# Patient Record
Sex: Male | Born: 1960 | Race: Black or African American | Hispanic: No | Marital: Single | Smoking: Never smoker
Health system: Southern US, Community
[De-identification: ages and names within clinical notes are randomized; demographics above are authoritative.]

## PROBLEM LIST (undated history)

## (undated) DIAGNOSIS — I5042 Chronic combined systolic (congestive) and diastolic (congestive) heart failure: Secondary | ICD-10-CM

## (undated) DIAGNOSIS — J151 Pneumonia due to Pseudomonas: Secondary | ICD-10-CM

## (undated) DIAGNOSIS — R011 Cardiac murmur, unspecified: Secondary | ICD-10-CM

## (undated) DIAGNOSIS — I359 Nonrheumatic aortic valve disorder, unspecified: Secondary | ICD-10-CM

## (undated) DIAGNOSIS — I4892 Unspecified atrial flutter: Secondary | ICD-10-CM

## (undated) DIAGNOSIS — E119 Type 2 diabetes mellitus without complications: Secondary | ICD-10-CM

## (undated) DIAGNOSIS — Z91199 Patient's noncompliance with other medical treatment and regimen due to unspecified reason: Secondary | ICD-10-CM

## (undated) DIAGNOSIS — R570 Cardiogenic shock: Secondary | ICD-10-CM

## (undated) DIAGNOSIS — N183 Chronic kidney disease, stage 3 unspecified: Secondary | ICD-10-CM

## (undated) DIAGNOSIS — J9600 Acute respiratory failure, unspecified whether with hypoxia or hypercapnia: Secondary | ICD-10-CM

## (undated) DIAGNOSIS — M109 Gout, unspecified: Secondary | ICD-10-CM

## (undated) DIAGNOSIS — K250 Acute gastric ulcer with hemorrhage: Secondary | ICD-10-CM

## (undated) DIAGNOSIS — D509 Iron deficiency anemia, unspecified: Secondary | ICD-10-CM

## (undated) DIAGNOSIS — E669 Obesity, unspecified: Secondary | ICD-10-CM

## (undated) DIAGNOSIS — Z9119 Patient's noncompliance with other medical treatment and regimen: Secondary | ICD-10-CM

## (undated) DIAGNOSIS — U071 COVID-19: Secondary | ICD-10-CM

## (undated) DIAGNOSIS — I10 Essential (primary) hypertension: Secondary | ICD-10-CM

## (undated) DIAGNOSIS — E785 Hyperlipidemia, unspecified: Secondary | ICD-10-CM

## (undated) DIAGNOSIS — I1310 Hypertensive heart and chronic kidney disease without heart failure, with stage 1 through stage 4 chronic kidney disease, or unspecified chronic kidney disease: Secondary | ICD-10-CM

## (undated) DIAGNOSIS — I272 Pulmonary hypertension, unspecified: Secondary | ICD-10-CM

## (undated) DIAGNOSIS — G4700 Insomnia, unspecified: Secondary | ICD-10-CM

## (undated) HISTORY — DX: Hypertensive heart and chronic kidney disease without heart failure, with stage 1 through stage 4 chronic kidney disease, or unspecified chronic kidney disease: I13.10

## (undated) HISTORY — DX: Hyperlipidemia, unspecified: E78.5

## (undated) HISTORY — DX: Patient's noncompliance with other medical treatment and regimen: Z91.19

## (undated) HISTORY — DX: Pulmonary hypertension, unspecified: I27.20

## (undated) HISTORY — DX: Essential (primary) hypertension: I10

## (undated) HISTORY — DX: Nonrheumatic aortic valve disorder, unspecified: I35.9

## (undated) HISTORY — DX: Chronic combined systolic (congestive) and diastolic (congestive) heart failure: I50.42

## (undated) HISTORY — DX: Chronic kidney disease, stage 3 unspecified: N18.30

## (undated) HISTORY — DX: Insomnia, unspecified: G47.00

## (undated) HISTORY — DX: Iron deficiency anemia, unspecified: D50.9

## (undated) HISTORY — DX: Obesity, unspecified: E66.9

## (undated) HISTORY — DX: Patient's noncompliance with other medical treatment and regimen due to unspecified reason: Z91.199

## (undated) HISTORY — DX: Unspecified atrial flutter: I48.92

## (undated) HISTORY — DX: Type 2 diabetes mellitus without complications: E11.9

## (undated) HISTORY — DX: Cardiogenic shock: R57.0

## (undated) HISTORY — DX: Cardiac murmur, unspecified: R01.1

## (undated) HISTORY — DX: Gout, unspecified: M10.9

---
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
## 2004-07-24 ENCOUNTER — Emergency Department (HOSPITAL_COMMUNITY): Admission: EM | Admit: 2004-07-24 | Discharge: 2004-07-24 | Payer: Self-pay | Admitting: Family Medicine

## 2004-09-29 ENCOUNTER — Ambulatory Visit (HOSPITAL_COMMUNITY): Admission: RE | Admit: 2004-09-29 | Discharge: 2004-09-29 | Payer: Self-pay | Admitting: Gastroenterology

## 2004-11-05 ENCOUNTER — Emergency Department (HOSPITAL_COMMUNITY): Admission: EM | Admit: 2004-11-05 | Discharge: 2004-11-05 | Payer: Self-pay | Admitting: Family Medicine

## 2013-09-09 ENCOUNTER — Ambulatory Visit: Payer: Self-pay | Admitting: Emergency Medicine

## 2014-11-17 ENCOUNTER — Encounter: Payer: Self-pay | Admitting: *Deleted

## 2014-11-19 ENCOUNTER — Encounter: Payer: Self-pay | Admitting: Cardiology

## 2014-11-19 ENCOUNTER — Ambulatory Visit (INDEPENDENT_AMBULATORY_CARE_PROVIDER_SITE_OTHER): Payer: Medicaid Other | Admitting: Cardiology

## 2014-11-19 VITALS — BP 150/80 | HR 74 | Ht 71.0 in | Wt 232.0 lb

## 2014-11-19 DIAGNOSIS — N189 Chronic kidney disease, unspecified: Secondary | ICD-10-CM

## 2014-11-19 DIAGNOSIS — I350 Nonrheumatic aortic (valve) stenosis: Secondary | ICD-10-CM

## 2014-11-19 DIAGNOSIS — I5022 Chronic systolic (congestive) heart failure: Secondary | ICD-10-CM | POA: Diagnosis not present

## 2014-11-19 DIAGNOSIS — I4892 Unspecified atrial flutter: Secondary | ICD-10-CM | POA: Diagnosis not present

## 2014-11-19 DIAGNOSIS — I10 Essential (primary) hypertension: Secondary | ICD-10-CM | POA: Diagnosis not present

## 2014-11-19 DIAGNOSIS — I420 Dilated cardiomyopathy: Secondary | ICD-10-CM

## 2014-11-19 DIAGNOSIS — N182 Chronic kidney disease, stage 2 (mild): Secondary | ICD-10-CM | POA: Insufficient documentation

## 2014-11-19 DIAGNOSIS — I351 Nonrheumatic aortic (valve) insufficiency: Secondary | ICD-10-CM | POA: Insufficient documentation

## 2014-11-19 DIAGNOSIS — R0602 Shortness of breath: Secondary | ICD-10-CM

## 2014-11-19 HISTORY — DX: Essential (primary) hypertension: I10

## 2014-11-19 LAB — CBC WITH DIFFERENTIAL/PLATELET
BASOS ABS: 0.1 10*3/uL (ref 0.0–0.1)
Basophils Relative: 0.6 % (ref 0.0–3.0)
EOS ABS: 0.2 10*3/uL (ref 0.0–0.7)
Eosinophils Relative: 1.9 % (ref 0.0–5.0)
HCT: 40.7 % (ref 39.0–52.0)
HEMOGLOBIN: 13.3 g/dL (ref 13.0–17.0)
Lymphocytes Relative: 14.4 % (ref 12.0–46.0)
Lymphs Abs: 1.5 10*3/uL (ref 0.7–4.0)
MCHC: 32.7 g/dL (ref 30.0–36.0)
MCV: 82.3 fl (ref 78.0–100.0)
MONOS PCT: 6.1 % (ref 3.0–12.0)
Monocytes Absolute: 0.7 10*3/uL (ref 0.1–1.0)
NEUTROS ABS: 8.3 10*3/uL — AB (ref 1.4–7.7)
Neutrophils Relative %: 77 % (ref 43.0–77.0)
PLATELETS: 411 10*3/uL — AB (ref 150.0–400.0)
RBC: 4.95 Mil/uL (ref 4.22–5.81)
RDW: 15.1 % (ref 11.5–15.5)
WBC: 10.8 10*3/uL — ABNORMAL HIGH (ref 4.0–10.5)

## 2014-11-19 LAB — BASIC METABOLIC PANEL
BUN: 10 mg/dL (ref 6–23)
CO2: 26 mEq/L (ref 19–32)
CREATININE: 1.3 mg/dL (ref 0.4–1.5)
Calcium: 9.1 mg/dL (ref 8.4–10.5)
Chloride: 101 mEq/L (ref 96–112)
GFR: 72.13 mL/min (ref >60.00)
GLUCOSE: 99 mg/dL (ref 70–99)
POTASSIUM: 3.6 meq/L (ref 3.5–5.1)
Sodium: 137 mEq/L (ref 135–145)

## 2014-11-19 LAB — BRAIN NATRIURETIC PEPTIDE: PRO B NATRI PEPTIDE: 837 pg/mL — AB (ref 0.0–100.0)

## 2014-11-19 MED ORDER — CARVEDILOL 25 MG PO TABS
25.0000 mg | ORAL_TABLET | Freq: Two times a day (BID) | ORAL | Status: DC
Start: 2014-11-19 — End: 2015-04-23

## 2014-11-19 NOTE — Progress Notes (Signed)
[address] [place], [place]  [address] Phone: [phone] Fax:  [phone]  Date:  11/19/2014   ID:  [name], DOB 12-31-1960, MRN [id number]  PCP:  No primary care provider on file.  Cardiologist:  Jonathon Him, MD    History of Present Illness: [name] is a 53 y.o. male with a history of CHF, atrial flutter, AV disease with AS, HTN, CKD and DCM who recently was seen at urgent care for increased SOB and CHF.  This past October he was admitted to a hospital in [place] and a BNP was 2522.  Those records are unavailable at the time of this OV.  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  He has never had a heart cath.  He was in [facility] in [place] as well.  He has been in [facility] several times.  He has never had a heart catheterization done. He says that his SOB has improved.  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  He denies any chest pain or pressure.  He denies any palpitations, dizziness or syncope.  He has 3 pillow orthopnea but no PND.   Wt Readings from Last 3 Encounters:  11/19/14 232 lb (105.235 kg)     Past Medical History  Diagnosis Date  . Insomnia   . CHF (congestive heart failure)   . Congenital insufficiency of aortic valve   . Congestive cardiomyopathy   . Atrial flutter   . Benign hypertensive heart and renal disease   . Essential hypertension, malignant   . SOB (shortness of breath)   . Murmur     Current Outpatient Prescriptions  Medication Sig Dispense Refill  . furosemide (LASIX) 40 MG tablet Take 40 mg by mouth 2 (two) times daily.    [name] albuterol (PROVENTIL HFA;VENTOLIN HFA) 108 (90 BASE) MCG/ACT inhaler Inhale 2 puffs into the lungs every 4 (four) hours as needed for wheezing or shortness of breath.    [name] aspirin 81 MG tablet Take 81 mg by mouth daily.    [name] atorvastatin (LIPITOR) 40 MG tablet Take 40 mg by mouth daily.    . carvedilol (COREG)  12.5 MG tablet Take 12.5 mg by mouth 2 (two) times daily with a meal.    . clopidogrel (PLAVIX) 75 MG tablet Take 75 mg by mouth daily.    . hydrALAZINE (APRESOLINE) 25 MG tablet Take 25 mg by mouth 3 (three) times daily.    [name] lisinopril (PRINIVIL,ZESTRIL) 10 MG tablet Take 10 mg by mouth 2 (two) times daily.    . ranitidine (ZANTAC) 150 MG capsule Take 150 mg by mouth daily.    . vitamin B-12 (CYANOCOBALAMIN) 500 MCG tablet Take 500 mcg by mouth daily.     No current facility-administered medications for this visit.    Allergies:   No Known Allergies  Social History:  The patient  reports that he has never smoked. He does not have any smokeless tobacco history on file.   Family History:  The patient's family history is negative for Heart attack.   ROS:  Please see the history of present illness.      All other systems reviewed and negative.   PHYSICAL EXAM: VS:  BP 150/80 mmHg  Pulse 74  Ht 5\' 11"  (1.803 m)  Wt 232 lb (105.235 kg)  BMI 32.37 kg/m2 Well nourished, well developed, in no  acute distress HEENT: normal Neck: no JVD Cardiac:  normal S1, S2; RRR;2/6 SM at RUSB to LLSB Lungs:  clear to auscultation bilaterally, no wheezing, rhonchi or rales Abd: soft, nontender, no hepatomegaly Ext: no edema Skin: warm and dry Neuro:  CNs 2-12 intact, no focal abnormalities noted  EKG:   Atrial flutter with variable block at 75bpm with LVH and QRS widening and repolarization abnormality    ASSESSMENT AND PLAN:  1. Atrial flutter rate controlled on Coreg.  He is on plavix and not a NOAC for unclear reasons.  His CHADS2VASC score is 2 (HTN and CHF).  I think he should be on a NOAC so I will check a NOAC panel today to determine his creatinine and also get his old records to try to figure out why he was not started on NOAC to begin with.  He has no history of bleeding problems. 2. HTN - borderline control.  Increase Coreg to 25mg  BID.  Continue Lisinopril/hydralazine 3. AV disease with  AS - I do not know the degree of AS and will need to get his hospital records for [place].  He clearly has a systolic murmur of AS on exam. 4. DCM and chronic systolic CHF - again I do not have any records and will need to get these to determine the etiology of this ( HTN vs. Valvular heart disease).  He continue to have SOB although improved on diuretics.  His lungs are clear and he has minimal LE edema on exam.  Continue lasix/BB and ACE I and check a BNP. 5. CKD - again I do not have records - will check BMET today.  Followup with me in 4 weeks    Signed, Jonathon Him, MD [facility] 11/19/2014 9:52 AM

## 2014-11-19 NOTE — Patient Instructions (Addendum)
Your physician has recommended you make the following change in your medication:  1) INCREASE Coreg to 25 mg twice daily  Your physician recommends that you have lab work TODAY (BNP, CBC, BMET).  Your physician recommends that you schedule a follow-up appointment in: 4 weeks with Dr. Radford Pax.

## 2014-11-20 ENCOUNTER — Telehealth: Payer: Self-pay

## 2014-11-20 DIAGNOSIS — I5022 Chronic systolic (congestive) heart failure: Secondary | ICD-10-CM

## 2014-11-20 MED ORDER — POTASSIUM CHLORIDE CRYS ER 20 MEQ PO TBCR
20.0000 meq | EXTENDED_RELEASE_TABLET | Freq: Every day | ORAL | Status: DC
Start: 2014-11-20 — End: 2018-12-10

## 2014-11-20 NOTE — Telephone Encounter (Signed)
[place] for medical records from [place], no medical records found at this time.  Ewell Poe RN

## 2014-11-20 NOTE — Telephone Encounter (Signed)
-----   Message from Sueanne Margarita, MD sent at 11/20/2014  9:55 AM EST ----- Please let patient know that his BNP is elevated consistent with acute CHF.  Please have him increase lasix to 40mg  PO 2 tablets twice daily for 3 days and then go back to 1 tablet BID. Start Kdur 31meq PO daily.   Recheck BNP and BMET in 1 week.  Please let him know his kidney function is normal.  Please find out if we have received the medical records from [place] that we requested yesterday to make sure there are no contraindications to starting patient on a NOAC for atrial flutter

## 2014-11-20 NOTE — Telephone Encounter (Signed)
Called patient back to inform him of his new medication orders. Patient stated "I can't afford to take any more medications. Why are you calling me so late?" Informed patient that Dr. Radford Pax had given orders at 1551 and he was contacted at 1553. Message was left to call office at that time. Patient was contacted again at 1720 due to the importance of medication changes and patient's condition. Patient sounded agitated and tired. I gave patient medication instructions as Dr. Radford Pax had written them: Increase lasix to 40 mg by mouth two tablets twice daily for three days and then go back to one tablet twice daily. Start Potassium 20 meq by mouth daily.  Patient was not agreeable to changes. Informed patient that Dr. Radford Pax wanted to recheck his labs in a week, and asked patient to come in next Thursday for labs. Patient stated "How am I going to get there? I have no car?" Patient continued to raise his voice and complain about cost. Apology was given to patient for waking him up, but patient was unconsolable. Asked patient to call office with any other questions. Sent patient's pharmacy the prescription for the potassium just in case he changes his mind about taking the medication.  Ewell Poe RN

## 2014-11-23 NOTE — Telephone Encounter (Signed)
Records here.  Will place with Dr. Theodosia Blender chart

## 2014-11-23 NOTE — Telephone Encounter (Signed)
We need a copy of his records today please!

## 2014-11-24 ENCOUNTER — Telehealth: Payer: Self-pay | Admitting: Cardiology

## 2014-11-24 NOTE — Telephone Encounter (Signed)
New Message  Pt called asking about lab work. Please call back and discuss.

## 2014-11-24 NOTE — Telephone Encounter (Signed)
ROI Faxed to [employer]

## 2014-11-24 NOTE — Telephone Encounter (Signed)
Please let patient know that I reviewed the records from [facility] where he presented with acute systolic CHF, acute hypoxic respiratory failure from pulmonary edema and acute renal failure.  He has a history of type II DM and HTN. .  His echo showed severely dilated LV with severe LV dysfunction EF 20%, moderate LVH, severe LAE, mild to moderate MR, bicuspid AV with severe AS/AR.  He was also started on coumadin for atrial flutter but this was stopped due to worries about noncompliance and he was started on Plavix.  He was also noted to have an elevated troponin on admission there and cardiology was consulted but he did not have a cath.  He was seen by cardiac surgery as well who recommended AVR but patient wanted to come back to [place] for this.  I have recommended that we set patient up for a right and left heart cath to assess for CAD prior to undergoing AVR. I will repeat an echo here for cardiac surgeons to review. I will stop his Plavix in preparation for need for AVR.  He will need to be placed on anticoagulants after surgery given his high CHADS2VASC score.  Continue ASA/Coreg/statin/lasix/ACE I.  I have attempted to call patient but could not get in touch with him.  Please call him to get this set up ASAP.

## 2014-11-25 NOTE — Telephone Encounter (Signed)
Left message to call back ASAP.

## 2014-11-25 NOTE — Telephone Encounter (Signed)
Addressed in lab notes

## 2014-11-26 ENCOUNTER — Other Ambulatory Visit (INDEPENDENT_AMBULATORY_CARE_PROVIDER_SITE_OTHER): Payer: Medicaid Other | Admitting: *Deleted

## 2014-11-26 ENCOUNTER — Other Ambulatory Visit: Payer: Self-pay

## 2014-11-26 DIAGNOSIS — Z01812 Encounter for preprocedural laboratory examination: Secondary | ICD-10-CM

## 2014-11-26 DIAGNOSIS — I420 Dilated cardiomyopathy: Secondary | ICD-10-CM

## 2014-11-26 DIAGNOSIS — R0602 Shortness of breath: Secondary | ICD-10-CM

## 2014-11-26 DIAGNOSIS — I350 Nonrheumatic aortic (valve) stenosis: Secondary | ICD-10-CM

## 2014-11-26 DIAGNOSIS — I5022 Chronic systolic (congestive) heart failure: Secondary | ICD-10-CM | POA: Diagnosis not present

## 2014-11-26 DIAGNOSIS — I4892 Unspecified atrial flutter: Secondary | ICD-10-CM

## 2014-11-26 LAB — BASIC METABOLIC PANEL
BUN: 19 mg/dL (ref 6–23)
CO2: 28 mEq/L (ref 19–32)
Calcium: 9.1 mg/dL (ref 8.4–10.5)
Chloride: 100 mEq/L (ref 96–112)
Creatinine, Ser: 1.6 mg/dL — ABNORMAL HIGH (ref 0.4–1.5)
GFR: 59.56 mL/min — AB (ref >60.00)
Glucose, Bld: 115 mg/dL — ABNORMAL HIGH (ref 70–99)
POTASSIUM: 4.6 meq/L (ref 3.5–5.1)
SODIUM: 135 meq/L (ref 135–145)

## 2014-11-26 LAB — CBC WITH DIFFERENTIAL/PLATELET
BASOS PCT: 0.7 % (ref 0.0–3.0)
Basophils Absolute: 0.1 10*3/uL (ref 0.0–0.1)
Eosinophils Absolute: 0.2 10*3/uL (ref 0.0–0.7)
Eosinophils Relative: 2.4 % (ref 0.0–5.0)
HCT: 38.3 % — ABNORMAL LOW (ref 39.0–52.0)
HEMOGLOBIN: 12.5 g/dL — AB (ref 13.0–17.0)
Lymphocytes Relative: 14.5 % (ref 12.0–46.0)
Lymphs Abs: 1.2 10*3/uL (ref 0.7–4.0)
MCHC: 32.7 g/dL (ref 30.0–36.0)
MCV: 81.3 fl (ref 78.0–100.0)
MONOS PCT: 5.9 % (ref 3.0–12.0)
Monocytes Absolute: 0.5 10*3/uL (ref 0.1–1.0)
NEUTROS ABS: 6.4 10*3/uL (ref 1.4–7.7)
Neutrophils Relative %: 76.5 % (ref 43.0–77.0)
Platelets: 296 10*3/uL (ref 150.0–400.0)
RBC: 4.72 Mil/uL (ref 4.22–5.81)
RDW: 15.6 % — ABNORMAL HIGH (ref 11.5–15.5)
WBC: 8.4 10*3/uL (ref 4.0–10.5)

## 2014-11-26 LAB — APTT: aPTT: 29.9 s (ref 23.4–32.7)

## 2014-11-26 LAB — PROTIME-INR
INR: 1.4 ratio — ABNORMAL HIGH (ref 0.8–1.0)
Prothrombin Time: 15.7 s — ABNORMAL HIGH (ref 9.6–13.1)

## 2014-11-26 LAB — BRAIN NATRIURETIC PEPTIDE: PRO B NATRI PEPTIDE: 776 pg/mL — AB (ref 0.0–100.0)

## 2014-11-26 NOTE — Telephone Encounter (Signed)
Late entry from 11/26/14 at 0900. Patient walked in the office this morning for lab work.  Brought patient to the nurse room to speak to him about catheterization after reviewing results from [place]. Had Dr. Radford Pax speak to patient about risks and benefits of heart cath. Patient understands urgent need for catheterization. Pre-procedure labs ordered and added to appointment made for today. Scheduled patient for R and L heart cath with Dr. Claiborne Billings tomorrow morning at 0730. Letter of instruction given to patient to arrive at 0530.  Patient agrees with treatment plan.

## 2014-11-27 ENCOUNTER — Encounter (HOSPITAL_COMMUNITY)
Admission: RE | Disposition: A | Discharge status: Home / Self Care | Payer: Self-pay | Source: Ambulatory Visit | Attending: Cardiovascular Disease

## 2014-11-27 ENCOUNTER — Observation Stay (HOSPITAL_COMMUNITY)
Admission: RE | Admit: 2014-11-27 | Discharge: 2014-11-28 | Disposition: A | Discharge status: Home / Self Care | Payer: Medicaid Other | Source: Ambulatory Visit | Attending: Cardiovascular Disease | Admitting: Cardiovascular Disease

## 2014-11-27 ENCOUNTER — Encounter (HOSPITAL_COMMUNITY): Payer: Self-pay | Admitting: Family Medicine

## 2014-11-27 DIAGNOSIS — I509 Heart failure, unspecified: Secondary | ICD-10-CM | POA: Diagnosis not present

## 2014-11-27 DIAGNOSIS — Z7982 Long term (current) use of aspirin: Secondary | ICD-10-CM | POA: Insufficient documentation

## 2014-11-27 DIAGNOSIS — Q231 Congenital insufficiency of aortic valve: Secondary | ICD-10-CM | POA: Insufficient documentation

## 2014-11-27 DIAGNOSIS — I350 Nonrheumatic aortic (valve) stenosis: Secondary | ICD-10-CM | POA: Diagnosis not present

## 2014-11-27 DIAGNOSIS — R531 Weakness: Secondary | ICD-10-CM | POA: Diagnosis present

## 2014-11-27 DIAGNOSIS — I483 Typical atrial flutter: Secondary | ICD-10-CM

## 2014-11-27 DIAGNOSIS — I5022 Chronic systolic (congestive) heart failure: Secondary | ICD-10-CM | POA: Insufficient documentation

## 2014-11-27 DIAGNOSIS — I4892 Unspecified atrial flutter: Secondary | ICD-10-CM | POA: Diagnosis not present

## 2014-11-27 DIAGNOSIS — I130 Hypertensive heart and chronic kidney disease with heart failure and stage 1 through stage 4 chronic kidney disease, or unspecified chronic kidney disease: Secondary | ICD-10-CM | POA: Insufficient documentation

## 2014-11-27 DIAGNOSIS — I2510 Atherosclerotic heart disease of native coronary artery without angina pectoris: Secondary | ICD-10-CM | POA: Insufficient documentation

## 2014-11-27 DIAGNOSIS — I359 Nonrheumatic aortic valve disorder, unspecified: Secondary | ICD-10-CM | POA: Diagnosis not present

## 2014-11-27 DIAGNOSIS — N182 Chronic kidney disease, stage 2 (mild): Secondary | ICD-10-CM

## 2014-11-27 DIAGNOSIS — N189 Chronic kidney disease, unspecified: Secondary | ICD-10-CM | POA: Insufficient documentation

## 2014-11-27 DIAGNOSIS — I420 Dilated cardiomyopathy: Secondary | ICD-10-CM | POA: Diagnosis not present

## 2014-11-27 HISTORY — PX: LEFT AND RIGHT HEART CATHETERIZATION WITH CORONARY ANGIOGRAM: SHX5449

## 2014-11-27 LAB — BASIC METABOLIC PANEL
ANION GAP: 15 (ref 5–15)
BUN: 20 mg/dL (ref 6–23)
CHLORIDE: 99 meq/L (ref 96–112)
CO2: 25 meq/L (ref 19–32)
Calcium: 9.4 mg/dL (ref 8.4–10.5)
Creatinine, Ser: 1.34 mg/dL (ref 0.50–1.35)
GFR calc Af Amer: 68 mL/min — ABNORMAL LOW (ref >90)
GFR calc non Af Amer: 59 mL/min — ABNORMAL LOW (ref >90)
Glucose, Bld: 115 mg/dL — ABNORMAL HIGH (ref 70–99)
POTASSIUM: 4.2 meq/L (ref 3.7–5.3)
SODIUM: 139 meq/L (ref 137–147)

## 2014-11-27 LAB — POCT I-STAT 3, VENOUS BLOOD GAS (G3P V)
BICARBONATE: 25.9 meq/L — AB (ref 20.0–24.0)
O2 SAT: 65 %
PO2 VEN: 36 mmHg (ref 30.0–45.0)
TCO2: 27 mmol/L (ref 0–100)
pCO2, Ven: 46.8 mmHg (ref 45.0–50.0)
pH, Ven: 7.35 — ABNORMAL HIGH (ref 7.250–7.300)

## 2014-11-27 LAB — POCT I-STAT 3, ART BLOOD GAS (G3+)
ACID-BASE DEFICIT: 2 mmol/L (ref 0.0–2.0)
Bicarbonate: 24 mEq/L (ref 20.0–24.0)
O2 SAT: 97 %
TCO2: 25 mmol/L (ref 0–100)
pCO2 arterial: 42.9 mmHg (ref 35.0–45.0)
pH, Arterial: 7.356 (ref 7.350–7.450)
pO2, Arterial: 93 mmHg (ref 80.0–100.0)

## 2014-11-27 SURGERY — LEFT AND RIGHT HEART CATHETERIZATION WITH CORONARY ANGIOGRAM
Anesthesia: LOCAL

## 2014-11-27 MED ORDER — ASPIRIN EC 81 MG PO TBEC
81.0000 mg | DELAYED_RELEASE_TABLET | Freq: Every day | ORAL | Status: DC
  Administered 2014-11-28: 81 mg via ORAL
  Filled 2014-11-27 (×2): qty 1

## 2014-11-27 MED ORDER — HEPARIN (PORCINE) IN NACL 2-0.9 UNIT/ML-% IJ SOLN
INTRAMUSCULAR | Status: AC
  Filled 2014-11-27: qty 1500

## 2014-11-27 MED ORDER — SODIUM CHLORIDE 0.9 % IV SOLN: INTRAVENOUS | Status: DC

## 2014-11-27 MED ORDER — ASPIRIN 81 MG PO CHEW
CHEWABLE_TABLET | ORAL | Status: AC
  Administered 2014-11-27: 81 mg via ORAL
  Filled 2014-11-27: qty 1

## 2014-11-27 MED ORDER — LISINOPRIL 10 MG PO TABS
10.0000 mg | ORAL_TABLET | Freq: Two times a day (BID) | ORAL | Status: DC
  Administered 2014-11-27 – 2014-11-28 (×3): 10 mg via ORAL
  Filled 2014-11-27 (×4): qty 1

## 2014-11-27 MED ORDER — ALPRAZOLAM 0.25 MG PO TABS: 0.2500 mg | ORAL_TABLET | Freq: Two times a day (BID) | ORAL | Status: DC | PRN

## 2014-11-27 MED ORDER — ASPIRIN 81 MG PO CHEW
81.0000 mg | CHEWABLE_TABLET | ORAL | Status: AC
  Administered 2014-11-27: 81 mg via ORAL

## 2014-11-27 MED ORDER — ALBUTEROL SULFATE (2.5 MG/3ML) 0.083% IN NEBU: 3.0000 mL | INHALATION_SOLUTION | RESPIRATORY_TRACT | Status: DC | PRN

## 2014-11-27 MED ORDER — SODIUM CHLORIDE 0.9 % IJ SOLN
3.0000 mL | Freq: Two times a day (BID) | INTRAMUSCULAR | Status: DC
Start: 2014-11-27 — End: 2014-11-27

## 2014-11-27 MED ORDER — FENTANYL CITRATE 0.05 MG/ML IJ SOLN
INTRAMUSCULAR | Status: AC
  Filled 2014-11-27: qty 2

## 2014-11-27 MED ORDER — ASPIRIN 81 MG PO TABS
81.0000 mg | ORAL_TABLET | Freq: Every day | ORAL | Status: DC
Start: 2014-11-27 — End: 2014-11-27

## 2014-11-27 MED ORDER — VITAMIN B-12 500 MCG PO TABS: 500.0000 ug | ORAL_TABLET | Freq: Every day | ORAL | Status: DC

## 2014-11-27 MED ORDER — ONDANSETRON HCL 4 MG/2ML IJ SOLN: 4.0000 mg | Freq: Four times a day (QID) | INTRAMUSCULAR | Status: DC | PRN

## 2014-11-27 MED ORDER — MIDAZOLAM HCL 2 MG/2ML IJ SOLN
INTRAMUSCULAR | Status: AC
  Filled 2014-11-27: qty 2

## 2014-11-27 MED ORDER — ZOLPIDEM TARTRATE 5 MG PO TABS: 5.0000 mg | ORAL_TABLET | Freq: Every evening | ORAL | Status: DC | PRN

## 2014-11-27 MED ORDER — ATORVASTATIN CALCIUM 40 MG PO TABS
40.0000 mg | ORAL_TABLET | Freq: Every day | ORAL | Status: DC
  Administered 2014-11-27 – 2014-11-28 (×2): 40 mg via ORAL
  Filled 2014-11-27 (×2): qty 1

## 2014-11-27 MED ORDER — NITROGLYCERIN 1 MG/10 ML FOR IR/CATH LAB
INTRA_ARTERIAL | Status: AC
  Filled 2014-11-27: qty 10

## 2014-11-27 MED ORDER — ENOXAPARIN SODIUM 40 MG/0.4ML ~~LOC~~ SOLN
40.0000 mg | SUBCUTANEOUS | Status: DC
  Administered 2014-11-27: 40 mg via SUBCUTANEOUS
  Filled 2014-11-27 (×2): qty 0.4

## 2014-11-27 MED ORDER — LIDOCAINE HCL (PF) 1 % IJ SOLN
INTRAMUSCULAR | Status: AC
  Filled 2014-11-27: qty 30

## 2014-11-27 MED ORDER — CARVEDILOL 25 MG PO TABS
25.0000 mg | ORAL_TABLET | Freq: Two times a day (BID) | ORAL | Status: DC
  Administered 2014-11-27 – 2014-11-28 (×2): 25 mg via ORAL
  Filled 2014-11-27 (×4): qty 1

## 2014-11-27 MED ORDER — ACETAMINOPHEN 325 MG PO TABS: 650.0000 mg | ORAL_TABLET | ORAL | Status: DC | PRN

## 2014-11-27 MED ORDER — CYANOCOBALAMIN 500 MCG PO TABS
500.0000 ug | ORAL_TABLET | Freq: Every day | ORAL | Status: DC
  Administered 2014-11-27 – 2014-11-28 (×2): 500 ug via ORAL
  Filled 2014-11-27 (×2): qty 1

## 2014-11-27 MED ORDER — CLOPIDOGREL BISULFATE 75 MG PO TABS
75.0000 mg | ORAL_TABLET | Freq: Every day | ORAL | Status: DC
  Administered 2014-11-27 – 2014-11-28 (×2): 75 mg via ORAL
  Filled 2014-11-27 (×2): qty 1

## 2014-11-27 MED ORDER — SODIUM CHLORIDE 0.9 % IV SOLN
INTRAVENOUS | Status: DC
  Administered 2014-11-27: 1000 mL via INTRAVENOUS

## 2014-11-27 MED ORDER — HYDRALAZINE HCL 25 MG PO TABS
25.0000 mg | ORAL_TABLET | Freq: Three times a day (TID) | ORAL | Status: DC
  Administered 2014-11-27 – 2014-11-28 (×3): 25 mg via ORAL
  Filled 2014-11-27 (×5): qty 1

## 2014-11-27 MED ORDER — SODIUM CHLORIDE 0.9 % IV SOLN: 250.0000 mL | INTRAVENOUS | Status: DC | PRN

## 2014-11-27 MED ORDER — SODIUM CHLORIDE 0.9 % IJ SOLN: 3.0000 mL | INTRAMUSCULAR | Status: DC | PRN

## 2014-11-27 MED ORDER — FAMOTIDINE 20 MG PO TABS
20.0000 mg | ORAL_TABLET | Freq: Every day | ORAL | Status: DC
Start: 2014-11-27 — End: 2014-11-28
  Administered 2014-11-27 – 2014-11-28 (×2): 20 mg via ORAL
  Filled 2014-11-27 (×2): qty 1

## 2014-11-27 NOTE — Progress Notes (Signed)
Site area: rt groin arterial and venous sheaths Site Prior to Removal:  Level 0 Pressure Applied For: 30 minutes Manual:   yes Patient Status During Pull:  stable Post Pull Site:  Level 0 Post Pull Instructions Given:  yes Post Pull Pulses Present: yes Dressing Applied:  tegaderm Bedrest begins @ 0940 Comments: no complications

## 2014-11-27 NOTE — CV Procedure (Addendum)
[name] is a 53 y.o. male   [id number]  [id number] LOCATION:  FACILITY: [facility]  PHYSICIAN: [name], MD, [facility] [birth year]   DATE OF PROCEDURE:  11/27/2014      RIGHT AND LEFT HEART CARDIAC CATHETERIZATION   HISTORY:  [name] is a 53 y.o. male who is referred by Dr. Golden Hurter for right and left heart catheterization.  The patient has a history of congestive heart failure.  He is a Administrator.  He was admitted to the hospital in [place], [place] and was felt to have severe LV dysfunction as well as aortic valve disease with aortic stenosis.  He denies any chest pain.  He was recently seen by Dr. Radford Pax and was in atrial flutter with rate control on carvedilol.  He has a history of renal insufficiency.   PROCEDURE:  Right and left heart catheterization: Swan-Ganz catheterization, cardiac Determination by the thermodilution and Fick method, coronary angiography, left ventriculography.  The patient was brought to the second floor [facility] cath lab in the postabsorptive state. Versed 2 mg and fentanyl 50 mcg were administered for conscious sedation. The right groin was prepped and draped in sterile fashion and a 5 [place] arterial sheath and 7 French venous sheath were inserted without difficulty. A Swan-Ganz catheter was advanced into the venous sheath and pressures were obtained in the right atrium, right ventricle, pulmonary artery, and pulmonary capillary wedge position. Cardiac outputs were obtained by the thermodilution and assumed Fick methods. Oxygen saturation was obtained in the pulmonary artery and aorta. A pigtail catheter was inserted and simultaneous AO/PA pressures were recorded. The pigtail catheter was advanced into the left ventricle and simultaneous left ventricular and PCW pressures were recorded. Left ventriculography was performed in the RAO projection.  A left ventricle to aorta pullback was performed. The pigtail catheter was then  removed and diagnostic catheterization to delineate the coronary anatomy was performed utilizing 5 [place] JL and JR 4 right diagnostic catheters. All catheters were removed and the patient. Hemostasis was obtained by direct manual pressure. The patient tolerated the procedure well and returned to his room in satisfactory condition.   HEMODYNAMICS:   RA: a 9 v 10  Mean 7 RV: 50/8 PA: 50/15 PC: a 17 v 23 mean 16 Initial Aortic pressure: 138/58 Simultaneous LV: 172/4/36;   FA: 149/ 54  Pullback: LV 197/7/48; AO 158/60  Simultaneous AO: 164/64;  FA 168/62  Oxygen saturation in the aorta 97% and the pulmonary artery 65%   Question accuracy of Cardiac output: 6.4 l/min (Thermo); 5.4 (Fick)                                       Cardiac index: 2.9 l/m/m2                2.4  ANGIOGRAPHY:   Fluoroscopy revealed calcification of the aortic valve.  The aorta was dilated and a Judkins 6 left catheter was necessary for selective angiography into the left coronary system.  Left main: Large angiographically normal vessel which trifurcated into a large LAD, a large ramus intermediate vessel, and a large left circumflex coronary artery.  LAD: Angiographically normal large vessel which gave rise to large proximal diagonal vessel.  Several septal perforating arteries  Ramus intermediate: Large angiographically normal vessel.   Left circumflex: Large angiographically normal vessel which gace rise to 2 marginal branches.  Right coronary artery: Large angiographically normal dominant vessel  Left ventriculography revealed a markedly dilated left ventricle.  Despite using 30 cc of Omnipaque contrast this did not adequately fill the ventricle.  Estimated ejection fraction is approximately 15 to less than 20%.    IMPRESSION:  Severe nonischemic dilated cardiomyopathy with EF estimate 15%.  Normal coronary arteries.  Calcified aortic valve with reduced excursion and Aortic Valve stenosis. Question  accuracy of hemodynamic data due to LV catheter whip.  Recommend 2-d echo to re-assess AVA area and AS severity.    [name], MD, [facility] 11/27/2014 9:34 AM

## 2014-11-27 NOTE — Progress Notes (Signed)
Pt had cardiac cath today.  Post-procedure was still feeling the effects of sedation, was a little weak.  BP 135/51 mmHg  Pulse 50  Temp(Src) 98 F (36.7 C) (Oral)  Resp 23  Ht 5\' 11"  (1.803 m)  Wt 230 lb (104.327 kg)  BMI 32.09 kg/m2  SpO2 97%  With low EF, careful monitoring of his volume status overnight and d/c in am if doing well was felt to be the best option. Orders written.  Rosaria Ferries, PA-C 11/27/2014 1:13 PM Beeper [phone].

## 2014-11-27 NOTE — Interval H&P Note (Signed)
History and Physical Interval Note:  11/27/2014 7:57 AM  [name]  has presented today for surgery, with the diagnosis of cad  The various methods of treatment have been discussed with the patient and family. After consideration of risks, benefits and other options for treatment, the patient has consented to  Procedure(s): LEFT AND RIGHT HEART CATHETERIZATION WITH CORONARY ANGIOGRAM (N/A) as a surgical intervention .  The patient's history has been reviewed, patient examined, no change in status, stable for surgery.  I have reviewed the patient's chart and labs.  Questions were answered to the patient's satisfaction.     Sherrin Stahle A

## 2014-11-27 NOTE — H&P (View-Only) (Signed)
[address], [place], [place]  [address] Phone: [phone] Fax:  [phone]  Date:  11/19/2014   ID:  [name], DOB 10/31/1961, MRN [id number]  PCP:  No primary care provider on file.  Cardiologist:  [name], MD    History of Present Illness: [name] is a 53 y.o. male with a history of CHF, atrial flutter, AV disease with AS, HTN, CKD and DCM who recently was seen at urgent care for increased SOB and CHF.  This past October he was admitted to a hospital in [place] and a BNP was 2522.  Those records are unavailable at the time of this OV.  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  He has never had a heart cath.  He was in [facility] in [place] as well.  He has been in [facility] several times.  He has never had a heart catheterization done. He says that his SOB has improved.  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  He denies any chest pain or pressure.  He denies any palpitations, dizziness or syncope.  He has 3 pillow orthopnea but no PND.   Wt Readings from Last 3 Encounters:  11/19/14 232 lb (105.235 kg)     Past Medical History  Diagnosis Date  . Insomnia   . CHF (congestive heart failure)   . Congenital insufficiency of aortic valve   . Congestive cardiomyopathy   . Atrial flutter   . Benign hypertensive heart and renal disease   . Essential hypertension, malignant   . SOB (shortness of breath)   . Murmur     Current Outpatient Prescriptions  Medication Sig Dispense Refill  . furosemide (LASIX) 40 MG tablet Take 40 mg by mouth 2 (two) times daily.    [name] albuterol (PROVENTIL HFA;VENTOLIN HFA) 108 (90 BASE) MCG/ACT inhaler Inhale 2 puffs into the lungs every 4 (four) hours as needed for wheezing or shortness of breath.    [name] aspirin 81 MG tablet Take 81 mg by mouth daily.    [name] atorvastatin (LIPITOR) 40 MG tablet Take 40 mg by mouth daily.    . carvedilol (COREG)  12.5 MG tablet Take 12.5 mg by mouth 2 (two) times daily with a meal.    . clopidogrel (PLAVIX) 75 MG tablet Take 75 mg by mouth daily.    . hydrALAZINE (APRESOLINE) 25 MG tablet Take 25 mg by mouth 3 (three) times daily.    [name] lisinopril (PRINIVIL,ZESTRIL) 10 MG tablet Take 10 mg by mouth 2 (two) times daily.    . ranitidine (ZANTAC) 150 MG capsule Take 150 mg by mouth daily.    . vitamin B-12 (CYANOCOBALAMIN) 500 MCG tablet Take 500 mcg by mouth daily.     No current facility-administered medications for this visit.    Allergies:   No Known Allergies  Social History:  The patient  reports that he has never smoked. He does not have any smokeless tobacco history on file.   Family History:  The patient's family history is negative for Heart attack.   ROS:  Please see the history of present illness.      All other systems reviewed and negative.   PHYSICAL EXAM: VS:  BP 150/80 mmHg  Pulse 74  Ht 5\' 11"  (1.803 m)  Wt 232 lb (105.235 kg)  BMI 32.37 kg/m2 Well nourished, well developed, in no  acute distress HEENT: normal Neck: no JVD Cardiac:  normal S1, S2; RRR;2/6 SM at RUSB to LLSB Lungs:  clear to auscultation bilaterally, no wheezing, rhonchi or rales Abd: soft, nontender, no hepatomegaly Ext: no edema Skin: warm and dry Neuro:  CNs 2-12 intact, no focal abnormalities noted  EKG:   Atrial flutter with variable block at 75bpm with LVH and QRS widening and repolarization abnormality    ASSESSMENT AND PLAN:  1. Atrial flutter rate controlled on Coreg.  He is on plavix and not a NOAC for unclear reasons.  His CHADS2VASC score is 2 (HTN and CHF).  I think he should be on a NOAC so I will check a NOAC panel today to determine his creatinine and also get his old records to try to figure out why he was not started on NOAC to begin with.  He has no history of bleeding problems. 2. HTN - borderline control.  Increase Coreg to 25mg  BID.  Continue Lisinopril/hydralazine 3. AV disease with  AS - I do not know the degree of AS and will need to get his hospital records for [place].  He clearly has a systolic murmur of AS on exam. 4. DCM and chronic systolic CHF - again I do not have any records and will need to get these to determine the etiology of this ( HTN vs. Valvular heart disease).  He continue to have SOB although improved on diuretics.  His lungs are clear and he has minimal LE edema on exam.  Continue lasix/BB and ACE I and check a BNP. 5. CKD - again I do not have records - will check BMET today.  Followup with me in 4 weeks    Signed, [name], MD [facility] 11/19/2014 9:52 AM

## 2014-11-27 NOTE — Progress Notes (Signed)
Client transferred to 2-w-13 via bed after report called to [place]

## 2014-11-27 NOTE — Progress Notes (Signed)
  Echocardiogram 2D Echocardiogram has been performed.  Donata Clay 11/27/2014, 11:53 AM

## 2014-11-28 DIAGNOSIS — I484 Atypical atrial flutter: Secondary | ICD-10-CM

## 2014-11-28 DIAGNOSIS — I429 Cardiomyopathy, unspecified: Secondary | ICD-10-CM

## 2014-11-28 DIAGNOSIS — I420 Dilated cardiomyopathy: Secondary | ICD-10-CM | POA: Diagnosis not present

## 2014-11-28 LAB — CBC
HCT: 35.7 % — ABNORMAL LOW (ref 39.0–52.0)
Hemoglobin: 11.5 g/dL — ABNORMAL LOW (ref 13.0–17.0)
MCH: 26.1 pg (ref 26.0–34.0)
MCHC: 32.2 g/dL (ref 30.0–36.0)
MCV: 81.1 fL (ref 78.0–100.0)
PLATELETS: 275 10*3/uL (ref 150–400)
RBC: 4.4 MIL/uL (ref 4.22–5.81)
RDW: 14.9 % (ref 11.5–15.5)
WBC: 7.4 10*3/uL (ref 4.0–10.5)

## 2014-11-28 LAB — BASIC METABOLIC PANEL
ANION GAP: 13 (ref 5–15)
BUN: 17 mg/dL (ref 6–23)
CALCIUM: 9.1 mg/dL (ref 8.4–10.5)
CO2: 24 mEq/L (ref 19–32)
CREATININE: 1.4 mg/dL — AB (ref 0.50–1.35)
Chloride: 103 mEq/L (ref 96–112)
GFR calc Af Amer: 65 mL/min — ABNORMAL LOW (ref >90)
GFR, EST NON AFRICAN AMERICAN: 56 mL/min — AB (ref >90)
Glucose, Bld: 130 mg/dL — ABNORMAL HIGH (ref 70–99)
Potassium: 4.4 mEq/L (ref 3.7–5.3)
SODIUM: 140 meq/L (ref 137–147)

## 2014-11-28 NOTE — Discharge Summary (Signed)
CARDIOLOGY DISCHARGE SUMMARY   Patient ID: [name] MRN: [id number] DOB/AGE: 12/24/1960 53 y.o.  Admit date: 11/27/2014 Discharge date: 11/28/2014  PCP: No primary care provider on file. Primary Cardiologist: Dr. Radford Pax  Primary Discharge Diagnosis:  Weakness, generalized Secondary Discharge Diagnosis:  Hypertension CK D Chronic systolic CHF  Procedures: Right and left heart catheterization: Swan-Ganz catheterization, cardiac Determination by the thermodilution and Fick method, coronary angiography, left ventriculography  Hospital Course: [name] is a 53 y.o. male with no history of CAD. He has been hospitalized several times for CHF. He was evaluated by Dr. Radford Pax and right/left heart catheterization was recommended. He was scheduled for the procedure and came to the hospital on 12/11.  Procedure results are below. He has severe nonischemic cardiomyopathy with an EF of 15%. His pulmonary pressures were not significantly elevated and his cardiac output was good. He was held overnight and monitored carefully.  On 12/12, he was seen by Dr. Ron Parker and all data were reviewed. His volume status was good. His cath sites were without ecchymosis or hematoma. His blood pressure was well-controlled. He was ambulating without chest pain or shortness of breath and considered stable for discharge, to follow up as an outpatient.  Labs:   Lab Results  Component Value Date   WBC 7.4 11/28/2014   HGB 11.5* 11/28/2014   HCT 35.7* 11/28/2014   MCV 81.1 11/28/2014   PLT 275 11/28/2014     Recent Labs Lab 11/28/14 0254  NA 140  K 4.4  CL 103  CO2 24  BUN 17  CREATININE 1.40*  CALCIUM 9.1  GLUCOSE 130*   PRO B NATRIURETIC PEPTIDE (BNP)  Date/Time Value Ref Range Status  11/26/2014 09:53 AM 776.0* 0.0 - 100.0 pg/mL Final  11/19/2014 10:33 AM 837.0* 0.0 - 100.0 pg/mL Final    Recent Labs  11/26/14 0953  INR 1.4*   Cardiac Cath: 11/27/2014 HEMODYNAMICS:    RA: a 9 v 10 Mean 7 RV: 50/8 PA: 50/15 PC: a 17 v 23 mean 16 Initial Aortic pressure: 138/58 Simultaneous LV: 172/4/36; FA: 149/ 54 Pullback: LV 197/7/48; AO 158/60 Simultaneous AO: 164/64; FA 168/62 Oxygen saturation in the aorta 97% and the pulmonary artery 65%  Question accuracy of Cardiac output: 6.4 l/min (Thermo); 5.4 (Fick)   Cardiac index: 2.9 l/m/m2 2.4 ANGIOGRAPHY:  Fluoroscopy revealed calcification of the aortic valve. The aorta was dilated and a Judkins 6 left catheter was necessary for selective angiography into the left coronary system. Left main: Large angiographically normal vessel which trifurcated into a large LAD, a large ramus intermediate vessel, and a large left circumflex coronary artery. LAD: Angiographically normal large vessel which gave rise to large proximal diagonal vessel. Several septal perforating arteries Ramus intermediate: Large angiographically normal vessel.  Left circumflex: Large angiographically normal vessel which gace rise to 2 marginal branches.  Right coronary artery: Large angiographically normal dominant vessel Left ventriculography revealed a markedly dilated left ventricle. Despite using 30 cc of Omnipaque contrast this did not adequately fill the ventricle. Estimated ejection fraction is approximately 15 to less than 20%.  IMPRESSION: Severe nonischemic dilated cardiomyopathy with EF estimate 15%. Normal coronary arteries. Calcified aortic valve with reduced excursion and Aortic Valve stenosis. Question accuracy of hemodynamic data due to LV catheter whip. Recommend 2-d echo to re-assess AVA area and AS severity.  Echo: 11/27/2014 - Left ventricle: The cavity size was severely dilated. Wall thickness was increased in a pattern of mild LVH. Systolic function was  moderately reduced. The estimated ejection fraction was in the range of 35% to 40%. Diffuse hypokinesis. -  Aortic valve: There is eccentric AI. This may be from prolapsing of one of the cusps. It is probably moderate. There is very mild AS. - Left atrium: The atrium was moderately dilated. - Right ventricle: The cavity size was mildly dilated. Systolic function was mildly reduced. - Right atrium: The atrium was moderately dilated.  FOLLOW UP PLANS AND APPOINTMENTS No Known Allergies   Medication List    TAKE these medications        albuterol 108 (90 BASE) MCG/ACT inhaler  Commonly known as:  PROVENTIL HFA;VENTOLIN HFA  Inhale 2 puffs into the lungs every 4 (four) hours as needed for wheezing or shortness of breath.     aspirin 81 MG tablet  Take 81 mg by mouth daily.     atorvastatin 40 MG tablet  Commonly known as:  LIPITOR  Take 40 mg by mouth daily.     carvedilol 25 MG tablet  Commonly known as:  COREG  Take 1 tablet (25 mg total) by mouth 2 (two) times daily with a meal.     clopidogrel 75 MG tablet  Commonly known as:  PLAVIX  Take 75 mg by mouth daily.     furosemide 40 MG tablet  Commonly known as:  LASIX  Take 40 mg by mouth 2 (two) times daily.     hydrALAZINE 25 MG tablet  Commonly known as:  APRESOLINE  Take 25 mg by mouth 3 (three) times daily.     lisinopril 10 MG tablet  Commonly known as:  PRINIVIL,ZESTRIL  Take 10 mg by mouth 2 (two) times daily.     potassium chloride SA 20 MEQ tablet  Commonly known as:  KLOR-CON M20  Take 1 tablet (20 mEq total) by mouth daily.     ranitidine 150 MG capsule  Commonly known as:  ZANTAC  Take 150 mg by mouth daily.     vitamin B-12 500 MCG tablet  Commonly known as:  CYANOCOBALAMIN  Take 500 mcg by mouth daily.        Discharge Instructions    (HEART FAILURE PATIENTS) Call MD:  Anytime you have any of the following symptoms: 1) 3 pound weight gain in 24 hours or 5 pounds in 1 week 2) shortness of breath, with or without a dry hacking cough 3) swelling in the hands, feet or stomach 4) if you have to sleep  on extra pillows at night in order to breathe.    Complete by:  As directed      Diet - low sodium heart healthy    Complete by:  As directed      Increase activity slowly    Complete by:  As directed           Follow-up Information    Follow up with Sueanne Margarita, MD.   Specialty:  Cardiology   Why:  The office will call.   Contact information:   [address] [place] [address] [phone]       BRING ALL MEDICATIONS WITH YOU TO FOLLOW UP APPOINTMENTS  Time spent with patient to include physician time: 31 min Signed: Rosaria Ferries, PA-C 11/28/2014, 10:03 AM Patient seen and examined. I agree with the assessment and plan as detailed above. See also my additional thoughts below.   I made the decision for the patient to go home. I agree with the discharge note.  Dola Argyle, MD, [facility] 11/29/2014 12:04 PM

## 2014-11-28 NOTE — Progress Notes (Signed)
Patient ID: [name], male   DOB: 1961/03/19, 53 y.o.   MRN: [id number]    SUBJECTIVE:  Cardiac catheterization was done yesterday. Patient has severe nonischemic cardiomyopathy. Echo was done also. There is aortic valvular disease. Aortic stenosis is very mild. 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 It is probably moderate. There may be prolapse of one leaflet of the aortic valve.  He feels well today. He is ready to leave the hospital. We will proceed with discharge.   Filed Vitals:   11/27/14 1622 11/27/14 2004 11/28/14 0006 11/28/14 0413  BP: 149/46 116/72 132/61 158/66  Pulse: 72 55 50 51  Temp: 97.3 F (36.3 C) 98.7 F (37.1 C) 98.4 F (36.9 C) 97.6 F (36.4 C)  TempSrc: Oral Oral Oral Oral  Resp: 19 20 18 18   Height:      Weight:      SpO2: 96% 100% 97% 99%     Intake/Output Summary (Last 24 hours) at 11/28/14 0936 Last data filed at 11/28/14 0933  Gross per 24 hour  Intake    720 ml  Output    500 ml  Net    220 ml    LABS: Basic Metabolic Panel:  Recent Labs  11/27/14 0600 11/28/14 0254  NA 139 140  K 4.2 4.4  CL 99 103  CO2 25 24  GLUCOSE 115* 130*  BUN 20 17  CREATININE 1.34 1.40*  CALCIUM 9.4 9.1   Liver Function Tests: No results for input(s): AST, ALT, ALKPHOS, BILITOT, PROT, ALBUMIN in the last 72 hours. No results for input(s): LIPASE, AMYLASE in the last 72 hours. CBC:  Recent Labs  11/26/14 0953 11/28/14 0254  WBC 8.4 7.4  NEUTROABS 6.4  --   HGB 12.5* 11.5*  HCT 38.3* 35.7*  MCV 81.3 81.1  PLT 296.0 275   Cardiac Enzymes: No results for input(s): CKTOTAL, CKMB, CKMBINDEX, TROPONINI in the last 72 hours. BNP: Invalid input(s): POCBNP D-Dimer: No results for input(s): DDIMER in the last 72 hours. Hemoglobin A1C: No results for input(s): HGBA1C in the last 72 hours. Fasting Lipid Panel: No results for input(s): CHOL, HDL, LDLCALC, TRIG, CHOLHDL, LDLDIRECT in the last 72 hours. Thyroid Function Tests: No results for  input(s): TSH, T4TOTAL, T3FREE, THYROIDAB in the last 72 hours.  Invalid input(s): FREET3  RADIOLOGY: No results found.  PHYSICAL EXAM   patient is stable. He is oriented to person time and place. Cath site in the right groin is stable. Lungs are clear. Respiratory effort is not labored. Cardiac exam is S1 and S2.   TELEMETRY: I have reviewed telemetry today November 28, 2014.   ASSESSMENT AND PLAN:  Chronic atrial flutter    Up to this point the patient has not been anticoagulated. Final decisions about his medications need to be made at an outpatient visit following this hospitalization with Dr. Radford Pax.  Severe nonischemic cardiomyopathy    Catheterization reveals severe nonischemic cardiomyopathy. He will have to be assessed further over time to decide if a device is appropriate. He is on carvedilol and hydralazine and an ACE inhibitor.  Aortic stenosis.... Mild Aortic insufficiency... Eccentric... Moderate  Plan is for discharge this morning with outpatient follow-up with Dr. Radford Pax.   Dola Argyle 11/28/2014 9:36 AM

## 2014-11-28 NOTE — Discharge Instructions (Signed)
PLEASE REMEMBER TO BRING ALL OF YOUR MEDICATIONS TO EACH OF YOUR FOLLOW-UP OFFICE VISITS. ° °PLEASE ATTEND ALL SCHEDULED FOLLOW-UP APPOINTMENTS.  ° °Activity: Increase activity slowly as tolerated. You may shower, but no soaking baths (or swimming) for 1 week. No driving for 2 days. No lifting over 5 lbs for 1 week. No sexual activity for 1 week.  ° °You May Return to Work: in 1 week (if applicable) ° °Wound Care: You may wash cath site gently with soap and water. Keep cath site clean and dry. If you notice pain, swelling, bleeding or pus at your cath site, please call [phone]. ° ° ° °Cardiac Cath Site Care °Refer to this sheet in the next few weeks. These instructions provide you with information on caring for yourself after your procedure. Your caregiver may also give you more specific instructions. Your treatment has been planned according to current medical practices, but problems sometimes occur. Call your caregiver if you have any problems or questions after your procedure. °HOME CARE INSTRUCTIONS °· You may shower 24 hours after the procedure. Remove the bandage (dressing) and gently wash the site with plain soap and water. Gently pat the site dry.  °· Do not apply powder or lotion to the site.  °· Do not sit in a bathtub, swimming pool, or whirlpool for 5 to 7 days.  °· No bending, squatting, or lifting anything over 10 pounds (4.5 kg) as directed by your caregiver.  °· Inspect the site at least twice daily.  °· Do not drive home if you are discharged the same day of the procedure. Have someone else drive you.  °· You may drive 24 hours after the procedure unless otherwise instructed by your caregiver.  °What to expect: °· Any bruising will usually fade within 1 to 2 weeks.  °· Blood that collects in the tissue (hematoma) may be painful to the touch. It should usually decrease in size and tenderness within 1 to 2 weeks.  °SEEK IMMEDIATE MEDICAL CARE IF: °· You have unusual pain at the site or down the  affected limb.  °· You have redness, warmth, swelling, or pain at the site.  °· You have drainage (other than a small amount of blood on the dressing).  °· You have chills.  °· You have a fever or persistent symptoms for more than 72 hours.  °· You have a fever and your symptoms suddenly get worse.  °· Your leg becomes pale, cool, tingly, or numb.  °· You have heavy bleeding from the site. Hold pressure on the site.  °Document Released: 01/06/2011 Document Revised: 11/23/2011 Document Reviewed: 01/06/2011 °ExitCare® Patient Information ©2012 [employer]. ° °

## 2014-11-28 NOTE — Progress Notes (Signed)
UR completed 

## 2014-11-30 ENCOUNTER — Telehealth: Payer: Self-pay | Admitting: Cardiology

## 2014-11-30 DIAGNOSIS — I5022 Chronic systolic (congestive) heart failure: Secondary | ICD-10-CM

## 2014-11-30 NOTE — Telephone Encounter (Signed)
New message      I called this pt to schedule him a hosp f/u in two weeks per Suanne Marker.  Pt has an appt already scheduled for January.  He want a nurse to call him back.  Appt for 12-14-14 was not scheduled.

## 2014-11-30 NOTE — Telephone Encounter (Signed)
Left message to call back  

## 2014-12-01 ENCOUNTER — Telehealth: Payer: Self-pay | Admitting: Cardiovascular Disease

## 2014-12-01 NOTE — Telephone Encounter (Signed)
Returned call to patient no answer.LMTC. 

## 2014-12-01 NOTE — Telephone Encounter (Signed)
Instructed patient to STOP lisinopril and INCREASE hydralazine to 37.5 mg (1.5 tablets) TID.   Patient concerned because he st "his cath was clear, and we made him think he was dying." He is adamant about getting cleared for work ASAP as he lost his job, is on [employer], and is staying at friends' houses.  To Dr. Radford Pax for review and recommendations.

## 2014-12-01 NOTE — Telephone Encounter (Signed)
Pt had cath on Friday,bruised where he had procedure,and his left side.

## 2014-12-01 NOTE — Telephone Encounter (Signed)
-----   Message from Sueanne Margarita, MD sent at 11/26/2014  4:52 PM EST ----- Creatinine elevated.  Please have patient stop Lisinopril and increase Hydralazine to 37.5mg  TID.  Repeat BMET in am at time of cath and tell cath lab not to do LV gram

## 2014-12-01 NOTE — Telephone Encounter (Signed)
What kind of work does he do?

## 2014-12-02 ENCOUNTER — Telehealth: Payer: Self-pay | Admitting: *Deleted

## 2014-12-02 NOTE — Telephone Encounter (Signed)
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  He can see Dr. Maylon Peppers who is one of the DOT MDs

## 2014-12-02 NOTE — Telephone Encounter (Signed)
Left message for patient to call back  

## 2014-12-02 NOTE — Telephone Encounter (Signed)
Spoke to patient Patient was concerned he has bruising on both right and left groin. Patient states no discomfort, no drainage, no swelling, no warmth at the area.no abd pain, Patient wanted to know if this normal. RN informed patient it can happen,if he has none of the above symptoms continue to monitor.  Patient states he wants to know when he can resume exercising and return back to work. 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 RN  Informed he has an appointment with Dr Radford Pax on 12/23/13. Patient states he can not wait that long to find out  About working. RN informed will defer to Dr Radford Pax and her primary nurse

## 2014-12-03 NOTE — Telephone Encounter (Addendum)
Called patient back.He wanted to know if he could start to exercise again. 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 He also mentioned that Dr.Kelly "told me my heart was OK". Advised him to keep appointment with Dr.Turner next month. He wants to speak with her tomorrow. Advised will forward message to Dr.Turner and Lenice Llamas RN for follow up on 12/18.

## 2014-12-03 NOTE — Telephone Encounter (Signed)
New Prob    Pt is wanting to restart physical activity and requesting to speak to a nurse about this, Please call.

## 2014-12-03 NOTE — Telephone Encounter (Signed)
Spoke to patient on 12/02/14 Routed information to Dr Radford Pax

## 2014-12-07 NOTE — Telephone Encounter (Signed)
Left message to call back  

## 2014-12-08 NOTE — Telephone Encounter (Signed)
Patient had normal coronary arteries at cath.  He has a nonischemic DCM with EF 15% at cath and echo showed EF 35-40%.  Please set up cardiac MRI to assess EF since there is a discrepancy.  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

## 2014-12-09 NOTE — Telephone Encounter (Signed)
Instructed patient he will have to be cleared to work by a DOT physician.  Patient st he will not do any further testing, as Dr. Claiborne Billings told him his heart is fine. He refuses cardiac MRI. Patient st he also did not start his potassium because he does not have money to purchase it.  Pt st he is still planning to come to his Jan. 6th OV with Dr. Radford Pax.

## 2014-12-09 NOTE — Telephone Encounter (Signed)
Left message to call back  

## 2014-12-09 NOTE — Telephone Encounter (Signed)
Follow up ° ° ° ° ° °Returning Katy's call °

## 2014-12-14 NOTE — Telephone Encounter (Signed)
Please let him know that What Dr. Claiborne Billings meant by heart being fine was that there were no blockages.  He has significant LV dysfunction.  If he will not have the MRI then I need a MUGA scan to determine an accurate assessment of his EF

## 2014-12-17 NOTE — Telephone Encounter (Signed)
Patient agrees to Dr. Theodosia Blender recommendation for MUGA scan.   Ordered for scheduling.

## 2014-12-17 NOTE — Addendum Note (Signed)
Addended by: Harland German A on: 12/17/2014 01:56 PM   Modules accepted: Orders

## 2014-12-17 NOTE — Telephone Encounter (Signed)
Left message to call back  

## 2014-12-23 ENCOUNTER — Other Ambulatory Visit (HOSPITAL_COMMUNITY): Payer: Self-pay

## 2014-12-23 ENCOUNTER — Ambulatory Visit: Payer: Self-pay | Admitting: Cardiology

## 2015-01-05 ENCOUNTER — Telehealth: Payer: Self-pay

## 2015-01-05 NOTE — Telephone Encounter (Signed)
Left message to call back per [facility]. Pt was upset earlier and called to follow-up. Will try again later.

## 2015-01-07 ENCOUNTER — Telehealth: Payer: Self-pay | Admitting: Cardiology

## 2015-01-07 NOTE — Telephone Encounter (Signed)
New Message      Patient needs to know his medication list and to know what meds he should be taking.  Please give patient a call.

## 2015-01-07 NOTE — Telephone Encounter (Signed)
Reviewed medication list with patient. Patient grateful for call.

## 2015-01-11 ENCOUNTER — Telehealth: Payer: Self-pay | Admitting: Cardiology

## 2015-01-11 NOTE — Telephone Encounter (Signed)
New Message  Pt called back. Request to speak with Dr. Landis Gandy nurse

## 2015-01-11 NOTE — Telephone Encounter (Signed)
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  Patient EXTREMELY angry because he st he was never told his EF was lower than this.  Explained to patient that Dr. Radford Pax and I have both talked to him about his weak pumping function multiple times.  Informed patient that his MUGA study on Feb. 10 will show an accurate EF and whether or not it has improved.  Patient frustrated he has to wait until 2/10.  Message sent to [facility] to see if it can be arranged earlier.

## 2015-01-11 NOTE — Telephone Encounter (Signed)
New problem   Pt need to speak to nurse concerning him getting his DOT physical. He is at [place] trying to get a physical and Pt need a clearance letter fax to [phone] Attn:[facility]. Please call pt b/f letter is written.

## 2015-01-13 ENCOUNTER — Ambulatory Visit (HOSPITAL_COMMUNITY): Payer: Medicaid Other | Attending: Cardiovascular Disease | Admitting: Radiology

## 2015-01-13 DIAGNOSIS — I350 Nonrheumatic aortic (valve) stenosis: Secondary | ICD-10-CM | POA: Diagnosis not present

## 2015-01-13 DIAGNOSIS — I428 Other cardiomyopathies: Secondary | ICD-10-CM | POA: Diagnosis not present

## 2015-01-13 DIAGNOSIS — I420 Dilated cardiomyopathy: Secondary | ICD-10-CM | POA: Diagnosis not present

## 2015-01-13 DIAGNOSIS — Z0289 Encounter for other administrative examinations: Secondary | ICD-10-CM | POA: Diagnosis not present

## 2015-01-13 DIAGNOSIS — I5022 Chronic systolic (congestive) heart failure: Secondary | ICD-10-CM

## 2015-01-13 MED ORDER — TECHNETIUM TC 99M-LABELED RED BLOOD CELLS IV KIT
25.0000 | PACK | Freq: Once | INTRAVENOUS | Status: AC | PRN
  Administered 2015-01-13: 25 via INTRAVENOUS

## 2015-01-13 NOTE — Progress Notes (Signed)
Muga Study  Referring Provider:  Sueanne Margarita, MD  Date of Procedure: 01/13/2015  Indication: DOT clearance. Patient has a history of Non-ischemicCardiomyopathy, EF-15% per recent cath, mild AS. 22G IV started in R hand, site unremarkable.  Muga Information:  The patient's red blood cells were labeled using the Ultra Tag method with 25 mci of Technetium 66m Pertechnetate.  The images were reconstructed in the Anterior, Lateral and Left Anterior oblique views.  Impression: Moderately dilated LV cavity with EF calculated at 35%  Signed: [name], MD [facility] 01/13/2015

## 2015-01-14 ENCOUNTER — Telehealth: Payer: Self-pay | Admitting: Cardiology

## 2015-01-14 NOTE — Telephone Encounter (Signed)
New message    Patient calling  Checking on status of test results & note to return work.

## 2015-01-14 NOTE — Telephone Encounter (Signed)
Informed patient of MUGA scan results.  Patient extremely dissatisfied with EF, stating that Dr. Radford Pax read the test incorrectly.  Informed patient study results will be sent to [facility] per patient request.  Placed in Medical Records "to be faxed" bin.

## 2015-01-27 ENCOUNTER — Ambulatory Visit: Payer: Self-pay | Admitting: Cardiology

## 2015-01-27 ENCOUNTER — Other Ambulatory Visit (HOSPITAL_COMMUNITY): Payer: Self-pay

## 2015-02-08 ENCOUNTER — Telehealth: Payer: Self-pay | Admitting: Cardiology

## 2015-02-08 NOTE — Telephone Encounter (Signed)
New message   Pt c/o medication issue:  1. Name of Medication: hydralazine  25 mg  2. How are you currently taking this medication (dosage and times per day)? 3 time a day . Am / noon / pm   3. Are you having a reaction (difficulty breathing--STAT)? Side effect color of skin   4. What is your medication issue? Should medication be change

## 2015-02-08 NOTE — Telephone Encounter (Signed)
Patient blaming hydralazine for sluggishness, diarrhea, constipation, and skin pigment darkening. When asked how he knows these symptoms are caused by the hydralazine, he st his wife told him these are all side effects.  He requests Dr. Radford Pax to change the medication.

## 2015-02-08 NOTE — Telephone Encounter (Signed)
I do not think his symptoms are related to hydralazine - please have him see his PCP

## 2015-02-10 NOTE — Telephone Encounter (Signed)
Left message to call back  

## 2015-02-10 NOTE — Telephone Encounter (Signed)
Instructed patient to see his PCP.  Patient agrees with treatment plan.

## 2015-02-15 ENCOUNTER — Telehealth: Payer: Self-pay | Admitting: Cardiology

## 2015-02-15 NOTE — Telephone Encounter (Signed)
Patient requesting note from Dr. 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 Patient annoyed, but agrees.  Instructed patient to call if he needs anything else.

## 2015-02-15 NOTE — Telephone Encounter (Signed)
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  Please call

## 2015-03-27 ENCOUNTER — Emergency Department (HOSPITAL_COMMUNITY): Payer: Medicaid Other

## 2015-03-27 ENCOUNTER — Encounter (HOSPITAL_COMMUNITY): Payer: Self-pay | Admitting: *Deleted

## 2015-03-27 ENCOUNTER — Inpatient Hospital Stay (HOSPITAL_COMMUNITY)
Admission: EM | Admit: 2015-03-27 | Discharge: 2015-04-23 | DRG: 219 | Disposition: A | Discharge status: Skilled Nursing Facility | Payer: Medicaid Other | Attending: Cardiothoracic Surgery | Admitting: Cardiothoracic Surgery

## 2015-03-27 ENCOUNTER — Other Ambulatory Visit (HOSPITAL_COMMUNITY): Payer: Self-pay

## 2015-03-27 DIAGNOSIS — K219 Gastro-esophageal reflux disease without esophagitis: Secondary | ICD-10-CM | POA: Diagnosis present

## 2015-03-27 DIAGNOSIS — N183 Chronic kidney disease, stage 3 (moderate): Secondary | ICD-10-CM | POA: Diagnosis present

## 2015-03-27 DIAGNOSIS — I351 Nonrheumatic aortic (valve) insufficiency: Secondary | ICD-10-CM | POA: Insufficient documentation

## 2015-03-27 DIAGNOSIS — I5021 Acute systolic (congestive) heart failure: Secondary | ICD-10-CM | POA: Diagnosis present

## 2015-03-27 DIAGNOSIS — R1310 Dysphagia, unspecified: Secondary | ICD-10-CM | POA: Diagnosis present

## 2015-03-27 DIAGNOSIS — M264 Malocclusion, unspecified: Secondary | ICD-10-CM | POA: Diagnosis present

## 2015-03-27 DIAGNOSIS — I10 Essential (primary) hypertension: Secondary | ICD-10-CM | POA: Diagnosis present

## 2015-03-27 DIAGNOSIS — Z79899 Other long term (current) drug therapy: Secondary | ICD-10-CM

## 2015-03-27 DIAGNOSIS — Z7982 Long term (current) use of aspirin: Secondary | ICD-10-CM | POA: Diagnosis not present

## 2015-03-27 DIAGNOSIS — I4892 Unspecified atrial flutter: Secondary | ICD-10-CM | POA: Diagnosis present

## 2015-03-27 DIAGNOSIS — R7989 Other specified abnormal findings of blood chemistry: Secondary | ICD-10-CM

## 2015-03-27 DIAGNOSIS — I248 Other forms of acute ischemic heart disease: Secondary | ICD-10-CM | POA: Diagnosis present

## 2015-03-27 DIAGNOSIS — R32 Unspecified urinary incontinence: Secondary | ICD-10-CM | POA: Diagnosis present

## 2015-03-27 DIAGNOSIS — I350 Nonrheumatic aortic (valve) stenosis: Secondary | ICD-10-CM

## 2015-03-27 DIAGNOSIS — I4891 Unspecified atrial fibrillation: Secondary | ICD-10-CM | POA: Diagnosis present

## 2015-03-27 DIAGNOSIS — J690 Pneumonitis due to inhalation of food and vomit: Secondary | ICD-10-CM | POA: Diagnosis not present

## 2015-03-27 DIAGNOSIS — I5023 Acute on chronic systolic (congestive) heart failure: Principal | ICD-10-CM | POA: Diagnosis present

## 2015-03-27 DIAGNOSIS — E1165 Type 2 diabetes mellitus with hyperglycemia: Secondary | ICD-10-CM | POA: Diagnosis present

## 2015-03-27 DIAGNOSIS — I313 Pericardial effusion (noninflammatory): Secondary | ICD-10-CM | POA: Diagnosis not present

## 2015-03-27 DIAGNOSIS — K449 Diaphragmatic hernia without obstruction or gangrene: Secondary | ICD-10-CM | POA: Diagnosis present

## 2015-03-27 DIAGNOSIS — G4700 Insomnia, unspecified: Secondary | ICD-10-CM | POA: Diagnosis present

## 2015-03-27 DIAGNOSIS — Z01811 Encounter for preprocedural respiratory examination: Secondary | ICD-10-CM

## 2015-03-27 DIAGNOSIS — Z952 Presence of prosthetic heart valve: Secondary | ICD-10-CM

## 2015-03-27 DIAGNOSIS — I359 Nonrheumatic aortic valve disorder, unspecified: Secondary | ICD-10-CM | POA: Diagnosis not present

## 2015-03-27 DIAGNOSIS — I420 Dilated cardiomyopathy: Secondary | ICD-10-CM | POA: Diagnosis present

## 2015-03-27 DIAGNOSIS — R609 Edema, unspecified: Secondary | ICD-10-CM | POA: Diagnosis not present

## 2015-03-27 DIAGNOSIS — K0530 Chronic periodontitis, unspecified: Secondary | ICD-10-CM | POA: Diagnosis present

## 2015-03-27 DIAGNOSIS — Z9119 Patient's noncompliance with other medical treatment and regimen: Secondary | ICD-10-CM | POA: Diagnosis present

## 2015-03-27 DIAGNOSIS — I352 Nonrheumatic aortic (valve) stenosis with insufficiency: Secondary | ICD-10-CM | POA: Diagnosis present

## 2015-03-27 DIAGNOSIS — K060 Gingival recession: Secondary | ICD-10-CM | POA: Diagnosis present

## 2015-03-27 DIAGNOSIS — I130 Hypertensive heart and chronic kidney disease with heart failure and stage 1 through stage 4 chronic kidney disease, or unspecified chronic kidney disease: Secondary | ICD-10-CM | POA: Diagnosis present

## 2015-03-27 DIAGNOSIS — I361 Nonrheumatic tricuspid (valve) insufficiency: Secondary | ICD-10-CM | POA: Diagnosis not present

## 2015-03-27 DIAGNOSIS — Q231 Congenital insufficiency of aortic valve: Secondary | ICD-10-CM

## 2015-03-27 DIAGNOSIS — R0602 Shortness of breath: Secondary | ICD-10-CM | POA: Diagnosis present

## 2015-03-27 DIAGNOSIS — N182 Chronic kidney disease, stage 2 (mild): Secondary | ICD-10-CM | POA: Diagnosis not present

## 2015-03-27 DIAGNOSIS — Z7951 Long term (current) use of inhaled steroids: Secondary | ICD-10-CM

## 2015-03-27 DIAGNOSIS — Z419 Encounter for procedure for purposes other than remedying health state, unspecified: Secondary | ICD-10-CM

## 2015-03-27 DIAGNOSIS — I509 Heart failure, unspecified: Secondary | ICD-10-CM

## 2015-03-27 DIAGNOSIS — R0902 Hypoxemia: Secondary | ICD-10-CM | POA: Diagnosis present

## 2015-03-27 DIAGNOSIS — J3801 Paralysis of vocal cords and larynx, unilateral: Secondary | ICD-10-CM | POA: Diagnosis present

## 2015-03-27 DIAGNOSIS — Z6831 Body mass index (BMI) 31.0-31.9, adult: Secondary | ICD-10-CM

## 2015-03-27 DIAGNOSIS — I712 Thoracic aortic aneurysm, without rupture: Secondary | ICD-10-CM | POA: Diagnosis present

## 2015-03-27 DIAGNOSIS — R778 Other specified abnormalities of plasma proteins: Secondary | ICD-10-CM

## 2015-03-27 DIAGNOSIS — I483 Typical atrial flutter: Secondary | ICD-10-CM | POA: Diagnosis not present

## 2015-03-27 DIAGNOSIS — R509 Fever, unspecified: Secondary | ICD-10-CM

## 2015-03-27 DIAGNOSIS — I711 Thoracic aortic aneurysm, ruptured: Secondary | ICD-10-CM | POA: Diagnosis not present

## 2015-03-27 LAB — CBC WITH DIFFERENTIAL/PLATELET
BASOS PCT: 1 % (ref 0–1)
Basophils Absolute: 0 10*3/uL (ref 0.0–0.1)
EOS ABS: 0 10*3/uL (ref 0.0–0.7)
EOS PCT: 0 % (ref 0–5)
HCT: 43.8 % (ref 39.0–52.0)
Hemoglobin: 14.5 g/dL (ref 13.0–17.0)
LYMPHS ABS: 1.8 10*3/uL (ref 0.7–4.0)
LYMPHS PCT: 24 % (ref 12–46)
MCH: 27.1 pg (ref 26.0–34.0)
MCHC: 33.1 g/dL (ref 30.0–36.0)
MCV: 81.7 fL (ref 78.0–100.0)
Monocytes Absolute: 0.3 10*3/uL (ref 0.1–1.0)
Monocytes Relative: 4 % (ref 3–12)
NEUTROS ABS: 5.6 10*3/uL (ref 1.7–7.7)
Neutrophils Relative %: 71 % (ref 43–77)
Platelets: 240 10*3/uL (ref 150–400)
RBC: 5.36 MIL/uL (ref 4.22–5.81)
RDW: 15.3 % (ref 11.5–15.5)
WBC: 7.8 10*3/uL (ref 4.0–10.5)

## 2015-03-27 LAB — BASIC METABOLIC PANEL
ANION GAP: 13 (ref 5–15)
BUN: 20 mg/dL (ref 6–23)
CHLORIDE: 105 mmol/L (ref 96–112)
CO2: 19 mmol/L (ref 19–32)
Calcium: 9.2 mg/dL (ref 8.4–10.5)
Creatinine, Ser: 1.44 mg/dL — ABNORMAL HIGH (ref 0.50–1.35)
GFR calc Af Amer: 62 mL/min — ABNORMAL LOW (ref >90)
GFR, EST NON AFRICAN AMERICAN: 54 mL/min — AB (ref >90)
Glucose, Bld: 103 mg/dL — ABNORMAL HIGH (ref 70–99)
Potassium: 4.7 mmol/L (ref 3.5–5.1)
Sodium: 137 mmol/L (ref 135–145)

## 2015-03-27 LAB — TROPONIN I: TROPONIN I: 0.11 ng/mL — AB (ref <0.031)

## 2015-03-27 LAB — BRAIN NATRIURETIC PEPTIDE: B NATRIURETIC PEPTIDE 5: 2756.8 pg/mL — AB (ref 0.0–100.0)

## 2015-03-27 MED ORDER — FUROSEMIDE 10 MG/ML IJ SOLN
40.0000 mg | Freq: Once | INTRAMUSCULAR | Status: AC
  Administered 2015-03-27: 40 mg via INTRAVENOUS
  Filled 2015-03-27: qty 4

## 2015-03-27 NOTE — ED Notes (Signed)
Charge Nurse informed @2346 /appt time@0006 .

## 2015-03-27 NOTE — ED Notes (Signed)
The pt is c/o sob with sinus congestion and he reports that he has lower extremity swelling for 2-3 weeks.  He was [facility] at [facility] fior his sinus congestion

## 2015-03-27 NOTE — ED Provider Notes (Addendum)
CSN: [id number]     Arrival date & time 03/27/15  1705 History   First MD Initiated Contact with Patient 03/27/15 1851     Chief Complaint  Patient presents with  . Shortness of Breath     (Consider location/radiation/quality/duration/timing/severity/associated sxs/prior Treatment) Patient is a 54 y.o. male presenting with shortness of breath. The history is provided by the patient.  Shortness of Breath Severity:  Mild Onset quality:  Gradual Duration:  2 weeks Timing:  Constant Progression:  Worsening Chronicity:  Recurrent Context: URI   Relieved by:  Nothing Worsened by:  Nothing tried Ineffective treatments:  None tried Associated symptoms: cough   Associated symptoms: no abdominal pain, no chest pain, no fever, no headaches, no neck pain and no vomiting   Cough:    Cough characteristics:  Productive   Sputum characteristics:  Yellow and white   Severity:  Mild   Onset quality:  Gradual   Duration:  2 weeks   Timing:  Constant   Progression:  Improving   Chronicity:  New   Past Medical History  Diagnosis Date  . Insomnia   . CHF (congestive heart failure)   . Congenital insufficiency of aortic valve   . Congestive cardiomyopathy   . Atrial flutter   . Benign hypertensive heart and renal disease   . Essential hypertension, malignant   . SOB (shortness of breath)   . Murmur   . Aortic stenosis 11/19/2014  . 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 Benign essential HTN 11/19/2014  . Chronic systolic CHF (congestive heart failure) 11/19/2014  . Atrial flutter 11/19/2014   Past Surgical History  Procedure Laterality Date  . Left and right heart catheterization with coronary angiogram N/A 11/27/2014    Procedure: LEFT AND RIGHT HEART CATHETERIZATION WITH CORONARY ANGIOGRAM;  Surgeon: [name], MD;  Location: [facility] CATH LAB;  Service: Cardiovascular;  Laterality: N/A;   Family History  Problem Relation Age of Onset  . Heart attack Neg Hx    History   Substance Use Topics  . Smoking status: Never Smoker   . Smokeless tobacco: Not on file  . Alcohol Use: Not on file    Review of Systems  Constitutional: Negative for fever.  HENT: Negative for drooling and rhinorrhea.   Eyes: Negative for pain.  Respiratory: Positive for cough and shortness of breath.   Cardiovascular: Positive for leg swelling. Negative for chest pain.  Gastrointestinal: Negative for nausea, vomiting, abdominal pain and diarrhea.  Genitourinary: Negative for dysuria and hematuria.  Musculoskeletal: Negative for gait problem and neck pain.  Skin: Negative for color change.  Neurological: Negative for numbness and headaches.  Hematological: Negative for adenopathy.  Psychiatric/Behavioral: Negative for behavioral problems.  All other systems reviewed and are negative.     Allergies  Review of patient's allergies indicates no known allergies.  Home Medications   Prior to Admission medications   Medication Sig Start Date End Date Taking? Authorizing Provider  albuterol (PROVENTIL HFA;VENTOLIN HFA) 108 (90 BASE) MCG/ACT inhaler Inhale 2 puffs into the lungs every 4 (four) hours as needed for wheezing or shortness of breath.    Historical Provider, MD  aspirin 81 MG tablet Take 81 mg by mouth daily.    Historical Provider, MD  atorvastatin (LIPITOR) 40 MG tablet Take 40 mg by mouth daily.    Historical Provider, MD  carvedilol (COREG) 25 MG tablet Take 1 tablet (25 mg total) by mouth 2 (two) times daily with a meal. 11/19/14   Traci  Remonia Richter, MD  clopidogrel (PLAVIX) 75 MG tablet Take 75 mg by mouth daily.    Historical Provider, MD  furosemide (LASIX) 40 MG tablet Take 40 mg by mouth 2 (two) times daily.     Historical Provider, MD  hydrALAZINE (APRESOLINE) 25 MG tablet Take 37.5 mg by mouth 3 (three) times daily.    Historical Provider, MD  potassium chloride SA (KLOR-CON M20) 20 MEQ tablet Take 1 tablet (20 mEq total) by mouth daily. 11/20/14   Sueanne Margarita, MD   ranitidine (ZANTAC) 150 MG capsule Take 150 mg by mouth daily.    Historical Provider, MD  vitamin B-12 (CYANOCOBALAMIN) 500 MCG tablet Take 500 mcg by mouth daily.    Historical Provider, MD   BP 157/88 mmHg  Pulse 67  Temp(Src) 98.7 F (37.1 C) (Oral)  Resp 17  Wt 242 lb 1 oz (109.799 kg)  SpO2 95% Physical Exam  Constitutional: He is oriented to person, place, and time. He appears well-developed and well-nourished.  HENT:  Head: Normocephalic and atraumatic.  Right Ear: External ear normal.  Left Ear: External ear normal.  Nose: Nose normal.  Mouth/Throat: Oropharynx is clear and moist. No oropharyngeal exudate.  Eyes: Conjunctivae and EOM are normal. Pupils are equal, round, and reactive to light.  Neck: Normal range of motion. Neck supple.  Cardiovascular: Normal rate, regular rhythm, normal heart sounds and intact distal pulses.  Exam reveals no gallop and no friction rub.   No murmur heard. Pulmonary/Chest: Effort normal and breath sounds normal. No respiratory distress. He has no wheezes.  Abdominal: Soft. Bowel sounds are normal. He exhibits no distension. There is no tenderness. There is no rebound and no guarding.  Musculoskeletal: Normal range of motion. He exhibits edema (mild to moderate pitting edema in bilateral distal lower extremity.). He exhibits no tenderness.  Neurological: He is alert and oriented to person, place, and time.  Skin: Skin is warm and dry.  Psychiatric: He has a normal mood and affect. His behavior is normal.  Nursing note and vitals reviewed.   ED Course  Procedures (including critical care time) Labs Review Labs Reviewed  BASIC METABOLIC PANEL - Abnormal; Notable for the following:    Glucose, Bld 103 (*)    Creatinine, Ser 1.44 (*)    GFR calc non Af Amer 54 (*)    GFR calc Af Amer 62 (*)    All other components within normal limits  BRAIN NATRIURETIC PEPTIDE - Abnormal; Notable for the following:    B Natriuretic Peptide 2756.8 (*)     All other components within normal limits  TROPONIN I - Abnormal; Notable for the following:    Troponin I 0.11 (*)    All other components within normal limits  CBC WITH DIFFERENTIAL/PLATELET    Imaging Review Dg Chest 2 View  03/27/2015   CLINICAL DATA:  Shortness of breath and sinus congestion  EXAM: CHEST  2 VIEW  COMPARISON:  None.  FINDINGS: There is moderate to marked cardiac enlargement. No pleural effusion or edema identified. Scar noted within the left midlung. Both lungs are clear. The visualized skeletal structures are unremarkable.  IMPRESSION: 1. Cardiac enlargement.   Electronically Signed   By: Kerby Moors M.D.   On: 03/27/2015 17:58     EKG Interpretation   Date/Time:  Saturday March 27 2015 17:18:13 EDT Ventricular Rate:  74 PR Interval:    QRS Duration: 132 QT Interval:  452 QTC Calculation: 501 R Axis:   -77  Text Interpretation:  Atrial fibrillation Left axis deviation Non-specific  intra-ventricular conduction block T wave abnormality, consider lateral  ischemia Abnormal ECG Confirmed by Emylie Amster  MD, Krysti Hickling ([id number]) on  03/27/2015 7:20:33 PM     CRITICAL CARE Performed by: Pamella Pert, S Total critical care time: 30 min Critical care time was exclusive of separately billable procedures and treating other patients. Critical care was necessary to treat or prevent imminent or life-threatening deterioration. Critical care was time spent personally by me on the following activities: development of treatment plan with patient and/or surrogate as well as nursing, discussions with consultants, evaluation of patient's response to treatment, examination of patient, obtaining history from patient or surrogate, ordering and performing treatments and interventions, ordering and review of laboratory studies, ordering and review of radiographic studies, pulse oximetry and re-evaluation of patient's condition.  MDM   Final diagnoses:  CHF exacerbation  Elevated  troponin    7:44 PM 54 y.o. male w hx of chf, aortic valve insuff, atrial flutter on plavix, who presents with worsening shortness of breath over the last 2 weeks. He states his symptoms initially started with a URI which has mildly improved. He states that he did take a course of antibiotics. He denies any fevers. He notes worsening swelling in his lower extremities despite taking diuretics as prescribed. He is afebrile and vital signs are unremarkable here. We'll get screening labs and imaging. Suspect mild CHF exacerbation. He denies any cp.   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 Normal coronary arteries.  Elevated troponin, likely demand ischemia. Will admit to cards.   Pamella Pert, MD 03/28/15 [place], MD 03/28/15 1250

## 2015-03-27 NOTE — ED Notes (Addendum)
Pt ambulating in the hall, ambulated independently about 10 feet and desaturated to 78% on RA. C/o fatigue and SHOB. Escorted back to bed and placed on 2L [place].

## 2015-03-27 NOTE — ED Notes (Signed)
MD at bedside. 

## 2015-03-27 NOTE — H&P (Addendum)
[name] is an 54 y.o. male.    Chief Complaint: shortness of breath Primary Cardiologist: Dr. Radford Pax HPI: [name] is a 54 yo man with PMH of hypertension, atrial flutter, mild AS, moderate AI and nonischemic cardiomyopathy with MUGA EF ~ 35% after LHC 15-20% after being hospitalized several times for CHF having 12/11-12/12/15 admission for RHC/LHC. He presents today with 2 weeks of progressive shortness of breath. No infectious symptoms - no fever/chills/nausea/vomiting/diarrhea. He has 20 feet dyspnea on exertion. He tells me he sleeps on 8 pillows/recliner. He wakes up SOB from sleep. He received IV lasix in the ER. ECG noted to be in atrial flutter with BNP 2700 and Trop 0.1. No current chest pain.   Past Medical History  Diagnosis Date  . Insomnia   . CHF (congestive heart failure)   . Congenital insufficiency of aortic valve   . Congestive cardiomyopathy   . Atrial flutter   . Benign hypertensive heart and renal disease   . Essential hypertension, malignant   . SOB (shortness of breath)   . Murmur   . Aortic stenosis 11/19/2014  . 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 Benign essential HTN 11/19/2014  . Chronic systolic CHF (congestive heart failure) 11/19/2014  . Atrial flutter 11/19/2014    Past Surgical History  Procedure Laterality Date  . Left and right heart catheterization with coronary angiogram N/A 11/27/2014    Procedure: LEFT AND RIGHT HEART CATHETERIZATION WITH CORONARY ANGIOGRAM;  Surgeon: [name], MD;  Location: [facility] CATH LAB;  Service: Cardiovascular;  Laterality: N/A;    Family History  Problem Relation Age of Onset  . Heart attack Neg Hx    Social History:  reports that he has never smoked. He does not have any smokeless tobacco history on file. His alcohol and drug histories are not on file.  Allergies: No Known Allergies   (Not in a hospital admission)  Results for orders placed or performed during the hospital encounter  of 03/27/15 (from the past 48 hour(s))  Basic metabolic panel     Status: Abnormal   Collection Time: 03/27/15  5:20 PM  Result Value Ref Range   Sodium 137 135 - 145 mmol/L   Potassium 4.7 3.5 - 5.1 mmol/L   Chloride 105 96 - 112 mmol/L   CO2 19 19 - 32 mmol/L   Glucose, Bld 103 (H) 70 - 99 mg/dL   BUN 20 6 - 23 mg/dL   Creatinine, Ser 1.44 (H) 0.50 - 1.35 mg/dL   Calcium 9.2 8.4 - 10.5 mg/dL   GFR calc non Af Amer 54 (L) >90 mL/min   GFR calc Af Amer 62 (L) >90 mL/min    Comment: (NOTE) The eGFR has been calculated using the CKD EPI equation. This calculation has not been validated in all clinical situations. eGFR's persistently <90 mL/min signify possible Chronic Kidney Disease.    Anion gap 13 5 - 15  BNP (order ONLY if patient complains of dyspnea/SOB AND you have documented it for THIS visit)     Status: Abnormal   Collection Time: 03/27/15  5:20 PM  Result Value Ref Range   B Natriuretic Peptide 2756.8 (H) 0.0 - 100.0 pg/mL  CBC with Differential     Status: None   Collection Time: 03/27/15  5:20 PM  Result Value Ref Range   WBC 7.8 4.0 - 10.5 K/uL   RBC 5.36 4.22 - 5.81 MIL/uL   Hemoglobin 14.5 13.0 - 17.0 g/dL   HCT  43.8 39.0 - 52.0 %   MCV 81.7 78.0 - 100.0 fL   MCH 27.1 26.0 - 34.0 pg   MCHC 33.1 30.0 - 36.0 g/dL   RDW 15.3 11.5 - 15.5 %   Platelets 240 150 - 400 K/uL   Neutrophils Relative % 71 43 - 77 %   Neutro Abs 5.6 1.7 - 7.7 K/uL   Lymphocytes Relative 24 12 - 46 %   Lymphs Abs 1.8 0.7 - 4.0 K/uL   Monocytes Relative 4 3 - 12 %   Monocytes Absolute 0.3 0.1 - 1.0 K/uL   Eosinophils Relative 0 0 - 5 %   Eosinophils Absolute 0.0 0.0 - 0.7 K/uL   Basophils Relative 1 0 - 1 %   Basophils Absolute 0.0 0.0 - 0.1 K/uL  Troponin I     Status: Abnormal   Collection Time: 03/27/15  5:20 PM  Result Value Ref Range   Troponin I 0.11 (H) <0.031 ng/mL    Comment:        PERSISTENTLY INCREASED TROPONIN VALUES IN THE RANGE OF 0.04-0.49 ng/mL CAN BE SEEN IN:        -UNSTABLE ANGINA       -CONGESTIVE HEART FAILURE       -MYOCARDITIS       -CHEST TRAUMA       -ARRYHTHMIAS       -LATE PRESENTING MYOCARDIAL INFARCTION       -COPD   CLINICAL FOLLOW-UP RECOMMENDED.    Dg Chest 2 View  03/27/2015   CLINICAL DATA:  Shortness of breath and sinus congestion  EXAM: CHEST  2 VIEW  COMPARISON:  None.  FINDINGS: There is moderate to marked cardiac enlargement. No pleural effusion or edema identified. Scar noted within the left midlung. Both lungs are clear. The visualized skeletal structures are unremarkable.  IMPRESSION: 1. Cardiac enlargement.   Electronically Signed   By: Kerby Moors M.D.   On: 03/27/2015 17:58    Review of Systems  Constitutional: Positive for malaise/fatigue. Negative for fever and chills.  HENT: Negative for ear discharge.   Eyes: Negative for double vision and photophobia.  Respiratory: Positive for cough and shortness of breath.   Cardiovascular: Positive for orthopnea, leg swelling and PND.  Gastrointestinal: Positive for heartburn and nausea. Negative for abdominal pain and diarrhea.  Genitourinary: Negative for dysuria and hematuria.  Musculoskeletal: Negative for myalgias and neck pain.  Skin: Negative for rash.  Neurological: Negative for dizziness, tingling, seizures, loss of consciousness and headaches.  Endo/Heme/Allergies: Negative for polydipsia. Does not bruise/bleed easily.  Psychiatric/Behavioral: Negative for depression, suicidal ideas and hallucinations.    Blood pressure 119/44, pulse 72, temperature 98.7 F (37.1 C), temperature source Oral, resp. rate 18, weight 109.799 kg (242 lb 1 oz), SpO2 95 %. Physical Exam  Nursing note and vitals reviewed. Constitutional: He is oriented to person, place, and time. He appears well-developed and well-nourished. He appears distressed.  Overweight man, pleasant, mildly uncomfortable  HENT:  Head: Normocephalic and atraumatic.  Nose: Nose normal.  Mouth/Throat: Oropharynx  is clear and moist. No oropharyngeal exudate.  Eyes: Conjunctivae and EOM are normal. Pupils are equal, round, and reactive to light. No scleral icterus.  Neck: Normal range of motion. Neck supple. JVD present.  JVP to mandible  Cardiovascular: Intact distal pulses.  Exam reveals no gallop.   Murmur heard. Bradycardic, irregular irregular, systolic murmur at RUSB/LUSB  Respiratory: Effort normal. No respiratory distress. He has no wheezes. He has rales.  GI: Soft.  Bowel sounds are normal. He exhibits distension.  Musculoskeletal: Normal range of motion. He exhibits edema. He exhibits no tenderness.  Neurological: He is alert and oriented to person, place, and time. No cranial nerve deficit.  Skin: No rash noted. He is not diaphoretic. No erythema.  Lukewarm extremities  Psychiatric: He has a normal mood and affect. His behavior is normal. Thought content normal.   RHC/LHC 12/15: RAP 7, RV 50/8, PA 50/15, PCWP 16; CO/CI 5.4/2.4, PA saturation 65% LHC clean coronary arteries, EF 15-20% Labs reviewed; na 137, K 4.7, bun/cr 20/1.44, h/h 14.5/43.8, plt 240, wbc 7.8, BNP 2700, Trop 0.1 Chest x-ray: no overt pulmonary edema but very enlarged heart LA/LV 12/15 Echo with EF 35-40%, mild AS, moderate AI, severely dilated LV, diffuse hypokinesis, dilated LA ECG reviewed; atrial flutter, LVH, consider inferior infarct/LAD  Assessment/Plan Problem List Acute on chronic systolic heart failure Atrial flutter CKD Stage II Mild AS/Moderate AI  Hypoxia  [name] is a 54 yo man with PMH of hypertension, atrial flutter, mild AS, moderate AI and nonischemic cardiomyopathy with MUGA EF ~ 35% after LHC 15-20% after being hospitalized several times for CHF having 12/11-12/12/15 admission for RHC/LHC who presents with 2 weeks of progressive shortness of breath. He has progressive heart failure with presumed weight gain, elevated BNP, significant symptoms. Differential includes valvular disease  progression among many other possibilities. He is more hypoxic than I would presume from heart failure. Watch closely. May need to consider evaluation for PE as trigger with V/Q scan given creatinine. He will receive heparin for atrial flutter that would treat a potential PE. Received IV lasix in the ER. Will start anticoagulation with heparin here, diuresis, evaluate for triggers. He is lukewarm on exam but maintaining a blood pressure. Repeat echocardiogram in AM. CHADS2VASC 2 (hypertension/heart failure).  - daily aspirin, heparin gtt for atrial flutter - update echocardiogram in AM  - urinalysis, TSH, BNP, hba1c - V/Q scan  Mina Babula 03/27/2015, 11:02 PM

## 2015-03-28 ENCOUNTER — Inpatient Hospital Stay (HOSPITAL_COMMUNITY): Payer: Medicaid Other

## 2015-03-28 ENCOUNTER — Encounter (HOSPITAL_COMMUNITY): Payer: Self-pay | Admitting: *Deleted

## 2015-03-28 DIAGNOSIS — R7989 Other specified abnormal findings of blood chemistry: Secondary | ICD-10-CM

## 2015-03-28 DIAGNOSIS — R778 Other specified abnormalities of plasma proteins: Secondary | ICD-10-CM

## 2015-03-28 LAB — HEPARIN LEVEL (UNFRACTIONATED)
HEPARIN UNFRACTIONATED: 0.31 [IU]/mL (ref 0.30–0.70)
Heparin Unfractionated: 0.45 IU/mL (ref 0.30–0.70)

## 2015-03-28 LAB — BASIC METABOLIC PANEL WITH GFR
Anion gap: 13 (ref 5–15)
BUN: 22 mg/dL (ref 6–23)
CO2: 20 mmol/L (ref 19–32)
Calcium: 8.6 mg/dL (ref 8.4–10.5)
Chloride: 106 mmol/L (ref 96–112)
Creatinine, Ser: 1.48 mg/dL — ABNORMAL HIGH (ref 0.50–1.35)
GFR calc Af Amer: 60 mL/min — ABNORMAL LOW
GFR calc non Af Amer: 52 mL/min — ABNORMAL LOW
Glucose, Bld: 113 mg/dL — ABNORMAL HIGH (ref 70–99)
Potassium: 4 mmol/L (ref 3.5–5.1)
Sodium: 139 mmol/L (ref 135–145)

## 2015-03-28 LAB — MAGNESIUM: Magnesium: 1.9 mg/dL (ref 1.5–2.5)

## 2015-03-28 LAB — TSH: TSH: 4.729 u[IU]/mL — ABNORMAL HIGH (ref 0.350–4.500)

## 2015-03-28 LAB — TROPONIN I
TROPONIN I: 0.11 ng/mL — AB (ref <0.031)
Troponin I: 0.1 ng/mL — ABNORMAL HIGH (ref <0.031)
Troponin I: 0.11 ng/mL — ABNORMAL HIGH (ref <0.031)

## 2015-03-28 LAB — CBC
HEMATOCRIT: 41.3 % (ref 39.0–52.0)
Hemoglobin: 13.7 g/dL (ref 13.0–17.0)
MCH: 27.2 pg (ref 26.0–34.0)
MCHC: 33.2 g/dL (ref 30.0–36.0)
MCV: 82.1 fL (ref 78.0–100.0)
PLATELETS: 224 10*3/uL (ref 150–400)
RBC: 5.03 MIL/uL (ref 4.22–5.81)
RDW: 15.3 % (ref 11.5–15.5)
WBC: 7.1 10*3/uL (ref 4.0–10.5)

## 2015-03-28 LAB — BRAIN NATRIURETIC PEPTIDE: B Natriuretic Peptide: 1849.9 pg/mL — ABNORMAL HIGH (ref 0.0–100.0)

## 2015-03-28 LAB — MRSA PCR SCREENING: MRSA by PCR: NEGATIVE

## 2015-03-28 MED ORDER — HEPARIN BOLUS VIA INFUSION
4000.0000 [IU] | Freq: Once | INTRAVENOUS | Status: AC
  Administered 2015-03-28: 4000 [IU] via INTRAVENOUS
  Filled 2015-03-28: qty 4000

## 2015-03-28 MED ORDER — SODIUM CHLORIDE 0.9 % IV SOLN
250.0000 mL | INTRAVENOUS | Status: DC | PRN
  Administered 2015-03-31: 250 mL via INTRAVENOUS

## 2015-03-28 MED ORDER — FUROSEMIDE 10 MG/ML IJ SOLN
60.0000 mg | Freq: Two times a day (BID) | INTRAMUSCULAR | Status: DC
  Administered 2015-03-28 – 2015-03-30 (×7): 60 mg via INTRAVENOUS
  Filled 2015-03-28 (×8): qty 6

## 2015-03-28 MED ORDER — ALBUTEROL SULFATE (2.5 MG/3ML) 0.083% IN NEBU: 3.0000 mL | INHALATION_SOLUTION | RESPIRATORY_TRACT | Status: DC | PRN

## 2015-03-28 MED ORDER — HEPARIN (PORCINE) IN NACL 100-0.45 UNIT/ML-% IJ SOLN
1600.0000 [IU]/h | INTRAMUSCULAR | Status: DC
  Administered 2015-03-28: 1500 [IU]/h via INTRAVENOUS
  Administered 2015-03-28 – 2015-04-05 (×8): 1600 [IU]/h via INTRAVENOUS
  Filled 2015-03-28 (×12): qty 250

## 2015-03-28 MED ORDER — SODIUM CHLORIDE 0.9 % IJ SOLN
3.0000 mL | INTRAMUSCULAR | Status: DC | PRN
  Administered 2015-04-04: 3 mL via INTRAVENOUS
  Filled 2015-03-28: qty 3

## 2015-03-28 MED ORDER — TECHNETIUM TO 99M ALBUMIN AGGREGATED
6.0000 | Freq: Once | INTRAVENOUS | Status: AC | PRN
  Administered 2015-03-28: 6 via INTRAVENOUS

## 2015-03-28 MED ORDER — SODIUM CHLORIDE 0.9 % IJ SOLN
3.0000 mL | Freq: Two times a day (BID) | INTRAMUSCULAR | Status: DC
  Administered 2015-03-28 – 2015-04-03 (×3): 3 mL via INTRAVENOUS

## 2015-03-28 MED ORDER — ONDANSETRON HCL 4 MG/2ML IJ SOLN: 4.0000 mg | Freq: Four times a day (QID) | INTRAMUSCULAR | Status: DC | PRN

## 2015-03-28 MED ORDER — TECHNETIUM TC 99M DIETHYLENETRIAME-PENTAACETIC ACID: 40.0000 | Freq: Once | INTRAVENOUS | Status: AC | PRN

## 2015-03-28 MED ORDER — CLOPIDOGREL BISULFATE 75 MG PO TABS
75.0000 mg | ORAL_TABLET | Freq: Every day | ORAL | Status: DC
  Administered 2015-03-28 – 2015-03-29 (×2): 75 mg via ORAL
  Filled 2015-03-28 (×2): qty 1

## 2015-03-28 MED ORDER — ACETAMINOPHEN 325 MG PO TABS
650.0000 mg | ORAL_TABLET | ORAL | Status: DC | PRN
  Administered 2015-04-02: 650 mg via ORAL
  Filled 2015-03-28: qty 2

## 2015-03-28 MED ORDER — CHLORHEXIDINE GLUCONATE CLOTH 2 % EX PADS
6.0000 | MEDICATED_PAD | Freq: Once | CUTANEOUS | Status: AC
  Administered 2015-03-28: 6 via TOPICAL

## 2015-03-28 MED ORDER — LISINOPRIL 5 MG PO TABS
5.0000 mg | ORAL_TABLET | Freq: Every day | ORAL | Status: DC
  Administered 2015-03-28 – 2015-04-04 (×8): 5 mg via ORAL
  Filled 2015-03-28 (×8): qty 1

## 2015-03-28 MED ORDER — OFF THE BEAT BOOK
Freq: Once | Status: AC
Start: 2015-03-28 — End: 2015-03-28
  Administered 2015-03-28: 08:00:00
  Filled 2015-03-28: qty 1

## 2015-03-28 MED ORDER — ATORVASTATIN CALCIUM 40 MG PO TABS
40.0000 mg | ORAL_TABLET | Freq: Every day | ORAL | Status: DC
Start: 2015-03-28 — End: 2015-04-23
  Administered 2015-03-28 – 2015-04-23 (×23): 40 mg via ORAL
  Filled 2015-03-28 (×26): qty 1

## 2015-03-28 MED ORDER — ASPIRIN EC 81 MG PO TBEC
81.0000 mg | DELAYED_RELEASE_TABLET | Freq: Every day | ORAL | Status: DC
  Administered 2015-03-28 – 2015-03-29 (×2): 81 mg via ORAL
  Filled 2015-03-28 (×3): qty 1

## 2015-03-28 NOTE — Progress Notes (Signed)
ANTICOAGULATION CONSULT NOTE - Follow Up Consult  Pharmacy Consult for Heparin Indication: Aflutter and concern for PE  No Known Allergies  Patient Measurements: Height: 5\' 11"  (180.3 cm) Weight: 238 lb (107.956 kg) IBW/kg (Calculated) : 75.3 Heparin Dosing Weight: 98 kg  Vital Signs: Temp: 98.2 F (36.8 C) (04/10 1607) Temp Source: Oral (04/10 1607) BP: 129/50 mmHg (04/10 1613) Pulse Rate: 62 (04/10 1607)  Labs:  Recent Labs  03/27/15 1720 03/27/15 1920 03/28/15 0702 03/28/15 1306 03/28/15 1903  HGB 14.5  --  13.7  --   --   HCT 43.8  --  41.3  --   --   PLT 240  --  224  --   --   HEPARINUNFRC  --   --  0.31  --  0.45  CREATININE 1.44*  --  1.48*  --   --   TROPONINI 0.11* 0.10* 0.11* 0.11*  --     Estimated Creatinine Clearance: 71.3 mL/min (by C-G formula based on Cr of 1.48).   Medications:  Heparin @ 1600 units/hr (16 ml/hr)  Assessment: 54 YOM who continues on heparin for Aflutter. Originally also concerned for PE but VQ scan earlier today showed a low probability. Heparin level this evening remains therapeutic after a slight rate increase earlier this afternoon (HL 0.45 << 0.31, goal of 0.3-0.7). CBC stable from this AM - no overt s/sx of bleeding noted.   Goal of Therapy:  Heparin level 0.3-0.7 units/ml Monitor platelets by anticoagulation protocol: Yes   Plan:  1. Continue heparin at the current rate of 1600 units/hr (16 ml/hr) 2. Will continue to monitor for any signs/symptoms of bleeding and will follow up with heparin level in the a.m.   Alycia Rossetti, PharmD, BCPS Clinical Pharmacist Pager: [phone] 03/28/2015 7:49 PM

## 2015-03-28 NOTE — Progress Notes (Deleted)
Primary cardiologist:  Dr. [name]   Subjective:    54 y.o. male with hx of chronic AFlutter, mod AI, NICM with EF 79%, systolic HF, CKD, HTN.  Admitted for progressive SOB and a/c systolic HF.  VQ pending.  Echo pending.  Good diuresis since admit (-1954).    This AM patient feeling somewhat better with less SOB and was able to lie more flat last night.  No chest pain.   Objective:   Temp:  [97.7 F (36.5 C)-98.7 F (37.1 C)] 97.7 F (36.5 C) (04/10 0500) Pulse Rate:  [47-88] 54 (04/10 0500) Resp:  [17-24] 18 (04/10 0500) BP: (107-157)/(44-88) 128/49 mmHg (04/10 0500) SpO2:  [91 %-100 %] 98 % (04/10 0500) Weight:  [238 lb (107.956 kg)-242 lb 1 oz (109.799 kg)] 238 lb (107.956 kg) (04/10 0020) Last BM Date: 03/27/15  Filed Weights   03/27/15 1725 03/28/15 0020  Weight: 242 lb 1 oz (109.799 kg) 238 lb (107.956 kg)    Intake/Output Summary (Last 24 hours) at 03/28/15 0802 Last data filed at 03/28/15 0559  Gross per 24 hour  Intake 270.25 ml  Output   2225 ml  Net -1954.75 ml    Telemetry:  AFlutter, HR 40s at times.  Exam: General:  NAD HEENT:  + JVD Lungs:  Decreased BS, no rales Cardiac:  RRR, 2/6 diast murmur LLSB, 1+ bilat ankle edema. Abdomen:  Soft, NT MSK:  No deformity Neuro:  No deficit Psych:  Normal affect    Lab Results:  Basic Metabolic Panel:  Recent Labs Lab 03/27/15 1720 03/27/15 1920  NA 137  --   K 4.7  --   CL 105  --   CO2 19  --   GLUCOSE 103*  --   BUN 20  --   CREATININE 1.44*  --   CALCIUM 9.2  --   MG  --  1.9    Liver Function Tests: No results for input(s): AST, ALT, ALKPHOS, BILITOT, PROT, ALBUMIN in the last 168 hours.  CBC:  Recent Labs Lab 03/27/15 1720  WBC 7.8  HGB 14.5  HCT 43.8  MCV 81.7  PLT 240    Cardiac Enzymes:  Recent Labs Lab 03/27/15 1720 03/27/15 1920  TROPONINI 0.11* 0.10*    BNP:  Recent Labs  11/19/14 1033 11/26/14 0953  PROBNP 837.0* 776.0*    Coagulation: No  results for input(s): INR in the last 168 hours.   Lab Results  Component Value Date   TSH 4.729* 03/28/2015      Medications:   Scheduled Medications: . aspirin EC  81 mg Oral Daily  . atorvastatin  40 mg Oral Daily  . clopidogrel  75 mg Oral Daily  . furosemide  60 mg Intravenous BID  . off the beat book   Does not apply Once  . sodium chloride  3 mL Intravenous Q12H     Infusions: . heparin 1,500 Units/hr (03/28/15 0111)     PRN Medications:  sodium chloride, acetaminophen, albuterol, ondansetron (ZOFRAN) IV, sodium chloride   Assessment:   Principal Problem:   Acute on chronic systolic heart failure Active Problems:   Aortic insufficiency   Congestive dilated cardiomyopathy   Benign essential HTN   CKD (chronic kidney disease)   Atrial flutter   Elevated troponin     Plan/Discussion:    Patient somewhat improved since yesterday.  Good diuresis last night.  Continue current Lasix dose.  PTA medications included hydralazine, coreg 25 bid, lisinopril 10  bid.  HR somewhat slow. BP controlled right now.  Will hold off on adding beta-blocker back right now.  Resume Lisinopril at 5 mg QD for now.    BMET in AM  Elevated Tn likely related to demand ischemia.  LHC with normal cors in 11/2014.  TSH mildly elevated.  Repeat TSH with FT4, FT3.  Echo pending.  VQ pending.   Richardson Dopp, PA-C   03/28/2015 8:02 AM Pager [phone]

## 2015-03-28 NOTE — Progress Notes (Signed)
Utilization review completed.  

## 2015-03-28 NOTE — Progress Notes (Addendum)
Patient ID: [name], male   DOB: August 12, 1961, 54 y.o.   MRN: [id number]     Primary cardiologist:  Dr. [name]   Subjective:    54 y.o. male with hx of chronic AFlutter, mod AI, NICM with EF 28%, systolic HF, CKD, HTN.  Admitted for progressive SOB and a/c systolic HF.  VQ pending.  Echo pending.  Good diuresis since admit (-1954).    This AM patient feeling somewhat better with less SOB and was able to lie more flat last night.  No chest pain.   Objective:   Temp:  [97.7 F (36.5 C)-98.7 F (37.1 C)] 98.1 F (36.7 C) (04/10 0730) Pulse Rate:  [47-88] 55 (04/10 0730) Resp:  [17-24] 19 (04/10 0730) BP: (107-157)/(43-88) 123/43 mmHg (04/10 0730) SpO2:  [91 %-100 %] 97 % (04/10 0730) Weight:  [238 lb (107.956 kg)-242 lb 1 oz (109.799 kg)] 238 lb (107.956 kg) (04/10 0020) Last BM Date: 03/27/15  Filed Weights   03/27/15 1725 03/28/15 0020  Weight: 242 lb 1 oz (109.799 kg) 238 lb (107.956 kg)    Intake/Output Summary (Last 24 hours) at 03/28/15 0959 Last data filed at 03/28/15 0851  Gross per 24 hour  Intake 510.25 ml  Output   2600 ml  Net -2089.75 ml    Telemetry:  AFlutter, HR 40s at times.  Exam: General:  NAD HEENT:  + JVD Lungs:  Decreased BS, no rales Cardiac:  RRR, 2/6 diast murmur LLSB, 1+ bilat ankle edema. Abdomen:  Soft, NT MSK:  No deformity Neuro:  No deficit Psych:  Normal affect    Lab Results:  Basic Metabolic Panel:  Recent Labs Lab 03/27/15 1720 03/27/15 1920 03/28/15 0702  NA 137  --  139  K 4.7  --  4.0  CL 105  --  106  CO2 19  --  20  GLUCOSE 103*  --  113*  BUN 20  --  22  CREATININE 1.44*  --  1.48*  CALCIUM 9.2  --  8.6  MG  --  1.9  --     Liver Function Tests: No results for input(s): AST, ALT, ALKPHOS, BILITOT, PROT, ALBUMIN in the last 168 hours.  CBC:  Recent Labs Lab 03/27/15 1720 03/28/15 0702  WBC 7.8 7.1  HGB 14.5 13.7  HCT 43.8 41.3  MCV 81.7 82.1  PLT 240 224    Cardiac Enzymes:  Recent  Labs Lab 03/27/15 1720 03/27/15 1920 03/28/15 0702  TROPONINI 0.11* 0.10* 0.11*    BNP:  Recent Labs  11/19/14 1033 11/26/14 0953  PROBNP 837.0* 776.0*    Coagulation: No results for input(s): INR in the last 168 hours.   Lab Results  Component Value Date   TSH 4.729* 03/28/2015      Medications:   Scheduled Medications: . aspirin EC  81 mg Oral Daily  . atorvastatin  40 mg Oral Daily  . clopidogrel  75 mg Oral Daily  . furosemide  60 mg Intravenous BID  . lisinopril  5 mg Oral Daily  . off the beat book   Does not apply Once  . sodium chloride  3 mL Intravenous Q12H    Infusions: . heparin 1,500 Units/hr (03/28/15 0111)    PRN Medications: sodium chloride, acetaminophen, albuterol, ondansetron (ZOFRAN) IV, sodium chloride   Assessment:   Principal Problem:   Acute on chronic systolic heart failure Active Problems:   Aortic insufficiency   Congestive dilated cardiomyopathy   Benign essential HTN  CKD (chronic kidney disease)   Atrial flutter   Elevated troponin     Plan/Discussion:    Patient somewhat improved since yesterday.  Good diuresis last night.  Continue current Lasix dose.  PTA medications included hydralazine, coreg 25 bid, lisinopril 10 bid.  HR somewhat slow. BP controlled right now.  Will hold off on adding beta-blocker back right now.  Resume Lisinopril at 5 mg QD for now.    BMET in AM  Elevated Tn likely related to demand ischemia.  LHC with normal cors in 11/2014.  TSH mildly elevated.  Repeat TSH with FT4, FT3.  Echo pending.  VQ pending.   Jenkins Rouge   Patient examined with PA and chart reviewed  AR murmur  Having V/Q now.  Diuresing with laix  Rate control ok Doubt ischemia with normal cors last year cath Further recommendations based on results of echo and V/Q    [employer]

## 2015-03-28 NOTE — ED Notes (Signed)
Escorted to 3W by this RN, bedside report completed with Scientist, forensic

## 2015-03-28 NOTE — Progress Notes (Signed)
ANTICOAGULATION CONSULT NOTE - Initial Consult  Pharmacy Consult for heparin Indication: Aflutter and r/o PE  No Known Allergies  Patient Measurements: Height: 5\' 11"  (180.3 cm) Weight: 238 lb (107.956 kg) IBW/kg (Calculated) : 75.3 Heparin Dosing Weight: 100kg  Vital Signs: Temp: 98.2 F (36.8 C) (04/10 0020) Temp Source: Oral (04/10 0020) BP: 147/61 mmHg (04/10 0020) Pulse Rate: 51 (04/10 0020)  Labs:  Recent Labs  03/27/15 1720  HGB 14.5  HCT 43.8  PLT 240  CREATININE 1.44*  TROPONINI 0.11*    Estimated Creatinine Clearance: 73.3 mL/min (by C-G formula based on Cr of 1.44).   Medical History: Past Medical History  Diagnosis Date  . Insomnia   . CHF (congestive heart failure)   . Congenital insufficiency of aortic valve   . Congestive cardiomyopathy   . Atrial flutter   . Benign hypertensive heart and renal disease   . Essential hypertension, malignant   . SOB (shortness of breath)   . Murmur   . Aortic stenosis 11/19/2014  . 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 Benign essential HTN 11/19/2014  . Chronic systolic CHF (congestive heart failure) 11/19/2014  . Atrial flutter 11/19/2014    Medications:  Prescriptions prior to admission  Medication Sig Dispense Refill Last Dose  . albuterol (PROVENTIL HFA;VENTOLIN HFA) 108 (90 BASE) MCG/ACT inhaler Inhale 2 puffs into the lungs every 4 (four) hours as needed for wheezing or shortness of breath.   03/27/2015 at Unknown time  . aspirin 81 MG tablet Take 81 mg by mouth daily.   03/27/2015 at Unknown time  . atorvastatin (LIPITOR) 40 MG tablet Take 40 mg by mouth daily.   03/27/2015 at Unknown time  . carvedilol (COREG) 25 MG tablet Take 1 tablet (25 mg total) by mouth 2 (two) times daily with a meal. 60 tablet 6 03/27/2015 at 200 pm  . clopidogrel (PLAVIX) 75 MG tablet Take 75 mg by mouth daily.   03/27/2015 at Unknown time  . furosemide (LASIX) 40 MG tablet Take 40 mg by mouth 2 (two) times daily.    03/27/2015 at  Unknown time  . hydrALAZINE (APRESOLINE) 25 MG tablet Take 37.5 mg by mouth 3 (three) times daily.   03/27/2015 at Unknown time  . potassium chloride SA (KLOR-CON M20) 20 MEQ tablet Take 1 tablet (20 mEq total) by mouth daily. 30 tablet 1 03/27/2015 at Unknown time  . ranitidine (ZANTAC) 150 MG capsule Take 150 mg by mouth daily.   03/27/2015 at Unknown time  . vitamin B-12 (CYANOCOBALAMIN) 500 MCG tablet Take 500 mcg by mouth daily.   03/27/2015 at Unknown time   Scheduled:  . aspirin EC  81 mg Oral Daily  . atorvastatin  40 mg Oral Daily  . clopidogrel  75 mg Oral Daily  . furosemide  60 mg Intravenous BID  . sodium chloride  3 mL Intravenous Q12H    Assessment: 54yo male c/o progressive SOB over 2wk, admitted for acute on chronic CHF, also noted to be in Aflutter w/ concern for PE, awaiting VQ scan, to begin heparin; CHADS2 score 2.  Goal of Therapy:  Heparin level 0.3-0.7 units/ml Monitor platelets by anticoagulation protocol: Yes   Plan:  Will give heparin 4000 units IV bolus x1 followed by gtt at 1500 units/hr and monitor heparin levels and CBC and f/u anticoag plans.  Wynona Neat, PharmD, BCPS  03/28/2015,12:30 AM

## 2015-03-28 NOTE — Progress Notes (Signed)
Pt heart rate decreased to 39 nonsustained while sleeping intermittently.  Pt VSS.  Will continue to monitor.

## 2015-03-29 ENCOUNTER — Encounter (HOSPITAL_COMMUNITY): Payer: Self-pay | Admitting: Physician Assistant

## 2015-03-29 DIAGNOSIS — I359 Nonrheumatic aortic valve disorder, unspecified: Secondary | ICD-10-CM

## 2015-03-29 DIAGNOSIS — I350 Nonrheumatic aortic (valve) stenosis: Secondary | ICD-10-CM

## 2015-03-29 DIAGNOSIS — Q231 Congenital insufficiency of aortic valve: Secondary | ICD-10-CM

## 2015-03-29 LAB — BASIC METABOLIC PANEL
ANION GAP: 9 (ref 5–15)
BUN: 20 mg/dL (ref 6–23)
CALCIUM: 8.5 mg/dL (ref 8.4–10.5)
CHLORIDE: 104 mmol/L (ref 96–112)
CO2: 27 mmol/L (ref 19–32)
Creatinine, Ser: 1.52 mg/dL — ABNORMAL HIGH (ref 0.50–1.35)
GFR calc Af Amer: 58 mL/min — ABNORMAL LOW (ref >90)
GFR calc non Af Amer: 50 mL/min — ABNORMAL LOW (ref >90)
Glucose, Bld: 115 mg/dL — ABNORMAL HIGH (ref 70–99)
Potassium: 3.6 mmol/L (ref 3.5–5.1)
SODIUM: 140 mmol/L (ref 135–145)

## 2015-03-29 LAB — CBC
HCT: 40.1 % (ref 39.0–52.0)
HEMOGLOBIN: 13 g/dL (ref 13.0–17.0)
MCH: 26.8 pg (ref 26.0–34.0)
MCHC: 32.4 g/dL (ref 30.0–36.0)
MCV: 82.7 fL (ref 78.0–100.0)
Platelets: 237 10*3/uL (ref 150–400)
RBC: 4.85 MIL/uL (ref 4.22–5.81)
RDW: 15.4 % (ref 11.5–15.5)
WBC: 6.6 10*3/uL (ref 4.0–10.5)

## 2015-03-29 LAB — TSH: TSH: 3.356 u[IU]/mL (ref 0.350–4.500)

## 2015-03-29 LAB — HEPARIN LEVEL (UNFRACTIONATED): Heparin Unfractionated: 0.61 IU/mL (ref 0.30–0.70)

## 2015-03-29 LAB — T4, FREE: Free T4: 1.27 ng/dL (ref 0.80–1.80)

## 2015-03-29 NOTE — Progress Notes (Signed)
Heart Failure Navigator Consult Note  Presentation: [name] is a 54 yo man with PMH of hypertension, atrial flutter, mild AS, moderate AI and nonischemic cardiomyopathy with MUGA EF ~ 35% after LHC 15-20% after being hospitalized several times for CHF having 12/11-12/12/15 admission for RHC/LHC. He presents today with 2 weeks of progressive shortness of breath. No infectious symptoms - no fever/chills/nausea/vomiting/diarrhea. He has 20 feet dyspnea on exertion. He tells me he sleeps on 8 pillows/recliner. He wakes up SOB from sleep. He received IV lasix in the ER. ECG noted to be in atrial flutter with BNP 2700 and Trop 0.1. No current chest pain.   Past Medical History  Diagnosis Date  . Insomnia   . CHF (congestive heart failure)   . Congenital insufficiency of aortic valve   . Congestive cardiomyopathy   . Atrial flutter   . Benign hypertensive heart and renal disease   . Essential hypertension, malignant   . SOB (shortness of breath)   . Murmur   . Aortic stenosis 11/19/2014  . 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 Benign essential HTN 11/19/2014  . Chronic systolic CHF (congestive heart failure) 11/19/2014    History   Social History  . Marital Status: Single    Spouse Name: N/A  . Number of Children: N/A  . Years of Education: N/A   Social History Main Topics  . Smoking status: Never Smoker   . Smokeless tobacco: Not on file  . Alcohol Use: Not on file  . Drug Use: Not on file  . Sexual Activity: Not on file   Other Topics Concern  . None   Social History Narrative    ECHO: new pending  BNP    Component Value Date/Time   BNP 1849.9* 03/28/2015 0040    ProBNP    Component Value Date/Time   PROBNP 776.0* 11/26/2014 0953     Education Assessment and Provision:  Detailed education and instructions provided on heart failure disease management including the following:  Signs and symptoms of Heart Failure When to call the  physician Importance of daily weights Low sodium diet Fluid restriction Medication management Anticipated future follow-up appointments  Patient education given on each of the above topics.  Patient acknowledges understanding and acceptance of all instructions.  I spoke at length with [name] regarding his HF.  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  He was quite difficult to educate --as he is a bit resistant to recommendations and does seem to minimize his HF.  Nods his head and tells me that he was told all of this information "before in 1994".  He is quite confident that through working out and "getting back to the gym" --he can strengthen his heart.  He is able to teach back most topics above.  He does not currently have a scale --I will attempt to provide one for his home use.  He is adamant that he eats a mostly low sodium diet --and often feels full quite quickly.  I will plan to see him again following TEE to reinforce education.  Education Materials:  "Living Better With Heart Failure" Booklet, Daily Weight Tracker Tool  High Risk Criteria for Readmission and/or Poor Patient Outcomes:   EF <30%-from 2015 30-35%--new echo pending  2 or more admissions in 6 months- No  Difficult social situation- Yes-lives with brother --currently has no income  Demonstrates medication noncompliance- No denies   Barriers of Care:   Knowledge and  compliance.   Discharge Planning:  Plans to return home with brother.  He would benefit from continued education and compliance reinforcement with HHRN.

## 2015-03-29 NOTE — Care Management Note (Signed)
    Page 1 of 2   04/23/2015     4:37:19 PM CARE MANAGEMENT NOTE 04/23/2015  Patient:  [name], [name]   Account Number:  [id number]  Date Initiated:  03/29/2015  Documentation initiated by:  GRAVES-BIGELOW,BRENDA  Subjective/Objective Assessment:   Pt admitted for shortness of breath-CHF. IV Lasix BID. Pt has PCP. CM did discuss with pt if he had a scale at home- pt replied he will have to get one.     Action/Plan:   Pt would benefit from [facility] once medically stable for d/c. CM will monitor for disposition needs.   Anticipated DC Date:  04/23/2015   Anticipated DC Plan:  SKILLED NURSING FACILITY  In-house referral  Clinical Social Worker      DC Forensic scientist  CM consult      [facility] Choice  HOME HEALTH   Choice offered to / List presented to:             Status of service:  Completed, signed off Medicare Important Message given?  NO (If response is "NO", the following Medicare IM given date fields will be blank) Date Medicare IM given:   Medicare IM given by:   Date Additional Medicare IM given:   Additional Medicare IM given by:    Discharge Disposition:  [place]  Per UR Regulation:  Reviewed for med. necessity/level of care/duration of stay  If discussed at [place] of Stay Meetings, dates discussed:   04/01/2015  04/06/2015  04/08/2015  04/13/2015  04/15/2015    Comments:  Minette BrineTrinna Post Brother [phone]  [phone]    [place]   [phone]  04/23/15- [place] RN, BSN [phone] Pt for d/c today, has been given offers for SNF - spoke with pt at bedside- to see if decision had been made SNF vs [facility]- as [place] orders had been placed- pt called his sister Kennyth Lose- [facility] [place]- per CSW- pt's [facility]- pt to d/c there this afternoon-  04/22/15- [place] RN, BSN [phone] Spoke with pt along with [place]- explained to pt his bed offers currenly- still  waiting to hear from any offers in [place]- pt has not been accepted at [facility] or the [facility] in [place]- Asked pt if it was an option to go home with sister - pt states that he would not go to sister's house and has not other family members that he would go to- explained to pt that when MD places discharge order pt will need to decide what his discharge plan is- and that he and his sister will need to have a plan in place-  that insurance would not continue to cover his stay here in the hospital.  04/13/15- 1100- Marvetta Gibbons RN, BSN [phone] Pt re-evaled by speech and seen by ENT- plan to wean TPN - per ENT-Vocal cord paralysis, continue SSLP care, soft diet, thickened liquids-- d/d planning for SNF - CSW following  04-08-15 8:40am Luz Lex, RNBSN - [phone] Progressing slowly - sitting up in chair. Continues on amio and milrinone.  Very hoarse and SOB - difficult to talk. Lives alone, no one home to care for on discharge.  Would like rehab facility [facility],  SW consult placed.  03-31-15 1523 Jacqlyn Krauss, RN,BSN [phone]  Aortic stenosis, severe. Plan for valve replacement/MAZE Monday. CM to monitor for disposition needs.

## 2015-03-29 NOTE — Progress Notes (Addendum)
Patient: [name] / Admit Date: 03/27/2015 / Date of Encounter: 03/29/2015, 11:22 AM   Subjective: LEE improving but not totally resolved. Orthopnea improving. No chest pain. Denies alcohol or drug use. Home meds kept in [employer] bag.   Objective: Telemetry: atrial flutter (slow ventricular response at times while sleeping with transient dips in the 30s), resting HR in the 60s Physical Exam: Blood pressure 119/61, pulse 62, temperature 98.5 F (36.9 C), temperature source Oral, resp. rate 16, height 5\' 11"  (1.803 m), weight 232 lb (105.235 kg), SpO2 96 %. General: Well developed, well nourished AAM in no acute distress. Head: Normocephalic, atraumatic, sclera non-icteric, no xanthomas, nares are without discharge. Neck: JVP not elevated. Lungs: Clear bilaterally to auscultation without wheezes, rales, or rhonchi. Breathing is unlabored. Heart: Irregularly irregular, controlled rate, S1 S2, prominent systolic and diastolic murmur at RUSB. Quiet SEM at apex.   Abdomen: Soft, non-distended with normoactive bowel sounds. Mild RUQ tenderness. Extremities: No clubbing or cyanosis. Mild residual pitting pedal and pretibial edema bilaterally. Neuro: Alert and oriented X 3. Moves all extremities spontaneously. Psych:  Responds to questions appropriately with a normal affect.   Intake/Output Summary (Last 24 hours) at 03/29/15 1122 Last data filed at 03/29/15 1053  Gross per 24 hour  Intake    760 ml  Output   4500 ml  Net  -3740 ml    Inpatient Medications:  . aspirin EC  81 mg Oral Daily  . atorvastatin  40 mg Oral Daily  . clopidogrel  75 mg Oral Daily  . furosemide  60 mg Intravenous BID  . lisinopril  5 mg Oral Daily  . sodium chloride  3 mL Intravenous Q12H   Infusions:  . heparin 1,600 Units/hr (03/29/15 0648)    Labs:  Recent Labs  03/27/15 1920 03/28/15 0702 03/29/15 0519  NA  --  139 140  K  --  4.0 3.6  CL  --  106 104  CO2  --  20 27  GLUCOSE  --   113* 115*  BUN  --  22 20  CREATININE  --  1.48* 1.52*  CALCIUM  --  8.6 8.5  MG 1.9  --   --    No results for input(s): AST, ALT, ALKPHOS, BILITOT, PROT, ALBUMIN in the last 72 hours.  Recent Labs  03/27/15 1720 03/28/15 0702 03/29/15 0519  WBC 7.8 7.1 6.6  NEUTROABS 5.6  --   --   HGB 14.5 13.7 13.0  HCT 43.8 41.3 40.1  MCV 81.7 82.1 82.7  PLT 240 224 237    Recent Labs  03/27/15 1720 03/27/15 1920 03/28/15 0702 03/28/15 1306  TROPONINI 0.11* 0.10* 0.11* 0.11*   Invalid input(s): POCBNP No results for input(s): HGBA1C in the last 72 hours.   Radiology/Studies:  Dg Chest 2 View  03/27/2015   CLINICAL DATA:  Shortness of breath and sinus congestion  EXAM: CHEST  2 VIEW  COMPARISON:  None.  FINDINGS: There is moderate to marked cardiac enlargement. No pleural effusion or edema identified. Scar noted within the left midlung. Both lungs are clear. The visualized skeletal structures are unremarkable.  IMPRESSION: 1. Cardiac enlargement.   Electronically Signed   By: Kerby Moors M.D.   On: 03/27/2015 17:58   Nm Pulmonary Perf And Vent  03/28/2015   CLINICAL DATA:  Shortness of breath and fatigued  EXAM: NUCLEAR MEDICINE VENTILATION - PERFUSION LUNG SCAN  TECHNIQUE: Ventilation images were obtained in multiple projections using inhaled aerosol  technetium 32 M DTPA. Perfusion images were obtained in multiple projections after intravenous injection of Tc-33m MAA.  RADIOPHARMACEUTICALS:  40.0 mCi Tc-45m DTPA aerosol and 6.0 mCi Tc-1m MAA  COMPARISON:  Chest radiograph from 03/27/2015  FINDINGS: Ventilation: No focal ventilation defect. Large area of relative photopenia corresponds to the patient's enlarged cardiac silhouette.  Perfusion: No wedge shaped peripheral perfusion defects to suggest acute pulmonary embolism.  IMPRESSION: Very low probability for acute pulmonary embolus.   Electronically Signed   By: Kerby Moors M.D.   On: 03/28/2015 12:14     Assessment and Plan  22M  with hx of persistent atrial flutter, valvular heart disease, NICM with EF 35%, CKD, HTN admitted with SOB and a/c CHF. Review of records from [facility] 09/2014 indicates he was found to have new CHF at that time (EF 20%), severe AI/severe AS/bicuspid AV/mild-mod MR (actually seen by CT surg with recommendation for valve replacement but patient declined, wanting to proceed with workup in [place]), atrial flutter (not sent home on anticoagulation due to concern for compliance). There was concern expressed that patient did not comprehend severity of his illness. F/u echo 11/2014 here showed EF 35-40%, eccentric AI (mod), mild AS, no mention of MR. EF 15% by LHC which found no significant CAD. EF 35% by MUGA 12/2014. Patient cancelled f/u appts in January and February.   1. Acute on chronic systolic CHF - VQ negative for PE; elevated troponin felt due to demand ischemia given normal cors in 11/2014 - Repeat 2D echo pending - Continue ACEI and follow BP (running on softer side) - BB held due to slow HR this admission - Continue IV Lasix today and consider changing to oral tomorrow - Long discussion with patient - I agree with prior concern in [place] that he does not seem to understand the severity of his cardiovascular disease. He believes that if he continues to work out and eat right these issues will sort themselves out. He inquired if he could continue to do weightlifting and cardio when he feels better. He is able to participate in strenuous activity when he is not in the hospital. 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 However, he reports he plans on pursuing disability as he feels he can no longer work in his condition. Dr. Radford Pax has disagreed with this assessment per outpatient phone notes. 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 Further recs pending outcome of valve w/u. - Regarding his  cancelled appointments in January and February he states he never received a phone call from our office about these appointments, although our office documents that he cancelled due to work on one occasion and schedule conflict on another.  2. Persistent atrial flutter, intermittent slow ventricular response - CHADSVASC at least 2 if not 3 given hyperglycemia - check A1C - at time of diagnosis in 09/2014, was placed on ASA/Plavix rather than anticoagulation due to concern for noncompliance - I discussed indication for anticoagulation with patient including strict need for compliance with meds and follow-up. He verbalizes understanding and willingness but is currently undecided - we will need to elucidate plan for his valvular disease before committing to oral anticoagulation.  - Per d/w Dr. Mare Ferrari will discontinue aspirin and Plavix for now - no indication for these beyond suboptimal stroke prophylaxis, and he is presently covered with IV heparin - repeat TSH normal  3. Valvular heart disease with varying degrees of AS/AI by recent echoes in 2015 - repeat echo pending - d/w Dr. Mare Ferrari - given discrepancy in echoes from 09/2014 & 11/2014 as well as prominent murmur on exam will proceed with TEE tomorrow to further evaluate. Risks/benefits discussed with the patient who is agreeable to proceeding. - the patient does report intermittent sensation of food getting stuck - spoke with speech who recommends DG Esophagus to eval  4. CKD stage II-III, baseline Cr appears 1.4-1.6  Signed, Melina Copa PA-C  The patient was seen with Melina Copa PA-C.  The patient is very complex.  He has objective evidence of significant left ventricular systolic dysfunction.  The degree of aortic insufficiency has varied widely from institution to institution as has his left ventricular ejection fraction.  The patient also admits to having some problems with dysphagia.  Because of this, we will hold off on his TEE until  we are sure that his esophageal problem is not significant.  We will await results of transthoracic echo.  Clinically the patient has improved significantly since admission.  His chest x-ray on admission was reviewed personally.  He has significant cardiomegaly with a boot shaped left ventricle consistent with long-standing severe aortic insufficiency.  I suspect that we will need to move in the direction of aortic valve replacement.  Continue IV heparin for now while we sort out these other problems.  Ideally he should be on long-term anticoagulation for chads risk score of 2 (or 3 if considered for his elevation of blood sugar).

## 2015-03-29 NOTE — Progress Notes (Signed)
Echocardiogram 2D Echocardiogram has been performed.  Shyam Dawson 03/29/2015, 2:35 PM

## 2015-03-29 NOTE — Progress Notes (Signed)
ANTICOAGULATION CONSULT NOTE - Follow Up Consult  Pharmacy Consult for Heparin Indication: Aflutter and concern for PE  No Known Allergies  Patient Measurements: Height: 5\' 11"  (180.3 cm) Weight: 232 lb (105.235 kg) IBW/kg (Calculated) : 75.3 Heparin Dosing Weight: 98 kg  Vital Signs: Temp: 98.5 F (36.9 C) (04/11 0804) Temp Source: Oral (04/11 0804) BP: 119/61 mmHg (04/11 1046) Pulse Rate: 62 (04/11 0804)  Labs:  Recent Labs  03/27/15 1720 03/27/15 1920 03/28/15 0702 03/28/15 1306 03/28/15 1903 03/29/15 0519  HGB 14.5  --  13.7  --   --  13.0  HCT 43.8  --  41.3  --   --  40.1  PLT 240  --  224  --   --  237  HEPARINUNFRC  --   --  0.31  --  0.45 0.61  CREATININE 1.44*  --  1.48*  --   --  1.52*  TROPONINI 0.11* 0.10* 0.11* 0.11*  --   --     Estimated Creatinine Clearance: 68.6 mL/min (by C-G formula based on Cr of 1.52).   Medications:  Heparin @ 1600 units/hr (16 ml/hr)  Assessment: 73 YOM who continues on heparin for Aflutter (CHADSVASC= 2; HF and HTN). Originally also concerned for PE but VQ scan  showed a low probability. Heparin level is at goal (HL= 0.61) and CBC is stable.   Goal of Therapy:  Heparin level 0.3-0.7 units/ml Monitor platelets by anticoagulation protocol: Yes   Plan:  -Continue heparin at the current rate of 1600 units/hr (16 ml/hr) -Daily heparin level and CBC -Will follow anticoagulation plans  Hildred Laser, Pharm D 03/29/2015 11:19 AM

## 2015-03-29 NOTE — Progress Notes (Addendum)
Addendum: Unable to do barium swallow today - radiology requires patient to be NPO. D/w Dr. Mare Ferrari. Will plan NPO after midnight for esophagram tomorrow. Cancel TEE for tomorrow and anticipate doing it the following day if there is no contraindication based on esophageal study.  Nurse made aware to pass on update to patient. Dayna Dunn PA-C

## 2015-03-30 ENCOUNTER — Inpatient Hospital Stay (HOSPITAL_COMMUNITY): Payer: Medicaid Other

## 2015-03-30 ENCOUNTER — Other Ambulatory Visit: Payer: Self-pay | Admitting: *Deleted

## 2015-03-30 ENCOUNTER — Encounter (HOSPITAL_COMMUNITY): Payer: Medicaid Other

## 2015-03-30 DIAGNOSIS — I351 Nonrheumatic aortic (valve) insufficiency: Secondary | ICD-10-CM

## 2015-03-30 DIAGNOSIS — I350 Nonrheumatic aortic (valve) stenosis: Secondary | ICD-10-CM

## 2015-03-30 LAB — URINALYSIS, ROUTINE W REFLEX MICROSCOPIC
Bilirubin Urine: NEGATIVE
Glucose, UA: NEGATIVE mg/dL
Hgb urine dipstick: NEGATIVE
Ketones, ur: NEGATIVE mg/dL
Leukocytes, UA: NEGATIVE
Nitrite: NEGATIVE
Protein, ur: 30 mg/dL — AB
Specific Gravity, Urine: 1.015 (ref 1.005–1.030)
Urobilinogen, UA: 1 mg/dL (ref 0.0–1.0)
pH: 7.5 (ref 5.0–8.0)

## 2015-03-30 LAB — CBC
HCT: 40.6 % (ref 39.0–52.0)
Hemoglobin: 13.3 g/dL (ref 13.0–17.0)
MCH: 27.2 pg (ref 26.0–34.0)
MCHC: 32.8 g/dL (ref 30.0–36.0)
MCV: 83 fL (ref 78.0–100.0)
Platelets: 231 10*3/uL (ref 150–400)
RBC: 4.89 MIL/uL (ref 4.22–5.81)
RDW: 15.3 % (ref 11.5–15.5)
WBC: 6.5 10*3/uL (ref 4.0–10.5)

## 2015-03-30 LAB — URINE MICROSCOPIC-ADD ON

## 2015-03-30 LAB — BASIC METABOLIC PANEL
Anion gap: 7 (ref 5–15)
BUN: 18 mg/dL (ref 6–23)
CO2: 32 mmol/L (ref 19–32)
CREATININE: 1.49 mg/dL — AB (ref 0.50–1.35)
Calcium: 8.9 mg/dL (ref 8.4–10.5)
Chloride: 99 mmol/L (ref 96–112)
GFR calc Af Amer: 60 mL/min — ABNORMAL LOW (ref >90)
GFR, EST NON AFRICAN AMERICAN: 52 mL/min — AB (ref >90)
Glucose, Bld: 111 mg/dL — ABNORMAL HIGH (ref 70–99)
Potassium: 3.8 mmol/L (ref 3.5–5.1)
Sodium: 138 mmol/L (ref 135–145)

## 2015-03-30 LAB — HEMOGLOBIN A1C
Hgb A1c MFr Bld: 6.9 % — ABNORMAL HIGH (ref 4.8–5.6)
Mean Plasma Glucose: 151 mg/dL

## 2015-03-30 LAB — HEPARIN LEVEL (UNFRACTIONATED): HEPARIN UNFRACTIONATED: 0.44 [IU]/mL (ref 0.30–0.70)

## 2015-03-30 LAB — T3, FREE: T3 FREE: 2.2 pg/mL (ref 2.0–4.4)

## 2015-03-30 NOTE — Consult Note (Signed)
[address]       [place] [address]             [phone]        [name] Outlook Medical Record #[id number] Date of Birth: February 03, 1961  Referring: No ref. provider found Primary Care: No primary care provider on file.  Chief Complaint:    Chief Complaint  Patient presents with  . Shortness of Breath   severe aortic insufficiency Moderate aortic stenosis History of atrial flutter   Patient examined, recent 2-D echocardiograms and most recent right and left heart catheterization reviewed. Patient discussed with Dr. Mare Ferrari  History of Present Illness:     54 year old AA male nonsmoker with severe aortic insufficiency diagnosed last fall. He is a long distance truck driver and had been hospitalized in [place] in heart failure with shortness of breath and apparently an echocardiogram was performed and he was told to follow up with his cardiologist here in [place]. His echocardiogram showed moderate gas severe LV dysfunction with EF less than 20%. Follow-up right heart catheterization demonstrated moderate pulmonary hypertension with PA pressures 50/20, wedge of 24, LVEDP 35 and transaortic gradient of 30 mmHg. He had no significant coronary disease. The patient apparently chose to continue with medical therapy. His heart failure symptoms worsen he was admitted through the emergency department a few days ago. He was given IV Lasix and his symptoms have improved. Repeat echocardiogram shows severe aortic insufficiency, minimal mitral regurgitation, EF now 30-35 percent. The aortic valve appears to be functionally bicuspid.     The patient has history of atrial flutter. He apparently was not felt to be a good candidate for Coumadin and was placed on Plavix. After admission at this time his Plavix is been stopped and he is been placed on IV heparin.   The patient has a strong family history of aortic valve disease. Other siblings have had cardiac  murmur. No one in the family has had cardiac valve surgery however.  Patient has a history of hypertension. He has been a nonsmoker. He has no history of rheumatic fever as a child. He denies any bleeding complications from his Plavix therapy. No neurologic symptoms associated with his atrial flutter. There is no history of possible previous endocarditis. The patient does have some painful teeth and has been putting off seeing a dentist. A Panorex has been ordered.   Current Activity/ Functional Status: Patient is separated but has a 53-year-old son. He does not smoke or use drugs. Minimal alcohol use. He is been disabled from work because of his symptoms of heart failure since last fall.    Zubrod Score: At the time of surgery this patient's most appropriate activity status/level should be described as: []     0    Normal activity, no symptoms []     1    Restricted in physical strenuous activity but ambulatory, able to do out light work [x]     2    Ambulatory and capable of self care, unable to do work activities, up and about                 more than 50%  Of the time                            []     3    Only limited self care, in bed greater than 50% of waking hours []   4    Completely disabled, no self care, confined to bed or chair []     5    Moribund  Past Medical History  Diagnosis Date  . Insomnia   . CHF (congestive heart failure)   . Congenital insufficiency of aortic valve   . Congestive cardiomyopathy   . Atrial flutter   . Benign hypertensive heart and renal disease   . Essential hypertension, malignant   . SOB (shortness of breath)   . Murmur   . Aortic stenosis 11/19/2014  . 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 Benign essential HTN 11/19/2014  . Chronic systolic CHF (congestive heart failure) 11/19/2014    Past Surgical History  Procedure Laterality Date  . Left and right heart catheterization with coronary angiogram N/A 11/27/2014    Procedure: LEFT AND  RIGHT HEART CATHETERIZATION WITH CORONARY ANGIOGRAM;  Surgeon: [name], MD;  Location: [facility] CATH LAB;  Service: Cardiovascular;  Laterality: N/A;    History  Smoking status  . Never Smoker   Smokeless tobacco  . Not on file    History  Alcohol Use: Not on file    History   Social History  . Marital Status: Single    Spouse Name: N/A  . Number of Children: N/A  . Years of Education: N/A   Occupational History  . Not on file.   Social History Main Topics  . Smoking status: Never Smoker   . Smokeless tobacco: Not on file  . Alcohol Use: Not on file  . Drug Use: Not on file  . Sexual Activity: Not on file   Other Topics Concern  . Not on file   Social History Narrative    No Known Allergies  Current Facility-Administered Medications  Medication Dose Route Frequency Provider Last Rate Last Dose  . 0.9 %  sodium chloride infusion  250 mL Intravenous PRN Jules Husbands, MD      . acetaminophen (TYLENOL) tablet 650 mg  650 mg Oral Q4H PRN Jules Husbands, MD      . albuterol (PROVENTIL) (2.5 MG/3ML) 0.083% nebulizer solution 3 mL  3 mL Inhalation Q4H PRN Jules Husbands, MD      . atorvastatin (LIPITOR) tablet 40 mg  40 mg Oral Daily Jules Husbands, MD   40 mg at 03/30/15 1056  . furosemide (LASIX) injection 60 mg  60 mg Intravenous BID Jules Husbands, MD   60 mg at 03/30/15 1056  . heparin ADULT infusion 100 units/mL (25000 units/250 mL)  1,600 Units/hr Intravenous Continuous Kris Mouton, RPH 16 mL/hr at 03/29/15 2148 1,600 Units/hr at 03/29/15 2148  . lisinopril (PRINIVIL,ZESTRIL) tablet 5 mg  5 mg Oral Daily Liliane Shi, PA-C   5 mg at 03/30/15 1056  . ondansetron (ZOFRAN) injection 4 mg  4 mg Intravenous Q6H PRN Jules Husbands, MD      . sodium chloride 0.9 % injection 3 mL  3 mL Intravenous Q12H Jules Husbands, MD   3 mL at 03/30/15 1056  . sodium chloride 0.9 % injection 3 mL  3 mL Intravenous PRN Jules Husbands, MD        Prescriptions prior to admission  Medication Sig Dispense  Refill Last Dose  . albuterol (PROVENTIL HFA;VENTOLIN HFA) 108 (90 BASE) MCG/ACT inhaler Inhale 2 puffs into the lungs every 4 (four) hours as needed for wheezing or shortness of breath.   03/27/2015 at Unknown time  . aspirin 81 MG tablet Take 81 mg by mouth daily.   03/27/2015 at  Unknown time  . atorvastatin (LIPITOR) 40 MG tablet Take 40 mg by mouth daily.   03/27/2015 at Unknown time  . carvedilol (COREG) 25 MG tablet Take 1 tablet (25 mg total) by mouth 2 (two) times daily with a meal. 60 tablet 6 03/27/2015 at 200 pm  . clopidogrel (PLAVIX) 75 MG tablet Take 75 mg by mouth daily.   03/27/2015 at Unknown time  . furosemide (LASIX) 40 MG tablet Take 40 mg by mouth 2 (two) times daily.    03/27/2015 at Unknown time  . hydrALAZINE (APRESOLINE) 25 MG tablet Take 37.5 mg by mouth 3 (three) times daily.   03/27/2015 at Unknown time  . potassium chloride SA (KLOR-CON M20) 20 MEQ tablet Take 1 tablet (20 mEq total) by mouth daily. 30 tablet 1 03/27/2015 at Unknown time  . ranitidine (ZANTAC) 150 MG capsule Take 150 mg by mouth daily.   03/27/2015 at Unknown time  . vitamin B-12 (CYANOCOBALAMIN) 500 MCG tablet Take 500 mcg by mouth daily.   03/27/2015 at Unknown time    Family History  Problem Relation Age of Onset  . Heart attack Neg Hx      Review of Systems:  Pertinent items are noted in HPI.     Cardiac Review of Systems: Y or N  Chest Pain [   no  ]  Resting SOB [place]   ] Exertional SOB  [ yes ]  Orthopnea [ yes  ]   Pedal Edema [  no ]    Palpitations yes[  ] Syncope  [ no ]   Presyncope [  no  ]  General Review of Systems: [Y] = yes [  ]=no Constitional: recent weight change [  ]; anorexia [  ]; fatigue [  ]; nausea [  ]; night sweats [  ]; fever [  ]; or chills [  ]                                                               Dental: poor dentition[  yes ]; Last Dentist visit:  greater than 2 years   Eye : blurred vision [  ]; diplopia [   ]; vision changes [  ];  Amaurosis fugax[  ]; Resp: cough [  ];   wheezing[  ];  hemoptysis[  ]; shortness of breath[ yes  ]; paroxysmal nocturnal dyspnea[ yes  ]; dyspnea on exertion[  yes ]; or orthopnea[  yes ];  GI:  gallstones[  ], vomiting[  ];  dysphagia[  ]; melena[  ];  hematochezia [  ]; heartburn[ yes  ];   Hx of  Colonoscopy[  ]; GU: kidney stones [  ]; hematuria[  ];   dysuria [  ];  nocturia[  ];  history of     obstruction [  ]; urinary frequency [  ]             Skin: rash, swelling[  ];, hair loss[  ];  peripheral edema[  ];  or itching[  ]; Musculosketetal: myalgias[  ];  joint swelling[  ];  joint erythema[  ];  joint pain[  ];  back pain[  ];  Heme/Lymph: bruising[  ];  bleeding[  ];  anemia[  ];  Neuro: TIA[  no ];  headaches[  ];  stroke[  ];  vertigo[  ];  seizures[  ];   paresthesias[  ];  difficulty walking[  ];  Psych:depression[  ]; anxiety[  ];  Endocrine: diabetes[ no  ];  thyroid dysfunction[  ];  Immunizations: Flu [  ]; Pneumococcal[  ];  Other: the patient is right-hand dominant, he denies previous thoracic trauma rib fracture or pneumothorax   Physical Exam: BP 132/55 mmHg  Pulse 62  Temp(Src) 99.2 F (37.3 C) (Oral)  Resp 20  Ht 5\' 11"  (1.803 m)  Wt 228 lb (103.42 kg)  BMI 31.81 kg/m2  SpO2 99%   General: Middle-age  AA MALE IN NO ACUTE DISTRESS BUT ANXIOUS  HEENT: Normocephalic pupils equal , dentition adequate Neck: Supple without JVD, adenopathy, or bruit Chest: Clear to auscultation, symmetrical breath sounds, no rhonchi, no tenderness             or deformity Cardiovascular: Regular rate and rhythm, grade 3/6 systolic murmur grade 2/6 diastolic murmur left lower sternal border, peripheral pulses             palpable in all extremities Abdomen:  Soft, nontender, no palpable mass or organomegaly Extremities: Warm, well-perfused, no clubbing cyanosis edema or tenderness,              no venous stasis changes of the legs Rectal/GU: Deferred Neuro: Grossly non--focal and symmetrical throughout Skin: Clean and  dry without rash or ulceration     Diagnostic Studies & Laboratory data: cardiac catheterization and echocardiograms personally reviewed      Recent Radiology Findings:   Dg Esophagus  03/30/2015   CLINICAL DATA:  Dysphagia.  EXAM: ESOPHOGRAM/BARIUM SWALLOW  TECHNIQUE: Combined double contrast and single contrast examination performed using effervescent crystals, thick barium liquid, and thin barium liquid.  FLUOROSCOPY TIME:  Radiation Exposure Index (as provided by the fluoroscopic device): 110.55 uGy m2  If the device does not provide the exposure index:  Fluoroscopy Time:  2 min and 2 seconds.  Number of Acquired Images:  COMPARISON:  None.  FINDINGS: Initial barium swallows demonstrate normal pharyngeal motion with swallowing. No laryngeal penetration or aspiration. No upper esophageal webs, strictures or diverticuli.  Normal esophageal motility. No intrinsic or extrinsic mass lesions. No mucosal abnormalities. Small sliding-type hiatal hernia with inducible GE reflux with water swallowing. No mass or stricture.  IMPRESSION: Small sliding-type hiatal hernia and inducible GE reflux but no mass or stricture.   Electronically Signed   By: Marijo Sanes M.D.   On: 03/30/2015 10:27     I have independently reviewed the above radiologic studies.  Recent Lab Findings: Lab Results  Component Value Date   WBC 6.5 03/30/2015   HGB 13.3 03/30/2015   HCT 40.6 03/30/2015   PLT 231 03/30/2015   GLUCOSE 111* 03/30/2015   NA 138 03/30/2015   K 3.8 03/30/2015   CL 99 03/30/2015   CREATININE 1.49* 03/30/2015   BUN 18 03/30/2015   CO2 32 03/30/2015   TSH 3.356 03/29/2015   INR 1.4* 11/26/2014   HGBA1C 6.9* 03/29/2015      Assessment / Plan:      severe aortic insufficiency with recurrent heart failure, moderate LV dysfunction     history of atrial flutter        Hypertension      Renal insufficiency, mild     patient would benefit from aortic valve replacement for treatment of his severe  AI with recurrent heart failure and LV dysfunction. Combined  maze procedure for his atrial flutter would be considered. Prior to surgery patient weaned ACT of the chest without contrast to evaluate for ascending thoracic aortic dilatation. Patient will need dental x-rays and dental evaluation prior to elective valve surgery. I will call the dental office for consultation.   I strongly recommended to the patient that he stay hospitalized until the date of aortic valve replacement. I suspect the patient would be a marginal candidate for chronic Coumadin therapy as he was not placed on Coumadin but rather Plavix for his atrial flutter. Pericardial tissue valve would probably be the best option but this will be further discussed with the patient.   @ME1 @ 03/30/2015 2:58 PM

## 2015-03-30 NOTE — Progress Notes (Signed)
Inpatient Diabetes Program Recommendations  AACE/ADA: New Consensus Statement on Inpatient Glycemic Control (2013)  Target Ranges:  Prepandial:   less than 140 mg/dL      Peak postprandial:   less than 180 mg/dL (1-2 hours)      Critically ill patients:  140 - 180 mg/dL   Diabetes history: none Current orders for Inpatient glycemic control: None  Inpatient Diabetes Program Recommendations HgbA1C: Patient's A1c was 6.9%. Per ADA meets criteria for DM diagnosis. If patient is to be newley diagnosed, please place consult for DM team and Dietitian so we can start lifestyle education and management for discharge.  Thanks,  Tama Headings RN, MSN, [facility] Inpatient Diabetes Coordinator Team Pager [phone]

## 2015-03-30 NOTE — Progress Notes (Signed)
Patient Name: [name] Date of Encounter: 03/30/2015     Principal Problem:   Acute on chronic systolic heart failure Active Problems:   Aortic insufficiency   Congestive dilated cardiomyopathy   Essential hypertension   CKD (chronic kidney disease), stage II-III   Atrial flutter   Elevated troponin   Aortic stenosis   Bicuspid aortic valve    SUBJECTIVE  Denies chest pain or shortness of breath today.  Rhythm remains atrial flutter with intrinsic ventricular response. Awaiting barium swallow today to evaluate complaint of dysphagia.  CURRENT MEDS . atorvastatin  40 mg Oral Daily  . furosemide  60 mg Intravenous BID  . lisinopril  5 mg Oral Daily  . sodium chloride  3 mL Intravenous Q12H    OBJECTIVE  Filed Vitals:   03/29/15 2000 03/29/15 2357 03/30/15 0328 03/30/15 0400  BP: 122/49 117/53  120/98  Pulse: 80 70  65  Temp: 99.2 F (37.3 C) 98.9 F (37.2 C)  98.4 F (36.9 C)  TempSrc:    Oral  Resp: 18 16  20   Height:      Weight:    228 lb (103.42 kg)  SpO2: 99% 99% 98% 99%    Intake/Output Summary (Last 24 hours) at 03/30/15 0755 Last data filed at 03/30/15 0700  Gross per 24 hour  Intake    832 ml  Output   4175 ml  Net  -3343 ml   Filed Weights   03/28/15 0020 03/29/15 0400 03/30/15 0400  Weight: 238 lb (107.956 kg) 232 lb (105.235 kg) 228 lb (103.42 kg)    PHYSICAL EXAM  General: Pleasant, NAD. Neuro: Alert and oriented X 3. Moves all extremities spontaneously. Psych: Normal affect. HEENT:  Normal  Neck: Supple without bruits or JVD. Lungs:  Resp regular and unlabored, CTA. Heart: Irregular rhythm.  Grade 3/6 decrescendo murmur of aortic insufficiency.  Grade 2/6 systolic ejection murmur at base.  No S3 gallop Abdomen: Soft, non-tender, non-distended, BS + x 4.  Extremities: No clubbing, cyanosis or edema. DP/PT/Radials 2+ and equal bilaterally.  Accessory Clinical Findings  CBC  Recent Labs  03/27/15 1720  03/29/15 0519  03/30/15 0600  WBC 7.8  < > 6.6 6.5  NEUTROABS 5.6  --   --   --   HGB 14.5  < > 13.0 13.3  HCT 43.8  < > 40.1 40.6  MCV 81.7  < > 82.7 83.0  PLT 240  < > 237 231  < > = values in this interval not displayed. Basic Metabolic Panel  Recent Labs  03/27/15 1920  03/29/15 0519 03/30/15 0600  NA  --   < > 140 138  K  --   < > 3.6 3.8  CL  --   < > 104 99  CO2  --   < > 27 32  GLUCOSE  --   < > 115* 111*  BUN  --   < > 20 18  CREATININE  --   < > 1.52* 1.49*  CALCIUM  --   < > 8.5 8.9  MG 1.9  --   --   --   < > = values in this interval not displayed. Liver Function Tests No results for input(s): AST, ALT, ALKPHOS, BILITOT, PROT, ALBUMIN in the last 72 hours. No results for input(s): LIPASE, AMYLASE in the last 72 hours. Cardiac Enzymes  Recent Labs  03/27/15 1920 03/28/15 0702 03/28/15 1306  TROPONINI 0.10* 0.11* 0.11*   BNP  Invalid input(s): POCBNP D-Dimer No results for input(s): DDIMER in the last 72 hours. Hemoglobin A1C  Recent Labs  03/29/15 0519  HGBA1C 6.9*   Fasting Lipid Panel No results for input(s): CHOL, HDL, LDLCALC, TRIG, CHOLHDL, LDLDIRECT in the last 72 hours. Thyroid Function Tests  Recent Labs  03/29/15 0519  TSH 3.356  T3FREE 2.2    TELE  Atrial flutter with slow ventricular response.  He is not on any AV blocking drugs  ECG  2-D echo - Left ventricle: The cavity size was moderately dilated. There was moderate concentric hypertrophy. Systolic function was moderately to severely reduced. The estimated ejection fraction was in the range of 30% to 35%. Diffuse hypokinesis with no identifiable regional variations. The study is not technically sufficient to allow evaluation of LV diastolic function. Doppler parameters are consistent with elevated mean left atrial filling pressure. - Aortic valve: Possibly bicuspid; mildly thickened, mildly calcified leaflets. There was moderate to severe regurgitation directed  eccentrically in the LVOT and towards the mitral anterior leaflet. - Left atrium: The atrium was severely dilated. - Right atrium: The atrium was severely dilated. - Pulmonary arteries: PA peak pressure: 42 mm Hg (S). Radiology/Studies  Dg Chest 2 View  03/27/2015   CLINICAL DATA:  Shortness of breath and sinus congestion  EXAM: CHEST  2 VIEW  COMPARISON:  None.  FINDINGS: There is moderate to marked cardiac enlargement. No pleural effusion or edema identified. Scar noted within the left midlung. Both lungs are clear. The visualized skeletal structures are unremarkable.  IMPRESSION: 1. Cardiac enlargement.   Electronically Signed   By: Kerby Moors M.D.   On: 03/27/2015 17:58   Nm Pulmonary Perf And Vent  03/28/2015   CLINICAL DATA:  Shortness of breath and fatigued  EXAM: NUCLEAR MEDICINE VENTILATION - PERFUSION LUNG SCAN  TECHNIQUE: Ventilation images were obtained in multiple projections using inhaled aerosol technetium 99 M DTPA. Perfusion images were obtained in multiple projections after intravenous injection of Tc-65m MAA.  RADIOPHARMACEUTICALS:  40.0 mCi Tc-64m DTPA aerosol and 6.0 mCi Tc-87m MAA  COMPARISON:  Chest radiograph from 03/27/2015  FINDINGS: Ventilation: No focal ventilation defect. Large area of relative photopenia corresponds to the patient's enlarged cardiac silhouette.  Perfusion: No wedge shaped peripheral perfusion defects to suggest acute pulmonary embolism.  IMPRESSION: Very low probability for acute pulmonary embolus.   Electronically Signed   By: Kerby Moors M.D.   On: 03/28/2015 12:14    ASSESSMENT AND PLAN  1.  Severe aortic stenosis and insufficiency.  Possible bicuspid aortic valve 2.  Atrial flutter.  Currently on IV heparin.  Prior to admission was not on anticoagulation other than aspirin and Plavix. 3.  Left ventricular systolic dysfunction with significant cardiomegaly on x-ray 4.  Dysphagia.  Barium swallow pending  Plan: I believe that he needs to  have aortic valve replacement.  I believe that his left ventricular dysfunction is secondary to long-standing aortic valve disease.  He does not have coronary artery disease by heart catheterization in December 2015. I have recommended that he consider aortic valve replacement.  I have asked Dr. Darcey Nora to see him. Signed, Darlin Coco MD

## 2015-03-30 NOTE — Progress Notes (Signed)
ANTICOAGULATION CONSULT NOTE - Follow Up Consult  Pharmacy Consult for Heparin Indication: Aflutter and concern for PE  No Known Allergies  Patient Measurements: Height: 5\' 11"  (180.3 cm) Weight: 228 lb (103.42 kg) IBW/kg (Calculated) : 75.3 Heparin Dosing Weight: 98 kg  Vital Signs: Temp: 98.5 F (36.9 C) (04/12 0854) Temp Source: Axillary (04/12 0854) BP: 153/61 mmHg (04/12 0854) Pulse Rate: 98 (04/12 0854)  Labs:  Recent Labs  03/27/15 1920  03/28/15 0702 03/28/15 1306 03/28/15 1903 03/29/15 0519 03/30/15 0600  HGB  --   --  13.7  --   --  13.0 13.3  HCT  --   --  41.3  --   --  40.1 40.6  PLT  --   --  224  --   --  237 231  HEPARINUNFRC  --   < > 0.31  --  0.45 0.61 0.44  CREATININE  --   --  1.48*  --   --  1.52* 1.49*  TROPONINI 0.10*  --  0.11* 0.11*  --   --   --   < > = values in this interval not displayed.  Estimated Creatinine Clearance: 69.3 mL/min (by C-G formula based on Cr of 1.49).   Medications:  Heparin @ 1600 units/hr (16 ml/hr)  Assessment: 25 YOM who continues on heparin for Aflutter (CHADSVASC= 3; HF, HTN, DM). Originally also concerned for PE but VQ scan  showed a low probability. Heparin level is at goal (HL= 0.44) and CBC is stable. He is noted with severe aortic stenosis and CVTS consulted.   Goal of Therapy:  Heparin level 0.3-0.7 units/ml Monitor platelets by anticoagulation protocol: Yes   Plan:  -Continue heparin at the current rate of 1600 units/hr -Daily heparin level and CBC -Will follow anticoagulation plans  Hildred Laser, Pharm D 03/30/2015 10:17 AM

## 2015-03-31 LAB — CBC
HCT: 43.6 % (ref 39.0–52.0)
HEMOGLOBIN: 14 g/dL (ref 13.0–17.0)
MCH: 27 pg (ref 26.0–34.0)
MCHC: 32.1 g/dL (ref 30.0–36.0)
MCV: 84.2 fL (ref 78.0–100.0)
PLATELETS: 253 10*3/uL (ref 150–400)
RBC: 5.18 MIL/uL (ref 4.22–5.81)
RDW: 15.6 % — AB (ref 11.5–15.5)
WBC: 7.2 10*3/uL (ref 4.0–10.5)

## 2015-03-31 LAB — COMPREHENSIVE METABOLIC PANEL
ALT: 33 U/L (ref 0–53)
AST: 26 U/L (ref 0–37)
Albumin: 3.3 g/dL — ABNORMAL LOW (ref 3.5–5.2)
Alkaline Phosphatase: 104 U/L (ref 39–117)
Anion gap: 14 (ref 5–15)
BUN: 17 mg/dL (ref 6–23)
CO2: 26 mmol/L (ref 19–32)
Calcium: 9 mg/dL (ref 8.4–10.5)
Chloride: 98 mmol/L (ref 96–112)
Creatinine, Ser: 1.48 mg/dL — ABNORMAL HIGH (ref 0.50–1.35)
GFR calc Af Amer: 60 mL/min — ABNORMAL LOW (ref >90)
GFR calc non Af Amer: 52 mL/min — ABNORMAL LOW (ref >90)
Glucose, Bld: 108 mg/dL — ABNORMAL HIGH (ref 70–99)
Potassium: 3.9 mmol/L (ref 3.5–5.1)
Sodium: 138 mmol/L (ref 135–145)
Total Bilirubin: 1.6 mg/dL — ABNORMAL HIGH (ref 0.3–1.2)
Total Protein: 6.8 g/dL (ref 6.0–8.3)

## 2015-03-31 LAB — HEPARIN LEVEL (UNFRACTIONATED): Heparin Unfractionated: 0.43 IU/mL (ref 0.30–0.70)

## 2015-03-31 LAB — PROTIME-INR
INR: 1.23 (ref 0.00–1.49)
Prothrombin Time: 15.6 seconds — ABNORMAL HIGH (ref 11.6–15.2)

## 2015-03-31 LAB — PLATELET INHIBITION P2Y12: Platelet Function  P2Y12: 184 [PRU] — ABNORMAL LOW (ref 194–418)

## 2015-03-31 MED ORDER — LIVING WELL WITH DIABETES BOOK
Freq: Once | Status: AC
  Administered 2015-03-31: 11:00:00
  Filled 2015-03-31: qty 1

## 2015-03-31 MED ORDER — FUROSEMIDE 40 MG PO TABS
40.0000 mg | ORAL_TABLET | Freq: Two times a day (BID) | ORAL | Status: DC
  Administered 2015-03-31 – 2015-04-04 (×9): 40 mg via ORAL
  Filled 2015-03-31 (×9): qty 1

## 2015-03-31 NOTE — Progress Notes (Signed)
Pt refused ABG. Pts nurse Kathi Der notified.

## 2015-03-31 NOTE — Progress Notes (Signed)
ANTICOAGULATION CONSULT NOTE - Follow Up Consult  Pharmacy Consult for Heparin Indication: Aflutter and concern for PE  No Known Allergies  Patient Measurements: Height: 5\' 11"  (180.3 cm) Weight: 225 lb 9.6 oz (102.331 kg) IBW/kg (Calculated) : 75.3 Heparin Dosing Weight: 98 kg  Vital Signs: Temp: 97.8 F (36.6 C) (04/13 0533) Temp Source: Oral (04/13 0533) BP: 154/56 mmHg (04/13 0533) Pulse Rate: 66 (04/13 0533)  Labs:  Recent Labs  03/28/15 1306  03/29/15 0519 03/30/15 0600 03/31/15 0455 03/31/15 0500  HGB  --   < > 13.0 13.3 14.0  --   HCT  --   --  40.1 40.6 43.6  --   PLT  --   --  237 231 253  --   HEPARINUNFRC  --   < > 0.61 0.44  --  0.43  CREATININE  --   --  1.52* 1.49* 1.48*  --   TROPONINI 0.11*  --   --   --   --   --   < > = values in this interval not displayed.  Estimated Creatinine Clearance: 69.5 mL/min (by C-G formula based on Cr of 1.48).   Medications:  Heparin @ 1600 units/hr (16 ml/hr)  Assessment: 19 YOM who continues on heparin for Aflutter (CHADSVASC= 3; HF, HTN, DM). Originally also concerned for PE but VQ scan  showed a low probability. Heparin level is at goal (HL= 0.43) and CBC is stable. He is noted with severe aortic stenosis and CVTS consulted.  Goal of Therapy:  Heparin level 0.3-0.7 units/ml Monitor platelets by anticoagulation protocol: Yes   Plan:  -Continue heparin at the current rate of 1600 units/hr -Daily heparin level and CBC  Salome Arnt, PharmD, BCPS Pager # [phone] 03/31/2015 8:52 AM

## 2015-03-31 NOTE — Progress Notes (Signed)
CARDIAC REHAB PHASE I   PRE:  Rate/Rhythm: 64 aflutter    BP: sitting 132/66    SaO2: 94 RA  MODE:  Ambulation: 500 ft   POST:  Rate/Rhythm: 90 aflutter    BP: sitting 131/55     SaO2: 98 RA  Tolerated well, no c/o. Feels much better. Long discussion of OHS, sternal precautions, mobility, d/c plan, IS and walking preop. Pt asked many questions and is quite nervous about surgery. Also doesn't have consistent support at d/c. Pt sts he will work on it. Pt did make mention that he doesn't want a "cow or pig valve in him". Wanted to see pictures in OHS book. Encouraged him to watch preop video and walk at easy pace next few days.  [phone]   Josephina Shih Allouez CES, ACSM 03/31/2015 3:56 PM

## 2015-03-31 NOTE — Progress Notes (Signed)
Consult Note:   Spoke with pt about new diabetes diagnosis. Discussed A1C results (6.9% on 03/29/15) and explained what an A1C is, basic pathophysiology of DM Type 2, basic home care, importance of checking CBGs and maintaining good CBG control to prevent long-term and short-term complications. Patient responded to the DM diagnosis as "I don't claim that." Patient laid in beds with eyes shut glancing up once in awhile during our conversation. Reviewed signs and symptoms of hyperglycemia and hypoglycemia along with treatment for both. I am not sure if patient grasps the diagnosis. I spoke with patient about impact of nutrition, exercise, stress, and sickness on diabetes control. Discussed carbohydrates, carbohydrate goals per day and meal, along with portion sizes.  RNs to provide ongoing basic DM education at bedside with this patient and engage patient to actively check blood glucose. Have ordered educational booklet, RD Consult, and DM videos.  Thanks,  Tama Headings RN, MSN, [facility] Inpatient Diabetes Coordinator Team Pager [phone]

## 2015-03-31 NOTE — Progress Notes (Signed)
Subjective: No dizziness, CP, SOB, PND  Objective: Vital signs in last 24 hours: Temp:  [97.7 F (36.5 C)-99.2 F (37.3 C)] 97.8 F (36.6 C) (04/13 0533) Pulse Rate:  [54-98] 66 (04/13 0533) Resp:  [18-20] 18 (04/13 0533) BP: (113-154)/(46-82) 154/56 mmHg (04/13 0533) SpO2:  [97 %-100 %] 98 % (04/13 0533) Last BM Date: 03/31/15  Intake/Output from previous day: 04/12 0701 - 04/13 0700 In: 636 [P.O.:300; I.V.:336] Out: 3200 [Urine:3200] Intake/Output this shift: Total I/O In: -  Out: 200 [Urine:200]  Medications Current Facility-Administered Medications  Medication Dose Route Frequency Provider Last Rate Last Dose  . 0.9 %  sodium chloride infusion  250 mL Intravenous PRN Jules Husbands, MD      . acetaminophen (TYLENOL) tablet 650 mg  650 mg Oral Q4H PRN Jules Husbands, MD      . albuterol (PROVENTIL) (2.5 MG/3ML) 0.083% nebulizer solution 3 mL  3 mL Inhalation Q4H PRN Jules Husbands, MD      . atorvastatin (LIPITOR) tablet 40 mg  40 mg Oral Daily Jules Husbands, MD   40 mg at 03/30/15 1056  . furosemide (LASIX) injection 60 mg  60 mg Intravenous BID Jules Husbands, MD   60 mg at 03/30/15 1854  . heparin ADULT infusion 100 units/mL (25000 units/250 mL)  1,600 Units/hr Intravenous Continuous Kris Mouton, RPH 16 mL/hr at 03/30/15 2100 1,600 Units/hr at 03/30/15 2100  . lisinopril (PRINIVIL,ZESTRIL) tablet 5 mg  5 mg Oral Daily Liliane Shi, PA-C   5 mg at 03/30/15 1056  . ondansetron (ZOFRAN) injection 4 mg  4 mg Intravenous Q6H PRN Jules Husbands, MD      . sodium chloride 0.9 % injection 3 mL  3 mL Intravenous Q12H Jules Husbands, MD   3 mL at 03/30/15 1056  . sodium chloride 0.9 % injection 3 mL  3 mL Intravenous PRN Jules Husbands, MD        PE: General appearance: alert, cooperative, no distress and Sitting up eating breakfast Lungs: clear to auscultation bilaterally Heart: irregularly irregular rhythm and 3/6 sys/diastolic MM.  Abdomen: soft, non-tender; bowel sounds normal; no  masses,  no organomegaly Extremities: Trace LEE Pulses: 2+ and symmetric Skin: Warm and dry Neurologic: Grossly normal  Lab Results:   Recent Labs  03/29/15 0519 03/30/15 0600 03/31/15 0455  WBC 6.6 6.5 7.2  HGB 13.0 13.3 14.0  HCT 40.1 40.6 43.6  PLT 237 231 253   BMET  Recent Labs  03/29/15 0519 03/30/15 0600 03/31/15 0455  NA 140 138 138  K 3.6 3.8 3.9  CL 104 99 98  CO2 27 32 26  GLUCOSE 115* 111* 108*  BUN 20 18 17   CREATININE 1.52* 1.49* 1.48*  CALCIUM 8.5 8.9 9.0   Echo. Study Conclusions  - Left ventricle: The cavity size was moderately dilated. There was moderate concentric hypertrophy. Systolic function was moderately to severely reduced. The estimated ejection fraction was in the range of 30% to 35%. Diffuse hypokinesis with no identifiable regional variations. The study is not technically sufficient to allow evaluation of LV diastolic function. Doppler parameters are consistent with elevated mean left atrial filling pressure. - Aortic valve: Possibly bicuspid; mildly thickened, mildly calcified leaflets. There was moderate to severe regurgitation directed eccentrically in the LVOT and towards the mitral anterior leaflet. - Left atrium: The atrium was severely dilated. - Right atrium: The atrium was severely dilated. - Pulmonary arteries: PA peak pressure: 42 mm Hg (S).  Assessment/Plan  Principal  Problem:   Acute on chronic systolic heart failure Net fluids: -2.6L/-11.0L.  Lasix 60mg IV BID currently.  SCr stable.  On 5mg  of lisinopril.  He looks close to euvolemic.  Consider changing to PO today.      Aortic stenosis, severe  Plan for valve replacement/MAZE.  CT- 4.9 ascending thoracic aortic aneurysm   Aortic insufficiency, moderate to severe   Congestive dilated cardiomyopathy, EF 30-35%   Essential hypertension  Controlled for the most part.    CKD (chronic kidney disease), stage II-III   Atrial flutter On iv heparin,  TSH T3/4 WNL.  Bradycardiac into the 30's at night.  Otherwise, rate controlled.  No beta blocker.     Elevated troponin, 0.11.     Bicuspid aortic valve   DM2-new diagnosis.    A1C 6.9. On no meds.  Morning glucose ~110.  We discussed dietary changes and exercise once valve replaced.  See by DM coordinator.    Dysphagia. Barium swallow:  Small sliding-type hiatal hernia and inducible GE reflux but no mass or stricture    LOS: 4 days    Tarri Fuller PA-C 03/31/2015 7:46 AM  Appreciate Dr. Lucianne Lei Trigt's consult yesterday.  I agree with him that the patient may not be a good long-term Coumadin candidate because of compliance issues and may do better with a tissue valve. The patient is not experiencing any chest discomfort.  No dyspnea at rest.  We will convert him to oral Lasix today.  Renal function is stable.

## 2015-04-01 DIAGNOSIS — K036 Deposits [accretions] on teeth: Secondary | ICD-10-CM

## 2015-04-01 DIAGNOSIS — I509 Heart failure, unspecified: Secondary | ICD-10-CM

## 2015-04-01 DIAGNOSIS — I351 Nonrheumatic aortic (valve) insufficiency: Secondary | ICD-10-CM

## 2015-04-01 DIAGNOSIS — K0530 Chronic periodontitis, unspecified: Secondary | ICD-10-CM

## 2015-04-01 LAB — BASIC METABOLIC PANEL
Anion gap: 10 (ref 5–15)
BUN: 15 mg/dL (ref 6–23)
CHLORIDE: 103 mmol/L (ref 96–112)
CO2: 27 mmol/L (ref 19–32)
Calcium: 9 mg/dL (ref 8.4–10.5)
Creatinine, Ser: 1.26 mg/dL (ref 0.50–1.35)
GFR, EST AFRICAN AMERICAN: 73 mL/min — AB (ref >90)
GFR, EST NON AFRICAN AMERICAN: 63 mL/min — AB (ref >90)
Glucose, Bld: 106 mg/dL — ABNORMAL HIGH (ref 70–99)
Potassium: 4.2 mmol/L (ref 3.5–5.1)
SODIUM: 140 mmol/L (ref 135–145)

## 2015-04-01 LAB — CBC
HCT: 42.1 % (ref 39.0–52.0)
Hemoglobin: 13.7 g/dL (ref 13.0–17.0)
MCH: 27.1 pg (ref 26.0–34.0)
MCHC: 32.5 g/dL (ref 30.0–36.0)
MCV: 83.2 fL (ref 78.0–100.0)
PLATELETS: 255 10*3/uL (ref 150–400)
RBC: 5.06 MIL/uL (ref 4.22–5.81)
RDW: 15.3 % (ref 11.5–15.5)
WBC: 6 10*3/uL (ref 4.0–10.5)

## 2015-04-01 LAB — HEPARIN LEVEL (UNFRACTIONATED): HEPARIN UNFRACTIONATED: 0.7 [IU]/mL (ref 0.30–0.70)

## 2015-04-01 NOTE — Consult Note (Signed)
DENTAL CONSULTATION  Date of Consultation:  04/01/2015 Patient Name:   [name] Date of Birth:   [birth year] Medical Record Number: [id number]  VITALS: BP 132/56 mmHg  Pulse 64  Temp(Src) 98.5 F (36.9 C) (Oral)  Resp 18  Ht 5\' 11"  (1.803 m)  Wt 225 lb 12.8 oz (102.422 kg)  BMI 31.51 kg/m2  SpO2 97%  CHIEF COMPLAINT: The patient was referred by Dr. Tharon Aquas Trigt for dental consultation.   HPI: [name] is a 54 year old male recently diagnosed with aortic insufficiency and heart failure. Patient with anticipated aortic valve replacement with Dr. Tharon Aquas Trigt. Patient is now seen as part of a pre-heart valve surgery dental protocol examination.  The patient currently denies acute toothaches, swellings, or abscesses. Patient indicates that has been more than 2 years since he last saw a a dentist. Patient had a dental cleaning at that time.   PROBLEM LIST: Patient Active Problem List   Diagnosis Date Noted  . Aortic stenosis 03/29/2015  . Bicuspid aortic valve 03/29/2015  . Elevated troponin 03/28/2015  . Acute on chronic systolic heart failure 42/68/3419  . Weakness generalized 11/27/2014  . Aortic insufficiency 11/19/2014  . 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 Essential hypertension 11/19/2014  . 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 Chronic systolic CHF (congestive heart failure) 11/19/2014  . SOB (shortness of breath) 11/19/2014  . Atrial flutter 11/19/2014    PMH: Past Medical History  Diagnosis Date  . Insomnia   . CHF (congestive heart failure)   . Congenital insufficiency of aortic valve   . Congestive cardiomyopathy   . Atrial flutter   . Benign hypertensive heart and renal disease   . Essential hypertension, malignant   . SOB (shortness of breath)   . Murmur   . Aortic stenosis 11/19/2014  . 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 Benign essential HTN 11/19/2014  . Chronic systolic CHF (congestive  heart failure) 11/19/2014    PSH: Past Surgical History  Procedure Laterality Date  . Left and right heart catheterization with coronary angiogram N/A 11/27/2014    Procedure: LEFT AND RIGHT HEART CATHETERIZATION WITH CORONARY ANGIOGRAM;  Surgeon: [name], MD;  Location: [facility] CATH LAB;  Service: Cardiovascular;  Laterality: N/A;    ALLERGIES: No Known Allergies  MEDICATIONS: Current Facility-Administered Medications  Medication Dose Route Frequency Provider Last Rate Last Dose  . 0.9 %  sodium chloride infusion  250 mL Intravenous PRN Jules Husbands, MD 10 mL/hr at 03/31/15 1830 250 mL at 03/31/15 1830  . acetaminophen (TYLENOL) tablet 650 mg  650 mg Oral Q4H PRN Jules Husbands, MD      . albuterol (PROVENTIL) (2.5 MG/3ML) 0.083% nebulizer solution 3 mL  3 mL Inhalation Q4H PRN Jules Husbands, MD      . atorvastatin (LIPITOR) tablet 40 mg  40 mg Oral Daily Jules Husbands, MD   40 mg at 03/31/15 1005  . furosemide (LASIX) tablet 40 mg  40 mg Oral BID Darlin Coco, MD   40 mg at 03/31/15 1750  . heparin ADULT infusion 100 units/mL (25000 units/250 mL)  1,600 Units/hr Intravenous Continuous Kris Mouton, RPH 16 mL/hr at 04/01/15 0349 1,600 Units/hr at 04/01/15 0349  . lisinopril (PRINIVIL,ZESTRIL) tablet 5 mg  5 mg Oral Daily Liliane Shi, PA-C   5 mg at 03/31/15 1005  . ondansetron (ZOFRAN) injection 4 mg  4 mg Intravenous Q6H PRN Jules Husbands, MD      . sodium  chloride 0.9 % injection 3 mL  3 mL Intravenous Q12H Jules Husbands, MD   3 mL at 03/30/15 1056  . sodium chloride 0.9 % injection 3 mL  3 mL Intravenous PRN Jules Husbands, MD        LABS: Lab Results  Component Value Date   WBC 6.0 04/01/2015   HGB 13.7 04/01/2015   HCT 42.1 04/01/2015   MCV 83.2 04/01/2015   PLT 255 04/01/2015      Component Value Date/Time   NA 140 04/01/2015 0600   K 4.2 04/01/2015 0600   CL 103 04/01/2015 0600   CO2 27 04/01/2015 0600   GLUCOSE 106* 04/01/2015 0600   BUN 15 04/01/2015 0600    CREATININE 1.26 04/01/2015 0600   CALCIUM 9.0 04/01/2015 0600   GFRNONAA 63* 04/01/2015 0600   GFRAA 73* 04/01/2015 0600   Lab Results  Component Value Date   INR 1.23 03/31/2015   INR 1.4* 11/26/2014   No results found for: PTT  SOCIAL HISTORY: History   Social History  . Marital Status: Single    Spouse Name: N/A  . Number of Children: N/A  . Years of Education: N/A   Occupational History  . Not on file.   Social History Main Topics  . Smoking status: Never Smoker   . Smokeless tobacco: Not on file  . Alcohol Use: Not on file  . Drug Use: Not on file  . Sexual Activity: Not on file   Other Topics Concern  . Not on file   Social History Narrative    FAMILY HISTORY: Family History  Problem Relation Age of Onset  . Heart attack Neg Hx     REVIEW OF SYSTEMS: Reviewed from chart for this admission.  DENTAL HISTORY: CHIEF COMPLAINT: The patient was referred by Dr. Tharon Aquas Trigt for dental consultation.   HPI: [name] is a 54 year old male recently diagnosed with aortic insufficiency and heart failure. Patient with anticipated aortic valve replacement with Dr. Tharon Aquas Trigt. Patient is now seen as part of a pre-heart valve surgery dental protocol examination.  The patient currently denies acute toothaches, swellings, or abscesses. Patient indicates that has been more than 2 years since he last saw a a dentist. Patient had a dental cleaning at that time.   DENTAL EXAMINATION: GENERAL:Patient is a well-developed, well-nourished male in no acute distress.  HEAD AND NECK:There is no palpable some inhibitor lymphadenopathy. The patient denies acute TMJ symptoms.  INTRAORAL EXAM:Patient has normal saliva. I do not see any evidence of abscess formation.  DENTITION:Patient is missing tooth numbers 18 and 30.  PERIODONTAL:Patient has chronic periodontitis with plaque and calculus Relations, selective areas gingival recession and no obvious tooth mobility.   DENTAL CARIES/SUBOPTIMAL RESTORATIONS:There are no obvious dental caries noted today. Patient would need a full series of dental radiographs to rule out incipient dental caries.  ENDODONTIC:Patient denies acute pulpitis symptoms. I do not see any periapical pathology and dental radiographs.  CROWN AND BRIDGE:There are no crown restorations or bridge restorations noted.  PROSTHODONTIC: The patient denies having a partial denture.  OCCLUSION:The patient has a poor occlusal scheme but a stable occlusion  RADIOGRAPHIC INTERPRETATION: An orthopantogram was taken on 03/30/2015. Patient is missing tooth numbers 18 and 30. There is incipient to moderate bone loss. There are no obvious periapical radiolucencies. There is supra-eruption and drifting of the unopposed teeth into the edentulous areas. Multiple dental restorations are noted.   ASSESSMENTS: 1. Aortic valve disease 2. Preaortic valve  replacement dental protocol examination 3. Chronic periodontitis with bone loss 4. Accretions-minimal 5. Malocclusion  PLAN/RECOMMENDATIONS: 1. I discussed the risks, benefits, and complications of various treatment options with the patient in relationship to his medical and dental conditions. We discussed various treatment options to include no treatment, multiple extractions with alveoloplasty, pre-prosthetic surgery as indicated, periodontal therapy, dental restorations, root canal therapy, crown and bridge therapy, implant therapy, and replacement of missing teeth as indicated. The patient currently  defers any dental treatment at this time. The patient will follow-up with a dentist of his choice for exam, radiographs, and discussion of other treatment needs to include periodontal maintenance procedures at least every 6 months once he is medically stable from his anticipated aortic valve replacement. Patient will need antibiotic premedication prior to invasive dental procedures due to anticipated aortic valve  replacement. The patient is currently cleared for aortic valve replacement with Dr. Prescott Gum on Monday.   2. Discussion of findings with medical team and coordination of future medical and dental care as needed.   Lenn Cal, DDS

## 2015-04-01 NOTE — Progress Notes (Signed)
I stopped back into check on [name].  Noted that he will be having AVR with Maze on Monday.  I will come back to check on patient post surgery.  He tells me that he is working on his disability and back pay to assist with financial concerns going forward.  He seems very positive toward surgery with hopes to feel better.  He has no questions regarding HF at this time.

## 2015-04-01 NOTE — Progress Notes (Signed)
Pt refuses ABG stick at this time.

## 2015-04-01 NOTE — Progress Notes (Signed)
Subjective: No dizziness, CP, SOB, PND  Objective: Vital signs in last 24 hours: Temp:  [98 F (36.7 C)-98.5 F (36.9 C)] 98.5 F (36.9 C) (04/14 0520) Pulse Rate:  [64-90] 64 (04/14 0520) Resp:  [18] 18 (04/14 0520) BP: (127-147)/(41-107) 132/56 mmHg (04/14 0520) SpO2:  [97 %-100 %] 97 % (04/14 0520) Weight:  [225 lb 12.8 oz (102.422 kg)] 225 lb 12.8 oz (102.422 kg) (04/14 0520) Last BM Date: 03/31/15  Intake/Output from previous day: 04/13 0701 - 04/14 0700 In: 800 [P.O.:480; I.V.:320] Out: 1470 [Urine:1470] Total out this adm- 11.6L    Medications Current Facility-Administered Medications  Medication Dose Route Frequency Provider Last Rate Last Dose  . 0.9 %  sodium chloride infusion  250 mL Intravenous PRN Jules Husbands, MD 10 mL/hr at 03/31/15 1830 250 mL at 03/31/15 1830  . acetaminophen (TYLENOL) tablet 650 mg  650 mg Oral Q4H PRN Jules Husbands, MD      . albuterol (PROVENTIL) (2.5 MG/3ML) 0.083% nebulizer solution 3 mL  3 mL Inhalation Q4H PRN Jules Husbands, MD      . atorvastatin (LIPITOR) tablet 40 mg  40 mg Oral Daily Jules Husbands, MD   40 mg at 03/31/15 1005  . furosemide (LASIX) tablet 40 mg  40 mg Oral BID Darlin Coco, MD   40 mg at 03/31/15 1750  . heparin ADULT infusion 100 units/mL (25000 units/250 mL)  1,600 Units/hr Intravenous Continuous Kris Mouton, RPH 16 mL/hr at 04/01/15 0349 1,600 Units/hr at 04/01/15 0349  . lisinopril (PRINIVIL,ZESTRIL) tablet 5 mg  5 mg Oral Daily Liliane Shi, PA-C   5 mg at 03/31/15 1005  . ondansetron (ZOFRAN) injection 4 mg  4 mg Intravenous Q6H PRN Jules Husbands, MD      . sodium chloride 0.9 % injection 3 mL  3 mL Intravenous Q12H Jules Husbands, MD   3 mL at 03/30/15 1056  . sodium chloride 0.9 % injection 3 mL  3 mL Intravenous PRN Jules Husbands, MD        PE: General appearance: alert, cooperative, no distress and Sitting up eating breakfast Lungs: clear to auscultation bilaterally Heart: irregularly irregular rhythm and  3/6 sys/diastolic MM.  Abdomen: soft, non-tender; bowel sounds normal; no masses,  no organomegaly Extremities: Trace LEE Pulses: 2+ and symmetric Skin: Warm and dry Neurologic: Grossly normal  Lab Results:   Recent Labs  03/30/15 0600 03/31/15 0455 04/01/15 0600  WBC 6.5 7.2 6.0  HGB 13.3 14.0 13.7  HCT 40.6 43.6 42.1  PLT 231 253 255   BMET  Recent Labs  03/30/15 0600 03/31/15 0455 04/01/15 0600  NA 138 138 140  K 3.8 3.9 4.2  CL 99 98 103  CO2 32 26 27  GLUCOSE 111* 108* 106*  BUN 18 17 15   CREATININE 1.49* 1.48* 1.26  CALCIUM 8.9 9.0 9.0   Echo 03/29/15.   - Left ventricle: The cavity size was moderately dilated. There was moderate concentric hypertrophy. Systolic function was moderately to severely reduced. The estimated ejection fraction was in the range of 30% to 35%. Diffuse hypokinesis with no identifiable regional variations. The study is not technically sufficient to allow evaluation of LV diastolic function. Doppler parameters are consistent with elevated mean left atrial filling pressure. - Aortic valve: Possibly bicuspid; mildly thickened, mildly calcified leaflets. There was moderate to severe regurgitation directed eccentrically in the LVOT and towards the mitral anterior leaflet. - Left atrium: The atrium was severely dilated. - Right atrium: The  atrium was severely dilated. - Pulmonary arteries: PA peak pressure: 42 mm Hg (S).  Assessment/Plan    Acute on chronic systolic heart failure Lasix changed to 40 mg PO BID yesterday   Bicuspid aortic valve  AS, severe,   AI moderate to severe     Congestive dilated cardiomyopathy, EF 30-35%   Essential hypertension  Controlled for the most part.    CKD (chronic kidney disease), stage II-III   Atrial flutter On iv heparin, TSH T3/4 WNL.  Otherwise, rate controlled.  No beta blocker.     Elevated troponin, 0.11.  - normal coronaries 11/27/14     DM2-new diagnosis.    A1C 6.9.  On no meds.  Morning glucose ~110.  We discussed dietary changes and exercise once valve replaced.  Seen by DM coordinator.    Dysphagia. Barium swallow:  Small sliding-type hiatal hernia and inducible GE reflux but no mass or stricture  Plan: Plan for valve/AO root and MAZE Monday   LOS: 5 days    Dyna Figuereo K PA-C 04/01/2015 8:47 AM

## 2015-04-01 NOTE — Progress Notes (Signed)
Pre-op Cardiac Surgery  Carotid Findings:  Bilateral:  1-39% ICA stenosis.  Vertebral artery flow is antegrade.      Upper Extremity Right Left  Brachial Pressures N/A - IV and bandages 153  Radial Waveforms Tri Tri  Ulnar Waveforms Tri Tri  Palmar Arch (Allen's Test) Decreases >50% with radial compression, normal with ulnar compression  normal    Landry Mellow, RDMS, RVT 04/01/2015

## 2015-04-01 NOTE — Progress Notes (Signed)
*  PRELIMINARY RESULTS* Vascular Ultrasound Lower extremity venous duplex has been completed.  Preliminary findings: negative for DVT.  Landry Mellow, RDMS, RVT  04/01/2015, 10:28 AM

## 2015-04-01 NOTE — Plan of Care (Signed)
Problem: Food- and Nutrition-Related Knowledge Deficit (NB-1.1) Goal: Nutrition education Formal process to instruct or train a patient/client in a skill or to impart knowledge to help patients/clients voluntarily manage or modify food choices and eating behavior to maintain or improve health. Outcome: Adequate for Discharge RD was consulted to provide DM education for new onset diabetes. Dietetic Intern provided handout "Carbohydrate Counting for People With Diabetes" from the AND.  Pt just came back from testing for anticipated valve replacement surgery next week.  Pt states that he is "not claiming" diabetes and appears to be in denial. 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 Per pt, he does not drink any sweetened beverages, stays away from bread and eats pasta/rice on occasion, desserts rarely (ordered a blueberry cobbler and ice-cream for dinner, though, stating that he is treating himself today). He eats 3-4 meals per day with 3-4 snacks, and carb counting is part of his exercise/diet plan and diet recall reveals that his diet is rather low in carbohydrates. He was able to freely state carbohydrates and the serving sizes for many of them, as well as differentiating "good" carbs from "bad".  Teach back method used; pt appears to have good understanding. Expect good compliance.  Elmarie Devlin A. [facility] Dietetic Intern Pager: [phone] 04/01/2015 11:20 AM

## 2015-04-01 NOTE — Progress Notes (Signed)
CT Surgery  CT shows fusiform ascending aneurysm 5cm Plavix washout in progress- P2 Y12 was low yesterday Plan AVR with biologic Valve and replacement of ascending aorta with Maze Mon am Procedure reviewed with patient

## 2015-04-01 NOTE — Progress Notes (Signed)
See Kerin Ransom PA-C's note from earlier today.  Agree with plans to use a bioprosthetic valve in this patient. Surgery planned for Monday. Questions answered.

## 2015-04-01 NOTE — Progress Notes (Signed)
ANTICOAGULATION CONSULT NOTE - Follow Up Consult  Pharmacy Consult for Heparin Indication: Aflutter  No Known Allergies  Patient Measurements: Height: 5\' 11"  (180.3 cm) Weight: 225 lb 12.8 oz (102.422 kg) IBW/kg (Calculated) : 75.3 Heparin Dosing Weight: 98 kg  Vital Signs: Temp: 98.5 F (36.9 C) (04/14 0520) Temp Source: Oral (04/14 0520) BP: 132/56 mmHg (04/14 0520) Pulse Rate: 64 (04/14 0520)  Labs:  Recent Labs  03/30/15 0600 03/31/15 0455 03/31/15 0500 04/01/15 0600  HGB 13.3 14.0  --  13.7  HCT 40.6 43.6  --  42.1  PLT 231 253  --  255  LABPROT  --  15.6*  --   --   INR  --  1.23  --   --   HEPARINUNFRC 0.44  --  0.43 0.70  CREATININE 1.49* 1.48*  --  1.26    Estimated Creatinine Clearance: 81.6 mL/min (by C-G formula based on Cr of 1.26).   Medications:  Heparin @ 1600 units/hr (16 ml/hr)  Assessment: 25 YOM who continues on heparin for Aflutter (CHADSVASC= 3; HF, HTN, DM). Originally also concerned for PE but VQ scan  showed a low probability. Heparin level is at goal (HL= 0.7) and CBC is stable. He is noted with severe aortic stenosis noted for AVR/MAZE on 4/18.  Goal of Therapy:  Heparin level 0.3-0.7 units/ml Monitor platelets by anticoagulation protocol: Yes   Plan:  -Continue heparin at the current rate of 1600 units/hr -Daily heparin level and CBC  Hildred Laser, Pharm D 04/01/2015 10:01 AM

## 2015-04-02 ENCOUNTER — Inpatient Hospital Stay (HOSPITAL_COMMUNITY): Payer: Medicaid Other

## 2015-04-02 ENCOUNTER — Encounter (HOSPITAL_COMMUNITY): Payer: Medicaid Other

## 2015-04-02 LAB — PULMONARY FUNCTION TEST
DL/VA % pred: 94 %
DL/VA: 4.4 ml/min/mmHg/L
DLCO cor % pred: 66 %
DLCO cor: 21.35 ml/min/mmHg
DLCO unc % pred: 65 %
DLCO unc: 21.1 ml/min/mmHg
FEF 25-75 Post: 2.11 L/sec
FEF 25-75 Pre: 1.09 L/sec
FEF2575-%Change-Post: 93 %
FEF2575-%Pred-Post: 66 %
FEF2575-%Pred-Pre: 34 %
FEV1-%Change-Post: 15 %
FEV1-%Pred-Post: 63 %
FEV1-%Pred-Pre: 55 %
FEV1-Post: 2.07 L
FEV1-Pre: 1.8 L
FEV1FVC-%Change-Post: 7 %
FEV1FVC-%Pred-Pre: 87 %
FEV6-%Change-Post: 11 %
FEV6-%Pred-Post: 67 %
FEV6-%Pred-Pre: 60 %
FEV6-Post: 2.68 L
FEV6-Pre: 2.41 L
FEV6FVC-%Change-Post: 4 %
FEV6FVC-%Pred-Post: 100 %
FEV6FVC-%Pred-Pre: 96 %
FVC-%Change-Post: 7 %
FVC-%Pred-Post: 67 %
FVC-%Pred-Pre: 63 %
FVC-Post: 2.79 L
FVC-Pre: 2.6 L
Post FEV1/FVC ratio: 74 %
Post FEV6/FVC ratio: 98 %
Pre FEV1/FVC ratio: 69 %
Pre FEV6/FVC Ratio: 93 %
RV % pred: 113 %
RV: 2.4 L
TLC % pred: 75 %
TLC: 5.25 L

## 2015-04-02 LAB — CBC
HEMATOCRIT: 43.2 % (ref 39.0–52.0)
Hemoglobin: 14.2 g/dL (ref 13.0–17.0)
MCH: 27.1 pg (ref 26.0–34.0)
MCHC: 32.9 g/dL (ref 30.0–36.0)
MCV: 82.4 fL (ref 78.0–100.0)
Platelets: 273 10*3/uL (ref 150–400)
RBC: 5.24 MIL/uL (ref 4.22–5.81)
RDW: 15.3 % (ref 11.5–15.5)
WBC: 6.9 10*3/uL (ref 4.0–10.5)

## 2015-04-02 LAB — HEPARIN LEVEL (UNFRACTIONATED): Heparin Unfractionated: 0.47 IU/mL (ref 0.30–0.70)

## 2015-04-02 MED ORDER — CYCLOBENZAPRINE HCL 10 MG PO TABS
10.0000 mg | ORAL_TABLET | Freq: Once | ORAL | Status: AC
  Administered 2015-04-02: 10 mg via ORAL
  Filled 2015-04-02: qty 1

## 2015-04-02 MED ORDER — ALBUTEROL SULFATE (2.5 MG/3ML) 0.083% IN NEBU
2.5000 mg | INHALATION_SOLUTION | Freq: Once | RESPIRATORY_TRACT | Status: AC
  Administered 2015-04-02: 2.5 mg via RESPIRATORY_TRACT

## 2015-04-02 NOTE — Progress Notes (Signed)
CARDIAC REHAB PHASE I   PRE:  Rate/Rhythm: 69 a. fib  BP:  Sitting: 118/63        SaO2: 99 RA  MODE:  Ambulation: 950 ft   POST:  Rate/Rhythm: 96 a.fib  BP:  Sitting: 190/75, 176/53 recheck same arm         SaO2: 96 RA  Pt ambulated 950 ft on RA, steady gait, tolerated well, denies SOB, dizziness, chest pain.  Pt does c/o mild pain in his legs d/t "swelling." Pt encouraged to ambulate as tolerated. Pt asks a lot of detailed questions regarding surgery, seems anxious although he denies any anxiety.  Pt does say he is "under stress" concerning "back payments for disability."  Reviewed pre-op education, reinforced IS, sternal precautions and mobility. Pt verbalized understanding. Encouraged pt to refer to cardiac surgery booklet for information as well.    [id number]   Lenna Sciara, RN, BSN 04/02/2015 3:08 PM

## 2015-04-02 NOTE — Progress Notes (Signed)
Subjective: Patient feels well today.  No chest pain or dyspnea.  Rhythm is atrial flutter. Controlled VR.  Objective: Vital signs in last 24 hours: Temp:  [98.5 F (36.9 C)-99.1 F (37.3 C)] 98.6 F (37 C) (04/15 0421) Pulse Rate:  [67-71] 68 (04/15 0421) Resp:  [20] 20 (04/14 1517) BP: (138-189)/(58-85) 143/58 mmHg (04/15 0421) SpO2:  [97 %-100 %] 98 % (04/15 0421) Weight:  [227 lb 1.6 oz (103.012 kg)] 227 lb 1.6 oz (103.012 kg) (04/15 0421) Last BM Date: 03/31/15  Intake/Output from previous day: 04/14 0701 - 04/15 0700 In: -  Out: 825 [Urine:825] Intake/Output this shift:    Medications Current Facility-Administered Medications  Medication Dose Route Frequency Provider Last Rate Last Dose  . 0.9 %  sodium chloride infusion  250 mL Intravenous PRN Jules Husbands, MD 10 mL/hr at 03/31/15 1830 250 mL at 03/31/15 1830  . acetaminophen (TYLENOL) tablet 650 mg  650 mg Oral Q4H PRN Jules Husbands, MD   650 mg at 04/02/15 0433  . albuterol (PROVENTIL) (2.5 MG/3ML) 0.083% nebulizer solution 3 mL  3 mL Inhalation Q4H PRN Jules Husbands, MD      . atorvastatin (LIPITOR) tablet 40 mg  40 mg Oral Daily Jules Husbands, MD   40 mg at 04/01/15 1113  . furosemide (LASIX) tablet 40 mg  40 mg Oral BID Darlin Coco, MD   40 mg at 04/01/15 1853  . heparin ADULT infusion 100 units/mL (25000 units/250 mL)  1,600 Units/hr Intravenous Continuous Kris Mouton, RPH 16 mL/hr at 04/01/15 0349 1,600 Units/hr at 04/01/15 0349  . lisinopril (PRINIVIL,ZESTRIL) tablet 5 mg  5 mg Oral Daily Liliane Shi, PA-C   5 mg at 04/01/15 1113  . ondansetron (ZOFRAN) injection 4 mg  4 mg Intravenous Q6H PRN Jules Husbands, MD      . sodium chloride 0.9 % injection 3 mL  3 mL Intravenous Q12H Jules Husbands, MD   3 mL at 03/30/15 1056  . sodium chloride 0.9 % injection 3 mL  3 mL Intravenous PRN Jules Husbands, MD        PE: General appearance: alert, cooperative, no distress and Sitting up eating breakfast Lungs: clear to  auscultation bilaterally Heart: slightly irregular rhythm and 3/6 sys/diastolic MM.  Abdomen: soft, non-tender; bowel sounds normal; no masses, no organomegaly Extremities: Trace LEE Pulses: 2+ and symmetric Skin: Warm and dry Neurologic: Grossly normal  Lab Results:   Recent Labs  03/31/15 0455 04/01/15 0600 04/02/15 0429  WBC 7.2 6.0 6.9  HGB 14.0 13.7 14.2  HCT 43.6 42.1 43.2  PLT 253 255 273   BMET  Recent Labs  03/31/15 0455 04/01/15 0600  NA 138 140  K 3.9 4.2  CL 98 103  CO2 26 27  GLUCOSE 108* 106*  BUN 17 15  CREATININE 1.48* 1.26  CALCIUM 9.0 9.0   PT/INR  Recent Labs  03/31/15 0455  LABPROT 15.6*  INR 1.23    Assessment/Plan  Principal Problem:   Acute on chronic systolic heart failure Active Problems:   Aortic insufficiency   Congestive dilated cardiomyopathy   Essential hypertension   CKD (chronic kidney disease), stage II-III   Atrial flutter   Elevated troponin   Aortic stenosis   Bicuspid aortic valve  Acute on chronic systolic heart failure Now on oral lasix     Bicuspid aortic valve AS, severe, AI moderate to severe   Congestive dilated cardiomyopathy, EF 30-35%  Essential hypertension Controlled for the most  part.   CKD (chronic kidney disease), stage II-III  Atrial flutter On iv heparin, TSH T3/4 WNL. Otherwise, rate controlled. No beta blocker.   Elevated troponin, 0.11. - normal coronaries 11/27/14   DM2-new diagnosis.  A1C 6.9. On no meds. Morning glucose ~110. We discussed dietary changes and exercise once valve replaced. Seen by DM coordinator.    Dysphagia. Barium swallow:  Small sliding-type hiatal hernia and inducible GE reflux but no mass or stricture  Plan: Plan for valve/AO root and MAZE Monday   LOS: 6 days    HAGER, BRYAN PA-C 04/02/2015 8:38 AM Agree with above assessment. Patient looking forward to getting his valve fixed. No new problems.

## 2015-04-02 NOTE — Progress Notes (Signed)
ANTICOAGULATION CONSULT NOTE - Follow Up Consult  Pharmacy Consult for Heparin Indication: Aflutter  No Known Allergies  Patient Measurements: Height: 5\' 11"  (180.3 cm) Weight: 227 lb 1.6 oz (103.012 kg) IBW/kg (Calculated) : 75.3 Heparin Dosing Weight: 98 kg  Vital Signs: Temp: 98.6 F (37 C) (04/15 0421) Temp Source: Oral (04/15 0421) BP: 143/58 mmHg (04/15 0421) Pulse Rate: 68 (04/15 0421)  Labs:  Recent Labs  03/31/15 0455 03/31/15 0500 04/01/15 0600 04/02/15 0429  HGB 14.0  --  13.7 14.2  HCT 43.6  --  42.1 43.2  PLT 253  --  255 273  LABPROT 15.6*  --   --   --   INR 1.23  --   --   --   HEPARINUNFRC  --  0.43 0.70 0.47  CREATININE 1.48*  --  1.26  --     Estimated Creatinine Clearance: 81.9 mL/min (by C-G formula based on Cr of 1.26).   Medications:  Heparin @ 1600 units/hr (16 ml/hr)  Assessment: 3 YOM who continues on heparin for Aflutter (CHADSVASC= 3; HF, HTN, DM). Originally also concerned for PE but VQ scan  showed a low probability. Heparin level is at goal (HL= 0.47) and CBC is stable. He is noted with severe aortic stenosis noted for AVR/MAZE on 4/18.  Goal of Therapy:  Heparin level 0.3-0.7 units/ml Monitor platelets by anticoagulation protocol: Yes   Plan:  -Continue heparin at the current rate of 1600 units/hr -Daily heparin level and CBC  Hildred Laser, Pharm D 04/02/2015 12:38 PM

## 2015-04-03 DIAGNOSIS — I483 Typical atrial flutter: Secondary | ICD-10-CM

## 2015-04-03 DIAGNOSIS — I10 Essential (primary) hypertension: Secondary | ICD-10-CM

## 2015-04-03 DIAGNOSIS — I350 Nonrheumatic aortic (valve) stenosis: Secondary | ICD-10-CM

## 2015-04-03 LAB — CBC
HCT: 44.1 % (ref 39.0–52.0)
Hemoglobin: 14.6 g/dL (ref 13.0–17.0)
MCH: 27 pg (ref 26.0–34.0)
MCHC: 33.1 g/dL (ref 30.0–36.0)
MCV: 81.7 fL (ref 78.0–100.0)
Platelets: 246 10*3/uL (ref 150–400)
RBC: 5.4 MIL/uL (ref 4.22–5.81)
RDW: 15.2 % (ref 11.5–15.5)
WBC: 8 10*3/uL (ref 4.0–10.5)

## 2015-04-03 LAB — HEPARIN LEVEL (UNFRACTIONATED): Heparin Unfractionated: 0.41 IU/mL (ref 0.30–0.70)

## 2015-04-03 MED ORDER — DICLOFENAC SODIUM 1 % TD GEL
2.0000 g | Freq: Four times a day (QID) | TRANSDERMAL | Status: DC
  Administered 2015-04-03 – 2015-04-04 (×5): 2 g via TOPICAL
  Filled 2015-04-03 (×2): qty 100

## 2015-04-03 MED ORDER — TRAMADOL HCL 50 MG PO TABS
50.0000 mg | ORAL_TABLET | Freq: Four times a day (QID) | ORAL | Status: DC | PRN
  Administered 2015-04-03 – 2015-04-04 (×3): 50 mg via ORAL
  Filled 2015-04-03 (×3): qty 1

## 2015-04-03 NOTE — Progress Notes (Signed)
Subjective: Patient feels well today.  No chest pain or dyspnea.  Rhythm is atrial flutter. Controlled VR. Complains of pain and stiffness in right triceps area near elbow.   Objective: Vital signs in last 24 hours: Temp:  [99.1 F (37.3 C)-99.5 F (37.5 C)] 99.1 F (37.3 C) (04/16 7341) Pulse Rate:  [54-69] 69 (04/16 0638) Resp:  [18] 18 (04/16 9379) BP: (118-146)/(52-70) 126/52 mmHg (04/16 0638) SpO2:  [97 %-100 %] 100 % (04/16 0240) Weight:  [222 lb (100.699 kg)] 222 lb (100.699 kg) (04/16 9735) Last BM Date: 04/02/15  Intake/Output from previous day: 04/15 0701 - 04/16 0700 In: -  Out: 2520 [Urine:2520] Intake/Output this shift:    Medications Current Facility-Administered Medications  Medication Dose Route Frequency Provider Last Rate Last Dose  . 0.9 %  sodium chloride infusion  250 mL Intravenous PRN Jules Husbands, MD 10 mL/hr at 03/31/15 1830 250 mL at 03/31/15 1830  . acetaminophen (TYLENOL) tablet 650 mg  650 mg Oral Q4H PRN Jules Husbands, MD   650 mg at 04/02/15 0433  . albuterol (PROVENTIL) (2.5 MG/3ML) 0.083% nebulizer solution 3 mL  3 mL Inhalation Q4H PRN Jules Husbands, MD      . atorvastatin (LIPITOR) tablet 40 mg  40 mg Oral Daily Jules Husbands, MD   40 mg at 04/02/15 1025  . diclofenac sodium (VOLTAREN) 1 % transdermal gel 2 g  2 g Topical QID Peter M Martinique, MD      . furosemide (LASIX) tablet 40 mg  40 mg Oral BID Darlin Coco, MD   40 mg at 04/03/15 0824  . heparin ADULT infusion 100 units/mL (25000 units/250 mL)  1,600 Units/hr Intravenous Continuous Kris Mouton, RPH 16 mL/hr at 04/02/15 1131 1,600 Units/hr at 04/02/15 1131  . lisinopril (PRINIVIL,ZESTRIL) tablet 5 mg  5 mg Oral Daily Liliane Shi, PA-C   5 mg at 04/02/15 1025  . ondansetron (ZOFRAN) injection 4 mg  4 mg Intravenous Q6H PRN Jules Husbands, MD      . sodium chloride 0.9 % injection 3 mL  3 mL Intravenous Q12H Jules Husbands, MD   3 mL at 03/30/15 1056  . sodium chloride 0.9 % injection 3 mL   3 mL Intravenous PRN Jules Husbands, MD      . traMADol Veatrice Bourbon) tablet 50 mg  50 mg Oral Q6H PRN Peter M Martinique, MD        PE: General appearance: alert, cooperative, no distress and Sitting up eating breakfast Lungs: clear to auscultation bilaterally Heart: slightly irregular rhythm and 3/6 sys/diastolic MM.  Abdomen: soft, non-tender; bowel sounds normal; no masses, no organomegaly Extremities: Trace LEE, pain to palpation right triceps insertion at elbow. Pulses: 2+ and symmetric Skin: Warm and dry Neurologic: Grossly normal  Lab Results:   Recent Labs  04/01/15 0600 04/02/15 0429  WBC 6.0 6.9  HGB 13.7 14.2  HCT 42.1 43.2  PLT 255 273   BMET  Recent Labs  04/01/15 0600  NA 140  K 4.2  CL 103  CO2 27  GLUCOSE 106*  BUN 15  CREATININE 1.26  CALCIUM 9.0   PT/INR No results for input(s): LABPROT, INR in the last 72 hours.  Assessment/Plan  Principal Problem:   Acute on chronic systolic heart failure Active Problems:   Aortic insufficiency   Congestive dilated cardiomyopathy   Essential hypertension   CKD (chronic kidney disease), stage II-III   Atrial flutter   Elevated troponin   Aortic stenosis  Bicuspid aortic valve  Acute on chronic systolic heart failure Now on oral lasix- appears compensated.     Bicuspid aortic valve AS, severe, AI moderate to severe   Congestive dilated cardiomyopathy, EF 30-35%   Essential hypertension Controlled for the most part.    CKD (chronic kidney disease), stage II-III   Atrial flutter On iv heparin, TSH T3/4 WNL. Otherwise, rate controlled. No beta blocker.    Elevated troponin, 0.11. - normal coronaries 11/27/14   DM2-new diagnosis.  A1C 6.9. On no meds. Morning glucose ~110. We discussed dietary changes and exercise once valve replaced. Seen by DM coordinator.    Dysphagia. Barium swallow:  Small sliding-type hiatal hernia and inducible GE reflux but no  mass or stricture    Right triceps pain. Will give flexeril and tramadol prn. Voltaren gel to affected site.   Plan: Plan for valve/AO root and MAZE Monday   LOS: 7 days    Peter Martinique MD,FACC 04/03/2015 8:45 AM

## 2015-04-03 NOTE — Progress Notes (Signed)
ANTICOAGULATION CONSULT NOTE - Follow Up Consult  Pharmacy Consult for Heparin Indication: Aflutter  No Known Allergies  Patient Measurements: Height: 5\' 11"  (180.3 cm) Weight: 222 lb (100.699 kg) IBW/kg (Calculated) : 75.3 Heparin Dosing Weight: 98 kg  Vital Signs: Temp: 99.1 F (37.3 C) (04/16 0638) Temp Source: Oral (04/16 6761) BP: 126/52 mmHg (04/16 9509) Pulse Rate: 69 (04/16 0638)  Labs:  Recent Labs  04/01/15 0600 04/02/15 0429 04/03/15 0510 04/03/15 0840  HGB 13.7 14.2  --  14.6  HCT 42.1 43.2  --  44.1  PLT 255 273  --  246  HEPARINUNFRC 0.70 0.47 0.41  --   CREATININE 1.26  --   --   --     Estimated Creatinine Clearance: 81.1 mL/min (by C-G formula based on Cr of 1.26).   Medications:  Heparin @ 1600 units/hr (16 ml/hr)  Assessment: 47 YOM admitted on 03/27/2015 who continues on heparin for Aflutter (CHADSVASC= 3; HF, HTN, DM). Heparin level remains at goal (HL= 0.41) and CBC is stable. He is noted with severe aortic stenosis noted for AVR/MAZE on 4/18.  Goal of Therapy:  Heparin level 0.3-0.7 units/ml Monitor platelets by anticoagulation protocol: Yes   Plan:  -Continue heparin at the current rate of 1600 units/hr -Daily heparin level and CBC  Jyoti Harju K. Velva Harman, PharmD, [facility] Clinical Pharmacist - Resident Pager: [phone] Pharmacy: [phone] 04/03/2015 10:26 AM

## 2015-04-04 ENCOUNTER — Inpatient Hospital Stay (HOSPITAL_COMMUNITY): Payer: Medicaid Other

## 2015-04-04 LAB — BLOOD GAS, ARTERIAL
Acid-Base Excess: 1.8 mmol/L (ref 0.0–2.0)
BICARBONATE: 25.8 meq/L — AB (ref 20.0–24.0)
Drawn by: 12971
FIO2: 0.21 %
O2 Saturation: 92.9 %
PATIENT TEMPERATURE: 98.6
PH ART: 7.427 (ref 7.350–7.450)
PO2 ART: 70.1 mmHg — AB (ref 80.0–100.0)
TCO2: 27 mmol/L (ref 0–100)
pCO2 arterial: 39.8 mmHg (ref 35.0–45.0)

## 2015-04-04 LAB — BASIC METABOLIC PANEL
Anion gap: 12 (ref 5–15)
BUN: 19 mg/dL (ref 6–23)
CO2: 27 mmol/L (ref 19–32)
CREATININE: 1.44 mg/dL — AB (ref 0.50–1.35)
Calcium: 9.3 mg/dL (ref 8.4–10.5)
Chloride: 97 mmol/L (ref 96–112)
GFR calc Af Amer: 62 mL/min — ABNORMAL LOW (ref >90)
GFR calc non Af Amer: 54 mL/min — ABNORMAL LOW (ref >90)
GLUCOSE: 124 mg/dL — AB (ref 70–99)
Potassium: 4.2 mmol/L (ref 3.5–5.1)
Sodium: 136 mmol/L (ref 135–145)

## 2015-04-04 LAB — CBC
HCT: 42.8 % (ref 39.0–52.0)
Hemoglobin: 13.9 g/dL (ref 13.0–17.0)
MCH: 27 pg (ref 26.0–34.0)
MCHC: 32.5 g/dL (ref 30.0–36.0)
MCV: 83.3 fL (ref 78.0–100.0)
Platelets: 238 10*3/uL (ref 150–400)
RBC: 5.14 MIL/uL (ref 4.22–5.81)
RDW: 15.3 % (ref 11.5–15.5)
WBC: 7 10*3/uL (ref 4.0–10.5)

## 2015-04-04 LAB — ABO/RH: ABO/RH(D): AB POS

## 2015-04-04 LAB — HEPARIN LEVEL (UNFRACTIONATED): HEPARIN UNFRACTIONATED: 0.5 [IU]/mL (ref 0.30–0.70)

## 2015-04-04 MED ORDER — TEMAZEPAM 15 MG PO CAPS: 15.0000 mg | ORAL_CAPSULE | Freq: Once | ORAL | Status: AC | PRN

## 2015-04-04 MED ORDER — POTASSIUM CHLORIDE 2 MEQ/ML IV SOLN
80.0000 meq | INTRAVENOUS | Status: DC
  Filled 2015-04-04: qty 40

## 2015-04-04 MED ORDER — SODIUM CHLORIDE 0.9 % IV SOLN
INTRAVENOUS | Status: DC
  Filled 2015-04-04: qty 30

## 2015-04-04 MED ORDER — VANCOMYCIN HCL 10 G IV SOLR
1500.0000 mg | INTRAVENOUS | Status: AC
  Administered 2015-04-05: 1500 mg via INTRAVENOUS
  Filled 2015-04-04: qty 1500

## 2015-04-04 MED ORDER — DIAZEPAM 5 MG PO TABS
5.0000 mg | ORAL_TABLET | Freq: Once | ORAL | Status: AC
  Administered 2015-04-05: 5 mg via ORAL
  Filled 2015-04-04: qty 1

## 2015-04-04 MED ORDER — DEXMEDETOMIDINE HCL IN NACL 400 MCG/100ML IV SOLN
0.1000 ug/kg/h | INTRAVENOUS | Status: AC
  Administered 2015-04-05 (×2): via INTRAVENOUS
  Administered 2015-04-05: .4 ug/kg/h via INTRAVENOUS
  Filled 2015-04-04: qty 100

## 2015-04-04 MED ORDER — SODIUM CHLORIDE 0.9 % IV SOLN
INTRAVENOUS | Status: AC
  Administered 2015-04-05: 1 [IU]/h via INTRAVENOUS
  Filled 2015-04-04: qty 2.5

## 2015-04-04 MED ORDER — AMINOCAPROIC ACID 250 MG/ML IV SOLN
INTRAVENOUS | Status: AC
  Administered 2015-04-05: 13:00:00 via INTRAVENOUS
  Administered 2015-04-05: 69.8 mL/h via INTRAVENOUS
  Filled 2015-04-04: qty 40

## 2015-04-04 MED ORDER — CEFUROXIME SODIUM 750 MG IJ SOLR
750.0000 mg | INTRAMUSCULAR | Status: DC
Start: 2015-04-05 — End: 2015-04-05
  Filled 2015-04-04: qty 750

## 2015-04-04 MED ORDER — DOPAMINE-DEXTROSE 3.2-5 MG/ML-% IV SOLN
0.0000 ug/kg/min | INTRAVENOUS | Status: AC
  Administered 2015-04-05: 3 ug/kg/min via INTRAVENOUS
  Filled 2015-04-04: qty 250

## 2015-04-04 MED ORDER — CHLORHEXIDINE GLUCONATE CLOTH 2 % EX PADS
6.0000 | MEDICATED_PAD | Freq: Once | CUTANEOUS | Status: AC
  Administered 2015-04-04: 6 via TOPICAL

## 2015-04-04 MED ORDER — METOPROLOL TARTRATE 12.5 MG HALF TABLET
12.5000 mg | ORAL_TABLET | Freq: Once | ORAL | Status: AC
  Administered 2015-04-05: 12.5 mg via ORAL
  Filled 2015-04-04: qty 1

## 2015-04-04 MED ORDER — BISACODYL 5 MG PO TBEC
5.0000 mg | DELAYED_RELEASE_TABLET | Freq: Once | ORAL | Status: AC
  Administered 2015-04-04: 5 mg via ORAL
  Filled 2015-04-04: qty 1

## 2015-04-04 MED ORDER — EPINEPHRINE HCL 1 MG/ML IJ SOLN
0.0000 ug/min | INTRAVENOUS | Status: DC
  Filled 2015-04-04: qty 4

## 2015-04-04 MED ORDER — CHLORHEXIDINE GLUCONATE CLOTH 2 % EX PADS: 6.0000 | MEDICATED_PAD | Freq: Once | CUTANEOUS | Status: DC

## 2015-04-04 MED ORDER — PLASMA-LYTE 148 IV SOLN
INTRAVENOUS | Status: DC
  Filled 2015-04-04: qty 2.5

## 2015-04-04 MED ORDER — ALPRAZOLAM 0.25 MG PO TABS
0.2500 mg | ORAL_TABLET | ORAL | Status: DC | PRN
  Administered 2015-04-04: 0.5 mg via ORAL
  Filled 2015-04-04: qty 2

## 2015-04-04 MED ORDER — NITROGLYCERIN IN D5W 200-5 MCG/ML-% IV SOLN
2.0000 ug/min | INTRAVENOUS | Status: AC
  Administered 2015-04-05: 5 ug/min via INTRAVENOUS
  Filled 2015-04-04: qty 250

## 2015-04-04 MED ORDER — DEXTROSE 5 % IV SOLN
1.5000 g | INTRAVENOUS | Status: AC
  Administered 2015-04-05: 1.5 g via INTRAVENOUS
  Administered 2015-04-05: .75 g via INTRAVENOUS
  Filled 2015-04-04: qty 1.5

## 2015-04-04 MED ORDER — MAGNESIUM SULFATE 50 % IJ SOLN
40.0000 meq | INTRAMUSCULAR | Status: DC
  Filled 2015-04-04: qty 10

## 2015-04-04 MED ORDER — PHENYLEPHRINE HCL 10 MG/ML IJ SOLN
30.0000 ug/min | INTRAVENOUS | Status: AC
  Administered 2015-04-05: 10 ug/min via INTRAVENOUS
  Filled 2015-04-04: qty 2

## 2015-04-04 NOTE — Progress Notes (Signed)
ANTICOAGULATION CONSULT NOTE - Follow Up Consult  Pharmacy Consult for Heparin Indication: Aflutter  No Known Allergies  Patient Measurements: Height: 5\' 11"  (180.3 cm) Weight: 224 lb (101.606 kg) IBW/kg (Calculated) : 75.3 Heparin Dosing Weight: 98 kg  Vital Signs: Temp: 98.5 F (36.9 C) (04/17 0650) Temp Source: Oral (04/17 0650) BP: 123/44 mmHg (04/17 0650) Pulse Rate: 60 (04/17 0650)  Labs:  Recent Labs  04/02/15 0429 04/03/15 0510 04/03/15 0840 04/04/15 0505  HGB 14.2  --  14.6 13.9  HCT 43.2  --  44.1 42.8  PLT 273  --  246 238  HEPARINUNFRC 0.47 0.41  --  0.50    Estimated Creatinine Clearance: 81.3 mL/min (by C-G formula based on Cr of 1.26).   Medications:  Heparin @ 1600 units/hr (16 ml/hr)  Assessment: 72 YOM admitted on 03/27/2015 who continues on heparin for Aflutter (CHADSVASC= 3; HF, HTN, DM). Heparin level remains at goal (HL= 0.5) and CBC is stable. He is noted with severe aortic stenosis noted for AVR/MAZE on 4/18.  Goal of Therapy:  Heparin level 0.3-0.7 units/ml Monitor platelets by anticoagulation protocol: Yes   Plan:  -Continue heparin at the current rate of 1600 units/hr -Daily heparin level and CBC  Jorryn Hershberger K. Velva Harman, PharmD, [facility] Clinical Pharmacist - Resident Pager: [phone] Pharmacy: [phone] 04/04/2015 10:08 AM

## 2015-04-04 NOTE — Progress Notes (Signed)
Subjective: No complaints, feels good about surgery.  Would like to go outside for brief time today  Objective: Vital signs in last 24 hours: Temp:  [97.7 F (36.5 C)-98.6 F (37 C)] 98.5 F (36.9 C) (04/17 0650) Pulse Rate:  [60-104] 60 (04/17 0650) Resp:  [18-20] 20 (04/17 0650) BP: (123-133)/(40-67) 123/44 mmHg (04/17 0650) SpO2:  [92 %-100 %] 100 % (04/17 0650) Weight:  [224 lb (101.606 kg)] 224 lb (101.606 kg) (04/17 0650) Weight change: 2 lb (0.907 kg) Last BM Date: 04/03/15 Intake/Output from previous day: -1193 04/16 0701 - 04/17 0700 In: 432 [P.O.:240; I.V.:192] Out: 1625 [Urine:1625] Intake/Output this shift:    PE: General:Pleasant affect, NAD Skin:Warm and dry, brisk capillary refill HEENT:normocephalic, sclera clear, mucus membranes moist Neck:supple, no JVD, no bruits  Heart:irreg irreg with 3/6 harsh aortic murmur, no gallup, rub or click Lungs:clear without rales, rhonchi, or wheezes SLH:TDSK, non tender, + BS, do not palpate liver spleen or masses Ext:no lower ext edema, 2+ pedal pulses, 2+ radial pulses Neuro:alert and oriented, MAE, follows commands, + facial symmetry Tele:  A flutter rate control   Lab Results:  Recent Labs  04/03/15 0840 04/04/15 0505  WBC 8.0 7.0  HGB 14.6 13.9  HCT 44.1 42.8  PLT 246 238   BMET No results for input(s): NA, K, CL, CO2, GLUCOSE, BUN, CREATININE, CALCIUM in the last 72 hours.  Invalid input(s): MAGNESIUM No results for input(s): TROPONINI in the last 72 hours.  Invalid input(s): CK, MB  No results found for: CHOL, HDL, LDLCALC, LDLDIRECT, TRIG, CHOLHDL Lab Results  Component Value Date   HGBA1C 6.9* 03/29/2015     Lab Results  Component Value Date   TSH 3.356 03/29/2015      Studies/Results: No results found.  Medications: I have reviewed the patient's current medications. Scheduled Meds: . atorvastatin  40 mg Oral Daily  . diclofenac sodium  2 g Topical QID  . furosemide  40  mg Oral BID  . lisinopril  5 mg Oral Daily  . sodium chloride  3 mL Intravenous Q12H   Continuous Infusions: . heparin 1,600 Units/hr (04/03/15 1900)   PRN Meds:.sodium chloride, acetaminophen, albuterol, ALPRAZolam, ondansetron (ZOFRAN) IV, sodium chloride, traMADol  Assessment/Plan: Principal Problem:   Acute on chronic systolic heart failure  -87.681 since admit with -1193 last pm  Wt 242 lbs on admit now 224 down 18 lbs.  On lasix 40 po BID  Active Problems:   Aortic insufficiency- waiting for surgery in Am  for valve/AO root and MAZE Monday   Congestive dilated cardiomyopathy EF 30-35%   Essential hypertension stable    CKD (chronic kidney disease), stage II-III, last cr on the 14th 1.26, will check in Am   Atrial flutter rate controlled   Elevated troponin- with normal coronaries 11/2014    Aortic stenosis see above.   Bicuspid aortic valve  DM2-new diagnosis.  A1C 6.9. On no meds. Morning glucose ~110. We discussed dietary changes and exercise once valve replaced. Seen by DM coordinator.    Dysphagia. Barium swallow:  Small sliding-type hiatal hernia and inducible GE reflux but no mass or stricture   Right triceps pain. Will give flexeril and tramadol prn. Voltaren gel to affected site. It feels better today able to bend now.   Also concern post discharge placement for recovery.  Case manager consult has been sent.    LOS: 8 days   Time spent with pt. :15 minutes. LXBWIO,MBTDH  R  Nurse Practitioner Certified Pager [phone] or after 5pm and on weekends call [phone] 04/04/2015, 8:41 AM   Patient seen and examined and history reviewed. Agree with above findings and plan. Feeling very well today. Rhythm atrial flutter with controlled rate. Ready for surgery in am.  [name], [place] 04/04/2015 10:43 AM

## 2015-04-05 ENCOUNTER — Inpatient Hospital Stay (HOSPITAL_COMMUNITY): Payer: Medicaid Other

## 2015-04-05 ENCOUNTER — Inpatient Hospital Stay (HOSPITAL_COMMUNITY): Payer: Medicaid Other | Admitting: Certified Registered Nurse Anesthetist

## 2015-04-05 ENCOUNTER — Encounter (HOSPITAL_COMMUNITY)
Admission: EM | Disposition: A | Discharge status: Skilled Nursing Facility | Payer: Medicaid Other | Source: Home / Self Care | Attending: Cardiothoracic Surgery

## 2015-04-05 ENCOUNTER — Encounter (HOSPITAL_COMMUNITY): Payer: Self-pay | Admitting: Certified Registered Nurse Anesthetist

## 2015-04-05 DIAGNOSIS — Z952 Presence of prosthetic heart valve: Secondary | ICD-10-CM

## 2015-04-05 DIAGNOSIS — I361 Nonrheumatic tricuspid (valve) insufficiency: Secondary | ICD-10-CM

## 2015-04-05 DIAGNOSIS — I483 Typical atrial flutter: Secondary | ICD-10-CM

## 2015-04-05 DIAGNOSIS — I711 Thoracic aortic aneurysm, ruptured: Secondary | ICD-10-CM

## 2015-04-05 HISTORY — PX: MAZE: SHX5063

## 2015-04-05 HISTORY — PX: AORTIC VALVE REPLACEMENT: SHX41

## 2015-04-05 HISTORY — PX: TEE WITHOUT CARDIOVERSION: SHX5443

## 2015-04-05 HISTORY — PX: REPLACEMENT ASCENDING AORTA: SHX6068

## 2015-04-05 LAB — HEMOGLOBIN AND HEMATOCRIT, BLOOD
HCT: 27.8 % — ABNORMAL LOW (ref 39.0–52.0)
Hemoglobin: 9.3 g/dL — ABNORMAL LOW (ref 13.0–17.0)

## 2015-04-05 LAB — CBC
HCT: 35.1 % — ABNORMAL LOW (ref 39.0–52.0)
HEMATOCRIT: 36.8 % — AB (ref 39.0–52.0)
HEMOGLOBIN: 12.2 g/dL — AB (ref 13.0–17.0)
Hemoglobin: 11.7 g/dL — ABNORMAL LOW (ref 13.0–17.0)
MCH: 27.2 pg (ref 26.0–34.0)
MCH: 26.9 pg (ref 26.0–34.0)
MCHC: 33.2 g/dL (ref 30.0–36.0)
MCHC: 33.3 g/dL (ref 30.0–36.0)
MCV: 82.1 fL (ref 78.0–100.0)
MCV: 80.7 fL (ref 78.0–100.0)
Platelets: 179 10*3/uL (ref 150–400)
Platelets: 168 10*3/uL (ref 150–400)
RBC: 4.48 MIL/uL (ref 4.22–5.81)
RBC: 4.35 MIL/uL (ref 4.22–5.81)
RDW: 15.1 % (ref 11.5–15.5)
RDW: 14.8 % (ref 11.5–15.5)
WBC: 12.7 10*3/uL — ABNORMAL HIGH (ref 4.0–10.5)
WBC: 13.4 10*3/uL — ABNORMAL HIGH (ref 4.0–10.5)

## 2015-04-05 LAB — POCT I-STAT, CHEM 8
BUN: 13 mg/dL (ref 6–23)
BUN: 18 mg/dL (ref 6–23)
BUN: 18 mg/dL (ref 6–23)
BUN: 18 mg/dL (ref 6–23)
BUN: 18 mg/dL (ref 6–23)
BUN: 19 mg/dL (ref 6–23)
BUN: 19 mg/dL (ref 6–23)
BUN: 19 mg/dL (ref 6–23)
BUN: 20 mg/dL (ref 6–23)
CALCIUM ION: 1.03 mmol/L — AB (ref 1.12–1.23)
CALCIUM ION: 1.08 mmol/L — AB (ref 1.12–1.23)
CALCIUM ION: 1.08 mmol/L — AB (ref 1.12–1.23)
CALCIUM ION: 1.32 mmol/L — AB (ref 1.12–1.23)
CHLORIDE: 98 mmol/L (ref 96–112)
CHLORIDE: 109 mmol/L (ref 96–112)
CHLORIDE: 99 mmol/L (ref 96–112)
CREATININE: 0.8 mg/dL (ref 0.50–1.35)
CREATININE: 1.1 mg/dL (ref 0.50–1.35)
CREATININE: 1.2 mg/dL (ref 0.50–1.35)
CREATININE: 1.2 mg/dL (ref 0.50–1.35)
CREATININE: 1.2 mg/dL (ref 0.50–1.35)
CREATININE: 1.2 mg/dL (ref 0.50–1.35)
Calcium, Ion: 1.26 mmol/L — ABNORMAL HIGH (ref 1.12–1.23)
Calcium, Ion: 1.22 mmol/L (ref 1.12–1.23)
Calcium, Ion: 1.09 mmol/L — ABNORMAL LOW (ref 1.12–1.23)
Calcium, Ion: 0.87 mmol/L — ABNORMAL LOW (ref 1.12–1.23)
Calcium, Ion: 1.01 mmol/L — ABNORMAL LOW (ref 1.12–1.23)
Chloride: 101 mmol/L (ref 96–112)
Chloride: 102 mmol/L (ref 96–112)
Chloride: 97 mmol/L (ref 96–112)
Chloride: 98 mmol/L (ref 96–112)
Chloride: 99 mmol/L (ref 96–112)
Chloride: 99 mmol/L (ref 96–112)
Creatinine, Ser: 1.1 mg/dL (ref 0.50–1.35)
Creatinine, Ser: 1.1 mg/dL (ref 0.50–1.35)
Creatinine, Ser: 1.2 mg/dL (ref 0.50–1.35)
GLUCOSE: 173 mg/dL — AB (ref 70–99)
GLUCOSE: 200 mg/dL — AB (ref 70–99)
GLUCOSE: 123 mg/dL — AB (ref 70–99)
GLUCOSE: 99 mg/dL (ref 70–99)
GLUCOSE: 140 mg/dL — AB (ref 70–99)
Glucose, Bld: 106 mg/dL — ABNORMAL HIGH (ref 70–99)
Glucose, Bld: 121 mg/dL — ABNORMAL HIGH (ref 70–99)
Glucose, Bld: 161 mg/dL — ABNORMAL HIGH (ref 70–99)
Glucose, Bld: 96 mg/dL (ref 70–99)
HCT: 28 % — ABNORMAL LOW (ref 39.0–52.0)
HCT: 29 % — ABNORMAL LOW (ref 39.0–52.0)
HCT: 39 % (ref 39.0–52.0)
HCT: 43 % (ref 39.0–52.0)
HCT: 45 % (ref 39.0–52.0)
HEMATOCRIT: 36 % — AB (ref 39.0–52.0)
HEMATOCRIT: 32 % — AB (ref 39.0–52.0)
HEMATOCRIT: 23 % — AB (ref 39.0–52.0)
HEMATOCRIT: 41 % (ref 39.0–52.0)
HEMOGLOBIN: 13.3 g/dL (ref 13.0–17.0)
HEMOGLOBIN: 15.3 g/dL (ref 13.0–17.0)
HEMOGLOBIN: 7.8 g/dL — AB (ref 13.0–17.0)
HEMOGLOBIN: 9.5 g/dL — AB (ref 13.0–17.0)
HEMOGLOBIN: 9.9 g/dL — AB (ref 13.0–17.0)
Hemoglobin: 14.6 g/dL (ref 13.0–17.0)
Hemoglobin: 12.2 g/dL — ABNORMAL LOW (ref 13.0–17.0)
Hemoglobin: 10.9 g/dL — ABNORMAL LOW (ref 13.0–17.0)
Hemoglobin: 13.9 g/dL (ref 13.0–17.0)
POTASSIUM: 5.4 mmol/L — AB (ref 3.5–5.1)
POTASSIUM: 4.6 mmol/L (ref 3.5–5.1)
POTASSIUM: 3.1 mmol/L — AB (ref 3.5–5.1)
Potassium: 4.4 mmol/L (ref 3.5–5.1)
Potassium: 4.4 mmol/L (ref 3.5–5.1)
Potassium: 4.4 mmol/L (ref 3.5–5.1)
Potassium: 4.6 mmol/L (ref 3.5–5.1)
Potassium: 4.8 mmol/L (ref 3.5–5.1)
Potassium: 5.2 mmol/L — ABNORMAL HIGH (ref 3.5–5.1)
SODIUM: 136 mmol/L (ref 135–145)
SODIUM: 133 mmol/L — AB (ref 135–145)
Sodium: 130 mmol/L — ABNORMAL LOW (ref 135–145)
Sodium: 134 mmol/L — ABNORMAL LOW (ref 135–145)
Sodium: 135 mmol/L (ref 135–145)
Sodium: 136 mmol/L (ref 135–145)
Sodium: 138 mmol/L (ref 135–145)
Sodium: 138 mmol/L (ref 135–145)
Sodium: 143 mmol/L (ref 135–145)
TCO2: 16 mmol/L (ref 0–100)
TCO2: 20 mmol/L (ref 0–100)
TCO2: 21 mmol/L (ref 0–100)
TCO2: 22 mmol/L (ref 0–100)
TCO2: 22 mmol/L (ref 0–100)
TCO2: 22 mmol/L (ref 0–100)
TCO2: 23 mmol/L (ref 0–100)
TCO2: 23 mmol/L (ref 0–100)
TCO2: 26 mmol/L (ref 0–100)

## 2015-04-05 LAB — POCT I-STAT 3, ART BLOOD GAS (G3+)
ACID-BASE DEFICIT: 2 mmol/L (ref 0.0–2.0)
ACID-BASE DEFICIT: 1 mmol/L (ref 0.0–2.0)
Acid-base deficit: 8 mmol/L — ABNORMAL HIGH (ref 0.0–2.0)
Acid-base deficit: 1 mmol/L (ref 0.0–2.0)
BICARBONATE: 17.6 meq/L — AB (ref 20.0–24.0)
Bicarbonate: 24.3 mEq/L — ABNORMAL HIGH (ref 20.0–24.0)
Bicarbonate: 24.6 mEq/L — ABNORMAL HIGH (ref 20.0–24.0)
Bicarbonate: 25.2 mEq/L — ABNORMAL HIGH (ref 20.0–24.0)
O2 SAT: 100 %
O2 Saturation: 100 %
O2 Saturation: 98 %
O2 Saturation: 95 %
PH ART: 7.326 — AB (ref 7.350–7.450)
PH ART: 7.364 (ref 7.350–7.450)
PO2 ART: 80 mmHg (ref 80.0–100.0)
TCO2: 26 mmol/L (ref 0–100)
TCO2: 19 mmol/L (ref 0–100)
TCO2: 26 mmol/L (ref 0–100)
TCO2: 27 mmol/L (ref 0–100)
pCO2 arterial: 46.5 mmHg — ABNORMAL HIGH (ref 35.0–45.0)
pCO2 arterial: 34.5 mmHg — ABNORMAL LOW (ref 35.0–45.0)
pCO2 arterial: 43.1 mmHg (ref 35.0–45.0)
pCO2 arterial: 45.4 mmHg — ABNORMAL HIGH (ref 35.0–45.0)
pH, Arterial: 7.315 — ABNORMAL LOW (ref 7.350–7.450)
pH, Arterial: 7.353 (ref 7.350–7.450)
pO2, Arterial: 235 mmHg — ABNORMAL HIGH (ref 80.0–100.0)
pO2, Arterial: 194 mmHg — ABNORMAL HIGH (ref 80.0–100.0)
pO2, Arterial: 114 mmHg — ABNORMAL HIGH (ref 80.0–100.0)

## 2015-04-05 LAB — COMPREHENSIVE METABOLIC PANEL
ALK PHOS: 115 U/L (ref 39–117)
ALT: 30 U/L (ref 0–53)
ANION GAP: 11 (ref 5–15)
AST: 22 U/L (ref 0–37)
Albumin: 3.2 g/dL — ABNORMAL LOW (ref 3.5–5.2)
BUN: 17 mg/dL (ref 6–23)
CO2: 24 mmol/L (ref 19–32)
Calcium: 9 mg/dL (ref 8.4–10.5)
Chloride: 102 mmol/L (ref 96–112)
Creatinine, Ser: 1.22 mg/dL (ref 0.50–1.35)
GFR calc non Af Amer: 66 mL/min — ABNORMAL LOW (ref >90)
GFR, EST AFRICAN AMERICAN: 76 mL/min — AB (ref >90)
GLUCOSE: 112 mg/dL — AB (ref 70–99)
POTASSIUM: 4.1 mmol/L (ref 3.5–5.1)
SODIUM: 137 mmol/L (ref 135–145)
Total Bilirubin: 1.5 mg/dL — ABNORMAL HIGH (ref 0.3–1.2)
Total Protein: 6.5 g/dL (ref 6.0–8.3)

## 2015-04-05 LAB — FIBRINOGEN: FIBRINOGEN: 280 mg/dL (ref 204–475)

## 2015-04-05 LAB — SURGICAL PCR SCREEN
MRSA, PCR: NEGATIVE
Staphylococcus aureus: POSITIVE — AB

## 2015-04-05 LAB — PROTIME-INR
INR: 0.89 (ref 0.00–1.49)
Prothrombin Time: 12.1 seconds (ref 11.6–15.2)

## 2015-04-05 LAB — POCT I-STAT 4, (NA,K, GLUC, HGB,HCT)
Glucose, Bld: 144 mg/dL — ABNORMAL HIGH (ref 70–99)
HCT: 39 % (ref 39.0–52.0)
HEMOGLOBIN: 13.3 g/dL (ref 13.0–17.0)
POTASSIUM: 5.4 mmol/L — AB (ref 3.5–5.1)
SODIUM: 137 mmol/L (ref 135–145)

## 2015-04-05 LAB — PLATELET COUNT: Platelets: 141 10*3/uL — ABNORMAL LOW (ref 150–400)

## 2015-04-05 LAB — PREPARE RBC (CROSSMATCH)

## 2015-04-05 LAB — GLUCOSE, CAPILLARY: Glucose-Capillary: 95 mg/dL (ref 70–99)

## 2015-04-05 LAB — APTT: aPTT: 35 seconds (ref 24–37)

## 2015-04-05 SURGERY — REPLACEMENT, AORTIC VALVE, OPEN
Anesthesia: General | Site: Chest

## 2015-04-05 MED ORDER — ONDANSETRON HCL 4 MG/2ML IJ SOLN
4.0000 mg | Freq: Four times a day (QID) | INTRAMUSCULAR | Status: DC | PRN
  Filled 2015-04-05: qty 2

## 2015-04-05 MED ORDER — MUPIROCIN 2 % EX OINT
1.0000 "application " | TOPICAL_OINTMENT | Freq: Two times a day (BID) | CUTANEOUS | Status: AC
  Administered 2015-04-05 – 2015-04-09 (×8): 1 via NASAL
  Filled 2015-04-05: qty 22

## 2015-04-05 MED ORDER — SODIUM CHLORIDE 0.9 % IJ SOLN
3.0000 mL | INTRAMUSCULAR | Status: DC | PRN
  Administered 2015-04-09: 3 mL via INTRAVENOUS
  Filled 2015-04-05: qty 3

## 2015-04-05 MED ORDER — SODIUM CHLORIDE 0.9 % IV SOLN
20.0000 ug | Freq: Once | INTRAVENOUS | Status: AC
  Administered 2015-04-05: 20 ug via INTRAVENOUS
  Filled 2015-04-05: qty 5

## 2015-04-05 MED ORDER — ALBUMIN HUMAN 5 % IV SOLN
250.0000 mL | INTRAVENOUS | Status: AC | PRN
  Administered 2015-04-05 – 2015-04-06 (×2): 250 mL via INTRAVENOUS
  Filled 2015-04-05: qty 250

## 2015-04-05 MED ORDER — METOPROLOL TARTRATE 12.5 MG HALF TABLET
12.5000 mg | ORAL_TABLET | Freq: Two times a day (BID) | ORAL | Status: DC
  Administered 2015-04-09 – 2015-04-11 (×5): 12.5 mg via ORAL
  Filled 2015-04-05 (×13): qty 1

## 2015-04-05 MED ORDER — MIDAZOLAM HCL 5 MG/5ML IJ SOLN
INTRAMUSCULAR | Status: DC | PRN
  Administered 2015-04-05 (×2): 2 mg via INTRAVENOUS
  Administered 2015-04-05: 3 mg via INTRAVENOUS
  Administered 2015-04-05: 1 mg via INTRAVENOUS
  Administered 2015-04-05 (×2): 3 mg via INTRAVENOUS

## 2015-04-05 MED ORDER — MORPHINE SULFATE 2 MG/ML IJ SOLN: 1.0000 mg | INTRAMUSCULAR | Status: AC | PRN

## 2015-04-05 MED ORDER — HEPARIN SODIUM (PORCINE) 1000 UNIT/ML IJ SOLN
INTRAMUSCULAR | Status: AC
  Filled 2015-04-05: qty 1

## 2015-04-05 MED ORDER — FENTANYL CITRATE (PF) 250 MCG/5ML IJ SOLN
INTRAMUSCULAR | Status: AC
  Filled 2015-04-05: qty 5

## 2015-04-05 MED ORDER — PROTAMINE SULFATE 10 MG/ML IV SOLN
INTRAVENOUS | Status: AC
  Filled 2015-04-05: qty 25

## 2015-04-05 MED ORDER — MORPHINE SULFATE 2 MG/ML IJ SOLN
2.0000 mg | INTRAMUSCULAR | Status: DC | PRN
  Administered 2015-04-06 – 2015-04-07 (×7): 2 mg via INTRAVENOUS
  Administered 2015-04-07 – 2015-04-08 (×3): 4 mg via INTRAVENOUS
  Administered 2015-04-09: 2 mg via INTRAVENOUS
  Administered 2015-04-09: 4 mg via INTRAVENOUS
  Administered 2015-04-09: 2 mg via INTRAVENOUS
  Administered 2015-04-10: 4 mg via INTRAVENOUS
  Filled 2015-04-05: qty 1
  Filled 2015-04-05 (×3): qty 2
  Filled 2015-04-05 (×3): qty 1
  Filled 2015-04-05: qty 2
  Filled 2015-04-05 (×2): qty 1
  Filled 2015-04-05 (×2): qty 2
  Filled 2015-04-05 (×3): qty 1

## 2015-04-05 MED ORDER — ASPIRIN 81 MG PO CHEW
324.0000 mg | CHEWABLE_TABLET | Freq: Every day | ORAL | Status: DC
  Administered 2015-04-10 – 2015-04-15 (×5): 324 mg
  Filled 2015-04-05 (×6): qty 4

## 2015-04-05 MED ORDER — MIDAZOLAM HCL 2 MG/2ML IJ SOLN
INTRAMUSCULAR | Status: AC
  Filled 2015-04-05: qty 2

## 2015-04-05 MED ORDER — PROTAMINE SULFATE 10 MG/ML IV SOLN
INTRAVENOUS | Status: DC | PRN
  Administered 2015-04-05: 350 mg via INTRAVENOUS
  Administered 2015-04-05: 50 mg via INTRAVENOUS

## 2015-04-05 MED ORDER — LIDOCAINE HCL (CARDIAC) 20 MG/ML IV SOLN
INTRAVENOUS | Status: AC
  Filled 2015-04-05: qty 5

## 2015-04-05 MED ORDER — SODIUM CHLORIDE 0.9 % IJ SOLN
3.0000 mL | Freq: Two times a day (BID) | INTRAMUSCULAR | Status: DC
  Administered 2015-04-06: 6 mL via INTRAVENOUS
  Administered 2015-04-06 – 2015-04-22 (×12): 3 mL via INTRAVENOUS

## 2015-04-05 MED ORDER — FAMOTIDINE IN NACL 20-0.9 MG/50ML-% IV SOLN
20.0000 mg | Freq: Two times a day (BID) | INTRAVENOUS | Status: DC
  Administered 2015-04-05: 20 mg via INTRAVENOUS

## 2015-04-05 MED ORDER — PHENYLEPHRINE HCL 10 MG/ML IJ SOLN
0.0000 ug/min | INTRAVENOUS | Status: DC
  Administered 2015-04-06: 50 ug/min via INTRAVENOUS
  Administered 2015-04-06: 20 ug/min via INTRAVENOUS
  Filled 2015-04-05 (×3): qty 2

## 2015-04-05 MED ORDER — PHENYLEPHRINE HCL 10 MG/ML IJ SOLN
INTRAMUSCULAR | Status: AC
  Filled 2015-04-05: qty 2

## 2015-04-05 MED ORDER — GLYCOPYRROLATE 0.2 MG/ML IJ SOLN
INTRAMUSCULAR | Status: DC | PRN
  Administered 2015-04-05 (×2): .2 mg via INTRAVENOUS

## 2015-04-05 MED ORDER — ARTIFICIAL TEARS OP OINT
TOPICAL_OINTMENT | OPHTHALMIC | Status: AC
  Filled 2015-04-05: qty 3.5

## 2015-04-05 MED ORDER — AMIODARONE HCL IN DEXTROSE 360-4.14 MG/200ML-% IV SOLN
30.0000 mg/h | INTRAVENOUS | Status: DC
  Administered 2015-04-05 – 2015-04-09 (×7): 30 mg/h via INTRAVENOUS
  Filled 2015-04-05 (×14): qty 200

## 2015-04-05 MED ORDER — ACETAMINOPHEN 160 MG/5ML PO SOLN
1000.0000 mg | Freq: Four times a day (QID) | ORAL | Status: AC
  Administered 2015-04-05 – 2015-04-06 (×2): 1000 mg
  Filled 2015-04-05: qty 40.6

## 2015-04-05 MED ORDER — LACTATED RINGERS IV SOLN
INTRAVENOUS | Status: DC
Start: 2015-04-05 — End: 2015-04-14

## 2015-04-05 MED ORDER — VANCOMYCIN HCL IN DEXTROSE 1-5 GM/200ML-% IV SOLN
1000.0000 mg | Freq: Once | INTRAVENOUS | Status: AC
  Administered 2015-04-05: 1000 mg via INTRAVENOUS
  Filled 2015-04-05: qty 200

## 2015-04-05 MED ORDER — FAMOTIDINE IN NACL 20-0.9 MG/50ML-% IV SOLN
20.0000 mg | Freq: Two times a day (BID) | INTRAVENOUS | Status: AC
  Administered 2015-04-06: 20 mg via INTRAVENOUS
  Filled 2015-04-05: qty 50

## 2015-04-05 MED ORDER — SODIUM CHLORIDE 0.9 % IV SOLN: Freq: Once | INTRAVENOUS | Status: DC

## 2015-04-05 MED ORDER — VECURONIUM BROMIDE 10 MG IV SOLR
INTRAVENOUS | Status: AC
  Filled 2015-04-05: qty 10

## 2015-04-05 MED ORDER — MILRINONE IN DEXTROSE 20 MG/100ML IV SOLN
0.1250 ug/kg/min | INTRAVENOUS | Status: AC
  Administered 2015-04-05: .3 ug/kg/min via INTRAVENOUS
  Filled 2015-04-05: qty 100

## 2015-04-05 MED ORDER — ALBUMIN HUMAN 5 % IV SOLN
INTRAVENOUS | Status: DC | PRN
  Administered 2015-04-05: 17:00:00 via INTRAVENOUS

## 2015-04-05 MED ORDER — METOPROLOL TARTRATE 25 MG/10 ML ORAL SUSPENSION
12.5000 mg | Freq: Two times a day (BID) | ORAL | Status: DC
  Filled 2015-04-05 (×13): qty 5

## 2015-04-05 MED ORDER — ACETAMINOPHEN 160 MG/5ML PO SOLN: 650.0000 mg | Freq: Once | ORAL | Status: AC

## 2015-04-05 MED ORDER — CALCIUM CHLORIDE 10 % IV SOLN
INTRAVENOUS | Status: AC
  Filled 2015-04-05: qty 10

## 2015-04-05 MED ORDER — INSULIN REGULAR BOLUS VIA INFUSION
0.0000 [IU] | Freq: Three times a day (TID) | INTRAVENOUS | Status: DC
  Filled 2015-04-05: qty 10

## 2015-04-05 MED ORDER — ASPIRIN EC 325 MG PO TBEC
325.0000 mg | DELAYED_RELEASE_TABLET | Freq: Every day | ORAL | Status: DC
  Administered 2015-04-06 – 2015-04-23 (×11): 325 mg via ORAL
  Filled 2015-04-05 (×18): qty 1

## 2015-04-05 MED ORDER — DEXMEDETOMIDINE HCL IN NACL 200 MCG/50ML IV SOLN
0.0000 ug/kg/h | INTRAVENOUS | Status: DC
  Administered 2015-04-05 – 2015-04-06 (×4): 0.7 ug/kg/h via INTRAVENOUS
  Filled 2015-04-05 (×6): qty 50

## 2015-04-05 MED ORDER — PHENYLEPHRINE 40 MCG/ML (10ML) SYRINGE FOR IV PUSH (FOR BLOOD PRESSURE SUPPORT)
PREFILLED_SYRINGE | INTRAVENOUS | Status: AC
  Filled 2015-04-05: qty 10

## 2015-04-05 MED ORDER — CHLORHEXIDINE GLUCONATE CLOTH 2 % EX PADS: 6.0000 | MEDICATED_PAD | Freq: Every day | CUTANEOUS | Status: DC

## 2015-04-05 MED ORDER — ARTIFICIAL TEARS OP OINT
TOPICAL_OINTMENT | OPHTHALMIC | Status: DC | PRN
  Administered 2015-04-05: 1 via OPHTHALMIC

## 2015-04-05 MED ORDER — NOREPINEPHRINE BITARTRATE 1 MG/ML IV SOLN: 0.0000 ug/min | INTRAVENOUS | Status: DC

## 2015-04-05 MED ORDER — ACETAMINOPHEN 500 MG PO TABS
1000.0000 mg | ORAL_TABLET | Freq: Four times a day (QID) | ORAL | Status: AC
  Administered 2015-04-09 – 2015-04-10 (×5): 1000 mg via ORAL
  Filled 2015-04-05 (×18): qty 2

## 2015-04-05 MED ORDER — VECURONIUM BROMIDE 10 MG IV SOLR
INTRAVENOUS | Status: AC
  Filled 2015-04-05: qty 20

## 2015-04-05 MED ORDER — DEXMEDETOMIDINE HCL IN NACL 200 MCG/50ML IV SOLN
INTRAVENOUS | Status: AC
  Filled 2015-04-05: qty 50

## 2015-04-05 MED ORDER — PROTAMINE SULFATE 10 MG/ML IV SOLN
INTRAVENOUS | Status: AC
Start: 2015-04-05 — End: 2015-04-05
  Filled 2015-04-05: qty 25

## 2015-04-05 MED ORDER — METOPROLOL TARTRATE 1 MG/ML IV SOLN
2.5000 mg | INTRAVENOUS | Status: DC | PRN
  Administered 2015-04-07: 5 mg via INTRAVENOUS
  Administered 2015-04-07: 2.5 mg via INTRAVENOUS
  Administered 2015-04-07: 5 mg via INTRAVENOUS
  Administered 2015-04-08: 2.5 mg via INTRAVENOUS
  Administered 2015-04-08: 5 mg via INTRAVENOUS
  Administered 2015-04-08: 4 mg via INTRAVENOUS
  Administered 2015-04-09: 5 mg via INTRAVENOUS
  Filled 2015-04-05 (×6): qty 5

## 2015-04-05 MED ORDER — LACTATED RINGERS IV SOLN
INTRAVENOUS | Status: DC | PRN
  Administered 2015-04-05 (×6): via INTRAVENOUS

## 2015-04-05 MED ORDER — COAGULATION FACTOR VIIA RECOMB 1 MG IV SOLR
45.0000 ug/kg | Freq: Once | INTRAVENOUS | Status: AC
  Administered 2015-04-05: 5000 ug via INTRAVENOUS
  Filled 2015-04-05: qty 5

## 2015-04-05 MED ORDER — BISACODYL 10 MG RE SUPP
10.0000 mg | Freq: Every day | RECTAL | Status: DC
  Administered 2015-04-10: 10 mg via RECTAL
  Filled 2015-04-05: qty 1

## 2015-04-05 MED ORDER — CALCIUM CHLORIDE 10 % IV SOLN
INTRAVENOUS | Status: DC | PRN
  Administered 2015-04-05 (×2): 200 mg via INTRAVENOUS

## 2015-04-05 MED ORDER — INSULIN REGULAR HUMAN 100 UNIT/ML IJ SOLN
INTRAMUSCULAR | Status: DC
  Filled 2015-04-05: qty 2.5

## 2015-04-05 MED ORDER — SODIUM CHLORIDE 0.9 % IJ SOLN
INTRAMUSCULAR | Status: DC | PRN
  Administered 2015-04-05: 4 mL via TOPICAL

## 2015-04-05 MED ORDER — SODIUM CHLORIDE 0.9 % IR SOLN
Status: DC | PRN
  Administered 2015-04-05: 6000 mL

## 2015-04-05 MED ORDER — ROCURONIUM BROMIDE 100 MG/10ML IV SOLN
INTRAVENOUS | Status: DC | PRN
  Administered 2015-04-05: 50 mg via INTRAVENOUS

## 2015-04-05 MED ORDER — SODIUM CHLORIDE 0.9 % IV SOLN
0.5000 g/h | Freq: Once | INTRAVENOUS | Status: DC
  Filled 2015-04-05: qty 20

## 2015-04-05 MED ORDER — LACTATED RINGERS IV SOLN
INTRAVENOUS | Status: DC
  Administered 2015-04-06: 20 mL/h via INTRAVENOUS

## 2015-04-05 MED ORDER — DOPAMINE-DEXTROSE 3.2-5 MG/ML-% IV SOLN
0.0000 ug/kg/min | INTRAVENOUS | Status: DC
  Administered 2015-04-07: 3 ug/kg/min via INTRAVENOUS
  Filled 2015-04-05: qty 250

## 2015-04-05 MED ORDER — MIDAZOLAM HCL 2 MG/2ML IJ SOLN
2.0000 mg | INTRAMUSCULAR | Status: DC | PRN
  Administered 2015-04-06 (×2): 2 mg via INTRAVENOUS
  Filled 2015-04-05 (×2): qty 2

## 2015-04-05 MED ORDER — TRAMADOL HCL 50 MG PO TABS: 50.0000 mg | ORAL_TABLET | ORAL | Status: DC | PRN

## 2015-04-05 MED ORDER — HEMOSTATIC AGENTS (NO CHARGE) OPTIME
TOPICAL | Status: DC | PRN
  Administered 2015-04-05 (×8): 1 via TOPICAL

## 2015-04-05 MED ORDER — SODIUM BICARBONATE 8.4 % IV SOLN
INTRAVENOUS | Status: DC | PRN
  Administered 2015-04-05: 50 meq via INTRAVENOUS

## 2015-04-05 MED ORDER — HEPARIN SODIUM (PORCINE) 1000 UNIT/ML IJ SOLN
INTRAMUSCULAR | Status: DC | PRN
  Administered 2015-04-05: 30000 [IU] via INTRAVENOUS

## 2015-04-05 MED ORDER — POTASSIUM CHLORIDE 10 MEQ/50ML IV SOLN: 10.0000 meq | INTRAVENOUS | Status: AC

## 2015-04-05 MED ORDER — OXYCODONE HCL 5 MG PO TABS
5.0000 mg | ORAL_TABLET | ORAL | Status: DC | PRN
  Administered 2015-04-10 – 2015-04-13 (×13): 5 mg via ORAL
  Administered 2015-04-14 (×2): 10 mg via ORAL
  Administered 2015-04-15 – 2015-04-16 (×4): 5 mg via ORAL
  Administered 2015-04-16: 10 mg via ORAL
  Administered 2015-04-16: 5 mg via ORAL
  Administered 2015-04-17 (×2): 10 mg via ORAL
  Administered 2015-04-18: 5 mg via ORAL
  Administered 2015-04-18 – 2015-04-22 (×9): 10 mg via ORAL
  Filled 2015-04-05 (×2): qty 2
  Filled 2015-04-05 (×2): qty 1
  Filled 2015-04-05: qty 2
  Filled 2015-04-05: qty 1
  Filled 2015-04-05 (×2): qty 2
  Filled 2015-04-05: qty 1
  Filled 2015-04-05: qty 2
  Filled 2015-04-05: qty 1
  Filled 2015-04-05 (×3): qty 2
  Filled 2015-04-05 (×9): qty 1
  Filled 2015-04-05: qty 2
  Filled 2015-04-05: qty 1
  Filled 2015-04-05 (×2): qty 2
  Filled 2015-04-05: qty 1
  Filled 2015-04-05: qty 2
  Filled 2015-04-05 (×2): qty 1
  Filled 2015-04-05 (×2): qty 2
  Filled 2015-04-05: qty 1

## 2015-04-05 MED ORDER — EPHEDRINE SULFATE 50 MG/ML IJ SOLN
INTRAMUSCULAR | Status: DC | PRN
  Administered 2015-04-05: 5 mg via INTRAVENOUS

## 2015-04-05 MED ORDER — CALCIUM CHLORIDE 10 % IV SOLN
1.0000 g | Freq: Once | INTRAVENOUS | Status: AC
  Administered 2015-04-05: 1 g via INTRAVENOUS

## 2015-04-05 MED ORDER — PROPOFOL 10 MG/ML IV BOLUS
INTRAVENOUS | Status: AC
  Filled 2015-04-05: qty 20

## 2015-04-05 MED ORDER — MIDAZOLAM HCL 2 MG/2ML IJ SOLN
INTRAMUSCULAR | Status: AC
Start: 2015-04-05 — End: 2015-04-05
  Filled 2015-04-05: qty 2

## 2015-04-05 MED ORDER — ACETAMINOPHEN 10 MG/ML IV SOLN
1000.0000 mg | Freq: Once | INTRAVENOUS | Status: AC
  Administered 2015-04-05: 1000 mg via INTRAVENOUS
  Filled 2015-04-05: qty 100

## 2015-04-05 MED ORDER — MAGNESIUM SULFATE 4 GM/100ML IV SOLN
4.0000 g | Freq: Once | INTRAVENOUS | Status: AC
  Administered 2015-04-05: 4 g via INTRAVENOUS
  Filled 2015-04-05: qty 100

## 2015-04-05 MED ORDER — ROCURONIUM BROMIDE 50 MG/5ML IV SOLN
INTRAVENOUS | Status: AC
  Filled 2015-04-05: qty 1

## 2015-04-05 MED ORDER — SODIUM CHLORIDE 0.9 % IR SOLN
Status: DC | PRN
  Administered 2015-04-05: 1000 mL

## 2015-04-05 MED ORDER — BISACODYL 5 MG PO TBEC
10.0000 mg | DELAYED_RELEASE_TABLET | Freq: Every day | ORAL | Status: DC
  Administered 2015-04-09 – 2015-04-21 (×3): 10 mg via ORAL
  Filled 2015-04-05 (×4): qty 2

## 2015-04-05 MED ORDER — NOREPINEPHRINE BITARTRATE 1 MG/ML IV SOLN
0.0000 ug/min | INTRAVENOUS | Status: AC
  Administered 2015-04-05: 5 ug/min via INTRAVENOUS
  Filled 2015-04-05: qty 4

## 2015-04-05 MED ORDER — FENTANYL CITRATE (PF) 100 MCG/2ML IJ SOLN
INTRAMUSCULAR | Status: DC | PRN
  Administered 2015-04-05: 250 ug via INTRAVENOUS
  Administered 2015-04-05: 200 ug via INTRAVENOUS
  Administered 2015-04-05: 50 ug via INTRAVENOUS
  Administered 2015-04-05 (×3): 250 ug via INTRAVENOUS
  Administered 2015-04-05: 500 ug via INTRAVENOUS
  Administered 2015-04-05 (×2): 250 ug via INTRAVENOUS

## 2015-04-05 MED ORDER — MILRINONE IN DEXTROSE 20 MG/100ML IV SOLN
0.2500 ug/kg/min | INTRAVENOUS | Status: DC
  Administered 2015-04-05 – 2015-04-09 (×7): 0.3 ug/kg/min via INTRAVENOUS
  Filled 2015-04-05 (×11): qty 100

## 2015-04-05 MED ORDER — SODIUM CHLORIDE 0.9 % IV SOLN
INTRAVENOUS | Status: DC
  Administered 2015-04-12: 10 mL/h via INTRAVENOUS

## 2015-04-05 MED ORDER — MIDAZOLAM HCL 10 MG/2ML IJ SOLN
INTRAMUSCULAR | Status: AC
  Filled 2015-04-05: qty 2

## 2015-04-05 MED ORDER — VECURONIUM BROMIDE 10 MG IV SOLR
INTRAVENOUS | Status: DC | PRN
  Administered 2015-04-05: 3 mg via INTRAVENOUS
  Administered 2015-04-05 (×3): 5 mg via INTRAVENOUS
  Administered 2015-04-05: 2 mg via INTRAVENOUS
  Administered 2015-04-05 (×2): 5 mg via INTRAVENOUS

## 2015-04-05 MED ORDER — AMIODARONE HCL IN DEXTROSE 360-4.14 MG/200ML-% IV SOLN: 30.0000 mg/h | INTRAVENOUS | Status: DC

## 2015-04-05 MED ORDER — PROPOFOL 10 MG/ML IV BOLUS
INTRAVENOUS | Status: DC | PRN
  Administered 2015-04-05: 20 mg via INTRAVENOUS

## 2015-04-05 MED ORDER — DOCUSATE SODIUM 100 MG PO CAPS
200.0000 mg | ORAL_CAPSULE | Freq: Every day | ORAL | Status: DC
  Administered 2015-04-09 – 2015-04-21 (×4): 200 mg via ORAL
  Filled 2015-04-05 (×14): qty 2

## 2015-04-05 MED ORDER — SODIUM CHLORIDE 0.45 % IV SOLN
INTRAVENOUS | Status: DC | PRN
  Administered 2015-04-05: 18:00:00 via INTRAVENOUS

## 2015-04-05 MED ORDER — SODIUM CHLORIDE 0.9 % IV SOLN
Freq: Once | INTRAVENOUS | Status: AC
  Administered 2015-04-05: 14:00:00 via INTRAVENOUS

## 2015-04-05 MED ORDER — ACETAMINOPHEN 650 MG RE SUPP
650.0000 mg | Freq: Once | RECTAL | Status: AC
  Administered 2015-04-05: 650 mg via RECTAL

## 2015-04-05 MED ORDER — DEXTROSE 5 % IV SOLN
1.5000 g | Freq: Two times a day (BID) | INTRAVENOUS | Status: AC
  Administered 2015-04-05 – 2015-04-07 (×4): 1.5 g via INTRAVENOUS
  Filled 2015-04-05 (×4): qty 1.5

## 2015-04-05 MED ORDER — SODIUM CHLORIDE 0.9 % IV SOLN: 250.0000 mL | INTRAVENOUS | Status: DC

## 2015-04-05 MED ORDER — PROTAMINE SULFATE 10 MG/ML IV SOLN
INTRAVENOUS | Status: AC
  Filled 2015-04-05: qty 10

## 2015-04-05 MED ORDER — NITROGLYCERIN IN D5W 200-5 MCG/ML-% IV SOLN
0.0000 ug/min | INTRAVENOUS | Status: DC
Start: 2015-04-05 — End: 2015-04-10

## 2015-04-05 MED ORDER — PANTOPRAZOLE SODIUM 40 MG PO TBEC: 40.0000 mg | DELAYED_RELEASE_TABLET | Freq: Every day | ORAL | Status: DC

## 2015-04-05 MED ORDER — LACTATED RINGERS IV SOLN: 500.0000 mL | Freq: Once | INTRAVENOUS | Status: AC | PRN

## 2015-04-05 SURGICAL SUPPLY — 139 items
ADAPTER CARDIO PERF ANTE/RETRO (ADAPTER) ×4 IMPLANT
ADH SRG 12 PREFL SYR 3 SPRDR (MISCELLANEOUS)
ADPR PRFSN 84XANTGRD RTRGD (ADAPTER) ×2
APL SRG 7X2 LUM MLBL SLNT (VASCULAR PRODUCTS) ×6
APPLICATOR TIP COSEAL (VASCULAR PRODUCTS) ×6 IMPLANT
BAG DECANTER FOR FLEXI CONT (MISCELLANEOUS) ×4 IMPLANT
BANDAGE HEMOSTAT MRDH 4X4 STRL (MISCELLANEOUS) IMPLANT
BLADE STERNUM SYSTEM 6 (BLADE) ×4 IMPLANT
BLADE SURG 12 STRL SS (BLADE) ×4 IMPLANT
BLADE SURG 15 STRL LF DISP TIS (BLADE) ×2 IMPLANT
BLADE SURG 15 STRL SS (BLADE) ×4
BNDG HEMOSTAT MRDH 4X4 STRL (MISCELLANEOUS) ×4
CANISTER SUCTION 2500CC (MISCELLANEOUS) ×6 IMPLANT
CANN PRFSN 3/8XCNCT ST RT ANG (MISCELLANEOUS) ×2
CANN PRFSN 3/8XRT ANG TPR 14 (MISCELLANEOUS) ×2
CANNULA AORTIC ROOT 9FR (CANNULA) ×2 IMPLANT
CANNULA EZ GLIDE AORTIC 21FR (CANNULA) ×2 IMPLANT
CANNULA GRAFT 8MMX50CM (Graft) IMPLANT
CANNULA GUNDRY RCSP 15FR (MISCELLANEOUS) ×6 IMPLANT
CANNULA PRFSN 3/8XCNCT RT ANG (MISCELLANEOUS) IMPLANT
CANNULA PRFSN 3/8XRT ANG TPR14 (MISCELLANEOUS) IMPLANT
CANNULA SUMP PERICARDIAL (CANNULA) ×2 IMPLANT
CANNULA VEN MTL TIP RT (MISCELLANEOUS) ×8
CARDIOBLATE CARDIAC ABLATION (MISCELLANEOUS) ×4
CATH CPB KIT VANTRIGT (MISCELLANEOUS) ×4 IMPLANT
CATH HEART VENT LEFT (CATHETERS) ×2 IMPLANT
CATH RETROPLEGIA CORONARY 14FR (CATHETERS) IMPLANT
CATH ROBINSON RED A/P 18FR (CATHETERS) ×16 IMPLANT
CATH THORACIC 36FR RT ANG (CATHETERS) ×4 IMPLANT
CATH/SQUID NICHOLS JEHLE COR (CATHETERS) ×2 IMPLANT
CAUTERY SURG HI TEMP FINE TIP (MISCELLANEOUS) ×2 IMPLANT
CLIP FOGARTY SPRING 6M (CLIP) IMPLANT
CONN 1/2X1/2X1/2  BEN (MISCELLANEOUS) ×2
CONN 1/2X1/2X1/2 BEN (MISCELLANEOUS) IMPLANT
CONT SPECI 4OZ STER CLIK (MISCELLANEOUS) ×4 IMPLANT
COUNTER NEEDLE 20 DBL MAG RED (NEEDLE) ×2 IMPLANT
COVER SURGICAL LIGHT HANDLE (MISCELLANEOUS) ×8 IMPLANT
CRADLE DONUT ADULT HEAD (MISCELLANEOUS) ×4 IMPLANT
DEVICE CARDIOBLATE CARDIAC ABL (MISCELLANEOUS) IMPLANT
DRAIN CHANNEL 32F RND 10.7 FF (WOUND CARE) ×4 IMPLANT
DRAIN JACKSON PRATT 10MM FLAT (MISCELLANEOUS) ×2 IMPLANT
DRAPE CARDIOVASCULAR INCISE (DRAPES) ×4
DRAPE SLUSH/WARMER DISC (DRAPES) ×4 IMPLANT
DRAPE SRG 135X102X78XABS (DRAPES) ×2 IMPLANT
DRSG AQUACEL AG ADV 3.5X14 (GAUZE/BANDAGES/DRESSINGS) ×4 IMPLANT
ELECT BLADE 4.0 EZ CLEAN MEGAD (MISCELLANEOUS) ×4
ELECT BLADE 6.5 EXT (BLADE) ×4 IMPLANT
ELECT CAUTERY BLADE 6.4 (BLADE) ×4 IMPLANT
ELECT REM PT RETURN 9FT ADLT (ELECTROSURGICAL) ×8
ELECTRODE BLDE 4.0 EZ CLN MEGD (MISCELLANEOUS) ×2 IMPLANT
ELECTRODE REM PT RTRN 9FT ADLT (ELECTROSURGICAL) ×4 IMPLANT
EVACUATOR SILICONE 100CC (DRAIN) ×2 IMPLANT
GAUZE SPONGE 4X4 12PLY STRL (GAUZE/BANDAGES/DRESSINGS) ×8 IMPLANT
GLOVE BIO SURGEON STRL SZ 6 (GLOVE) ×8 IMPLANT
GLOVE BIO SURGEON STRL SZ 6.5 (GLOVE) ×4 IMPLANT
GLOVE BIO SURGEON STRL SZ7 (GLOVE) ×8 IMPLANT
GLOVE BIO SURGEON STRL SZ7.5 (GLOVE) ×16 IMPLANT
GLOVE BIO SURGEONS STRL SZ 6.5 (GLOVE) ×4
GLOVE BIOGEL PI IND STRL 6 (GLOVE) IMPLANT
GLOVE BIOGEL PI IND STRL 6.5 (GLOVE) IMPLANT
GLOVE BIOGEL PI IND STRL 7.0 (GLOVE) IMPLANT
GLOVE BIOGEL PI INDICATOR 6 (GLOVE) ×4
GLOVE BIOGEL PI INDICATOR 6.5 (GLOVE) ×8
GLOVE BIOGEL PI INDICATOR 7.0 (GLOVE) ×4
GOWN STRL REUS W/ TWL LRG LVL3 (GOWN DISPOSABLE) ×8 IMPLANT
GOWN STRL REUS W/TWL LRG LVL3 (GOWN DISPOSABLE) ×40
GRAFT WOVEN D/V 30DX30L (Vascular Products) ×2 IMPLANT
HEMOSTAT POWDER SURGIFOAM 1G (HEMOSTASIS) ×12 IMPLANT
HEMOSTAT SURGICEL 2X14 (HEMOSTASIS) ×4 IMPLANT
INSERT FOGARTY XLG (MISCELLANEOUS) ×2 IMPLANT
KIT BASIN OR (CUSTOM PROCEDURE TRAY) ×4 IMPLANT
KIT REMOVER STAPLE SKIN (MISCELLANEOUS) ×2 IMPLANT
KIT ROOM TURNOVER OR (KITS) ×4 IMPLANT
KIT SUCTION CATH 14FR (SUCTIONS) ×4 IMPLANT
LEAD PACING MYOCARDI (MISCELLANEOUS) ×4 IMPLANT
LINE VENT (MISCELLANEOUS) ×2 IMPLANT
LOOP VESSEL SUPERMAXI WHITE (MISCELLANEOUS) ×2 IMPLANT
NDL AORTIC AIR ASPIRATING (NEEDLE) IMPLANT
NEEDLE AORTIC AIR ASPIRATING (NEEDLE) ×4 IMPLANT
NS IRRIG 1000ML POUR BTL (IV SOLUTION) ×26 IMPLANT
PACK OPEN HEART (CUSTOM PROCEDURE TRAY) ×4 IMPLANT
PAD ARMBOARD 7.5X6 YLW CONV (MISCELLANEOUS) ×8 IMPLANT
PROBE CRYO2-ABLATION MALLABLE (MISCELLANEOUS) IMPLANT
SEALANT SURG COSEAL 8ML (VASCULAR PRODUCTS) ×6 IMPLANT
SET CARDIOPLEGIA MPS 5001102 (MISCELLANEOUS) ×2 IMPLANT
SPONGE GAUZE 4X4 12PLY STER LF (GAUZE/BANDAGES/DRESSINGS) ×4 IMPLANT
SPONGE INTESTINAL PEANUT (DISPOSABLE) ×2 IMPLANT
SPONGE LAP 18X18 X RAY DECT (DISPOSABLE) ×2 IMPLANT
SPONGE LAP 4X18 X RAY DECT (DISPOSABLE) ×4 IMPLANT
STAPLER VISISTAT 35W (STAPLE) ×2 IMPLANT
STOPCOCK 4 WAY LG BORE MALE ST (IV SETS) ×2 IMPLANT
SURGIFLO W/THROMBIN 8M KIT (HEMOSTASIS) ×10 IMPLANT
SUT BONE WAX W31G (SUTURE) ×4 IMPLANT
SUT ETHIBON 2 0 V 52N 30 (SUTURE) ×8 IMPLANT
SUT ETHIBOND 2 0 SH (SUTURE) ×12
SUT ETHIBOND 2 0 SH 36X2 (SUTURE) ×2 IMPLANT
SUT PROLENE 3 0 RB 1 (SUTURE) ×4 IMPLANT
SUT PROLENE 3 0 SH 1 (SUTURE) IMPLANT
SUT PROLENE 3 0 SH DA (SUTURE) IMPLANT
SUT PROLENE 3 0 SH1 36 (SUTURE) ×8 IMPLANT
SUT PROLENE 4 0 RB 1 (SUTURE) ×164
SUT PROLENE 4 0 SH DA (SUTURE) ×10 IMPLANT
SUT PROLENE 4-0 RB1 .5 CRCL 36 (SUTURE) ×6 IMPLANT
SUT PROLENE 5 0 C 1 36 (SUTURE) ×8 IMPLANT
SUT PROLENE 6 0 C 1 30 (SUTURE) ×18 IMPLANT
SUT SILK 0 TIES 10X30 (SUTURE) ×2 IMPLANT
SUT SILK 1 TIES 10X30 (SUTURE) ×2 IMPLANT
SUT SILK 2 0 SH CR/8 (SUTURE) ×2 IMPLANT
SUT SILK 3 0 SH CR/8 (SUTURE) ×2 IMPLANT
SUT SILK 3 0 TIES 10X30 (SUTURE) ×2 IMPLANT
SUT STEEL 6MS V (SUTURE) ×8 IMPLANT
SUT STEEL STERNAL CCS#1 18IN (SUTURE) ×2 IMPLANT
SUT STEEL SZ 6 DBL 3X14 BALL (SUTURE) ×4 IMPLANT
SUT VIC AB 1 CTX 18 (SUTURE) ×2 IMPLANT
SUT VIC AB 1 CTX 36 (SUTURE) ×8
SUT VIC AB 1 CTX36XBRD ANBCTR (SUTURE) ×4 IMPLANT
SUT VIC AB 2-0 CT1 27 (SUTURE) ×4
SUT VIC AB 2-0 CT1 TAPERPNT 27 (SUTURE) IMPLANT
SUT VIC AB 2-0 CTX 27 (SUTURE) ×2 IMPLANT
SUT VIC AB 2-0 CTX 36 (SUTURE) ×2 IMPLANT
SUT VIC AB 3-0 SH 8-18 (SUTURE) ×2 IMPLANT
SUT VIC AB 3-0 X1 27 (SUTURE) ×2 IMPLANT
SYR 10ML KIT SKIN ADHESIVE (MISCELLANEOUS) IMPLANT
SYS ATRICLIP LAA EXCLUSION 45 (CLIP) IMPLANT
SYSTEM SAHARA CHEST DRAIN ATS (WOUND CARE) ×4 IMPLANT
TAPE CLOTH SURG 4X10 WHT LF (GAUZE/BANDAGES/DRESSINGS) ×4 IMPLANT
TAPE PAPER 2X10 WHT MICROPORE (GAUZE/BANDAGES/DRESSINGS) ×2 IMPLANT
TOWEL OR 17X24 6PK STRL BLUE (TOWEL DISPOSABLE) ×6 IMPLANT
TOWEL OR 17X26 10 PK STRL BLUE (TOWEL DISPOSABLE) ×8 IMPLANT
TRAY CATH LUMEN 1 20CM STRL (SET/KITS/TRAYS/PACK) ×2 IMPLANT
TRAY FOLEY IC TEMP SENS 16FR (CATHETERS) ×4 IMPLANT
TUBE CONNECTING 12'X1/4 (SUCTIONS) ×1
TUBE CONNECTING 12X1/4 (SUCTIONS) ×1 IMPLANT
TUBING ART PRESS 48 MALE/FEM (TUBING) ×4 IMPLANT
UNDERPAD 30X30 INCONTINENT (UNDERPADS AND DIAPERS) ×4 IMPLANT
VALVE MAGNA EASE AORTIC 27MM (Prosthesis & Implant Heart) ×2 IMPLANT
VENT LEFT HEART 12002 (CATHETERS) ×4
WATER STERILE IRR 1000ML POUR (IV SOLUTION) ×8 IMPLANT
YANKAUER SUCT BULB TIP NO VENT (SUCTIONS) ×2 IMPLANT

## 2015-04-05 NOTE — Transfer of Care (Signed)
Immediate Anesthesia Transfer of Care Note  Patient: [name]  Procedure(s) Performed: Procedure(s): AORTIC VALVE REPLACEMENT (AVR) using a 28mm Edwards Aortic Magna Ease Valve  (N/A) TRANSESOPHAGEAL ECHOCARDIOGRAM (TEE) (N/A) MAZE (N/A) REPLACEMENT ASCENDING AORTA with a 40mm Hemashield Platinum Graft (N/A)  Patient Location: SICU  Anesthesia Type:General  Level of Consciousness: sedated and Patient remains intubated per anesthesia plan  Airway & Oxygen Therapy: Patient remains intubated per anesthesia plan and Patient placed on Ventilator (see vital sign flow sheet for setting)  Post-op Assessment: Report given to RN and Post -op Vital signs reviewed and stable  Post vital signs: Reviewed and stable  Last Vitals:  BP  105/62  HR 90 (A paced) SpO2 99  Complications: No apparent anesthesia complications

## 2015-04-05 NOTE — Anesthesia Preprocedure Evaluation (Addendum)
Anesthesia Evaluation  Patient identified by MRN, date of birth, ID band Patient awake    Reviewed: Allergy & Precautions, NPO status , Patient's Chart, lab work & pertinent test results, reviewed documented beta blocker date and time   History of Anesthesia Complications Negative for: history of anesthetic complications  Airway Mallampati: II  TM Distance: >3 FB Neck ROM: Full    Dental  (+) Teeth Intact, Dental Advisory Given   Pulmonary shortness of breath,  breath sounds clear to auscultation        Cardiovascular hypertension, Pt. on medications and Pt. on home beta blockers +CHF + dysrhythmias Atrial Fibrillation + Valvular Problems/Murmurs (severe AS with probable bicuspid valve, peak grad 28) AS Rhythm:Regular Rate:Normal + Systolic murmurs EF 11-65%   Neuro/Psych negative neurological ROS  negative psych ROS   GI/Hepatic Neg liver ROS, GERD-  Medicated and Controlled,  Endo/Other  Morbid obesity  Renal/GU Renal InsufficiencyRenal disease (creat 1.44)     Musculoskeletal   Abdominal (+) + obese,   Peds  Hematology  (+) Blood dyscrasia (plavix), ,   Anesthesia Other Findings   Reproductive/Obstetrics                           Anesthesia Physical Anesthesia Plan  ASA: IV  Anesthesia Plan: General   Post-op Pain Management:    Induction: Intravenous  Airway Management Planned: Oral ETT  Additional Equipment: Arterial line, PA Cath, 3D TEE and Ultrasound Guidance Line Placement  Intra-op Plan: Utilization Of Total Body Hypothermia per surgeon request and Delibrate Circulatory arrest per surgeon request  Post-operative Plan: Post-operative intubation/ventilation  Informed Consent: I have reviewed the patients History and Physical, chart, labs and discussed the procedure including the risks, benefits and alternatives for the proposed anesthesia with the patient or authorized  representative who has indicated his/her understanding and acceptance.   Dental advisory given  Plan Discussed with: CRNA and Surgeon  Anesthesia Plan Comments: (Plan routine monitors, A line, PA cath, GETA with TEE, hypothermic circ arrest, post op ventilation)        Anesthesia Quick Evaluation

## 2015-04-05 NOTE — Anesthesia Procedure Notes (Addendum)
Anesthesia Procedure Note Central line insertion note: Risks and benefits explained. Pt prepped and draped in sterile fashion. Right IJ identified on u/s and needle advanced under live u/s guidance. Positive aspiration of blood and catheter advanced over needle. Wire passed freely through catheter and catheter removed. Introducer placed over guidewire and guidewire removed intact. Positive aspiration of blood through ports. Line sutured. Pt tolerated procedure well with no immediate complications.  Jonathon Canter, MD  Procedure Name: Intubation Date/Time: 04/05/2015 7:45 AM Performed by: Lowella Dell Pre-anesthesia Checklist: Patient identified, Timeout performed, Emergency Drugs available, Suction available and Patient being monitored Patient Re-evaluated:Patient Re-evaluated prior to inductionOxygen Delivery Method: Circle system utilized Preoxygenation: Pre-oxygenation with 100% oxygen Intubation Type: IV induction Ventilation: Mask ventilation without difficulty Laryngoscope Size: Mac and 4 Grade View: Grade I Tube type: Subglottic suction tube Tube size: 8.0 mm Number of attempts: 1 Airway Equipment and Method: Stylet Placement Confirmation: ETT inserted through vocal cords under direct vision,  breath sounds checked- equal and bilateral and positive ETCO2 Secured at: 24 cm Tube secured with: Tape Dental Injury: Teeth and Oropharynx as per pre-operative assessment

## 2015-04-05 NOTE — Progress Notes (Signed)
Dr. Prescott Gum to bedside.  Received the following verbal orders:  -Begin Nitric wean at 0100 to have pt off Nitric by AM -May wean Dopamine to 3 -Begin Amio gtt at 30mg /hr -Give IV tylenol for fever -Give Albumin with amp of Ca+ -Continue Primacor  Will initiate orders and monitor closely.

## 2015-04-05 NOTE — Brief Op Note (Addendum)
03/27/2015 - 04/05/2015      [address]       [place] [address]             [phone]     03/27/2015 - 04/05/2015  1:31 PM  PATIENT:  [name]  54 y.o. male  PRE-OPERATIVE DIAGNOSIS:  SEVERE AS/AI  POST-OPERATIVE DIAGNOSIS:  Severe Aortic Stenosis and insuffuciency, Atrial flutter, Ascending Aortic Aneursym  PROCEDURE:  Procedure(s): AORTIC VALVE REPLACEMENT (AVR) using a 48mm Edwards Aortic Magna Ease Valve  TRANSESOPHAGEAL ECHOCARDIOGRAM (TEE) MAZE(RIGHT ATRIAL SET) REPLACEMENT ASCENDING AORTA with a 16mm Hemashield Platinum Graft  SURGEON:  Surgeon(s): [name], MD  PHYSICIAN ASSISTANT: WAYNE GOLD PA-C  ANESTHESIA:   general  PATIENT CONDITION:  ICU - intubated and hemodynamically stable.  PRE-OPERATIVE WEIGHT: 101kg  EBL : SEE ANEST/PERFUSION RECORDS  COMPLICATIONS: NO KNOWN Aortic Valve  Procedure Performed:  Replacement: Yes.  Bioprosthetic Valve. Implant Model Number:[id number], Size:27, Unique Device Identifier:[id number]  Repair/Reconstruction: No.   Aortic Annular Enlargement:NO Aortic Valve Etiology   Aortic Insufficiency:  Severe  Aortic Valve Disease:  Yes.  Aortic Stenosis:  Yes. Smallest Aortic Valve Area: 1.7cm2; Highest Mean Gradient: 22mmHg.  Etiology (Choose at least one and up to  5 etiologies):  Bicuspid valve disease and Degenerative - Calcified  Maze Procedure  Radiofrequency:  Yes.  Bipolar: Yes.  Cut-and-sew:  No.  Cryo: No     Lesions (select all that apply):   15a  RAA Lateral Wall (Short) and   15b  RAA Lateral Wall to "T" Lesion

## 2015-04-05 NOTE — Progress Notes (Signed)
The patient was examined and preop studies reviewed. There has been no change from the prior exam and the patient is ready for surgery.   Plan AVR, replace ascending aorta and Maze procedure on [name] today

## 2015-04-05 NOTE — Progress Notes (Signed)
Patient ID: [name], male   DOB: 1961/09/14, 54 y.o.   MRN: [id number]  SICU Evening Rounds:   Hemodynamically stable , AV paced 90 CI = 2.4 on dop 4 , milrinone 0.3, levophed 10  Asleep on vent, just back from the OR  Urine output good  CT output low  CBC    Component Value Date/Time   WBC 7.0 04/04/2015 0505   RBC 5.14 04/04/2015 0505   HGB 13.3 04/05/2015 1731   HCT 39.0 04/05/2015 1731   PLT 141* 04/05/2015 1300   MCV 83.3 04/04/2015 0505   MCH 27.0 04/04/2015 0505   MCHC 32.5 04/04/2015 0505   RDW 15.3 04/04/2015 0505   LYMPHSABS 1.8 03/27/2015 1720   MONOABS 0.3 03/27/2015 1720   EOSABS 0.0 03/27/2015 1720   BASOSABS 0.0 03/27/2015 1720     BMET    Component Value Date/Time   NA 137 04/05/2015 1731   K 5.4* 04/05/2015 1731   CL 99 04/05/2015 1503   CO2 24 04/05/2015 0705   GLUCOSE 144* 04/05/2015 1731   BUN 18 04/05/2015 1503   CREATININE 1.20 04/05/2015 1503   CALCIUM 9.0 04/05/2015 0705   GFRNONAA 66* 04/05/2015 0705   GFRAA 76* 04/05/2015 0705     A/P:  Stable postop course. Continue current plans

## 2015-04-05 NOTE — Progress Notes (Signed)
  Echocardiogram  Echocardiogram Transesophageal has been performed.  Darlina Sicilian M 04/05/2015, 8:40 AM

## 2015-04-06 ENCOUNTER — Inpatient Hospital Stay (HOSPITAL_COMMUNITY): Payer: Medicaid Other

## 2015-04-06 LAB — CBC
HCT: 36 % — ABNORMAL LOW (ref 39.0–52.0)
HCT: 31.1 % — ABNORMAL LOW (ref 39.0–52.0)
Hemoglobin: 11.8 g/dL — ABNORMAL LOW (ref 13.0–17.0)
Hemoglobin: 10.5 g/dL — ABNORMAL LOW (ref 13.0–17.0)
MCH: 26.5 pg (ref 26.0–34.0)
MCH: 27.3 pg (ref 26.0–34.0)
MCHC: 32.8 g/dL (ref 30.0–36.0)
MCHC: 33.8 g/dL (ref 30.0–36.0)
MCV: 80.7 fL (ref 78.0–100.0)
MCV: 81 fL (ref 78.0–100.0)
Platelets: 181 10*3/uL (ref 150–400)
Platelets: 147 10*3/uL — ABNORMAL LOW (ref 150–400)
RBC: 4.46 MIL/uL (ref 4.22–5.81)
RBC: 3.84 MIL/uL — ABNORMAL LOW (ref 4.22–5.81)
RDW: 15 % (ref 11.5–15.5)
RDW: 15.2 % (ref 11.5–15.5)
WBC: 16.5 10*3/uL — AB (ref 4.0–10.5)
WBC: 15.1 10*3/uL — ABNORMAL HIGH (ref 4.0–10.5)

## 2015-04-06 LAB — POCT I-STAT 3, ART BLOOD GAS (G3+)
ACID-BASE DEFICIT: 5 mmol/L — AB (ref 0.0–2.0)
Acid-base deficit: 2 mmol/L (ref 0.0–2.0)
Acid-base deficit: 3 mmol/L — ABNORMAL HIGH (ref 0.0–2.0)
BICARBONATE: 20 meq/L (ref 20.0–24.0)
BICARBONATE: 22.7 meq/L (ref 20.0–24.0)
Bicarbonate: 21.5 mEq/L (ref 20.0–24.0)
O2 SAT: 92 %
O2 Saturation: 97 %
O2 Saturation: 90 %
PCO2 ART: 36.7 mmHg (ref 35.0–45.0)
PH ART: 7.382 (ref 7.350–7.450)
PO2 ART: 63 mmHg — AB (ref 80.0–100.0)
Patient temperature: 38.2
Patient temperature: 38
TCO2: 21 mmol/L (ref 0–100)
TCO2: 24 mmol/L (ref 0–100)
TCO2: 23 mmol/L (ref 0–100)
pCO2 arterial: 38.1 mmHg (ref 35.0–45.0)
pCO2 arterial: 38.7 mmHg (ref 35.0–45.0)
pH, Arterial: 7.334 — ABNORMAL LOW (ref 7.350–7.450)
pH, Arterial: 7.379 (ref 7.350–7.450)
pO2, Arterial: 107 mmHg — ABNORMAL HIGH (ref 80.0–100.0)
pO2, Arterial: 68 mmHg — ABNORMAL LOW (ref 80.0–100.0)

## 2015-04-06 LAB — PREPARE CRYOPRECIPITATE
UNIT DIVISION: 0
UNIT DIVISION: 0

## 2015-04-06 LAB — PREPARE FRESH FROZEN PLASMA
Unit division: 0
Unit division: 0

## 2015-04-06 LAB — BASIC METABOLIC PANEL
Anion gap: 10 (ref 5–15)
BUN: 16 mg/dL (ref 6–23)
CO2: 23 mmol/L (ref 19–32)
CREATININE: 1.42 mg/dL — AB (ref 0.50–1.35)
Calcium: 8.8 mg/dL (ref 8.4–10.5)
Chloride: 105 mmol/L (ref 96–112)
GFR, EST AFRICAN AMERICAN: 63 mL/min — AB (ref >90)
GFR, EST NON AFRICAN AMERICAN: 55 mL/min — AB (ref >90)
Glucose, Bld: 128 mg/dL — ABNORMAL HIGH (ref 70–99)
Potassium: 5.3 mmol/L — ABNORMAL HIGH (ref 3.5–5.1)
Sodium: 138 mmol/L (ref 135–145)

## 2015-04-06 LAB — GLUCOSE, CAPILLARY
GLUCOSE-CAPILLARY: 129 mg/dL — AB (ref 70–99)
GLUCOSE-CAPILLARY: 140 mg/dL — AB (ref 70–99)
GLUCOSE-CAPILLARY: 128 mg/dL — AB (ref 70–99)
GLUCOSE-CAPILLARY: 86 mg/dL (ref 70–99)
Glucose-Capillary: 103 mg/dL — ABNORMAL HIGH (ref 70–99)
Glucose-Capillary: 109 mg/dL — ABNORMAL HIGH (ref 70–99)
Glucose-Capillary: 129 mg/dL — ABNORMAL HIGH (ref 70–99)
Glucose-Capillary: 132 mg/dL — ABNORMAL HIGH (ref 70–99)
Glucose-Capillary: 134 mg/dL — ABNORMAL HIGH (ref 70–99)
Glucose-Capillary: 155 mg/dL — ABNORMAL HIGH (ref 70–99)
Glucose-Capillary: 96 mg/dL (ref 70–99)

## 2015-04-06 LAB — POCT I-STAT, CHEM 8
BUN: 18 mg/dL (ref 6–23)
CHLORIDE: 101 mmol/L (ref 96–112)
Calcium, Ion: 1.24 mmol/L — ABNORMAL HIGH (ref 1.12–1.23)
Creatinine, Ser: 1.1 mg/dL (ref 0.50–1.35)
Glucose, Bld: 169 mg/dL — ABNORMAL HIGH (ref 70–99)
HEMATOCRIT: 33 % — AB (ref 39.0–52.0)
Hemoglobin: 11.2 g/dL — ABNORMAL LOW (ref 13.0–17.0)
Potassium: 4.5 mmol/L (ref 3.5–5.1)
SODIUM: 137 mmol/L (ref 135–145)
TCO2: 20 mmol/L (ref 0–100)

## 2015-04-06 LAB — CREATININE, SERUM
CREATININE: 1.31 mg/dL (ref 0.50–1.35)
Creatinine, Ser: 1.41 mg/dL — ABNORMAL HIGH (ref 0.50–1.35)
GFR calc Af Amer: 64 mL/min — ABNORMAL LOW (ref >90)
GFR calc Af Amer: 70 mL/min — ABNORMAL LOW (ref >90)
GFR calc non Af Amer: 55 mL/min — ABNORMAL LOW (ref >90)
GFR calc non Af Amer: 60 mL/min — ABNORMAL LOW (ref >90)

## 2015-04-06 LAB — PREPARE PLATELET PHERESIS
Unit division: 0
Unit division: 0

## 2015-04-06 LAB — CARBOXYHEMOGLOBIN
Carboxyhemoglobin: 1.1 % (ref 0.5–1.5)
Methemoglobin: 1 % (ref 0.0–1.5)
O2 Saturation: 62.1 %
Total hemoglobin: 11.4 g/dL — ABNORMAL LOW (ref 13.5–18.0)

## 2015-04-06 LAB — MAGNESIUM
MAGNESIUM: 2 mg/dL (ref 1.5–2.5)
Magnesium: 2.6 mg/dL — ABNORMAL HIGH (ref 1.5–2.5)
Magnesium: 2.2 mg/dL (ref 1.5–2.5)

## 2015-04-06 MED ORDER — SODIUM BICARBONATE 8.4 % IV SOLN
50.0000 meq | Freq: Once | INTRAVENOUS | Status: AC
  Administered 2015-04-06: 50 meq via INTRAVENOUS
  Filled 2015-04-06: qty 50

## 2015-04-06 MED ORDER — INSULIN ASPART 100 UNIT/ML ~~LOC~~ SOLN
0.0000 [IU] | SUBCUTANEOUS | Status: DC
  Administered 2015-04-06: 2 [IU] via SUBCUTANEOUS
  Administered 2015-04-06: 4 [IU] via SUBCUTANEOUS
  Administered 2015-04-06 – 2015-04-10 (×18): 2 [IU] via SUBCUTANEOUS

## 2015-04-06 MED ORDER — INSULIN ASPART 100 UNIT/ML ~~LOC~~ SOLN: 0.0000 [IU] | SUBCUTANEOUS | Status: DC

## 2015-04-06 MED ORDER — AMIODARONE IV BOLUS ONLY 150 MG/100ML
150.0000 mg | Freq: Once | INTRAVENOUS | Status: AC
  Administered 2015-04-06: 150 mg via INTRAVENOUS
  Filled 2015-04-06: qty 100

## 2015-04-06 MED ORDER — FUROSEMIDE 10 MG/ML IJ SOLN
20.0000 mg | Freq: Once | INTRAMUSCULAR | Status: AC | PRN
Start: 2015-04-06 — End: 2015-04-06
  Administered 2015-04-06: 20 mg via INTRAVENOUS
  Filled 2015-04-06: qty 2

## 2015-04-06 MED FILL — Heparin Sodium (Porcine) Inj 1000 Unit/ML: INTRAMUSCULAR | Qty: 30 | Status: AC

## 2015-04-06 MED FILL — Sodium Chloride IV Soln 0.9%: INTRAVENOUS | Qty: 3000 | Status: AC

## 2015-04-06 MED FILL — Heparin Sodium (Porcine) Inj 1000 Unit/ML: INTRAMUSCULAR | Qty: 20 | Status: AC

## 2015-04-06 MED FILL — Sodium Bicarbonate IV Soln 8.4%: INTRAVENOUS | Qty: 50 | Status: AC

## 2015-04-06 MED FILL — Mannitol IV Soln 20%: INTRAVENOUS | Qty: 500 | Status: AC

## 2015-04-06 MED FILL — Magnesium Sulfate Inj 50%: INTRAMUSCULAR | Qty: 10 | Status: AC

## 2015-04-06 MED FILL — Potassium Chloride Inj 2 mEq/ML: INTRAVENOUS | Qty: 40 | Status: AC

## 2015-04-06 MED FILL — Electrolyte-R (PH 7.4) Solution: INTRAVENOUS | Qty: 4000 | Status: AC

## 2015-04-06 MED FILL — Lidocaine HCl IV Inj 20 MG/ML: INTRAVENOUS | Qty: 10 | Status: AC

## 2015-04-06 NOTE — Progress Notes (Signed)
Nitric turned off per wean protocol and Dr. Prescott Gum.

## 2015-04-06 NOTE — OR Nursing (Signed)
Late entry for delay code documentation. 

## 2015-04-06 NOTE — Procedures (Signed)
Extubation Procedure Note  Patient Details:   Name: [name] DOB: 01-Oct-1961 MRN: [id number]   Airway Documentation:     Evaluation  O2 sats: stable throughout and currently acceptable Complications: No apparent complications Patient did tolerate procedure well. Bilateral Breath Sounds: Rhonchi Suctioning: Airway Yes   Prior to extubation:  Pt suctioned orally, and via subglottic and ETT.  Positive cuff leak noted.  NIF -50, VC 1003. Post-extubation:  Pt able to cough to produce sputum, state his name, no stridor noted.  Miquel Dunn 04/06/2015, 9:43 AM

## 2015-04-06 NOTE — Progress Notes (Signed)
Patient ID: [name], male   DOB: March 19, 1961, 54 y.o.   MRN: [id number] EVENING ROUNDS NOTE :     [address]       ,[place] [address]             [phone]                 1 Day Post-Op Procedure(s) (LRB): AORTIC VALVE REPLACEMENT (AVR) using a 32mm Edwards Aortic Magna Ease Valve  (N/A) TRANSESOPHAGEAL ECHOCARDIOGRAM (TEE) (N/A) MAZE (N/A) REPLACEMENT ASCENDING AORTA with a 57mm Hemashield Platinum Graft (N/A)  Total Length of Stay:  LOS: 10 days  BP 106/55 mmHg  Pulse 91  Temp(Src) 100 F (37.8 C) (Oral)  Resp 31  Ht 5\' 11"  (1.803 m)  Wt 240 lb 15.4 oz (109.3 kg)  BMI 33.62 kg/m2  SpO2 95%  .Intake/Output      04/19 0701 - 04/20 0700   I.V. (mL/kg) 980.5 (9)   Blood    NG/GT 30   IV Piggyback 300   Total Intake(mL/kg) 1310.5 (12)   Urine (mL/kg/hr) 1290 (1)   Emesis/NG output 150 (0.1)   Drains 30 (0)   Other    Blood    Chest Tube 280 (0.2)   Total Output 1750   Net -439.5         . sodium chloride 10 mL/hr at 04/05/15 1743  . sodium chloride    . sodium chloride    . amiodarone 30 mg/hr (04/06/15 1800)  . dexmedetomidine 0.3 mcg/kg/hr (04/06/15 0900)  . DOPamine 3 mcg/kg/min (04/06/15 1800)  . insulin (NOVOLIN-R) infusion 1.4 mL/hr at 04/05/15 2000  . lactated ringers 20 mL/hr (04/06/15 1731)  . lactated ringers 20 mL/hr at 04/05/15 1800  . milrinone 0.3 mcg/kg/min (04/06/15 1800)  . nitroGLYCERIN Stopped (04/05/15 1739)  . norepinephrine (LEVOPHED) Adult infusion Stopped (04/05/15 2120)  . phenylephrine (NEO-SYNEPHRINE) Adult infusion 20 mcg/min (04/06/15 1800)     Lab Results  Component Value Date   WBC 15.1* 04/06/2015   HGB 11.2* 04/06/2015   HCT 33.0* 04/06/2015   PLT 147* 04/06/2015   GLUCOSE 169* 04/06/2015   ALT 30 04/05/2015   AST 22 04/05/2015   NA 137 04/06/2015   K 4.5 04/06/2015   CL 101 04/06/2015   CREATININE 1.10 04/06/2015   BUN 18 04/06/2015   CO2 23 04/06/2015   TSH 3.356 03/29/2015   INR  0.89 04/05/2015   HGBA1C 6.9* 03/29/2015   Stable day   Grace Isaac MD  Beeper [phone] Office [phone] 04/06/2015 7:04 PM

## 2015-04-06 NOTE — Progress Notes (Signed)
UR Completed.  [phone]  

## 2015-04-07 ENCOUNTER — Encounter (HOSPITAL_COMMUNITY): Payer: Self-pay | Admitting: Cardiothoracic Surgery

## 2015-04-07 ENCOUNTER — Inpatient Hospital Stay (HOSPITAL_COMMUNITY): Payer: Medicaid Other

## 2015-04-07 LAB — POCT I-STAT 3, ART BLOOD GAS (G3+)
Acid-base deficit: 1 mmol/L (ref 0.0–2.0)
Bicarbonate: 24 mEq/L (ref 20.0–24.0)
O2 SAT: 93 %
TCO2: 25 mmol/L (ref 0–100)
pCO2 arterial: 38.4 mmHg (ref 35.0–45.0)
pH, Arterial: 7.403 (ref 7.350–7.450)
pO2, Arterial: 65 mmHg — ABNORMAL LOW (ref 80.0–100.0)

## 2015-04-07 LAB — POCT I-STAT, CHEM 8
BUN: 27 mg/dL — AB (ref 6–23)
CHLORIDE: 104 mmol/L (ref 96–112)
Calcium, Ion: 1.31 mmol/L — ABNORMAL HIGH (ref 1.12–1.23)
Creatinine, Ser: 1.5 mg/dL — ABNORMAL HIGH (ref 0.50–1.35)
Glucose, Bld: 137 mg/dL — ABNORMAL HIGH (ref 70–99)
HCT: 42 % (ref 39.0–52.0)
HEMOGLOBIN: 14.3 g/dL (ref 13.0–17.0)
POTASSIUM: 4.4 mmol/L (ref 3.5–5.1)
SODIUM: 133 mmol/L — AB (ref 135–145)
TCO2: 22 mmol/L (ref 0–100)

## 2015-04-07 LAB — GLUCOSE, CAPILLARY
GLUCOSE-CAPILLARY: 130 mg/dL — AB (ref 70–99)
Glucose-Capillary: 128 mg/dL — ABNORMAL HIGH (ref 70–99)
Glucose-Capillary: 130 mg/dL — ABNORMAL HIGH (ref 70–99)
Glucose-Capillary: 131 mg/dL — ABNORMAL HIGH (ref 70–99)
Glucose-Capillary: 149 mg/dL — ABNORMAL HIGH (ref 70–99)

## 2015-04-07 LAB — TYPE AND SCREEN
ABO/RH(D): AB POS
ANTIBODY SCREEN: NEGATIVE
UNIT DIVISION: 0
Unit division: 0
Unit division: 0
Unit division: 0

## 2015-04-07 LAB — EXPECTORATED SPUTUM ASSESSMENT W GRAM STAIN, RFLX TO RESP C

## 2015-04-07 LAB — CBC
HCT: 31.7 % — ABNORMAL LOW (ref 39.0–52.0)
Hemoglobin: 10.5 g/dL — ABNORMAL LOW (ref 13.0–17.0)
MCH: 26.9 pg (ref 26.0–34.0)
MCHC: 33.1 g/dL (ref 30.0–36.0)
MCV: 81.3 fL (ref 78.0–100.0)
Platelets: 165 10*3/uL (ref 150–400)
RBC: 3.9 MIL/uL — ABNORMAL LOW (ref 4.22–5.81)
RDW: 15.2 % (ref 11.5–15.5)
WBC: 18.2 10*3/uL — ABNORMAL HIGH (ref 4.0–10.5)

## 2015-04-07 LAB — BASIC METABOLIC PANEL WITH GFR
Anion gap: 9 (ref 5–15)
BUN: 21 mg/dL (ref 6–23)
CO2: 24 mmol/L (ref 19–32)
Calcium: 8.6 mg/dL (ref 8.4–10.5)
Chloride: 103 mmol/L (ref 96–112)
Creatinine, Ser: 1.51 mg/dL — ABNORMAL HIGH (ref 0.50–1.35)
GFR calc Af Amer: 59 mL/min — ABNORMAL LOW
GFR calc non Af Amer: 51 mL/min — ABNORMAL LOW
Glucose, Bld: 153 mg/dL — ABNORMAL HIGH (ref 70–99)
Potassium: 4.6 mmol/L (ref 3.5–5.1)
Sodium: 136 mmol/L (ref 135–145)

## 2015-04-07 LAB — CARBOXYHEMOGLOBIN
Carboxyhemoglobin: 1 % (ref 0.5–1.5)
Methemoglobin: 0.9 % (ref 0.0–1.5)
O2 Saturation: 57.8 %
Total hemoglobin: 11 g/dL — ABNORMAL LOW (ref 13.5–18.0)

## 2015-04-07 MED ORDER — DEXTROSE 5 % IV SOLN
1.0000 g | Freq: Three times a day (TID) | INTRAVENOUS | Status: AC
  Administered 2015-04-07 – 2015-04-18 (×34): 1 g via INTRAVENOUS
  Filled 2015-04-07 (×37): qty 1

## 2015-04-07 MED ORDER — FUROSEMIDE 10 MG/ML IJ SOLN
40.0000 mg | Freq: Two times a day (BID) | INTRAMUSCULAR | Status: DC
  Administered 2015-04-07 – 2015-04-10 (×7): 40 mg via INTRAVENOUS
  Filled 2015-04-07 (×10): qty 4

## 2015-04-07 MED ORDER — CHLORHEXIDINE GLUCONATE 0.12 % MT SOLN
15.0000 mL | Freq: Two times a day (BID) | OROMUCOSAL | Status: DC
  Administered 2015-04-07 – 2015-04-22 (×19): 15 mL via OROMUCOSAL
  Filled 2015-04-07 (×34): qty 15

## 2015-04-07 MED ORDER — PANTOPRAZOLE SODIUM 40 MG IV SOLR
40.0000 mg | INTRAVENOUS | Status: DC
  Administered 2015-04-07 – 2015-04-09 (×3): 40 mg via INTRAVENOUS
  Filled 2015-04-07 (×4): qty 40

## 2015-04-07 MED ORDER — HYDRALAZINE HCL 20 MG/ML IJ SOLN
10.0000 mg | Freq: Four times a day (QID) | INTRAMUSCULAR | Status: DC | PRN
  Administered 2015-04-07 – 2015-04-10 (×10): 10 mg via INTRAVENOUS
  Filled 2015-04-07 (×10): qty 1

## 2015-04-07 MED ORDER — CETYLPYRIDINIUM CHLORIDE 0.05 % MT LIQD
7.0000 mL | Freq: Two times a day (BID) | OROMUCOSAL | Status: DC
  Administered 2015-04-07 – 2015-04-21 (×18): 7 mL via OROMUCOSAL

## 2015-04-07 NOTE — Evaluation (Signed)
Clinical/Bedside Swallow Evaluation Patient Details  Name: [name] MRN: [id number] Date of Birth: October 05, 1961  Today's Date: 04/07/2015 Time: SLP Start Time (ACUTE ONLY): 1600 SLP Stop Time (ACUTE ONLY): 1705 SLP Time Calculation (min) (ACUTE ONLY): 65 min  Past Medical History:  Past Medical History  Diagnosis Date  . Insomnia   . CHF (congestive heart failure)   . Congenital insufficiency of aortic valve   . Congestive cardiomyopathy   . Atrial flutter   . Benign hypertensive heart and renal disease   . Essential hypertension, malignant   . SOB (shortness of breath)   . Murmur   . Aortic stenosis 11/19/2014  . 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 Benign essential HTN 11/19/2014  . Chronic systolic CHF (congestive heart failure) 11/19/2014   Past Surgical History:  Past Surgical History  Procedure Laterality Date  . Left and right heart catheterization with coronary angiogram N/A 11/27/2014    Procedure: LEFT AND RIGHT HEART CATHETERIZATION WITH CORONARY ANGIOGRAM;  Surgeon: [name], MD;  Location: [facility] CATH LAB;  Service: Cardiovascular;  Laterality: N/A;  . Aortic valve replacement N/A 04/05/2015    Procedure: AORTIC VALVE REPLACEMENT (AVR) using a 69mm Edwards Aortic Magna Ease Valve ;  Surgeon: [name], MD;  Location: [place];  Service: Open Heart Surgery;  Laterality: N/A;  . Tee without cardioversion N/A 04/05/2015    Procedure: TRANSESOPHAGEAL ECHOCARDIOGRAM (TEE);  Surgeon: [name], MD;  Location: [place];  Service: Open Heart Surgery;  Laterality: N/A;  . Maze N/A 04/05/2015    Procedure: MAZE;  Surgeon: [name], MD;  Location: [facility];  Service: Open Heart Surgery;  Laterality: N/A;  . Replacement ascending aorta N/A 04/05/2015    Procedure: REPLACEMENT ASCENDING AORTA with a 16mm Hemashield Platinum Graft;  Surgeon: [name], MD;  Location: [facility];  Service: Open Heart Surgery;  Laterality: N/A;   HPI:  2 days post-op aortic  valve replacment.  Pt was intubated for surgery and extubated within 24 hours,,on 4/19.  RN reports pt was groggy and slow to respond after extubation, but is more alert today.  PMH:  obesity, HTN, sliding Hiatal Hernia with reflux.   Assessment / Plan / Recommendation Clinical Impression  Pt with suspected reversible pharyngeal dysphagia s/p intubation.  SLP provided moderate assisance/cues to clear pharyngeal secretions prio to this evaluation.  Thick, yellow sputum was coughed up and cleared with Yankeur's.  Voice is hoarse with intermittent dysphoniz and low vocal intensity.  Pt was reluctant to try po's, stating "I'm afriad I will choke."  Pt did cough after ice chip, and reported the applesauce stuck in his throat.  With max verbal cues, pt was able to cough and bring up the applesauce that was likely stuck in the valleculae.  (applesauce was seen in the suction catheter).  Educated to and family to the need to remain NPO for now, with pt focusing on clearing secretions and improving/stregthening his voice quality.  SLP will f/u for potential improvment and readiness for further swallow evaluation in next 1-2 days.      Aspiration Risk  Severe    Diet Recommendation NPO;Alternative means - temporary   Medication Administration: Via alternative means    Other  Recommendations Recommended Consults: FEES   Follow Up Recommendations       Frequency and Duration        Pertinent Vitals/Pain No complaints         Swallow Study Prior Functional  Status       General HPI: 2 days post-op aortic valve replacment.  Pt was intubated for surgery and extubated within 24 hours,,on 4/19.  RN reports pt was groggy and slow to respond after extubation, but is more alert today.  PMH:  obesity, HTN, sliding Hiatal Hernia with reflux. Type of Study: Bedside swallow evaluation Previous Swallow Assessment: none Diet Prior to this Study: NPO Temperature Spikes Noted: Yes Respiratory Status: Nasal  cannula History of Recent Intubation: Yes Length of Intubations (days): 1 days Date extubated: 04/06/15 Behavior/Cognition: Alert;Cooperative;Pleasant mood;Requires cueing Oral Cavity - Dentition: Adequate natural dentition;Missing dentition;Poor condition (seen by hospital dentist and cleared for surgery) Self-Feeding Abilities: Needs assist Patient Positioning: Upright in chair Baseline Vocal Quality: Hoarse;Breathy;Low vocal intensity Volitional Cough: Weak Volitional Swallow: Unable to elicit    Oral/Motor/Sensory Function Overall Oral Motor/Sensory Function: Appears within functional limits for tasks assessed   Ice Chips Ice chips: Impaired Presentation: Spoon Pharyngeal Phase Impairments: Suspected delayed Swallow;Decreased hyoid-laryngeal movement;Wet Vocal Quality;Cough - Delayed;Throat Clearing - Immediate   Thin Liquid Thin Liquid: Impaired Presentation: Spoon Pharyngeal  Phase Impairments: Suspected delayed Swallow;Decreased hyoid-laryngeal movement;Multiple swallows;Cough - Immediate    Nectar Thick Nectar Thick Liquid: Not tested   Honey Thick Honey Thick Liquid: Not tested   Puree Puree: Impaired Presentation: Spoon Pharyngeal Phase Impairments: Suspected delayed Swallow;Decreased hyoid-laryngeal movement;Multiple swallows;Throat Clearing - Delayed;Cough - Delayed   Solid   GO    Solid: Not tested       Quinn Axe T 04/07/2015,5:05 PM

## 2015-04-07 NOTE — Progress Notes (Signed)
[facility] BRIEF SICU PROGRESS NOTE  2 Days Post-Op  S/P Procedure(s) (LRB): AORTIC VALVE REPLACEMENT (AVR) using a 36mm Edwards Aortic Magna Ease Valve  (N/A) TRANSESOPHAGEAL ECHOCARDIOGRAM (TEE) (N/A) MAZE (N/A) REPLACEMENT ASCENDING AORTA with a 53mm Hemashield Platinum Graft (N/A)   Stable day HR 80's w/ stable BP O2 sats 95-98% on 4 L/min. UOP adequate  Plan: Continue current plan  Rexene Alberts 04/07/2015 7:33 PM

## 2015-04-07 NOTE — Progress Notes (Signed)
2 Days Post-Op Procedure(s) (LRB): AORTIC VALVE REPLACEMENT (AVR) using a 2mm Edwards Aortic Magna Ease Valve  (N/A) TRANSESOPHAGEAL ECHOCARDIOGRAM (TEE) (N/A) MAZE (N/A) REPLACEMENT ASCENDING AORTA with a 35mm Hemashield Platinum Graft (N/A) Subjective: AVR-[place] for AI low EF, maze for preop aflutter OOB to chair CXR wet Hoarse with wet cough Objective: Vital signs in last 24 hours: Temp:  [98.5 F (36.9 C)-100.9 F (38.3 C)] 99.1 F (37.3 C) (04/20 0747) Pulse Rate:  [82-95] 82 (04/20 0830) Cardiac Rhythm:  [-] A-V Sequential paced (04/20 0800) Resp:  [0-38] 24 (04/20 0830) BP: (105-163)/(53-137) 131/96 mmHg (04/20 0800) SpO2:  [84 %-98 %] 95 % (04/20 0830) Arterial Line BP: (98-158)/(49-90) 158/88 mmHg (04/20 0830) Weight:  [234 lb 2.1 oz (106.2 kg)] 234 lb 2.1 oz (106.2 kg) (04/20 0500)  Hemodynamic parameters for last 24 hours: PAP: (33-44)/(24-32) 38/29 mmHg CO:  [4.8 L/min] 4.8 L/min CI:  [2.2 L/min/m2] 2.2 L/min/m2  Intake/Output from previous day: 04/19 0701 - 04/20 0700 In: 9562.9 [I.V.:9232.9; NG/GT:30; IV Piggyback:300] Out: 2510 [Urine:1860; Emesis/NG output:150; Drains:70; Chest Tube:430] Intake/Output this shift: Total I/O In: 101.5 [I.V.:51.5; IV Piggyback:50] Out: 70 [Urine:70]  Neuro intact- still groggy  Lab Results:  Recent Labs  04/06/15 1615 04/06/15 1622 04/07/15 0421  WBC 15.1*  --  18.2*  HGB 10.5* 11.2* 10.5*  HCT 31.1* 33.0* 31.7*  PLT 147*  --  165   BMET:  Recent Labs  04/06/15 0421  04/06/15 1622 04/07/15 0421  NA 138  --  137 136  K 5.3*  --  4.5 4.6  CL 105  --  101 103  CO2 23  --   --  24  GLUCOSE 128*  --  169* 153*  BUN 16  --  18 21  CREATININE 1.42*  < > 1.10 1.51*  CALCIUM 8.8  --   --  8.6  < > = values in this interval not displayed.  PT/INR:  Recent Labs  04/05/15 1740  LABPROT 12.1  INR 0.89   ABG    Component Value Date/Time   PHART 7.379 04/06/2015 1107   HCO3 21.5 04/06/2015 1107   TCO2 20  04/06/2015 1622   ACIDBASEDEF 3.0* 04/06/2015 1107   O2SAT 57.8 04/07/2015 0433   CBG (last 3)   Recent Labs  04/06/15 2331 04/07/15 0352 04/07/15 0745  GLUCAP 155* 149* 130*    Assessment/Plan: S/P Procedure(s) (LRB): AORTIC VALVE REPLACEMENT (AVR) using a 59mm Edwards Aortic Magna Ease Valve  (N/A) TRANSESOPHAGEAL ECHOCARDIOGRAM (TEE) (N/A) MAZE (N/A) REPLACEMENT ASCENDING AORTA with a 17mm Hemashield Platinum Graft (N/A)   Mobilize, diuresis. Cover lungs with [name]   LOS: 11 days    [name] 04/07/2015

## 2015-04-08 ENCOUNTER — Inpatient Hospital Stay (HOSPITAL_COMMUNITY): Payer: Medicaid Other

## 2015-04-08 DIAGNOSIS — I4892 Unspecified atrial flutter: Secondary | ICD-10-CM

## 2015-04-08 LAB — POCT I-STAT, CHEM 8
BUN: 28 mg/dL — AB (ref 6–23)
CALCIUM ION: 1.21 mmol/L (ref 1.12–1.23)
Chloride: 101 mmol/L (ref 96–112)
Creatinine, Ser: 1.3 mg/dL (ref 0.50–1.35)
GLUCOSE: 141 mg/dL — AB (ref 70–99)
HCT: 30 % — ABNORMAL LOW (ref 39.0–52.0)
HEMOGLOBIN: 10.2 g/dL — AB (ref 13.0–17.0)
Potassium: 3.8 mmol/L (ref 3.5–5.1)
Sodium: 139 mmol/L (ref 135–145)
TCO2: 23 mmol/L (ref 0–100)

## 2015-04-08 LAB — BASIC METABOLIC PANEL
Anion gap: 11 (ref 5–15)
BUN: 28 mg/dL — ABNORMAL HIGH (ref 6–23)
CO2: 25 mmol/L (ref 19–32)
Calcium: 8.8 mg/dL (ref 8.4–10.5)
Chloride: 101 mmol/L (ref 96–112)
Creatinine, Ser: 1.46 mg/dL — ABNORMAL HIGH (ref 0.50–1.35)
GFR calc Af Amer: 61 mL/min — ABNORMAL LOW (ref >90)
GFR calc non Af Amer: 53 mL/min — ABNORMAL LOW (ref >90)
Glucose, Bld: 146 mg/dL — ABNORMAL HIGH (ref 70–99)
Potassium: 4.2 mmol/L (ref 3.5–5.1)
Sodium: 137 mmol/L (ref 135–145)

## 2015-04-08 LAB — CARBOXYHEMOGLOBIN
Carboxyhemoglobin: 0.9 % (ref 0.5–1.5)
Methemoglobin: 0.7 % (ref 0.0–1.5)
O2 Saturation: 69.1 %
Total hemoglobin: 13.7 g/dL (ref 13.5–18.0)

## 2015-04-08 LAB — GLUCOSE, CAPILLARY
GLUCOSE-CAPILLARY: 135 mg/dL — AB (ref 70–99)
GLUCOSE-CAPILLARY: 132 mg/dL — AB (ref 70–99)
GLUCOSE-CAPILLARY: 130 mg/dL — AB (ref 70–99)
Glucose-Capillary: 138 mg/dL — ABNORMAL HIGH (ref 70–99)
Glucose-Capillary: 122 mg/dL — ABNORMAL HIGH (ref 70–99)

## 2015-04-08 MED ORDER — LEVALBUTEROL HCL 0.63 MG/3ML IN NEBU
INHALATION_SOLUTION | RESPIRATORY_TRACT | Status: AC
  Filled 2015-04-08: qty 3

## 2015-04-08 MED ORDER — LEVALBUTEROL HCL 0.63 MG/3ML IN NEBU
0.6300 mg | INHALATION_SOLUTION | Freq: Three times a day (TID) | RESPIRATORY_TRACT | Status: DC
  Administered 2015-04-08 – 2015-04-23 (×41): 0.63 mg via RESPIRATORY_TRACT
  Filled 2015-04-08 (×82): qty 3

## 2015-04-08 MED ORDER — LEVALBUTEROL HCL 0.63 MG/3ML IN NEBU
0.6300 mg | INHALATION_SOLUTION | RESPIRATORY_TRACT | Status: DC | PRN
  Administered 2015-04-08 – 2015-04-10 (×2): 0.63 mg via RESPIRATORY_TRACT
  Filled 2015-04-08: qty 3

## 2015-04-08 NOTE — Progress Notes (Signed)
Speech Language Pathology Treatment: Dysphagia  Patient Details Name: [name] MRN: [id number] DOB: 08-Jan-1961 Today's Date: 04/08/2015 Time: 1470-9295 SLP Time Calculation (min) (ACUTE ONLY): 18 min  Assessment / Plan / Recommendation Clinical Impression  Diagnostic treatment complete. Diagnostic and therapeutic po trials provided to assess readiness for instrumental testing. Vocal quality remains hoarse and with low vocal intensity although mildly improved per patient report. SOB and cough noted at baseline with difficulty expectorating audible lung congestion. Oral phase mildly delayed, reporting anxiety regarding swallow. Once swallow initiated, significant s/s of aspiration present characterized by immediate throat clearing/cough post swallow. Continue to suspect that dysphagia is acute and reversible in nature although surprised by severity given brief intubation time.Very slight lingual deviation to the left noted but no other signs to suggest CN involvement. Patient does have a h/o esophageal dysphagia which could contribute. Hopeful for improvement within the next 24 hours. With mild improvement at bedside, will proceed with FEES. Will f/u 4/22.     HPI HPI: 2 days post-op aortic valve replacment.  Pt was intubated for surgery and extubated within 24 hours,,on 4/19.  RN reports pt was groggy and slow to respond after extubation, but is more alert today.  PMH:  obesity, HTN, sliding Hiatal Hernia with reflux.   Pertinent Vitals Pain Assessment: No/denies pain  SLP Plan  Continue with current plan of care    Recommendations Diet recommendations: NPO (except small amount of ice chips after oral care) Medication Administration: Via alternative means              General recommendations: Rehab consult Oral Care Recommendations: Oral care BID;Oral care prior to ice chips Follow up Recommendations: Inpatient Rehab Plan: Continue with current plan of care    [place],  [place] [phone]   [place] 04/08/2015, 10:03 AM

## 2015-04-08 NOTE — Op Note (Signed)
NAME:  ELISHAH, ASHMORE NO.:  [id number]  MEDICAL RECORD NO.:  [id number]  LOCATION:                                FACILITY:  [facility]  PHYSICIAN:  [name], M.D.  DATE OF BIRTH:  July 18, 1961  DATE OF PROCEDURE:  04/05/2015 DATE OF DISCHARGE:  03/31/2015                              OPERATIVE REPORT   OPERATION: 1. Aortic valve replacement with a 27 mm pericardial  tissue valve-     [facility] Ease, serial [id number]. 2. Replacement of ascending thoracic aortic fusiform 5 cm aneurysm     with a 30 mm Hemashield straight graft from the sino-tubular     junction to the innominate artery. 3. Right atrial maze procedure for ablation of right atrial flutter. 4. Exposure of right axillary artery for arterial cannulation if     needed.  SURGEON:  [name], M.D.  ASSISTANT:  John Giovanni, P.A.-C.  ANESTHESIA:  General.  PREOPERATIVE DIAGNOSES: 1. 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 2. 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 3. History of atrial flutter, chronic.  POSTOPERATIVE DIAGNOSES: 1. 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 2. 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 3. History of atrial flutter, chronic.  CLINICAL NOTE:  The patient is a 54 year old Afro-American gentleman, who presented to the hospital with heart failure from severe aortic insufficiency and severe LV dysfunction.  He had severe pulmonary hypertension by right heart catheterizations with insignificant coronary artery disease.  He had no significant mitral regurgitation on echo, but he did have a bicuspid aortic valve with severe AI and some element of aortic stenosis as well.  Surgical  AVR was recommended as well as replacement of his ascending aorta and maze procedure for treatment of his atrial flutter.  Prior to  surgery, I reviewed the patient's echoes, cardiac cath, right heart cath, CT scan, and his previous medical records.  I discussed the indications and benefits as well as the risks of AVR, replacement of ascending aorta, and right atrial maze procedure for treatment of his severe AI, ascending aneurysm, and chronic right atrial flutter.  I discussed with the patient major aspects of the operation including the use of general anesthesia and cardiopulmonary bypass, the location of the surgical incisions, and the expected postoperative recovery.  I discussed with the patient,  the possibility of the requirement of hypothermic circulatory arrest for the distal graft anastomosis to replace the ascending aorta.  I reviewed the risks of with of this operation including risks of MI, stroke, bleeding, blood transfusion requirement, postoperative pulmonary problems including pleural effusion, postoperative arrhythmias including heart block requiring permanent pacemaker, and death.  After reviewing these issues, he demonstrated his understanding and agreed to proceed with surgery, and what I felt was an informed consent.  OPERATIVE FINDINGS: 1. Severe dilatation of the LV. 2. Barely dysplastic and insufficient aortic insufficiency. 3. Successful replacement of the ascending aorta without using     circulatory arrest with a distal anastomosis just beneath the     crossclamp. 4.  Successful arterial cannulation of the mid arch with  exposure of     the right axillary artery attempted in order to provide good     arterial access if the aortic arch cannulation was unsuccessful.  DESCRIPTION OF PROCEDURE:  The patient was brought to the operating room and placed supine on the operating room table, where general anesthesia was induced.  Invasive monitoring lines were placed.  Transesophageal echo probe was placed by the anesthesiologist.  This confirmed the preoperative diagnoses.  The patient was  prepped and draped as a sterile field.  A proper time-out was performed.  An axillary artery incision was made to expose the axillary artery for possible arterial inflow for bypass.  The  patient's deep chest and the location of the dilated subclavian venous plexus around artery made difficult exposure.  It was decided to leave the incision open for a possible later use if the aortic arch cannulation became problematic.  A sternotomy was then performed and the pericardium was opened and suspended.  The heart was severely dilated and  hypocontractile.  The aortic arch distal to the innominate artery was prepared in a pursestring was placed.  The pursestring did not significantly bleed, and the patient was given heparin and the aortic arch was cannulated for arterial inflow successfully.  Bicaval venous cannulas were placed with the caval tapes and a left ventricular vent was placed via the right superior pulmonary vein.  Cardioplegia cannula was replaced for antegrade and  retrograde cold blood cardioplegia, and the patient was cooled to 28 degrees.  The aortic crossclamp was applied.  One liter of cold blood cardioplegia was delivered in split doses between the antegrade aortic and retrograde coronary sinus catheters.  There was good cardioplegic arrest and septal temperature dropped less than 15 degrees.  Cardioplegia was delivered every 20 minutes or less while the crossclamp was in place.  The aorta was transected just above the sino-tubular junction.  The aortic valve was inspected.  It was  severely dysplastic, bicuspid, and deformed.  It was excised.  The anulus was debrided of significant calcium.  The anulus was sized to a 27 mm Magna Ease valve.  2-0 Ethibond sutures were placed around the anulus in the subannular position.  The valve was prepared according to protocol.  The sutures were placed through the sewing ring and the valve was seated and sutures were tied.  The valve  conformed to the annulus well without evidence of perivalvular space and the coronary ostia were widely patent.  Next, the aorta was transected beneath the crossclamp.  A 30 mm Hemashield straight graft was selected.  The proximal anastomosis was performed with a running 4-0 Prolene around the posterior aspect of the anastomosis.  This was then reinforced with interrupted 4-0 pledgeted Prolene interrupted sutures around the posterior circumference of the anastomosis.  The anterior anastomosis was then completed with a running 4-0 Prolene  followed by several interrupted 4-0 pledgeted Prolene sutures on the anterior aspect of the proximal anastomosis.  This was then covered with a thin layer of CoSeal.  Cardioplegia was redosed.  Next, the graft was trimmed to the appropriate length and beveled to the ascending aorta just beneath the  crossclamp.  This outflow anastomosis was constructed in the same fashion with first a running 4-0 Prolene around the inferior portion of the anastomosis, which was reinforced with interrupted 4-0 pledgeted Prolenes on the inside of the anastomosis.  After completing the anterior anastomosis with a running suture, the  anterior anastomosis was also reinforced with several pledgeted 4-0 Prolene sutures on the outside of the anterior anastomosis.  A bent needle was placed in the ascending aortic graft and cardioplegia was redosed.  Attention was then directed to the right atrial maze.  A right atriotomy was performed.  The radiofrequency ablation unipolar device was used to place 2 ablation lines through the right atrial isthmus from the IVC up to the tricuspid annulus along the side of the coronary sinus.  Bipolar ablation lines were placed from the inferior aspect of the right atrial incision towards the SVC and for the IVC.  The atriotomy was then closed with running 4-0 Prolene in 2 layers.  De-airing maneuvers were used and the crossclamp was removed  with the patient's head in the steep Trendelenburg position.  The patient was rewarmed and reperfused.  The aortic graft  suture lines were inspected and found to be hemostatic.  Temporary pacing wires were applied.  The lungs were expanded and ventilator was resumed.  When the patient was adequately reperfused and rewarmed, he was weaned from cardiopulmonary bypass with low-dose milrinone and dopamine.  Cardiac output and blood pressure were stable.  Cardiac output was 5 L.  Echo showed normal functioning of aortic valve with improved global LV function, although  still somewhat depressed.  Protamine was administered without adverse reaction.  The cannulas were removed.  The patient still had diffuse coagulopathy.  Extensive tedious efforts were made to obtain hemostasis from the  mediastinal fat as well as from the RV outflow tract, which was adherent to the ascending aneurysm which had been resected.  Finally after giving platelets and FFP as well as cryo to reverse coagulopathy and a low platelet count, a dose of factor VII was administered, which resulted in good hemostasis.  The superior pericardial fat was closed over the aorta.  The anterior and posterior mediastinal tubes were placed and brought through separate incisions. The sternum was closed with interrupted steel wire.  The pectoralis fascia was closed in running #1 Vicryl.  The subcutaneous and skin layers were closed in running Vicryl.  The right axillary incision was drained with a JP drain.  Hemostasis was obtained and was closed in layers using interrupted Vicryl for the muscle and a running 2-0 Vicryl for the subcutaneous layer and skin staples for the skin.  The patient was then returned to the ICU in stable condition.  Total cardiopulmonary bypass time was 200 minutes.     [name], M.D.     PV/MEDQ  D:  04/07/2015  T:  04/08/2015  Job:  [id number]  cc:   Darlin Coco, M.D.

## 2015-04-08 NOTE — Evaluation (Signed)
Physical Therapy Evaluation Patient Details Name: [name] MRN: [id number] DOB: March 01, 1961 Today's Date: 04/08/2015   History of Present Illness  [name] is a 54 yo man with PMH of hypertension, atrial flutter, mild AS, moderate AI and nonischemic cardiomyopathy with MUGA EF ~ 35% after LHC 15-20% after being hospitalized several times for CHF having 12/11-12/12/15 admission for RHC/LHC. He presents today with 2 weeks of progressive shortness of breath. No infectious symptoms - no fever/chills/nausea/vomiting/diarrhea. He has 20 feet dyspnea on exertion. He tells me he sleeps on 8 pillows/recliner. He wakes up SOB from sleep. He received IV lasix in the ER. ECG noted to be in atrial flutter with BNP 2700 and Trop 0.1. No current chest pain.   Clinical Impression  Pt admitted with/for AVR and aorta replacement.  Pt currently limited functionally due to the problems listed. ( See problems list.)   Pt will benefit from PT to maximize function and safety in order to get ready for next venue listed below.     Follow Up Recommendations SNF;Other (comment) (pt reports that is where he will be going)    Equipment Recommendations  Other (comment) (TBA)    Recommendations for Other Services       Precautions / Restrictions Precautions Precautions: Fall      Mobility  Bed Mobility               General bed mobility comments: OOB in recliner  Transfers Overall transfer level: Needs assistance Equipment used: Rolling walker (2 wheeled) Transfers: Sit to/from Stand Sit to Stand: Min assist;+2 safety/equipment         General transfer comment: assist to come forward more than to power up.  Ambulation/Gait Ambulation/Gait assistance: Min guard Ambulation Distance (Feet): 200 Feet Assistive device: Rolling walker (2 wheeled) Gait Pattern/deviations: Step-through pattern;Trunk flexed Gait velocity: moderate Gait velocity interpretation: at or above normal speed  for age/gender General Gait Details: generally steady, but speed/stability control issues with use of RW.  Stairs            Wheelchair Mobility    Modified Rankin (Stroke Patients Only)       Balance Overall balance assessment: Needs assistance Sitting-balance support: No upper extremity supported Sitting balance-Leahy Scale: Fair     Standing balance support: No upper extremity supported;Single extremity supported Standing balance-Leahy Scale: Fair                               Pertinent Vitals/Pain Pain Assessment: Faces Faces Pain Scale: Hurts little more Pain Location: chest Pain Descriptors / Indicators: Grimacing Pain Intervention(s): Monitored during session;Repositioned;Other (comment) ("heart" pillow for coughing)    Home Living Family/patient expects to be discharged to:: Skilled nursing facility Living Arrangements: Non-relatives/Friends                    Prior Function Level of Independence: Independent               Hand Dominance        Extremity/Trunk Assessment   Upper Extremity Assessment: Overall WFL for tasks assessed           Lower Extremity Assessment: Overall WFL for tasks assessed (mildly weakened)         Communication   Communication: No difficulties;Other (comment) (mumbles)  Cognition Arousal/Alertness: Awake/alert Behavior During Therapy: Flat affect Overall Cognitive Status: Within Functional Limits for tasks assessed  General Comments General comments (skin integrity, edema, etc.): resting HR  88 bpm up to 94 in standing.  EHR in the upper 90's,  SpO2 stable at 94/95% on 2 L.    Exercises        Assessment/Plan    PT Assessment Patient needs continued PT services  PT Diagnosis Generalized weakness;Acute pain   PT Problem List Decreased strength;Decreased activity tolerance;Decreased mobility;Decreased knowledge of use of DME;Cardiopulmonary status  limiting activity;Pain  PT Treatment Interventions DME instruction;Gait training;Functional mobility training;Therapeutic activities;Patient/family education   PT Goals (Current goals can be found in the Care Plan section) Acute Rehab PT Goals Patient Stated Goal: Get back home PT Goal Formulation: With patient Time For Goal Achievement: 04/22/15 Potential to Achieve Goals: Good    Frequency Min 3X/week   Barriers to discharge        Co-evaluation               End of Session Equipment Utilized During Treatment: Oxygen Activity Tolerance: Patient tolerated treatment well Patient left: in chair;with call bell/phone within reach Nurse Communication: Mobility status         Time: 1540-0867 PT Time Calculation (min) (ACUTE ONLY): 25 min   Charges:   PT Evaluation $Initial PT Evaluation Tier I: 1 Procedure PT Treatments $Gait Training: 8-22 mins   PT G Codes:        Sherlon Nied, Tessie Fass 04/08/2015, 12:38 PM 04/08/2015  Donnella Sham, PT [phone]  (pager)

## 2015-04-08 NOTE — Progress Notes (Signed)
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 and right atrial maze procedure for chronic atrial flutter  Mental status is improved inpatient strength is overall improving The patient remains very hoarse and unable to swallow safely-followed by speech therapy Maintaining probable junctional rhythm on amiodarone, diuresing well Wet cough with possible aspiration-covered by [name]  Objective: Vital signs in last 24 hours: Temp:  [97.9 F (36.6 C)-99.3 F (37.4 C)] 98.1 F (36.7 C) (04/21 1539) Pulse Rate:  [75-93] 87 (04/21 1000) Cardiac Rhythm:  [-] Junctional rhythm (04/21 0940) Resp:  [19-29] 27 (04/21 1000) BP: (127-168)/(56-126) 129/79 mmHg (04/21 1000) SpO2:  [92 %-99 %] 98 % (04/21 1416) Weight:  [234 lb 9.1 oz (106.4 kg)] 234 lb 9.1 oz (106.4 kg) (04/21 0500)  Hemodynamic parameters for last 24 hours:  stable  Intake/Output from previous day: 04/20 0701 - 04/21 0700 In: 7974.4 [I.V.:7824.4; IV Piggyback:150] Out: 2482 [Urine:1430; Drains:20; Chest Tube:280] Intake/Output this shift: Total I/O In: 429 [I.V.:329; IV Piggyback:100] Out: 520 [Urine:490; Chest Tube:30]  Up in chair Scattered rhonchi Extremities warm No focal motor deficit  Lab Results:  Recent Labs  04/06/15 1615  04/07/15 0421 04/07/15 1528 04/08/15 1651  WBC 15.1*  --  18.2*  --   --   HGB 10.5*  < > 10.5* 14.3 10.2*  HCT 31.1*  < > 31.7* 42.0 30.0*  PLT 147*  --  165  --   --   < > = values in this interval not displayed. BMET:  Recent Labs  04/07/15 0421  04/08/15 0420 04/08/15 1651  NA 136  < > 137 139  K 4.6  < > 4.2 3.8   CL 103  < > 101 101  CO2 24  --  25  --   GLUCOSE 153*  < > 146* 141*  BUN 21  < > 28* 28*  CREATININE 1.51*  < > 1.46* 1.30  CALCIUM 8.6  --  8.8  --   < > = values in this interval not displayed.  PT/INR:  Recent Labs  04/05/15 1740  LABPROT 12.1  INR 0.89   ABG    Component Value Date/Time   PHART 7.403 04/07/2015 1524   HCO3 24.0 04/07/2015 1524   TCO2 23 04/08/2015 1651   ACIDBASEDEF 1.0 04/07/2015 1524   O2SAT 69.1 04/08/2015 0500   CBG (last 3)   Recent Labs  04/08/15 0757 04/08/15 1214 04/08/15 1538  GLUCAP 135* 132* 122*    Assessment/Plan: S/P Procedure(s) (LRB): AORTIC VALVE REPLACEMENT (AVR) using a 33mm Edwards Aortic Magna Ease Valve  (N/A) TRANSESOPHAGEAL ECHOCARDIOGRAM (TEE) (N/A) MAZE (N/A) REPLACEMENT ASCENDING AORTA with a 39mm Hemashield Platinum Graft (N/A) Mobilize Diuresis Diabetes control d/c tubes/lines   LOS: 12 days    [name] 04/08/2015

## 2015-04-08 NOTE — Clinical Social Work Note (Signed)
CSW received consult for SNF placement but did not have time to assess patient will try tomorrow.  Jones Broom. [place], MSW, [place] 04/08/2015 5:54 PM

## 2015-04-08 NOTE — Progress Notes (Signed)
    Stable post op AVR, MAZE, Aorta graft Difficult on tele to see underlying P waves ECG on 4/20 with intermittent P waves present. May be competing junctional rhythm. Continue to monitor.   Candee Furbish, MD

## 2015-04-09 ENCOUNTER — Inpatient Hospital Stay (HOSPITAL_COMMUNITY): Payer: Medicaid Other

## 2015-04-09 DIAGNOSIS — I5023 Acute on chronic systolic (congestive) heart failure: Principal | ICD-10-CM

## 2015-04-09 LAB — COMPREHENSIVE METABOLIC PANEL
ALT: 46 U/L (ref 0–53)
AST: 39 U/L — ABNORMAL HIGH (ref 0–37)
Albumin: 2.8 g/dL — ABNORMAL LOW (ref 3.5–5.2)
Alkaline Phosphatase: 71 U/L (ref 39–117)
Anion gap: 10 (ref 5–15)
BUN: 29 mg/dL — ABNORMAL HIGH (ref 6–23)
CO2: 27 mmol/L (ref 19–32)
Calcium: 8.9 mg/dL (ref 8.4–10.5)
Chloride: 102 mmol/L (ref 96–112)
Creatinine, Ser: 1.41 mg/dL — ABNORMAL HIGH (ref 0.50–1.35)
GFR calc Af Amer: 64 mL/min — ABNORMAL LOW (ref >90)
GFR calc non Af Amer: 55 mL/min — ABNORMAL LOW (ref >90)
Glucose, Bld: 133 mg/dL — ABNORMAL HIGH (ref 70–99)
Potassium: 3.6 mmol/L (ref 3.5–5.1)
Sodium: 139 mmol/L (ref 135–145)
Total Bilirubin: 5.7 mg/dL — ABNORMAL HIGH (ref 0.3–1.2)
Total Protein: 5.9 g/dL — ABNORMAL LOW (ref 6.0–8.3)

## 2015-04-09 LAB — GLUCOSE, CAPILLARY
GLUCOSE-CAPILLARY: 121 mg/dL — AB (ref 70–99)
GLUCOSE-CAPILLARY: 113 mg/dL — AB (ref 70–99)
Glucose-Capillary: 112 mg/dL — ABNORMAL HIGH (ref 70–99)
Glucose-Capillary: 115 mg/dL — ABNORMAL HIGH (ref 70–99)
Glucose-Capillary: 122 mg/dL — ABNORMAL HIGH (ref 70–99)
Glucose-Capillary: 125 mg/dL — ABNORMAL HIGH (ref 70–99)

## 2015-04-09 LAB — CBC
HCT: 27.6 % — ABNORMAL LOW (ref 39.0–52.0)
Hemoglobin: 9.5 g/dL — ABNORMAL LOW (ref 13.0–17.0)
MCH: 27.9 pg (ref 26.0–34.0)
MCHC: 34.4 g/dL (ref 30.0–36.0)
MCV: 80.9 fL (ref 78.0–100.0)
Platelets: 193 10*3/uL (ref 150–400)
RBC: 3.41 MIL/uL — ABNORMAL LOW (ref 4.22–5.81)
RDW: 15.3 % (ref 11.5–15.5)
WBC: 14 10*3/uL — ABNORMAL HIGH (ref 4.0–10.5)

## 2015-04-09 LAB — URINALYSIS, ROUTINE W REFLEX MICROSCOPIC
Bilirubin Urine: NEGATIVE
Glucose, UA: NEGATIVE mg/dL
Hgb urine dipstick: NEGATIVE
Ketones, ur: NEGATIVE mg/dL
Leukocytes, UA: NEGATIVE
Nitrite: NEGATIVE
Protein, ur: NEGATIVE mg/dL
Specific Gravity, Urine: 1.01 (ref 1.005–1.030)
Urobilinogen, UA: 0.2 mg/dL (ref 0.0–1.0)
pH: 5 (ref 5.0–8.0)

## 2015-04-09 LAB — CARBOXYHEMOGLOBIN
Carboxyhemoglobin: 0.9 % (ref 0.5–1.5)
Methemoglobin: 0.8 % (ref 0.0–1.5)
O2 Saturation: 64.9 %
Total hemoglobin: 13.2 g/dL — ABNORMAL LOW (ref 13.5–18.0)

## 2015-04-09 MED ORDER — POTASSIUM CHLORIDE 10 MEQ/50ML IV SOLN
10.0000 meq | INTRAVENOUS | Status: AC
  Administered 2015-04-09 (×3): 10 meq via INTRAVENOUS
  Filled 2015-04-09 (×3): qty 50

## 2015-04-09 MED ORDER — BUDESONIDE 0.5 MG/2ML IN SUSP
0.5000 mg | Freq: Two times a day (BID) | RESPIRATORY_TRACT | Status: DC
  Administered 2015-04-09 – 2015-04-23 (×26): 0.5 mg via RESPIRATORY_TRACT
  Filled 2015-04-09 (×31): qty 2

## 2015-04-09 MED ORDER — ENOXAPARIN SODIUM 40 MG/0.4ML ~~LOC~~ SOLN
40.0000 mg | SUBCUTANEOUS | Status: DC
  Administered 2015-04-09 – 2015-04-22 (×14): 40 mg via SUBCUTANEOUS
  Filled 2015-04-09 (×15): qty 0.4

## 2015-04-09 NOTE — Progress Notes (Signed)
10mg  Hydralazine given for elevated BP

## 2015-04-09 NOTE — Progress Notes (Signed)
Pt requesting ice chips.  Per report from day shift RN pt can have 1 teaspoon of ice chips per hour.  Per day shift RN last given at 1900.  Advised pt can have again at 2000.  Pt belligerent and wanting me to call Dr. Prescott Gum at home regarding having ice chips.  Advised pt importance of limit ice chip do to potential aspiration.  Pt appeared annoyed with this RN and information provided.  Will continue to monitor pt.

## 2015-04-09 NOTE — Progress Notes (Signed)
UR Completed.  [phone]  

## 2015-04-09 NOTE — Progress Notes (Signed)
4 Days Post-Op Procedure(s) (LRB): AORTIC VALVE REPLACEMENT (AVR) using a 49mm Edwards Aortic Magna Ease Valve  (N/A) TRANSESOPHAGEAL ECHOCARDIOGRAM (TEE) (N/A) MAZE (N/A) REPLACEMENT ASCENDING AORTA with a 18mm Hemashield Platinum Graft (N/A) Subjective: Wet cough continues with increased oxygen demand Repeat swallow evaluation shows some improvement Maintaining sinus rhythm-amiodarone discontinued  Objective: Vital signs in last 24 hours: Temp:  [97.9 F (36.6 C)-98.5 F (36.9 C)] 98.3 F (36.8 C) (04/22 1615) Pulse Rate:  [54-88] 82 (04/22 2010) Cardiac Rhythm:  [-] Junctional rhythm (04/22 1600) Resp:  [16-29] 25 (04/22 2010) BP: (120-189)/(61-138) 167/121 mmHg (04/22 2010) SpO2:  [94 %-100 %] 98 % (04/22 2010) Weight:  [227 lb 1.2 oz (103 kg)] 227 lb 1.2 oz (103 kg) (04/22 0600)  Hemodynamic parameters for last 24 hours:   stable  Intake/Output from previous day: 04/21 0701 - 04/22 0700 In: 1389.2 [I.V.:1089.2; IV Piggyback:300] Out: 4650 [Urine:1305; Chest Tube:30] Intake/Output this shift: Total I/O In: 19.1 [I.V.:19.1] Out: 150 [Urine:150]  Bilateral rhonchi improved Hoarseness improved Lab Results:  Recent Labs  04/07/15 0421  04/08/15 1651 04/09/15 0315  WBC 18.2*  --   --  14.0*  HGB 10.5*  < > 10.2* 9.5*  HCT 31.7*  < > 30.0* 27.6*  PLT 165  --   --  193  < > = values in this interval not displayed. BMET:  Recent Labs  04/08/15 0420 04/08/15 1651 04/09/15 0315  NA 137 139 139  K 4.2 3.8 3.6  CL 101 101 102  CO2 25  --  27  GLUCOSE 146* 141* 133*  BUN 28* 28* 29*  CREATININE 1.46* 1.30 1.41*  CALCIUM 8.8  --  8.9    PT/INR: No results for input(s): LABPROT, INR in the last 72 hours. ABG    Component Value Date/Time   PHART 7.403 04/07/2015 1524   HCO3 24.0 04/07/2015 1524   TCO2 23 04/08/2015 1651   ACIDBASEDEF 1.0 04/07/2015 1524   O2SAT 64.9 04/09/2015 0325   CBG (last 3)   Recent Labs  04/09/15 1229 04/09/15 1612  04/09/15 1947  GLUCAP 113* 115* 122*    Assessment/Plan: S/P Procedure(s) (LRB): AORTIC VALVE REPLACEMENT (AVR) using a 3mm Edwards Aortic Magna Ease Valve  (N/A) TRANSESOPHAGEAL ECHOCARDIOGRAM (TEE) (N/A) MAZE (N/A) REPLACEMENT ASCENDING AORTA with a 43mm Hemashield Platinum Graft (N/A) Wean milrinone Continue pulmonary toilet and antibiotics for probable aspiration Stop amiodarone   LOS: 13 days    [name] 04/09/2015

## 2015-04-09 NOTE — Progress Notes (Signed)
Bed linens changed with family at bedside.  Pt prefers his wife to complete his bath.  Bath linens provided to wife and this RN offered her assistance.  Pt wife advised she would call for assistance as needed.  Bed linens changed while family at bedside.

## 2015-04-09 NOTE — Clinical Social Work Placement (Signed)
   CLINICAL SOCIAL WORK PLACEMENT  NOTE  Date:  04/09/2015  Patient Details  Name: [name] MRN: [id number] Date of Birth: 1961/12/01  Clinical Social Work is seeking post-discharge placement for this patient at the [place] level of care (*CSW will initial, date and re-position this form in  chart as items are completed):  Yes   Patient/family provided with [facility] Work Department's list of facilities offering this level of care within the geographic area requested by the patient (or if unable, by the patient's family).  Yes   Patient/family informed of their freedom to choose among providers that offer the needed level of care, that participate in Medicare, Medicaid or managed care program needed by the patient, have an available bed and are willing to accept the patient.  Yes   Patient/family informed of 's ownership interest in [facility] and [facility], as well as of the fact that they are under no obligation to receive care at these facilities.  PASRR submitted to EDS on 04/09/15     PASRR number received on       Existing PASRR number confirmed on 04/09/15     FL2 transmitted to all facilities in geographic area requested by pt/family on       FL2 transmitted to all facilities within larger geographic area on 04/09/15     Patient informed that his/her managed care company has contracts with or will negotiate with certain facilities, including the following:            Patient/family informed of bed offers received.  Patient chooses bed at       Physician recommends and patient chooses bed at      Patient to be transferred to   on  .  Patient to be transferred to facility by       Patient family notified on   of transfer.  Name of family member notified:        PHYSICIAN Please sign FL2     Additional Comment:    _______________________________________________ Ross Ludwig, LCSWA 04/09/2015,  5:40 PM

## 2015-04-09 NOTE — Progress Notes (Signed)
Speech Language Pathology Treatment: Dysphagia  Patient Details Name: [name] MRN: [id number] DOB: 1961-09-05 Today's Date: 04/09/2015 Time: 0177-9390 SLP Time Calculation (min) (ACUTE ONLY): 30 min  Assessment / Plan / Recommendation Clinical Impression  Pt with improved phonation today; demonstrating persisting s/s of dysphagia with aspiration risk, particularly with thin liquids, but today he is able to tolerate limited amounts of purees with only mild difficulty transitioning through pharynx per pt report.  Dtr and wife present. Recommend proceeding with FEES this afternoon to determine safest oral alimentation.  Educated pt and family; they agree with plan.  FEES today 1330.    HPI HPI:  54 y.o. man underwent aortic valve replacment 4/18.  Pt was intubated for surgery and extubated within 24 hours,,on 4/19.  RN reports pt was groggy and slow to respond after extubation, but is more alert today.  PMH:  obesity, HTN, sliding Hiatal Hernia with reflux.   Pertinent Vitals    SLP Plan  Continue with current plan of care    Recommendations Diet recommendations: NPO              Oral Care Recommendations: Oral care BID;Oral care prior to ice chips Follow up Recommendations: Inpatient Rehab Plan: Continue with current plan of care   [name] L. Tivis Ringer, [place] CCC/SLP Pager [phone]      [name] 04/09/2015, 12:12 PM

## 2015-04-09 NOTE — Procedures (Signed)
Objective Swallowing Evaluation: Fiberoptic Endoscopic Evaluation of Swallowing  Patient Details  Name: [name] MRN: [id number] Date of Birth: 1961-07-13  Today's Date: 04/09/2015 Time: SLP Start Time (ACUTE ONLY): 1445-SLP Stop Time (ACUTE ONLY): 1545 SLP Time Calculation (min) (ACUTE ONLY): 60 min  Past Medical History:  Past Medical History  Diagnosis Date  . Insomnia   . CHF (congestive heart failure)   . Congenital insufficiency of aortic valve   . Congestive cardiomyopathy   . Atrial flutter   . Benign hypertensive heart and renal disease   . Essential hypertension, malignant   . SOB (shortness of breath)   . Murmur   . Aortic stenosis 11/19/2014  . 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 Benign essential HTN 11/19/2014  . Chronic systolic CHF (congestive heart failure) 11/19/2014   Past Surgical History:  Past Surgical History  Procedure Laterality Date  . Left and right heart catheterization with coronary angiogram N/A 11/27/2014    Procedure: LEFT AND RIGHT HEART CATHETERIZATION WITH CORONARY ANGIOGRAM;  Surgeon: [name], MD;  Location: [facility] CATH [facility];  Service: Cardiovascular;  Laterality: N/A;  . Aortic valve replacement N/A 04/05/2015    Procedure: AORTIC VALVE REPLACEMENT (AVR) using a 22mm Edwards Aortic Magna Ease Valve ;  Surgeon: [name], MD;  Location: [place];  Service: Open Heart Surgery;  Laterality: N/A;  . Tee without cardioversion N/A 04/05/2015    Procedure: TRANSESOPHAGEAL ECHOCARDIOGRAM (TEE);  Surgeon: [name], MD;  Location: [place];  Service: Open Heart Surgery;  Laterality: N/A;  . Maze N/A 04/05/2015    Procedure: MAZE;  Surgeon: [name], MD;  Location: [place];  Service: Open Heart Surgery;  Laterality: N/A;  . Replacement ascending aorta N/A 04/05/2015    Procedure: REPLACEMENT ASCENDING AORTA with a 50mm Hemashield Platinum Graft;  Surgeon: [name], MD;  Location: [place];  Service: Open Heart Surgery;   Laterality: N/A;   HPI:  HPI:  54 y.o. man underwent aortic valve replacment 4/18.  Pt was intubated for surgery and extubated within 24 hours,on 4/19.  PMH:  obesity, HTN, sliding Hiatal Hernia with reflux.  Clinical swallow evaluation on 4/20 revealed dysphagia with concerns for aspiration.  Orders for FEES 04/09/15.   No Data Recorded  Assessment / Plan / Recommendation CHL IP CLINICAL IMPRESSIONS 04/09/2015  Dysphagia Diagnosis Moderate pharyngeal phase dysphagia  Clinical impression Pt presents with a moderate pharyngeal dysphagia marked by decreased pharyngolaryngeal mobility, particularly on left side, and inconsistent airway protection leading to penetration of all consistencies into the larynx.  There were diffuse secretions throughout pharynx/larynx noted during study.  Secretions mixed with residuals, and the thinner viscosities eventually spilled over the interarytenoid space to reach the vocal folds.  Large boluses of thin liquid were aspirated.  Purees were least likely to enter the airway, but lined the laryngeal vestibule and were difficult to clear.  A chin tuck exacerbated laryngeal penetration.  Pt coughed in response to aspirate, but not penetrate.  Reviewed video of study with pt and his family.   Pt not safe to begin full oral alimentation.  Recommend continuing NPO status except occasional ice chips and meds crushed in puree.  D/W RN.  SLP will follow.        CHL IP TREATMENT RECOMMENDATION 04/09/2015  Treatment Plan Recommendations Therapy as outlined in treatment plan below     CHL IP DIET RECOMMENDATION 04/09/2015  Diet Recommendations Ice chips PRN after oral care;NPO  Liquid Administration  via (None)  Medication Administration Crushed with puree  Compensations Small sips/bites;Clear throat intermittently  Postural Changes and/or Swallow Maneuvers Seated upright 90 degrees     CHL IP OTHER RECOMMENDATIONS 04/09/2015  Recommended Consults (None)  Oral Care Recommendations  Oral care Q4 per protocol;Oral care prior to ice chips  Other Recommendations (None)     CHL IP FOLLOW UP RECOMMENDATIONS 04/09/2015  Follow up Recommendations Inpatient Rehab     CHL IP FREQUENCY AND DURATION 04/09/2015  Speech Therapy Frequency (ACUTE ONLY) min 3x week  Treatment Duration 2 weeks         SLP Swallow Goals No flowsheet data found.  No flowsheet data found.    CHL IP REASON FOR REFERRAL 04/09/2015  Reason for Referral Objectively evaluate swallowing function     CHL IP ORAL PHASE 04/09/2015  Lips (None)  Tongue (None)  Mucous membranes (None)  Nutritional status (None)  Other (None)  Oxygen therapy (None)  Oral Phase WFL  Oral - Pudding Teaspoon (None)  Oral - Pudding Cup (None)  Oral - Honey Teaspoon (None)  Oral - Honey Cup (None)  Oral - Honey Syringe (None)  Oral - Nectar Teaspoon (None)  Oral - Nectar Cup (None)  Oral - Nectar Straw (None)  Oral - Nectar Syringe (None)  Oral - Ice Chips (None)  Oral - Thin Teaspoon (None)  Oral - Thin Cup (None)  Oral - Thin Straw (None)  Oral - Thin Syringe (None)  Oral - Puree (None)  Oral - Mechanical Soft (None)  Oral - Regular (None)  Oral - Multi-consistency (None)  Oral - Pill (None)  Oral Phase - Comment (None)      CHL IP PHARYNGEAL PHASE 04/09/2015  Pharyngeal Phase Impaired  Pharyngeal - Pudding Teaspoon (None)  Penetration/Aspiration details (pudding teaspoon) (None)  Pharyngeal - Pudding Cup (None)  Penetration/Aspiration details (pudding cup) (None)  Pharyngeal - Honey Teaspoon Reduced pharyngeal peristalsis;Reduced laryngeal elevation;Reduced airway/laryngeal closure;Penetration/Aspiration during swallow;Pharyngeal residue - pyriform sinuses;Pharyngeal residue - cp segment;Inter-arytenoid space residue;Pharyngeal residue - posterior pharnyx  Penetration/Aspiration details (honey teaspoon) Material enters airway, remains ABOVE vocal cords and not ejected out  Pharyngeal - Honey Cup (None)   Penetration/Aspiration details (honey cup) (None)  Pharyngeal - Honey Syringe (None)  Penetration/Aspiration details (honey syringe) (None)  Pharyngeal - Nectar Teaspoon Reduced pharyngeal peristalsis;Reduced laryngeal elevation;Reduced airway/laryngeal closure;Penetration/Aspiration during swallow;Pharyngeal residue - pyriform sinuses;Pharyngeal residue - cp segment;Inter-arytenoid space residue;Pharyngeal residue - posterior pharnyx  Penetration/Aspiration details (nectar teaspoon) Material enters airway, remains ABOVE vocal cords and not ejected out  Pharyngeal - Nectar Cup (None)  Penetration/Aspiration details (nectar cup) (None)  Pharyngeal - Nectar Straw (None)  Penetration/Aspiration details (nectar straw) (None)  Pharyngeal - Nectar Syringe (None)  Penetration/Aspiration details (nectar syringe) (None)  Pharyngeal - Ice Chips Reduced laryngeal elevation;Reduced airway/laryngeal closure;Pharyngeal residue - pyriform sinuses;Pharyngeal residue - cp segment;Inter-arytenoid space residue  Penetration/Aspiration details (ice chips) (None)  Pharyngeal - Thin Teaspoon Reduced pharyngeal peristalsis;Reduced laryngeal elevation;Reduced airway/laryngeal closure;Penetration/Aspiration during swallow;Pharyngeal residue - pyriform sinuses;Pharyngeal residue - cp segment;Inter-arytenoid space residue  Penetration/Aspiration details (thin teaspoon) Material enters airway, CONTACTS cords and not ejected out  Pharyngeal - Thin Cup (None)  Penetration/Aspiration details (thin cup) (None)  Pharyngeal - Thin Straw Reduced pharyngeal peristalsis;Reduced laryngeal elevation;Reduced airway/laryngeal closure;Penetration/Aspiration during swallow;Pharyngeal residue - pyriform sinuses;Pharyngeal residue - cp segment;Inter-arytenoid space residue;Trace aspiration  Penetration/Aspiration details (thin straw) Material enters airway, passes BELOW cords and not ejected out despite cough attempt by patient  Pharyngeal  - Thin Syringe (None)  Penetration/Aspiration  details (thin syringe') (None)  Pharyngeal - Puree Reduced pharyngeal peristalsis;Reduced laryngeal elevation;Reduced airway/laryngeal closure;Penetration/Aspiration during swallow;Pharyngeal residue - pyriform sinuses;Pharyngeal residue - cp segment;Inter-arytenoid space residue;Pharyngeal residue - posterior pharnyx  Penetration/Aspiration details (puree) Material enters airway, remains ABOVE vocal cords and not ejected out  Pharyngeal - Mechanical Soft (None)  Penetration/Aspiration details (mechanical soft) (None)  Pharyngeal - Regular (None)  Penetration/Aspiration details (regular) (None)  Pharyngeal - Multi-consistency (None)  Penetration/Aspiration details (multi-consistency) (None)  Pharyngeal - Pill (None)  Penetration/Aspiration details (pill) (None)  Pharyngeal Comment (None)     No flowsheet data found.  No flowsheet data found.         Juan Quam Laurice 04/09/2015, 4:12 PM

## 2015-04-09 NOTE — Clinical Social Work Note (Signed)
Clinical Social Work Assessment  Patient Details  Name: [name] MRN: [id number] Date of Birth: April 05, 1961  Date of referral:  04/08/15               Reason for consult:  Facility Placement                Permission sought to share information with:  Family Supports, Chartered certified accountant granted to share information::  Yes, Verbal Permission Granted  Name::     Tyrek Lawhorn   Agency::     Relationship::   Sister of patient  Contact Information:     Housing/Transportation Living arrangements for the past 2 months:  Single Family Home Source of Information:  Patient, Adult Children, Other (Comment Required) (Sister) Patient Interpreter Needed:  None Criminal Activity/Legal Involvement Pertinent to Current Situation/Hospitalization:  No - Comment as needed Significant Relationships:  Siblings, Adult Children, Other Family Members Lives with:  Adult Children, Relatives Do you feel safe going back to the place where you live?  Yes Need for family participation in patient care:  Yes (Comment) (Patient request that his sister Phi Avans be spoken to to make decisions with him.)  Care giving concerns:  Patient lives with his adult children.   Social Worker assessment / plan:  Patient is a pleasant 54 year old male who is alert and oriented x4.  Patient had family at bedside and they provide good support for patient.  Patient and his family are aware that he will need to go to a SNF for some short term rehab to increase his strength.  CSW explained to patient and his family that he may have limited choices due to only having Medicaid.  CSW explained the process of faxing out a patient and trying to find short term rehab placement.  Patient is able to communicate his wishes and asked good questions about placement.  CSW spoke with family and they were aware that patient may not be able to go where they would like him to, but they just want him to get  better so he can return home.  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  Patient did not have any other questions and expressed that he understood the process of going to SNF then getting home health once he discharges from SNF to home.  Employment status:  Therapist, music:  Medicaid In [place] PT Recommendations:  [place] / Referral to community resources:  [place]  Patient/Family's Response to care:  Patient and family are in agreement to going to SNF for short term rehab.  Patient/Family's Understanding of and Emotional Response to Diagnosis, Current Treatment, and Prognosis:  Patient and his family expressed they are happy he is doing better and are looking forward to him getting better.  Emotional Assessment Appearance:  Appears stated age Attitude/Demeanor/Rapport:   (Pleasant to talk to ) Affect (typically observed):  Accepting, Appropriate, Hopeful, Pleasant Orientation:  Oriented to Self, Oriented to Place, Oriented to  Time, Oriented to Situation Alcohol / Substance use:  Not Applicable Psych involvement (Current and /or in the community):  No (Comment)  Discharge Needs  Concerns to be addressed:  No discharge needs identified Readmission within the last 30 days:  No Current discharge risk:  None Barriers to Discharge:  Other (Patient only has Medicaid)   Jones Broom. Norval Morton, MSW, [place] 04/09/2015 5:40  PM

## 2015-04-10 ENCOUNTER — Inpatient Hospital Stay (HOSPITAL_COMMUNITY): Payer: Medicaid Other

## 2015-04-10 LAB — COMPREHENSIVE METABOLIC PANEL
ALT: 47 U/L (ref 0–53)
AST: 35 U/L (ref 0–37)
Albumin: 2.8 g/dL — ABNORMAL LOW (ref 3.5–5.2)
Alkaline Phosphatase: 92 U/L (ref 39–117)
Anion gap: 12 (ref 5–15)
BUN: 30 mg/dL — ABNORMAL HIGH (ref 6–23)
CO2: 25 mmol/L (ref 19–32)
Calcium: 8.8 mg/dL (ref 8.4–10.5)
Chloride: 103 mmol/L (ref 96–112)
Creatinine, Ser: 1.41 mg/dL — ABNORMAL HIGH (ref 0.50–1.35)
GFR calc Af Amer: 64 mL/min — ABNORMAL LOW (ref >90)
GFR calc non Af Amer: 55 mL/min — ABNORMAL LOW (ref >90)
Glucose, Bld: 144 mg/dL — ABNORMAL HIGH (ref 70–99)
Potassium: 3.6 mmol/L (ref 3.5–5.1)
Sodium: 140 mmol/L (ref 135–145)
Total Bilirubin: 6.5 mg/dL — ABNORMAL HIGH (ref 0.3–1.2)
Total Protein: 6.3 g/dL (ref 6.0–8.3)

## 2015-04-10 LAB — GLUCOSE, CAPILLARY
Glucose-Capillary: 114 mg/dL — ABNORMAL HIGH (ref 70–99)
Glucose-Capillary: 124 mg/dL — ABNORMAL HIGH (ref 70–99)

## 2015-04-10 LAB — POCT I-STAT, CHEM 8
BUN: 29 mg/dL — ABNORMAL HIGH (ref 6–23)
CHLORIDE: 102 mmol/L (ref 96–112)
CREATININE: 1.2 mg/dL (ref 0.50–1.35)
Calcium, Ion: 1.19 mmol/L (ref 1.12–1.23)
GLUCOSE: 112 mg/dL — AB (ref 70–99)
HCT: 31 % — ABNORMAL LOW (ref 39.0–52.0)
Hemoglobin: 10.5 g/dL — ABNORMAL LOW (ref 13.0–17.0)
POTASSIUM: 3.9 mmol/L (ref 3.5–5.1)
SODIUM: 142 mmol/L (ref 135–145)
TCO2: 25 mmol/L (ref 0–100)

## 2015-04-10 LAB — CBC
HCT: 28.7 % — ABNORMAL LOW (ref 39.0–52.0)
Hemoglobin: 9.6 g/dL — ABNORMAL LOW (ref 13.0–17.0)
MCH: 27 pg (ref 26.0–34.0)
MCHC: 33.4 g/dL (ref 30.0–36.0)
MCV: 80.6 fL (ref 78.0–100.0)
Platelets: 253 10*3/uL (ref 150–400)
RBC: 3.56 MIL/uL — ABNORMAL LOW (ref 4.22–5.81)
RDW: 15.3 % (ref 11.5–15.5)
WBC: 16.2 10*3/uL — ABNORMAL HIGH (ref 4.0–10.5)

## 2015-04-10 MED ORDER — PANTOPRAZOLE SODIUM 40 MG PO TBEC: 40.0000 mg | DELAYED_RELEASE_TABLET | Freq: Every day | ORAL | Status: DC

## 2015-04-10 MED ORDER — HYDRALAZINE HCL 20 MG/ML IJ SOLN
10.0000 mg | Freq: Four times a day (QID) | INTRAMUSCULAR | Status: DC | PRN
  Administered 2015-04-10: 10 mg via INTRAVENOUS
  Filled 2015-04-10: qty 1

## 2015-04-10 MED ORDER — POTASSIUM CHLORIDE 10 MEQ/50ML IV SOLN
10.0000 meq | INTRAVENOUS | Status: AC
  Administered 2015-04-10 (×3): 10 meq via INTRAVENOUS
  Filled 2015-04-10 (×3): qty 50

## 2015-04-10 MED ORDER — MILRINONE IN DEXTROSE 20 MG/100ML IV SOLN
0.1250 ug/kg/min | INTRAVENOUS | Status: DC
  Administered 2015-04-10: 0.125 ug/kg/min via INTRAVENOUS
  Filled 2015-04-10: qty 100

## 2015-04-10 MED ORDER — HYDRALAZINE HCL 20 MG/ML IJ SOLN: 20.0000 mg | Freq: Three times a day (TID) | INTRAMUSCULAR | Status: DC

## 2015-04-10 MED ORDER — PANTOPRAZOLE SODIUM 40 MG IV SOLR
40.0000 mg | INTRAVENOUS | Status: DC
  Administered 2015-04-10 – 2015-04-13 (×4): 40 mg via INTRAVENOUS
  Filled 2015-04-10 (×5): qty 40

## 2015-04-10 MED ORDER — FUROSEMIDE 10 MG/ML IJ SOLN
40.0000 mg | Freq: Every day | INTRAMUSCULAR | Status: DC
  Administered 2015-04-11: 40 mg via INTRAVENOUS
  Filled 2015-04-10: qty 4

## 2015-04-10 MED ORDER — CLONIDINE HCL 0.2 MG/24HR TD PTWK
0.2000 mg | MEDICATED_PATCH | TRANSDERMAL | Status: DC
  Administered 2015-04-10: 0.2 mg via TRANSDERMAL
  Filled 2015-04-10: qty 1

## 2015-04-10 MED ORDER — FUROSEMIDE 10 MG/ML IJ SOLN: 40.0000 mg | Freq: Two times a day (BID) | INTRAMUSCULAR | Status: DC

## 2015-04-10 NOTE — Progress Notes (Signed)
Pt consistently complaining about his foley catheter over the course of the night.  Spoke with Dr. Prescott Gum and received verbal order to remove catheter.

## 2015-04-10 NOTE — Progress Notes (Signed)
Called RT bedside for PRN breathing treatment.

## 2015-04-10 NOTE — Progress Notes (Signed)
Pt has been asked on multiple occasions overnight to ambulate and pt has refused.

## 2015-04-10 NOTE — Progress Notes (Signed)
10mg  IV Hydralazine given

## 2015-04-10 NOTE — Progress Notes (Signed)
Speech Language Pathology Treatment: Dysphagia  Patient Details Name: [name] MRN: [id number] DOB: July 06, 1961 Today's Date: 04/10/2015 Time: 0910-0950 SLP Time Calculation (min) (ACUTE ONLY): 40 min  Assessment / Plan / Recommendation Clinical Impression  F/u after yesterday's FEES.  Pt with inconsistent recall of results and recs.  Family present.  Taught pt masako and mendelsohn maneuvers to target submental and pharyngeal musculature.  Able to execute with mod verbal/tactile cues.  Consumed limited ice chips with persisting cough/wet phonation.  D/W Dr. Prescott Gum - will repeat FEES on Monday, 4/25; continue NPO except occasional ice chips, meds crushed with puree.  Family agrees with plan.    HPI HPI:  54 y.o. man underwent aortic valve replacment 4/18.  Pt was intubated for surgery and extubated within 24 hours,on 4/19.  PMH:  obesity, HTN, sliding Hiatal Hernia with reflux.  Clinical swallow evaluation on 4/20 revealed dysphagia with concerns for aspiration.  Orders for FEES 04/09/15.    Pertinent Vitals    SLP Plan  Continue with current plan of care (repeat FEES 3/25)    Recommendations Diet recommendations: NPO (ice chips after oral care) Medication Administration: Crushed with puree              Oral Care Recommendations: Oral care Q4 per protocol;Oral care prior to ice chips Plan: Continue with current plan of care (repeat FEES 3/25)    GO   Kahiau Schewe L. Tivis Ringer, [place] CCC/SLP Pager [phone]   Juan Quam Laurice 04/10/2015, 10:01 AM

## 2015-04-11 ENCOUNTER — Inpatient Hospital Stay (HOSPITAL_COMMUNITY): Payer: Medicaid Other

## 2015-04-11 LAB — CBC
HCT: 28.1 % — ABNORMAL LOW (ref 39.0–52.0)
Hemoglobin: 9.3 g/dL — ABNORMAL LOW (ref 13.0–17.0)
MCH: 26.6 pg (ref 26.0–34.0)
MCHC: 33.1 g/dL (ref 30.0–36.0)
MCV: 80.3 fL (ref 78.0–100.0)
Platelets: 308 10*3/uL (ref 150–400)
RBC: 3.5 MIL/uL — ABNORMAL LOW (ref 4.22–5.81)
RDW: 15.2 % (ref 11.5–15.5)
WBC: 16.6 10*3/uL — ABNORMAL HIGH (ref 4.0–10.5)

## 2015-04-11 LAB — COMPREHENSIVE METABOLIC PANEL
ALT: 40 U/L (ref 0–53)
AST: 32 U/L (ref 0–37)
Albumin: 2.6 g/dL — ABNORMAL LOW (ref 3.5–5.2)
Alkaline Phosphatase: 91 U/L (ref 39–117)
Anion gap: 11 (ref 5–15)
BUN: 29 mg/dL — ABNORMAL HIGH (ref 6–23)
CO2: 27 mmol/L (ref 19–32)
Calcium: 8.8 mg/dL (ref 8.4–10.5)
Chloride: 104 mmol/L (ref 96–112)
Creatinine, Ser: 1.27 mg/dL (ref 0.50–1.35)
GFR calc Af Amer: 72 mL/min — ABNORMAL LOW (ref >90)
GFR calc non Af Amer: 62 mL/min — ABNORMAL LOW (ref >90)
Glucose, Bld: 126 mg/dL — ABNORMAL HIGH (ref 70–99)
Potassium: 4.1 mmol/L (ref 3.5–5.1)
Sodium: 142 mmol/L (ref 135–145)
Total Bilirubin: 4.9 mg/dL — ABNORMAL HIGH (ref 0.3–1.2)
Total Protein: 6.4 g/dL (ref 6.0–8.3)

## 2015-04-11 LAB — CULTURE, RESPIRATORY

## 2015-04-11 LAB — CULTURE, RESPIRATORY W GRAM STAIN

## 2015-04-11 MED ORDER — SODIUM CHLORIDE 0.9 % IJ SOLN: 3.0000 mL | INTRAMUSCULAR | Status: DC | PRN

## 2015-04-11 MED ORDER — METOPROLOL TARTRATE 1 MG/ML IV SOLN
5.0000 mg | Freq: Four times a day (QID) | INTRAVENOUS | Status: DC
  Administered 2015-04-11 – 2015-04-13 (×7): 5 mg via INTRAVENOUS
  Filled 2015-04-11 (×12): qty 5

## 2015-04-11 MED ORDER — SODIUM CHLORIDE 0.9 % IV SOLN
INTRAVENOUS | Status: DC
  Administered 2015-04-11: 75 mL/h via INTRAVENOUS
  Administered 2015-04-15: 02:00:00 via INTRAVENOUS

## 2015-04-11 MED ORDER — SODIUM CHLORIDE 0.9 % IJ SOLN
10.0000 mL | INTRAMUSCULAR | Status: DC | PRN
  Administered 2015-04-11 – 2015-04-21 (×10): 10 mL
  Administered 2015-04-22: 20 mL
  Administered 2015-04-22 – 2015-04-23 (×2): 10 mL
  Filled 2015-04-11 (×13): qty 40

## 2015-04-11 MED ORDER — INSULIN ASPART 100 UNIT/ML ~~LOC~~ SOLN
0.0000 [IU] | Freq: Three times a day (TID) | SUBCUTANEOUS | Status: DC
  Administered 2015-04-12 (×2): 1 [IU] via SUBCUTANEOUS
  Administered 2015-04-12: 2 [IU] via SUBCUTANEOUS
  Administered 2015-04-13 – 2015-04-19 (×9): 1 [IU] via SUBCUTANEOUS
  Administered 2015-04-20: 2 [IU] via SUBCUTANEOUS

## 2015-04-11 MED ORDER — FUROSEMIDE 10 MG/ML IJ SOLN
20.0000 mg | Freq: Every day | INTRAMUSCULAR | Status: DC
  Administered 2015-04-12: 20 mg via INTRAVENOUS
  Filled 2015-04-11 (×2): qty 2

## 2015-04-11 MED ORDER — M.V.I. ADULT IV INJ
INTRAVENOUS | Status: AC
  Administered 2015-04-11: 19:00:00 via INTRAVENOUS
  Filled 2015-04-11: qty 960

## 2015-04-11 MED ORDER — TRAVASOL 10 % IV SOLN: INTRAVENOUS | Status: DC

## 2015-04-11 MED ORDER — SODIUM CHLORIDE 0.9 % IJ SOLN
10.0000 mL | Freq: Two times a day (BID) | INTRAMUSCULAR | Status: DC
  Administered 2015-04-17 – 2015-04-19 (×3): 10 mL

## 2015-04-11 MED ORDER — SODIUM CHLORIDE 0.9 % IJ SOLN
3.0000 mL | Freq: Two times a day (BID) | INTRAMUSCULAR | Status: DC
  Administered 2015-04-12: 3 mL via INTRAVENOUS

## 2015-04-11 MED ORDER — FAT EMULSION 20 % IV EMUL
240.0000 mL | INTRAVENOUS | Status: AC
  Administered 2015-04-11: 240 mL via INTRAVENOUS
  Filled 2015-04-11: qty 250

## 2015-04-11 MED ORDER — SODIUM CHLORIDE 0.9 % IV SOLN: 250.0000 mL | INTRAVENOUS | Status: DC | PRN

## 2015-04-11 MED ORDER — MOVING RIGHT ALONG BOOK
Freq: Once | Status: AC
  Administered 2015-04-11: 15:00:00
  Filled 2015-04-11: qty 1

## 2015-04-11 MED ORDER — INSULIN ASPART 100 UNIT/ML ~~LOC~~ SOLN: 0.0000 [IU] | Freq: Three times a day (TID) | SUBCUTANEOUS | Status: DC

## 2015-04-11 NOTE — Progress Notes (Signed)
Peripherally Inserted Central Catheter/Midline Placement  The IV Nurse has discussed with the patient and/or persons authorized to consent for the patient, the purpose of this procedure and the potential benefits and risks involved with this procedure.  The benefits include less needle sticks, lab draws from the catheter and patient may be discharged home with the catheter.  Risks include, but not limited to, infection, bleeding, blood clot (thrombus formation), and puncture of an artery; nerve damage and irregular heat beat.  Alternatives to this procedure were also discussed.  PICC/Midline Placement Documentation  PICC / Midline Double Lumen 38/17/71 PICC Right Basilic 47 cm 0 cm (Active)  Indication for Insertion or Continuance of Line Administration of hyperosmolar/irritating solutions (i.e. TPN, Vancomycin, etc.) 04/11/2015  4:33 PM  Exposed Catheter (cm) 0 cm 04/11/2015  4:33 PM  Site Assessment Clean;Dry;Intact 04/11/2015  4:33 PM  Lumen #1 Status Flushed;Saline locked;Blood return noted 04/11/2015  4:33 PM  Lumen #2 Status Flushed;Saline locked;Blood return noted 04/11/2015  4:33 PM  Dressing Change Due 04/18/15 04/11/2015  4:33 PM       Gordan Payment 04/11/2015, 4:35 PM

## 2015-04-11 NOTE — Progress Notes (Signed)
PARENTERAL NUTRITION CONSULT NOTE - INITIAL  Pharmacy Consult for TPN Indication: Severe dysphagia   No Known Allergies  Patient Measurements: Height: 5\' 11"  (180.3 cm) Weight: 221 lb 12.5 oz (100.6 kg) IBW/kg (Calculated) : 75.3 Adjusted Body Weight: 82 kg   Vital Signs: Temp: 98.1 F (36.7 C) (04/24 0747) Temp Source: Oral (04/24 0747) BP: 153/107 mmHg (04/24 0800) Pulse Rate: 88 (04/24 0800) Intake/Output from previous day: 04/23 0701 - 04/24 0700 In: 581.8 [I.V.:331.8; IV Piggyback:250] Out: 800 [Urine:800] Intake/Output from this shift: Total I/O In: 13.8 [I.V.:13.8] Out: -   Labs:  Recent Labs  04/09/15 0315 04/10/15 0237 04/10/15 1720 04/11/15 0331  WBC 14.0* 16.2*  --  16.6*  HGB 9.5* 9.6* 10.5* 9.3*  HCT 27.6* 28.7* 31.0* 28.1*  PLT 193 253  --  308     Recent Labs  04/09/15 0315 04/10/15 0237 04/10/15 1720 04/11/15 0331  NA 139 140 142 142  K 3.6 3.6 3.9 4.1  CL 102 103 102 104  CO2 27 25  --  27  GLUCOSE 133* 144* 112* 126*  BUN 29* 30* 29* 29*  CREATININE 1.41* 1.41* 1.20 1.27  CALCIUM 8.9 8.8  --  8.8  PROT 5.9* 6.3  --  6.4  ALBUMIN 2.8* 2.8*  --  2.6*  AST 39* 35  --  32  ALT 46 47  --  40  ALKPHOS 71 92  --  91  BILITOT 5.7* 6.5*  --  4.9*   Estimated Creatinine Clearance: 80.3 mL/min (by C-G formula based on Cr of 1.27).    Recent Labs  04/09/15 1947 04/10/15 0015 04/10/15 0733  GLUCAP 122* 124* 114*    Medical History: Past Medical History  Diagnosis Date  . Insomnia   . CHF (congestive heart failure)   . Congenital insufficiency of aortic valve   . Congestive cardiomyopathy   . Atrial flutter   . Benign hypertensive heart and renal disease   . Essential hypertension, malignant   . SOB (shortness of breath)   . Murmur   . Aortic stenosis 11/19/2014  . 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 Benign essential HTN 11/19/2014  . Chronic systolic CHF (congestive heart failure) 11/19/2014    Medications:   Prescriptions prior to admission  Medication Sig Dispense Refill Last Dose  . albuterol (PROVENTIL HFA;VENTOLIN HFA) 108 (90 BASE) MCG/ACT inhaler Inhale 2 puffs into the lungs every 4 (four) hours as needed for wheezing or shortness of breath.   03/27/2015 at Unknown time  . aspirin 81 MG tablet Take 81 mg by mouth daily.   03/27/2015 at Unknown time  . atorvastatin (LIPITOR) 40 MG tablet Take 40 mg by mouth daily.   03/27/2015 at Unknown time  . carvedilol (COREG) 25 MG tablet Take 1 tablet (25 mg total) by mouth 2 (two) times daily with a meal. 60 tablet 6 03/27/2015 at 200 pm  . clopidogrel (PLAVIX) 75 MG tablet Take 75 mg by mouth daily.   03/27/2015 at Unknown time  . furosemide (LASIX) 40 MG tablet Take 40 mg by mouth 2 (two) times daily.    03/27/2015 at Unknown time  . hydrALAZINE (APRESOLINE) 25 MG tablet Take 37.5 mg by mouth 3 (three) times daily.   03/27/2015 at Unknown time  . potassium chloride SA (KLOR-CON M20) 20 MEQ tablet Take 1 tablet (20 mEq total) by mouth daily. 30 tablet 1 03/27/2015 at Unknown time  . ranitidine (ZANTAC) 150 MG capsule Take 150 mg by mouth daily.  03/27/2015 at Unknown time  . vitamin B-12 (CYANOCOBALAMIN) 500 MCG tablet Take 500 mcg by mouth daily.   03/27/2015 at Unknown time    Insulin Requirements in the past 24 hours:  None   Assessment: 60 YOM with an extensive cardiac history presented to the ED on 4/9 with progressive shortness of breath from acute on chronic HF. A 2D Echo showed severe aortic insufficiency and LV dysfunction and underwent aortic valve replacement, MAZE and aorta graft between 4/18 and 4/21. After the procedures, the patient remained very hoarse and unable to swallow safely which was deemed to have likely be caused by tube trauma. Pharmacy consulted to start TPN while requesting an enteral evaluation.   Surgeries/Procedures: AVR, MAZE, aorta graft   GI: Swallow eval on 4/23 confirmed patient is at risk for aspiration. Currently NPO.  Endo: No  h/o DM, CBGs 112-144  Lytes: K 4.1, CorrCa 9.92 Renal: SCr down to 1.27 (BL~ 0.8 -1 ) Pulm: 3L Joes  Cards: H/o CHF, Aflutter, HTN, aortic valve insufficiency,  Hepatobil: Total Bili 6.5>>4.9 (BL ~ 1.6) Neuro:  ID: WBC 16.6, On Fortaz for empiric pneumonia coverage  Best Practices: Lovenox  TPN Access: PICC line order in place  TPN start date: 4/24>>  Nutritional Goals:  Per RD recommendations   Current Nutrition:  None    Plan:  -Start Clinimix E 5/15 @ 40 mL/hr and IVFE @ 10 mL/hr. Advance to goal as directed -Daily MVI and M/W/F TE till bili returns to baseline  -Start sensitive scale SSI -F/u enteral evaluation -F/u TPN labs tomorrow  -F/u RD recommendations   Albertina Parr, PharmD., BCPS Clinical Pharmacist Pager [phone]

## 2015-04-11 NOTE — Progress Notes (Signed)
6 Days Post-Op Procedure(s) (LRB): AORTIC VALVE REPLACEMENT (AVR) using a 62mm Edwards Aortic Magna Ease Valve  (N/A) TRANSESOPHAGEAL ECHOCARDIOGRAM (TEE) (N/A) MAZE (N/A) REPLACEMENT ASCENDING AORTA with a 90mm Hemashield Platinum Graft (N/A) Subjective:  pulm status better Still with severe swallow disorder - prob tube trauma Will place on TPN for nutrition and request ENT eval  Objective: Vital signs in last 24 hours: Temp:  [97.6 F (36.4 C)-98.3 F (36.8 C)] 98.1 F (36.7 C) (04/24 0747) Pulse Rate:  [28-92] 88 (04/24 0800) Cardiac Rhythm:  [-] Normal sinus rhythm (04/24 0800) Resp:  [14-27] 18 (04/24 0800) BP: (130-171)/(64-132) 153/107 mmHg (04/24 0800) SpO2:  [84 %-100 %] 98 % (04/24 0930) Weight:  [221 lb 12.5 oz (100.6 kg)] 221 lb 12.5 oz (100.6 kg) (04/24 0600)  Hensrmodynamic parameters for last 24 hours:    Intake/Output from previous day: 04/23 0701 - 04/24 0700 In: 581.8 [I.V.:331.8; IV Piggyback:250] Out: 800 [Urine:800] Intake/Output this shift: Total I/O In: 13.8 [I.V.:13.8] Out: -   Ambulating well CXR with infiltrate RLL - possible pneumonia Lab Results:  Recent Labs  04/10/15 0237 04/10/15 1720 04/11/15 0331  WBC 16.2*  --  16.6*  HGB 9.6* 10.5* 9.3*  HCT 28.7* 31.0* 28.1*  PLT 253  --  308   BMET:  Recent Labs  04/10/15 0237 04/10/15 1720 04/11/15 0331  NA 140 142 142  K 3.6 3.9 4.1  CL 103 102 104  CO2 25  --  27  GLUCOSE 144* 112* 126*  BUN 30* 29* 29*  CREATININE 1.41* 1.20 1.27  CALCIUM 8.8  --  8.8    PT/INR: No results for input(s): LABPROT, INR in the last 72 hours. ABG    Component Value Date/Time   PHART 7.403 04/07/2015 1524   HCO3 24.0 04/07/2015 1524   TCO2 25 04/10/2015 1720   ACIDBASEDEF 1.0 04/07/2015 1524   O2SAT 64.9 04/09/2015 0325   CBG (last 3)   Recent Labs  04/09/15 1947 04/10/15 0015 04/10/15 0733  GLUCAP 122* 124* 114*    Assessment/Plan: S/P Procedure(s) (LRB): AORTIC VALVE  REPLACEMENT (AVR) using a 41mm Edwards Aortic Magna Ease Valve  (N/A) TRANSESOPHAGEAL ECHOCARDIOGRAM (TEE) (N/A) MAZE (N/A) REPLACEMENT ASCENDING AORTA with a 32mm Hemashield Platinum Graft (N/A) Mobilize Plan for transfer to step-down: see transfer orders TPN for nutrition    LOS: 15 days    [name] 04/11/2015

## 2015-04-12 LAB — MAGNESIUM: Magnesium: 2.5 mg/dL (ref 1.5–2.5)

## 2015-04-12 LAB — COMPREHENSIVE METABOLIC PANEL
ALT: 46 U/L (ref 0–53)
AST: 51 U/L — ABNORMAL HIGH (ref 0–37)
Albumin: 2.5 g/dL — ABNORMAL LOW (ref 3.5–5.2)
Alkaline Phosphatase: 191 U/L — ABNORMAL HIGH (ref 39–117)
Anion gap: 9 (ref 5–15)
BUN: 26 mg/dL — ABNORMAL HIGH (ref 6–23)
CO2: 29 mmol/L (ref 19–32)
Calcium: 8.7 mg/dL (ref 8.4–10.5)
Chloride: 105 mmol/L (ref 96–112)
Creatinine, Ser: 1.21 mg/dL (ref 0.50–1.35)
GFR calc Af Amer: 77 mL/min — ABNORMAL LOW (ref >90)
GFR calc non Af Amer: 66 mL/min — ABNORMAL LOW (ref >90)
Glucose, Bld: 143 mg/dL — ABNORMAL HIGH (ref 70–99)
Potassium: 4 mmol/L (ref 3.5–5.1)
Sodium: 143 mmol/L (ref 135–145)
Total Bilirubin: 4 mg/dL — ABNORMAL HIGH (ref 0.3–1.2)
Total Protein: 6.1 g/dL (ref 6.0–8.3)

## 2015-04-12 LAB — GLUCOSE, CAPILLARY
GLUCOSE-CAPILLARY: 137 mg/dL — AB (ref 70–99)
Glucose-Capillary: 142 mg/dL — ABNORMAL HIGH (ref 70–99)
Glucose-Capillary: 122 mg/dL — ABNORMAL HIGH (ref 70–99)
Glucose-Capillary: 161 mg/dL — ABNORMAL HIGH (ref 70–99)
Glucose-Capillary: 127 mg/dL — ABNORMAL HIGH (ref 70–99)

## 2015-04-12 LAB — DIFFERENTIAL
Basophils Absolute: 0 10*3/uL (ref 0.0–0.1)
Basophils Relative: 0 % (ref 0–1)
Eosinophils Absolute: 0.1 10*3/uL (ref 0.0–0.7)
Eosinophils Relative: 0 % (ref 0–5)
Lymphocytes Relative: 9 % — ABNORMAL LOW (ref 12–46)
Lymphs Abs: 1.4 10*3/uL (ref 0.7–4.0)
Monocytes Absolute: 1.8 10*3/uL — ABNORMAL HIGH (ref 0.1–1.0)
Monocytes Relative: 11 % (ref 3–12)
Neutro Abs: 13.1 10*3/uL — ABNORMAL HIGH (ref 1.7–7.7)
Neutrophils Relative %: 80 % — ABNORMAL HIGH (ref 43–77)

## 2015-04-12 LAB — TRIGLYCERIDES: Triglycerides: 113 mg/dL (ref <150)

## 2015-04-12 LAB — CBC
HCT: 28.8 % — ABNORMAL LOW (ref 39.0–52.0)
Hemoglobin: 9.2 g/dL — ABNORMAL LOW (ref 13.0–17.0)
MCH: 26.7 pg (ref 26.0–34.0)
MCHC: 31.9 g/dL (ref 30.0–36.0)
MCV: 83.5 fL (ref 78.0–100.0)
Platelets: 377 10*3/uL (ref 150–400)
RBC: 3.45 MIL/uL — ABNORMAL LOW (ref 4.22–5.81)
RDW: 15.7 % — ABNORMAL HIGH (ref 11.5–15.5)
WBC: 16.4 10*3/uL — ABNORMAL HIGH (ref 4.0–10.5)

## 2015-04-12 LAB — PHOSPHORUS: Phosphorus: 3.7 mg/dL (ref 2.3–4.6)

## 2015-04-12 MED ORDER — FAT EMULSION 20 % IV EMUL
240.0000 mL | INTRAVENOUS | Status: DC
  Administered 2015-04-12: 240 mL via INTRAVENOUS
  Filled 2015-04-12: qty 250

## 2015-04-12 MED ORDER — TRACE MINERALS CR-CU-F-FE-I-MN-MO-SE-ZN IV SOLN
INTRAVENOUS | Status: DC
  Administered 2015-04-12: 18:00:00 via INTRAVENOUS
  Filled 2015-04-12: qty 1680

## 2015-04-12 MED ORDER — LIDOCAINE VISCOUS 2 % MT SOLN
15.0000 mL | Freq: Once | OROMUCOSAL | Status: DC
  Filled 2015-04-12: qty 15

## 2015-04-12 NOTE — Progress Notes (Signed)
Pt upset about having another bag of TPN and lipids running.  Pt wanted doctor called because he was eating and "we are dysfunctional".  I tried to explain to patient that it was our policy to titrate off of TPN and not trying to overcharge him. Patient seemed to accept explanation. Pt resting with call bell within reach.  Will continue to monitor. Payton Emerald, RN

## 2015-04-12 NOTE — Progress Notes (Signed)
Called to patient room concerning bath.  Patient upset that he was set up for a bath and not given a full bath. I tried to explain that we would like for him to participate in bathing process as he was progressing and wanted to go home.  Pt kept repeating that he only had three baths in hospital and his wife was bathing him the most often. Patient asked that NT leave room and that he not be bothered until he calmed down.  I attempted to call wife and had to leave voice mail to return call to patient room.  Patient would not accept offer to bathe. Patient said he wanted to talk to his wife first.

## 2015-04-12 NOTE — Procedures (Signed)
Objective Swallowing Evaluation: Fiberoptic Endoscopic Evaluation of Swallowing  Patient Details  Name: [name] MRN: [id number] Date of Birth: 21-Sep-1961  Today's Date: 04/12/2015 Time: SLP Start Time (ACUTE ONLY): 0910-SLP Stop Time (ACUTE ONLY): 0950 SLP Time Calculation (min) (ACUTE ONLY): 40 min  Past Medical History:  Past Medical History  Diagnosis Date  . Insomnia   . CHF (congestive heart failure)   . Congenital insufficiency of aortic valve   . Congestive cardiomyopathy   . Atrial flutter   . Benign hypertensive heart and renal disease   . Essential hypertension, malignant   . SOB (shortness of breath)   . Murmur   . Aortic stenosis 11/19/2014  . 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 Benign essential HTN 11/19/2014  . Chronic systolic CHF (congestive heart failure) 11/19/2014   Past Surgical History:  Past Surgical History  Procedure Laterality Date  . Left and right heart catheterization with coronary angiogram N/A 11/27/2014    Procedure: LEFT AND RIGHT HEART CATHETERIZATION WITH CORONARY ANGIOGRAM;  Surgeon: [name], MD;  Location: [facility] C[facility] LAB;  Service: Cardiovascular;  Laterality: N/A;  . Aortic valve replacement N/A 04/05/2015    Procedure: AORTIC VALVE REPLACEMENT (AVR) using a 38mm Edwards Aortic Magna Ease Valve ;  Surgeon: [name], MD;  Location: [place];  Service: Open Heart Surgery;  Laterality: N/A;  . Tee without cardioversion N/A 04/05/2015    Procedure: TRANSESOPHAGEAL ECHOCARDIOGRAM (TEE);  Surgeon: [name], MD;  Location: [place];  Service: Open Heart Surgery;  Laterality: N/A;  . Maze N/A 04/05/2015    Procedure: MAZE;  Surgeon: [name], MD;  Location: [place];  Service: Open Heart Surgery;  Laterality: N/A;  . Replacement ascending aorta N/A 04/05/2015    Procedure: REPLACEMENT ASCENDING AORTA with a 66mm Hemashield Platinum Graft;  Surgeon: [name], MD;  Location: [place];  Service: Open Heart Surgery;   Laterality: N/A;   HPI:  HPI:  54 y.o. man underwent aortic valve replacment 4/18.  Pt was intubated for surgery and extubated within 24 hours,on 4/19.  PMH:  obesity, HTN, sliding Hiatal Hernia with reflux.  Clinical swallow evaluation on 4/20 revealed dysphagia with concerns for aspiration.  Orders for FEES 04/09/15.   No Data Recorded  Assessment / Plan / Recommendation CHL IP CLINICAL IMPRESSIONS 04/10/2015  Dysphagia Diagnosis Moderate pharyngeal phase dysphagia  Clinical impression Swallow function is gradually improving, as is voice quality.  Pt continues to present with a moderate pharyngeal dysphagie, with reduced tongue contraction to the pharyngeal wall, reduced pharyngeal perestalsis, decreased laryngeal elevation, which results in residue of purees and liquids in the vallecule and pyriforms, and a small amount of residue at the interarytenoid space, which is at risk for spillage in the airway.  Indepth education to the head turn combined with chin tuck, and use of the Supraglottic swallow (using FEES to visualize the effects) was completed.  Pt appears to understand and demonstrated ability to follow these strategies.  Pt may begin po's with careful adherence to these precautions and strategies (will need supervision and verbal cues initially).  Pt may have ice chips (1/4 cup) 30 minutes after meals, but only after thorough oral care and use of antibacterial mouthwash.      CHL IP TREATMENT RECOMMENDATION 04/10/2015  Treatment Plan Recommendations Therapy as outlined in treatment plan below     CHL IP DIET RECOMMENDATION 04/10/2015  Diet Recommendations (None)  Liquid Administration via (None)  Medication Administration Crushed  with puree  Compensations (None)  Postural Changes and/or Swallow Maneuvers (None)     CHL IP OTHER RECOMMENDATIONS 04/10/2015  Recommended Consults (None)  Oral Care Recommendations Oral care Q4 per protocol;Oral care prior to ice chips  Other Recommendations  (None)     CHL IP FOLLOW UP RECOMMENDATIONS 04/10/2015  Follow up Recommendations Inpatient Rehab     CHL IP FREQUENCY AND DURATION 04/10/2015  Speech Therapy Frequency (ACUTE ONLY) min 3x week  Treatment Duration 2 weeks     Pertinent Vitals/Pain n/a         CHL IP REASON FOR REFERRAL 04/12/2015  Reason for Referral Objectively evaluate swallowing function     CHL IP ORAL PHASE 04/12/2015  Lips (None)  Tongue (None)  Mucous membranes (None)  Nutritional status (None)  Other (None)  Oxygen therapy (None)  Oral Phase WFL  Oral - Pudding Teaspoon (None)  Oral - Pudding Cup (None)  Oral - Honey Teaspoon (None)  Oral - Honey Cup (None)  Oral - Honey Syringe (None)  Oral - Nectar Teaspoon (None)  Oral - Nectar Cup (None)  Oral - Nectar Straw (None)  Oral - Nectar Syringe (None)  Oral - Ice Chips (None)  Oral - Thin Teaspoon (None)  Oral - Thin Cup (None)  Oral - Thin Straw (None)  Oral - Thin Syringe (None)  Oral - Puree (None)  Oral - Mechanical Soft (None)  Oral - Regular (None)  Oral - Multi-consistency (None)  Oral - Pill (None)  Oral Phase - Comment (None)      CHL IP PHARYNGEAL PHASE 04/12/2015  Pharyngeal Phase Impaired  Pharyngeal - Pudding Teaspoon (None)  Penetration/Aspiration details (pudding teaspoon) (None)  Pharyngeal - Pudding Cup (None)  Penetration/Aspiration details (pudding cup) (None)  Pharyngeal - Honey Teaspoon Reduced pharyngeal peristalsis;Reduced laryngeal elevation;Reduced tongue base retraction;Pharyngeal residue - valleculae;Pharyngeal residue - pyriform sinuses;Pharyngeal residue - cp segment  Penetration/Aspiration details (honey teaspoon) Material does not enter airway  Pharyngeal - Honey Cup Reduced pharyngeal peristalsis;Reduced laryngeal elevation;Reduced tongue base retraction;Pharyngeal residue - valleculae;Pharyngeal residue - pyriform sinuses;Pharyngeal residue - cp segment  Penetration/Aspiration details (honey cup) (None)   Pharyngeal - Honey Syringe (None)  Penetration/Aspiration details (honey syringe) (None)  Pharyngeal - Nectar Teaspoon Reduced pharyngeal peristalsis;Reduced laryngeal elevation;Reduced tongue base retraction;Pharyngeal residue - valleculae;Pharyngeal residue - pyriform sinuses;Pharyngeal residue - cp segment  Penetration/Aspiration details (nectar teaspoon) Material does not enter airway  Pharyngeal - Nectar Cup Reduced pharyngeal peristalsis;Reduced laryngeal elevation;Reduced tongue base retraction;Pharyngeal residue - valleculae;Pharyngeal residue - pyriform sinuses;Pharyngeal residue - cp segment  Penetration/Aspiration details (nectar cup) (None)  Pharyngeal - Nectar Straw (None)  Penetration/Aspiration details (nectar straw) (None)  Pharyngeal - Nectar Syringe (None)  Penetration/Aspiration details (nectar syringe) (None)  Pharyngeal - Ice Chips (None)  Penetration/Aspiration details (ice chips) (None)  Pharyngeal - Thin Teaspoon (None)  Penetration/Aspiration details (thin teaspoon) (None)  Pharyngeal - Thin Cup (None)  Penetration/Aspiration details (thin cup) (None)  Pharyngeal - Thin Straw (None)  Penetration/Aspiration details (thin straw) (None)  Pharyngeal - Thin Syringe (None)  Penetration/Aspiration details (thin syringe') (None)  Pharyngeal - Puree (None)  Penetration/Aspiration details (puree) (None)  Pharyngeal - Mechanical Soft (None)  Penetration/Aspiration details (mechanical soft) (None)  Pharyngeal - Regular (None)  Penetration/Aspiration details (regular) (None)  Pharyngeal - Multi-consistency (None)  Penetration/Aspiration details (multi-consistency) (None)  Pharyngeal - Pill (None)  Penetration/Aspiration details (pill) (None)  Pharyngeal Comment (None)     CHL IP CERVICAL ESOPHAGEAL PHASE 04/10/2015  Cervical Esophageal Phase WFL  Pudding Teaspoon (  None)  Pudding Cup (None)  Honey Teaspoon (None)  Honey Cup (None)  Honey Syringe (None)  Nectar  Teaspoon (None)  Nectar Cup (None)  Nectar Straw (None)  Nectar Syringe (None)  Thin Teaspoon (None)  Thin Cup (None)  Thin Straw (None)  Thin Syringe (None)  Cervical Esophageal Comment (None)    No flowsheet data found.         Quinn Axe T 04/12/2015, 4:48 PM

## 2015-04-12 NOTE — Progress Notes (Signed)
INITIAL NUTRITION ASSESSMENT  DOCUMENTATION CODES Per approved criteria  -Obesity Unspecified   INTERVENTION:  Consider transitioning to EN support via post-pyloric feeding tube?  TPN per pharmacy RD to follow for nutrition care plan  NUTRITION DIAGNOSIS: Inadequate oral intake related to dysphagia, acute vocal cord paralysis as evidenced by NPO status  Goal: Pt to meet >/= 90% of their estimated nutrition needs   Monitor:  TPN prescription, EN initiation, PO diet advancement, weight, labs, I/O's  Reason for Assessment: TPN  54 y.o. male  Admitting Dx: Acute on chronic systolic heart failure  ASSESSMENT: 54 yo Man with PMH of hypertension, atrial flutter, mild AS, moderate AI and nonischemic cardiomyopathy with MUGA EF ~ 35% after LHC 15-20% after being hospitalized several times for CHF having 12/11-12/12/15 admission for RHC/LHC. He presents today with 2 weeks of progressive shortness of breath. No infectious symptoms - no fever/chills/nausea/vomiting/diarrhea. He has 20 feet dyspnea on exertion. He tells me he sleeps on 8 pillows/recliner. He wakes up SOB from sleep. He received IV lasix in the ER. ECG noted to be in atrial flutter with BNP 2700 and Trop 0.1. No current chest pain.   Patient s/p procedures 4/18: AORTIC VALVE REPLACEMENT  MAZE (RIGHT ATRIAL SET) REPLACEMENT ASCENDING AORTA  Pt s/p FEES 4/22.  Dx with moderate pharyngeal phase dysphagia.  SLP recommending NPO status.  ENT note reviewed 4/25.  Pt with acute left vocal cord paralysis.  Patient is receiving TPN with Clinimix E 5/15 @ 70 ml/hr and lipids @ 10 ml/hr. Provides 1672 kcal and 84 grams protein per day. Meets 76% minimum estimated energy needs and 70% minimum estimated protein needs.  Height: Ht Readings from Last 1 Encounters:  03/28/15 5\' 11"  (1.803 m)    Weight: Wt Readings from Last 1 Encounters:  04/12/15 220 lb 14.4 oz (100.2 kg)    Ideal Body Weight: 172 lb  % Ideal Body Weight:  128%  Wt Readings from Last 10 Encounters:  04/12/15 220 lb 14.4 oz (100.2 kg)  11/27/14 238 lb 1.6 oz (108 kg)  11/19/14 232 lb (105.235 kg)    Usual Body Weight: 238 lb  % Usual Body Weight: 92%  BMI:  Body mass index is 30.82 kg/(m^2).  Estimated Nutritional Needs: Kcal: 2200-2400 Protein: 120-130 gm Fluid: per MD  Skin: chest & R axilla surgical incisions   Diet Order: TPN (CLINIMIX-E) Adult TPN (CLINIMIX-E) Adult DIET SOFT Room service appropriate?: Yes; Fluid consistency:: Pudding Thick  EDUCATION NEEDS: -No education needs identified at this time   Intake/Output Summary (Last 24 hours) at 04/12/15 1159 Last data filed at 04/12/15 0130  Gross per 24 hour  Intake  117.5 ml  Output   1300 ml  Net -1182.5 ml    Labs:   Recent Labs Lab 04/06/15 0421 04/06/15 1615  04/10/15 0237 04/10/15 1720 04/11/15 0331 04/12/15 0545  NA 138  --   < > 140 142 142 143  K 5.3*  --   < > 3.6 3.9 4.1 4.0  CL 105  --   < > 103 102 104 105  CO2 23  --   < > 25  --  27 29  BUN 16  --   < > 30* 29* 29* 26*  CREATININE 1.42* 1.31  < > 1.41* 1.20 1.27 1.21  CALCIUM 8.8  --   < > 8.8  --  8.8 8.7  MG 2.2 2.0  --   --   --   --  2.5  PHOS  --   --   --   --   --   --  3.7  GLUCOSE 128*  --   < > 144* 112* 126* 143*  < > = values in this interval not displayed.  CBG (last 3)   Recent Labs  04/11/15 2319 04/12/15 0543 04/12/15 1108  GLUCAP 142* 137* 122*    Scheduled Meds: . sodium chloride   Intravenous Once  . antiseptic oral rinse  7 mL Mouth Rinse q12n4p  . aspirin EC  325 mg Oral Daily   Or  . aspirin  324 mg Per Tube Daily  . atorvastatin  40 mg Oral Daily  . bisacodyl  10 mg Oral Daily   Or  . bisacodyl  10 mg Rectal Daily  . budesonide (PULMICORT) nebulizer solution  0.5 mg Nebulization BID  . cefTAZidime (FORTAZ)  IV  1 g Intravenous Q8H  . chlorhexidine  15 mL Mouth Rinse BID  . cloNIDine  0.2 mg Transdermal Weekly  . docusate sodium  200 mg Oral  Daily  . enoxaparin (LOVENOX) injection  40 mg Subcutaneous Q24H  . furosemide  20 mg Intravenous Daily  . insulin aspart  0-9 Units Subcutaneous TID WC  . levalbuterol  0.63 mg Nebulization TID  . lidocaine  15 mL Mouth/Throat Once  . metoprolol  5 mg Intravenous 4 times per day  . pantoprazole (PROTONIX) IV  40 mg Intravenous Q24H  . sodium chloride  10-40 mL Intracatheter Q12H  . sodium chloride  3 mL Intravenous Q12H  . sodium chloride  3 mL Intravenous Q12H    Continuous Infusions: . sodium chloride Stopped (04/09/15 1056)  . sodium chloride Stopped (04/09/15 1056)  . sodium chloride 75 mL/hr at 04/11/15 1400  . Marland KitchenTPN (CLINIMIX-E) Adult 40 mL/hr at 04/11/15 1838   And  . fat emulsion 240 mL (04/11/15 1838)  . Marland KitchenTPN (CLINIMIX-E) Adult     And  . fat emulsion    . lactated ringers Stopped (04/11/15 1258)  . lactated ringers Stopped (04/08/15 2000)    Past Medical History  Diagnosis Date  . Insomnia   . CHF (congestive heart failure)   . Congenital insufficiency of aortic valve   . Congestive cardiomyopathy   . Atrial flutter   . Benign hypertensive heart and renal disease   . Essential hypertension, malignant   . SOB (shortness of breath)   . Murmur   . Aortic stenosis 11/19/2014  . 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 Benign essential HTN 11/19/2014  . Chronic systolic CHF (congestive heart failure) 11/19/2014    Past Surgical History  Procedure Laterality Date  . Left and right heart catheterization with coronary angiogram N/A 11/27/2014    Procedure: LEFT AND RIGHT HEART CATHETERIZATION WITH CORONARY ANGIOGRAM;  Surgeon: [name], MD;  Location: [facility] CATH LAB;  Service: Cardiovascular;  Laterality: N/A;  . Aortic valve replacement N/A 04/05/2015    Procedure: AORTIC VALVE REPLACEMENT (AVR) using a 66mm Edwards Aortic Magna Ease Valve ;  Surgeon: [name], MD;  Location: [place];  Service: Open Heart Surgery;  Laterality: N/A;  . Tee without  cardioversion N/A 04/05/2015    Procedure: TRANSESOPHAGEAL ECHOCARDIOGRAM (TEE);  Surgeon: [name], MD;  Location: [place];  Service: Open Heart Surgery;  Laterality: N/A;  . Maze N/A 04/05/2015    Procedure: MAZE;  Surgeon: [name], MD;  Location: [place];  Service: Open Heart Surgery;  Laterality: N/A;  . Replacement  ascending aorta N/A 04/05/2015    Procedure: REPLACEMENT ASCENDING AORTA with a 43mm Hemashield Platinum Graft;  Surgeon: [name], MD;  Location: [place];  Service: Open Heart Surgery;  Laterality: N/A;    Arthur Holms, RD, LDN Pager #: [phone] After-Hours Pager #: [phone]

## 2015-04-12 NOTE — Progress Notes (Signed)
PARENTERAL NUTRITION CONSULT NOTE - FOLLOW UP  Pharmacy Consult for TPN Indication: Severe dysphagia   No Known Allergies  Patient Measurements: Height: 5\' 11"  (180.3 cm) Weight: 220 lb 14.4 oz (100.2 kg) IBW/kg (Calculated) : 75.3 Adjusted Body Weight: 82 kg   Vital Signs: Temp: 97.3 F (36.3 C) (04/25 0546) Temp Source: Oral (04/25 0546) BP: 155/90 mmHg (04/25 0546) Pulse Rate: 59 (04/25 0546) Intake/Output from previous day: 04/24 0701 - 04/25 0700 In: 222.7 [P.O.:30; I.V.:142.7; IV Piggyback:50] Out: 1600 [Urine:1600] Intake/Output from this shift:    Labs:  Recent Labs  04/10/15 0237 04/10/15 1720 04/11/15 0331 04/12/15 0545  WBC 16.2*  --  16.6* 16.4*  HGB 9.6* 10.5* 9.3* 9.2*  HCT 28.7* 31.0* 28.1* 28.8*  PLT 253  --  308 377     Recent Labs  04/10/15 0237 04/10/15 1720 04/11/15 0331 04/12/15 0544 04/12/15 0545  NA 140 142 142  --  143  K 3.6 3.9 4.1  --  4.0  CL 103 102 104  --  105  CO2 25  --  27  --  29  GLUCOSE 144* 112* 126*  --  143*  BUN 30* 29* 29*  --  26*  CREATININE 1.41* 1.20 1.27  --  1.21  CALCIUM 8.8  --  8.8  --  8.7  MG  --   --   --   --  2.5  PHOS  --   --   --   --  3.7  PROT 6.3  --  6.4  --  6.1  ALBUMIN 2.8*  --  2.6*  --  2.5*  AST 35  --  32  --  51*  ALT 47  --  40  --  46  ALKPHOS 92  --  91  --  191*  BILITOT 6.5*  --  4.9*  --  4.0*  TRIG  --   --   --  113  --    Estimated Creatinine Clearance: 84.2 mL/min (by C-G formula based on Cr of 1.21).    Recent Labs  04/10/15 0733 04/11/15 2319 04/12/15 0543  GLUCAP 114* 142* 137*    Medical History: Past Medical History  Diagnosis Date  . Insomnia   . CHF (congestive heart failure)   . Congenital insufficiency of aortic valve   . Congestive cardiomyopathy   . Atrial flutter   . Benign hypertensive heart and renal disease   . Essential hypertension, malignant   . SOB (shortness of breath)   . Murmur   . Aortic stenosis 11/19/2014  . Congestive  dilated cardiomyopathy 11/19/2014  . Benign essential HTN 11/19/2014  . Chronic systolic CHF (congestive heart failure) 11/19/2014    Medications:  Prescriptions prior to admission  Medication Sig Dispense Refill Last Dose  . albuterol (PROVENTIL HFA;VENTOLIN HFA) 108 (90 BASE) MCG/ACT inhaler Inhale 2 puffs into the lungs every 4 (four) hours as needed for wheezing or shortness of breath.   03/27/2015 at Unknown time  . aspirin 81 MG tablet Take 81 mg by mouth daily.   03/27/2015 at Unknown time  . atorvastatin (LIPITOR) 40 MG tablet Take 40 mg by mouth daily.   03/27/2015 at Unknown time  . carvedilol (COREG) 25 MG tablet Take 1 tablet (25 mg total) by mouth 2 (two) times daily with a meal. 60 tablet 6 03/27/2015 at 200 pm  . clopidogrel (PLAVIX) 75 MG tablet Take 75 mg by mouth daily.   03/27/2015 at Unknown  time  . furosemide (LASIX) 40 MG tablet Take 40 mg by mouth 2 (two) times daily.    03/27/2015 at Unknown time  . hydrALAZINE (APRESOLINE) 25 MG tablet Take 37.5 mg by mouth 3 (three) times daily.   03/27/2015 at Unknown time  . potassium chloride SA (KLOR-CON M20) 20 MEQ tablet Take 1 tablet (20 mEq total) by mouth daily. 30 tablet 1 03/27/2015 at Unknown time  . ranitidine (ZANTAC) 150 MG capsule Take 150 mg by mouth daily.   03/27/2015 at Unknown time  . vitamin B-12 (CYANOCOBALAMIN) 500 MCG tablet Take 500 mcg by mouth daily.   03/27/2015 at Unknown time    Insulin Requirements in the past 24 hours:  1 unit of SSI  Assessment: 23 YOM with an extensive cardiac history presented to the ED on 4/9 with progressive shortness of breath from acute on chronic HF. A 2D Echo showed severe aortic insufficiency and LV dysfunction and underwent aortic valve replacement, MAZE and aorta graft between 4/18 and 4/21. After the procedures, the patient remained very hoarse and unable to swallow safely which was deemed to have likely be caused by tube trauma. Pharmacy consulted to start TPN while requesting an ENT  eval.  Surgeries/Procedures: AVR, MAZE, aorta graft   GI: Swallow eval on 4/23 confirmed patient is at risk for aspiration. Currently NPO.   Endo: No h/o DM, CBGs 140s  Lytes: K 4.0, Phos 3.7, Mg 2.5, CorrCa 9.92  Renal: SCr down to 1.21 (BL~ 0.8 -1 ). UOP 0.7mg /kg/hr yesterday. I/Os not charted appropriately. About net equal with TPN yesterday.  Pulm: 2L [place]   Cards: H/o CHF, Aflutter, HTN, aortic valve insufficiency. HR ok, BP slightly elevated  Hepatobil: Total Bili 6.5 >> 4.0 (BL ~ 1.6). No signs of jaundice. AST slightly elevated and ALT wnl. Triglycerides 113  ID: WBC 16.4, On Fortaz for empiric pneumonia coverage   Best Practices: Lovenox, PPI  TPN Access: PICC double lumen  TPN start date: 4/24>>  Nutritional Goals:  2300-2500kcal and 100-120 g of protein F/U RD recs for goals  Current Nutrition:  NPO Clinimix E 5/15 @ 40 mL/hr and IVFE @ 10 mL/hr  Plan:  -Increase Clinimix E 5/15 to 70 mL/hr and IVFE @ 10 mL/hr. This will provide 1672 kcal and 84 g of protein which will meet 70% of kcal needs and 76% of protein needs. Advance to goal as tolerated. -Daily MVI and change TE to daily -Continue sensitive SSI -F/u TPN labs, Cmet, RD recs, ENT eval  Elenor Quinones, PharmD Clinical Pharmacist Resident Pager [phone] 04/12/2015 7:28 AM

## 2015-04-12 NOTE — Progress Notes (Addendum)
      [address]       ,[place] [address]             [phone]      7 Days Post-Op Procedure(s) (LRB): AORTIC VALVE REPLACEMENT (AVR) using a 27mm Edwards Aortic Magna Ease Valve  (N/A) TRANSESOPHAGEAL ECHOCARDIOGRAM (TEE) (N/A) MAZE (N/A) REPLACEMENT ASCENDING AORTA with a 6mm Hemashield Platinum Graft (N/A)   Subjective:  [name] continues to complain of hoarseness.  Wants to eat.  He is ambulating.   Objective: Vital signs in last 24 hours: Temp:  [97.3 F (36.3 C)-98.7 F (37.1 C)] 97.3 F (36.3 C) (04/25 0546) Pulse Rate:  [57-95] 59 (04/25 0546) Cardiac Rhythm:  [-] Atrial fibrillation (04/24 1621) Resp:  [17-28] 18 (04/25 0546) BP: (107-164)/(46-124) 155/90 mmHg (04/25 0546) SpO2:  [93 %-100 %] 100 % (04/25 0546) Weight:  [220 lb 14.4 oz (100.2 kg)] 220 lb 14.4 oz (100.2 kg) (04/25 0546)  Intake/Output from previous day: 04/24 0701 - 04/25 0700 In: 222.7 [P.O.:30; I.V.:142.7; IV Piggyback:50] Out: 1600 [Urine:1600]  General appearance: alert, cooperative and no distress Heart: regular rate and rhythm Lungs: clear to auscultation bilaterally Abdomen: soft, non-tender; bowel sounds normal; no masses,  no organomegaly Extremities: edema 1-2+ pitting Wound: clean and dry  Lab Results:  Recent Labs  04/11/15 0331 04/12/15 0545  WBC 16.6* 16.4*  HGB 9.3* 9.2*  HCT 28.1* 28.8*  PLT 308 377   BMET:  Recent Labs  04/11/15 0331 04/12/15 0545  NA 142 143  K 4.1 4.0  CL 104 105  CO2 27 29  GLUCOSE 126* 143*  BUN 29* 26*  CREATININE 1.27 1.21  CALCIUM 8.8 8.7    PT/INR: No results for input(s): LABPROT, INR in the last 72 hours. ABG    Component Value Date/Time   PHART 7.403 04/07/2015 1524   HCO3 24.0 04/07/2015 1524   TCO2 25 04/10/2015 1720   ACIDBASEDEF 1.0 04/07/2015 1524   O2SAT 64.9 04/09/2015 0325   CBG (last 3)   Recent Labs  04/10/15 0733 04/11/15 2319 04/12/15 0543  GLUCAP 114* 142* 137*     Assessment/Plan: S/P Procedure(s) (LRB): AORTIC VALVE REPLACEMENT (AVR) using a 105mm Edwards Aortic Magna Ease Valve  (N/A) TRANSESOPHAGEAL ECHOCARDIOGRAM (TEE) (N/A) MAZE (N/A) REPLACEMENT ASCENDING AORTA with a 76mm Hemashield Platinum Graft (N/A)  1. CV- NSR, rate in the 60s this morning, hypertensive- on Clonidine, lopressor, hydralazine prn 2. Pulm- wean oxygen as tolerated, continue flutter valve 3. Renal- creatinine stable, weight is below baseline, + LE pitting edema, will place TED hose, continue diuretics for now 4. Dysphagia- continue TPN, NPO except ice chips and meds in pudding 5. Dispo- patient stable, continued dysphagia continue TPN, ENT eval pending   LOS: 16 days    [name], ERIN   Patient not a candidate for Coumadin, continue aspirin Advance diet cautiously, patient has had problems with aspiration patient examined and medical record reviewed,agree with above note. [name] 04/12/2015   04/12/2015

## 2015-04-12 NOTE — Anesthesia Postprocedure Evaluation (Signed)
  Anesthesia Post-op Note  Patient: [name]  Procedure(s) Performed: Procedure(s): AORTIC VALVE REPLACEMENT (AVR) using a 54mm Edwards Aortic Magna Ease Valve  (N/A) TRANSESOPHAGEAL ECHOCARDIOGRAM (TEE) (N/A) MAZE (N/A) REPLACEMENT ASCENDING AORTA with a 68mm Hemashield Platinum Graft (N/A)  Patient Location: SICU  Anesthesia Type:General  Level of Consciousness: awake, alert , oriented and patient cooperative  Airway and Oxygen Therapy: Patient Spontanous Breathing and Patient connected to nasal cannula oxygen  Post-op Pain: none  Post-op Assessment: Post-op Vital signs reviewed, Patient's Cardiovascular Status Stable, Respiratory Function Stable, Patent Airway, No signs of Nausea or vomiting and Pain level controlled, persistent hoarseness  Post-op Vital Signs: Reviewed and stable  Last Vitals:  Filed Vitals:   04/12/15 0546  BP: 155/90  Pulse: 59  Temp: 36.3 C  Resp: 18    Complications: persistent hoarseness, ENT eval pending

## 2015-04-12 NOTE — Progress Notes (Addendum)
CARDIAC REHAB PHASE I   PRE:  Rate/Rhythm: 79 ? A flutter  BP:  Sitting: 150/85        SaO2: 96 2L  MODE:  Ambulation:  440 ft   POST:  Rate/Rhythm: 105 ? A.fib/flutter  BP:  Sitting: 164/86         SaO2: 90 2L  Pt ambulated 440 ft on 2L O2, hand held assist, rollator, IV, steady gait, tolerated well.  Pt denies fatigue but declined to ambulate farther at this time. Pt is very focused on his "secretions" and spent approximately 10 minutes standing at the sink self-suctioning with yaunker prior to ambulation. Pt returned to chair, recommended elevating feet, pt states he wishes to "stand" at this time. Reminded pt to use call light. Encouraged IS, flutter valve and ambulation as tolerated. Pt verbalized understanding.   [phone]   Lenna Sciara, RN, BSN 04/12/2015 10:35 AM

## 2015-04-12 NOTE — Consult Note (Signed)
Reason for Consult: Trouble with voice and swallowing Referring Physician: [name], MD  [name] is an 54 y.o. male.  HPI: Coronary bypass surgery earlier in the week, trouble with weak voice and some trouble swallowing since then.  Past Medical History  Diagnosis Date  . Insomnia   . CHF (congestive heart failure)   . Congenital insufficiency of aortic valve   . Congestive cardiomyopathy   . Atrial flutter   . Benign hypertensive heart and renal disease   . Essential hypertension, malignant   . SOB (shortness of breath)   . Murmur   . Aortic stenosis 11/19/2014  . 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 Benign essential HTN 11/19/2014  . Chronic systolic CHF (congestive heart failure) 11/19/2014    Past Surgical History  Procedure Laterality Date  . Left and right heart catheterization with coronary angiogram N/A 11/27/2014    Procedure: LEFT AND RIGHT HEART CATHETERIZATION WITH CORONARY ANGIOGRAM;  Surgeon: [name], MD;  Location: [facility] CATH LAB;  Service: Cardiovascular;  Laterality: N/A;  . Aortic valve replacement N/A 04/05/2015    Procedure: AORTIC VALVE REPLACEMENT (AVR) using a 22mm Edwards Aortic Magna Ease Valve ;  Surgeon: [name], MD;  Location: [place];  Service: Open Heart Surgery;  Laterality: N/A;  . Tee without cardioversion N/A 04/05/2015    Procedure: TRANSESOPHAGEAL ECHOCARDIOGRAM (TEE);  Surgeon: [name], MD;  Location: [place];  Service: Open Heart Surgery;  Laterality: N/A;  . Maze N/A 04/05/2015    Procedure: MAZE;  Surgeon: [name], MD;  Location: [place];  Service: Open Heart Surgery;  Laterality: N/A;  . Replacement ascending aorta N/A 04/05/2015    Procedure: REPLACEMENT ASCENDING AORTA with a 61mm Hemashield Platinum Graft;  Surgeon: [name], MD;  Location: [place];  Service: Open Heart Surgery;  Laterality: N/A;    Family History  Problem Relation Age of Onset  . Heart attack Neg Hx     Social  History:  reports that he has never smoked. He does not have any smokeless tobacco history on file. His alcohol and drug histories are not on file.  Allergies: No Known Allergies  Medications: Reviewed  Results for orders placed or performed during the hospital encounter of 03/27/15 (from the past 48 hour(s))  I-STAT, chem 8     Status: Abnormal   Collection Time: 04/10/15  5:20 PM  Result Value Ref Range   Sodium 142 135 - 145 mmol/L   Potassium 3.9 3.5 - 5.1 mmol/L   Chloride 102 96 - 112 mmol/L   BUN 29 (H) 6 - 23 mg/dL   Creatinine, Ser 1.20 0.50 - 1.35 mg/dL   Glucose, Bld 112 (H) 70 - 99 mg/dL   Calcium, Ion 1.19 1.12 - 1.23 mmol/L   TCO2 25 0 - 100 mmol/L   Hemoglobin 10.5 (L) 13.0 - 17.0 g/dL   HCT 31.0 (L) 39.0 - 52.0 %  CBC     Status: Abnormal   Collection Time: 04/11/15  3:31 AM  Result Value Ref Range   WBC 16.6 (H) 4.0 - 10.5 K/uL   RBC 3.50 (L) 4.22 - 5.81 MIL/uL   Hemoglobin 9.3 (L) 13.0 - 17.0 g/dL   HCT 28.1 (L) 39.0 - 52.0 %   MCV 80.3 78.0 - 100.0 fL   MCH 26.6 26.0 - 34.0 pg   MCHC 33.1 30.0 - 36.0 g/dL   RDW 15.2 11.5 - 15.5 %   Platelets 308  150 - 400 K/uL  Comprehensive metabolic panel     Status: Abnormal   Collection Time: 04/11/15  3:31 AM  Result Value Ref Range   Sodium 142 135 - 145 mmol/L   Potassium 4.1 3.5 - 5.1 mmol/L   Chloride 104 96 - 112 mmol/L   CO2 27 19 - 32 mmol/L   Glucose, Bld 126 (H) 70 - 99 mg/dL   BUN 29 (H) 6 - 23 mg/dL   Creatinine, Ser 1.27 0.50 - 1.35 mg/dL   Calcium 8.8 8.4 - 10.5 mg/dL   Total Protein 6.4 6.0 - 8.3 g/dL   Albumin 2.6 (L) 3.5 - 5.2 g/dL   AST 32 0 - 37 U/L   ALT 40 0 - 53 U/L   Alkaline Phosphatase 91 39 - 117 U/L   Total Bilirubin 4.9 (H) 0.3 - 1.2 mg/dL   GFR calc non Af Amer 62 (L) >90 mL/min   GFR calc Af Amer 72 (L) >90 mL/min    Comment: (NOTE) The eGFR has been calculated using the CKD EPI equation. This calculation has not been validated in all clinical situations. eGFR's persistently  <90 mL/min signify possible Chronic Kidney Disease.    Anion gap 11 5 - 15  Glucose, capillary     Status: Abnormal   Collection Time: 04/11/15 11:19 PM  Result Value Ref Range   Glucose-Capillary 142 (H) 70 - 99 mg/dL  Glucose, capillary     Status: Abnormal   Collection Time: 04/12/15  5:43 AM  Result Value Ref Range   Glucose-Capillary 137 (H) 70 - 99 mg/dL  Triglycerides     Status: None   Collection Time: 04/12/15  5:44 AM  Result Value Ref Range   Triglycerides 113 <150 mg/dL  CBC     Status: Abnormal   Collection Time: 04/12/15  5:45 AM  Result Value Ref Range   WBC 16.4 (H) 4.0 - 10.5 K/uL   RBC 3.45 (L) 4.22 - 5.81 MIL/uL   Hemoglobin 9.2 (L) 13.0 - 17.0 g/dL   HCT 28.8 (L) 39.0 - 52.0 %   MCV 83.5 78.0 - 100.0 fL   MCH 26.7 26.0 - 34.0 pg   MCHC 31.9 30.0 - 36.0 g/dL   RDW 15.7 (H) 11.5 - 15.5 %   Platelets 377 150 - 400 K/uL  Comprehensive metabolic panel     Status: Abnormal   Collection Time: 04/12/15  5:45 AM  Result Value Ref Range   Sodium 143 135 - 145 mmol/L   Potassium 4.0 3.5 - 5.1 mmol/L   Chloride 105 96 - 112 mmol/L   CO2 29 19 - 32 mmol/L   Glucose, Bld 143 (H) 70 - 99 mg/dL   BUN 26 (H) 6 - 23 mg/dL   Creatinine, Ser 1.21 0.50 - 1.35 mg/dL   Calcium 8.7 8.4 - 10.5 mg/dL   Total Protein 6.1 6.0 - 8.3 g/dL   Albumin 2.5 (L) 3.5 - 5.2 g/dL   AST 51 (H) 0 - 37 U/L   ALT 46 0 - 53 U/L   Alkaline Phosphatase 191 (H) 39 - 117 U/L   Total Bilirubin 4.0 (H) 0.3 - 1.2 mg/dL   GFR calc non Af Amer 66 (L) >90 mL/min   GFR calc Af Amer 77 (L) >90 mL/min    Comment: (NOTE) The eGFR has been calculated using the CKD EPI equation. This calculation has not been validated in all clinical situations. eGFR's persistently <90 mL/min signify possible Chronic Kidney Disease.  Anion gap 9 5 - 15  Magnesium     Status: None   Collection Time: 04/12/15  5:45 AM  Result Value Ref Range   Magnesium 2.5 1.5 - 2.5 mg/dL  Phosphorus     Status: None   Collection  Time: 04/12/15  5:45 AM  Result Value Ref Range   Phosphorus 3.7 2.3 - 4.6 mg/dL  Differential     Status: Abnormal   Collection Time: 04/12/15  5:45 AM  Result Value Ref Range   Neutrophils Relative % 80 (H) 43 - 77 %   Neutro Abs 13.1 (H) 1.7 - 7.7 K/uL   Lymphocytes Relative 9 (L) 12 - 46 %   Lymphs Abs 1.4 0.7 - 4.0 K/uL   Monocytes Relative 11 3 - 12 %   Monocytes Absolute 1.8 (H) 0.1 - 1.0 K/uL   Eosinophils Relative 0 0 - 5 %   Eosinophils Absolute 0.1 0.0 - 0.7 K/uL   Basophils Relative 0 0 - 1 %   Basophils Absolute 0.0 0.0 - 0.1 K/uL    Dg Chest 2 View  04/11/2015   CLINICAL DATA:  Aortic valve replacement. Followup chest radiograph.  EXAM: CHEST  2 VIEW  COMPARISON:  04/10/2015.  FINDINGS: Support apparatus: Unchanged RIGHT IJ vascular sheath. Monitoring leads project over the chest.  Cardiomediastinal Silhouette: Enlarged but unchanged. Bioprosthetic aortic valve. Tortuous thoracic aorta.  Lungs: Discoid atelectasis at the RIGHT lung base. Dense retrocardiac opacification, likely representing atelectasis and probable effusion. No visible pneumothorax.  Effusions: RIGHT costophrenic angle sharp. No definite RIGHT pleural effusion.  Other: Staples overlie the RIGHT chest. Surgical clips are present in the RIGHT hemithorax.  IMPRESSION: Little if any interval change compared to yesterday's examination. Cardiomegaly and aortic valve replacement. Bilateral basilar opacity most compatible with atelectasis.   Electronically Signed   By: Dereck Ligas M.D.   On: 04/11/2015 10:16   Dg Chest Port 1 View  04/11/2015   CLINICAL DATA:  Central line placement.  EXAM: PORTABLE CHEST - 1 VIEW  COMPARISON:  04/11/2015.  FINDINGS: New RIGHT upper extremity PICC is present. The tip is at the junction of the superior vena cava and RIGHT atrium. Median sternotomy and aortic valve replacement. Discoid atelectasis the RIGHT lung base. Monitoring leads project over the chest. RIGHT IJ vascular sheath has  been retracted. Cardiomegaly remains present.  IMPRESSION: New RIGHT upper extremity PICC with the tip at the junction of the superior vena cava and RIGHT atrium.   Electronically Signed   By: Dereck Ligas M.D.   On: 04/11/2015 17:21    YBO:FBPZWCHE except as listed in admit H&P  Blood pressure 155/90, pulse 59, temperature 97.3 F (36.3 C), temperature source Oral, resp. rate 18, height $RemoveBe'5\' 11"'IHvXpjdex$  (1.803 m), weight 100.2 kg (220 lb 14.4 oz), SpO2 100 %.  PHYSICAL EXAM: Overall appearance:  Healthy appearing, in no distress Head:  Normocephalic, atraumatic. Ears: External ears are healthy. Nose: External nose is healthy in appearance. Internal nasal exam free of any lesions or obstruction. Oral Cavity:  There are no mucosal lesions or masses identified. Oral Pharynx/Hypopharynx/Larynx: no signs of any mucosal lesions or masses identified.   Neuro:  No identifiable neurologic deficits other than the voice. Neck: No palpable neck masses. Right IJ bandage in place. Voice is weak and breathy. Minimal glottic stop when coughing.  Studies Reviewed: none  Procedures: Flexible fiberoptic laryngoscopy was performed following topical viscous lidocaine application intranasally. There are no mucosal lesions identified in the nasopharynx, oropharynx,  hypopharynx, larynx. There is pooling of secretions in the pyriform sinus bilaterally. There is complete paralysis of the left vocal cord in the paramedian position.   Assessment/Plan: Acute onset left vocal cord paralysis. This could be temporary or permanent. Recommend speech and swallowing evaluation. Avoid liquids until therapy. Follow up as outpatient with me to chart the progress to see if there is recovery. If necessary, we can perform surgical treatment.  Loyde Orth 04/12/2015, 11:01 AM

## 2015-04-12 NOTE — Progress Notes (Signed)
PT Cancellation Note  Patient Details Name: [name] MRN: [id number] DOB: [birth year]   Cancelled Treatment:    Reason Eval/Treat Not Completed: Patient declined, no reason specified. Pt clearly upset/angry on arrival. Offered opportunity to work with PT and he simply stated "now is not a good time. I'm waiting on a phone call."  When asked if he would like PT to return later today (if possible) he stated, "I don't know."    Chaddrick Brue 04/12/2015, 1:10 PM 04/12/2015  Pager [phone]

## 2015-04-12 NOTE — Clinical Social Work Note (Signed)
Patient moved to 2W21, handoff given to unit CSW, this CSW to sign off.  Jones Broom. [place], MSW, [place] 04/12/2015 8:46 AM

## 2015-04-12 NOTE — Progress Notes (Signed)
Patient given diet restrictions and instructions on eating.  Patient is to be full supervision and does want to follow instructions.  Patient will need to continue to be educated on feeding process and risk of aspiration. Pt resting with call bell within reach.  Will continue to monitor. Payton Emerald, RN

## 2015-04-12 NOTE — Progress Notes (Signed)
Encouraged patient to use flutter valve and continue to deep breathe and cough. Pt resting with call bell within reach.  Will continue to monitor. Payton Emerald, RN

## 2015-04-13 DIAGNOSIS — R7989 Other specified abnormal findings of blood chemistry: Secondary | ICD-10-CM

## 2015-04-13 LAB — COMPREHENSIVE METABOLIC PANEL
ALT: 43 U/L (ref 0–53)
AST: 63 U/L — ABNORMAL HIGH (ref 0–37)
Albumin: 2.3 g/dL — ABNORMAL LOW (ref 3.5–5.2)
Alkaline Phosphatase: 211 U/L — ABNORMAL HIGH (ref 39–117)
Anion gap: 10 (ref 5–15)
BUN: 24 mg/dL — ABNORMAL HIGH (ref 6–23)
CO2: 28 mmol/L (ref 19–32)
Calcium: 8.2 mg/dL — ABNORMAL LOW (ref 8.4–10.5)
Chloride: 103 mmol/L (ref 96–112)
Creatinine, Ser: 1.14 mg/dL (ref 0.50–1.35)
GFR calc Af Amer: 83 mL/min — ABNORMAL LOW (ref >90)
GFR calc non Af Amer: 71 mL/min — ABNORMAL LOW (ref >90)
Glucose, Bld: 133 mg/dL — ABNORMAL HIGH (ref 70–99)
Potassium: 4.4 mmol/L (ref 3.5–5.1)
Sodium: 141 mmol/L (ref 135–145)
Total Bilirubin: 3.8 mg/dL — ABNORMAL HIGH (ref 0.3–1.2)
Total Protein: 6 g/dL (ref 6.0–8.3)

## 2015-04-13 LAB — GLUCOSE, CAPILLARY
GLUCOSE-CAPILLARY: 119 mg/dL — AB (ref 70–99)
Glucose-Capillary: 130 mg/dL — ABNORMAL HIGH (ref 70–99)
Glucose-Capillary: 109 mg/dL — ABNORMAL HIGH (ref 70–99)
Glucose-Capillary: 141 mg/dL — ABNORMAL HIGH (ref 70–99)

## 2015-04-13 LAB — CBC
HCT: 26.7 % — ABNORMAL LOW (ref 39.0–52.0)
Hemoglobin: 8.6 g/dL — ABNORMAL LOW (ref 13.0–17.0)
MCH: 26.2 pg (ref 26.0–34.0)
MCHC: 32.2 g/dL (ref 30.0–36.0)
MCV: 81.4 fL (ref 78.0–100.0)
Platelets: 430 10*3/uL — ABNORMAL HIGH (ref 150–400)
RBC: 3.28 MIL/uL — ABNORMAL LOW (ref 4.22–5.81)
RDW: 15.6 % — ABNORMAL HIGH (ref 11.5–15.5)
WBC: 13.7 10*3/uL — ABNORMAL HIGH (ref 4.0–10.5)

## 2015-04-13 LAB — PREALBUMIN: Prealbumin: 10 mg/dL — ABNORMAL LOW (ref 21–43)

## 2015-04-13 MED ORDER — DIPHENHYDRAMINE HCL 25 MG PO CAPS
25.0000 mg | ORAL_CAPSULE | Freq: Every evening | ORAL | Status: DC | PRN
  Administered 2015-04-13 – 2015-04-19 (×2): 25 mg via ORAL
  Filled 2015-04-13 (×3): qty 1

## 2015-04-13 MED ORDER — CARVEDILOL 6.25 MG PO TABS
6.2500 mg | ORAL_TABLET | Freq: Two times a day (BID) | ORAL | Status: DC
  Administered 2015-04-13 – 2015-04-14 (×4): 6.25 mg via ORAL
  Filled 2015-04-13 (×7): qty 1

## 2015-04-13 MED ORDER — HYDRALAZINE HCL 10 MG PO TABS
20.0000 mg | ORAL_TABLET | Freq: Three times a day (TID) | ORAL | Status: DC
  Administered 2015-04-13 – 2015-04-21 (×17): 20 mg via ORAL
  Filled 2015-04-13 (×26): qty 2

## 2015-04-13 MED ORDER — FUROSEMIDE 10 MG/ML IJ SOLN
40.0000 mg | Freq: Once | INTRAMUSCULAR | Status: AC
  Administered 2015-04-13: 40 mg via INTRAVENOUS
  Filled 2015-04-13: qty 4

## 2015-04-13 MED ORDER — STARCH (THICKENING) PO POWD
ORAL | Status: DC | PRN
  Filled 2015-04-13: qty 227

## 2015-04-13 MED ORDER — HYDRALAZINE HCL 25 MG PO TABS
25.0000 mg | ORAL_TABLET | Freq: Three times a day (TID) | ORAL | Status: DC
  Administered 2015-04-13: 25 mg via ORAL
  Filled 2015-04-13 (×4): qty 1

## 2015-04-13 NOTE — Progress Notes (Signed)
Physical Therapy Treatment Patient Details Name: BREYDON SENTERS MRN: [id number] DOB: 02/03/1961 Today's Date: 04/13/2015    History of Present Illness Mr. Caroll Cunnington is a 54 yo man with PMH of hypertension, atrial flutter, mild AS, moderate AI and nonischemic cardiomyopathy with MUGA EF ~ 35% after LHC 15-20% after being hospitalized several times for CHF having 12/11-12/12/15 admission for RHC/LHC. He presents today with 2 weeks of progressive shortness of breath. No infectious symptoms - no fever/chills/nausea/vomiting/diarrhea. He has 20 feet dyspnea on exertion. He tells me he sleeps on 8 pillows/recliner. He wakes up SOB from sleep. He received IV lasix in the ER. ECG noted to be in atrial flutter with BNP 2700 and Trop 0.1. No current chest pain.     PT Comments    Pt making good progress, but does demonstrate decreased safety at times and pt needs cues to stay on task.  Pt may be ready for gait trial without RW next session.  Wife and sister present and very involved.  Follow Up Recommendations  SNF;Other (comment) (pt reports that is where he will going)     Equipment Recommendations  None recommended by PT    Recommendations for Other Services       Precautions / Restrictions Precautions Precautions: Fall Restrictions Weight Bearing Restrictions: No    Mobility  Bed Mobility               General bed mobility comments: OOB in recliner  Transfers   Equipment used: Rolling walker (2 wheeled) Transfers: Sit to/from Stand Sit to Stand: Supervision         General transfer comment: no unsteadiness noted upon standing  Ambulation/Gait Ambulation/Gait assistance: Min guard Ambulation Distance (Feet): 300 Feet Assistive device: Rolling walker (2 wheeled) Gait Pattern/deviations: Step-through pattern Gait velocity: quick with decreased safety at times   General Gait Details: no LOB, but cues for pacing self and for safety   Stairs             Wheelchair Mobility    Modified Rankin (Stroke Patients Only)       Balance           Standing balance support: No upper extremity supported Standing balance-Leahy Scale: Fair                      Cognition Arousal/Alertness: Awake/alert   Overall Cognitive Status: Impaired/Different from baseline Area of Impairment: Safety/judgement         Safety/Judgement: Decreased awareness of safety          Exercises General Exercises - Lower Extremity Ankle Circles/Pumps: AROM;Both;10 reps;Seated Long Arc Quad: Strengthening;Both;10 reps;Seated Hip ABduction/ADduction: Strengthening;Both;10 reps;Seated Hip Flexion/Marching: Strengthening;5 reps;Both    General Comments General comments (skin integrity, edema, etc.): Pt easily distracted. Prior to gait, pt letting go of RW to "stretch" his legs.  Decreased safety awareness.      Pertinent Vitals/Pain Pain Assessment: No/denies pain    Home Living                      Prior Function            PT Goals (current goals can now be found in the care plan section) Acute Rehab PT Goals Patient Stated Goal: Get back home PT Goal Formulation: With patient Time For Goal Achievement: 04/22/15 Potential to Achieve Goals: Good Progress towards PT goals: Progressing toward goals    Frequency  Min 3X/week    PT  Plan Current plan remains appropriate    Co-evaluation             End of Session Equipment Utilized During Treatment: Oxygen Activity Tolerance: Patient tolerated treatment well Patient left: in chair;with call bell/phone within reach;with family/visitor present     Time: 6440-3474 PT Time Calculation (min) (ACUTE ONLY): 36 min  Charges:  $Gait Training: 8-22 mins $Therapeutic Exercise: 8-22 mins                    G Codes:      Halynn Reitano LUBECK 04/13/2015, 2:21 PM

## 2015-04-13 NOTE — Clinical Social Work Note (Signed)
CSW met with pt and sister Kennyth Lose and gave bed offers.  Pt sister requested that [employer] fax referral to [facility] in [place] faxed clinicals for review.  CSW will continue to follow.  Domenica Reamer, [place] Social Worker [phone]

## 2015-04-13 NOTE — Progress Notes (Addendum)
      [address]       [place] [address]             [phone]      8 Days Post-Op Procedure(s) (LRB): AORTIC VALVE REPLACEMENT (AVR) using a 57mm Edwards Aortic Magna Ease Valve  (N/A) TRANSESOPHAGEAL ECHOCARDIOGRAM (TEE) (N/A) MAZE (N/A) REPLACEMENT ASCENDING AORTA with a 56mm Hemashield Platinum Graft (N/A)   Subjective:  Mr. Nix is feeling better this morning.  He continues to be frustrated with his voice and inability to eat normally.  He is ambulating.  No BM  Objective: Vital signs in last 24 hours: Temp:  [97.7 F (36.5 C)-98.7 F (37.1 C)] 98.7 F (37.1 C) (04/26 0349) Pulse Rate:  [75-79] 79 (04/26 0349) Cardiac Rhythm:  [-]  Resp:  [16-18] 18 (04/26 0349) BP: (147-152)/(94-98) 152/98 mmHg (04/26 0349) SpO2:  [97 %-99 %] 99 % (04/26 0349) Weight:  [226 lb 3.1 oz (102.6 kg)] 226 lb 3.1 oz (102.6 kg) (04/26 0349)  Intake/Output from previous day: 04/25 0701 - 04/26 0700 In: 10 [I.V.:10] Out: 950 [Urine:950]  General appearance: alert, cooperative and no distress Heart: regular rate and rhythm Lungs: clear to auscultation bilaterally Abdomen: soft, non-tender; bowel sounds normal; no masses,  no organomegaly Extremities: edema 1+ Wound: clean and dry  Lab Results:  Recent Labs  04/12/15 0545 04/13/15 0415  WBC 16.4* 13.7*  HGB 9.2* 8.6*  HCT 28.8* 26.7*  PLT 377 430*   BMET:  Recent Labs  04/12/15 0545 04/13/15 0415  NA 143 141  K 4.0 4.4  CL 105 103  CO2 29 28  GLUCOSE 143* 133*  BUN 26* 24*  CREATININE 1.21 1.14  CALCIUM 8.7 8.2*    PT/INR: No results for input(s): LABPROT, INR in the last 72 hours. ABG    Component Value Date/Time   PHART 7.403 04/07/2015 1524   HCO3 24.0 04/07/2015 1524   TCO2 25 04/10/2015 1720   ACIDBASEDEF 1.0 04/07/2015 1524   O2SAT 64.9 04/09/2015 0325   CBG (last 3)   Recent Labs  04/12/15 1650 04/12/15 2109 04/13/15 0604  GLUCAP 161* 127* 130*    Assessment/Plan: S/P  Procedure(s) (LRB): AORTIC VALVE REPLACEMENT (AVR) using a 38mm Edwards Aortic Magna Ease Valve  (N/A) TRANSESOPHAGEAL ECHOCARDIOGRAM (TEE) (N/A) MAZE (N/A) REPLACEMENT ASCENDING AORTA with a 35mm Hemashield Platinum Graft (N/A)  1. CV- NSR, remains hypertensive- will transition IV medications to oral- start Coreg at 6.25 BID (home was 25) which is home medication, and Hydralazine tablets at 25 Q8 (home was 37.5), continue Clonidine patch for now 2. Pulm- off oxygen, continue IS 3. Renal- creatinine minimally elevated at 1.14, remains hypervolemic will continue Lasix 4. ENT- Vocal cord paralysis, continue SSLP care, soft diet, thickened liquids/ TPN discontinued 5. Dispo- patient stable, diet per SSLP, monitor hypertension adjust medications as tolerated   LOS: 17 days    [name], ERIN 04/13/2015   D-3 diet, stop TNA Not candidate for coumadin prob RLL aspiration pneumonia- cont IV antibiotics

## 2015-04-13 NOTE — Progress Notes (Signed)
Came to ambulate however apparently he just walked with PT. Assisted pt to get comfortable in chair and answered questions from family. Moving well but seems tired. [place] CES, ACSM 2:16 PM 04/13/2015

## 2015-04-13 NOTE — Progress Notes (Signed)
Utilization review completed.  

## 2015-04-13 NOTE — Progress Notes (Signed)
PARENTERAL NUTRITION CONSULT NOTE - FOLLOW UP  Pharmacy Consult for TPN Indication: Severe dysphagia   No Known Allergies  Patient Measurements: Height: 5\' 11"  (180.3 cm) Weight: 226 lb 3.1 oz (102.6 kg) IBW/kg (Calculated) : 75.3 Adjusted Body Weight: 82 kg   Vital Signs: Temp: 98.7 F (37.1 C) (04/26 0349) Temp Source: Oral (04/26 0349) BP: 152/98 mmHg (04/26 0349) Pulse Rate: 79 (04/26 0349) Intake/Output from previous day: 04/25 0701 - 04/26 0700 In: 10 [I.V.:10] Out: 950 [Urine:950] Intake/Output from this shift:    Labs:  Recent Labs  04/11/15 0331 04/12/15 0545 04/13/15 0415  WBC 16.6* 16.4* 13.7*  HGB 9.3* 9.2* 8.6*  HCT 28.1* 28.8* 26.7*  PLT 308 377 430*     Recent Labs  04/11/15 0331 04/12/15 0544 04/12/15 0545 04/13/15 0415  NA 142  --  143 141  K 4.1  --  4.0 4.4  CL 104  --  105 103  CO2 27  --  29 28  GLUCOSE 126*  --  143* 133*  BUN 29*  --  26* 24*  CREATININE 1.27  --  1.21 1.14  CALCIUM 8.8  --  8.7 8.2*  MG  --   --  2.5  --   PHOS  --   --  3.7  --   PROT 6.4  --  6.1 6.0  ALBUMIN 2.6*  --  2.5* 2.3*  AST 32  --  51* 63*  ALT 40  --  46 43  ALKPHOS 91  --  191* 211*  BILITOT 4.9*  --  4.0* 3.8*  PREALBUMIN  --   --  10*  --   TRIG  --  113  --   --    Estimated Creatinine Clearance: 90.3 mL/min (by C-G formula based on Cr of 1.14).    Recent Labs  04/12/15 1650 04/12/15 2109 04/13/15 0604  GLUCAP 161* 127* 130*    Medical History: Past Medical History  Diagnosis Date  . Insomnia   . CHF (congestive heart failure)   . Congenital insufficiency of aortic valve   . Congestive cardiomyopathy   . Atrial flutter   . Benign hypertensive heart and renal disease   . Essential hypertension, malignant   . SOB (shortness of breath)   . Murmur   . Aortic stenosis 11/19/2014  . 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 Benign essential HTN 11/19/2014  . Chronic systolic CHF (congestive heart failure) 11/19/2014     Medications:  Prescriptions prior to admission  Medication Sig Dispense Refill Last Dose  . albuterol (PROVENTIL HFA;VENTOLIN HFA) 108 (90 BASE) MCG/ACT inhaler Inhale 2 puffs into the lungs every 4 (four) hours as needed for wheezing or shortness of breath.   03/27/2015 at Unknown time  . aspirin 81 MG tablet Take 81 mg by mouth daily.   03/27/2015 at Unknown time  . atorvastatin (LIPITOR) 40 MG tablet Take 40 mg by mouth daily.   03/27/2015 at Unknown time  . carvedilol (COREG) 25 MG tablet Take 1 tablet (25 mg total) by mouth 2 (two) times daily with a meal. 60 tablet 6 03/27/2015 at 200 pm  . clopidogrel (PLAVIX) 75 MG tablet Take 75 mg by mouth daily.   03/27/2015 at Unknown time  . furosemide (LASIX) 40 MG tablet Take 40 mg by mouth 2 (two) times daily.    03/27/2015 at Unknown time  . hydrALAZINE (APRESOLINE) 25 MG tablet Take 37.5 mg by mouth 3 (three) times daily.  03/27/2015 at Unknown time  . potassium chloride SA (KLOR-CON M20) 20 MEQ tablet Take 1 tablet (20 mEq total) by mouth daily. 30 tablet 1 03/27/2015 at Unknown time  . ranitidine (ZANTAC) 150 MG capsule Take 150 mg by mouth daily.   03/27/2015 at Unknown time  . vitamin B-12 (CYANOCOBALAMIN) 500 MCG tablet Take 500 mcg by mouth daily.   03/27/2015 at Unknown time    Insulin Requirements in the past 24 hours:  4 unit of SSI  Assessment: 22 YOM with an extensive cardiac history presented to the ED on 4/9 with progressive shortness of breath from acute on chronic HF. A 2D Echo showed severe aortic insufficiency and LV dysfunction and underwent aortic valve replacement, MAZE and aorta graft between 4/18 and 4/21. After the procedures, the patient remained very hoarse and unable to swallow safely which was deemed to have likely be caused by tube trauma. Pharmacy consulted to start TPN while requesting an ENT eval.  Surgeries/Procedures: AVR, MAZE, aorta graft   GI: Swallow eval on 4/23 confirmed patient is at risk for aspiration. Albumin is  2.3. Last bowel movement on 4/23. ENT saw the patient early yesterday and recommended patient avoid liquids and follow up outpatient. Diet was then advanced to Dysphagia 3 after speech eval. Pt reports wanting to eat and nurse reports good appetite overnight eating all of food.  Endo: No h/o DM, CBGs 120-160s  Lytes: K 4.4, Phos 3.7 (4/25), Mg 2.5 (4/25), CorrCa 9.6  Renal: SCr down to 1.14 (BL~ 0.8 -1 ). UOP 0.$Remove'7mg'kpDkDmn$ /kg/hr yesterday. I/Os not charted appropriately.  Pulm: 2L [place]   Cards: H/o CHF, Aflutter, HTN, aortic valve insufficiency. HR ok, BP slightly elevated  Hepatobil: Total Bili 6.5 >> 3.8 (BL ~ 1.6). No signs of jaundice. AST slightly elevated and ALT wnl. Alk Phos increasing to 211. Triglycerides 113  ID: WBC decreased to 13.7, On Fortaz for empiric pneumonia coverage   Best Practices: Lovenox, PPI  TPN Access: PICC double lumen  TPN start date: 4/24>>  Nutritional Goals:  2300-2500kcal and 100-120 g of protein F/U RD recs for goals  Current Nutrition:  NPO Clinimix E 5/15 to 70 mL/hr and IVFE @ 10 mL/hr. This will provide 1672 kcal and 84 g of protein which will meet 70% of kcal needs and 76% of protein needs  Plan:  - Decrease Climix to 57ml/hr for 2 hours, then stop. Stop IVFE. - D/C TPN orders and labs - Change SSI and CBGs to TIDwc and QHS  Elenor Quinones, PharmD Clinical Pharmacist Resident Pager [phone] 04/13/2015 7:22 AM

## 2015-04-13 NOTE — Progress Notes (Signed)
Speech Language Pathology Treatment: Dysphagia  Patient Details Name: [name] MRN: [id number] DOB: 28-Aug-1961 Today's Date: 04/13/2015 Time: 0737-1062 SLP Time Calculation (min) (ACUTE ONLY): 29 min  Assessment / Plan / Recommendation Clinical Impression  SLP provided skilled treatment to reinforce strategies for swallowing. Pt observed to have thin orange juice with ice upon arrival, coughing and clearing his throat frequently. Discussed rationale for ice chips only after oral care and meals, not mixed with juice and drank from a straw. Sister and pt verbalized understanding. With moderate verbal and visual cues to complete head turn, chin tuck and supra-supra glottic swallow, pt was able to complete and return demonstrate fading to min verbal cues from therapist or sister. Pt refused solid trials. Will f/u for further reinforcement.    HPI HPI:  54 y.o. man underwent aortic valve replacment 4/18.  Pt was intubated for surgery and extubated within 24 hours,on 4/19.  PMH:  obesity, HTN, sliding Hiatal Hernia with reflux.  Clinical swallow evaluation on 4/20 revealed dysphagia with concerns for aspiration.  Orders for FEES 04/09/15.    Pertinent Vitals Pain Assessment: No/denies pain  SLP Plan  Continue with current plan of care    Recommendations Diet recommendations: Honey-thick liquid;Dysphagia 3 (mechanical soft) Liquids provided via: Cup Medication Administration: Crushed with puree Supervision: Full supervision/cueing for compensatory strategies Compensations: Small sips/bites;Multiple dry swallows after each bite/sip;Hard cough after swallow Postural Changes and/or Swallow Maneuvers: Hold breath during swallow, followed by a cough (Super supraglottic swallow);Head turn left during swallow;Chin tuck              Oral Care Recommendations: Oral care BID Follow up Recommendations: Inpatient Rehab Plan: Continue with current plan of care    GO    [facility], MA CCC-SLP  [phone]  Lynann Beaver 04/13/2015, 10:17 AM

## 2015-04-14 ENCOUNTER — Inpatient Hospital Stay (HOSPITAL_COMMUNITY): Payer: Medicaid Other

## 2015-04-14 LAB — BASIC METABOLIC PANEL
Anion gap: 8 (ref 5–15)
BUN: 20 mg/dL (ref 6–23)
CO2: 28 mmol/L (ref 19–32)
Calcium: 8.6 mg/dL (ref 8.4–10.5)
Chloride: 102 mmol/L (ref 96–112)
Creatinine, Ser: 1.15 mg/dL (ref 0.50–1.35)
GFR calc Af Amer: 82 mL/min — ABNORMAL LOW (ref >90)
GFR calc non Af Amer: 70 mL/min — ABNORMAL LOW (ref >90)
Glucose, Bld: 169 mg/dL — ABNORMAL HIGH (ref 70–99)
Potassium: 3.8 mmol/L (ref 3.5–5.1)
Sodium: 138 mmol/L (ref 135–145)

## 2015-04-14 LAB — GLUCOSE, CAPILLARY
GLUCOSE-CAPILLARY: 146 mg/dL — AB (ref 70–99)
GLUCOSE-CAPILLARY: 87 mg/dL (ref 70–99)
Glucose-Capillary: 109 mg/dL — ABNORMAL HIGH (ref 70–99)

## 2015-04-14 MED ORDER — POTASSIUM CHLORIDE CRYS ER 20 MEQ PO TBCR
20.0000 meq | EXTENDED_RELEASE_TABLET | Freq: Every day | ORAL | Status: DC
  Administered 2015-04-14 – 2015-04-17 (×4): 20 meq via ORAL
  Filled 2015-04-14 (×7): qty 1

## 2015-04-14 MED ORDER — AMIODARONE HCL 200 MG PO TABS
400.0000 mg | ORAL_TABLET | Freq: Two times a day (BID) | ORAL | Status: DC
  Administered 2015-04-14: 400 mg via ORAL
  Filled 2015-04-14 (×2): qty 2

## 2015-04-14 MED ORDER — PANTOPRAZOLE SODIUM 40 MG PO TBEC
40.0000 mg | DELAYED_RELEASE_TABLET | Freq: Every day | ORAL | Status: DC
  Administered 2015-04-14 – 2015-04-23 (×8): 40 mg via ORAL
  Filled 2015-04-14 (×8): qty 1

## 2015-04-14 NOTE — Progress Notes (Addendum)
NUTRITION FOLLOW UP  INTERVENTION: No nutrition intervention warranted at this time RD to continue to follow  NUTRITION DIAGNOSIS: Inadequate oral intake, resolved  Goal: Pt to meet >/= 90% of their estimated nutrition needs, met  Monitor:  PO intake, weight, labs, I/O's  ASSESSMENT: 54 yo Man with PMH of hypertension, atrial flutter, mild AS, moderate AI and nonischemic cardiomyopathy with MUGA EF ~ 35% after LHC 15-20% after being hospitalized several times for CHF having 12/11-12/12/15 admission for RHC/LHC. He presents today with 2 weeks of progressive shortness of breath. No infectious symptoms - no fever/chills/nausea/vomiting/diarrhea. He has 20 feet dyspnea on exertion. He tells me he sleeps on 8 pillows/recliner. He wakes up SOB from sleep. He received IV lasix in the ER. ECG noted to be in atrial flutter with BNP 2700 and Trop 0.1. No current chest pain.   Patient s/p procedures 4/18: AORTIC VALVE REPLACEMENT  MAZE (RIGHT ATRIAL SET) REPLACEMENT ASCENDING AORTA  Pt s/p repeat FEES 4/25.  Swallow function improved.  TPN discontinued.  Advanced to Dys 3, honey thick liquid diet.  PO intake good at 75-100% per flowsheet records.  Height: Ht Readings from Last 1 Encounters:  03/28/15 _0  (1.803 m)    Weight: Wt Readings from Last 1 Encounters:  04/14/15 229 lb 4.5 oz (104 kg)    BMI:  Body mass index is 31.99 kg/(m^2).  Estimated Nutritional Needs: Kcal: 2200-2400 Protein: 120-130 gm Fluid: per MD  Skin: chest & R axilla surgical incisions   Diet Order: DIET DYS 3 Room service appropriate?: Yes; Fluid consistency:: Honey Thick   Intake/Output Summary (Last 24 hours) at 04/14/15 1240 Last data filed at 04/14/15 1209  Gross per 24 hour  Intake    250 ml  Output   1425 ml  Net  -1175 ml    Labs:   Recent Labs Lab 04/11/15 0331 04/12/15 0545 04/13/15 0415  NA 142 143 141  K 4.1 4.0 4.4  CL 104 105 103  CO2 _1 BUN 29* 26* 24*  CREATININE  1.27 1.21 1.14  CALCIUM 8.8 8.7 8.2*  MG  --  2.5  --   PHOS  --  3.7  --   GLUCOSE 126* 143* 133*    CBG (last 3)   Recent Labs  04/13/15 2200 04/14/15 0702 04/14/15 1140  GLUCAP 119* 109* 146*    Scheduled Meds: . amiodarone  400 mg Oral BID  . antiseptic oral rinse  7 mL Mouth Rinse q12n4p  . aspirin EC  325 mg Oral Daily   Or  . aspirin  324 mg Per Tube Daily  . atorvastatin  40 mg Oral Daily  . bisacodyl  10 mg Oral Daily   Or  . bisacodyl  10 mg Rectal Daily  . budesonide (PULMICORT) nebulizer solution  0.5 mg Nebulization BID  . carvedilol  6.25 mg Oral BID WC  . cefTAZidime (FORTAZ)  IV  1 g Intravenous Q8H  . chlorhexidine  15 mL Mouth Rinse BID  . docusate sodium  200 mg Oral Daily  . enoxaparin (LOVENOX) injection  40 mg Subcutaneous Q24H  . hydrALAZINE  20 mg Oral 3 times per day  . insulin aspart  0-9 Units Subcutaneous TID WC  . levalbuterol  0.63 mg Nebulization TID  . lidocaine  15 mL Mouth/Throat Once  . pantoprazole (PROTONIX) IV  40 mg Intravenous Q24H  . sodium chloride  10-40 mL Intracatheter Q12H  . sodium chloride  3 mL Intravenous  Q12H    Continuous Infusions: . sodium chloride Stopped (04/09/15 1056)  . sodium chloride 10 mL/hr (04/12/15 1730)  . sodium chloride 50 mL/hr at 04/13/15 2330  . lactated ringers Stopped (04/11/15 1258)  . lactated ringers Stopped (04/08/15 2000)    Past Medical History  Diagnosis Date  . Insomnia   . CHF (congestive heart failure)   . Congenital insufficiency of aortic valve   . Congestive cardiomyopathy   . Atrial flutter   . Benign hypertensive heart and renal disease   . Essential hypertension, malignant   . SOB (shortness of breath)   . Murmur   . Aortic stenosis 11/19/2014  . 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 Benign essential HTN 11/19/2014  . Chronic systolic CHF (congestive heart failure) 11/19/2014    Past Surgical History  Procedure Laterality Date  . Left and right heart  catheterization with coronary angiogram N/A 11/27/2014    Procedure: LEFT AND RIGHT HEART CATHETERIZATION WITH CORONARY ANGIOGRAM;  Surgeon: [name], MD;  Location: [facility] CATH LAB;  Service: Cardiovascular;  Laterality: N/A;  . Aortic valve replacement N/A 04/05/2015    Procedure: AORTIC VALVE REPLACEMENT (AVR) using a 62m Edwards Aortic Magna Ease Valve ;  Surgeon: PIvin Poot MD;  [place] [facility]  Service: Open Heart Surgery;  Laterality: N/A;  . Tee without cardioversion N/A 04/05/2015    Procedure: TRANSESOPHAGEAL ECHOCARDIOGRAM (TEE);  Surgeon: PIvin Poot MD;  [place] [facility]  Service: Open Heart Surgery;  Laterality: N/A;  . Maze N/A 04/05/2015    Procedure: MAZE;  Surgeon: PIvin Poot MD;  [facility] [facility]  Service: Open Heart Surgery;  Laterality: N/A;  . Replacement ascending aorta N/A 04/05/2015    Procedure: REPLACEMENT ASCENDING AORTA with a 384mHemashield Platinum Graft;  Surgeon: PeIvin PootMD;  [place] [facility] Service: Open Heart Surgery;  Laterality: N/A;    KaArthur HolmsRD, LDN Pager #: [phone]-Hours Pager #: [phone]

## 2015-04-14 NOTE — Progress Notes (Signed)
CSW spoke with pt wife this morning to discuss placement.  Pt wife is hopeful for a facility in [place] followed up with 3 facilities in [facility] and there is no availability at this time for Medicaid patients.  CSW also made referrals to 4 facilities in [place]- no bed offers at this time.  Pt does have [place] bed offers but family wants [place] or [place] due to proximity to pt spouse and or pt sister.  CSW will continue to follow.  [name], [place] Social Worker [phone]

## 2015-04-14 NOTE — Progress Notes (Signed)
Speech Language Pathology Treatment: Dysphagia  Patient Details Name: [name] MRN: [id number] DOB: Nov 15, 1961 Today's Date: 04/14/2015 Time: 6861-6837 SLP Time Calculation (min) (ACUTE ONLY): 17 min  Assessment / Plan / Recommendation Clinical Impression  Pt demonstrates slight improvement in vocal quality today, subjectively. Pt states "oh I can do the swallow thing!" and does utilize strategies correctly in 100% of trials with honey thick liquids. Pt coughs as instructed, but also noted to have appearance of sensed cough occasionally as well. Would recommend repeat swallow study just prior to d/c to determine if diet could be liberalized. Will follow closely to schedule test.    HPI HPI:  54 y.o. man underwent aortic valve replacment 4/18.  Pt was intubated for surgery and extubated within 24 hours,on 4/19.  PMH:  obesity, HTN, sliding Hiatal Hernia with reflux.  Clinical swallow evaluation on 4/20 revealed dysphagia with concerns for aspiration.  Orders for FEES 04/09/15.    Pertinent Vitals Pain Assessment: No/denies pain  SLP Plan  MBS    Recommendations Diet recommendations: Honey-thick liquid;Dysphagia 3 (mechanical soft) Liquids provided via: Cup Medication Administration: Crushed with puree Supervision: Full supervision/cueing for compensatory strategies Compensations: Small sips/bites;Multiple dry swallows after each bite/sip;Hard cough after swallow Postural Changes and/or Swallow Maneuvers: Hold breath during swallow, followed by a cough (Super supraglottic swallow);Head turn left during swallow;Chin tuck              Oral Care Recommendations: Oral care BID Follow up Recommendations: Skilled Nursing facility Plan: [name], [place] CCC-SLP [phone]  [name] 04/14/2015, 11:14 AM

## 2015-04-14 NOTE — Progress Notes (Signed)
Per Dr. Montine Circle, pacer wires pulled after pt educated regarding procedure. Sutures removed, wires pulled and intact, pt tolerated well, v/s taken per protocol. See flowsheet. Cato Mulligan RN

## 2015-04-14 NOTE — Progress Notes (Addendum)
[address]       [place],[name] [address]             [phone]          9 Days Post-Op Procedure(s) (LRB): AORTIC VALVE REPLACEMENT (AVR) using a 67mm Edwards Aortic Magna Ease Valve  (N/A) TRANSESOPHAGEAL ECHOCARDIOGRAM (TEE) (N/A) MAZE (N/A) REPLACEMENT ASCENDING AORTA with a 72mm Hemashield Platinum Graft (N/A)  Subjective: Still with productive cough.  Appetite fair, bowels working.   Objective: Vital signs in last 24 hours: Patient Vitals for the past 24 hrs:  BP Temp Temp src Pulse Resp SpO2 Weight  04/14/15 0546 139/85 mmHg 98.9 F (37.2 C) Oral (!) 58 18 99 % 229 lb 4.5 oz (104 kg)  04/13/15 2319 117/87 mmHg - - 60 - - -  04/13/15 2124 123/78 mmHg - - (!) 57 - - -  04/13/15 2112 - - - 73 16 97 % -  04/13/15 2011 121/85 mmHg 98.1 F (36.7 C) Oral 60 16 95 % -  04/13/15 1500 107/75 mmHg 98.6 F (37 C) - 63 - 95 % -  04/13/15 1413 - - - - - 100 % -  04/13/15 0900 124/85 mmHg - - 84 - - -  04/13/15 0849 - - - - - 98 % -   Current Weight  04/14/15 229 lb 4.5 oz (104 kg)  PRE-OPERATIVE WEIGHT: 101kg   Intake/Output from previous day: 04/26 0701 - 04/27 0700 In: 480 [P.O.:480] Out: 1825 [Urine:1825]  CBGs [phone]   PHYSICAL EXAM:  Heart: RRR Lungs: Coarse rhonchi bilaterally Wound: Clean and dry Extremities: + LE edema    Lab Results: CBC: Recent Labs  04/12/15 0545 04/13/15 0415  WBC 16.4* 13.7*  HGB 9.2* 8.6*  HCT 28.8* 26.7*  PLT 377 430*   BMET:  Recent Labs  04/12/15 0545 04/13/15 0415  NA 143 141  K 4.0 4.4  CL 105 103  CO2 29 28  GLUCOSE 143* 133*  BUN 26* 24*  CREATININE 1.21 1.14  CALCIUM 8.7 8.2*    PT/INR: No results for input(s): LABPROT, INR in the last 72 hours.  CXR: FINDINGS: Interim removal of right IJ sheath. Right PICC line in stable position. Stable severe cardiomegaly. Prior median sternotomy and aortic valve replacement. Stable dense bibasilar atelectasis. Small pleural  effusions cannot be excluded. No pneumothorax. Surgical staples and clips right chest.  IMPRESSION: 1. Interim removal right IJ sheath. Right PICC line stable position. 2. Prior median sternotomy and aortic valve replacement. Severe stable cardiomegaly. No pulmonary venous congestion. 3. Persistent dense bibasilar subsegmental atelectasis and/or infiltrates. Tiny small bilateral effusions. No pneumothorax.   Assessment/Plan: S/P Procedure(s) (LRB): AORTIC VALVE REPLACEMENT (AVR) using a 61mm Edwards Aortic Magna Ease Valve  (N/A) TRANSESOPHAGEAL ECHOCARDIOGRAM (TEE) (N/A) MAZE (N/A) REPLACEMENT ASCENDING AORTA with a 16mm Hemashield Platinum Graft (N/A)  CV- BPs improved, continue current meds.  Probable RLL aspiration pna- WBC trending down. O2 being weaned. Continue IV abx, pulm toilet.  Dysphagia- Continue D3 diet, spoke with speech path and they plan to repeat swallow study soon.  Continue PT/reconditioning.  Disp- to SNF at d/c.   LOS: 18 days    COLLINS,GINA H 04/14/2015  Back in afib -slow HR- hold amiodarone Not candidate for coumadin- cont ASA Keep in hospital to optimize pulmonary status- room air sats drop to 85%- CXR with basilar alelectasis- restart lasix patient examined and medical record reviewed,agree with above note. [name] 04/14/2015

## 2015-04-14 NOTE — Progress Notes (Addendum)
CARDIAC REHAB PHASE I   PRE:  Rate/Rhythm: 42 SR c/ PVCs  BP:  Sitting: 122/71        SaO2: 86 RA, 92 on 2L   MODE:  Ambulation: 250 ft   POST:  Rate/Rhythm: 74 SR  BP:  Sitting: 179/89         SaO2: 88 2L, recheck other hand 92 2L  Pt asked for 30 minutes prior to ambulation to "finish business on his phone." Upon returning, pt agreeable to walk. Pt still on 2L O2, still with audible secretions.  Pt states he needs a "wheelchair" to walk.  Pt states he would like to walk without oxygen. Pt RN at bedside. Per RN, MD states to wean O2. Pt removed oxygen, sats dropped to 86 on RA at rest. Pt irritable but agreeable to wear O2 during ambulation. 2L O2 applied, sats improved to 92 on 2L. Pt requesting breathing treatment prior to ambulation. Treatment given per respiratory. Pt now refusing to ambulate with walker or handheld assist, states "I don't need that."  Pt ambulated 250 ft on 2L O2, standby assist, IV. Pt denies CP, dizziness, SOB, declined resting stop and states he does not wish to walk any farther.  Pt still requiring O2 at this time at rest and with activity. Pt returned to chair, call bell and phone within reach. Pt requesting to see RN, RN notified.   1400-1500  Lenna Sciara, RN, BSN 04/14/2015 2:52 PM

## 2015-04-15 ENCOUNTER — Telehealth: Payer: Self-pay | Admitting: *Deleted

## 2015-04-15 LAB — BASIC METABOLIC PANEL
Anion gap: 8 (ref 5–15)
BUN: 19 mg/dL (ref 6–23)
CO2: 28 mmol/L (ref 19–32)
Calcium: 8.7 mg/dL (ref 8.4–10.5)
Chloride: 100 mmol/L (ref 96–112)
Creatinine, Ser: 1.12 mg/dL (ref 0.50–1.35)
GFR calc Af Amer: 84 mL/min — ABNORMAL LOW (ref >90)
GFR calc non Af Amer: 73 mL/min — ABNORMAL LOW (ref >90)
Glucose, Bld: 109 mg/dL — ABNORMAL HIGH (ref 70–99)
Potassium: 4.2 mmol/L (ref 3.5–5.1)
Sodium: 136 mmol/L (ref 135–145)

## 2015-04-15 LAB — GLUCOSE, CAPILLARY
Glucose-Capillary: 101 mg/dL — ABNORMAL HIGH (ref 70–99)
Glucose-Capillary: 104 mg/dL — ABNORMAL HIGH (ref 70–99)
Glucose-Capillary: 107 mg/dL — ABNORMAL HIGH (ref 70–99)
Glucose-Capillary: 124 mg/dL — ABNORMAL HIGH (ref 70–99)
Glucose-Capillary: 136 mg/dL — ABNORMAL HIGH (ref 70–99)

## 2015-04-15 MED ORDER — FUROSEMIDE 40 MG PO TABS
40.0000 mg | ORAL_TABLET | Freq: Every day | ORAL | Status: DC
  Administered 2015-04-15 – 2015-04-16 (×2): 40 mg via ORAL
  Filled 2015-04-15 (×3): qty 1

## 2015-04-15 MED ORDER — AMLODIPINE BESYLATE 5 MG PO TABS
5.0000 mg | ORAL_TABLET | Freq: Every day | ORAL | Status: DC
  Administered 2015-04-15 – 2015-04-16 (×2): 5 mg via ORAL
  Filled 2015-04-15 (×3): qty 1

## 2015-04-15 MED ORDER — CARVEDILOL 3.125 MG PO TABS
3.1250 mg | ORAL_TABLET | Freq: Two times a day (BID) | ORAL | Status: DC
  Administered 2015-04-17 (×2): 3.125 mg via ORAL
  Filled 2015-04-15 (×8): qty 1

## 2015-04-15 NOTE — Progress Notes (Signed)
04/15/2015 7:35 AM Nursing note Noted pt. HR SB 51-55. Pt. Asymptomatic. AM Coreg held until evaluated by MD. Will await further orders. Will continue to monitor patient.  Tashema Tiller, [place] Lime

## 2015-04-15 NOTE — Progress Notes (Signed)
LCSW received call from [facility] in the Emergency Room that patient's sister on the phone looking for SW for patient. LCSW took call to assist family member. Unit CSW is currently off for the rest of the week and coverage is Randall Hiss: [phone]  Per family member:  Roddie Mc:  [phone] They are requesting patient be sent to [facility] in [place]. Reports they have contacted facility and needs SW intervention for placement needs. Contact person Kennyth Lose has been speaking with is [phone] Jonelle Sidle) Fax: [phone]  This is there first choice as all family is close to [place] area.  Not open to [place] facilities at this time. Information was called to covering [place], but had to leave a message.  Will follow up as needed.  Lane Hacker, MSW Clinical Social Work: Emergency Room [phone]

## 2015-04-15 NOTE — Clinical Social Work Note (Signed)
CSW received phone call from patient's sister who lives near [place] and she is requesting that patient be placed at [facility] which is an inpatient rehab facility.  CSW contacted facility who requested clinical information be sent to facility, [facility] faxed requested information at 2:00pm.  CSW spoke to [place] at [phone] who is admissions coordinator, she said a decision will be made within 24 hours.  CSW contacted patient's sister Kennyth Lose [phone] and informed her that information was faxed.  CSW reminded her that if patient is not accepted they are going to have to look at [facility] in [place] or [facility] in [place].  CSW is awaiting decision from [facility].  Jones Broom. [place], MSW, [place] 04/15/2015 4:24 PM

## 2015-04-15 NOTE — Progress Notes (Signed)
04/15/2015 4:09 PM Nursing note Contacted Suzzanne Cloud PAC regarding pt. Low HR. BP stable. Pt. Asymptomatic. Verbal order received to hold PM coreg. MD to address in AM. Pt. Updated on plan of care. Will continue to monitor patient.  Cherylanne Ardelean, [place] Lime

## 2015-04-15 NOTE — Progress Notes (Signed)
Physical Therapy Treatment Patient Details Name: [name] MRN: [id number] DOB: 1961/04/19 Today's Date: 04/15/2015    History of Present Illness [name] is a 54 yo man with PMH of hypertension, atrial flutter, mild AS, moderate AI and nonischemic cardiomyopathy with MUGA EF ~ 35% after LHC 15-20% after being hospitalized several times for CHF having 12/11-12/12/15 admission for RHC/LHC. He presents today with 2 weeks of progressive shortness of breath. No infectious symptoms - no fever/chills/nausea/vomiting/diarrhea. He has 20 feet dyspnea on exertion. He tells me he sleeps on 8 pillows/recliner. He wakes up SOB from sleep. He received IV lasix in the ER. ECG noted to be in atrial flutter with BNP 2700 and Trop 0.1. No current chest pain.     PT Comments    Pt progressing towards physical therapy goals. 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 Pt very focused on lower leg/ankle/feet edema. Pt was educated on ways to reduce edema, and RN states she can get TED stockings for pt. Pt was able to ambulate without an AD fairly well, however on RA sats continue to drop as low as 80%. Will continue to follow and progress as able per POC.   Follow Up Recommendations  SNF;Other (comment) (pt reports that is where he will be going)     Equipment Recommendations  None recommended by PT    Recommendations for Other Services       Precautions / Restrictions Precautions Precautions: Fall Restrictions Weight Bearing Restrictions: No    Mobility  Bed Mobility               General bed mobility comments: Pt sitting up in recliner upon PT arrival.   Transfers Overall transfer level: Needs assistance Equipment used: None Transfers: Sit to/from Stand Sit to Stand: Modified independent (Device/Increase time)         General transfer comment: no unsteadiness noted upon  standing  Ambulation/Gait Ambulation/Gait assistance: Supervision Ambulation Distance (Feet): 200 Feet Assistive device: None Gait Pattern/deviations: WFL(Within Functional Limits)   Gait velocity interpretation: at or above normal speed for age/gender General Gait Details: no LOB, but cues for pacing self and for safety. Pt argumentative about pursed-lip berathing and when cued to slow down.    Stairs            Wheelchair Mobility    Modified Rankin (Stroke Patients Only)       Balance Overall balance assessment: Needs assistance Sitting-balance support: Feet supported;No upper extremity supported Sitting balance-Leahy Scale: Good     Standing balance support: No upper extremity supported Standing balance-Leahy Scale: Fair                      Cognition Arousal/Alertness: Awake/alert Behavior During Therapy: Flat affect Overall Cognitive Status: Within Functional Limits for tasks assessed                      Exercises      General Comments        Pertinent Vitals/Pain Pain Assessment: No/denies pain    Home Living                      Prior Function            PT Goals (current goals can now be found in the care plan section) Acute Rehab PT Goals Patient Stated Goal: Decrease the  swelling in his legs PT Goal Formulation: With patient Time For Goal Achievement: 04/22/15 Potential to Achieve Goals: Good Progress towards PT goals: Progressing toward goals    Frequency  Min 3X/week    PT Plan Current plan remains appropriate    Co-evaluation             End of Session Equipment Utilized During Treatment: Oxygen (Pt refused donning of gait belt) Activity Tolerance: Patient tolerated treatment well Patient left: in chair;with call bell/phone within reach;with nursing/sitter in room     Time: 1003-1035 PT Time Calculation (min) (ACUTE ONLY): 32 min  Charges:  $Gait Training: 8-22 mins $Therapeutic Activity:  8-22 mins                    G Codes:      Rolinda Roan 05/03/2015, 2:04 PM  Rolinda Roan, PT, DPT Acute Rehabilitation Services Pager: [phone]

## 2015-04-15 NOTE — Progress Notes (Signed)
CARDIAC REHAB PHASE I   PRE:  Rate/Rhythm: 60 afib    BP: sitting 136/77    SaO2: 98 2L  MODE:  Ambulation: 500 ft   POST:  Rate/Rhythm: 76 afib ?, has p waves at times    BP: sitting 183/88     SaO2: 89 2L  Pt moving independently, did not need assistive device or assist. Rest x2 for DOE. Significant SOB after walk but had to hurry to urinate. SaO2 89 2L after using urinal. Encouraged pt to walk again later and notified RN that he could walk independently if she set up O2 with IV pole. Encouraged IS and flutter and coughing. Pt very congested. I struggled to understand pt at times, mumbling and quiet voice. Seemingly delayed processing at times as well. Left in recliner with feet elevated on chair. [place], [place], ACSM 04/15/2015 2:52 PM

## 2015-04-15 NOTE — Progress Notes (Signed)
       [address]       [place] [address]             [phone]          10 Days Post-Op Procedure(s) (LRB): AORTIC VALVE REPLACEMENT (AVR) using a 60mm Edwards Aortic Magna Ease Valve  (N/A) TRANSESOPHAGEAL ECHOCARDIOGRAM (TEE) (N/A) MAZE (N/A) REPLACEMENT ASCENDING AORTA with a 49mm Hemashield Platinum Graft (N/A)  Subjective: Very frustrated with vocal cord issues today. Feels ok.  +cough. Desats with activity.   Objective: Vital signs in last 24 hours: Patient Vitals for the past 24 hrs:  BP Temp Temp src Pulse Resp SpO2 Weight  04/15/15 0455 (!) 141/84 mmHg 98.5 F (36.9 C) Oral 83 18 100 % 231 lb 7.7 oz (105 kg)  04/14/15 2232 114/82 mmHg - - (!) 56 - - -  04/14/15 2109 - - - (!) 50 18 100 % -  04/14/15 1944 115/86 mmHg 97.4 F (36.3 C) Oral 62 - 97 % -  04/14/15 1427 - - - - - 97 % -  04/14/15 1358 122/71 mmHg - - 63 - - -  04/14/15 1238 (!) 123/110 mmHg - - (!) 59 - - -  04/14/15 1218 (!) 154/81 mmHg - - 65 - - -  04/14/15 1203 (!) 159/86 mmHg - - 62 - - -  04/14/15 1128 (!) 149/93 mmHg - - (!) 56 - - -  04/14/15 1114 (!) 180/92 mmHg - - 70 - - -   Current Weight  04/15/15 231 lb 7.7 oz (105 kg)   PRE-OPERATIVE WEIGHT: 101kg  Intake/Output from previous day: 04/27 0701 - 04/28 0700 In: 260 [P.O.:250; I.V.:10] Out: 300 [Urine:300]  CBGs [phone]   PHYSICAL EXAM:  Heart: Brady 50-60s Lungs: Coarse rhonchi bilaterally Wound: Clean and dry Extremities: +LE edema    Lab Results: CBC: Recent Labs  04/13/15 0415  WBC 13.7*  HGB 8.6*  HCT 26.7*  PLT 430*   BMET:  Recent Labs  04/14/15 1210 04/15/15 0115  NA 138 136  K 3.8 4.2  CL 102 100  CO2 28 28  GLUCOSE 169* 109*  BUN 20 19  CREATININE 1.15 1.12  CALCIUM 8.6 8.7    PT/INR: No results for input(s): LABPROT, INR in the last 72 hours.    Assessment/Plan: S/P Procedure(s) (LRB): AORTIC VALVE REPLACEMENT (AVR) using a 54mm Edwards Aortic Magna Ease Valve   (N/A) TRANSESOPHAGEAL ECHOCARDIOGRAM (TEE) (N/A) MAZE (N/A) REPLACEMENT ASCENDING AORTA with a 89mm Hemashield Platinum Graft (N/A)  CV- Bradycardic in 50s-60s, off Amiodarone.  Will hold Coreg this am and decrease Coreg dose to 3.125 mg bid. Continue hydralazine, will add Norvasc for HTN.  Probable RLL aspiration pna- WBC trending down. O2 being weaned. Continue IV abx, pulm toilet.  Dysphagia- Continue D3 diet, for repeat swallow study soon.  Continue PT/reconditioning.  Disp- to SNF at d/c   LOS: 19 days    Canisha Issac H 04/15/2015

## 2015-04-15 NOTE — Discharge Summary (Signed)
Physician Discharge Summary  Patient ID: CYRUSS ARATA MRN: [id number] DOB/AGE: June 02, 1961 54 y.o.  Admit date: 03/27/2015 Discharge date: 04/23/2015  Admission Diagnoses:  Patient Active Problem List   Diagnosis Date Noted  . Aortic stenosis 03/29/2015  . Bicuspid aortic valve 03/29/2015  . Elevated troponin 03/28/2015  . Acute on chronic systolic heart failure 64/40/3474  . Weakness generalized 11/27/2014  . Aortic insufficiency 11/19/2014  . 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 Essential hypertension 11/19/2014  . 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 Chronic systolic CHF (congestive heart failure) 11/19/2014  . SOB (shortness of breath) 11/19/2014  . Atrial flutter 11/19/2014   Discharge Diagnoses:   Patient Active Problem List   Diagnosis Date Noted  . S/P AVR (aortic valve replacement) and aortoplasty 04/05/2015  . Aortic stenosis 03/29/2015  . Bicuspid aortic valve 03/29/2015  . Elevated troponin 03/28/2015  . Acute on chronic systolic heart failure 25/95/6387  . Weakness generalized 11/27/2014  . Aortic insufficiency 11/19/2014  . 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 Essential hypertension 11/19/2014  . 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 Chronic systolic CHF (congestive heart failure) 11/19/2014  . SOB (shortness of breath) 11/19/2014  . Atrial flutter 11/19/2014   Discharged Condition: good  History of Present Illness:  Mr. Ambrosino is a 30 African American male with known Aortic Insufficiency and Atrial Flutter (on Plavix).  This was diagnosed last fall in [place].  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  Evaluation revealed patient to be in CHF.  Echocardiogram was obtained and showed moderate Aortic Stenosis and severe LV dysfunction with a reduced EF of less than 20 %.  Cardiac catheterization was also performed and showed normal coronary arteries.  He was instructed  to follow up when he returned home.  He again presented to the ED, however this was here at [facility].  He was again complaining of worsening shortness of breath.  He was again diagnosed with an acute CHF exacerbation.    Hospital Course:   He was admitted by Cardiology for medical management.  He responded well to diuresis.  Repeat Echocardiogram was obtained as well as TEE which again confirmed the presence of Aortic Stenosis.  He complained of dysphagia prior to his Aortic Valve surgery, however it was not evaluated by Speech and Swallow at that time.  It was felt Aortic Valve Intervention would be needed and TCTS was consulted.  He was evaluated by Dr. Prescott Gum on 03/30/2015 at which time he felt the patient should have a CT scan of his chest to assess for Aortic Aneurysm.  He also felt the patient should remain in the hospital prior to proceeding with surgery.  CTA of the chest was obtained and showed a 5cm fusiform ascending aortic aneurysm. Due to this finding it was felt his ascending aorta should also be replaced.  The risks and benefits of the procedure were explained to the patient and he was agreeable to proceed.  He was taken to the operating room on 04/05/2015.  He underwent Aortic Valve Replacement with a 27 mm Edwards Magna Ease Pericardial Valve, Replacement of the Ascending Aorta with a 30 mm Hemashield straight graft, and Right Atrial Maze procedure.  He tolerated the procedure and was taken to the SICU in stable condition.  During his stay in the SICU the patient was weaned and extubated on POD #1.  He was weaned off Milrinone, Dopamine, and Levophed as tolerated.  He developed a wet cough and dysphagia on POD #2.  He was treated empirically with [name] and Speech and Swallow Evaluation was obtained.  He was found to have severe swallowing dysfunction and was made NPO.  His chest tubes and arterial lines were removed without difficulty.  The patient continued to have hoarsness and  swallowing difficulties.  He was routinely followed by SSLP who recommended NPO and patient was started on TPN.  FEES study was performed and showed decreased pharyngolaryngeal mobility particularly on the left side.  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  He was maintaining NSR and felt medically stable for transfer to the step down unit on POD #6.    The patient is progressing slowly.  He continues to work with [place].  He has been taken off TPN and has been passed his most recent swallow study.  He has been placed on a cardiac diet and he is tolerating this without diffcutly.  Patient takes a lot of encouragement to follow instructions.  He converted into Atrial Fibrillation however his HR was low.  Therefore he was not placed on Amiodarone.  His beta blocker does was decreased and if cant be administered will be discontinued due to Bradycardia.  He is working with PT/OT and it is recommended he go to a SNF facility for further rehab and SSLP treatment.  He has developed significant bilateral LE edema which is being treated with diuresis.  He is responding well to this.  However due to the edema in his feet he is having difficulty with ambulation due to discomfort.  He continues to have dyspnea and requires breaks during ambulation.  He continues to make good progress.  He has been weaned off his oxygen.  He is tolerating a regular diet without difficulty.  He is ambulating with minimal assistance. He is medically stable for discharge 04/23/2015 to SNF facility.  Consults: SSLP, ENT  Significant Diagnostic Studies:   ECHO:Left ventricle: The cavity size was moderately dilated. Wall thickness was normal. Systolic function was severely reduced. The estimated ejection fraction was in the range of 25% to 30%. Diffuse hypokinesis. - Aortic valve: Mildly dilated, mildly to moderately calcified annulus. Probably trileaflet; mildly thickened  leaflets. Cusp separation was reduced. Right coronary cusp mobility was very restricted. Transvalvular velocity was increased, 319 cm/s. There was moderate stenosis. Peak gradient 41 mmHg, mean gradient 24 mmHg. AVA 1.74 cm2 (VTI) There was severe regurgitation, with multiple jets most likely originating from the central coaptation point and directed towards the mitral anterior leaflet. - Aorta: The aorta was poorly visualized and mildly calcified. Mild fibrocalcific change of the root was noted. There was dilation of the LVOT, 3.4 cm, and dilation of the sinus(es) of Valsalva, 3.46 cm. There was an aortic root/ascending aneurysm.  Treatments: surgery:   1. Aortic valve replacement with a 27 mm pericardial tissue valve-  [facility] Ease, serial [id number]. 2. Replacement of ascending thoracic aortic fusiform 5 cm aneurysm  with a 30 mm Hemashield straight graft from the sino-tubular  junction to the innominate artery. 3. Right atrial maze procedure for ablation of right atrial flutter. 4. Exposure of right axillary artery for arterial cannulation if  needed.  Disposition: SNF  Discharge medications:    Medication List    STOP taking these medications        aspirin 81 MG tablet  Replaced by:  aspirin 325 MG EC tablet  carvedilol 25 MG tablet  Commonly known as:  COREG     clopidogrel 75 MG tablet  Commonly known as:  PLAVIX     hydrALAZINE 25 MG tablet  Commonly known as:  APRESOLINE      TAKE these medications        albuterol 108 (90 BASE) MCG/ACT inhaler  Commonly known as:  PROVENTIL HFA;VENTOLIN HFA  Inhale 2 puffs into the lungs every 4 (four) hours as needed for wheezing or shortness of breath.     aspirin 325 MG EC tablet  Take 1 tablet (325 mg total) by mouth daily.     atorvastatin 40 MG tablet  Commonly known as:  LIPITOR  Take 40 mg by mouth daily.     diphenhydrAMINE 25 mg capsule  Commonly known as:  BENADRYL   Take 1 capsule (25 mg total) by mouth at bedtime as needed for sleep.     furosemide 20 MG tablet  Commonly known as:  LASIX  Take 1 tablet (20 mg total) by mouth daily.     lisinopril 5 MG tablet  Commonly known as:  PRINIVIL,ZESTRIL  Take 1 tablet (5 mg total) by mouth daily.     oxyCODONE 5 MG immediate release tablet  Commonly known as:  Oxy IR/ROXICODONE  Take 1-2 tablets (5-10 mg total) by mouth every 3 (three) hours as needed for severe pain.     potassium chloride SA 20 MEQ tablet  Commonly known as:  KLOR-CON M20  Take 1 tablet (20 mEq total) by mouth daily.     ranitidine 150 MG capsule  Commonly known as:  ZANTAC  Take 150 mg by mouth daily.     vitamin B-12 500 MCG tablet  Commonly known as:  CYANOCOBALAMIN  Take 500 mcg by mouth daily.         The patient has been discharged on:   1.Beta Blocker:  Yes [   ]                              No   [  x ]                              If No, reason: Bradycardia  2.Ace Inhibitor/ARB: Yes [  x ]                                     No  [  ]                                     If No, reason:   3.Statin:   Yes [ x  ]                  No  [   ]                  If No, reason:  4.Ecasa:  Yes  [ x  ]                  No   [   ]                  If No, reason:  Follow-up Information  Follow up with Len Childs, MD On 05/12/2015.   Specialty:  Cardiothoracic Surgery   Why:  Appoinment is at 11:30   Contact information:   [place] [address] [phone]       Follow up with [facility] On 05/12/2015.   Why:  Please get CXR at 11:00   Contact information:   [facility]       Follow up with Murray Hodgkins, NP On 04/30/2015.   Specialties:  Nurse Practitioner, Cardiology, Radiology   Why:  Appointment is at 9:30   Contact information:   [address] [place] [address] [phone]       Follow up with Izora Gala, MD On 05/24/2015.    Specialty:  Otolaryngology   Why:  Apppointment is at 9:00, please arrive at 8:40   Contact information:   [address] [place] [address] [phone]       Signed: Nani Skillern PA-C 04/23/2015, 3:57 PM

## 2015-04-15 NOTE — Progress Notes (Signed)
SLP Cancellation Note  Patient Details Name: [name] MRN: [id number] DOB: 09/30/1961   Cancelled treatment:       Reason Eval/Treat Not Completed: Other (comment). Will complete repeat MBS tomorrow to determine best diet at time of d/c.    Josemiguel Gries, Katherene Ponto 04/15/2015, 10:34 AM

## 2015-04-15 NOTE — Telephone Encounter (Signed)
TCM--Call pt on Monday. Being discharged on Saturday. TOC appt with Ignacia Bayley, NP on 5/13

## 2015-04-15 NOTE — Discharge Instructions (Signed)
Aortic Valve Replacement, Care After °Refer to this sheet in the next few weeks. These instructions provide you with information on caring for yourself after your procedure. Your health care provider may also give you specific instructions. Your treatment has been planned according to current medical practices, but problems sometimes occur. Call your health care provider if you have any problems or questions after your procedure. °HOME CARE INSTRUCTIONS  °· Take medicines only as directed by your health care provider. °· If your health care provider has prescribed elastic stockings, wear them as directed. °· Take frequent naps or rest often throughout the day. °· Avoid lifting over 10 lbs (4.5 kg) or pushing or pulling things with your arms for 6-8 weeks or as directed by your health care provider. °· Avoid driving or airplane travel for 4-6 weeks after surgery or as directed by your health care provider. If you are riding in a car for an extended period, stop every 1-2 hours to stretch your legs. Keep a record of your medicines and medical history with you when traveling. °· Do not drive or operate heavy machinery while taking pain medicine. (narcotics). °· Do not cross your legs. °· Do not use any tobacco products including cigarettes, chewing tobacco, or electronic cigarettes. If you need help quitting, ask your health care provider. °· Do not take baths, swim, or use a hot tub until your health care provider approves. Take showers once your health care provider approves. Pat incisions dry. Do not rub incisions with a washcloth or towel. °· Avoid climbing stairs and using the handrail to pull yourself up for the first 2-3 weeks after surgery. °· Return to work as directed by your health care provider. °· Drink enough fluid to keep your urine clear or pale yellow. °· Do not strain to have a bowel movement. Eat high-fiber foods if you become constipated. You may also take a medicine to help you have a bowel  movement (laxative) as directed by your health care provider. °· Resume sexual activity as directed by your health care provider. Men should not use medicines for erectile dysfunction until their doctor says it is okay. °· If you had a certain type of heart condition in the past, you may need to take antibiotic medicine before having dental work or surgery. Let your dentist and health care providers know if you had one or more of the following: °¨ Previous endocarditis. °¨ An artificial (prosthetic) heart valve. °¨ Congenital heart disease. °SEEK MEDICAL CARE IF: °· You develop a skin rash.   °· You experience sudden changes in your weight. °· You have a fever. °SEEK IMMEDIATE MEDICAL CARE IF:  °· You develop chest pain that is not coming from your incision. °· You have drainage (pus), redness, swelling, or pain at your incision site.   °· You develop shortness of breath or have difficulty breathing.   °· You have increased bleeding from your incision site.   °· You develop light-headedness.   °MAKE SURE YOU:  °· Understand these directions. °· Will watch your condition. °· Will get help right away if you are not doing well or get worse. °Document Released: 06/22/2005 Document Revised: 04/20/2014 Document Reviewed: 09/17/2012 °ExitCare® Patient Information ©2015 [employer]. This information is not intended to replace advice given to you by your health care provider. Make sure you discuss any questions you have with your health care provider. ° °

## 2015-04-16 ENCOUNTER — Inpatient Hospital Stay (HOSPITAL_COMMUNITY): Payer: Medicaid Other

## 2015-04-16 LAB — GLUCOSE, CAPILLARY
GLUCOSE-CAPILLARY: 90 mg/dL (ref 70–99)
GLUCOSE-CAPILLARY: 115 mg/dL — AB (ref 70–99)
GLUCOSE-CAPILLARY: 110 mg/dL — AB (ref 70–99)
Glucose-Capillary: 135 mg/dL — ABNORMAL HIGH (ref 70–99)

## 2015-04-16 NOTE — Progress Notes (Signed)
MBSS complete. Full report located under chart review in imaging section. Paula Busenbark, MA CCC-SLP [phone]  

## 2015-04-16 NOTE — Progress Notes (Signed)
Have attempted to ambulate with pt x2 today, he has declined both times. Sts he is busy and doesn't want to walk now but will later. Encouraged him to have RN set up O2 so he can walk. Attempted to discuss ed with pt. Pt asked many tangential questions, not related to what was being discussed. Minimal learning. I attempted to reinforce low sodium and walking. In the end I was only able to leave the information for the pt to read. Left brochure for CRPII as pt would not answer if he would like for the program to call him.  [place], ACSM 2:30 PM 04/16/2015

## 2015-04-16 NOTE — Progress Notes (Addendum)
       [address]       [place] [address]             [phone]          11 Days Post-Op Procedure(s) (LRB): AORTIC VALVE REPLACEMENT (AVR) using a 68mm Edwards Aortic Magna Ease Valve  (N/A) TRANSESOPHAGEAL ECHOCARDIOGRAM (TEE) (N/A) MAZE (N/A) REPLACEMENT ASCENDING AORTA with a 87mm Hemashield Platinum Graft (N/A)  Subjective: No new complaints this am.   Objective: Vital signs in last 24 hours: Patient Vitals for the past 24 hrs:  BP Temp Temp src Pulse Resp SpO2 Weight  04/16/15 0534 137/85 mmHg 98.4 F (36.9 C) Oral (!) 58 18 100 % -  04/16/15 0500 - - - - - - 233 lb 11 oz (106 kg)  04/15/15 2107 - - - - - 100 % -  04/15/15 2001 132/71 mmHg 98.7 F (37.1 C) Oral (!) 56 18 99 % -  04/15/15 1814 - - - - - 93 % -  04/15/15 1258 126/70 mmHg 98.2 F (36.8 C) Oral (!) 56 18 93 % -  04/15/15 1046 135/75 mmHg - - (!) 59 - - -  04/15/15 0930 - - - - - 98 % -   Current Weight  04/16/15 233 lb 11 oz (106 kg)  PRE-OPERATIVE WEIGHT: 101kg   Intake/Output from previous day: 04/28 0701 - 04/29 0700 In: 248 [P.O.:248] Out: 500 [Urine:500]    PHYSICAL EXAM:  Heart: Loletha Grayer 50-60s Lungs: Diminished BS in bases Wound: Clean and dry Extremities: +LE edema    Lab Results: CBC:No results for input(s): WBC, HGB, HCT, PLT in the last 72 hours. BMET:  Recent Labs  04/14/15 1210 04/15/15 0115  NA 138 136  K 3.8 4.2  CL 102 100  CO2 28 28  GLUCOSE 169* 109*  BUN 20 19  CREATININE 1.15 1.12  CALCIUM 8.6 8.7    PT/INR: No results for input(s): LABPROT, INR in the last 72 hours.  CXR: FINDINGS: Upright seated AP portable view at 0714 hours. Stable cardiomegaly and mediastinal contours. Stable right PICC line. Some of the right chest skin staples have been removed. Continued veiling opacity at the left lung base and platelike atelectasis on the right. Median sternotomy and prosthetic cardiac valve Re identified. No pneumothorax or pulmonary  edema.  IMPRESSION: Stable with small to moderate left pleural effusion and bibasilar atelectasis.  Assessment/Plan: S/P Procedure(s) (LRB): AORTIC VALVE REPLACEMENT (AVR) using a 30mm Edwards Aortic Magna Ease Valve  (N/A) TRANSESOPHAGEAL ECHOCARDIOGRAM (TEE) (N/A) MAZE (N/A) REPLACEMENT ASCENDING AORTA with a 93mm Hemashield Platinum Graft (N/A)  CV- Bradycardic in 50s-60s, off Amiodarone. Coreg held yesterday, this am HRs in 60s. May need to d/c Coreg until HR recovers. Continue hydralazine, Norvasc for HTN- BPs improved.  Probable RLL aspiration pna- WBC trending down. O2 being weaned. Continue IV abx, pulm toilet. CXR improved.  Vol overload- continue diuresis.  Dysphagia- Continue D3 diet, for repeat swallow study soon.  Continue PT/reconditioning.  Disp- to SNF at d/c.  Possibly can d/c staples soon.    LOS: 20 days    COLLINS,GINA H 04/16/2015 Swallow fx w/ sig improvement- reg diet , thin liqs Dc coreg for low HR- no coumadin Remove chest staples Wean O2 Cont IV fortaz  Last dose sun, cont lasix po  SNF sun- Mon patient examined and medical record reviewed,agree with above note. [name] 04/16/2015

## 2015-04-16 NOTE — Clinical Social Work Note (Signed)
CSW received phone call from [facility] inpatient rehab, and the determined that patient is walking 200 ft which too high functioning for them to accept.  CSW will explain to patient's family and discuss what the other options are for patient.  Jones Broom. [place], MSW, [place] 04/16/2015 10:12 AM

## 2015-04-16 NOTE — Progress Notes (Signed)
Patient ambulated 550 ft via rolling walker and 2 liters 02.  Patient stopped twice for rest breaks.  Patient tolerated well. Patient sitting in recliner with call bell within reach. RN will continue to monitor

## 2015-04-16 NOTE — Clinical Social Work Note (Signed)
CSW received phone call from patient's sister Kennyth Lose saying that patient and herself were requesting to see patient's medical records that were sent to [facility].  Patient and his sister wanted to see records because she does not understand why he was denied to go to facility.  CSW reminded her that the facility determined that he does not have enough goals for rehab and he is doing too well for them to accept patient.  Patient's sister was informed that it is a Hippa violation and she can contact medical records with any other questions.  Jones Broom. [place], MSW, [place] 04/16/2015 7:39 PM

## 2015-04-16 NOTE — Progress Notes (Signed)
UR Completed. Leven Hoel, RN, BSN.  [phone] 

## 2015-04-16 NOTE — Progress Notes (Signed)
04/16/2015 4:42 PM Nursing notes Staples removed from right chest per orders. Pt. Tolerated well. Will continue to monitor patient.  Shyquan Stallbaumer, Arville Lime

## 2015-04-16 NOTE — Progress Notes (Signed)
04/16/2015 11:39 AM  Nursing note Confirmed with Speech Therapy ok for pt. To have thin liquids at this time, no restrictions, pills whole with water or patient preference. Pt. Updated on plan of care.  Hollee Fate, Arville Lime

## 2015-04-17 LAB — GLUCOSE, CAPILLARY
GLUCOSE-CAPILLARY: 127 mg/dL — AB (ref 70–99)
GLUCOSE-CAPILLARY: 94 mg/dL (ref 70–99)
Glucose-Capillary: 94 mg/dL (ref 70–99)
Glucose-Capillary: 131 mg/dL — ABNORMAL HIGH (ref 70–99)

## 2015-04-17 LAB — URINALYSIS, ROUTINE W REFLEX MICROSCOPIC
Bilirubin Urine: NEGATIVE
Glucose, UA: NEGATIVE mg/dL
Hgb urine dipstick: NEGATIVE
Ketones, ur: NEGATIVE mg/dL
Leukocytes, UA: NEGATIVE
Nitrite: NEGATIVE
PH: 6.5 (ref 5.0–8.0)
Protein, ur: NEGATIVE mg/dL
SPECIFIC GRAVITY, URINE: 1.005 (ref 1.005–1.030)
Urobilinogen, UA: 1 mg/dL (ref 0.0–1.0)

## 2015-04-17 MED ORDER — METOLAZONE 5 MG PO TABS
5.0000 mg | ORAL_TABLET | Freq: Every day | ORAL | Status: DC
  Administered 2015-04-17 – 2015-04-20 (×4): 5 mg via ORAL
  Filled 2015-04-17 (×4): qty 1

## 2015-04-17 MED ORDER — FUROSEMIDE 10 MG/ML IJ SOLN
40.0000 mg | Freq: Every day | INTRAMUSCULAR | Status: AC
  Administered 2015-04-17 – 2015-04-19 (×3): 40 mg via INTRAVENOUS
  Filled 2015-04-17 (×3): qty 4

## 2015-04-17 MED ORDER — AMLODIPINE BESYLATE 10 MG PO TABS
10.0000 mg | ORAL_TABLET | Freq: Every day | ORAL | Status: DC
  Administered 2015-04-17 – 2015-04-18 (×2): 10 mg via ORAL
  Filled 2015-04-17 (×3): qty 1

## 2015-04-17 NOTE — Progress Notes (Addendum)
[address]       [place] [address]             [phone]      12 Days Post-Op Procedure(s) (LRB): AORTIC VALVE REPLACEMENT (AVR) using a 33mm Edwards Aortic Magna Ease Valve  (N/A) TRANSESOPHAGEAL ECHOCARDIOGRAM (TEE) (N/A) MAZE (N/A) REPLACEMENT ASCENDING AORTA with a 4mm Hemashield Platinum Graft (N/A)   Subjective:  [name] complains of shortness of breath and lower extremity edema.  He states he swollen feet are making it harder for him to ambulate.  He also states he gets winded very easily and has to rest during ambulation sessions.  + BM  Objective: Vital signs in last 24 hours: Temp:  [99.5 F (37.5 C)-99.7 F (37.6 C)] 99.7 F (37.6 C) (04/30 0619) Pulse Rate:  [60-65] 65 (04/30 0619) Cardiac Rhythm:  [-] Heart block (04/29 2042) Resp:  [17-18] 18 (04/30 0619) BP: (131-155)/(78-83) 155/78 mmHg (04/30 0619) SpO2:  [93 %-100 %] 97 % (04/30 0619)  Intake/Output from previous day: 04/29 0701 - 04/30 0700 In: 606 [P.O.:596; I.V.:10] Out: 251 [Urine:250; Stool:1] Intake/Output this shift: Total I/O In: 250 [P.O.:240; I.V.:10] Out: -   General appearance: alert, cooperative and no distress Heart: regular rate and rhythm and brady Lungs: clear to auscultation bilaterally Abdomen: soft, non-tender; bowel sounds normal; no masses,  no organomegaly Extremities: edema 2-3+ pitting Wound: clean and dry  Lab Results: No results for input(s): WBC, HGB, HCT, PLT in the last 72 hours. BMET:  Recent Labs  04/14/15 1210 04/15/15 0115  NA 138 136  K 3.8 4.2  CL 102 100  CO2 28 28  GLUCOSE 169* 109*  BUN 20 19  CREATININE 1.15 1.12  CALCIUM 8.6 8.7    PT/INR: No results for input(s): LABPROT, INR in the last 72 hours. ABG    Component Value Date/Time   PHART 7.403 04/07/2015 1524   HCO3 24.0 04/07/2015 1524   TCO2 25 04/10/2015 1720   ACIDBASEDEF 1.0 04/07/2015 1524   O2SAT 64.9 04/09/2015 0325   CBG (last 3)   Recent  Labs  04/16/15 1604 04/16/15 2055 04/17/15 0616  GLUCAP 115* 110* 94    Assessment/Plan: S/P Procedure(s) (LRB): AORTIC VALVE REPLACEMENT (AVR) using a 57mm Edwards Aortic Magna Ease Valve  (N/A) TRANSESOPHAGEAL ECHOCARDIOGRAM (TEE) (N/A) MAZE (N/A) REPLACEMENT ASCENDING AORTA with a 45mm Hemashield Platinum Graft (N/A)  1. CV- NSR, remains bradycardic, + HTN- hold beta blocker- will increase Norvasc to 10, continue Hydralazine- if continues to be hypertensive can titrate hydralazine to home dose 2. Pulm- weaning oxygen as tolerated, encouraged use of IS 3. Renal- remains hypervolemic, significant LE edema- will change lasix to IV, add Metalazone, TED Hose 4. Dysphagia- improving, tolerating regular diet, continues to be hoarse which will hopefully improve over time 5. GU- episodes of urinary incontinence, low grade fever today, will repeat UA 6. Deconditioning- patient needs SNF at discharge, he is very short of breath with ambulating and requires multiple breaks.  He becomes winded during bedside conversation.  Also with LE edema he is unsteady on his feet with ambulation and is requiring supervision 7. Dispo- significant LE edema will diurese, check UA with new onset incontinence, titrate BP meds, hopefully can resubmit application for SNF as patient is unsafe for discharge home   LOS: 21 days    [name], ERIN 04/17/2015  Still with leg edema I have seen and examined [name] and agree with the above assessment  and plan.  Grace Isaac MD Bee[place] [phone] Office [phone] 04/17/2015 1:59 PM

## 2015-04-17 NOTE — Progress Notes (Signed)
CARDIAC REHAB PHASE I   11:29am  Nurse states that the patient was just given lasix- need to get lasix out of system and then will be able to ambulate.  Will check back in with patient after lunch.    Neesha Langton, [place], [place] 04/17/2015 11:29 AM

## 2015-04-17 NOTE — Progress Notes (Signed)
Was able to get TED hose on Pt. Around 1300. Pt. Left leg is less swollen. 6:40 PM [name] Doree Fudge

## 2015-04-17 NOTE — Progress Notes (Signed)
CARDIAC REHAB PHASE I   PRE:  Rate/Rhythm: 63  BP:  Sitting: 150/59     SaO2: 97% 2l  MODE:  Ambulation: 100 ft   POST:  Rate/Rhythm: 67  BP:  Sitting: 144/83     SaO2: 97% 2l  1:20pm-1:50pm Before ambulation begun patient stated that he was not going to walk very far. Patient was adamant about this. Patient decreased his walking distance by a lot. Patient needed to take 2 rest breaks due to shortness of breath and he states that his legs felt "tight". Patient placed in chair with call bell in reach.    Tannisha Kennington, [place], [place] 04/17/2015 1:45 PM

## 2015-04-17 NOTE — Progress Notes (Signed)
04/17/2015 10:24 PM  Pt ambulated 200 ft via rolling walker and 2 liters oxygen. Patient once for a rest break. Patient tolerated well. Patient sitting in recliner with call bell within reach. Will continue to monitor.  Ermalinda Memos, RN  [facility] Phone [phone]

## 2015-04-18 LAB — GLUCOSE, CAPILLARY
GLUCOSE-CAPILLARY: 97 mg/dL (ref 70–99)
Glucose-Capillary: 123 mg/dL — ABNORMAL HIGH (ref 70–99)
Glucose-Capillary: 141 mg/dL — ABNORMAL HIGH (ref 70–99)
Glucose-Capillary: 95 mg/dL (ref 70–99)

## 2015-04-18 LAB — BASIC METABOLIC PANEL
Anion gap: 8 (ref 5–15)
BUN: 9 mg/dL (ref 6–20)
CHLORIDE: 97 mmol/L — AB (ref 101–111)
CO2: 28 mmol/L (ref 22–32)
CREATININE: 0.94 mg/dL (ref 0.61–1.24)
Calcium: 8.6 mg/dL — ABNORMAL LOW (ref 8.9–10.3)
GFR calc Af Amer: >60 mL/min (ref >60)
GFR calc non Af Amer: >60 mL/min (ref >60)
GLUCOSE: 134 mg/dL — AB (ref 70–99)
POTASSIUM: 4 mmol/L (ref 3.5–5.1)
Sodium: 133 mmol/L — ABNORMAL LOW (ref 135–145)

## 2015-04-18 LAB — URINE CULTURE
CULTURE: NO GROWTH
Colony Count: NO GROWTH

## 2015-04-18 MED ORDER — POTASSIUM CHLORIDE CRYS ER 20 MEQ PO TBCR
20.0000 meq | EXTENDED_RELEASE_TABLET | Freq: Two times a day (BID) | ORAL | Status: DC
  Administered 2015-04-18 – 2015-04-23 (×11): 20 meq via ORAL
  Filled 2015-04-18 (×13): qty 1

## 2015-04-18 NOTE — Progress Notes (Signed)
Patient refused to walk a third time today.  Patient stating "im walking in place in my room"  RN educated patient on the benefits of walking post operatively, patient continued to refuse.  RN will continue to monitor patient .

## 2015-04-18 NOTE — Progress Notes (Addendum)
      [address]       ,[place] [address]             [phone]      13 Days Post-Op Procedure(s) (LRB): AORTIC VALVE REPLACEMENT (AVR) using a 59mm Edwards Aortic Magna Ease Valve  (N/A) TRANSESOPHAGEAL ECHOCARDIOGRAM (TEE) (N/A) MAZE (N/A) REPLACEMENT ASCENDING AORTA with a 25mm Hemashield Platinum Graft (N/A)   Subjective:  [name] continues to feel a little better.  He had some difficulty with ambulation yesterday due to swelling and discomfort in his feet.  He also states that he had some issues eating fruit but thinks it was because the pieces were too large.    Objective: Vital signs in last 24 hours: Temp:  [98.4 F (36.9 C)-98.8 F (37.1 C)] 98.4 F (36.9 C) (05/01 0424) Pulse Rate:  [50-73] 50 (05/01 0630) Cardiac Rhythm:  [-] Heart block (04/30 1940) Resp:  [16-18] 16 (05/01 0424) BP: (119-127)/(62-86) 127/86 mmHg (05/01 0630) SpO2:  [95 %-100 %] 98 % (05/01 0731) Weight:  [232 lb 12.9 oz (105.6 kg)] 232 lb 12.9 oz (105.6 kg) (05/01 0424)  Intake/Output from previous day: 04/30 0701 - 05/01 0700 In: 730 [P.O.:720; I.V.:10] Out: 2225 [Urine:2225]  General appearance: alert, cooperative and no distress Heart: regular rate and rhythm and brady Lungs: clear to auscultation bilaterally Abdomen: soft, non-tender; bowel sounds normal; no masses,  no organomegaly Extremities: edema 3-4+ improving Wound: clean and dry  Lab Results: No results for input(s): WBC, HGB, HCT, PLT in the last 72 hours. BMET:  Recent Labs  04/18/15 0603  NA 133*  K 4.0  CL 97*  CO2 28  GLUCOSE 134*  BUN 9  CREATININE 0.94  CALCIUM 8.6*    PT/INR: No results for input(s): LABPROT, INR in the last 72 hours. ABG    Component Value Date/Time   PHART 7.403 04/07/2015 1524   HCO3 24.0 04/07/2015 1524   TCO2 25 04/10/2015 1720   ACIDBASEDEF 1.0 04/07/2015 1524   O2SAT 64.9 04/09/2015 0325   CBG (last 3)   Recent Labs  04/17/15 1637 04/17/15 2058  04/18/15 0558  GLUCAP 131* 94 123*    Assessment/Plan: S/P Procedure(s) (LRB): AORTIC VALVE REPLACEMENT (AVR) using a 61mm Edwards Aortic Magna Ease Valve  (N/A) TRANSESOPHAGEAL ECHOCARDIOGRAM (TEE) (N/A) MAZE (N/A) REPLACEMENT ASCENDING AORTA with a 90mm Hemashield Platinum Graft (N/A)  1. CV- Sinus Bradycardia this morning- will d/c Coreg, blood pressure better with increased Norvasc, may benefit from addition of ACE if blood pressure allows 2. Pulm- continue to wean oxygen as tolerated, encouraged use of IS 3. Renal- creatinine WNL, good U/O yesterday, some improvement in LE edema-will continue Lasix, Metalazone 4. Dysphagia- stable, tolerating diet, hoarseness appears improved today 5. GU- UA negative, no longer febrile 6. Deconditioning- patient continues to have dyspnea with ambulation require breaks.  Remains inhibited by LE edema with ambulation, needs SNF placement, will ask social work to resubmit application to [employer]. Placement at different facility 7. Dispo- patient slowly progressing, ambulation inhibited by LE edema continue diuresis, Bradycardic will d/c Coreg, continue current care   LOS: 22 days    [name], [name] 04/18/2015  Still with swollen lower extremities  I have seen and examined [name] and agree with the above assessment  and plan.  Grace Isaac MD [place] [phone] Office [phone] 04/18/2015 3:05 PM

## 2015-04-19 ENCOUNTER — Inpatient Hospital Stay (HOSPITAL_COMMUNITY): Payer: Medicaid Other

## 2015-04-19 ENCOUNTER — Encounter (HOSPITAL_COMMUNITY): Payer: Self-pay

## 2015-04-19 DIAGNOSIS — R609 Edema, unspecified: Secondary | ICD-10-CM

## 2015-04-19 LAB — GLUCOSE, CAPILLARY
GLUCOSE-CAPILLARY: 99 mg/dL (ref 70–99)
GLUCOSE-CAPILLARY: 130 mg/dL — AB (ref 70–99)
Glucose-Capillary: 124 mg/dL — ABNORMAL HIGH (ref 70–99)
Glucose-Capillary: 105 mg/dL — ABNORMAL HIGH (ref 70–99)

## 2015-04-19 MED ORDER — GUAIFENESIN ER 600 MG PO TB12
600.0000 mg | ORAL_TABLET | Freq: Two times a day (BID) | ORAL | Status: DC
  Administered 2015-04-19 – 2015-04-20 (×3): 600 mg via ORAL
  Filled 2015-04-19 (×4): qty 1

## 2015-04-19 MED ORDER — GUAIFENESIN 100 MG/5ML PO SYRP
200.0000 mg | ORAL_SOLUTION | Freq: Every day | ORAL | Status: DC
  Administered 2015-04-19: 200 mg via ORAL
  Filled 2015-04-19 (×2): qty 10

## 2015-04-19 MED ORDER — LISINOPRIL 10 MG PO TABS
10.0000 mg | ORAL_TABLET | Freq: Every day | ORAL | Status: DC
  Administered 2015-04-19 – 2015-04-22 (×3): 10 mg via ORAL
  Filled 2015-04-19 (×5): qty 1

## 2015-04-19 MED ORDER — AMLODIPINE BESYLATE 5 MG PO TABS
5.0000 mg | ORAL_TABLET | Freq: Every day | ORAL | Status: DC
  Filled 2015-04-19: qty 1

## 2015-04-19 NOTE — Progress Notes (Signed)
Speech Language Pathology Treatment: Dysphagia;Cognitive-Linquistic  Patient Details Name: [name] MRN: [id number] DOB: Aug 28, 1961 Today's Date: 04/19/2015 Time: 7034-0352 SLP Time Calculation (min) (ACUTE ONLY): 15 min  Assessment / Plan / Recommendation Clinical Impression  Pt demonstrates tolerance of recommended diet, verbal reminders given for second swallow. SLP also provided modeling and demonstration of increased breath support and easy onset with counting task to increase volume and clearer phonation. Also demonstrated compensatory strategies for clearer speech with pt return demonstrating by rehearsing a public speech he plans to give about his life after he recovery. Will benefit from ongoing intervention.    HPI Other Pertinent Information:  54 y.o. man underwent aortic valve replacment 4/18.  Pt was intubated for surgery and extubated within 24 hours,on 4/19.  PMH:  obesity, HTN, sliding Hiatal Hernia with reflux.  Clinical swallow evaluation on 4/20 revealed dysphagia with concerns for aspiration.  Orders for FEES 04/09/15.    Pertinent Vitals    SLP Plan  Continue with current plan of care    Recommendations Diet recommendations: Regular;Thin liquid Liquids provided via: Cup Medication Administration: Whole meds with liquid Supervision: Patient able to self feed Compensations: Slow rate;Multiple dry swallows after each bite/sip Postural Changes and/or Swallow Maneuvers: Seated upright 90 degrees              Oral Care Recommendations: Patient independent with oral care Follow up Recommendations: Inpatient Rehab;Skilled Nursing facility Plan: Continue with current plan of care    GO    [facility], MA CCC-SLP [phone]  [name] 04/19/2015, 12:05 PM

## 2015-04-19 NOTE — Progress Notes (Signed)
CARDIAC REHAB PHASE I   PRE:  Rate/Rhythm: 60 afib/SR    BP: sitting 122/91    SaO2: 96 1L  MODE:  Ambulation: 550 ft   POST:  Rate/Rhythm: 76 afib    BP: sitting 134/92     SaO2: 86 RA, 96 1L  Pt moving well today. Still c/o leg swelling but did not seem to limit his mobility. RN checked RA at rest before walking, he was in the 80s. Walked on 1L and SaO2 96, which is an improvement. Tried RA again last 120 ft and desat to 83 or 86 RA. Using just 1L is an improvement, does not seem as SOB as he was last week. Pt steady, independent with walking.  Return to recliner. Encouraged more walking. Still with rattling cough every few seconds. [phone]  Josephina Shih [place] CES, ACSM 04/19/2015 11:27 AM

## 2015-04-19 NOTE — Clinical Social Work Note (Addendum)
CSW met with the patient at bedside and spoke with the sister by phone to determine which facility the patient has chosen. Patient and sister still insist on admission to [facility]. CSW explained that previous CSW had looked into this option for the patient, but the facility stated they would not be admitting the patient as he is doing too well with therapies. Sister of patient became very angry with CSW on phone and demanded CSW read patient's chart to her. CSW explained CSW will not provide any information for the sister about the patient diagnoses etc. Sister states, "I don't know what kind of games you all are playing but I'm going to figure this out. He's going to get into that facility." CSW unable to get the patient and sister to understand that the patient's only options at this time is to DC to one of the [facility] or [facility].    Liz Beach MSW, [place], [place], [phone]

## 2015-04-19 NOTE — Progress Notes (Signed)
*  PRELIMINARY RESULTS* Vascular Ultrasound Lower extremity venous duplex has been completed.  Preliminary findings: negative for DVT.   Landry Mellow, RDMS, RVT 04/19/2015 11:32 AM

## 2015-04-19 NOTE — Progress Notes (Signed)
PT Cancellation Note  Patient Details Name: [name] MRN: [id number] DOB: 06/08/1961   Cancelled Treatment:    Reason Treat Not Completed: Patient unavailable--just received dinner.  Will re-attempt 5/3.   Jacque Garrels 04/19/2015, 4:57 PM Pager [phone]

## 2015-04-19 NOTE — Progress Notes (Addendum)
[address]       [place] [address]             [phone]      14 Days Post-Op Procedure(s) (LRB): AORTIC VALVE REPLACEMENT (AVR) using a 64mm Edwards Aortic Magna Ease Valve  (N/A) TRANSESOPHAGEAL ECHOCARDIOGRAM (TEE) (N/A) MAZE (N/A) REPLACEMENT ASCENDING AORTA with a 6mm Hemashield Platinum Graft (N/A) Subjective: Feels ok , some productive cough, hoarse. LE edema remains significant  Objective: Vital signs in last 24 hours: Temp:  [98.8 F (37.1 C)-98.9 F (37.2 C)] 98.9 F (37.2 C) (05/02 0506) Pulse Rate:  [67-85] 71 (05/02 0506) Cardiac Rhythm:  [-] Junctional rhythm (05/01 1949) Resp:  [18-20] 20 (05/02 0506) BP: (116-140)/(65-93) 116/93 mmHg (05/02 0506) SpO2:  [92 %-99 %] 92 % (05/02 0806) Weight:  [226 lb 4.8 oz (102.649 kg)] 226 lb 4.8 oz (102.649 kg) (05/02 0506)  Hemodynamic parameters for last 24 hours:    Intake/Output from previous day: 05/01 0701 - 05/02 0700 In: 720 [P.O.:720] Out: 3125 [Urine:3125] Intake/Output this shift: Total I/O In: 10 [I.V.:10] Out: -   General appearance: alert, cooperative and no distress Heart: slightly irregular Lungs: mild coarseness Abdomen: benign Extremities: + LE pitting edema Wound: incia healing well  Lab Results: No results for input(s): WBC, HGB, HCT, PLT in the last 72 hours. BMET:  Recent Labs  04/18/15 0603  NA 133*  K 4.0  CL 97*  CO2 28  GLUCOSE 134*  BUN 9  CREATININE 0.94  CALCIUM 8.6*    PT/INR: No results for input(s): LABPROT, INR in the last 72 hours. ABG    Component Value Date/Time   PHART 7.403 04/07/2015 1524   HCO3 24.0 04/07/2015 1524   TCO2 25 04/10/2015 1720   ACIDBASEDEF 1.0 04/07/2015 1524   O2SAT 64.9 04/09/2015 0325   CBG (last 3)   Recent Labs  04/18/15 1636 04/18/15 2138 04/19/15 0655  GLUCAP 141* 95 99    Meds Scheduled Meds: . antiseptic oral rinse  7 mL Mouth Rinse q12n4p  . aspirin EC  325 mg Oral Daily   Or  . aspirin   324 mg Per Tube Daily  . atorvastatin  40 mg Oral Daily  . bisacodyl  10 mg Oral Daily   Or  . bisacodyl  10 mg Rectal Daily  . budesonide (PULMICORT) nebulizer solution  0.5 mg Nebulization BID  . chlorhexidine  15 mL Mouth Rinse BID  . docusate sodium  200 mg Oral Daily  . enoxaparin (LOVENOX) injection  40 mg Subcutaneous Q24H  . furosemide  40 mg Intravenous Daily  . hydrALAZINE  20 mg Oral 3 times per day  . insulin aspart  0-9 Units Subcutaneous TID WC  . levalbuterol  0.63 mg Nebulization TID  . lisinopril  10 mg Oral Daily  . metolazone  5 mg Oral Daily  . pantoprazole  40 mg Oral Daily  . potassium chloride  20 mEq Oral BID  . sodium chloride  10-40 mL Intracatheter Q12H  . sodium chloride  3 mL Intravenous Q12H   Continuous Infusions: . lactated ringers Stopped (04/11/15 1258)   PRN Meds:.diphenhydrAMINE, levalbuterol, ondansetron (ZOFRAN) IV, oxyCODONE, sodium chloride  Xrays No results found.  Assessment/Plan: S/P Procedure(s) (LRB): AORTIC VALVE REPLACEMENT (AVR) using a 33mm Edwards Aortic Magna Ease Valve  (N/A) TRANSESOPHAGEAL ECHOCARDIOGRAM (TEE) (N/A) MAZE (N/A) REPLACEMENT ASCENDING AORTA with a 11mm Hemashield Platinum Graft (N/A)  1 stable and improving 2 volume overload- 3.8  liters of UOP yesterday- cont to diurese 3 rhythm looks like some afib with CVR,brady at times, junctional rhythm at times- will get 12 lead to get better idea 4 conts aggressive pulm toilet 5 working with speech therapy 6 cont physical rehab- awaits placement disposition  LOS: 23 days    GOLD,WAYNE E 04/19/2015  Cont iv lasix and check daily BMP Beta blockers and amiodarone cause sig bradycardia DC Norvasc with worsening leg edema and start lisnopril Keep in hospital today

## 2015-04-20 ENCOUNTER — Inpatient Hospital Stay (HOSPITAL_COMMUNITY): Payer: Medicaid Other

## 2015-04-20 DIAGNOSIS — I313 Pericardial effusion (noninflammatory): Secondary | ICD-10-CM

## 2015-04-20 LAB — GLUCOSE, CAPILLARY
GLUCOSE-CAPILLARY: 153 mg/dL — AB (ref 70–99)
Glucose-Capillary: 108 mg/dL — ABNORMAL HIGH (ref 70–99)
Glucose-Capillary: 91 mg/dL (ref 70–99)
Glucose-Capillary: 93 mg/dL (ref 70–99)

## 2015-04-20 LAB — BASIC METABOLIC PANEL
Anion gap: 11 (ref 5–15)
BUN: 11 mg/dL (ref 6–20)
CO2: 30 mmol/L (ref 22–32)
Calcium: 9.1 mg/dL (ref 8.9–10.3)
Chloride: 91 mmol/L — ABNORMAL LOW (ref 101–111)
Creatinine, Ser: 1.09 mg/dL (ref 0.61–1.24)
GFR calc Af Amer: >60 mL/min (ref >60)
GFR calc non Af Amer: >60 mL/min (ref >60)
Glucose, Bld: 104 mg/dL — ABNORMAL HIGH (ref 70–99)
Potassium: 5 mmol/L (ref 3.5–5.1)
Sodium: 132 mmol/L — ABNORMAL LOW (ref 135–145)

## 2015-04-20 MED ORDER — DM-GUAIFENESIN ER 30-600 MG PO TB12
1.0000 | ORAL_TABLET | Freq: Two times a day (BID) | ORAL | Status: DC
  Administered 2015-04-20 – 2015-04-23 (×6): 1 via ORAL
  Filled 2015-04-20 (×9): qty 1

## 2015-04-20 MED ORDER — FUROSEMIDE 40 MG PO TABS
40.0000 mg | ORAL_TABLET | Freq: Every day | ORAL | Status: DC
  Administered 2015-04-21 – 2015-04-22 (×2): 40 mg via ORAL
  Filled 2015-04-20 (×2): qty 1

## 2015-04-20 MED ORDER — METOLAZONE 5 MG PO TABS
5.0000 mg | ORAL_TABLET | Freq: Every day | ORAL | Status: DC
  Administered 2015-04-21: 5 mg via ORAL
  Filled 2015-04-20 (×2): qty 1

## 2015-04-20 MED ORDER — HYDROCOD POLST-CPM POLST ER 10-8 MG/5ML PO SUER
5.0000 mL | Freq: Two times a day (BID) | ORAL | Status: DC
  Administered 2015-04-20 – 2015-04-23 (×7): 5 mL via ORAL
  Filled 2015-04-20 (×8): qty 5

## 2015-04-20 MED ORDER — FUROSEMIDE 10 MG/ML IJ SOLN
40.0000 mg | Freq: Once | INTRAMUSCULAR | Status: AC
  Administered 2015-04-20: 40 mg via INTRAVENOUS
  Filled 2015-04-20: qty 4

## 2015-04-20 NOTE — Progress Notes (Addendum)
      [name]       [name],[place] [address]             [phone]      15 Days Post-Op Procedure(s) (LRB): AORTIC VALVE REPLACEMENT (AVR) using a 79mm Edwards Aortic Magna Ease Valve  (N/A) TRANSESOPHAGEAL ECHOCARDIOGRAM (TEE) (N/A) MAZE (N/A) REPLACEMENT ASCENDING AORTA with a 12mm Hemashield Platinum Graft (N/A)   Subjective:  [name] continues to complain of LE edema and shortness of breath.  Objective: Vital signs in last 24 hours: Temp:  [98.3 F (36.8 C)-99.4 F (37.4 C)] 99 F (37.2 C) (05/03 0327) Pulse Rate:  [65-77] 69 (05/03 0327) Cardiac Rhythm:  [-] Junctional rhythm;Atrial fibrillation;Atrial flutter (05/02 2100) Resp:  [18] 18 (05/03 0327) BP: (90-122)/(41-91) 118/61 mmHg (05/03 0327) SpO2:  [92 %-98 %] 97 % (05/03 0327) Weight:  [222 lb 10.6 oz (101 kg)] 222 lb 10.6 oz (101 kg) (05/03 0327)  Intake/Output from previous day: 05/02 0701 - 05/03 0700 In: 640 [P.O.:600; I.V.:40] Out: 2850 [Urine:2850]  General appearance: alert, cooperative and no distress Heart: irregularly irregular rhythm Lungs: clear to auscultation bilaterally Abdomen: soft, non-tender; bowel sounds normal; no masses,  no organomegaly Extremities: edema 2+ Wound: clean and dry  Lab Results: No results for input(s): WBC, HGB, HCT, PLT in the last 72 hours. BMET:  Recent Labs  04/18/15 0603 04/20/15 0323  NA 133* 132*  K 4.0 5.0  CL 97* 91*  CO2 28 30  GLUCOSE 134* 104*  BUN 9 11  CREATININE 0.94 1.09  CALCIUM 8.6* 9.1    PT/INR: No results for input(s): LABPROT, INR in the last 72 hours. ABG    Component Value Date/Time   PHART 7.403 04/07/2015 1524   HCO3 24.0 04/07/2015 1524   TCO2 25 04/10/2015 1720   ACIDBASEDEF 1.0 04/07/2015 1524   O2SAT 64.9 04/09/2015 0325   CBG (last 3)   Recent Labs  04/19/15 1708 04/19/15 2105 04/20/15 0555  GLUCAP 130* 105* 108*    Assessment/Plan: S/P Procedure(s) (LRB): AORTIC VALVE REPLACEMENT  (AVR) using a 53mm Edwards Aortic Magna Ease Valve  (N/A) TRANSESOPHAGEAL ECHOCARDIOGRAM (TEE) (N/A) MAZE (N/A) REPLACEMENT ASCENDING AORTA with a 51mm Hemashield Platinum Graft (N/A)  1. CV- Episodes of Atrial Fibrillation, Junctional Bradycardia- unable to tolerate Beta Blocker, may benefit from anticoagulation with A. Fib 2. Pulm- wean off oxygen, remains on 1L via [place], good use of IS/nebs 3. Renal- creatinine has been stable, diuresing well, continues to have LE edema, continue Lasix 4. ENT- vocal chord paralysis, hoarseness is a little improved, dysphagia has resolved continue heart healthy diet 5. Deconditioning- patient needs rehab at discharge, they have applied to [facility], which stated patient was doing to well to go there.  Will talk to CSW today to work on further discharge planning.  Family wants patient to go there. 6. Dispo- patient stable, will talk to Dr. Prescott Gum in regards to anticoagulation, continue current care   LOS: 24 days    [name]  Patient with L vocal cord dysfunction still showing signs of aspiration- needs SLT re-eval and will check CXR in am  Not safe for DC due to respiratory status Cont mucinex and gentle diuresis  Re consult SLT  patient examined and medical record reviewed,agree with above note. [name] Campbell

## 2015-04-20 NOTE — Telephone Encounter (Signed)
Patient remains in hospital currently (5/3). Was not discharged this past Saturday, as expected.

## 2015-04-20 NOTE — Clinical Social Work Note (Signed)
CSW spoke with [facility] and charge regarding patient's DC. CSW has shared with patient and patient's sister that the patient has bed offers from [facility] and [facility]. Sister was not happy with these options and insisted on the patient going to the inpatien rehab at [facility] in [place]. Please note this is NOT a disposition option for the patient. If the patient and sister do not want to choose between [facility] and [facility] for short term rehab, then the patient's other option is to return home at discharge.   Liz Beach MSW, [place], [place], [phone]

## 2015-04-20 NOTE — Progress Notes (Signed)
Pt ambulated  in hallway appro9x 215ft. With 1lnc o2.pt stop a couple of times to catch breath. Monitoring will continue.

## 2015-04-20 NOTE — Progress Notes (Signed)
CARDIAC REHAB PHASE I   PRE:  Rate/Rhythm: 73 afib    BP: sitting 106/72    SaO2: 95 1L, 92-93 RA  MODE:  Ambulation: 150 ft   POST:  Rate/Rhythm: 85    BP: sitting 104/84     SaO2: 87 RA, 93 RA with rest  Pt sts he didn't sleep well and is tired today. Wanted to go without O2. Practiced flutter valve before walking. SaO2 would not register while walking but 30 sec after resting it was 87 RA. Pt SOB. Pt did not want to go far, declined increasing distance. SaO2 up to 93 RA with rest. Attempted to encourage pt to walk on O2 so he could go farther. Pt sts he knows his body and what its limits are. Pt continues to be fairly unteachable. To recliner with feet elevated.  Left on RA. [phone]  Josephina Shih [place] CES, ACSM 04/20/2015 2:52 PM

## 2015-04-20 NOTE — Progress Notes (Signed)
  Echocardiogram 2D Echocardiogram has been performed.  [name] 04/20/2015, 2:25 PM

## 2015-04-20 NOTE — Progress Notes (Signed)
Physical Therapy Treatment Patient Details Name: [name] MRN: [id number] DOB: 06/15/1961 Today's Date: 04/20/2015    History of Present Illness [name] is a 54 yo man with PMH of hypertension, atrial flutter, mild AS, moderate AI and nonischemic cardiomyopathy with MUGA EF ~ 35% after LHC 15-20% after being hospitalized several times for CHF having 12/11-12/12/15 admission for RHC/LHC. Adm for shortness of breath.  He received IV lasix in the ER, in atrial flutter with BNP 2700 and Trop 0.1. 4/21 AVR with ascending aorta replacemnt.    PT Comments    Pt sitting EOB with feet on floor on arrival. On room air with SaO2 91%. Very cooperative and interested in reducing his LE edema and improving his activity tolerance and balance. Re-educated on techniques to assist with decreasing LE edema. Pt ambulating slowly on room air--could not get pulse oximeter to provide a reading, therefore resumed 1 L O2 for remainder of walk. On return to room, SaO2 97% on 1L. Returned to room air for balance acitivies and remained 96% throughout.    Follow Up Recommendations  SNF;Other (comment) (If pt can arrange 24/7 supervision, could go home) Continues to need supervision for sternal precautions and ?memory impairments (poor carryover of education provided; ? Memory vs non-compliance)     Equipment Recommendations  None recommended by PT    Recommendations for Other Services       Precautions / Restrictions Precautions Precautions: Fall;Sternal Restrictions Weight Bearing Restrictions: No    Mobility  Bed Mobility               General bed mobility comments: Pt sitting up EOB upon PT arrival.   Transfers Overall transfer level: Modified independent Equipment used: None Transfers: Sit to/from Stand Sit to Stand: Modified independent (Device/Increase time)         General transfer comment: no unsteadiness noted upon standing; repeated x  3  Ambulation/Gait Ambulation/Gait assistance: Supervision;Min guard Ambulation Distance (Feet): 150 Feet Assistive device: None Gait Pattern/deviations: Step-through pattern;Wide base of support;Decreased stride length Gait velocity: slow   General Gait Details: very slow pace with pt stopping to rest in standing x 1; pt attempting to keep his SaO2 up on RA   Stairs            Wheelchair Mobility    Modified Rankin (Stroke Patients Only)       Balance Overall balance assessment: Needs assistance   Sitting balance-Leahy Scale: Good     Standing balance support: No upper extremity supported Standing balance-Leahy Scale: Good Standing balance comment: self-selects feet slightly wider than shoulder width         Rhomberg - Eyes Opened: 30 Rhomberg - Eyes Closed: 20 (very little sway)   High Level Balance Comments: standing alternated toe raises and heel raises; no UE support with no LOB x 3    Cognition Arousal/Alertness: Awake/alert Behavior During Therapy: Flat affect Overall Cognitive Status: No family/caregiver present to determine baseline cognitive functioning Area of Impairment: Memory     Memory: Decreased recall of precautions         General Comments: unable to recall any techniques (other than walking) to decr his leg edema (which he speaks of regularly as his main complaint). Educated again on elevation and use of TED hose (which he allowed me to don)    Exercises General Exercises - Lower Extremity Ankle Circles/Pumps: AROM;Both;10 reps;Seated    General Comments        Pertinent Vitals/Pain Pain Assessment:  Faces Faces Pain Scale: Hurts little more Pain Location: chest Pain Intervention(s): Limited activity within patient's tolerance;Monitored during session;Repositioned    Home Living                      Prior Function            PT Goals (current goals can now be found in the care plan section) Acute Rehab PT  Goals Patient Stated Goal: Decrease the swelling in his legs PT Goal Formulation: With patient Time For Goal Achievement: 04/29/15 Potential to Achieve Goals: Good Progress towards PT goals: Goals met and updated - see care plan    Frequency  Min 2X/week    PT Plan Current plan remains appropriate (? pt will not have 24/7 assist/supervision if d/c home)    Co-evaluation             End of Session Equipment Utilized During Treatment: Oxygen;Gait belt Activity Tolerance: Patient tolerated treatment well Patient left: in chair;with call bell/phone within reach;with nursing/sitter in room     Time: 9102-8902 PT Time Calculation (min) (ACUTE ONLY): 40 min  Charges:  $Gait Training: 23-37 mins $Therapeutic Activity: 8-22 mins                    G Codes:      Tyne Banta 2015-05-02, 12:55 PM Pager [phone]

## 2015-04-21 ENCOUNTER — Inpatient Hospital Stay (HOSPITAL_COMMUNITY): Payer: Medicaid Other

## 2015-04-21 LAB — GLUCOSE, CAPILLARY
GLUCOSE-CAPILLARY: 135 mg/dL — AB (ref 70–99)
Glucose-Capillary: 113 mg/dL — ABNORMAL HIGH (ref 70–99)
Glucose-Capillary: 93 mg/dL (ref 70–99)
Glucose-Capillary: 94 mg/dL (ref 70–99)

## 2015-04-21 LAB — BASIC METABOLIC PANEL
Anion gap: 9 (ref 5–15)
BUN: 12 mg/dL (ref 6–20)
CO2: 32 mmol/L (ref 22–32)
Calcium: 8.9 mg/dL (ref 8.9–10.3)
Chloride: 91 mmol/L — ABNORMAL LOW (ref 101–111)
Creatinine, Ser: 1.19 mg/dL (ref 0.61–1.24)
GFR calc Af Amer: >60 mL/min (ref >60)
GFR calc non Af Amer: >60 mL/min (ref >60)
Glucose, Bld: 114 mg/dL — ABNORMAL HIGH (ref 70–99)
Potassium: 4.6 mmol/L (ref 3.5–5.1)
Sodium: 132 mmol/L — ABNORMAL LOW (ref 135–145)

## 2015-04-21 NOTE — Progress Notes (Addendum)
[address]       ,[place] [address]             [phone]      16 Days Post-Op Procedure(s) (LRB): AORTIC VALVE REPLACEMENT (AVR) using a 20mm Edwards Aortic Magna Ease Valve  (N/A) TRANSESOPHAGEAL ECHOCARDIOGRAM (TEE) (N/A) MAZE (N/A) REPLACEMENT ASCENDING AORTA with a 32mm Hemashield Platinum Graft (N/A)   Subjective:  Mr. Stockard has no complaints.  He is tolerating a diet without difficulty and questions why another speech and swallow evaluation was ordered.  He continues to ambulate with dyspnea requiring rest breaks.  + BM  Objective: Vital signs in last 24 hours: Temp:  [98.8 F (37.1 C)-99.1 F (37.3 C)] 98.9 F (37.2 C) (05/04 0454) Pulse Rate:  [63-78] 63 (05/04 0454) Cardiac Rhythm:  [-] Junctional rhythm;Atrial fibrillation;Atrial flutter (05/03 2200) Resp:  [20] 20 (05/04 0454) BP: (100-113)/(54-74) 113/74 mmHg (05/04 0454) SpO2:  [94 %-99 %] 96 % (05/04 0454) Weight:  [218 lb 7.6 oz (99.1 kg)] 218 lb 7.6 oz (99.1 kg) (05/04 0454)  Intake/Output from previous day: 05/03 0701 - 05/04 0700 In: 480 [P.O.:480] Out: 2475 [Urine:2475]  General appearance: alert, cooperative and no distress Heart: irregularly irregular rhythm Lungs: clear to auscultation bilaterally Abdomen: soft, non-tender; bowel sounds normal; no masses,  no organomegaly Extremities: edema 1-2+ Wound: clean and dry  Lab Results: No results for input(s): WBC, HGB, HCT, PLT in the last 72 hours. BMET:  Recent Labs  04/20/15 0323 04/21/15 0420  NA 132* 132*  K 5.0 4.6  CL 91* 91*  CO2 30 32  GLUCOSE 104* 114*  BUN 11 12  CREATININE 1.09 1.19  CALCIUM 9.1 8.9    PT/INR: No results for input(s): LABPROT, INR in the last 72 hours. ABG    Component Value Date/Time   PHART 7.403 04/07/2015 1524   HCO3 24.0 04/07/2015 1524   TCO2 25 04/10/2015 1720   ACIDBASEDEF 1.0 04/07/2015 1524   O2SAT 64.9 04/09/2015 0325   CBG (last 3)   Recent Labs   04/20/15 1615 04/20/15 2137 04/21/15 0620  GLUCAP 153* 93 113*    Assessment/Plan: S/P Procedure(s) (LRB): AORTIC VALVE REPLACEMENT (AVR) using a 72mm Edwards Aortic Magna Ease Valve  (N/A) TRANSESOPHAGEAL ECHOCARDIOGRAM (TEE) (N/A) MAZE (N/A) REPLACEMENT ASCENDING AORTA with a 75mm Hemashield Platinum Graft (N/A)  1. CV- remains in Atrial Fibrillation, rate controlled in the 60s continues to have labile blood pressure- beta blocker and Amiodarone discontinued, will d/c Hydralazine, continue Lisinopril with parameters 2. Pulm- wean oxygen as tolerated, CXR without effusion, some LLL consolidation present, continue IS 3. Renal- creatinine mildly elevated at 1.19, significant improvement in LE edema, weight is below baseline will d/c Metalozone, continue Lasix for now 4. ENT- vocal chord paralysis improving, patient denies further dysphagia continue heart healty diet, SSLP in room for repeat eval this morning 5. Deconditioning- patient will require SNF at discharge.  Family is unhappy in regards to not being accepted to CIR in [place].  However, the patient is making too good of progress for inpatient rehab center.  I explained to the patient that he meets admission requirements for further Physical therapy at a SNF facility.  The family is not happy with this and is requesting medical records to see what we are documenting and presenting to the facility that he is felt to be doing to well to be discharged there.  I have personally filled out the request for medical records paperwork  and provided it to the patient. 6. Dispo- patient is medically stable for discharge to SNF facility. However, currently patient and family wishing to go to [facility] in [place].  Social work is assisting in placement of patient.  However if the are unwilling to go to SNF patient could go home with H/H nursing and PT   LOS: 25 days    [name], ERIN 04/21/2015  2-D echocardiogram shows significant improvement in global  LV function, EF now 50-55% up from 35% preop. Aortic valve prosthesis functioned well without significant AI. Small pericardial effusion-continue to hold anticoagulation other than aspirin 325 mg daily  Repeat bedside swallow evaluation by SLT shows no evidence of aspiration His lower leg edema is significantly improved and I agree with stopping metolazone  I've discussed the patient's vocal cord related hoarseness with Dr. Olga Coaster will see the patient back in followup in the office to assess for need of cord injection after time was allowed for recovery  Issues regarding post hospital placement are still unresolved.

## 2015-04-21 NOTE — Telephone Encounter (Signed)
Closing encounter due to patient remains in hospital

## 2015-04-21 NOTE — Progress Notes (Signed)
NUTRITION FOLLOW UP  INTERVENTION: No nutrition intervention warranted at this time RD to continue to follow  NUTRITION DIAGNOSIS: Inadequate oral intake, resolved  Goal: Pt to meet >/= 90% of their estimated nutrition needs, met  Monitor:  PO intake, weight, labs, I/O's  ASSESSMENT: 54 yo Man with PMH of hypertension, atrial flutter, mild AS, moderate AI and nonischemic cardiomyopathy with MUGA EF ~ 35% after LHC 15-20% after being hospitalized several times for CHF having 12/11-12/12/15 admission for RHC/LHC. He presents today with 2 weeks of progressive shortness of breath. No infectious symptoms - no fever/chills/nausea/vomiting/diarrhea. He has 20 feet dyspnea on exertion. He tells me he sleeps on 8 pillows/recliner. He wakes up SOB from sleep. He received IV lasix in the ER. ECG noted to be in atrial flutter with BNP 2700 and Trop 0.1. No current chest pain.   Patient s/p procedures 4/18: AORTIC VALVE REPLACEMENT  MAZE (RIGHT ATRIAL SET) REPLACEMENT ASCENDING AORTA  Pt advanced to Regular diet 4/29.  Appetite good.  PO intake mostly 100% per flowsheet records.  Height: Ht Readings from Last 1 Encounters:  03/28/15 _0  (1.803 m)    Weight: Wt Readings from Last 1 Encounters:  04/21/15 218 lb 7.6 oz (99.1 kg)    BMI:  Body mass index is 30.48 kg/(m^2).  Estimated Nutritional Needs: Kcal: 2200-2400 Protein: 120-130 gm Fluid: per MD  Skin: chest & R axilla surgical incisions   Diet Order: Diet regular Room service appropriate?: Yes; Fluid consistency:: Thin   Intake/Output Summary (Last 24 hours) at 04/21/15 1456 Last data filed at 04/21/15 1238  Gross per 24 hour  Intake    840 ml  Output   3325 ml  Net  -2485 ml    Labs:   Recent Labs Lab 04/18/15 0603 04/20/15 0323 04/21/15 0420  NA 133* 132* 132*  K 4.0 5.0 4.6  CL 97* 91* 91*  CO2 28 30 32  BUN _1 CREATININE 0.94 1.09 1.19  CALCIUM 8.6* 9.1 8.9  GLUCOSE 134* 104* 114*    CBG  (last 3)   Recent Labs  04/20/15 2137 04/21/15 0620 04/21/15 1207  GLUCAP 93 113* 93    Scheduled Meds: . antiseptic oral rinse  7 mL Mouth Rinse q12n4p  . aspirin EC  325 mg Oral Daily   Or  . aspirin  324 mg Per Tube Daily  . atorvastatin  40 mg Oral Daily  . bisacodyl  10 mg Oral Daily   Or  . bisacodyl  10 mg Rectal Daily  . budesonide (PULMICORT) nebulizer solution  0.5 mg Nebulization BID  . chlorhexidine  15 mL Mouth Rinse BID  . chlorpheniramine-HYDROcodone  5 mL Oral Q12H  . dextromethorphan-guaiFENesin  1 tablet Oral BID  . docusate sodium  200 mg Oral Daily  . enoxaparin (LOVENOX) injection  40 mg Subcutaneous Q24H  . furosemide  40 mg Oral Daily  . levalbuterol  0.63 mg Nebulization TID  . lisinopril  10 mg Oral Daily  . metolazone  5 mg Oral Daily  . pantoprazole  40 mg Oral Daily  . potassium chloride  20 mEq Oral BID  . sodium chloride  10-40 mL Intracatheter Q12H  . sodium chloride  3 mL Intravenous Q12H    Continuous Infusions: . lactated ringers Stopped (04/11/15 1258)    Past Medical History  Diagnosis Date  . Insomnia   . CHF (congestive heart failure)   . Congenital insufficiency of aortic valve   . Congestive  cardiomyopathy   . Atrial flutter   . Benign hypertensive heart and renal disease   . Essential hypertension, malignant   . SOB (shortness of breath)   . Murmur   . Aortic stenosis 11/19/2014  . 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 Benign essential HTN 11/19/2014  . Chronic systolic CHF (congestive heart failure) 11/19/2014    Past Surgical History  Procedure Laterality Date  . Left and right heart catheterization with coronary angiogram N/A 11/27/2014    Procedure: LEFT AND RIGHT HEART CATHETERIZATION WITH CORONARY ANGIOGRAM;  Surgeon: [name], MD;  [facility] C[facility] LAB;  Service: Cardiovascular;  Laterality: N/A;  . Aortic valve replacement N/A 04/05/2015    Procedure: AORTIC VALVE REPLACEMENT (AVR) using a 58m  Edwards Aortic Magna Ease Valve ;  Surgeon: PIvin Poot MD;  [facility]  Service: Open Heart Surgery;  Laterality: N/A;  . Tee without cardioversion N/A 04/05/2015    Procedure: TRANSESOPHAGEAL ECHOCARDIOGRAM (TEE);  Surgeon: PIvin Poot MD;  [place] [facility]  Service: Open Heart Surgery;  Laterality: N/A;  . Maze N/A 04/05/2015    Procedure: MAZE;  Surgeon: PIvin Poot MD;  [place] [facility]  Service: Open Heart Surgery;  Laterality: N/A;  . Replacement ascending aorta N/A 04/05/2015    Procedure: REPLACEMENT ASCENDING AORTA with a 364mHemashield Platinum Graft;  Surgeon: PeIvin PootMD;  [facility] Service: Open Heart Surgery;  Laterality: N/A;    KaArthur HolmsRD, LDN Pager #: [phone]-Hours Pager #: [phone]

## 2015-04-21 NOTE — Progress Notes (Signed)
LCSW still following for disposition.  MD and CM called with update that family once to try another other options in [place] that patient would qualify for that accept Medicaid.  Information and contact for Lilia Pro was given and LCSW called Lilia Pro who is the clinical admissions person for [phone] She reports there are a few [facility] facilities that will take referrals for SNF.  Lilia Pro reports there are 2 options close to [facility] that she is going to call and see what there Medicaid status is and if appropriate for referral. Awaiting call back. If appropriate, LCSW will send patient's clinicals for review for SNF.  Will follow up with family once all information reviewed and options exhausted.  Lane Hacker, MSW Clinical Social Work: Emergency Room [phone]

## 2015-04-21 NOTE — Progress Notes (Signed)
Speech Language Pathology Treatment: Dysphagia  Patient Details Name: [name] MRN: [id number] DOB: February 13, 1961 Today's Date: 04/21/2015 Time: 0750-0820 SLP Time Calculation (min) (ACUTE ONLY): 30 min  Assessment / Plan / Recommendation Clinical Impression  Pt seen for treatment of swallowing and voice. Pt has remained on SLP caseload, new eval not necessary as treatment is ongoing. SLP reinforced basic precautions, small sips, second swallow to clear residual, pt initially return demonstrated with 100% accuracy, but when in a more distracting environment intake became more impulsive and sips over large. Verbal cues again provided. Despite this, pt appeared to protect airway well, consistent with finding from prior MBS that despite dysphagia and vocal fold paralysis, improvement in strength and timing now allow pt to swallow thin liquids and regular solids without penetration or aspiration. Pt does have a baseline cough and often coughs after swallows, unrelated to penetration on MBS. Recommend pt continue diet with ongoing f/u for compensatory strategies. SLP also attempted to provide modeling and verbal cues for breath support and slow rate of speech during reading and conversation to improve speech intelligibility. Pt return demonstrated but was reluctant to continue sating he needed to rest his voice. SLP will continue efforts.    HPI Other Pertinent Information:  54 y.o. man underwent aortic valve replacment 4/18.  Pt was intubated for surgery and extubated within 24 hours,on 4/19.  PMH:  obesity, HTN, sliding Hiatal Hernia with reflux.  Clinical swallow evaluation on 4/20 revealed dysphagia with concerns for aspiration.  Orders for FEES 04/09/15.    Pertinent Vitals    SLP Plan  Continue with current plan of care    Recommendations Diet recommendations: Regular;Thin liquid Liquids provided via: Cup Medication Administration: Whole meds with liquid Supervision: Patient able to self  feed Compensations: Slow rate;Multiple dry swallows after each bite/sip Postural Changes and/or Swallow Maneuvers: Seated upright 90 degrees              Oral Care Recommendations: Patient independent with oral care Follow up Recommendations: 24 hour supervision/assistance Plan: Continue with current plan of care    GO    [facility], MA CCC-SLP [phone]  Lynann Beaver 04/21/2015, 8:52 AM

## 2015-04-21 NOTE — Progress Notes (Signed)
Pt resting in chair at this time; pt not wanting to ambulate at this time; will attempt again at a later time; will cont. To monitor.

## 2015-04-21 NOTE — Progress Notes (Signed)
CARDIAC REHAB PHASE I   PRE:  Rate/Rhythm: 60 afib    BP: sitting 108/79    SaO2: 92 RA  MODE:  Ambulation: 690 ft   POST:  Rate/Rhythm: 80    BP: sitting 128/80     SaO2: 92 1L, 85 RA, 94 RA with rest  Pt improving. SaO2 92 RA after awaking from nap. Used 1L for longer walk today and SaO2 maintained 88-92 1L. Rest x2. Return to recliner. Pt happy with his progress.  Garey, Ivey, ACSM 04/21/2015 3:40 PM

## 2015-04-21 NOTE — Progress Notes (Signed)
Pt ambulated 639EV without complication. Pt didn't want to walk any further. encouraged pt to increase ambulation. Monitoring will continue.

## 2015-04-22 LAB — BASIC METABOLIC PANEL
Anion gap: 8 (ref 5–15)
Anion gap: 9 (ref 5–15)
BUN: 17 mg/dL (ref 6–20)
BUN: 15 mg/dL (ref 6–20)
CO2: 31 mmol/L (ref 22–32)
CO2: 29 mmol/L (ref 22–32)
Calcium: 8.7 mg/dL — ABNORMAL LOW (ref 8.9–10.3)
Calcium: 8.8 mg/dL — ABNORMAL LOW (ref 8.9–10.3)
Chloride: 91 mmol/L — ABNORMAL LOW (ref 101–111)
Chloride: 92 mmol/L — ABNORMAL LOW (ref 101–111)
Creatinine, Ser: 1.36 mg/dL — ABNORMAL HIGH (ref 0.61–1.24)
Creatinine, Ser: 1.32 mg/dL — ABNORMAL HIGH (ref 0.61–1.24)
GFR calc Af Amer: >60 mL/min (ref >60)
GFR calc Af Amer: >60 mL/min (ref >60)
GFR calc non Af Amer: 58 mL/min — ABNORMAL LOW (ref >60)
GFR calc non Af Amer: 60 mL/min — ABNORMAL LOW (ref >60)
Glucose, Bld: 120 mg/dL — ABNORMAL HIGH (ref 70–99)
Glucose, Bld: 127 mg/dL — ABNORMAL HIGH (ref 70–99)
Potassium: 6.3 mmol/L (ref 3.5–5.1)
Potassium: 4.4 mmol/L (ref 3.5–5.1)
Sodium: 130 mmol/L — ABNORMAL LOW (ref 135–145)
Sodium: 130 mmol/L — ABNORMAL LOW (ref 135–145)

## 2015-04-22 LAB — GLUCOSE, CAPILLARY
Glucose-Capillary: 107 mg/dL — ABNORMAL HIGH (ref 70–99)
Glucose-Capillary: 110 mg/dL — ABNORMAL HIGH (ref 70–99)
Glucose-Capillary: 102 mg/dL — ABNORMAL HIGH (ref 70–99)
Glucose-Capillary: 123 mg/dL — ABNORMAL HIGH (ref 70–99)

## 2015-04-22 MED ORDER — FUROSEMIDE 20 MG PO TABS
20.0000 mg | ORAL_TABLET | Freq: Every day | ORAL | Status: DC
  Administered 2015-04-23: 20 mg via ORAL
  Filled 2015-04-22: qty 1

## 2015-04-22 MED ORDER — ACETAMINOPHEN 325 MG PO TABS
650.0000 mg | ORAL_TABLET | ORAL | Status: DC | PRN
  Administered 2015-04-22: 650 mg via ORAL
  Filled 2015-04-22 (×2): qty 2

## 2015-04-22 NOTE — Progress Notes (Signed)
Per laboratory call, potassium level was 6.3 and hemolyzed but blood will be re-drawn. Will continue to monitor patient.

## 2015-04-22 NOTE — Progress Notes (Addendum)
LCSW has called and left message with [facility] representative regarding any other options for patient with Medicaid only/SNF. LCSW faxed out to the [facility] today as well in effort to locate bed. Will follow up with family once more is known about his options and what they want to do.  CIR: [facility] not an option CIR: [place] not an option SNF: [place]:  3 options [place] and [place] SNF: [place] area:  Pending, faxed out and will follow up [place]: Is an option. Family also an option, but patient refusing to DC home with sister  LCSW met with patient at the bedside along with CM Kristi about options, plans and patient called sister who was on speaker phone during discussion. Sister still feels like CIR option was blocked and she was made aware new orders to be assessed were completed on 5/4 to which he remains inappropriate for CIR bed. Sister made aware plan is to DC on 5/6 with available options including today's bed search in [place]. Patient also made aware and educated in detail the reason for DC (as he is medically stable) and needing to decide on a disposition. Family reports they will call after they look into their options (this is unknown to CM and SW at this time)  Plan at this time is to DC on Friday with all options given to patient and family.    Lane Hacker, MSW Clinical Social Work: Emergency Room [phone]

## 2015-04-22 NOTE — Progress Notes (Signed)
Faxed release of information paperwork to medical records per request.  Pt given original copy and faxed verification in chart. Payton Emerald, RN

## 2015-04-22 NOTE — Progress Notes (Signed)
PT Cancellation Note  Patient Details Name: [name] MRN: [id number] DOB: March 29, 1961   Cancelled Treatment:    Reason Eval/Treat Not Completed: 1st attempt-- Patient declined, no reason specified; stated he would walk later   2 Patient unavailable (walking with Cardiac Rehab)  Noted plans for discharge 5/6 with destination yet to be determined. Feel continued PT warranted to maximize his independence and improve safety/higher level balance.  Kayna Suppa 04/22/2015, 4:34 PM Pager [phone]

## 2015-04-22 NOTE — Clinical Social Work Placement (Signed)
   CLINICAL SOCIAL WORK PLACEMENT  NOTE  Date:  04/22/2015  Patient Details  Name: [name] MRN: [id number] Date of Birth: [birth year]  Clinical Social Work is seeking post-discharge placement for this patient at the [place] level of care (*CSW will initial, date and re-position this form in  chart as items are completed):  Yes   Patient/family provided with [facility] Work Department's list of facilities offering this level of care within the geographic area requested by the patient (or if unable, by the patient's family).  Yes   Patient/family informed of their freedom to choose among providers that offer the needed level of care, that participate in Medicare, Medicaid or managed care program needed by the patient, have an available bed and are willing to accept the patient.  Yes   Patient/family informed of [facility]'s ownership interest in [facility] and [facility], as well as of the fact that they are under no obligation to receive care at these facilities.  PASRR submitted to EDS on 04/09/15     PASRR number received on       Existing PASRR number confirmed on 04/09/15     FL2 transmitted to all facilities in geographic area requested by pt/family on     04/22/2015  FL2 transmitted to all facilities within larger geographic area on 04/09/15   04/22/2015  Patient informed that his/her managed care company has contracts with or will negotiate with certain facilities, including the following:            Patient/family informed of bed offers received. 04/22/2015   ([facility] options, [facility] and [facility])  SNF beds in [facility]. Pending options given in [place] area  Patient chooses bed at       Physician recommends and patient chooses bed at      Patient to be transferred to   on  .  Patient to be transferred to facility by       Patient family notified on   of transfer.  Name of family member notified:        PHYSICIAN Please sign  FL2     Additional Comment:    _______________________________________________ Lilly Cove, LCSW 04/22/2015, 10:08 AM

## 2015-04-22 NOTE — Progress Notes (Addendum)
[address]       [place] [address]             [phone]      17 Days Post-Op Procedure(s) (LRB): AORTIC VALVE REPLACEMENT (AVR) using a 17mm Edwards Aortic Magna Ease Valve  (N/A) TRANSESOPHAGEAL ECHOCARDIOGRAM (TEE) (N/A) MAZE (N/A) REPLACEMENT ASCENDING AORTA with a 53mm Hemashield Platinum Graft (N/A)   Subjective:  Mr. Staat has no new complaints.  He continues to have hoarseness, dyspnea.  He is ambulating with assistance.  Objective: Vital signs in last 24 hours: Temp:  [98.3 F (36.8 C)-98.9 F (37.2 C)] 98.6 F (37 C) (05/05 0504) Pulse Rate:  [58-73] 73 (05/05 0504) Cardiac Rhythm:  [-] Junctional rhythm;Atrial fibrillation;Atrial flutter (05/04 1930) Resp:  [18-20] 18 (05/05 0504) BP: (109-128)/(45-80) 115/45 mmHg (05/05 0504) SpO2:  [94 %-98 %] 95 % (05/05 0504) Weight:  [216 lb 14.9 oz (98.4 kg)] 216 lb 14.9 oz (98.4 kg) (05/05 0504)  Intake/Output from previous day: 05/04 0701 - 05/05 0700 In: 640 [P.O.:600; I.V.:40] Out: 2300 [Urine:2300]  General appearance: alert, cooperative and no distress Heart: irregularly irregular rhythm Lungs: clear to auscultation bilaterally Abdomen: soft, non-tender; bowel sounds normal; no masses,  no organomegaly Extremities: edema 1-2+ pitting Wound: clean and dry  Lab Results: No results for input(s): WBC, HGB, HCT, PLT in the last 72 hours. BMET:  Recent Labs  04/21/15 0420 04/22/15 0315  NA 132* 130*  K 4.6 6.3*  CL 91* 91*  CO2 32 31  GLUCOSE 114* 120*  BUN 12 17  CREATININE 1.19 1.36*  CALCIUM 8.9 8.7*    PT/INR: No results for input(s): LABPROT, INR in the last 72 hours. ABG    Component Value Date/Time   PHART 7.403 04/07/2015 1524   HCO3 24.0 04/07/2015 1524   TCO2 25 04/10/2015 1720   ACIDBASEDEF 1.0 04/07/2015 1524   O2SAT 64.9 04/09/2015 0325   CBG (last 3)   Recent Labs  04/21/15 1649 04/21/15 2105 04/22/15 0609  GLUCAP 135* 94 107*     Assessment/Plan: S/P Procedure(s) (LRB): AORTIC VALVE REPLACEMENT (AVR) using a 13mm Edwards Aortic Magna Ease Valve  (N/A) TRANSESOPHAGEAL ECHOCARDIOGRAM (TEE) (N/A) MAZE (N/A) REPLACEMENT ASCENDING AORTA with a 34mm Hemashield Platinum Graft (N/A)  1. CV- remains in rate controlled Atrial Fibrillation, pressure remains labile- hold beta blockers, Lisinopril ordered with parameters 2. Pulm- off oxygen, continue IS 3. Renal- creatinine slowly trending up, weight is below baseline, continued LE edema continue Lasix, Hyperkalemia however potassium was hemolyzed, I suspect this is not accurate as patient has been aggressively diuresed for several days, will repeat K 4. ENT- vocal chord paralysis stable, ENT to follow as outpatient 5. Deconditioning- patient and family awaiting further bed offers in [place], hopefully can get approval soon for SNF.  Patient also request consult be placed for CIR here at [facility] 6. Dispo- patient continues to progress, off oxygen, continue rate controlled A. Fib, diuresis, stable for d/c once plans can be made  LOS: 26 days    [name], ERIN 04/22/2015  Reduce Lasix dose to 20 mg daily and follow-up BMP in a.m. for recent slight rising creatinine and potassium  Social worker is working with family to locate skilled nursing facility in the [place] area  Surgical incisions healing  Patient needs appointment made to see Dr. Arville Care at [facility] in early [place] left vocal cord dysfunction  patient examined and medical record reviewed,agree with above note. Tharon Aquas  Trigt III 04/22/2015

## 2015-04-22 NOTE — Progress Notes (Signed)
CARDIAC REHAB PHASE I   PRE:  Rate/Rhythm: 71     BP: sitting 97/69    SaO2: 93 RA  MODE:  Ambulation: 490 ft   POST:  Rate/Rhythm: 79    BP: sitting 110/77     SaO2: wouldn't register  Pt ready to ambulate on third attempt today. Attempted RA walking however SaO2 85 Ra after coughing during walk. Applied 1L. SaO2 would not register rest of walk or after walk. Left on RA as his SaO2 has been >90 at rest. Pt got visitors during walk which made him happy. [place], [place], ACSM 04/22/2015 2:41 PM

## 2015-04-22 NOTE — Progress Notes (Signed)
Thank you for consult on [name]. Chart reviewed and note that patient is modified independent to supervision level at this time. He is too high level for CIR and agree with recommendations for home with 24 hours supervision due to cognitive issues or SNF past discharge. Question RH candidate if he does not qualify for SNF.

## 2015-04-23 ENCOUNTER — Inpatient Hospital Stay (HOSPITAL_COMMUNITY): Payer: Medicaid Other

## 2015-04-23 LAB — CBC
HCT: 27.8 % — ABNORMAL LOW (ref 39.0–52.0)
Hemoglobin: 9 g/dL — ABNORMAL LOW (ref 13.0–17.0)
MCH: 25.4 pg — ABNORMAL LOW (ref 26.0–34.0)
MCHC: 32.4 g/dL (ref 30.0–36.0)
MCV: 78.3 fL (ref 78.0–100.0)
Platelets: 611 10*3/uL — ABNORMAL HIGH (ref 150–400)
RBC: 3.55 MIL/uL — ABNORMAL LOW (ref 4.22–5.81)
RDW: 15.5 % (ref 11.5–15.5)
WBC: 11.1 10*3/uL — ABNORMAL HIGH (ref 4.0–10.5)

## 2015-04-23 LAB — BASIC METABOLIC PANEL
Anion gap: 10 (ref 5–15)
BUN: 14 mg/dL (ref 6–20)
CO2: 29 mmol/L (ref 22–32)
Calcium: 9.3 mg/dL (ref 8.9–10.3)
Chloride: 94 mmol/L — ABNORMAL LOW (ref 101–111)
Creatinine, Ser: 1.34 mg/dL — ABNORMAL HIGH (ref 0.61–1.24)
GFR calc Af Amer: >60 mL/min (ref >60)
GFR calc non Af Amer: 59 mL/min — ABNORMAL LOW (ref >60)
Glucose, Bld: 155 mg/dL — ABNORMAL HIGH (ref 70–99)
Potassium: 4 mmol/L (ref 3.5–5.1)
Sodium: 133 mmol/L — ABNORMAL LOW (ref 135–145)

## 2015-04-23 LAB — GLUCOSE, CAPILLARY
GLUCOSE-CAPILLARY: 120 mg/dL — AB (ref 70–99)
GLUCOSE-CAPILLARY: 109 mg/dL — AB (ref 70–99)
Glucose-Capillary: 115 mg/dL — ABNORMAL HIGH (ref 70–99)

## 2015-04-23 MED ORDER — FUROSEMIDE 20 MG PO TABS: 20.0000 mg | ORAL_TABLET | Freq: Every day | ORAL | Status: DC

## 2015-04-23 MED ORDER — ASPIRIN 325 MG PO TBEC: 325.0000 mg | DELAYED_RELEASE_TABLET | Freq: Every day | ORAL | Status: DC

## 2015-04-23 MED ORDER — OXYCODONE HCL 5 MG PO TABS: 5.0000 mg | ORAL_TABLET | ORAL | Status: DC | PRN

## 2015-04-23 MED ORDER — LISINOPRIL 10 MG PO TABS: 10.0000 mg | ORAL_TABLET | Freq: Every day | ORAL | Status: DC

## 2015-04-23 MED ORDER — LISINOPRIL 5 MG PO TABS: 5.0000 mg | ORAL_TABLET | Freq: Every day | ORAL | Status: DC

## 2015-04-23 MED ORDER — DIPHENHYDRAMINE HCL 25 MG PO CAPS: 25.0000 mg | ORAL_CAPSULE | Freq: Every evening | ORAL | Status: DC | PRN

## 2015-04-23 NOTE — Progress Notes (Signed)
Removed CT sutures and applied benzoin and 1/2 " steri strips.  Pt tolerated procedure well. Pt given signs and symptoms of infection. Anisia Leija McClintock, RN    

## 2015-04-23 NOTE — Progress Notes (Signed)
Speech Language Pathology Treatment: Dysphagia  Patient Details Name: [name] MRN: [id number] DOB: 1961/09/24 Today's Date: 04/23/2015 Time: 1100-1110 SLP Time Calculation (min) (ACUTE ONLY): 10 min  Assessment / Plan / Recommendation Clinical Impression  Pt for potential D/C to SNF today.  Is tolerating a regular diet with thin liquids; still requires some basic precautions (min cues) to maximize airway protection due to vocal cord paralysis, but pt denies having ongoing difficulty.  Lungs are diminished but clear.  Persisting hoarseness impacts communication.  Pt continues to present with cognitive deficits, impaired insight.  SLP to follow pending D/C.   HPI Other Pertinent Information:  54 y.o. man underwent aortic valve replacment 4/18.  Pt was intubated for surgery and extubated within 24 hours,on 4/19.  PMH:  obesity, HTN, sliding Hiatal Hernia with reflux.  Clinical swallow evaluation on 4/20 revealed dysphagia with concerns for aspiration.  Orders for FEES 04/09/15.    Pertinent Vitals Pain Assessment: No/denies pain  SLP Plan  Continue with current plan of care    Recommendations Diet recommendations: Regular;Thin liquid Liquids provided via: Cup Medication Administration: Whole meds with liquid Supervision: Patient able to self feed Compensations: Slow rate;Multiple dry swallows after each bite/sip Postural Changes and/or Swallow Maneuvers: Seated upright 90 degrees              Oral Care Recommendations: Patient independent with oral care Follow up Recommendations: 24 hour supervision/assistance Plan: Continue with current plan of care       Juan Quam Laurice 04/23/2015, 11:18 AM

## 2015-04-23 NOTE — Progress Notes (Signed)
Pt/family given discharge instructions, medication lists, follow up appointments, and when to call the doctor.  Pt/family verbalizes understanding. Pt given signs and symptoms of infection. Patient had medication bottles at bedside from home. I explained that coreg, plavix and hydralazine well to be stopped per MD. All questions answered. Called CSW Jodie to let know that patient was ready for transport. Payton Emerald, RN

## 2015-04-23 NOTE — Progress Notes (Signed)
Called [facility] at [phone] and gave report to RN Bethena Roys.  All questions answered.  Patient waiting in room with sister for transport via [facility]. Called [facility] at [phone] for verification patient on list.  [facility] operator stated he was next on the list. Kalman Shan, Baird Kay, RN

## 2015-04-23 NOTE — Clinical Social Work Placement (Signed)
   CLINICAL SOCIAL WORK PLACEMENT  NOTE  Date:  04/23/2015  Patient Details  Name: [name] MRN: [id number] Date of Birth: 1961/06/06  Clinical Social Work is seeking post-discharge placement for this patient at the [place] level of care (*CSW will initial, date and re-position this form in  chart as items are completed):  Yes   Patient/family provided with [facility] Work Department's list of facilities offering this level of care within the geographic area requested by the patient (or if unable, by the patient's family).  Yes   Patient/family informed of their freedom to choose among providers that offer the needed level of care, that participate in Medicare, Medicaid or managed care program needed by the patient, have an available bed and are willing to accept the patient.  Yes   Patient/family informed of [facility]'s ownership interest in [facility] and [facility], as well as of the fact that they are under no obligation to receive care at these facilities.  PASRR submitted to EDS on 04/09/15     PASRR number received on       Existing PASRR number confirmed on 04/09/15     FL2 transmitted to all facilities in geographic area requested by pt/family on       FL2 transmitted to all facilities within larger geographic area on 04/09/15     Patient informed that his/her managed care company has contracts with or will negotiate with certain facilities, including the following:            Patient/family informed of bed offers received.  Patient [facility] bed at  ([facility])     Physician recommends and patient chooses bed at      Patient to be transferred to  ([facility]) on 04/23/15.  Patient to be transferred to facility by t     Patient family notified on 04/23/15 of transfer.  Name of family member notified:  sister-Jackie     PHYSICIAN Please sign FL2     Additional Comment:     _______________________________________________ Roanna Raider, LCSW 04/23/2015, 8:47 PM

## 2015-04-23 NOTE — Progress Notes (Addendum)
CSW (Clinical Education officer, museum) provided pt sister with bed offers from [facility] facilities and [facility]. Pt sister aware that pt is ready for dc today and decision is needed today.  ADDENDUM 11:30pm: CSW visited pt room and also informed pt of plan for dc and available bed offers. CSW asked if pt would like for representative from [facility] to come speak with him to help with decision. Pt upset with CSW and said he did not want this arranged. Pt stated he does not want to dc today. CSW once again informed pt that pt does have dc order and will need to choose facility today. After leaving pt room, CSW discussed case with CSW supervisor and notified of potential issues with pt dc.  [place], [place]

## 2015-04-23 NOTE — Progress Notes (Addendum)
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 has no new complaints this morning.  He continues to await SNF placement.  + BM  Objective: Vital signs in last 24 hours: Temp:  [97.5 F (36.4 C)-100.4 F (38 C)] 98.5 F (36.9 C) (05/06 0341) Pulse Rate:  [64-76] 71 (05/06 0341) Cardiac Rhythm:  [-] Atrial fibrillation (05/05 2346) Resp:  [18] 18 (05/06 0341) BP: (115-121)/(38-51) 115/38 mmHg (05/06 0341) SpO2:  [95 %-100 %] 96 % (05/06 0341) Weight:  [212 lb 8.4 oz (96.4 kg)] 212 lb 8.4 oz (96.4 kg) (05/06 0341)  Intake/Output from previous day: 05/05 0701 - 05/06 0700 In: 40 [I.V.:40] Out: 2920 [Urine:2920]  General appearance: alert, cooperative and no distress Heart: irregularly irregular rhythm Lungs: clear to auscultation bilaterally Abdomen: soft, non-tender; bowel sounds normal; no masses,  no organomegaly Extremities: edema 1-2+ Wound: clean and dry  Lab Results:  Recent Labs  04/23/15 0430  WBC 11.1*  HGB 9.0*  HCT 27.8*  PLT 611*   BMET:  Recent Labs  04/22/15 1011 04/23/15 0430  NA 130* 133*  K 4.4 4.0  CL 92* 94*  CO2 29 29  GLUCOSE 127* 155*  BUN 15 14  CREATININE 1.32* 1.34*  CALCIUM 8.8* 9.3    PT/INR: No results for input(s): LABPROT, INR in the last 72 hours. ABG    Component Value Date/Time   PHART 7.403 04/07/2015 1524   HCO3 24.0 04/07/2015 1524   TCO2 25 04/10/2015 1720   ACIDBASEDEF 1.0 04/07/2015 1524   O2SAT 64.9 04/09/2015 0325   CBG (last 3)   Recent Labs  04/22/15 1625 04/22/15 2151 04/23/15 0635  GLUCAP 102* 123* 120*    Assessment/Plan: S/P Procedure(s) (LRB): AORTIC VALVE REPLACEMENT (AVR)  using a 29mm Edwards Aortic Magna Ease Valve  (N/A) TRANSESOPHAGEAL ECHOCARDIOGRAM (TEE) (N/A) MAZE (N/A) REPLACEMENT ASCENDING AORTA with a 39mm Hemashield Platinum Graft (N/A)  1. CV- Atrial Fibrillation rate controlled- no beta blockers, Lisinopril ordered with parameters 2. Pulm- no acute issues, continue IS 3. Renal- creatinine stable, remains edematous in LE- continue oral Lasix, TED hose have been ordered but patient is not wearing them 4. ENT- vocal chord paralysis stable- will f/u with ENT in a few weeks, Dysphagia resolved tolerating diet 5. Deconditioning- patient planning for SNF discharge, suppose to have bed decision today 6. Dispo- patient stable, low grade temp last night no obvious source of infection, continue to hold Beta Blocker, will d/c today once dispo finalized with family and social work    LOS: 27 days    [name], [place] 04/23/2015  Patient examined Low-grade fever last p.m. 100.4. Chest x-ray today shows no new airspace disease, platelike atelectasis at right lung base stable and resolving. White count today normal. No further temperature elevation. Surgical incisions all inspected and are clean and dry. Patient eating breakfast without difficulty. His cough appears to be improved. He is currently in a sinus rhythm this morning  Since patient lives alone he will need discharged to a skilled nursing facility for medical safety. Placement is being discussed with patient's family and Education officer, museum.  I  reviewed the discharge instructions again with the patient including instructions for wound care, medications, diet, activity limitations, followup appointments, and how to call our office for problems once he leaves the hospital.  .patient examined and medical record reviewed,agree with above note. [name] 04/23/2015

## 2015-04-27 ENCOUNTER — Encounter: Payer: Self-pay | Admitting: Internal Medicine

## 2015-04-27 ENCOUNTER — Non-Acute Institutional Stay (SKILLED_NURSING_FACILITY): Payer: Medicaid Other | Admitting: Internal Medicine

## 2015-04-27 DIAGNOSIS — I4892 Unspecified atrial flutter: Secondary | ICD-10-CM

## 2015-04-27 DIAGNOSIS — R531 Weakness: Secondary | ICD-10-CM | POA: Diagnosis not present

## 2015-04-27 DIAGNOSIS — J209 Acute bronchitis, unspecified: Secondary | ICD-10-CM

## 2015-04-27 DIAGNOSIS — I5022 Chronic systolic (congestive) heart failure: Secondary | ICD-10-CM | POA: Diagnosis not present

## 2015-04-27 DIAGNOSIS — N182 Chronic kidney disease, stage 2 (mild): Secondary | ICD-10-CM

## 2015-04-27 DIAGNOSIS — I420 Dilated cardiomyopathy: Secondary | ICD-10-CM

## 2015-04-27 DIAGNOSIS — Z952 Presence of prosthetic heart valve: Secondary | ICD-10-CM

## 2015-04-27 DIAGNOSIS — Z954 Presence of other heart-valve replacement: Secondary | ICD-10-CM

## 2015-04-27 DIAGNOSIS — I10 Essential (primary) hypertension: Secondary | ICD-10-CM

## 2015-04-27 NOTE — Progress Notes (Signed)
Patient ID: [name], male   DOB: 17-Aug-1961, 54 y.o.   MRN: [id number]    HISTORY AND PHYSICAL   DATE: 04/27/15  Location:  [place] of Service: SNF [phone])   Extended Emergency Contact Information Primary Emergency Contact: [place] Work Phone: [phone] Mobile Phone: [phone] Relation: Spouse Secondary Emergency Contact: [place] Mobile Phone: [phone] Relation: Sister  Advanced Directive information   FULL CODE  Chief Complaint  Patient presents with  . New Admit To SNF    HPI:  54 yo male seen today as a new admission into SNF following hospital stay for bicuspid aortic valve with aortic stenosis s/p tissue valve replacement and aortoplasty for ascending thoracic aneurysm, aflutter s/p MAZE procedure, cardiomyopathy with initial EF 10-15%, CHF and acute/CKD. AVR with aortoplasty and MAZE procedure done on 4/18th. No postop complications and he able to be weaned off vent and gtts on POD#1. He continued to experience dysphagia (present prior to procedure) and was seen by ST who recommended NPO and he rec'd TPN. ENT consulted and scope revealed paralyzed left vocal cord which was tx non surgically with ST. He was able to slowly be weaned off TPN and he began tolerating po diet. It was recommended that he cont ST as well as PT/OT at SNF. He will have a repeat CXR on 5/25th. Cr at d/c 1.4.   Today, he c/o cough and congestion but no overt SOB or wheezing. He has not used albuterol HFA. No f/c, HA or dizziness. No sick contacts. No bleeding or d/c for surgical site. Pain controlled on oxycodone.  BP controlled on lisinopril. He lasix for edema as well sa a potassium supplement  He has DCM/systolic HF and takes a statin daily and ASA daily  Zantac controls acid reflux  He takes B12 supplement  Past Medical History  Diagnosis Date  . Insomnia   . CHF  (congestive heart failure)   . Congenital insufficiency of aortic valve   . Congestive cardiomyopathy   . Atrial flutter   . Benign hypertensive heart and renal disease   . Essential hypertension, malignant   . SOB (shortness of breath)   . Murmur   . Aortic stenosis 11/19/2014  . 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 Benign essential HTN 11/19/2014  . Chronic systolic CHF (congestive heart failure) 11/19/2014    Past Surgical History  Procedure Laterality Date  . Left and right heart catheterization with coronary angiogram N/A 11/27/2014    Procedure: LEFT AND RIGHT HEART CATHETERIZATION WITH CORONARY ANGIOGRAM;  Surgeon: [name], MD;  Location: [facility] CATH LAB;  Service: Cardiovascular;  Laterality: N/A;  . Aortic valve replacement N/A 04/05/2015    Procedure: AORTIC VALVE REPLACEMENT (AVR) using a 46mm Edwards Aortic Magna Ease Valve ;  Surgeon: [name], MD;  Location: [place];  Service: Open Heart Surgery;  Laterality: N/A;  . Tee without cardioversion N/A 04/05/2015    Procedure: TRANSESOPHAGEAL ECHOCARDIOGRAM (TEE);  Surgeon: [name], MD;  Location: [place];  Service: Open Heart Surgery;  Laterality: N/A;  . Maze N/A 04/05/2015    Procedure: MAZE;  Surgeon: [name], MD;  Location: [place];  Service: Open Heart Surgery;  Laterality: N/A;  . Replacement ascending aorta N/A 04/05/2015    Procedure: REPLACEMENT ASCENDING AORTA with a 31mm Hemashield Platinum Graft;  Surgeon: [name], MD;  Location: [place];  Service: Open  Heart Surgery;  Laterality: N/A;    No care team member to display  History   Social History  . Marital Status: Single    Spouse Name: N/A  . Number of Children: N/A  . Years of Education: N/A   Occupational History  . Not on file.   Social History Main Topics  . Smoking status: Never Smoker   . Smokeless tobacco: Not on file  . Alcohol Use: Not on file  . Drug Use: Not on file  . Sexual Activity: Not on file   Other  Topics Concern  . Not on file   Social History Narrative     reports that he has never smoked. He does not have any smokeless tobacco history on file. His alcohol and drug histories are not on file.  Family History  Problem Relation Age of Onset  . Heart attack Neg Hx    Family Status  Relation Status Death Age  . Mother Deceased   . Father Deceased      There is no immunization history on file for this patient.  No Known Allergies  Medications: Patient's Medications  New Prescriptions   No medications on file  Previous Medications   ALBUTEROL (PROVENTIL HFA;VENTOLIN HFA) 108 (90 BASE) MCG/ACT INHALER    Inhale 2 puffs into the lungs every 4 (four) hours as needed for wheezing or shortness of breath.   ASPIRIN EC 325 MG EC TABLET    Take 1 tablet (325 mg total) by mouth daily.   ATORVASTATIN (LIPITOR) 40 MG TABLET    Take 40 mg by mouth daily.   DIPHENHYDRAMINE (BENADRYL) 25 MG CAPSULE    Take 1 capsule (25 mg total) by mouth at bedtime as needed for sleep.   FUROSEMIDE (LASIX) 20 MG TABLET    Take 1 tablet (20 mg total) by mouth daily.   LISINOPRIL (PRINIVIL,ZESTRIL) 5 MG TABLET    Take 1 tablet (5 mg total) by mouth daily.   OXYCODONE (OXY IR/ROXICODONE) 5 MG IMMEDIATE RELEASE TABLET    Take 1-2 tablets (5-10 mg total) by mouth every 3 (three) hours as needed for severe pain.   POTASSIUM CHLORIDE SA (KLOR-CON M20) 20 MEQ TABLET    Take 1 tablet (20 mEq total) by mouth daily.   RANITIDINE (ZANTAC) 150 MG CAPSULE    Take 150 mg by mouth daily.   VITAMIN B-12 (CYANOCOBALAMIN) 500 MCG TABLET    Take 500 mcg by mouth daily.  Modified Medications   No medications on file  Discontinued Medications   No medications on file    Review of Systems  Constitutional: Negative for fever, chills, activity change and fatigue.  HENT: Positive for congestion. Negative for sore throat and trouble swallowing.   Eyes: Negative for visual disturbance.  Respiratory: Positive for cough.  Negative for chest tightness and shortness of breath.   Cardiovascular: Positive for leg swelling. Negative for chest pain and palpitations.  Gastrointestinal: Negative for nausea, vomiting, abdominal pain and blood in stool.  Genitourinary: Negative for urgency, frequency and difficulty urinating.  Musculoskeletal: Negative for arthralgias and gait problem.  Skin: Negative for rash.  Neurological: Negative for weakness and headaches.  Psychiatric/Behavioral: Negative for confusion and sleep disturbance. The patient is not nervous/anxious.     Filed Vitals:   04/27/15 1619  BP: 146/69  Pulse: 64  Temp: 98.2 F (36.8 C)  Weight: 211 lb (95.709 kg)   Body mass index is 29.44 kg/(m^2).  Physical Exam  Constitutional: He is oriented  to person, place, and time. He appears well-developed and well-nourished. He appears distressed.  (+) conversational dyspnea  HENT:  Mouth/Throat: Oropharynx is clear and moist.  Eyes: Pupils are equal, round, and reactive to light. No scleral icterus.  Neck: Neck supple. Carotid bruit is not present. No thyromegaly present.  Cardiovascular: Normal rate and intact distal pulses.  An irregularly irregular rhythm present. Exam reveals no gallop and no friction rub.   Murmur (with aortic click) heard.  Systolic murmur is present with a grade of 1/6  +1 pitting LE edema b/l. No calf TTP  Pulmonary/Chest: Effort normal. He has wheezes (b/l end expiratory with prolonged expiratory phase). He has no rales. He exhibits no tenderness.  Abdominal: Soft. Bowel sounds are normal. He exhibits no distension, no abdominal bruit, no pulsatile midline mass and no mass. There is no tenderness. There is no rebound and no guarding.  Musculoskeletal: He exhibits edema and tenderness.  Lymphadenopathy:    He has no cervical adenopathy.  Neurological: He is alert and oriented to person, place, and time. He has normal reflexes.  Skin: Skin is warm and dry. No rash noted.    Psychiatric: He has a normal mood and affect. His behavior is normal. Judgment and thought content normal.     Labs reviewed: Admission on 03/27/2015, Discharged on 04/23/2015  No results displayed because visit has over 200 results.     CBC Latest Ref Rng 04/23/2015 04/13/2015 04/12/2015  WBC 4.0 - 10.5 K/uL 11.1(H) 13.7(H) 16.4(H)  Hemoglobin 13.0 - 17.0 g/dL 9.0(L) 8.6(L) 9.2(L)  Hematocrit 39.0 - 52.0 % 27.8(L) 26.7(L) 28.8(L)  Platelets 150 - 400 K/uL 611(H) 430(H) 377    CMP Latest Ref Rng 04/23/2015 04/22/2015 04/22/2015  Glucose 70 - 99 mg/dL 155(H) 127(H) 120(H)  BUN 6 - 20 mg/dL 14 15 17   Creatinine 0.61 - 1.24 mg/dL 1.34(H) 1.32(H) 1.36(H)  Sodium 135 - 145 mmol/L 133(L) 130(L) 130(L)  Potassium 3.5 - 5.1 mmol/L 4.0 4.4 6.3(HH)  Chloride 101 - 111 mmol/L 94(L) 92(L) 91(L)  CO2 22 - 32 mmol/L 29 29 31   Calcium 8.9 - 10.3 mg/dL 9.3 8.8(L) 8.7(L)  Total Protein 6.0 - 8.3 g/dL - - -  Total Bilirubin 0.3 - 1.2 mg/dL - - -  Alkaline Phos 39 - 117 U/L - - -  AST 0 - 37 U/L - - -  ALT 0 - 53 U/L - - -      Dg Orthopantogram  03/30/2015   CLINICAL DATA:  Aortic stenosis. Patient for surgery. Question dental disease.  EXAM: ORTHOPANTOGRAM/PANORAMIC  COMPARISON:  None.  FINDINGS: No cavity or evidence of periapical abscess is identified. The patient is missing a premolar in the mandible on the right and a mandibular molar on the left.  IMPRESSION: Negative for dental disease.   Electronically Signed   By: Inge Rise M.D.   On: 03/30/2015 18:43   Dg Chest 2 View  04/23/2015   CLINICAL DATA:  CABG.  Fever.  EXAM: CHEST  2 VIEW  COMPARISON:  04/21/2015 .  FINDINGS: Right PICC line stable position. Mediastinum hilar structures are stable. Prior median sternotomy and cardiac valve replacement. Stable severe cardiomegaly. No pulmonary venous congestion. Persistent unchanged bibasilar atelectasis and/or infiltrates. No prominent pleural effusion. No pneumothorax. Surgical clips right  upper chest. No acute bony abnormality.  IMPRESSION: 1. PICC line in stable position. 2. Persistent unchanged bibasilar subsegmental atelectasis and/or infiltrates. 3. Prior median sternotomy and cardiac valve replacement. Stable severe cardiomegaly. No pulmonary  venous congestion.   Electronically Signed   By: Marcello Moores  Register   On: 04/23/2015 07:03   Dg Chest 2 View  04/21/2015   CLINICAL DATA:  Dyspnea  EXAM: CHEST  2 VIEW  COMPARISON:  04/16/2015  FINDINGS: There is marked unchanged cardiomegaly. There is prior sternotomy and cardiac valvuloplasty. There is a right upper extremity PICC line extending into the low SVC. There is unchanged left base airspace consolidation. There is unchanged linear right base opacity which may be atelectatic. No effusions are evident. There is no pneumothorax.  IMPRESSION: Unchanged left base consolidation and right base linear opacities. Marked cardiomegaly.   Electronically Signed   By: Andreas Newport M.D.   On: 04/21/2015 06:20   Dg Chest 2 View  04/14/2015   CLINICAL DATA:  Shortness of breath.  EXAM: CHEST  2 VIEW  COMPARISON:  03/20/2015.  FINDINGS: Interim removal of right IJ sheath. Right PICC line in stable position. Stable severe cardiomegaly. Prior median sternotomy and aortic valve replacement. Stable dense bibasilar atelectasis. Small pleural effusions cannot be excluded. No pneumothorax. Surgical staples and clips right chest.  IMPRESSION: 1. Interim removal right IJ sheath. Right PICC line stable position. 2. Prior median sternotomy and aortic valve replacement. Severe stable cardiomegaly. No pulmonary venous congestion. 3. Persistent dense bibasilar subsegmental atelectasis and/or infiltrates. Tiny small bilateral effusions. No pneumothorax.   Electronically Signed   By: Marcello Moores  Register   On: 04/14/2015 08:06   Dg Chest 2 View  04/11/2015   CLINICAL DATA:  Aortic valve replacement. Followup chest radiograph.  EXAM: CHEST  2 VIEW  COMPARISON:  04/10/2015.   FINDINGS: Support apparatus: Unchanged RIGHT IJ vascular sheath. Monitoring leads project over the chest.  Cardiomediastinal Silhouette: Enlarged but unchanged. Bioprosthetic aortic valve. Tortuous thoracic aorta.  Lungs: Discoid atelectasis at the RIGHT lung base. Dense retrocardiac opacification, likely representing atelectasis and probable effusion. No visible pneumothorax.  Effusions: RIGHT costophrenic angle sharp. No definite RIGHT pleural effusion.  Other: Staples overlie the RIGHT chest. Surgical clips are present in the RIGHT hemithorax.  IMPRESSION: Little if any interval change compared to yesterday's examination. Cardiomegaly and aortic valve replacement. Bilateral basilar opacity most compatible with atelectasis.   Electronically Signed   By: Dereck Ligas M.D.   On: 04/11/2015 10:16   Dg Chest 2 View  04/04/2015   CLINICAL DATA:  Patient with congestive heart failure. Aortic valve insufficiency.  EXAM: CHEST  2 VIEW  COMPARISON:  Chest radiograph 03/27/2015  FINDINGS: Stable cardiomegaly. Tortuosity of the thoracic aorta. No consolidative pulmonary opacities. No pleural effusion or pneumothorax. Minimal left mid lung scarring and/or atelectasis. Mild degenerative changes mid thoracic spine.  IMPRESSION: Cardiomegaly.  No acute cardiopulmonary process.   Electronically Signed   By: Lovey Newcomer M.D.   On: 04/04/2015 14:44   Ct Chest Wo Contrast  03/30/2015   CLINICAL DATA:  Aortic stenosis.  Aortic insufficiency.  EXAM: CT CHEST WITHOUT CONTRAST  TECHNIQUE: Multidetector CT imaging of the chest was performed following the standard protocol without IV contrast.  COMPARISON:  None.  FINDINGS: There is cardiomegaly. Dense calcifications noted in the region of the aortic valve. Mild aneurysmal dilatation of the ascending thoracic aorta measured up to 4.9 cm. Proximal descending thoracic aorta 3.1 cm.  No mediastinal, hilar, or axillary adenopathy. Small scattered mediastinal lymph nodes, none  pathologically enlarged. Chest wall soft tissues are unremarkable.  Areas of atelectasis or scarring noted in the lung bases bilaterally as well as the lingula. No pleural effusions.  No acute bony abnormality or focal bone lesion.  IMPRESSION: Cardiomegaly.  Calcifications in the region of the aortic valve.  4.9 cm ascending thoracic aortic aneurysm. Recommend semi-annual imaging followup by CTA or MRA and referral to cardiothoracic surgery if not already obtained. This recommendation follows 2010 ACCF/AHA/AATS/ACR/ASA/SCA/SCAI/SIR/STS/SVM Guidelines for the Diagnosis and Management of Patients With Thoracic Aortic Disease. Circulation. 2010; 121: V400-Q676   Electronically Signed   By: Rolm Baptise M.D.   On: 03/30/2015 18:38   Dg Esophagus  03/30/2015   CLINICAL DATA:  Dysphagia.  EXAM: ESOPHOGRAM/BARIUM SWALLOW  TECHNIQUE: Combined double contrast and single contrast examination performed using effervescent crystals, thick barium liquid, and thin barium liquid.  FLUOROSCOPY TIME:  Radiation Exposure Index (as provided by the fluoroscopic device): 110.55 uGy m2  If the device does not provide the exposure index:  Fluoroscopy Time:  2 min and 2 seconds.  Number of Acquired Images:  COMPARISON:  None.  FINDINGS: Initial barium swallows demonstrate normal pharyngeal motion with swallowing. No laryngeal penetration or aspiration. No upper esophageal webs, strictures or diverticuli.  Normal esophageal motility. No intrinsic or extrinsic mass lesions. No mucosal abnormalities. Small sliding-type hiatal hernia with inducible GE reflux with water swallowing. No mass or stricture.  IMPRESSION: Small sliding-type hiatal hernia and inducible GE reflux but no mass or stricture.   Electronically Signed   By: Marijo Sanes M.D.   On: 03/30/2015 10:27   Dg Chest Port 1 View  04/16/2015   CLINICAL DATA:  54 year old male with shortness of breath and weakness. Initial encounter. Recent aortic valve replacement and ascending  aortic aneurysm repair.  EXAM: PORTABLE CHEST - 1 VIEW  COMPARISON:  04/14/2015 and earlier.  FINDINGS: Upright seated AP portable view at 0714 hours. Stable cardiomegaly and mediastinal contours. Stable right PICC line. Some of the right chest skin staples have been removed. Continued veiling opacity at the left lung base and platelike atelectasis on the right. Median sternotomy and prosthetic cardiac valve Re identified. No pneumothorax or pulmonary edema.  IMPRESSION: Stable with small to moderate left pleural effusion and bibasilar atelectasis.   Electronically Signed   By: Genevie Ann M.D.   On: 04/16/2015 07:56   Dg Chest Port 1 View  04/11/2015   CLINICAL DATA:  Central line placement.  EXAM: PORTABLE CHEST - 1 VIEW  COMPARISON:  04/11/2015.  FINDINGS: New RIGHT upper extremity PICC is present. The tip is at the junction of the superior vena cava and RIGHT atrium. Median sternotomy and aortic valve replacement. Discoid atelectasis the RIGHT lung base. Monitoring leads project over the chest. RIGHT IJ vascular sheath has been retracted. Cardiomegaly remains present.  IMPRESSION: New RIGHT upper extremity PICC with the tip at the junction of the superior vena cava and RIGHT atrium.   Electronically Signed   By: Dereck Ligas M.D.   On: 04/11/2015 17:21   Dg Chest Port 1 View  04/10/2015   CLINICAL DATA:  Five days postop aortic valve replacement for aortic stenosis. Followup basilar atelectasis.  EXAM: PORTABLE CHEST - 1 VIEW  COMPARISON:  Portable chest x-rays yesterday and earlier.  FINDINGS: Sternotomy for aortic valve replacement. Cardiac silhouette markedly enlarged, unchanged. Pulmonary vascularity normal. Dense atelectasis in the left lower lobe with possible left pleural effusion, unchanged. Moderate linear atelectasis at the right lung base, unchanged. No new pulmonary parenchymal abnormalities. Right jugular introducer sheath tip projects at the junction of the jugular vein and innominate vein,  unchanged.  IMPRESSION: 1. Stable dense left lower  lobe atelectasis and possible left pleural effusion. 2. Stable moderate right basilar atelectasis. 3. No new abnormalities.   Electronically Signed   By: Evangeline Dakin M.D.   On: 04/10/2015 09:24   Dg Chest Port 1 View  04/09/2015   CLINICAL DATA:  Aortic valve replacement.  EXAM: PORTABLE CHEST - 1 VIEW  COMPARISON:  04/08/2015.  FINDINGS: Right IJ sheath in stable position. Mediastinum hilar structures normal. Prior cardiac valve replacement. Cardiomegaly with normal pulmonary vascularity. Low lung volumes with dense bibasilar atelectasis. No pneumothorax. Surgical clips and staples over the right chest.  IMPRESSION: 1. Right IJ sheath in stable position. 2. Prior cardiac valve replacement. Stable cardiomegaly. No pulmonary venous congestion. 3. Low lung volumes with dense bibasilar atelectasis.   Electronically Signed   By: Marcello Moores  Register   On: 04/09/2015 07:58   Dg Chest Port 1 View  04/08/2015   CLINICAL DATA:  Status post aortic valve replacement and Maze procedure  EXAM: PORTABLE CHEST - 1 VIEW  COMPARISON:  Portable chest x-ray of April 07, 2015  FINDINGS: The lungs remain borderline hypoinflated. The retrocardiac region on the left remains dense and there is obscuration of the left hemidiaphragm. There is persistent increased density in the right infrahilar region. The inferior chest tube on the left is not visible. The right internal jugular Cordis sheath is unchanged in position. A catheter is present in the descending thoracic aorta and is stable.  IMPRESSION: Essentially stable appearance of the chest since yesterday's study. There is bibasilar atelectasis and small left pleural effusion.   Electronically Signed   By: David  Martinique M.D.   On: 04/08/2015 08:50   Dg Chest Port 1 View  04/07/2015   CLINICAL DATA:  54 year old male status post aortic valve replacement. Initial encounter.  EXAM: PORTABLE CHEST - 1 VIEW  COMPARISON:  04/06/2015  and earlier.  FINDINGS: Portable AP upright view at 0644 hours. Extubated. Enteric tube removed. Swan-Ganz catheter removed. Introducer sheath remains. Stable mediastinal drain. Stable right anterior chest wall skin staples.  Mildly lower lung volumes. Stable cardiomegaly and mediastinal contours. Increased bilateral perihilar opacity. No overt edema. No pneumothorax. Small left pleural effusion suspected.  IMPRESSION: 1. Extubated, enteric tube and Swan-Ganz catheter removed. 2. Lower lung volumes with increased atelectasis. Small left pleural effusion suspected.   Electronically Signed   By: Genevie Ann M.D.   On: 04/07/2015 07:49   Dg Chest Port 1 View  04/06/2015   CLINICAL DATA:  Aortic valve replacement and replacement ascending thoracic aorta.  EXAM: PORTABLE CHEST - 1 VIEW  COMPARISON:  04/05/2015  FINDINGS: Endotracheal tube, Swan-Ganz catheter, and chest tube appear in good position. No pneumothorax. Cardiomegaly. Pulmonary vascularity is normal.  Persistent atelectasis/ effusion at the left lung base. Slight atelectasis, effusion along the fissures on the right.  IMPRESSION: Slightly improved aeration bilaterally. Atelectasis and effusions as described.   Electronically Signed   By: Lorriane Shire M.D.   On: 04/06/2015 08:10   Dg Chest Portable 1 View  04/05/2015   CLINICAL DATA:  Postop aortic valve replacement.  EXAM: PORTABLE CHEST - 1 VIEW  COMPARISON:  04/04/2015  FINDINGS: There has been median sternotomy and aortic valve replacement. Endotracheal tube has its tip 4 cm above the carina. Right internal jugular swelling its catheter has its tip in the right main pulmonary artery. Mediastinal drains in place. No pneumothorax. Moderate volume loss in the left lower lobe.  IMPRESSION: No pneumothorax. Moderate left lower lobe volume loss. Lines and tubes  grossly well positioned.   Electronically Signed   By: Nelson Chimes M.D.   On: 04/05/2015 17:11   Dg Swallowing Func-speech Pathology  04/16/2015     Objective Swallowing Evaluation:    Patient Details  Name: [name] MRN: [id number] Date of Birth: 1961-01-02  Today's Date: 04/16/2015 Time: SLP Start Time (ACUTE ONLY): 1000-SLP Stop Time (ACUTE ONLY): 1018 SLP Time Calculation (min) (ACUTE ONLY): 18 min  Past Medical History:  Past Medical History  Diagnosis Date  . Insomnia   . CHF (congestive heart failure)   . Congenital insufficiency of aortic valve   . Congestive cardiomyopathy   . Atrial flutter   . Benign hypertensive heart and renal disease   . Essential hypertension, malignant   . SOB (shortness of breath)   . Murmur   . Aortic stenosis 11/19/2014  . 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 Benign essential HTN 11/19/2014  . Chronic systolic CHF (congestive heart failure) 11/19/2014   Past Surgical History:  Past Surgical History  Procedure Laterality Date  . Left and right heart catheterization with coronary angiogram N/A  11/27/2014    Procedure: LEFT AND RIGHT HEART CATHETERIZATION WITH CORONARY ANGIOGRAM;   Surgeon: [name], MD;  Location: [facility] CATH LAB;  Service:  Cardiovascular;  Laterality: N/A;  . Aortic valve replacement N/A 04/05/2015    Procedure: AORTIC VALVE REPLACEMENT (AVR) using a 29mm Edwards Aortic  Magna Ease Valve ;  Surgeon: [name], MD;  Location: [place];   Service: Open Heart Surgery;  Laterality: N/A;  . Tee without cardioversion N/A 04/05/2015    Procedure: TRANSESOPHAGEAL ECHOCARDIOGRAM (TEE);  Surgeon: [name], MD;  Location: [place];  Service: Open Heart Surgery;  Laterality:  N/A;  . Maze N/A 04/05/2015    Procedure: MAZE;  Surgeon: [name], MD;  Location: [place];   Service: Open Heart Surgery;  Laterality: N/A;  . Replacement ascending aorta N/A 04/05/2015    Procedure: REPLACEMENT ASCENDING AORTA with a 71mm Hemashield Platinum  Graft;  Surgeon: [name], MD;  Location: [place];  Service: Open  Heart Surgery;  Laterality: N/A;   HPI:  HPI:  54 y.o. man underwent aortic valve replacment  4/18.  Pt was  intubated for surgery and extubated within 24 hours,on 4/19.  PMH:   obesity, HTN, sliding Hiatal Hernia with reflux.  Clinical swallow  evaluation on 4/20 revealed dysphagia with concerns for aspiration.   Orders for FEES 04/09/15.   No Data Recorded  Assessment / Plan / Recommendation CHL IP CLINICAL IMPRESSIONS 04/16/2015  Dysphagia Diagnosis Mild pharyngeal phase dysphagia  Clinical impression Pt demosntrates significant improvement in swallow  function with only mild orpharyngeal residuals observed post swallow due  to decreased base of tongue retraction. Timing WNL with no penetration or  aspiration observed. Pt may resume a regular diet with thin liquids with a  second swallow to clear residual. SLP to f/u for further training and  education.       CHL IP TREATMENT RECOMMENDATION 04/16/2015  Treatment Plan Recommendations Therapy as outlined in treatment plan below      [facility] IP DIET RECOMMENDATION 04/16/2015  Diet Recommendations Regular;Thin liquid  Liquid Administration via Cup;Straw  Medication Administration Whole meds with liquid  Compensations Slow rate;Multiple dry swallows after each bite/sip  Postural Changes and/or Swallow Maneuvers Seated upright 90 degrees     [facility] IP OTHER RECOMMENDATIONS 04/16/2015  Recommended Consults (None)  Oral Care Recommendations Patient independent  with oral care  Other Recommendations (None)     [facility] IP FOLLOW UP RECOMMENDATIONS 04/16/2015  Follow up Recommendations Inpatient Rehab;Skilled Nursing facility     [facility] IP FREQUENCY AND DURATION 04/16/2015  Speech Therapy Frequency (ACUTE ONLY) min 2x/week  Treatment Duration 2 weeks     Pertinent Vitals/Pain NA    SLP Swallow Goals No flowsheet data found.  No flowsheet data found.    [facility] IP REASON FOR REFERRAL 04/12/2015  Reason for Referral Objectively evaluate swallowing function     [facility] IP ORAL PHASE 04/16/2015  Lips (None)  Tongue (None)  Mucous membranes (None)  Nutritional status (None)  Other (None)  Oxygen  therapy (None)  Oral Phase WFL  Oral - Pudding Teaspoon (None)  Oral - Pudding Cup (None)  Oral - Honey Teaspoon (None)  Oral - Honey Cup (None)  Oral - Honey Syringe (None)  Oral - Nectar Teaspoon (None)  Oral - Nectar Cup (None)  Oral - Nectar Straw (None)  Oral - Nectar Syringe (None)  Oral - Ice Chips (None)  Oral - Thin Teaspoon (None)  Oral - Thin Cup (None)  Oral - Thin Straw (None)  Oral - Thin Syringe (None)  Oral - Puree (None)  Oral - Mechanical Soft (None)  Oral - Regular (None)  Oral - Multi-consistency (None)  Oral - Pill (None)  Oral Phase - Comment (None)      [facility] IP PHARYNGEAL PHASE 04/16/2015  Pharyngeal Phase Impaired  Pharyngeal - Pudding Teaspoon (None)  Penetration/Aspiration details (pudding teaspoon) (None)  Pharyngeal - Pudding Cup (None)  Penetration/Aspiration details (pudding cup) (None)  Pharyngeal - Honey Teaspoon NT  Penetration/Aspiration details (honey teaspoon) (None)  Pharyngeal - Honey Cup NT  Penetration/Aspiration details (honey cup) (None)  Pharyngeal - Honey Syringe (None)  Penetration/Aspiration details (honey syringe) (None)  Pharyngeal - Nectar Teaspoon NT  Penetration/Aspiration details (nectar teaspoon) (None)  Pharyngeal - Nectar Cup Reduced tongue base retraction;Pharyngeal residue  - valleculae  Penetration/Aspiration details (nectar cup) (None)  Pharyngeal - Nectar Straw (None)  Penetration/Aspiration details (nectar straw) (None)  Pharyngeal - Nectar Syringe (None)  Penetration/Aspiration details (nectar syringe) (None)  Pharyngeal - Ice Chips NT  Penetration/Aspiration details (ice chips) (None)  Pharyngeal - Thin Teaspoon NT  Penetration/Aspiration details (thin teaspoon) (None)  Pharyngeal - Thin Cup Reduced tongue base retraction;Pharyngeal residue -  valleculae  Penetration/Aspiration details (thin cup) (None)  Pharyngeal - Thin Straw Reduced tongue base retraction;Pharyngeal residue  - valleculae  Penetration/Aspiration details (thin straw) (None)  Pharyngeal -  Thin Syringe (None)  Penetration/Aspiration details (thin syringe') (None)  Pharyngeal - Puree WFL  Penetration/Aspiration details (puree) (None)  Pharyngeal - Mechanical Soft NT  Penetration/Aspiration details (mechanical soft) (None)  Pharyngeal - Regular WFL  Penetration/Aspiration details (regular) (None)  Pharyngeal - Multi-consistency (None)  Penetration/Aspiration details (multi-consistency) (None)  Pharyngeal - Pill Reduced tongue base retraction;Pharyngeal residue -  valleculae  Penetration/Aspiration details (pill) (None)  Pharyngeal Comment (None)     [facility] IP CERVICAL ESOPHAGEAL PHASE 04/16/2015  Cervical Esophageal Phase WFL  Pudding Teaspoon (None)  Pudding Cup (None)  Honey Teaspoon (None)  Honey Cup (None)  Honey Syringe (None)  Nectar Teaspoon (None)  Nectar Cup (None)  Nectar Straw (None)  Nectar Syringe (None)  Thin Teaspoon (None)  Thin Cup (None)  Thin Straw (None)  Thin Syringe (None)  Cervical Esophageal Comment (None)    No flowsheet data found.        Herbie [place], [place] CCC-SLP [phone]  De[place], Katherene Ponto  04/16/2015, 11:22 AM      Assessment/Plan   ICD-9-CM ICD-10-CM   1. Acute bronchitis, unspecified organism 466.0 J20.9    r/o pneumonia   2. S/P AVR (aortic valve replacement) and aortoplasty V43.3 Z95.4   3. Chronic systolic CHF (congestive heart failure) 428.22 I50.22    428.0    4. Essential hypertension - stable 401.9 I10   5. Atrial flutter, unspecified s/p MAZE procedure - rate controlled 427.32 I48.92   6. CKD (chronic kidney disease), stage II-III - stable 585.2 N18.2   7. Weakness generalized - due to #1-3,5 780.79 R53.1   8. Congestive dilated cardiomyopathy - stable 425.4 I42.0     --start levaquin 750mg  po daily x 10 with floraster BID x 2 weeks. Will treat empirically due to clinical exam and recent hospitalization for valve sx  --start duonebs q6 hrs ATC   --cont other meds as ordered  --STAT CXR to r/o pneumonia. Keep scheduled CXR for 5/25th  also  --PT as ordered  --f/u with specialists as scheduled  --GOAL: short term rehab and d/c home when medically appropriate. Communicated with pt and nursing.  --will follow  Airianna Kreischer S. Perlie Gold  [facility] [address] [place], [place] [id number] [phone] Cell (Monday-Friday 8 AM - 5 PM) [phone] After 5 PM and follow prompts

## 2015-04-30 ENCOUNTER — Encounter: Payer: Self-pay | Admitting: Nurse Practitioner

## 2015-05-12 ENCOUNTER — Encounter: Payer: Self-pay | Admitting: Cardiothoracic Surgery

## 2015-05-13 ENCOUNTER — Other Ambulatory Visit: Payer: Self-pay | Admitting: Cardiothoracic Surgery

## 2015-05-13 DIAGNOSIS — Z952 Presence of prosthetic heart valve: Secondary | ICD-10-CM

## 2015-05-19 ENCOUNTER — Telehealth: Payer: Self-pay | Admitting: *Deleted

## 2015-06-01 ENCOUNTER — Telehealth: Payer: Self-pay | Admitting: *Deleted

## 2015-06-10 ENCOUNTER — Telehealth: Payer: Self-pay | Admitting: *Deleted

## 2015-07-14 ENCOUNTER — Encounter: Payer: Medicaid Other | Admitting: Cardiothoracic Surgery

## 2015-07-14 ENCOUNTER — Encounter: Payer: Self-pay | Admitting: Cardiothoracic Surgery

## 2015-07-16 ENCOUNTER — Telehealth: Payer: Self-pay | Admitting: *Deleted

## 2016-04-15 IMAGING — CR DG CHEST 1V PORT
1 series · 1 of 1 positions shown · non-contrast
Comparison: 04/05/2015

CLINICAL DATA: Aortic valve replacement and replacement ascending
thoracic aorta.

EXAM:
PORTABLE CHEST - 1 VIEW

[AP]
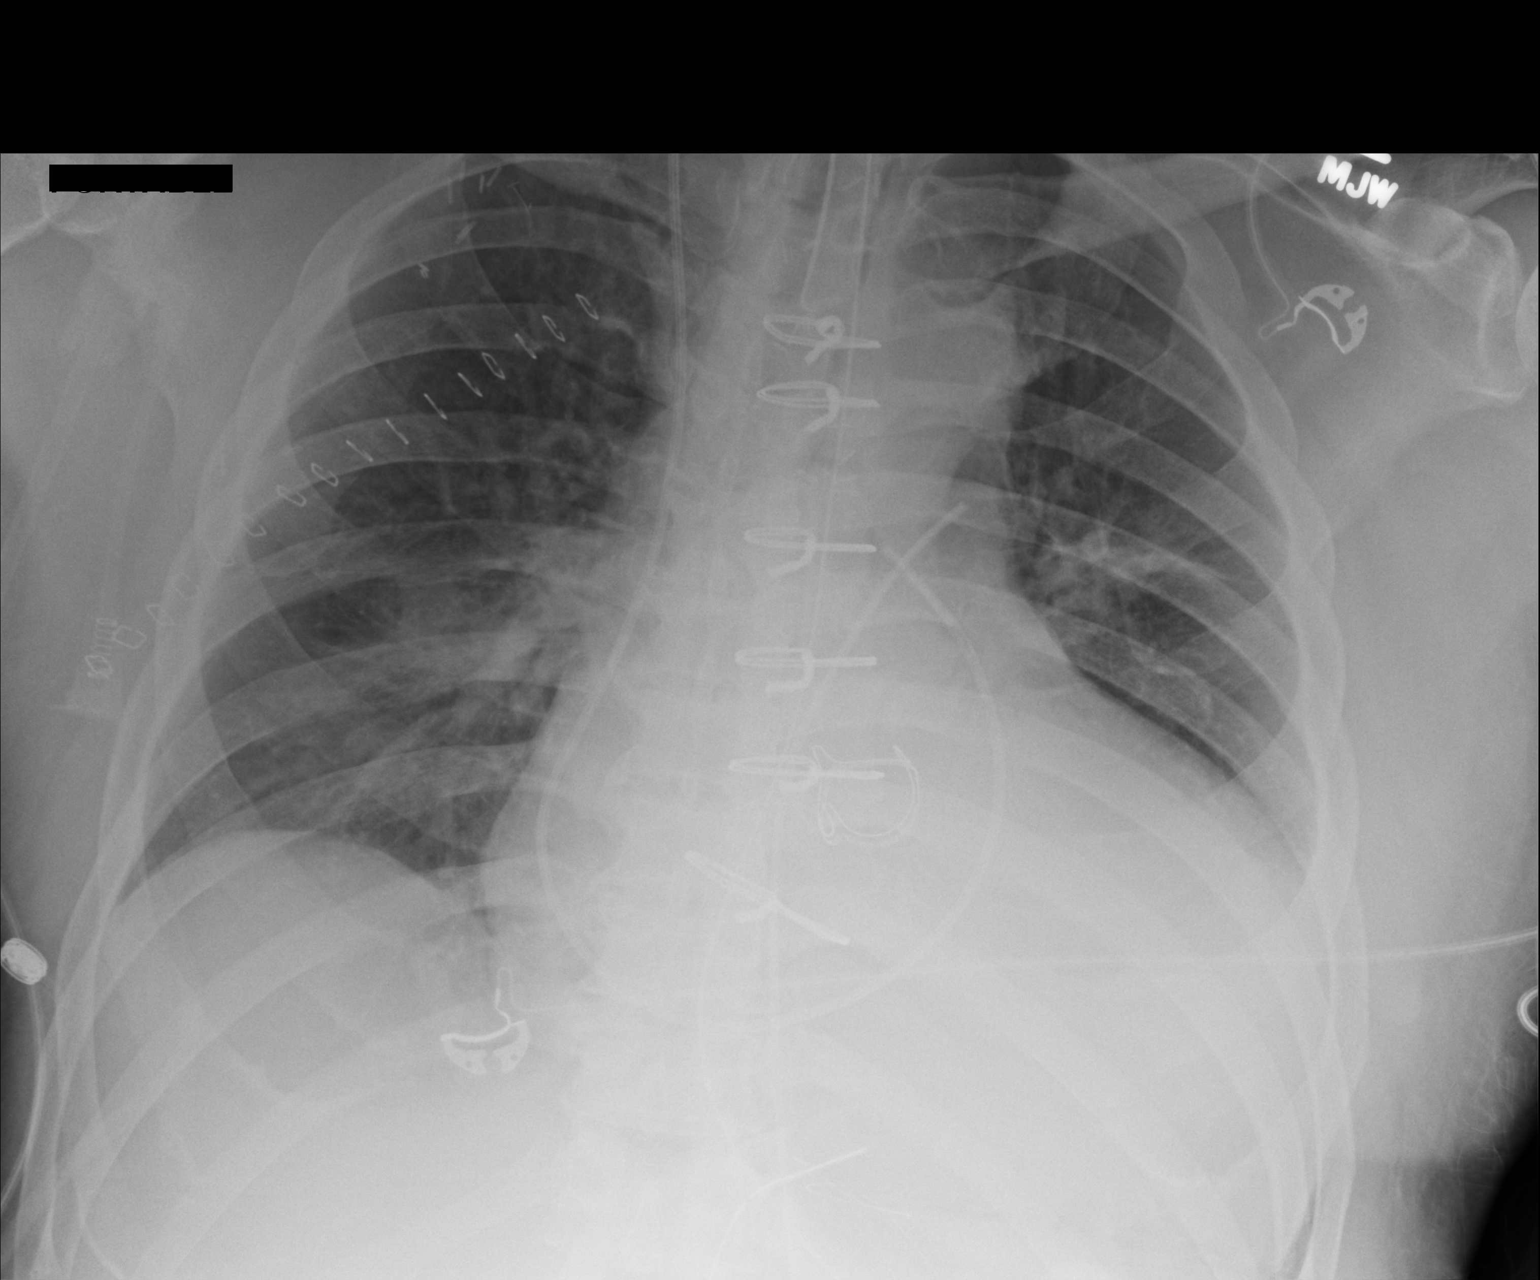

[1 of 1 positions shown; findings below may reference images not displayed]

FINDINGS: Endotracheal tube, Swan-Ganz catheter, and chest tube appear in good
position. No pneumothorax. Cardiomegaly. Pulmonary vascularity is
normal.

Persistent atelectasis/ effusion at the left lung base. Slight
atelectasis, effusion along the fissures on the right.
IMPRESSION: Slightly improved aeration bilaterally. Atelectasis and effusions as
described.

## 2016-04-16 IMAGING — CR DG CHEST 1V PORT
1 series · 1 of 1 positions shown · non-contrast
Comparison: 04/06/2015 and earlier.

CLINICAL DATA: 54-year-old male status post aortic valve
replacement. Initial encounter.

EXAM:
PORTABLE CHEST - 1 VIEW

[AP]
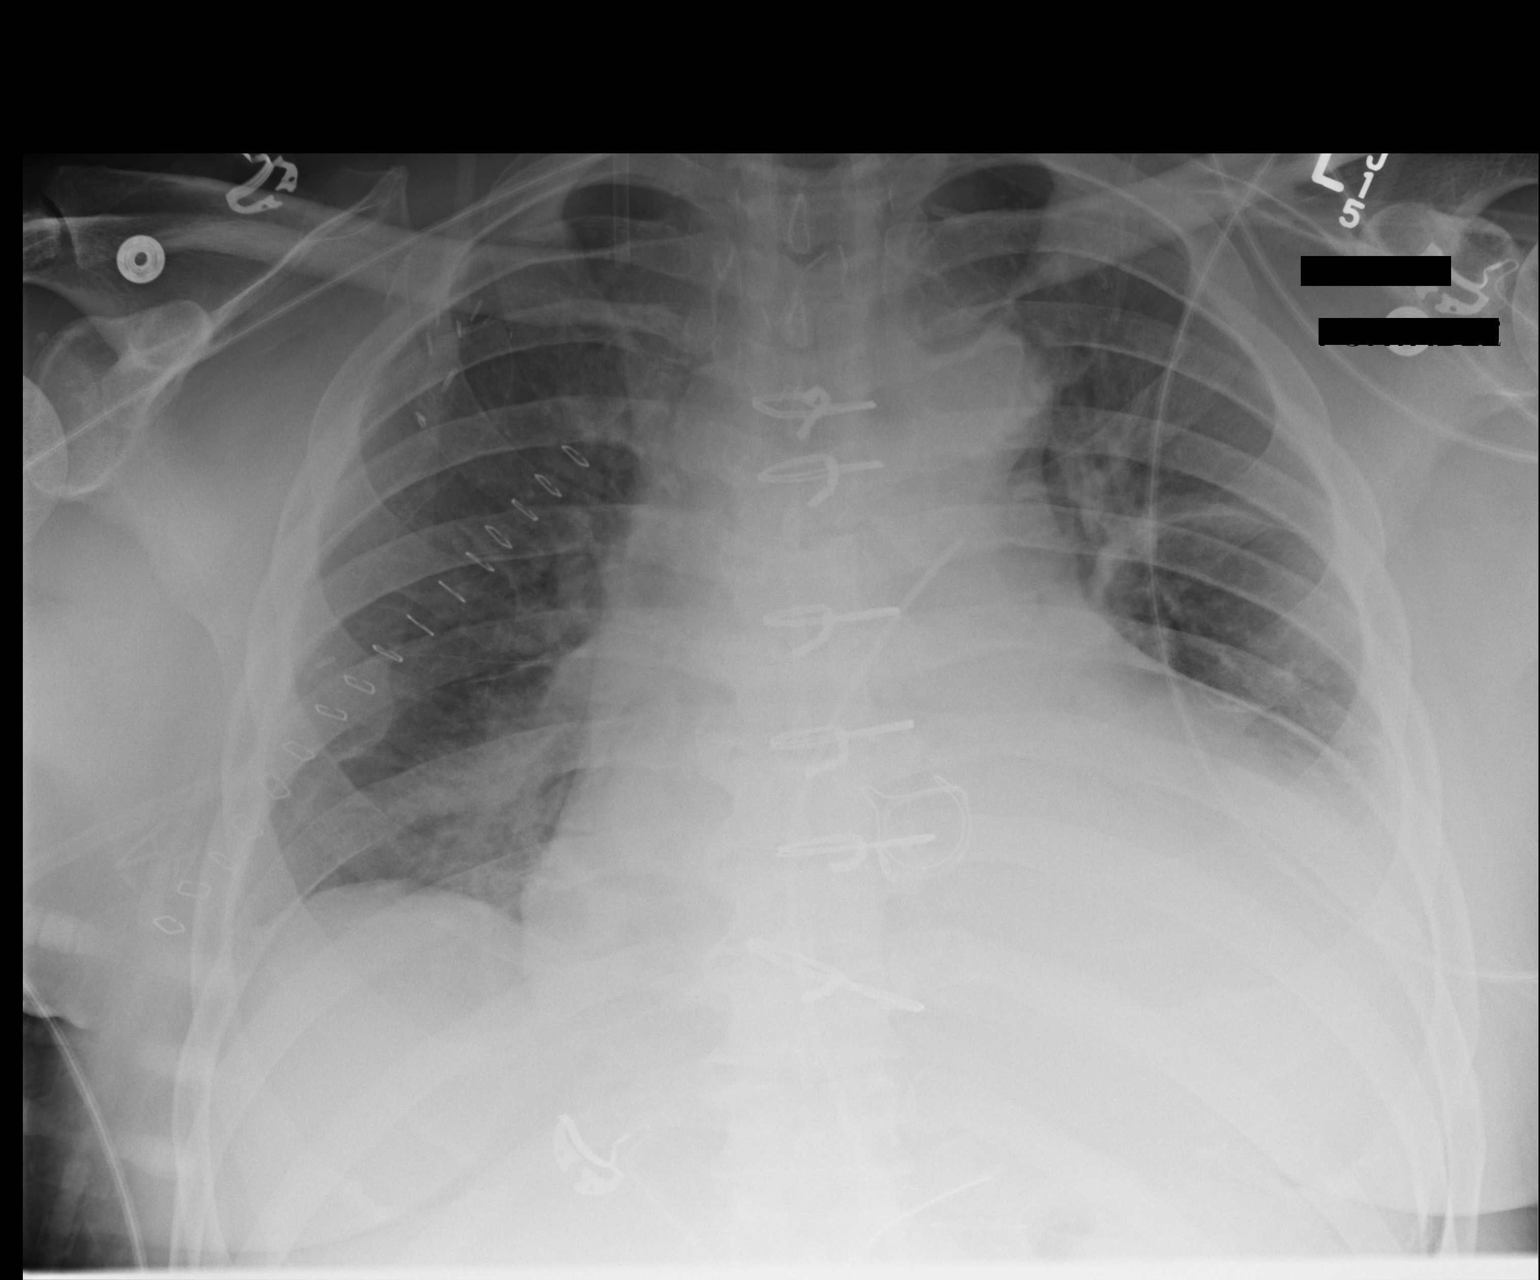

[1 of 1 positions shown; findings below may reference images not displayed]

FINDINGS: Portable AP upright view at 1955 hours. Extubated. Enteric tube
removed. Swan-Ganz catheter removed. Introducer sheath remains.
Stable mediastinal drain. Stable right anterior chest wall skin
staples.

Mildly lower lung volumes. Stable cardiomegaly and mediastinal
contours. Increased bilateral perihilar opacity. No overt edema. No
pneumothorax. Small left pleural effusion suspected.
IMPRESSION: 1. Extubated, enteric tube and Swan-Ganz catheter removed.
2. Lower lung volumes with increased atelectasis. Small left pleural
effusion suspected.

## 2016-04-17 IMAGING — CR DG CHEST 1V PORT
1 series · 1 of 1 positions shown · non-contrast
Comparison: Portable chest x-ray April 07, 2015

CLINICAL DATA: Status post aortic valve replacement and Maze
procedure

EXAM:
PORTABLE CHEST - 1 VIEW

[AP]
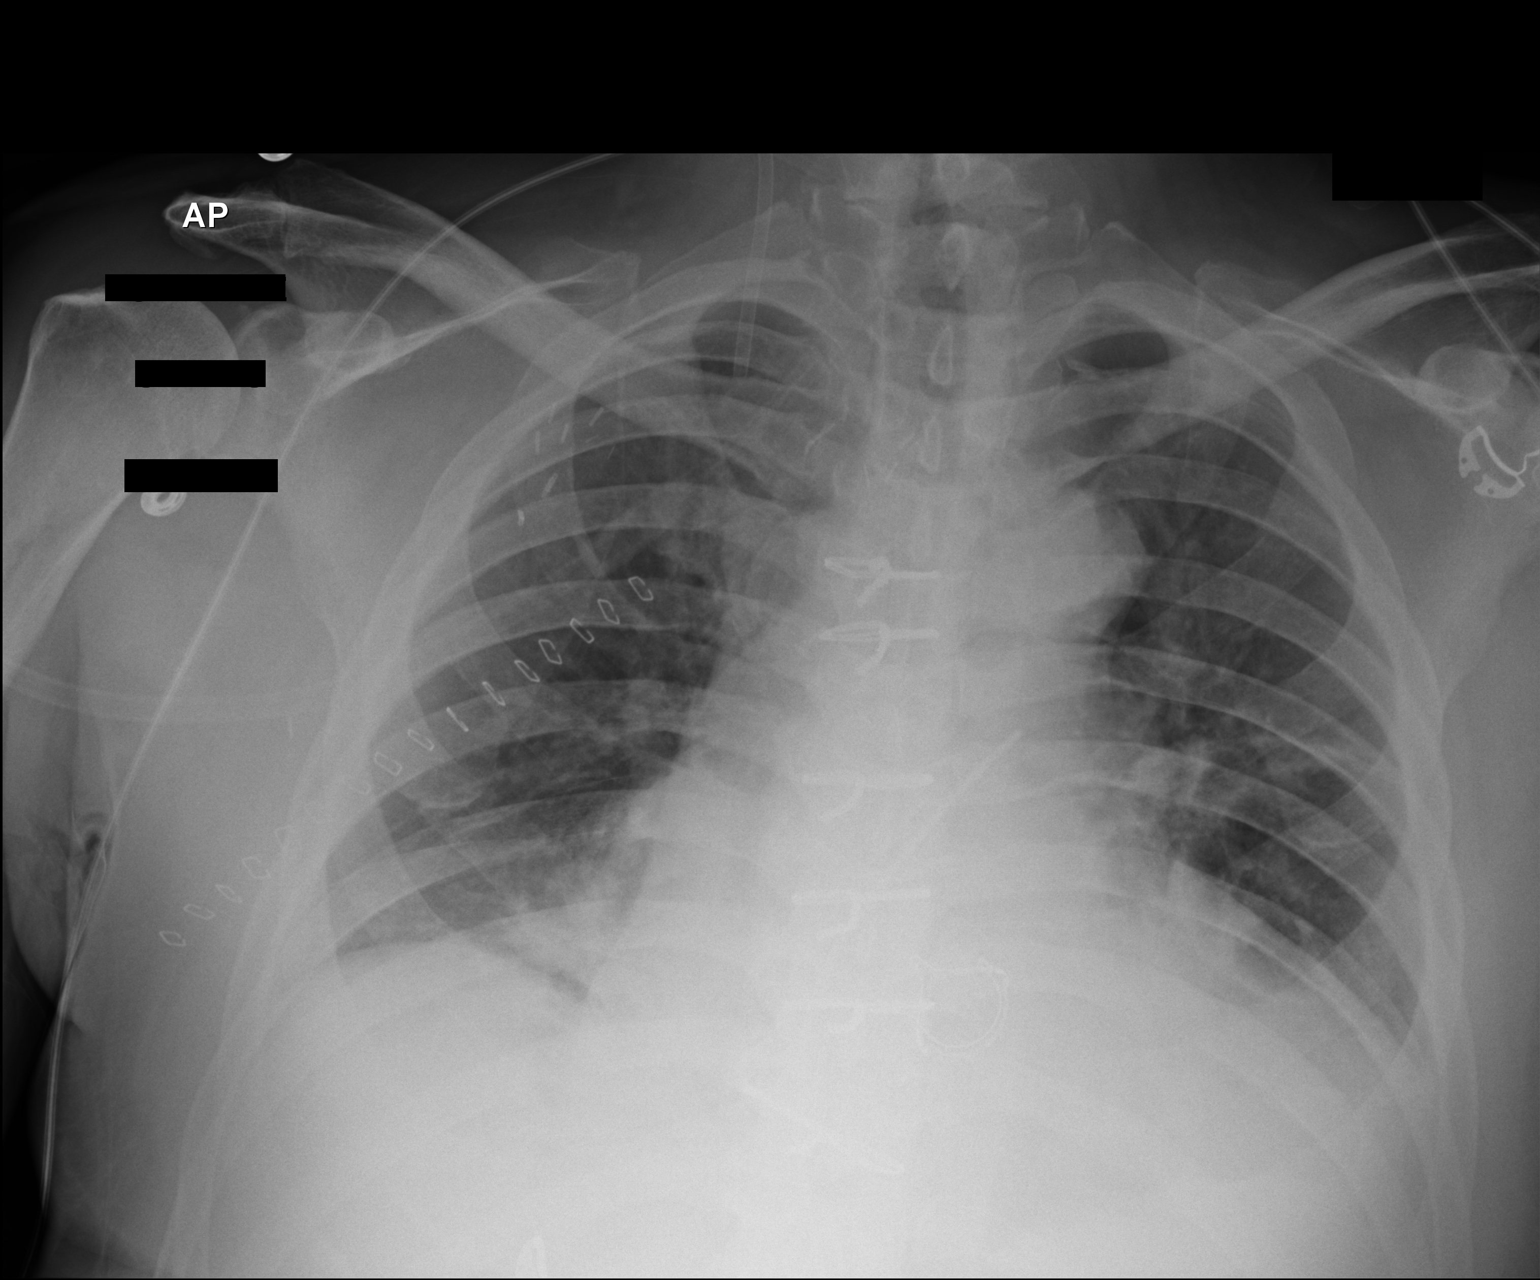

[1 of 1 positions shown; findings below may reference images not displayed]

FINDINGS: The lungs remain borderline hypoinflated. The retrocardiac region on
the left remains dense and there is obscuration of the left
hemidiaphragm. There is persistent increased density in the right
infrahilar region. The inferior chest tube on the left is not
visible. The right internal jugular Cordis sheath is unchanged in
position. A catheter is present in the descending thoracic aorta and
is stable.
IMPRESSION: Essentially stable appearance of the chest since yesterday's study.
There is bibasilar atelectasis and small left pleural effusion.

## 2016-04-18 IMAGING — CR DG CHEST 1V PORT
1 series · 1 of 1 positions shown · non-contrast
Comparison: 04/08/2015.

CLINICAL DATA: Aortic valve replacement.

EXAM:
PORTABLE CHEST - 1 VIEW

[AP]
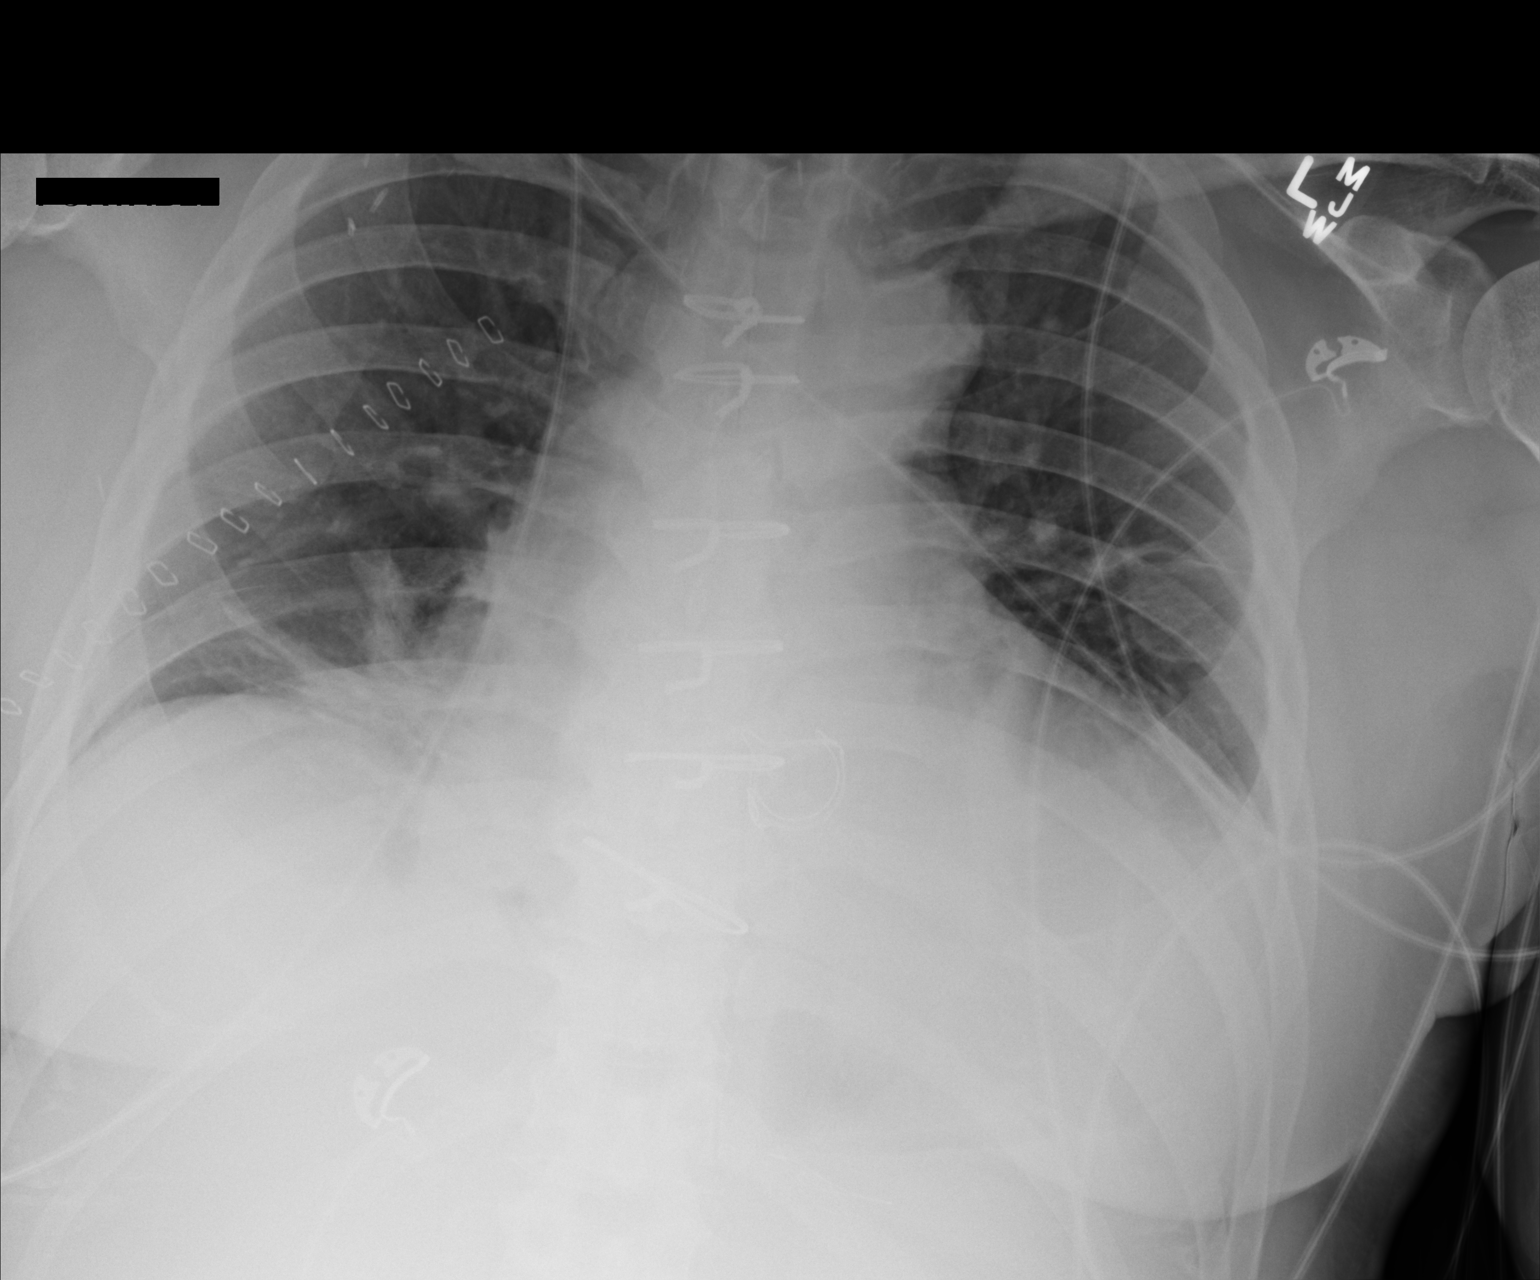

[1 of 1 positions shown; findings below may reference images not displayed]

FINDINGS: Right IJ sheath in stable position. Mediastinum hilar structures
normal. Prior cardiac valve replacement. Cardiomegaly with normal
pulmonary vascularity. Low lung volumes with dense bibasilar
atelectasis. No pneumothorax. Surgical clips and staples over the
right chest.
IMPRESSION: 1. Right IJ sheath in stable position.
2. Prior cardiac valve replacement. Stable cardiomegaly. No
pulmonary venous congestion.
3. Low lung volumes with dense bibasilar atelectasis.

## 2016-04-19 IMAGING — CR DG CHEST 1V PORT
1 series · 1 of 1 positions shown · non-contrast
Comparison: Portable chest x-rays yesterday and earlier.

CLINICAL DATA: Five days postop aortic valve replacement for aortic
stenosis. Followup basilar atelectasis.

EXAM:
PORTABLE CHEST - 1 VIEW

[AP]
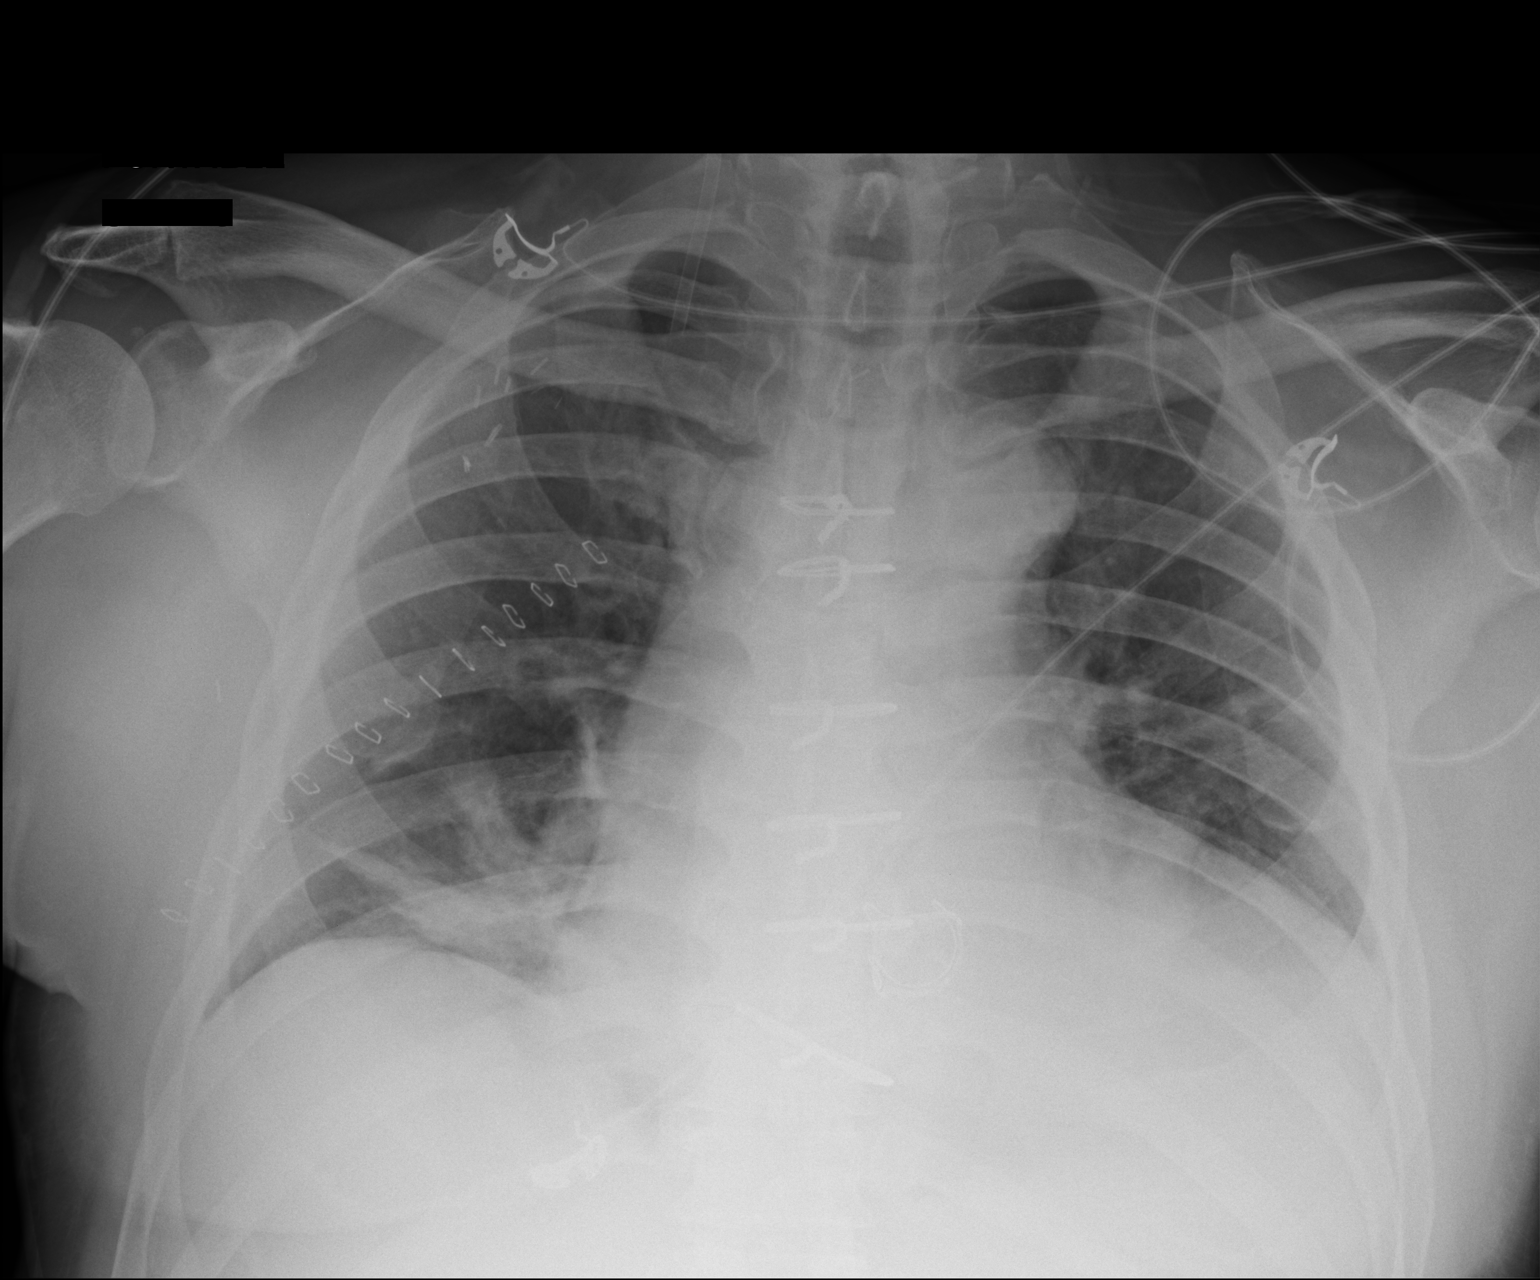

[1 of 1 positions shown; findings below may reference images not displayed]

FINDINGS: Sternotomy for aortic valve replacement. Cardiac silhouette markedly
enlarged, unchanged. Pulmonary vascularity normal. Dense atelectasis
in the left lower lobe with possible left pleural effusion,
unchanged. Moderate linear atelectasis at the right lung base,
unchanged. No new pulmonary parenchymal abnormalities. Right jugular
introducer sheath tip projects at the junction of the jugular vein
and innominate vein, unchanged.
IMPRESSION: 1. Stable dense left lower lobe atelectasis and possible left
pleural effusion.
2. Stable moderate right basilar atelectasis.
3. No new abnormalities.

## 2016-07-11 NOTE — Progress Notes (Signed)
This encounter was created in error - please disregard.

## 2018-11-29 ENCOUNTER — Inpatient Hospital Stay (HOSPITAL_COMMUNITY): Payer: Medicare Other

## 2018-11-29 ENCOUNTER — Encounter (HOSPITAL_COMMUNITY): Payer: Self-pay

## 2018-11-29 ENCOUNTER — Emergency Department (HOSPITAL_COMMUNITY): Payer: Medicare Other

## 2018-11-29 ENCOUNTER — Other Ambulatory Visit: Payer: Self-pay

## 2018-11-29 ENCOUNTER — Inpatient Hospital Stay (HOSPITAL_COMMUNITY)
Admission: EM | Admit: 2018-11-29 | Discharge: 2018-12-10 | DRG: 207 | Disposition: A | Discharge status: Home / Self Care | Payer: Medicare Other | Attending: Internal Medicine | Admitting: Internal Medicine

## 2018-11-29 DIAGNOSIS — T884XXA Failed or difficult intubation, initial encounter: Secondary | ICD-10-CM

## 2018-11-29 DIAGNOSIS — Z952 Presence of prosthetic heart valve: Secondary | ICD-10-CM | POA: Diagnosis not present

## 2018-11-29 DIAGNOSIS — R451 Restlessness and agitation: Secondary | ICD-10-CM | POA: Diagnosis present

## 2018-11-29 DIAGNOSIS — K5900 Constipation, unspecified: Secondary | ICD-10-CM | POA: Diagnosis present

## 2018-11-29 DIAGNOSIS — Z0189 Encounter for other specified special examinations: Secondary | ICD-10-CM

## 2018-11-29 DIAGNOSIS — I5033 Acute on chronic diastolic (congestive) heart failure: Secondary | ICD-10-CM | POA: Diagnosis not present

## 2018-11-29 DIAGNOSIS — N183 Chronic kidney disease, stage 3 (moderate): Secondary | ICD-10-CM | POA: Diagnosis present

## 2018-11-29 DIAGNOSIS — R6521 Severe sepsis with septic shock: Secondary | ICD-10-CM | POA: Diagnosis not present

## 2018-11-29 DIAGNOSIS — I5041 Acute combined systolic (congestive) and diastolic (congestive) heart failure: Secondary | ICD-10-CM | POA: Diagnosis not present

## 2018-11-29 DIAGNOSIS — M25721 Osteophyte, right elbow: Secondary | ICD-10-CM | POA: Diagnosis present

## 2018-11-29 DIAGNOSIS — E876 Hypokalemia: Secondary | ICD-10-CM | POA: Diagnosis present

## 2018-11-29 DIAGNOSIS — J15211 Pneumonia due to Methicillin susceptible Staphylococcus aureus: Secondary | ICD-10-CM | POA: Diagnosis present

## 2018-11-29 DIAGNOSIS — R570 Cardiogenic shock: Secondary | ICD-10-CM | POA: Diagnosis not present

## 2018-11-29 DIAGNOSIS — R001 Bradycardia, unspecified: Secondary | ICD-10-CM | POA: Diagnosis not present

## 2018-11-29 DIAGNOSIS — I420 Dilated cardiomyopathy: Secondary | ICD-10-CM | POA: Diagnosis present

## 2018-11-29 DIAGNOSIS — R0902 Hypoxemia: Secondary | ICD-10-CM | POA: Diagnosis not present

## 2018-11-29 DIAGNOSIS — Z95828 Presence of other vascular implants and grafts: Secondary | ICD-10-CM

## 2018-11-29 DIAGNOSIS — R7989 Other specified abnormal findings of blood chemistry: Secondary | ICD-10-CM

## 2018-11-29 DIAGNOSIS — I2729 Other secondary pulmonary hypertension: Secondary | ICD-10-CM | POA: Diagnosis present

## 2018-11-29 DIAGNOSIS — Z6837 Body mass index (BMI) 37.0-37.9, adult: Secondary | ICD-10-CM

## 2018-11-29 DIAGNOSIS — Z7401 Bed confinement status: Secondary | ICD-10-CM

## 2018-11-29 DIAGNOSIS — M7989 Other specified soft tissue disorders: Secondary | ICD-10-CM | POA: Diagnosis not present

## 2018-11-29 DIAGNOSIS — R52 Pain, unspecified: Secondary | ICD-10-CM

## 2018-11-29 DIAGNOSIS — E1122 Type 2 diabetes mellitus with diabetic chronic kidney disease: Secondary | ICD-10-CM | POA: Diagnosis present

## 2018-11-29 DIAGNOSIS — I5023 Acute on chronic systolic (congestive) heart failure: Secondary | ICD-10-CM | POA: Diagnosis not present

## 2018-11-29 DIAGNOSIS — J9601 Acute respiratory failure with hypoxia: Secondary | ICD-10-CM | POA: Diagnosis not present

## 2018-11-29 DIAGNOSIS — R579 Shock, unspecified: Secondary | ICD-10-CM

## 2018-11-29 DIAGNOSIS — E785 Hyperlipidemia, unspecified: Secondary | ICD-10-CM | POA: Diagnosis present

## 2018-11-29 DIAGNOSIS — I130 Hypertensive heart and chronic kidney disease with heart failure and stage 1 through stage 4 chronic kidney disease, or unspecified chronic kidney disease: Secondary | ICD-10-CM | POA: Diagnosis present

## 2018-11-29 DIAGNOSIS — I370 Nonrheumatic pulmonary valve stenosis: Secondary | ICD-10-CM

## 2018-11-29 DIAGNOSIS — J9690 Respiratory failure, unspecified, unspecified whether with hypoxia or hypercapnia: Secondary | ICD-10-CM | POA: Diagnosis present

## 2018-11-29 DIAGNOSIS — J9602 Acute respiratory failure with hypercapnia: Secondary | ICD-10-CM | POA: Diagnosis not present

## 2018-11-29 DIAGNOSIS — G4700 Insomnia, unspecified: Secondary | ICD-10-CM | POA: Diagnosis present

## 2018-11-29 DIAGNOSIS — R0602 Shortness of breath: Secondary | ICD-10-CM | POA: Diagnosis not present

## 2018-11-29 DIAGNOSIS — R0603 Acute respiratory distress: Secondary | ICD-10-CM

## 2018-11-29 DIAGNOSIS — Z7982 Long term (current) use of aspirin: Secondary | ICD-10-CM

## 2018-11-29 DIAGNOSIS — E1165 Type 2 diabetes mellitus with hyperglycemia: Secondary | ICD-10-CM | POA: Diagnosis not present

## 2018-11-29 DIAGNOSIS — E872 Acidosis: Secondary | ICD-10-CM | POA: Diagnosis present

## 2018-11-29 DIAGNOSIS — J189 Pneumonia, unspecified organism: Secondary | ICD-10-CM | POA: Diagnosis not present

## 2018-11-29 DIAGNOSIS — I248 Other forms of acute ischemic heart disease: Secondary | ICD-10-CM | POA: Diagnosis present

## 2018-11-29 DIAGNOSIS — Z79899 Other long term (current) drug therapy: Secondary | ICD-10-CM

## 2018-11-29 DIAGNOSIS — Z452 Encounter for adjustment and management of vascular access device: Secondary | ICD-10-CM

## 2018-11-29 DIAGNOSIS — I5043 Acute on chronic combined systolic (congestive) and diastolic (congestive) heart failure: Secondary | ICD-10-CM | POA: Diagnosis present

## 2018-11-29 DIAGNOSIS — I480 Paroxysmal atrial fibrillation: Secondary | ICD-10-CM | POA: Diagnosis present

## 2018-11-29 DIAGNOSIS — N179 Acute kidney failure, unspecified: Secondary | ICD-10-CM | POA: Diagnosis present

## 2018-11-29 DIAGNOSIS — I10 Essential (primary) hypertension: Secondary | ICD-10-CM | POA: Diagnosis not present

## 2018-11-29 DIAGNOSIS — M79609 Pain in unspecified limb: Secondary | ICD-10-CM | POA: Diagnosis not present

## 2018-11-29 DIAGNOSIS — Z713 Dietary counseling and surveillance: Secondary | ICD-10-CM

## 2018-11-29 DIAGNOSIS — J9600 Acute respiratory failure, unspecified whether with hypoxia or hypercapnia: Secondary | ICD-10-CM | POA: Diagnosis not present

## 2018-11-29 LAB — I-STAT ARTERIAL BLOOD GAS, ED
Acid-base deficit: 5 mmol/L — ABNORMAL HIGH (ref 0.0–2.0)
Acid-base deficit: 5 mmol/L — ABNORMAL HIGH (ref 0.0–2.0)
Bicarbonate: 23.2 mmol/L (ref 20.0–28.0)
Bicarbonate: 26.3 mmol/L (ref 20.0–28.0)
O2 Saturation: 87 %
O2 Saturation: 85 %
Patient temperature: 98.6
Patient temperature: 98.6
TCO2: 25 mmol/L (ref 22–32)
TCO2: 29 mmol/L (ref 22–32)
pCO2 arterial: 51.2 mmHg — ABNORMAL HIGH (ref 32.0–48.0)
pCO2 arterial: 76.6 mmHg (ref 32.0–48.0)
pH, Arterial: 7.264 — ABNORMAL LOW (ref 7.350–7.450)
pH, Arterial: 7.143 — CL (ref 7.350–7.450)
pO2, Arterial: 61 mmHg — ABNORMAL LOW (ref 83.0–108.0)
pO2, Arterial: 67 mmHg — ABNORMAL LOW (ref 83.0–108.0)

## 2018-11-29 LAB — CBC WITH DIFFERENTIAL/PLATELET
Abs Immature Granulocytes: 0.14 10*3/uL — ABNORMAL HIGH (ref 0.00–0.07)
Basophils Absolute: 0.1 10*3/uL (ref 0.0–0.1)
Basophils Relative: 1 %
EOS PCT: 0 %
Eosinophils Absolute: 0.1 10*3/uL (ref 0.0–0.5)
HCT: 50.9 % (ref 39.0–52.0)
HEMOGLOBIN: 15.1 g/dL (ref 13.0–17.0)
Immature Granulocytes: 1 %
LYMPHS ABS: 3.5 10*3/uL (ref 0.7–4.0)
LYMPHS PCT: 18 %
MCH: 26.8 pg (ref 26.0–34.0)
MCHC: 29.7 g/dL — ABNORMAL LOW (ref 30.0–36.0)
MCV: 90.2 fL (ref 80.0–100.0)
MONOS PCT: 4 %
Monocytes Absolute: 0.9 10*3/uL (ref 0.1–1.0)
Neutro Abs: 15.1 10*3/uL — ABNORMAL HIGH (ref 1.7–7.7)
Neutrophils Relative %: 76 %
Platelets: 195 10*3/uL (ref 150–400)
RBC: 5.64 MIL/uL (ref 4.22–5.81)
RDW: 14.7 % (ref 11.5–15.5)
WBC: 19.7 10*3/uL — ABNORMAL HIGH (ref 4.0–10.5)
nRBC: 0 % (ref 0.0–0.2)

## 2018-11-29 LAB — COMPREHENSIVE METABOLIC PANEL
ALBUMIN: 4 g/dL (ref 3.5–5.0)
ALT: 25 U/L (ref 0–44)
AST: 44 U/L — AB (ref 15–41)
Alkaline Phosphatase: 99 U/L (ref 38–126)
Anion gap: 19 — ABNORMAL HIGH (ref 5–15)
BUN: 15 mg/dL (ref 6–20)
CO2: 19 mmol/L — ABNORMAL LOW (ref 22–32)
CREATININE: 1.86 mg/dL — AB (ref 0.61–1.24)
Calcium: 9.2 mg/dL (ref 8.9–10.3)
Chloride: 102 mmol/L (ref 98–111)
GFR calc Af Amer: 46 mL/min — ABNORMAL LOW (ref >60)
GFR calc non Af Amer: 39 mL/min — ABNORMAL LOW (ref >60)
GLUCOSE: 271 mg/dL — AB (ref 70–99)
Potassium: 4.2 mmol/L (ref 3.5–5.1)
Sodium: 140 mmol/L (ref 135–145)
Total Bilirubin: 1.5 mg/dL — ABNORMAL HIGH (ref 0.3–1.2)
Total Protein: 8.5 g/dL — ABNORMAL HIGH (ref 6.5–8.1)

## 2018-11-29 LAB — BRAIN NATRIURETIC PEPTIDE: B Natriuretic Peptide: 1232.9 pg/mL — ABNORMAL HIGH (ref 0.0–100.0)

## 2018-11-29 LAB — BETA-HYDROXYBUTYRIC ACID: Beta-Hydroxybutyric Acid: 0.94 mmol/L — ABNORMAL HIGH (ref 0.05–0.27)

## 2018-11-29 LAB — SURGICAL PCR SCREEN
MRSA, PCR: NEGATIVE
Staphylococcus aureus: POSITIVE — AB

## 2018-11-29 LAB — ECHOCARDIOGRAM COMPLETE
HEIGHTINCHES: 72 in
Weight: 4080 oz

## 2018-11-29 LAB — POCT I-STAT 3, ART BLOOD GAS (G3+)
Acid-base deficit: 3 mmol/L — ABNORMAL HIGH (ref 0.0–2.0)
Bicarbonate: 23 mmol/L (ref 20.0–28.0)
O2 SAT: 95 %
Patient temperature: 99.1
TCO2: 24 mmol/L (ref 22–32)
pCO2 arterial: 44.8 mmHg (ref 32.0–48.0)
pH, Arterial: 7.321 — ABNORMAL LOW (ref 7.350–7.450)
pO2, Arterial: 85 mmHg (ref 83.0–108.0)

## 2018-11-29 LAB — LACTIC ACID, PLASMA: LACTIC ACID, VENOUS: 1.6 mmol/L (ref 0.5–1.9)

## 2018-11-29 LAB — GLUCOSE, CAPILLARY
GLUCOSE-CAPILLARY: 118 mg/dL — AB (ref 70–99)
Glucose-Capillary: 99 mg/dL (ref 70–99)
Glucose-Capillary: 134 mg/dL — ABNORMAL HIGH (ref 70–99)
Glucose-Capillary: 122 mg/dL — ABNORMAL HIGH (ref 70–99)

## 2018-11-29 LAB — TRIGLYCERIDES: Triglycerides: 122 mg/dL (ref <150)

## 2018-11-29 LAB — INFLUENZA PANEL BY PCR (TYPE A & B)
Influenza A By PCR: NEGATIVE
Influenza B By PCR: NEGATIVE

## 2018-11-29 LAB — PHOSPHORUS: Phosphorus: 5.4 mg/dL — ABNORMAL HIGH (ref 2.5–4.6)

## 2018-11-29 LAB — MAGNESIUM: Magnesium: 2.1 mg/dL (ref 1.7–2.4)

## 2018-11-29 LAB — TROPONIN I
Troponin I: 0.15 ng/mL (ref <0.03)
Troponin I: 0.25 ng/mL (ref <0.03)
Troponin I: 0.24 ng/mL (ref <0.03)

## 2018-11-29 LAB — PROCALCITONIN: Procalcitonin: 3.11 ng/mL

## 2018-11-29 LAB — STREP PNEUMONIAE URINARY ANTIGEN: Strep Pneumo Urinary Antigen: NEGATIVE

## 2018-11-29 MED ORDER — SODIUM CHLORIDE 0.9 % IV SOLN
1.0000 g | Freq: Once | INTRAVENOUS | Status: AC
  Administered 2018-11-29: 1 g via INTRAVENOUS
  Filled 2018-11-29: qty 10

## 2018-11-29 MED ORDER — NITROGLYCERIN 2 % TD OINT
1.0000 [in_us] | TOPICAL_OINTMENT | Freq: Once | TRANSDERMAL | Status: AC
  Administered 2018-11-29: 1 [in_us] via TOPICAL
  Filled 2018-11-29: qty 1

## 2018-11-29 MED ORDER — CHLORHEXIDINE GLUCONATE CLOTH 2 % EX PADS
6.0000 | MEDICATED_PAD | Freq: Every day | CUTANEOUS | Status: DC
  Administered 2018-11-29: 6 via TOPICAL

## 2018-11-29 MED ORDER — PROPOFOL 1000 MG/100ML IV EMUL
INTRAVENOUS | Status: AC
  Filled 2018-11-29: qty 100

## 2018-11-29 MED ORDER — ALBUTEROL (5 MG/ML) CONTINUOUS INHALATION SOLN
10.0000 mg/h | INHALATION_SOLUTION | Freq: Once | RESPIRATORY_TRACT | Status: AC
  Administered 2018-11-29: 10 mg/h via RESPIRATORY_TRACT
  Filled 2018-11-29: qty 20

## 2018-11-29 MED ORDER — MAGNESIUM SULFATE 2 GM/50ML IV SOLN
2.0000 g | INTRAVENOUS | Status: AC
  Administered 2018-11-29: 2 g via INTRAVENOUS
  Filled 2018-11-29: qty 50

## 2018-11-29 MED ORDER — ASPIRIN EC 325 MG PO TBEC: 325.0000 mg | DELAYED_RELEASE_TABLET | Freq: Every day | ORAL | Status: DC

## 2018-11-29 MED ORDER — FENTANYL CITRATE (PF) 100 MCG/2ML IJ SOLN: 100.0000 ug | INTRAMUSCULAR | Status: DC | PRN

## 2018-11-29 MED ORDER — FENTANYL BOLUS VIA INFUSION
50.0000 ug | INTRAVENOUS | Status: DC | PRN
  Administered 2018-11-29: 100 ug via INTRAVENOUS
  Administered 2018-11-29: 50 ug via INTRAVENOUS
  Filled 2018-11-29: qty 50

## 2018-11-29 MED ORDER — SODIUM CHLORIDE 0.9 % IV SOLN
500.0000 mg | Freq: Once | INTRAVENOUS | Status: AC
  Administered 2018-11-29: 500 mg via INTRAVENOUS
  Filled 2018-11-29: qty 500

## 2018-11-29 MED ORDER — MUPIROCIN 2 % EX OINT
1.0000 "application " | TOPICAL_OINTMENT | Freq: Two times a day (BID) | CUTANEOUS | Status: AC
  Administered 2018-11-29 – 2018-12-04 (×10): 1 via NASAL
  Filled 2018-11-29 (×2): qty 22

## 2018-11-29 MED ORDER — ETOMIDATE 2 MG/ML IV SOLN
INTRAVENOUS | Status: AC | PRN
  Administered 2018-11-29: 20 mg via INTRAVENOUS

## 2018-11-29 MED ORDER — FENTANYL CITRATE (PF) 100 MCG/2ML IJ SOLN
INTRAMUSCULAR | Status: AC
  Filled 2018-11-29: qty 2

## 2018-11-29 MED ORDER — CHLORHEXIDINE GLUCONATE 0.12% ORAL RINSE (MEDLINE KIT)
15.0000 mL | Freq: Two times a day (BID) | OROMUCOSAL | Status: DC
  Administered 2018-11-29 – 2018-12-04 (×10): 15 mL via OROMUCOSAL

## 2018-11-29 MED ORDER — SODIUM BICARBONATE 8.4 % IV SOLN
INTRAVENOUS | Status: AC
  Administered 2018-11-29: 50 meq
  Filled 2018-11-29: qty 50

## 2018-11-29 MED ORDER — INSULIN ASPART 100 UNIT/ML ~~LOC~~ SOLN
0.0000 [IU] | SUBCUTANEOUS | Status: DC
  Administered 2018-11-29 – 2018-12-02 (×10): 2 [IU] via SUBCUTANEOUS
  Administered 2018-12-03: 3 [IU] via SUBCUTANEOUS
  Administered 2018-12-03: 2 [IU] via SUBCUTANEOUS
  Administered 2018-12-03: 3 [IU] via SUBCUTANEOUS
  Administered 2018-12-03 – 2018-12-08 (×12): 2 [IU] via SUBCUTANEOUS

## 2018-11-29 MED ORDER — ORAL CARE MOUTH RINSE
15.0000 mL | OROMUCOSAL | Status: DC
  Administered 2018-11-29 – 2018-12-04 (×45): 15 mL via OROMUCOSAL

## 2018-11-29 MED ORDER — SODIUM BICARBONATE 8.4 % IV SOLN: 25.0000 meq | Freq: Once | INTRAVENOUS | Status: DC

## 2018-11-29 MED ORDER — FENTANYL CITRATE (PF) 100 MCG/2ML IJ SOLN
INTRAMUSCULAR | Status: AC | PRN
  Administered 2018-11-29: 100 ug via INTRAVENOUS

## 2018-11-29 MED ORDER — PHENYLEPHRINE HCL-NACL 10-0.9 MG/250ML-% IV SOLN
0.0000 ug/min | INTRAVENOUS | Status: DC
  Filled 2018-11-29: qty 250

## 2018-11-29 MED ORDER — SODIUM CHLORIDE 0.9 % IV SOLN: INTRAVENOUS | Status: DC | PRN

## 2018-11-29 MED ORDER — ENOXAPARIN SODIUM 40 MG/0.4ML ~~LOC~~ SOLN
40.0000 mg | SUBCUTANEOUS | Status: DC
  Administered 2018-11-29 – 2018-12-03 (×5): 40 mg via SUBCUTANEOUS
  Filled 2018-11-29 (×5): qty 0.4

## 2018-11-29 MED ORDER — CHLORHEXIDINE GLUCONATE CLOTH 2 % EX PADS
6.0000 | MEDICATED_PAD | Freq: Every day | CUTANEOUS | Status: AC
  Administered 2018-11-30 – 2018-12-03 (×5): 6 via TOPICAL

## 2018-11-29 MED ORDER — NOREPINEPHRINE 4 MG/250ML-% IV SOLN
0.0000 ug/min | INTRAVENOUS | Status: DC
  Administered 2018-11-29: 10 ug/min via INTRAVENOUS
  Administered 2018-11-29: 30 ug/min via INTRAVENOUS
  Administered 2018-11-30: 2 ug/min via INTRAVENOUS
  Filled 2018-11-29 (×2): qty 250

## 2018-11-29 MED ORDER — SODIUM CHLORIDE 0.9% FLUSH
10.0000 mL | INTRAVENOUS | Status: DC | PRN
  Administered 2018-12-07 – 2018-12-10 (×2): 10 mL
  Filled 2018-11-29 (×2): qty 40

## 2018-11-29 MED ORDER — SUCCINYLCHOLINE CHLORIDE 20 MG/ML IJ SOLN
INTRAMUSCULAR | Status: AC | PRN
  Administered 2018-11-29: 100 mg via INTRAVENOUS

## 2018-11-29 MED ORDER — ACETAMINOPHEN 325 MG PO TABS
650.0000 mg | ORAL_TABLET | ORAL | Status: DC | PRN
  Administered 2018-11-29 – 2018-12-02 (×2): 650 mg via ORAL
  Filled 2018-11-29 (×2): qty 2

## 2018-11-29 MED ORDER — SODIUM CHLORIDE 0.9 % IV SOLN
INTRAVENOUS | Status: DC | PRN
  Administered 2018-11-29: 13:00:00 via INTRA_ARTERIAL

## 2018-11-29 MED ORDER — LORAZEPAM 2 MG/ML IJ SOLN
0.5000 mg | Freq: Once | INTRAMUSCULAR | Status: AC
  Administered 2018-11-29: 0.5 mg via INTRAVENOUS
  Filled 2018-11-29: qty 1

## 2018-11-29 MED ORDER — SODIUM CHLORIDE 0.9 % IV SOLN
1.0000 g | INTRAVENOUS | Status: DC
  Administered 2018-11-30 – 2018-12-02 (×3): 1 g via INTRAVENOUS
  Filled 2018-11-29 (×3): qty 10

## 2018-11-29 MED ORDER — PANTOPRAZOLE SODIUM 40 MG IV SOLR
40.0000 mg | Freq: Every day | INTRAVENOUS | Status: DC
  Administered 2018-11-29 – 2018-12-01 (×3): 40 mg via INTRAVENOUS
  Filled 2018-11-29 (×3): qty 40

## 2018-11-29 MED ORDER — SODIUM CHLORIDE 0.9% FLUSH
10.0000 mL | Freq: Two times a day (BID) | INTRAVENOUS | Status: DC
  Administered 2018-11-29 – 2018-12-01 (×5): 10 mL
  Administered 2018-12-02 – 2018-12-03 (×3): 30 mL
  Administered 2018-12-03 – 2018-12-04 (×2): 10 mL
  Administered 2018-12-04: 30 mL
  Administered 2018-12-05 – 2018-12-06 (×3): 10 mL
  Administered 2018-12-08: 40 mL

## 2018-11-29 MED ORDER — NITROGLYCERIN IN D5W 200-5 MCG/ML-% IV SOLN: 0.0000 ug/min | INTRAVENOUS | Status: DC

## 2018-11-29 MED ORDER — PROPOFOL 1000 MG/100ML IV EMUL
0.0000 ug/kg/min | INTRAVENOUS | Status: DC
  Administered 2018-11-29: 10 ug/kg/min via INTRAVENOUS

## 2018-11-29 MED ORDER — FENTANYL 2500MCG IN NS 250ML (10MCG/ML) PREMIX INFUSION
25.0000 ug/h | INTRAVENOUS | Status: DC
  Administered 2018-11-29: 25 ug/h via INTRAVENOUS
  Administered 2018-11-30: 125 ug/h via INTRAVENOUS
  Administered 2018-11-30: 150 ug/h via INTRAVENOUS
  Administered 2018-12-01 (×2): 200 ug/h via INTRAVENOUS
  Administered 2018-12-02: 150 ug/h via INTRAVENOUS
  Filled 2018-11-29 (×7): qty 250

## 2018-11-29 MED ORDER — DEXTROSE 5 % IV SOLN
250.0000 mg | INTRAVENOUS | Status: DC
  Administered 2018-11-30 – 2018-12-02 (×3): 250 mg via INTRAVENOUS
  Filled 2018-11-29 (×6): qty 250

## 2018-11-29 NOTE — ED Notes (Signed)
CCM aware of blood pressure. Order placed for neo drip.

## 2018-11-29 NOTE — Procedures (Signed)
Arterial Catheter Insertion Procedure Note VICENT FEBLES [id number] 05-Jul-1961  Procedure: Insertion of Arterial Catheter  Indications: Blood pressure monitoring and Frequent blood sampling  Procedure Details Consent: Unable to obtain consent because of emergent medical necessity. Time Out: Verified patient identification, verified procedure, site/side was marked, verified correct patient position, special equipment/implants available, medications/allergies/relevent history reviewed, required imaging and test results available.  Performed  Maximum sterile technique was used including antiseptics, cap, gloves, gown, hand hygiene, mask and sheet. Skin prep: Chlorhexidine; local anesthetic administered 20 gauge catheter was inserted into right radial artery using the Seldinger technique. ULTRASOUND GUIDANCE USED: NO Evaluation Blood flow good; BP tracing good. Complications: No apparent complications.   Jennet Maduro 11/29/2018

## 2018-11-29 NOTE — ED Notes (Signed)
Critical care at bedside  

## 2018-11-29 NOTE — Code Documentation (Signed)
50mg  bolus of propofol given per EDP.

## 2018-11-29 NOTE — ED Notes (Signed)
Norepi started at 1150 @ 29mcg.

## 2018-11-29 NOTE — ED Notes (Signed)
Rocephin started at 1038.

## 2018-11-29 NOTE — Progress Notes (Signed)
Attempted to call patient's sister and brother and numbers listed are no longer working numbers. Patient stated in ED that he is not married although we do have a number for a spouse listed in [place]. Contacted Samik Balkcom to notify of patient's admission and update. She said is out of town and will notify his sister who she said  lives in [place]. She had no questions.

## 2018-11-29 NOTE — ED Notes (Signed)
Attempted to call pt's family X3 without success.

## 2018-11-29 NOTE — H&P (Addendum)
NAME:  [name], MRN:  [id number], DOB:  03-28-1961, LOS: 0 ADMISSION DATE:  11/29/2018, CONSULTATION DATE:  11/29/2018 REFERRING MD:  Vanita Panda, CHIEF COMPLAINT:  Respiratory distress   Brief History   57yo male with PMH of atrial flutter, bicuspid aortic valve with stenosis s/p AVR, HTN, sCHF, CKD presenting to [facility] via EMS as code STEMI.  History of present illness   Patient intubated and sedated so unable to provide history. History obtained from chart review and discussion with ED staff.   Patient pulled into gas station this AM asking for help due to shortness of breath. EMS transferred to [facility] as Code STEMI based on initial EKG; due to respiratory distress he was placed on Cpap en route but was unable to tolerate. On arrival he was evaluated by cardiology who called off Code STEMI on comparing EKG to previous. On arrival he was able to tolerate Bipap initially; however got acutely agitated when having to go to the restroom which provoked worsened respiratory distress, hypoxia, and lethargy leading to need for intubation.   Initial workup has found a leukocytosis, asymmetric R>L pulmonary infiltrates worsened on re-evaluation.   Past Medical History  atrial flutter, bicuspid aortic valve with stenosis s/p AVR, HTN, sCHF, CKD  Significant Hospital Events   11/29/18: presented to ED in respiratory distress > ETT inserted  Consults:  N/a  Procedures:  11/29/18> ETT 11/29/18> RIJ  Significant Diagnostic Tests:  CXR 12/13: Diffuse increased opacity throughout the right lung likely related to asymmetric edema. CXR 12/13: 1. The ETT is in good position terminating in the mid trachea. 2. Worsening bilateral pulmonary infiltrates, right greater than left. This could represent severe worsening edema versus is a multifocal infectious process. Recommend clinical correlation and follow-up to resolution  EKG 12/13: RBBB, LVH  Micro Data:  n/a  Antimicrobials:  12/13 >>  azithromycin, ceftriaxone   Interim history/subjective:  n/a  Objective   Blood pressure 100/75, pulse 86, temperature 99.7 F (37.6 C), resp. rate (!) 27, height 6' (1.829 m), weight 115.7 kg, SpO2 100 %.    Vent Mode: PRVC FiO2 (%):  [100 %] 100 % Set Rate:  [16 bmp-25 bmp] 25 bmp Vt Set:  [620 mL] 620 mL PEEP:  [12 cmH20] 12 cmH20 Plateau Pressure:  [32 cmH20] 32 cmH20   Intake/Output Summary (Last 24 hours) at 11/29/2018 1028 Last data filed at 11/29/2018 0919 Gross per 24 hour  Intake 50 ml  Output -  Net 50 ml   Filed Weights   11/29/18 0900  Weight: 115.7 kg    Examination: General: Sedated HENT: ETT in place, mild JVD Lungs: CTAB on anterior and lateral lung fields Cardiovascular: RRR, no M/R/G, weak pulses but present and symmetric throughout including radial, femoral, DP/PT Abdomen: distended, soft, decreased BS Extremities: warm, minimal dependent bil LE edema, no calf swelling/warmth/erythema Neuro: medically sedated, pupils equal GU: catheter in place  Resolved Hospital Problem list   n/a  Assessment & Plan:   Hypercarbic, hypoxic respiratory failure requiring trachial intubation: Vent settings currently 100/12/25/630 Likely 2/2 CHF exacerbation with flash pulmonary edema  --Full vent support --F/u ABG --lasix once BP stabilized --Continue azithro and ceftriaxone for now; f/u am CXR  Shock: Likely cardiogenic vs iatrogenic due to needing high level of sedation  --Norepi titrated to MAP >60 --insert arterial line for more accurate monitoring  Anion gap metabolic acidosis: Appears to have DM that is untreated; serum glucose 270 on admission so less likely DKA in a T2  diabetic. Question underlying infectious process with initial leukocytosis and asymmetric pulmonary infiltrate, however this could have been reactive.  --F/u LA, serum ketones, q6hr trops --q4hr CBG and SSI-M for now --f/u AM bmet  Troponemia: Code STEMI canceled on arrival.  Troponin elevated to 0.15. --f/u q6hr troponins --telemetry  Type 2 DM: Doesn't seem to be on meds at home.  --f/u betahydroxybutyric acid --q4hr CBG and SSI for now  CKD stage 3: Cr stable on admission. At risk for ATN with hypotension. --f/u ins/outs --am Bmet  H/o bicuspid AV s/p AVR with bioprosthetic: --continue ASA 325mg  daily  Best practice:  Diet: NPO Pain/Anxiety/Delirium protocol (if indicated): propofol, fentanyl VAP protocol (if indicated):  DVT prophylaxis: lovenox GI prophylaxis: PPI Glucose control: q4 ssi Mobility: bedbound Code Status: FULL Family Communication: none at bedside; unable to be reached by RNs Disposition: Admit to ICU  Labs   CBC: Recent Labs  Lab 11/29/18 0807  WBC 19.7*  NEUTROABS 15.1*  HGB 15.1  HCT 50.9  MCV 90.2  PLT 790    Basic Metabolic Panel: Recent Labs  Lab 11/29/18 0807  NA 140  K 4.2  CL 102  CO2 19*  GLUCOSE 271*  BUN 15  CREATININE 1.86*  CALCIUM 9.2   GFR: Estimated Creatinine Clearance: 57.5 mL/min (A) (by C-G formula based on SCr of 1.86 mg/dL (H)). Recent Labs  Lab 11/29/18 0807  WBC 19.7*    Liver Function Tests: Recent Labs  Lab 11/29/18 0807  AST 44*  ALT 25  ALKPHOS 99  BILITOT 1.5*  PROT 8.5*  ALBUMIN 4.0   No results for input(s): LIPASE, AMYLASE in the last 168 hours. No results for input(s): AMMONIA in the last 168 hours.  ABG    Component Value Date/Time   PHART 7.143 (LL) 11/29/2018 1004   PCO2ART 76.6 (HH) 11/29/2018 1004   PO2ART 67.0 (L) 11/29/2018 1004   HCO3 26.3 11/29/2018 1004   TCO2 29 11/29/2018 1004   ACIDBASEDEF 5.0 (H) 11/29/2018 1004   O2SAT 85.0 11/29/2018 1004     Coagulation Profile: No results for input(s): INR, PROTIME in the last 168 hours.  Cardiac Enzymes: Recent Labs  Lab 11/29/18 0807  TROPONINI 0.15*    HbA1C: Hgb A1c MFr Bld  Date/Time Value Ref Range Status  03/29/2015 05:19 AM 6.9 (H) 4.8 - 5.6 % Final    Comment:    (NOTE)          Pre-diabetes: 5.7 - 6.4         Diabetes: >6.4         Glycemic control for adults with diabetes: <7.0     CBG: No results for input(s): GLUCAP in the last 168 hours.  Review of Systems:   Unable to obtain due to patient sedated and intubated.  Past Medical History  He,  has a past medical history of Aortic stenosis (11/19/2014), Atrial flutter ([place]), Benign essential HTN (11/19/2014), Benign hypertensive heart and renal disease, CHF (congestive heart failure) ([place]), Chronic systolic CHF (congestive heart failure) ([place]) (11/19/2014), Congenital insufficiency of aortic valve, Congestive cardiomyopathy ([place]), Congestive dilated cardiomyopathy ([place]) (11/19/2014), Essential hypertension, malignant, Insomnia, Murmur, and SOB (shortness of breath).   Surgical History    Past Surgical History:  Procedure Laterality Date  . AORTIC VALVE REPLACEMENT N/A 04/05/2015   Procedure: AORTIC VALVE REPLACEMENT (AVR) using a 67mm Edwards Aortic Magna Ease Valve ;  Surgeon: [name], MD;  Location: [place];  Service: Open Heart Surgery;  Laterality: N/A;  .  LEFT AND RIGHT HEART CATHETERIZATION WITH CORONARY ANGIOGRAM N/A 11/27/2014   Procedure: LEFT AND RIGHT HEART CATHETERIZATION WITH CORONARY ANGIOGRAM;  Surgeon: [name], MD;  [facility];  Service: Cardiovascular;  Laterality: N/A;  . MAZE N/A 04/05/2015   Procedure: MAZE;  Surgeon: [name], MD;  Location: [place];  Service: Open Heart Surgery;  Laterality: N/A;  . REPLACEMENT ASCENDING AORTA N/A 04/05/2015   Procedure: REPLACEMENT ASCENDING AORTA with a 37mm Hemashield Platinum Graft;  Surgeon: [name], MD;  Location: [place];  Service: Open Heart Surgery;  Laterality: N/A;  . TEE WITHOUT CARDIOVERSION N/A 04/05/2015   Procedure: TRANSESOPHAGEAL ECHOCARDIOGRAM (TEE);  Surgeon: [name], MD;  Location: [place];  Service: Open Heart Surgery;  Laterality: N/A;     Social History   reports that he has never smoked. He  has never used smokeless tobacco.   Family History   His family history is negative for Heart attack.   Allergies No Known Allergies   Home Medications  Prior to Admission medications   Medication Sig Start Date End Date Taking? Authorizing Provider  albuterol (PROVENTIL HFA;VENTOLIN HFA) 108 (90 BASE) MCG/ACT inhaler Inhale 2 puffs into the lungs every 4 (four) hours as needed for wheezing or shortness of breath.    [provider]  aspirin EC 325 MG EC tablet Take 1 tablet (325 mg total) by mouth daily. 04/23/15   [name], Erin R, PA-C  atorvastatin (LIPITOR) 40 MG tablet Take 40 mg by mouth daily.    [provider]  diphenhydrAMINE (BENADRYL) 25 mg capsule Take 1 capsule (25 mg total) by mouth at bedtime as needed for sleep. 04/23/15   [name], Erin R, PA-C  furosemide (LASIX) 20 MG tablet Take 1 tablet (20 mg total) by mouth daily. 04/23/15   Nani Skillern, PA-C  lisinopril (PRINIVIL,ZESTRIL) 5 MG tablet Take 1 tablet (5 mg total) by mouth daily. 04/23/15   Nani Skillern, PA-C  oxyCODONE (OXY IR/ROXICODONE) 5 MG immediate release tablet Take 1-2 tablets (5-10 mg total) by mouth every 3 (three) hours as needed for severe pain. 04/23/15   [name], Erin R, PA-C  potassium chloride SA (KLOR-CON M20) 20 MEQ tablet Take 1 tablet (20 mEq total) by mouth daily. 11/20/14   Sueanne Margarita, MD  ranitidine (ZANTAC) 150 MG capsule Take 150 mg by mouth daily.    [provider]  vitamin B-12 (CYANOCOBALAMIN) 500 MCG tablet Take 500 mcg by mouth daily.    [provider]     Patient seen and examined, agree with above note.  I dictated the care and orders written for this patient under my direction.  Rush Farmer, [place]

## 2018-11-29 NOTE — Progress Notes (Signed)
Initial Nutrition Assessment  DOCUMENTATION CODES:   Obesity unspecified  INTERVENTION:   -Vital High Protein @ 60 ml/hr (1440 ml) via OGT -30 ml Prostat BID -Liquid MVI daily  Provides: 1640 kcals, 156 grams protein, 1204 ml free water. Meets 100% of needs.   NUTRITION DIAGNOSIS:   Inadequate oral intake related to inability to eat as evidenced by NPO status.  GOAL:   Patient will meet greater than or equal to 90% of their needs  MONITOR:   Diet advancement, Vent status, TF tolerance, Weight trends, Labs, I & O's  REASON FOR ASSESSMENT:   Ventilator    ASSESSMENT:   Patient with PMH significant for atrial flutter, bicuspid aortic valve with stenosis s/p AVR, HTN, CHF, DM, and CKD III.  Presents this admission as code STEMI (canceled upon arrival). Intubated in ED due to respiratory failure likely secondary to CHF exacerbation.    No family at bedside to provide nutrition history. Pt discussed during with RN. Propofol discontinued due to drop in BP. Plan to begin TF tomorrow. Weight records show pt weighed 268 lb Aug/2019 at [facility]. This admissions weight looks to be stated at 255 lb. Will need to obtain actual weight (pt undergoing echo at time of RD visit, unable to obtain bed weight).   Patient is currently intubated on ventilator support MV: 15.2 L/min Temp (24hrs), Avg:99.7 F (37.6 C), Min:98.4 F (36.9 C), Max:100.6 F (38.1 C) BP: 108/82 MAP: 90  I/O: +652 ml since admit UOP: 50 ml since admit  Medications reviewed and include: SSI, sodium bicarb, levophed, propofol- d/c Labs reviewed: Phosphorus 5.4 (H) Glucose 270 (H)   NUTRITION - FOCUSED PHYSICAL EXAM:    Most Recent Value  Orbital Region  No depletion  Upper Arm Region  No depletion  Thoracic and Lumbar Region  Unable to assess  Buccal Region  Unable to assess  Temple Region  No depletion  Clavicle Bone Region  No depletion  Clavicle and Acromion Bone Region  No depletion  Scapular Bone  Region  Unable to assess  Dorsal Hand  No depletion  Patellar Region  No depletion  Anterior Thigh Region  No depletion  Posterior Calf Region  No depletion  Edema (RD Assessment)  None  Hair  Reviewed  Eyes  Unable to assess  Mouth  Unable to assess  Skin  Reviewed  Nails  Reviewed     Diet Order:   Diet Order            Diet NPO time specified  Diet effective now              EDUCATION NEEDS:   Not appropriate for education at this time  Skin:  Skin Assessment: Reviewed RN Assessment  Last BM:  11/29/18  Height:   Ht Readings from Last 1 Encounters:  11/29/18 5\' 11"  (1.803 m)    Weight:   Wt Readings from Last 1 Encounters:  11/29/18 115.7 kg    Ideal Body Weight:  78.2 kg  BMI:  Body mass index is 35.57 kg/m.  Estimated Nutritional Needs:   Kcal:  1273-1620 kcal  Protein:  156-166 grams  Fluid:  >/= 1.5 L/day   Mariana Single RD, LDN Clinical Nutrition Pager # - [phone]

## 2018-11-29 NOTE — Code Documentation (Signed)
ETT placed but seems to be leaking, MD will replace.

## 2018-11-29 NOTE — ED Triage Notes (Signed)
[facility]- [place] with complaint of shortness of breath. Pt very diaphoretic on arrival. SPO2 68% on 15L NRB.

## 2018-11-29 NOTE — Procedures (Addendum)
Central Venous Catheter Insertion Procedure Note [name] [id number] 1961-09-24  Procedure: Insertion of Central Venous Catheter Indications: Drug and/or fluid administration  Procedure Details Consent: Unable to obtain consent because of altered level of consciousness. Time Out: Verified patient identification, verified procedure, site/side was marked, verified correct patient position, special equipment/implants available, medications/allergies/relevent history reviewed, required imaging and test results available.  Performed  Maximum sterile technique was used including antiseptics, cap, gloves, gown, hand hygiene, mask and sheet. Skin prep: Chlorhexidine; local anesthetic administered A antimicrobial bonded/coated triple lumen catheter was placed in the right internal jugular vein using the Seldinger technique.  Evaluation Blood flow good Complications: No apparent complications Patient did tolerate procedure well. Chest X-ray ordered to verify placement.  CXR: pending.  [name] 11/29/2018, 12:28 PM  I was present and supervised the entire procedure.  CXR is pending.  Rush Farmer, M.D. [facility] Pulmonary/Critical Care Medicine. Pager: [phone]. After hours pager: [phone].

## 2018-11-29 NOTE — Consult Note (Addendum)
Cardiology Consultation:   Patient ID: [name] MRN: [id number]; DOB: Jul 15, 1961  Admit date: 11/29/2018 Date of Consult: 11/29/2018  Primary Care Provider: No primary care provider on file. Primary Cardiologist: [name], MD  Primary Electrophysiologist:  None    Patient Profile:   [name] is a 57 y.o. male with a hx of CHF, atrial fib/flutter, aortic stenosis s/p , hypertension, CKD and dilated cardiomyopathy who is being seen today for the evaluation of congestive heart failure at the request of Dr. Nelda Marseille.  History of Present Illness:   [name] was apparently developed acute severe shortness of breath this morning while driving to work. He pulled into a gas station and EMS was called. EMS called a code STEMI.  The patient was put on CPAP for respiratory distress.  On arrival he was evaluated by Dr. Claiborne Billings who felt like EKG was not a STEMI compared to previous.  The patient got increasingly more agitated with worsening respiratory distress, hypoxia and lethargy leading to intubation.  Chest x-ray showed worsening bilateral pulmonary infiltrates right greater than left, worsening edema versus multifocal infectious process.  Mr Campbell was seen by Dr. Radford Pax in 2015 for evaluation of shortness of breath and CHF.  The patient had a hospitalization for CHF in [place] in October 2015.  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 He was discharged to a SNF for rehab in [place]. He has not followed up with cardiology or CT surgery since his surgery according to chart review. .   Past Medical History:  Diagnosis Date  . Aortic stenosis 11/19/2014  . Atrial flutter ([place])   . Benign essential HTN 11/19/2014  . Benign hypertensive heart and renal disease   . CHF (congestive heart failure) ([place])   . Chronic systolic CHF  (congestive heart failure) ([place]) 11/19/2014  . Congenital insufficiency of aortic valve   . Congestive cardiomyopathy ([place])   . Congestive dilated cardiomyopathy ([place]) 11/19/2014  . Essential hypertension, malignant   . Insomnia   . Murmur   . SOB (shortness of breath)     Past Surgical History:  Procedure Laterality Date  . AORTIC VALVE REPLACEMENT N/A 04/05/2015   Procedure: AORTIC VALVE REPLACEMENT (AVR) using a 25m Edwards Aortic Magna Ease Valve ;  Surgeon: PIvin Poot MD;  [facility]  Service: Open Heart Surgery;  Laterality: N/A;  . LEFT AND RIGHT HEART CATHETERIZATION WITH CORONARY ANGIOGRAM N/A 11/27/2014   Procedure: LEFT AND RIGHT HEART CATHETERIZATION WITH CORONARY ANGIOGRAM;  Surgeon: TTroy Sine MD;  [facility];  Service: Cardiovascular;  Laterality: N/A;  . MAZE N/A 04/05/2015   Procedure: MAZE;  Surgeon: PIvin Poot MD;  [facility]  Service: Open Heart Surgery;  Laterality: N/A;  . REPLACEMENT ASCENDING AORTA N/A 04/05/2015   Procedure: REPLACEMENT ASCENDING AORTA with a 3107mHemashield Platinum Graft;  Surgeon: PeIvin PootMD;  [facility] Service: Open Heart Surgery;  Laterality: N/A;  . TEE WITHOUT CARDIOVERSION N/A 04/05/2015   Procedure: TRANSESOPHAGEAL ECHOCARDIOGRAM (TEE);  Surgeon: PeIvin PootMD;  [facility] Service: Open Heart Surgery;  Laterality: N/A;     Home Medications:  Prior to Admission medications   Medication Sig Start Date End Date Taking? Authorizing Provider  albuterol (PROVENTIL HFA;VENTOLIN HFA) 108 (90 BASE) MCG/ACT inhaler Inhale 2 puffs into the  lungs every 4 (four) hours as needed for wheezing or shortness of breath.    [provider]  aspirin EC 325 MG EC tablet Take 1 tablet (325 mg total) by mouth daily. 04/23/15   [name], Erin R, PA-C  atorvastatin (LIPITOR) 40 MG tablet Take 40 mg by mouth daily.    [provider]  diphenhydrAMINE (BENADRYL) 25 mg capsule Take 1  capsule (25 mg total) by mouth at bedtime as needed for sleep. 04/23/15   [name], Erin R, PA-C  furosemide (LASIX) 20 MG tablet Take 1 tablet (20 mg total) by mouth daily. 04/23/15   Nani Skillern, PA-C  lisinopril (PRINIVIL,ZESTRIL) 5 MG tablet Take 1 tablet (5 mg total) by mouth daily. 04/23/15   Nani Skillern, PA-C  oxyCODONE (OXY IR/ROXICODONE) 5 MG immediate release tablet Take 1-2 tablets (5-10 mg total) by mouth every 3 (three) hours as needed for severe pain. 04/23/15   [name], Erin R, PA-C  potassium chloride SA (KLOR-CON M20) 20 MEQ tablet Take 1 tablet (20 mEq total) by mouth daily. 11/20/14   Sueanne Margarita, MD  ranitidine (ZANTAC) 150 MG capsule Take 150 mg by mouth daily.    [provider]  vitamin B-12 (CYANOCOBALAMIN) 500 MCG tablet Take 500 mcg by mouth daily.    [provider]    Inpatient Medications: Scheduled Meds: . [START ON 11/30/2018] aspirin  325 mg Oral Daily  . chlorhexidine gluconate (MEDLINE KIT)  15 mL Mouth Rinse BID  . Chlorhexidine Gluconate Cloth  6 each Topical Daily  . enoxaparin (LOVENOX) injection  40 mg Subcutaneous Q24H  . fentaNYL      . insulin aspart  0-15 Units Subcutaneous Q4H  . mouth rinse  15 mL Mouth Rinse 10 times per day  . pantoprazole (PROTONIX) IV  40 mg Intravenous QHS  . sodium bicarbonate  25 mEq Intravenous Once  . sodium chloride flush  10-40 mL Intracatheter Q12H   Continuous Infusions: . sodium chloride 10 mL/hr at 11/29/18 1600  . sodium chloride    . [START ON 11/30/2018] azithromycin    . [START ON 11/30/2018] cefTRIAXone (ROCEPHIN)  IV    . fentaNYL infusion INTRAVENOUS 90 mcg/hr (11/29/18 1600)  . nitroGLYCERIN Stopped (11/29/18 0949)  . norepinephrine (LEVOPHED) Adult infusion 10 mcg/min (11/29/18 1600)  . phenylephrine (NEO-SYNEPHRINE) Adult infusion    . propofol    . propofol (DIPRIVAN) infusion Stopped (11/29/18 1240)   PRN Meds: Place/Maintain arterial line **AND** sodium  chloride, Place/Maintain arterial line **AND** sodium chloride, acetaminophen, fentaNYL, sodium chloride flush  Allergies:   No Known Allergies  Social History:   Social History   Socioeconomic History  . Marital status: Single    Spouse name: Not on file  . Number of children: Not on file  . Years of education: Not on file  . Highest education level: Not on file  Occupational History  . Not on file  Social Needs  . Financial resource strain: Not on file  . Food insecurity:    Worry: Not on file    Inability: Not on file  . Transportation needs:    Medical: Not on file    Non-medical: Not on file  Tobacco Use  . Smoking status: Never Smoker  . Smokeless tobacco: Never Used  Substance and Sexual Activity  . Alcohol use: Not on file  . Drug use: Not on file  . Sexual activity: Not on file  Lifestyle  . Physical activity:    Days  per week: Not on file    Minutes per session: Not on file  . Stress: Not on file  Relationships  . Social connections:    Talks on phone: Not on file    Gets together: Not on file    Attends religious service: Not on file    Active member of club or organization: Not on file    Attends meetings of clubs or organizations: Not on file    Relationship status: Not on file  . Intimate partner violence:    Fear of current or ex partner: Not on file    Emotionally abused: Not on file    Physically abused: Not on file    Forced sexual activity: Not on file  Other Topics Concern  . Not on file  Social History Narrative  . Not on file    Family History:    Family History  Problem Relation Age of Onset  . Heart attack Neg Hx      ROS:  Please see the history of present illness.    All other ROS unable to obtain due to pt being sedated and intubated.   Physical Exam/Data:   Vitals:   11/29/18 1426 11/29/18 1500 11/29/18 1543 11/29/18 1600  BP: 97/60 108/82  118/82  Pulse: (!) 55 (!) 48  (!) 51  Resp: (!) 25 (!) 25  (!) 25  Temp:  (!)  100.6 F (38.1 C)  (!) 100.6 F (38.1 C)  TempSrc:      SpO2: 98% 99%  99%  Weight:      Height:   '5\' 11"'$  (1.803 m)     Intake/Output Summary (Last 24 hours) at 11/29/2018 1635 Last data filed at 11/29/2018 1600 Gross per 24 hour  Intake 759.29 ml  Output 110 ml  Net 649.29 ml   Filed Weights   11/29/18 0900  Weight: 115.7 kg   Body mass index is 35.57 kg/m.  General:  Well nourished, well developed, intubated HEENT: normal Lymph: no adenopathy Neck: unable to assess JVD due to central line in neck Vascular: No carotid bruits; FA pulses 2+ bilaterally without bruits  Cardiac:  normal S1, S2; RRR; 2/6 systolic murmur Lungs:  clear to auscultation bilaterally, no wheezing, rhonchi or rales, mechanically ventilated Abd: soft, nontender, no hepatomegaly  Ext: no edema Musculoskeletal:  No deformities Skin: warm and dry  Neuro:  sedated Psych:  sedated  EKG:  The EKG was personally reviewed and demonstrates:  unclear rhythym, PVCs, Nonspecific IVCD with LAD Telemetry:  Telemetry was personally reviewed and demonstrates:  Unable to tell if sinus, see occ P waves sometimes looks like long 1st degree, other times only occ P waves with no specific relation to QRS.  Relevant CV Studies:  Echo pending  Echo 04/20/2015:  EF 50-55%, moderate hypokinesis, bioprosthetic aortic valve with mild regurgitation, mildly dilated left atrium, moderately dilated right atrium, small- mod free-flowing pericardial effusion without hemodynamic compromise  Echo 04/05/2015: EF 25-30%  Laboratory Data:  Chemistry Recent Labs  Lab 11/29/18 0807  NA 140  K 4.2  CL 102  CO2 19*  GLUCOSE 271*  BUN 15  CREATININE 1.86*  CALCIUM 9.2  GFRNONAA 39*  GFRAA 46*  ANIONGAP 19*    Recent Labs  Lab 11/29/18 0807  PROT 8.5*  ALBUMIN 4.0  AST 44*  ALT 25  ALKPHOS 99  BILITOT 1.5*   Hematology Recent Labs  Lab 11/29/18 0807  WBC 19.7*  RBC 5.64  HGB 15.1  HCT  50.9  MCV 90.2  MCH 26.8   MCHC 29.7*  RDW 14.7  PLT 195   Cardiac Enzymes Recent Labs  Lab 11/29/18 0807 11/29/18 1249  TROPONINI 0.15* 0.25*   No results for input(s): TROPIPOC in the last 168 hours.  BNP Recent Labs  Lab 11/29/18 0808  BNP 1,232.9*    DDimer No results for input(s): DDIMER in the last 168 hours.  Radiology/Studies:  Dg Chest Port 1 View  Result Date: 11/29/2018 CLINICAL DATA:  Initial evaluation for central line placement. EXAM: PORTABLE CHEST 1 VIEW COMPARISON:  Prior radiograph from earlier the same day. FINDINGS: Patient remains intubated with the tip of an endotracheal tube positioned well above the carina. Enteric tube courses into the abdomen. Interval placement of a right IJ approach centra venous catheter with tip seen overlying the distal SVC. Prominent cardiomegaly, unchanged. Mediastinal silhouette stable, and remains within normal limits. Lungs normally inflated. Persistent fairly extensive bilateral airspace opacities, right worse than left, slightly improved from previous, suspected to reflect a degree of improving edema given rapid change. Infiltrates could also be considered. No appreciable pleural effusion. No pneumothorax. Osseous structures unchanged. IMPRESSION: 1. Interval placement of right IJ approach central venous catheter with tip overlying the distal SVC. Remaining support apparatus in satisfactory position. 2. Interval improvement in extensive bilateral airspace opacities, right greater than left, suspected to reflect improved edema given fairly rapid change from previous. Again, multifocal infiltrates could also be considered. Electronically Signed   By: Jeannine Boga M.D.   On: 11/29/2018 13:43   Dg Chest Port 1 View  Result Date: 11/29/2018 CLINICAL DATA:  ETT placement EXAM: PORTABLE CHEST 1 VIEW COMPARISON:  November 29, 2017 FINDINGS: An ET tube is been placed, terminating in the mid trachea, in good position. No pneumothorax. Diffuse infiltrate  throughout the right lung, worsened in the interval. Increasing opacity throughout the left lung, worsened in the interval. No other changes. IMPRESSION: 1. The ETT is in good position terminating in the mid trachea. 2. Worsening bilateral pulmonary infiltrates, right greater than left. This could represent severe worsening edema versus is a multifocal infectious process. Recommend clinical correlation and follow-up to resolution. Electronically Signed   By: Dorise Bullion III M.D   On: 11/29/2018 09:41   Dg Chest Port 1 View  Result Date: 11/29/2018 CLINICAL DATA:  Respiratory distress and hypoxia EXAM: PORTABLE CHEST 1 VIEW COMPARISON:  04/23/2015 FINDINGS: Cardiac shadow remains enlarged. Postsurgical changes are again seen. Right PICC line has been removed in the interval. The lungs are well aerated bilaterally. Diffuse right-sided parenchymal opacity is noted likely related to asymmetric edema or underlying pneumonic infiltrate. No sizable effusion is seen. The left lung is clear. No bony abnormality is noted. IMPRESSION: Diffuse increased opacity throughout the right lung likely related to asymmetric edema. Electronically Signed   By: Inez Catalina M.D.   On: 11/29/2018 08:24   Dg Abd Portable 1 View  Result Date: 11/29/2018 CLINICAL DATA:  Evaluate OG tube EXAM: PORTABLE ABDOMEN - 1 VIEW COMPARISON:  None. FINDINGS: The OG tube terminates in the left upper quadrant, within the stomach. IMPRESSION: The enteric tube terminates in the left upper quadrant of the abdomen, in the stomach. Electronically Signed   By: Dorise Bullion III M.D   On: 11/29/2018 09:42    Assessment and Plan:   1. Acute on chronic systolic heart failure/dilated cardiomyopathy -BNP 1233, troponin  0.25. Continue to trend troponins -Home medications include ACE inhibitor, Lasix, potassium -Pt had multiple  episodes of dyspnea and CHF with EF 25-30% prior to AVR, up to 50-55% after -Apparently no cardiology follow up since  AVR in 2016 unless possibly in [place], no in [place]. -Pt with acute shortness of breath with resp distress this morning. No apparent chest pain. Pt is sedated and intubated currently.  -Echo done, pending results -Does not appear significantly volume overloaded.  -Diurese when hemodynamically stable.  2. Hypercarbic, hypoxic respiratory failure and shock -Management per PCCM, patient is intubated -Fever and leukocytosis with WBC 19.7.  -Possible respiratory infectious process vs flash pulmonary edema/CHF exacerbation -Flu negative -Chest x-ray showed worsening bilateral pulmonary infiltrates right greater than left, worsening edema versus multifocal infectious process. -Pt is on norepinephrine for hypotension. On IV antibiotics.   3. History of atrial fib/flutter -S/P Maze procedure with his AVR in 2016. Has had no cardiology follow up that I can find in [place]. Not anticoagulated as an outpt.  -Unclear cardiac rhythm, possibly long first degree vs CHB, vs wandering atrial pacemaker. Rate in 50's  4. Hypertension -Home medications include Lasix, lisinopril 5 mg -now hypotensive with shock, on pressor  5. Aortic stenosis S/p tissue AVR 03/2015 -Prior to admission patient was on aspirin 325 mg daily -Echo in process  6. CKD -Serum creatinine 1.86.  Last level was noted in care everywhere on 09/14/2017 at 1.79.  Possible ATN related to hypotension and hypoperfusion -Monitor serum creatinine closely   7. Hyperlipidemia -On atorvastatin 40 mg daily      For questions or updates, please contact [place] Please consult [email or web address] for contact info under     Signed, Daune Perch, NP  11/29/2018 4:35 PM

## 2018-11-29 NOTE — Progress Notes (Signed)
  Echocardiogram 2D Echocardiogram has been performed.  Mateus Rewerts G Tensley Wery 11/29/2018, 4:02 PM

## 2018-11-29 NOTE — H&P (Signed)
NAME:  [name], MRN:  [id number], DOB:  [birth year], LOS: 0 ADMISSION DATE:  11/29/2018, CONSULTATION DATE:  11/29/2018 REFERRING MD:  Vanita Panda, CHIEF COMPLAINT:  Respiratory distress   Brief History   57yo male with PMH of atrial flutter, bicuspid aortic valve with stenosis s/p AVR, HTN, sCHF, CKD presenting to [facility] via EMS as code STEMI.  History of present illness   Patient intubated and sedated so unable to provide history. History obtained from chart review and discussion with ED staff.   Patient pulled into gas station this AM asking for help due to shortness of breath. EMS transferred to [facility] as Code STEMI based on initial EKG; due to respiratory distress he was placed on Cpap en route but was unable to tolerate. On arrival he was evaluated by cardiology who called off Code STEMI on comparing EKG to previous. On arrival he was able to tolerate Bipap initially; however got acutely agitated when having to go to the restroom which provoked worsened respiratory distress, hypoxia, and lethargy leading to need for intubation.   Initial workup has found a leukocytosis, asymmetric R>L pulmonary infiltrates worsened on re-evaluation.   Past Medical History  atrial flutter, bicuspid aortic valve with stenosis s/p AVR, HTN, sCHF, CKD  Significant Hospital Events   11/29/18: presented to ED in respiratory distress > ETT inserted  Consults:  N/a  Procedures:  11/29/18> ETT 11/29/18> RIJ  Significant Diagnostic Tests:  CXR 12/13: Diffuse increased opacity throughout the right lung likely related to asymmetric edema. CXR 12/13: 1. The ETT is in good position terminating in the mid trachea. 2. Worsening bilateral pulmonary infiltrates, right greater than left. This could represent severe worsening edema versus is a multifocal infectious process. Recommend clinical correlation and follow-up to resolution  EKG 12/13: RBBB, LVH  Micro Data:  n/a  Antimicrobials:  12/13 >>  azithromycin, ceftriaxone   Interim history/subjective:  Patient arrived to the ICU profoundly hypotensive and agitated  Objective   Blood pressure 111/83, pulse (!) 37, temperature 99.7 F (37.6 C), temperature source Bladder, resp. rate (!) 25, height 6' (1.829 m), weight 115.7 kg, SpO2 90 %.    Vent Mode: PRVC FiO2 (%):  [100 %] 100 % Set Rate:  [16 bmp-25 bmp] 25 bmp Vt Set:  [119 mL] 620 mL PEEP:  [12 cmH20] 12 cmH20 Plateau Pressure:  [32 cmH20-36 cmH20] 36 cmH20   Intake/Output Summary (Last 24 hours) at 11/29/2018 1302 Last data filed at 11/29/2018 1123 Gross per 24 hour  Intake 150 ml  Output -  Net 150 ml   Filed Weights   11/29/18 0900  Weight: 115.7 kg    Examination: General: Agitated, moving all ext spontaneously HENT: [place]/AT, PERRL, EOM-I and MMM Lungs: Diffuse crackles Cardiovascular: RRR, Nl S1/S2 and -M/R/G Abdomen: Soft, obese, NT, ND and +BS Extremities: 2+ edema bilaterally Neuro: Agitated, moving all ext spontaneously GU: catheter in place Skin: intact  Resolved Hospital Problem list   n/a  Assessment & Plan:   Hypercarbic, hypoxic respiratory failure requiring trachial intubation: Vent settings currently 100/12/25/630 Likely 2/2 CHF exacerbation with flash pulmonary edema  - Maintain on full vent support - F/u ABG and adjust vent for ABG - Would like to diurese but will hold while in cardiogenic shock - Continue azithro and ceftriaxone for now; f/u am CXR - PCT  Shock: Likely cardiogenic vs iatrogenic due to needing high level of sedation  - Norepi titrated to MAP >65 - Aline to be placed for ABG and  BP monitoring - KVO IVF - Follow CVP qshift  Anion gap metabolic acidosis: Appears to have DM that is untreated; serum glucose 270 on admission so less likely DKA in a T2 diabetic. Question underlying infectious process with initial leukocytosis and asymmetric pulmonary infiltrate, however this could have been reactive.  - F/u LA, serum  ketones, q6hr trops - Q4hr CBG and SSI-M for now - F/u AM bmet - Replace electrolytes as indicated  Troponemia: Code STEMI canceled on arrival. Troponin elevated to 0.15. - F/u q6hr troponins - Telemetry - Cardiology consult called defer heparinization to cards  Type 2 DM: Doesn't seem to be on meds at home.  - F/u betahydroxybutyric acid - Q4hr CBG and SSI for now - ISS  CKD stage 3: Cr stable on admission. At risk for ATN with hypotension. - F/u ins/outs - Am Bmet  H/o bicuspid AV s/p AVR with bioprosthetic: - Continue ASA 325mg  daily  Best practice:  Diet: NPO Pain/Anxiety/Delirium protocol (if indicated): propofol, fentanyl VAP protocol (if indicated):  DVT prophylaxis: lovenox GI prophylaxis: PPI Glucose control: q4 ssi Mobility: bedbound Code Status: FULL Family Communication: none at bedside; unable to be reached by RNs Disposition: Admit to ICU  Labs   CBC: Recent Labs  Lab 11/29/18 0807  WBC 19.7*  NEUTROABS 15.1*  HGB 15.1  HCT 50.9  MCV 90.2  PLT 326    Basic Metabolic Panel: Recent Labs  Lab 11/29/18 0807  NA 140  K 4.2  CL 102  CO2 19*  GLUCOSE 271*  BUN 15  CREATININE 1.86*  CALCIUM 9.2   GFR: Estimated Creatinine Clearance: 57.5 mL/min (A) (by C-G formula based on SCr of 1.86 mg/dL (H)). Recent Labs  Lab 11/29/18 0807  WBC 19.7*    Liver Function Tests: Recent Labs  Lab 11/29/18 0807  AST 44*  ALT 25  ALKPHOS 99  BILITOT 1.5*  PROT 8.5*  ALBUMIN 4.0   No results for input(s): LIPASE, AMYLASE in the last 168 hours. No results for input(s): AMMONIA in the last 168 hours.  ABG    Component Value Date/Time   PHART 7.143 (LL) 11/29/2018 1004   PCO2ART 76.6 (HH) 11/29/2018 1004   PO2ART 67.0 (L) 11/29/2018 1004   HCO3 26.3 11/29/2018 1004   TCO2 29 11/29/2018 1004   ACIDBASEDEF 5.0 (H) 11/29/2018 1004   O2SAT 85.0 11/29/2018 1004     Coagulation Profile: No results for input(s): INR, PROTIME in the last 168  hours.  Cardiac Enzymes: Recent Labs  Lab 11/29/18 0807  TROPONINI 0.15*    HbA1C: Hgb A1c MFr Bld  Date/Time Value Ref Range Status  03/29/2015 05:19 AM 6.9 (H) 4.8 - 5.6 % Final    Comment:    (NOTE)         Pre-diabetes: 5.7 - 6.4         Diabetes: >6.4         Glycemic control for adults with diabetes: <7.0     CBG: No results for input(s): GLUCAP in the last 168 hours.  Review of Systems:   Unable to obtain due to patient sedated and intubated.  Past Medical History  He,  has a past medical history of Aortic stenosis (11/19/2014), Atrial flutter ([place]), Benign essential HTN (11/19/2014), Benign hypertensive heart and renal disease, CHF (congestive heart failure) ([place]), Chronic systolic CHF (congestive heart failure) ([place]) (11/19/2014), Congenital insufficiency of aortic valve, Congestive cardiomyopathy ([place]), Congestive dilated cardiomyopathy ([place]) (11/19/2014), Essential hypertension, malignant, Insomnia, Murmur, and SOB (shortness  of breath).   Surgical History    Past Surgical History:  Procedure Laterality Date  . AORTIC VALVE REPLACEMENT N/A 04/05/2015   Procedure: AORTIC VALVE REPLACEMENT (AVR) using a 77mm Edwards Aortic Magna Ease Valve ;  Surgeon: [name], MD;  Location: [place];  Service: Open Heart Surgery;  Laterality: N/A;  . LEFT AND RIGHT HEART CATHETERIZATION WITH CORONARY ANGIOGRAM N/A 11/27/2014   Procedure: LEFT AND RIGHT HEART CATHETERIZATION WITH CORONARY ANGIOGRAM;  Surgeon: [name], MD;  Location: [facility] C[facility] LAB;  Service: Cardiovascular;  Laterality: N/A;  . MAZE N/A 04/05/2015   Procedure: MAZE;  Surgeon: [name], MD;  Location: [place];  Service: Open Heart Surgery;  Laterality: N/A;  . REPLACEMENT ASCENDING AORTA N/A 04/05/2015   Procedure: REPLACEMENT ASCENDING AORTA with a 13mm Hemashield Platinum Graft;  Surgeon: [name], MD;  Location: [place];  Service: Open Heart Surgery;  Laterality: N/A;  . TEE WITHOUT CARDIOVERSION N/A  04/05/2015   Procedure: TRANSESOPHAGEAL ECHOCARDIOGRAM (TEE);  Surgeon: [name], MD;  Location: [place];  Service: Open Heart Surgery;  Laterality: N/A;     Social History   reports that he has never smoked. He has never used smokeless tobacco.   Family History   His family history is negative for Heart attack.   Allergies No Known Allergies   Home Medications  Prior to Admission medications   Medication Sig Start Date End Date Taking? Authorizing Provider  albuterol (PROVENTIL HFA;VENTOLIN HFA) 108 (90 BASE) MCG/ACT inhaler Inhale 2 puffs into the lungs every 4 (four) hours as needed for wheezing or shortness of breath.    [provider]  aspirin EC 325 MG EC tablet Take 1 tablet (325 mg total) by mouth daily. 04/23/15   [name], Erin R, PA-C  atorvastatin (LIPITOR) 40 MG tablet Take 40 mg by mouth daily.    [provider]  diphenhydrAMINE (BENADRYL) 25 mg capsule Take 1 capsule (25 mg total) by mouth at bedtime as needed for sleep. 04/23/15   [name], Erin R, PA-C  furosemide (LASIX) 20 MG tablet Take 1 tablet (20 mg total) by mouth daily. 04/23/15   Nani Skillern, PA-C  lisinopril (PRINIVIL,ZESTRIL) 5 MG tablet Take 1 tablet (5 mg total) by mouth daily. 04/23/15   Nani Skillern, PA-C  oxyCODONE (OXY IR/ROXICODONE) 5 MG immediate release tablet Take 1-2 tablets (5-10 mg total) by mouth every 3 (three) hours as needed for severe pain. 04/23/15   [name], Erin R, PA-C  potassium chloride SA (KLOR-CON M20) 20 MEQ tablet Take 1 tablet (20 mEq total) by mouth daily. 11/20/14   Sueanne Margarita, MD  ranitidine (ZANTAC) 150 MG capsule Take 150 mg by mouth daily.    [provider]  vitamin B-12 (CYANOCOBALAMIN) 500 MCG tablet Take 500 mcg by mouth daily.    [provider]    The patient is critically ill with multiple organ systems failure and requires high complexity decision making for assessment and support, frequent evaluation and titration of  therapies, application of advanced monitoring technologies and extensive interpretation of multiple databases.   Critical Care Time devoted to patient care services described in this note is  45  Minutes. This time reflects time of care of this signee Dr Jennet Maduro. This critical care time does not reflect procedure time, or teaching time or supervisory time of PA/NP/Med student/Med Resident etc but could involve care discussion time.  Rush Farmer, M.D. [facility] Pulmonary/Critical Care  Medicine. Pager: [phone]. After hours pager: [phone].

## 2018-11-29 NOTE — ED Provider Notes (Signed)
[facility] EMERGENCY DEPARTMENT Provider Note   CSN: [id number] Arrival date & time: 11/29/18  0753     History   Chief Complaint Chief Complaint  Patient presents with  . Respiratory Distress    HPI [name] is a 57 y.o. male.  HPI Patient presents in respiratory distress via EMS. Patient was designated as a code STEMI in the field. The patient himself is dyspneic, incapable providing substantial details of his HPI History provided by EMS providers. Level 5 caveat secondary to acuity of condition. Reportedly the patient was feeling generally well before becoming acutely short of breath while in route to work about 1 hour ago. Patient nods to affirm this, nods to from the absence of pain anywhere, fever, confusion. Patient acknowledges history of cardiac disease. EMS notes the patient was hypertensive, dyspneic, hypoxic on arrival. He received sublingual nitroglycerin with some decrease in blood pressure, but no substantial change in respiratory effort. Patient was intolerant of noninvasive ventilatory support beyond supplemental oxygen.   Past Medical History:  Diagnosis Date  . Aortic stenosis 11/19/2014  . Atrial flutter ([place])   . Benign essential HTN 11/19/2014  . Benign hypertensive heart and renal disease   . CHF (congestive heart failure) ([place])   . Chronic systolic CHF (congestive heart failure) ([place]) 11/19/2014  . Congenital insufficiency of aortic valve   . Congestive cardiomyopathy ([place])   . Congestive dilated cardiomyopathy ([place]) 11/19/2014  . Essential hypertension, malignant   . Insomnia   . Murmur   . SOB (shortness of breath)     Patient Active Problem List   Diagnosis Date Noted  . S/P AVR (aortic valve replacement) and aortoplasty 04/05/2015  . Aortic stenosis 03/29/2015  . Bicuspid aortic valve 03/29/2015  . Elevated troponin 03/28/2015  . Acute on chronic systolic heart failure ([place]) 03/27/2015  . Weakness  generalized 11/27/2014  . Aortic insufficiency 11/19/2014  . Congestive dilated cardiomyopathy ([place]) 11/19/2014  . Essential hypertension 11/19/2014  . 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 Chronic systolic CHF (congestive heart failure) ([place]) 11/19/2014  . SOB (shortness of breath) 11/19/2014  . Atrial flutter ([place]) 11/19/2014    Past Surgical History:  Procedure Laterality Date  . AORTIC VALVE REPLACEMENT N/A 04/05/2015   Procedure: AORTIC VALVE REPLACEMENT (AVR) using a 92mm Edwards Aortic Magna Ease Valve ;  Surgeon: [name], MD;  Location: [place];  Service: Open Heart Surgery;  Laterality: N/A;  . LEFT AND RIGHT HEART CATHETERIZATION WITH CORONARY ANGIOGRAM N/A 11/27/2014   Procedure: LEFT AND RIGHT HEART CATHETERIZATION WITH CORONARY ANGIOGRAM;  Surgeon: [name], MD;  Location: [facility] CATH LAB;  Service: Cardiovascular;  Laterality: N/A;  . MAZE N/A 04/05/2015   Procedure: MAZE;  Surgeon: [name], MD;  Location: [place];  Service: Open Heart Surgery;  Laterality: N/A;  . REPLACEMENT ASCENDING AORTA N/A 04/05/2015   Procedure: REPLACEMENT ASCENDING AORTA with a 63mm Hemashield Platinum Graft;  Surgeon: [name], MD;  Location: [place];  Service: Open Heart Surgery;  Laterality: N/A;  . TEE WITHOUT CARDIOVERSION N/A 04/05/2015   Procedure: TRANSESOPHAGEAL ECHOCARDIOGRAM (TEE);  Surgeon: [name], MD;  Location: [place];  Service: Open Heart Surgery;  Laterality: N/A;        Home Medications    Prior to Admission medications   Medication Sig Start Date End Date Taking? Authorizing Provider  albuterol (PROVENTIL HFA;VENTOLIN HFA) 108 (90 BASE) MCG/ACT inhaler Inhale 2 puffs into the lungs  every 4 (four) hours as needed for wheezing or shortness of breath.    [provider]  aspirin EC 325 MG EC tablet Take 1 tablet (325 mg total) by mouth daily. 04/23/15   [name], Erin R, PA-C  atorvastatin (LIPITOR) 40 MG tablet Take 40 mg by mouth  daily.    [provider]  diphenhydrAMINE (BENADRYL) 25 mg capsule Take 1 capsule (25 mg total) by mouth at bedtime as needed for sleep. 04/23/15   [name], Erin R, PA-C  furosemide (LASIX) 20 MG tablet Take 1 tablet (20 mg total) by mouth daily. 04/23/15   Nani Skillern, PA-C  lisinopril (PRINIVIL,ZESTRIL) 5 MG tablet Take 1 tablet (5 mg total) by mouth daily. 04/23/15   Nani Skillern, PA-C  oxyCODONE (OXY IR/ROXICODONE) 5 MG immediate release tablet Take 1-2 tablets (5-10 mg total) by mouth every 3 (three) hours as needed for severe pain. 04/23/15   [name], Erin R, PA-C  potassium chloride SA (KLOR-CON M20) 20 MEQ tablet Take 1 tablet (20 mEq total) by mouth daily. 11/20/14   Sueanne Margarita, MD  ranitidine (ZANTAC) 150 MG capsule Take 150 mg by mouth daily.    [provider]  vitamin B-12 (CYANOCOBALAMIN) 500 MCG tablet Take 500 mcg by mouth daily.    [provider]    Family History Family History  Problem Relation Age of Onset  . Heart attack Neg Hx     Social History Social History   Tobacco Use  . Smoking status: Never Smoker  . Smokeless tobacco: Never Used  Substance Use Topics  . Alcohol use: Not on file  . Drug use: Not on file     Allergies   Patient has no known allergies.   Review of Systems Review of Systems  Unable to perform ROS: Acuity of condition     Physical Exam Updated Vital Signs BP (!) 172/96 (BP Location: Right Arm)   Pulse 82   Resp (!) 31   SpO2 (!) 71%   Physical Exam Vitals signs and nursing note reviewed.  Constitutional:      General: He is in acute distress.     Appearance: He is well-developed. He is ill-appearing and diaphoretic.  HENT:     Head: Normocephalic and atraumatic.  Eyes:     Conjunctiva/sclera: Conjunctivae normal.  Cardiovascular:     Rate and Rhythm: Regular rhythm. Tachycardia present.  Pulmonary:     Effort: Tachypnea and respiratory distress present.     Breath sounds:  Decreased air movement present. No stridor. Decreased breath sounds present.  Abdominal:     General: There is no distension.  Skin:    General: Skin is warm.       Neurological:     Mental Status: He is alert and oriented to person, place, and time.  Psychiatric:        Mood and Affect: Mood is anxious.      ED Treatments / Results  Labs (all labs ordered are listed, but only abnormal results are displayed) Labs Reviewed  COMPREHENSIVE METABOLIC PANEL  CBC WITH DIFFERENTIAL/PLATELET  TROPONIN I  BRAIN NATRIURETIC PEPTIDE    EKG EKG Interpretation  Date/Time:  Friday November 29 2018 07:57:50 EST Ventricular Rate:  80 PR Interval:    QRS Duration: 178 QT Interval:  432 QTC Calculation: 499 R Axis:   -82 Text Interpretation:  unclear rhythym Ventricular premature complex Nonspecific IVCD with LAD ST-t wave abnormality Abnormal ekg Confirmed by Carmin Muskrat [phone])  on 11/29/2018 8:08:28 AM   Radiology Dg [facility] - after arrival (1)  Result Date: 11/29/2018 CLINICAL DATA:  Respiratory distress and hypoxia EXAM: PORTABLE CHEST 1 VIEW COMPARISON:  04/23/2015 FINDINGS: Cardiac shadow remains enlarged. Postsurgical changes are again seen. Right PICC line has been removed in the interval. The lungs are well aerated bilaterally. Diffuse right-sided parenchymal opacity is noted likely related to asymmetric edema or underlying pneumonic infiltrate. No sizable effusion is seen. The left lung is clear. No bony abnormality is noted. IMPRESSION: Diffuse increased opacity throughout the right lung likely related to asymmetric edema. Electronically Signed   By: Inez Catalina M.D.   On: 11/29/2018 08:24      Procedures  INTUBATION Performed by: Carmin Muskrat  Required items: required blood products, implants, devices, and special equipment available Patient identity confirmed: provided demographic data and hospital-assigned identification number Time out: Immediately  prior to procedure a "time out" was called to verify the correct patient, procedure, equipment, support staff and site/side marked as required.  Indications: respiratory distress  Intubation method: Glidescope Laryngoscopy   Preoxygenation: BVM  Sedatives: 20Etomidate Paralytic: 100Succinylcholine  Tube Size: 7.5 cuffed  Post-procedure assessment: chest rise and ETCO2 monitor Breath sounds: equal and absent over the epigastrium Tube secured with: ETT holder Chest x-ray interpreted by radiologist and me.  Chest x-ray findings: endotracheal tube in appropriate position  Patient tolerated the procedure well with no immediate complications.     CRITICAL CARE Performed by: Carmin Muskrat Total critical care time: 45 minutes Critical care time was exclusive of separately billable procedures and treating other patients. Critical care was necessary to treat or prevent imminent or life-threatening deterioration. Critical care was time spent personally by me on the following activities: development of treatment plan with patient and/or surrogate as well as nursing, discussions with consultants, evaluation of patient's response to treatment, examination of patient, obtaining history from patient or surrogate, ordering and performing treatments and interventions, ordering and review of laboratory studies, ordering and review of radiographic studies, pulse oximetry and re-evaluation of patient's condition.         Medications Ordered in ED Medications  nitroGLYCERIN (NITROGLYN) 2 % ointment 1 inch (has no administration in time range)  albuterol (PROVENTIL,VENTOLIN) solution continuous neb (has no administration in time range)  magnesium sulfate IVPB 2 g 50 mL (has no administration in time range)  LORazepam (ATIVAN) injection 0.5 mg (has no administration in time range)     Initial Impression / Assessment and Plan / ED Course  I have reviewed the triage vital signs and the  nursing notes.  Pertinent labs & imaging results that were available during my care of the patient were reviewed by me and considered in my medical decision making (see chart for details).    Immediately after the initial evaluation with concern for respiratory distress, possible STEMI, patient was evaluated by myself and our cardiology colleagues per Code STEMI was canceled, and chart review performed. Notable for patient having cardiomyopathy, prior aortic aneurysm repair. Patient received continuous albuterol via noninvasive ventilatory support, BiPAP, as well as nitroglycerin, and magnesium.  Update: Bedside x-ray notable for right-sided opacification, consistent with edema.  8:54 AM Patient's work of breathing has improved, but he remains tachypneic, and hypertensive, after decrease in diastolic below 650, most recent blood pressure is 180/120 Patient is starting nitroglycerin drip. With concern for his asymmetrical opacification, increased work of breathing, patient will have antibiotic coverage, while having additional blood pressure control.  9:34 AM  Patient became acutely agitated, stating he had to go to the bathroom, but came intolerant of all interventions including BiPAP. Initial blood gas had looked encouraging, but patient's pulse oximetry dropped precipitously, with his agitation, lack of tolerance of interventions, the patient required emergent intubation. This was performed, per protocol RSI, with etomidate, succinylcholine. Lung complication was ruptured endotracheal tube cuff, this was replaced via bougie, with visualization on x-ray subsequently. Subsequently the patient's pulse oximetry was in the 70s, he required copious sedation and suctioning, as well as continuous mechanical ventilation and sedation.  Initial labs notable for leukocytosis, troponin elevation  This large patient with a history of cardiomyopathy presents in respiratory distress. Patient had  transient improvement, after receiving noninvasive ventilatory support, but became agitated, was intolerant of additional therapy, required emergent intubation. Patient's initial findings concerning for possible pneumonitis given his asymmetric edema versus flash pulmonary edema.  9:59 AM Pulse oximetry now 100%, patient on continuous fentanyl and propofol. Remaining labs notable for elevated BNP consistent with the patient's consistent history of heart failure. Elevated troponin, consistent with concern for ischemic demand, though absent chest pain on arrival, and with other likely primary sources for his troponin elevation, lower suspicion for acute coronary syndrome.     Final Clinical Impressions(s) / ED Diagnoses  Respiratory distress   Carmin Muskrat, MD 11/29/18 1001

## 2018-11-29 NOTE — Code Documentation (Signed)
Pt became more lethargic. MD at bedside to intubate

## 2018-11-30 ENCOUNTER — Inpatient Hospital Stay (HOSPITAL_COMMUNITY): Payer: Medicare Other

## 2018-11-30 DIAGNOSIS — I5041 Acute combined systolic (congestive) and diastolic (congestive) heart failure: Secondary | ICD-10-CM

## 2018-11-30 LAB — BASIC METABOLIC PANEL
Anion gap: 12 (ref 5–15)
Anion gap: 11 (ref 5–15)
BUN: 21 mg/dL — ABNORMAL HIGH (ref 6–20)
BUN: 24 mg/dL — ABNORMAL HIGH (ref 6–20)
CHLORIDE: 105 mmol/L (ref 98–111)
CO2: 24 mmol/L (ref 22–32)
CO2: 26 mmol/L (ref 22–32)
Calcium: 8.4 mg/dL — ABNORMAL LOW (ref 8.9–10.3)
Calcium: 8.7 mg/dL — ABNORMAL LOW (ref 8.9–10.3)
Chloride: 106 mmol/L (ref 98–111)
Creatinine, Ser: 1.87 mg/dL — ABNORMAL HIGH (ref 0.61–1.24)
Creatinine, Ser: 1.82 mg/dL — ABNORMAL HIGH (ref 0.61–1.24)
GFR calc Af Amer: 45 mL/min — ABNORMAL LOW (ref >60)
GFR calc Af Amer: 47 mL/min — ABNORMAL LOW (ref >60)
GFR calc non Af Amer: 39 mL/min — ABNORMAL LOW (ref >60)
GFR calc non Af Amer: 40 mL/min — ABNORMAL LOW (ref >60)
Glucose, Bld: 121 mg/dL — ABNORMAL HIGH (ref 70–99)
Glucose, Bld: 125 mg/dL — ABNORMAL HIGH (ref 70–99)
Potassium: 4 mmol/L (ref 3.5–5.1)
Potassium: 3.9 mmol/L (ref 3.5–5.1)
Sodium: 142 mmol/L (ref 135–145)
Sodium: 142 mmol/L (ref 135–145)

## 2018-11-30 LAB — HEPATIC FUNCTION PANEL
ALT: 21 U/L (ref 0–44)
AST: 22 U/L (ref 15–41)
Albumin: 2.9 g/dL — ABNORMAL LOW (ref 3.5–5.0)
Alkaline Phosphatase: 71 U/L (ref 38–126)
BILIRUBIN INDIRECT: 1.1 mg/dL — AB (ref 0.3–0.9)
Bilirubin, Direct: 0.5 mg/dL — ABNORMAL HIGH (ref 0.0–0.2)
Total Bilirubin: 1.6 mg/dL — ABNORMAL HIGH (ref 0.3–1.2)
Total Protein: 6 g/dL — ABNORMAL LOW (ref 6.5–8.1)

## 2018-11-30 LAB — COOXEMETRY PANEL
Carboxyhemoglobin: 1.2 % (ref 0.5–1.5)
Methemoglobin: 1.6 % — ABNORMAL HIGH (ref 0.0–1.5)
O2 Saturation: 64.8 %
Total hemoglobin: 12.4 g/dL (ref 12.0–16.0)

## 2018-11-30 LAB — CBC
HEMATOCRIT: 40.5 % (ref 39.0–52.0)
Hemoglobin: 12.5 g/dL — ABNORMAL LOW (ref 13.0–17.0)
MCH: 26.4 pg (ref 26.0–34.0)
MCHC: 30.9 g/dL (ref 30.0–36.0)
MCV: 85.6 fL (ref 80.0–100.0)
Platelets: 195 10*3/uL (ref 150–400)
RBC: 4.73 MIL/uL (ref 4.22–5.81)
RDW: 14.6 % (ref 11.5–15.5)
WBC: 15.6 10*3/uL — ABNORMAL HIGH (ref 4.0–10.5)
nRBC: 0 % (ref 0.0–0.2)

## 2018-11-30 LAB — PHOSPHORUS
Phosphorus: 3.4 mg/dL (ref 2.5–4.6)
Phosphorus: 2.4 mg/dL — ABNORMAL LOW (ref 2.5–4.6)

## 2018-11-30 LAB — BLOOD GAS, ARTERIAL
Acid-Base Excess: 0.1 mmol/L (ref 0.0–2.0)
Bicarbonate: 23.3 mmol/L (ref 20.0–28.0)
FIO2: 80
MECHVT: 620 mL
O2 Saturation: 97.4 %
PATIENT TEMPERATURE: 100.8
PEEP/CPAP: 12 cmH2O
RATE: 25 resp/min
pCO2 arterial: 33.4 mmHg (ref 32.0–48.0)
pH, Arterial: 7.463 — ABNORMAL HIGH (ref 7.350–7.450)
pO2, Arterial: 98.9 mmHg (ref 83.0–108.0)

## 2018-11-30 LAB — HEMOGLOBIN AND HEMATOCRIT, BLOOD
HCT: 38.2 % — ABNORMAL LOW (ref 39.0–52.0)
Hemoglobin: 12.2 g/dL — ABNORMAL LOW (ref 13.0–17.0)

## 2018-11-30 LAB — MAGNESIUM
Magnesium: 2.1 mg/dL (ref 1.7–2.4)
Magnesium: 2 mg/dL (ref 1.7–2.4)

## 2018-11-30 LAB — GLUCOSE, CAPILLARY
GLUCOSE-CAPILLARY: 108 mg/dL — AB (ref 70–99)
GLUCOSE-CAPILLARY: 101 mg/dL — AB (ref 70–99)
GLUCOSE-CAPILLARY: 139 mg/dL — AB (ref 70–99)
Glucose-Capillary: 119 mg/dL — ABNORMAL HIGH (ref 70–99)
Glucose-Capillary: 122 mg/dL — ABNORMAL HIGH (ref 70–99)
Glucose-Capillary: 134 mg/dL — ABNORMAL HIGH (ref 70–99)

## 2018-11-30 LAB — TROPONIN I
TROPONIN I: 0.28 ng/mL — AB (ref <0.03)
Troponin I: 0.25 ng/mL (ref <0.03)

## 2018-11-30 LAB — HIV ANTIBODY (ROUTINE TESTING W REFLEX): HIV Screen 4th Generation wRfx: NONREACTIVE

## 2018-11-30 LAB — PROCALCITONIN: Procalcitonin: 6.59 ng/mL

## 2018-11-30 MED ORDER — VITAL HIGH PROTEIN PO LIQD: 1000.0000 mL | ORAL | Status: DC

## 2018-11-30 MED ORDER — FUROSEMIDE 10 MG/ML IJ SOLN
80.0000 mg | Freq: Two times a day (BID) | INTRAMUSCULAR | Status: AC
  Administered 2018-11-30 – 2018-12-01 (×3): 80 mg via INTRAVENOUS
  Filled 2018-11-30 (×4): qty 8

## 2018-11-30 MED ORDER — PRO-STAT SUGAR FREE PO LIQD
30.0000 mL | Freq: Two times a day (BID) | ORAL | Status: DC
  Filled 2018-11-30: qty 30

## 2018-11-30 MED ORDER — ASPIRIN 325 MG PO TABS
325.0000 mg | ORAL_TABLET | Freq: Every day | ORAL | Status: DC
  Administered 2018-11-30 – 2018-12-04 (×5): 325 mg
  Filled 2018-11-30 (×6): qty 1

## 2018-11-30 MED ORDER — PRO-STAT SUGAR FREE PO LIQD
30.0000 mL | Freq: Three times a day (TID) | ORAL | Status: DC
  Administered 2018-11-30 – 2018-12-04 (×13): 30 mL
  Filled 2018-11-30 (×12): qty 30

## 2018-11-30 MED ORDER — VITAL HIGH PROTEIN PO LIQD
1000.0000 mL | ORAL | Status: DC
  Administered 2018-11-30 – 2018-12-03 (×3): 1000 mL

## 2018-11-30 NOTE — Progress Notes (Signed)
Nutrition Follow-up  DOCUMENTATION CODES:   Obesity unspecified  INTERVENTION:  - Will order Vital High Protein @ 55 mL/hr and 30 mg Prostat TID. This regimen will provide 1620 kcal, 160 grams of protein, and 1103 mL free water.  - Free water flush per CCM.    NUTRITION DIAGNOSIS:   Inadequate oral intake related to inability to eat as evidenced by NPO status. -ongoing  GOAL:   Patient will meet greater than or equal to 90% of their needs -unmet at this time  MONITOR:   Diet advancement, Vent status, TF tolerance, Weight trends, Labs, I & O's  REASON FOR ASSESSMENT:   Consult Enteral/tube feeding initiation and management  ASSESSMENT:   Patient with PMH significant for atrial flutter, bicuspid aortic valve with stenosis s/p AVR, HTN, CHF, DM, and CKD III.  Presents this admission as code STEMI (canceled upon arrival). Intubated in ED due to respiratory failure likely secondary to CHF exacerbation.   Weight +5.1 kg/11 lb from 12/13-12/14; maintain estimate needs from yesterday. Patient awake on the vent and able to communicate through head nods and writing. OGT in place.    Patient is currently intubated on ventilator support MV: 14.6 L/min Temp (24hrs), Avg:100.6 F (38.1 C), Min:99.7 F (37.6 C), Max:101.3 F (38.5 C) BP: 133/88 and MAP: 85  Medications reviewed; 80 mg IV Lasix BID, sliding scale Novolog.  Labs reviewed; CBGs: 119, 122, and 108 mg/dL.  Drips; fentanyl @ 150 mcg/hr.    Diet Order:   Diet Order            Diet NPO time specified  Diet effective now              EDUCATION NEEDS:   Not appropriate for education at this time  Skin:  Skin Assessment: Reviewed RN Assessment  Last BM:  12/13  Height:   Ht Readings from Last 1 Encounters:  11/29/18 5\' 11"  (1.803 m)    Weight:   Wt Readings from Last 1 Encounters:  11/30/18 120.8 kg    Ideal Body Weight:  78.2 kg  BMI:  Body mass index is 37.14 kg/m.  Estimated Nutritional  Needs:   Kcal:  1273-1620 kcal  Protein:  156-166 grams  Fluid:  >/= 1.5 L/day     Jarome Matin, MS, RD, LDN, [facility] Inpatient Clinical Dietitian Pager # [phone] After hours/weekend pager # [phone]

## 2018-11-30 NOTE — Progress Notes (Signed)
NAME:  [name], MRN:  [id number], DOB:  1961-11-01, LOS: 1 ADMISSION DATE:  11/29/2018, CONSULTATION DATE: 11/29/2018 REFERRING MD: ED, CHIEF COMPLAINT: Respiratory distress  HPI/course in hospital  57yo male with PMH of atrial flutter, bicuspid aortic valve with stenosis s/p AVR, HTN, sCHF, CKD presenting to [facility] via EMS as code STEMI. Developed acute shortness of breath while driving his car.  Rapidly progressed to intubation on arrival.  Initially thought to have had STEMI but comparison of previous ECGs did not support this.  Antibiotics started for presumed community-acquired pneumonia.  Past Medical History  He,  has a past medical history of Aortic stenosis (11/19/2014), Atrial flutter ([place]), Benign essential HTN (11/19/2014), Benign hypertensive heart and renal disease, CHF (congestive heart failure) ([place]), Chronic systolic CHF (congestive heart failure) ([place]) (11/19/2014), Congenital insufficiency of aortic valve, Congestive cardiomyopathy ([place]), Congestive dilated cardiomyopathy ([place]) (11/19/2014), Essential hypertension, malignant, Insomnia, Murmur, and SOB (shortness of breath).  Past Surgical History:  Procedure Laterality Date  . AORTIC VALVE REPLACEMENT N/A 04/05/2015   Procedure: AORTIC VALVE REPLACEMENT (AVR) using a 81mm Edwards Aortic Magna Ease Valve ;  Surgeon: [name], MD;  Location: [place];  Service: Open Heart Surgery;  Laterality: N/A;  . LEFT AND RIGHT HEART CATHETERIZATION WITH CORONARY ANGIOGRAM N/A 11/27/2014   Procedure: LEFT AND RIGHT HEART CATHETERIZATION WITH CORONARY ANGIOGRAM;  Surgeon: [name], MD;  Location: [facility] CATH LAB;  Service: Cardiovascular;  Laterality: N/A;  . MAZE N/A 04/05/2015   Procedure: MAZE;  Surgeon: [name], MD;  Location: [place];  Service: Open Heart Surgery;  Laterality: N/A;  . REPLACEMENT ASCENDING AORTA N/A 04/05/2015   Procedure: REPLACEMENT ASCENDING AORTA with a 58mm Hemashield Platinum Graft;  Surgeon: [name], MD;  Location: [place];  Service: Open Heart Surgery;  Laterality: N/A;  . TEE WITHOUT CARDIOVERSION N/A 04/05/2015   Procedure: TRANSESOPHAGEAL ECHOCARDIOGRAM (TEE);  Surgeon: [name], MD;  Location: [place];  Service: Open Heart Surgery;  Laterality: N/A;      Interim history/subjective:  Patient is awake and following commands and communicating.  Denies pain or other discomfort.  Objective   Blood pressure 131/77, pulse (!) 56, temperature (!) 100.6 F (38.1 C), resp. rate (!) 25, height 5\' 11"  (1.803 m), weight 120.8 kg, SpO2 99 %. CVP:  [6 mmHg-17 mmHg] 15 mmHg  Vent Mode: PRVC FiO2 (%):  [70 %-100 %] 70 % Set Rate:  [25 bmp] 25 bmp Vt Set:  [620 mL] 620 mL PEEP:  [12 cmH20] 12 cmH20 Plateau Pressure:  [29 cmH20-34 cmH20] 29 cmH20   Intake/Output Summary (Last 24 hours) at 11/30/2018 1555 Last data filed at 11/30/2018 1400 Gross per 24 hour  Intake 1126.24 ml  Output 1635 ml  Net -508.76 ml   Filed Weights   11/29/18 0900 11/30/18 0500  Weight: 115.7 kg 120.8 kg   CVP:  [6 mmHg-17 mmHg] 15 mmHg   Examination: Physical Exam  Constitutional: He is oriented to person, place, and time. He appears well-developed and well-nourished.  HENT:  Head: Normocephalic and atraumatic.  Eyes: Pupils are equal, round, and reactive to light.  Neck: Neck supple. JVD present.  Cardiovascular: Normal rate, regular rhythm and normal heart sounds.  Respiratory: No respiratory distress. He has no wheezes. He has no rales.  GI: Soft.  Neurological: He is alert and oriented to person, place, and time.  Skin: Skin is warm and dry.     Ancillary tests (personally reviewed)  CBC: Recent Labs  Lab 11/29/18 0807 11/30/18 0435 11/30/18 1019  WBC 19.7* 15.6*  --   NEUTROABS 15.1*  --   --   HGB 15.1 12.5* 12.2*  HCT 50.9 40.5 38.2*  MCV 90.2 85.6  --   PLT 195 195  --     Basic Metabolic Panel: Recent Labs  Lab 11/29/18 0807 11/29/18 1248 11/30/18 0435  NA 140   --  142  K 4.2  --  4.0  CL 102  --  106  CO2 19*  --  24  GLUCOSE 271*  --  121*  BUN 15  --  21*  CREATININE 1.86*  --  1.87*  CALCIUM 9.2  --  8.4*  MG  --  2.1 2.1  PHOS  --  5.4* 3.4   GFR: Estimated Creatinine Clearance: 57.6 mL/min (A) (by C-G formula based on SCr of 1.87 mg/dL (H)). Recent Labs  Lab 11/29/18 0807 11/29/18 1248 11/29/18 1249 11/30/18 0435  PROCALCITON  --   --  3.11 6.59  WBC 19.7*  --   --  15.6*  LATICACIDVEN  --  1.6  --   --     Liver Function Tests: Recent Labs  Lab 11/29/18 0807 11/30/18 0435  AST 44* 22  ALT 25 21  ALKPHOS 99 71  BILITOT 1.5* 1.6*  PROT 8.5* 6.0*  ALBUMIN 4.0 2.9*   No results for input(s): LIPASE, AMYLASE in the last 168 hours. No results for input(s): AMMONIA in the last 168 hours.  ABG    Component Value Date/Time   PHART 7.463 (H) 11/30/2018 0435   PCO2ART 33.4 11/30/2018 0435   PO2ART 98.9 11/30/2018 0435   HCO3 23.3 11/30/2018 0435   TCO2 24 11/29/2018 1313   ACIDBASEDEF 3.0 (H) 11/29/2018 1313   O2SAT 64.8 11/30/2018 1538     Coagulation Profile: No results for input(s): INR, PROTIME in the last 168 hours.  Cardiac Enzymes: Recent Labs  Lab 11/29/18 0807 11/29/18 1249 11/29/18 1643 11/29/18 2303 11/30/18 1019  TROPONINI 0.15* 0.25* 0.24* 0.25* 0.28*    HbA1C: Hgb A1c MFr Bld  Date/Time Value Ref Range Status  03/29/2015 05:19 AM 6.9 (H) 4.8 - 5.6 % Final    Comment:    (NOTE)         Pre-diabetes: 5.7 - 6.4         Diabetes: >6.4         Glycemic control for adults with diabetes: <7.0     CBG: Recent Labs  Lab 11/29/18 2323 11/30/18 0334 11/30/18 0739 11/30/18 1131 11/30/18 1546  GLUCAP 122* 119* 122* 108* 134*     Assessment & Plan:  Critically ill due to acute respiratory failure with hypoxia and hypercarbia requiring mechanical ventilation. Hypotension requiring vasopressors, likely to compensate for sedative infusions.  Now resolved. Ongoing high FiO2 and PEEP  requirement Likely etiology is decompensated heart failure based on echocardiogram findings of impaired LV systolic function in context of prior AVR.  High BNP and CVP. Cannot rule out pneumonia at this time.  PLAN:  Continue full ventilatory support.  Wean FiO2 and PEEP as tolerated to maintain adequate oxygenation. Initiate trial of spontaneous breathing once on nominal ventilator settings. Initiate diuresis with furosemide. Continue current antibiotic therapy   Best practice:  Diet: Initiate tube feed protocol Pain/Anxiety/Delirium protocol (if indicated): Intermittent sedation only VAP protocol (if indicated): In place DVT prophylaxis: Lovenox GI prophylaxis: Protonix Glucose control: Adequate control with subcu insulin Mobility: Bedrest Code Status:  Full Family Communication: Family present Disposition: ICU   Critical care time: 40 min spent evaluating patient and reviewing hemodynamics and other cardiac ancillary data.  Titrating mechanical ventilation and assessing suitability for weaning.    Kipp Brood, MD [facility] ICU Physician [place]  Pager: [phone] Mobile: [phone] After hours: [phone].  11/30/2018, 3:55 PM

## 2018-11-30 NOTE — Progress Notes (Signed)
Progress Note  Patient Name: [name] Date of Encounter: 11/30/2018  Primary Cardiologist: [name], MD   Subjective   ET tube in place, writing down questions.  Family in room.  Stating that his left leg has been experiencing numbness for a few years.  He has been coughing up phlegm and has been short of breath with activity over the past few months.  Inpatient Medications    Scheduled Meds: . aspirin  325 mg Oral Daily  . chlorhexidine gluconate (MEDLINE KIT)  15 mL Mouth Rinse BID  . Chlorhexidine Gluconate Cloth  6 each Topical Daily  . enoxaparin (LOVENOX) injection  40 mg Subcutaneous Q24H  . insulin aspart  0-15 Units Subcutaneous Q4H  . mouth rinse  15 mL Mouth Rinse 10 times per day  . mupirocin ointment  1 application Nasal BID  . pantoprazole (PROTONIX) IV  40 mg Intravenous QHS  . sodium bicarbonate  25 mEq Intravenous Once  . sodium chloride flush  10-40 mL Intracatheter Q12H   Continuous Infusions: . sodium chloride 10 mL/hr at 11/30/18 0700  . sodium chloride    . azithromycin    . cefTRIAXone (ROCEPHIN)  IV    . fentaNYL infusion INTRAVENOUS 125 mcg/hr (11/30/18 0700)  . nitroGLYCERIN Stopped (11/29/18 0949)  . norepinephrine (LEVOPHED) Adult infusion 2 mcg/min (11/30/18 0700)  . phenylephrine (NEO-SYNEPHRINE) Adult infusion    . propofol (DIPRIVAN) infusion Stopped (11/29/18 1240)   PRN Meds: Place/Maintain arterial line **AND** sodium chloride, Place/Maintain arterial line **AND** sodium chloride, acetaminophen, fentaNYL, sodium chloride flush   Vital Signs    Vitals:   11/30/18 0630 11/30/18 0645 11/30/18 0700 11/30/18 0814  BP:   125/78 123/68  Pulse: (!) 51 (!) 53 (!) 49 (!) 56  Resp: (!) 25 (!) 25 (!) 25 (!) 25  Temp: (!) 100.8 F (38.2 C) (!) 100.8 F (38.2 C) (!) 100.8 F (38.2 C)   TempSrc:      SpO2: 93% 95% 94% 97%  Weight:      Height:        Intake/Output Summary (Last 24 hours) at 11/30/2018 0935 Last data filed  at 11/30/2018 0700 Gross per 24 hour  Intake 1382.58 ml  Output 880 ml  Net 502.58 ml   Filed Weights   11/29/18 0900 11/30/18 0500  Weight: 115.7 kg 120.8 kg    Telemetry    P waves are still present and occasionally appear to conduct however there still appears to be some evidence of A-V dissociation, no significant pauses- Personally Reviewed  ECG    Right bundle branch block with P waves noted, A-V dissociation- Personally Reviewed  Physical Exam   GEN: No acute distress.  Laying in bed Neck: No JVD, ET tube in place Cardiac:  Irregular, normal rate, no murmurs, rubs, or gallops.  Respiratory:  Vent noise bilaterally  GI: Soft, nontender, non-distended, obese MS: No edema; No deformity. Neuro:  Nonfocal  Psych: Normal affect   Labs    Chemistry Recent Labs  Lab 11/29/18 0807 11/30/18 0435  NA 140 142  K 4.2 4.0  CL 102 106  CO2 19* 24  GLUCOSE 271* 121*  BUN 15 21*  CREATININE 1.86* 1.87*  CALCIUM 9.2 8.4*  PROT 8.5* 6.0*  ALBUMIN 4.0 2.9*  AST 44* 22  ALT 25 21  ALKPHOS 99 71  BILITOT 1.5* 1.6*  GFRNONAA 39* 39*  GFRAA 46* 45*  ANIONGAP 19* 12     Hematology Recent Labs  Lab 11/29/18 0807 11/30/18 0435  WBC 19.7* 15.6*  RBC 5.64 4.73  HGB 15.1 12.5*  HCT 50.9 40.5  MCV 90.2 85.6  MCH 26.8 26.4  MCHC 29.7* 30.9  RDW 14.7 14.6  PLT 195 195    Cardiac Enzymes Recent Labs  Lab 11/29/18 0807 11/29/18 1249 11/29/18 1643 11/29/18 2303  TROPONINI 0.15* 0.25* 0.24* 0.25*   No results for input(s): TROPIPOC in the last 168 hours.   BNP Recent Labs  Lab 11/29/18 0808  BNP 1,232.9*     DDimer No results for input(s): DDIMER in the last 168 hours.   Radiology    Dg Chest Port 1 View  Result Date: 11/29/2018 CLINICAL DATA:  Initial evaluation for central line placement. EXAM: PORTABLE CHEST 1 VIEW COMPARISON:  Prior radiograph from earlier the same day. FINDINGS: Patient remains intubated with the tip of an endotracheal tube  positioned well above the carina. Enteric tube courses into the abdomen. Interval placement of a right IJ approach centra venous catheter with tip seen overlying the distal SVC. Prominent cardiomegaly, unchanged. Mediastinal silhouette stable, and remains within normal limits. Lungs normally inflated. Persistent fairly extensive bilateral airspace opacities, right worse than left, slightly improved from previous, suspected to reflect a degree of improving edema given rapid change. Infiltrates could also be considered. No appreciable pleural effusion. No pneumothorax. Osseous structures unchanged. IMPRESSION: 1. Interval placement of right IJ approach central venous catheter with tip overlying the distal SVC. Remaining support apparatus in satisfactory position. 2. Interval improvement in extensive bilateral airspace opacities, right greater than left, suspected to reflect improved edema given fairly rapid change from previous. Again, multifocal infiltrates could also be considered. Electronically Signed   By: Jeannine Boga M.D.   On: 11/29/2018 13:43   Dg Chest Port 1 View  Result Date: 11/29/2018 CLINICAL DATA:  ETT placement EXAM: PORTABLE CHEST 1 VIEW COMPARISON:  November 29, 2017 FINDINGS: An ET tube is been placed, terminating in the mid trachea, in good position. No pneumothorax. Diffuse infiltrate throughout the right lung, worsened in the interval. Increasing opacity throughout the left lung, worsened in the interval. No other changes. IMPRESSION: 1. The ETT is in good position terminating in the mid trachea. 2. Worsening bilateral pulmonary infiltrates, right greater than left. This could represent severe worsening edema versus is a multifocal infectious process. Recommend clinical correlation and follow-up to resolution. Electronically Signed   By: Dorise Bullion III M.D   On: 11/29/2018 09:41   Dg Chest Port 1 View  Result Date: 11/29/2018 CLINICAL DATA:  Respiratory distress and  hypoxia EXAM: PORTABLE CHEST 1 VIEW COMPARISON:  04/23/2015 FINDINGS: Cardiac shadow remains enlarged. Postsurgical changes are again seen. Right PICC line has been removed in the interval. The lungs are well aerated bilaterally. Diffuse right-sided parenchymal opacity is noted likely related to asymmetric edema or underlying pneumonic infiltrate. No sizable effusion is seen. The left lung is clear. No bony abnormality is noted. IMPRESSION: Diffuse increased opacity throughout the right lung likely related to asymmetric edema. Electronically Signed   By: Inez Catalina M.D.   On: 11/29/2018 08:24   Dg Abd Portable 1 View  Result Date: 11/29/2018 CLINICAL DATA:  Evaluate OG tube EXAM: PORTABLE ABDOMEN - 1 VIEW COMPARISON:  None. FINDINGS: The OG tube terminates in the left upper quadrant, within the stomach. IMPRESSION: The enteric tube terminates in the left upper quadrant of the abdomen, in the stomach. Electronically Signed   By: Dorise Bullion III M.D   On:  11/29/2018 09:42    Cardiac Studies   Echocardiogram 11/29/2018: - Left ventricle: The cavity size was normal. Wall thickness was   increased in a pattern of severe LVH. Systolic function was   normal. The estimated ejection fraction was in the range of 50%   to 55%  Mild hypokinesis of the anteroseptal myocardium. Doppler   parameters are consistent with abnormal left ventricular   relaxation (grade 1 diastolic dysfunction). - Aortic valve: A prosthesis was present and functioning normally.   The prosthesis had a normal range of motion. The sewing ring   appeared normal, had no rocking motion, and showed no evidence of   dehiscence. There was mild regurgitation. Mean gradient (S): 10   mm Hg. Valve area (VTI): 1.69 cm^2. Valve area (Vmax): 1.63 cm^2.   Valve area (Vmean): 1.61 cm^2. - Left atrium: The atrium was severely dilated. - Right ventricle: The cavity size was mildly dilated. Wall   thickness was normal. Systolic function was  moderately reduced. - Right atrium: The atrium was moderately to severely dilated. - Atrial septum: The septum bowed from right to left, consistent   with increased right atrial pressure. - Pulmonary arteries: Systolic pressure was moderately to severely   increased. PA peak pressure: 56 mm Hg (S).  Impressions:  - Severe LVH. Moderate to severe pulmonary hypertension. Aortic   valve stable with mild regurgitation.  Patient Profile     57 y.o. male with current ejection fraction 50% history of combined systolic and diastolic heart failure paroxysmal atrial fibrillation flutter aortic regurgitation with bioprosthetic aortic valve replacement ascending aorta and maze procedure on 04/05/2015 lost to follow-up.  Normal coronary arteries prior to aortic valve replacement.  Here with acute respiratory failure.  Assessment & Plan    Acute hypoxemic respiratory failure - Likely combination of acute heart failure as well as possible pneumonia, fever, white count, antibiotics  Aortic valve replacement - Appears normal.  Acute systolic/diastolic heart failure - EF currently reported 50 to 55%, but upon personal visual inspection appears to be more in the 30 to 35% range.  BNP 1200.  - CVP ranging from 7-15 - Severely dilated left atrium. - I think he may need Lasix but we will hold off until fluid status is better delineated, co-ox is back.  Acute kidney injury -Creatinine stable at 1.8  Elevated troponin -Demand ischemia in the setting of underlying hypoxic respiratory failure.  Flat, low level 0.24.  Septic shock -EF is appears low on personal inspection of echocardiogram.  I will check a co-ox. -Continue with pressor/inotropic agents, gently weaning.  Morbid obesity -BMI 37 with 2 or more comorbidities.  Continue to encourage weight loss.  Secondary pulmonary hypertension -From morbid obesity, and combination of left-sided heart disease  Critical care time 35 minutes spent with  patient, data review in this gentleman with multisystem organ failure, cardiac, respiratory, fever, kidney injury, prior aortic valve replacement.  Discussion with nursing care team as well as family.     For questions or updates, please contact [place] Please consult [email or web address] for contact info under        Signed, Candee Furbish, MD  11/30/2018, 9:35 AM

## 2018-11-30 NOTE — Progress Notes (Signed)
eLink Physician-Brief Progress Note Patient Name: [name] DOB: September 12, 1961 MRN: [id number]   Date of Service  11/30/2018  HPI/Events of Note  Notified of coffee ground OG output. No changes in hemodynamics. Slight decrease in H/H.  eICU Interventions  Will monitor H/H, include cardiac enzymes and slightly trended up earlier. Will defer to bedside team if aspirin and enoxaparin need to be held.  Discussed with bedside nurse.     Intervention Category Major Interventions: Hemorrhage - evaluation and management  Judd Lien 11/30/2018, 6:44 AM

## 2018-12-01 DIAGNOSIS — I5023 Acute on chronic systolic (congestive) heart failure: Secondary | ICD-10-CM

## 2018-12-01 DIAGNOSIS — J9600 Acute respiratory failure, unspecified whether with hypoxia or hypercapnia: Secondary | ICD-10-CM

## 2018-12-01 LAB — BASIC METABOLIC PANEL
Anion gap: 13 (ref 5–15)
Anion gap: 11 (ref 5–15)
BUN: 32 mg/dL — ABNORMAL HIGH (ref 6–20)
BUN: 33 mg/dL — ABNORMAL HIGH (ref 6–20)
CO2: 25 mmol/L (ref 22–32)
CO2: 25 mmol/L (ref 22–32)
CREATININE: 1.65 mg/dL — AB (ref 0.61–1.24)
Calcium: 8.7 mg/dL — ABNORMAL LOW (ref 8.9–10.3)
Calcium: 8.6 mg/dL — ABNORMAL LOW (ref 8.9–10.3)
Chloride: 104 mmol/L (ref 98–111)
Chloride: 106 mmol/L (ref 98–111)
Creatinine, Ser: 1.54 mg/dL — ABNORMAL HIGH (ref 0.61–1.24)
GFR calc Af Amer: 53 mL/min — ABNORMAL LOW (ref >60)
GFR calc Af Amer: 57 mL/min — ABNORMAL LOW (ref >60)
GFR calc non Af Amer: 45 mL/min — ABNORMAL LOW (ref >60)
GFR calc non Af Amer: 49 mL/min — ABNORMAL LOW (ref >60)
Glucose, Bld: 122 mg/dL — ABNORMAL HIGH (ref 70–99)
Glucose, Bld: 125 mg/dL — ABNORMAL HIGH (ref 70–99)
Potassium: 3.7 mmol/L (ref 3.5–5.1)
Potassium: 3.3 mmol/L — ABNORMAL LOW (ref 3.5–5.1)
Sodium: 142 mmol/L (ref 135–145)
Sodium: 142 mmol/L (ref 135–145)

## 2018-12-01 LAB — GLUCOSE, CAPILLARY
Glucose-Capillary: 117 mg/dL — ABNORMAL HIGH (ref 70–99)
Glucose-Capillary: 131 mg/dL — ABNORMAL HIGH (ref 70–99)
Glucose-Capillary: 135 mg/dL — ABNORMAL HIGH (ref 70–99)
Glucose-Capillary: 145 mg/dL — ABNORMAL HIGH (ref 70–99)

## 2018-12-01 LAB — PHOSPHORUS
Phosphorus: 2.5 mg/dL (ref 2.5–4.6)
Phosphorus: 3.5 mg/dL (ref 2.5–4.6)

## 2018-12-01 LAB — PROCALCITONIN: PROCALCITONIN: 5.29 ng/mL

## 2018-12-01 LAB — LEGIONELLA PNEUMOPHILA SEROGP 1 UR AG: L. pneumophila Serogp 1 Ur Ag: NEGATIVE

## 2018-12-01 LAB — MAGNESIUM
MAGNESIUM: 2.4 mg/dL (ref 1.7–2.4)
Magnesium: 2.2 mg/dL (ref 1.7–2.4)

## 2018-12-01 MED ORDER — POTASSIUM CHLORIDE 20 MEQ/15ML (10%) PO SOLN
40.0000 meq | Freq: Once | ORAL | Status: AC
  Administered 2018-12-01: 40 meq
  Filled 2018-12-01: qty 30

## 2018-12-01 NOTE — Progress Notes (Signed)
NAME:  [name], MRN:  [id number], DOB:  [birth year], LOS: 2 ADMISSION DATE:  11/29/2018, CONSULTATION DATE: 11/29/2018 REFERRING MD: ED, CHIEF COMPLAINT: Respiratory distress  HPI/course in hospital  57yo male with PMH of atrial flutter, bicuspid aortic valve with stenosis s/p AVR, HTN, sCHF, CKD presenting to [facility] via EMS as code STEMI. Developed acute shortness of breath while driving his car.  Rapidly progressed to intubation on arrival.  Initially thought to have had STEMI but comparison of previous ECGs did not support this.  Antibiotics started for presumed community-acquired pneumonia.  Past Medical History  He,  has a past medical history of Aortic stenosis (11/19/2014), Atrial flutter ([place]), Benign essential HTN (11/19/2014), Benign hypertensive heart and renal disease, CHF (congestive heart failure) ([place]), Chronic systolic CHF (congestive heart failure) ([place]) (11/19/2014), Congenital insufficiency of aortic valve, Congestive cardiomyopathy ([place]), Congestive dilated cardiomyopathy ([place]) (11/19/2014), Essential hypertension, malignant, Insomnia, Murmur, and SOB (shortness of breath).  Past Surgical History:  Procedure Laterality Date  . AORTIC VALVE REPLACEMENT N/A 04/05/2015   Procedure: AORTIC VALVE REPLACEMENT (AVR) using a 57mm Edwards Aortic Magna Ease Valve ;  Surgeon: [name], MD;  Location: [place];  Service: Open Heart Surgery;  Laterality: N/A;  . LEFT AND RIGHT HEART CATHETERIZATION WITH CORONARY ANGIOGRAM N/A 11/27/2014   Procedure: LEFT AND RIGHT HEART CATHETERIZATION WITH CORONARY ANGIOGRAM;  Surgeon: [name], MD;  Location: [facility] CATH LAB;  Service: Cardiovascular;  Laterality: N/A;  . MAZE N/A 04/05/2015   Procedure: MAZE;  Surgeon: [name], MD;  Location: [place];  Service: Open Heart Surgery;  Laterality: N/A;  . REPLACEMENT ASCENDING AORTA N/A 04/05/2015   Procedure: REPLACEMENT ASCENDING AORTA with a 61mm Hemashield Platinum Graft;  Surgeon: [name], MD;  Location: [place];  Service: Open Heart Surgery;  Laterality: N/A;  . TEE WITHOUT CARDIOVERSION N/A 04/05/2015   Procedure: TRANSESOPHAGEAL ECHOCARDIOGRAM (TEE);  Surgeon: [name], MD;  Location: [place];  Service: Open Heart Surgery;  Laterality: N/A;      Interim history/subjective:  Patient is awake and following commands and communicating.  Denies pain or other discomfort.  Objective   Blood pressure (!) 118/58, pulse (!) 47, temperature (!) 100.4 F (38 C), resp. rate (!) 25, height 5\' 11"  (1.803 m), weight 118.7 kg, SpO2 96 %. CVP:  [11 mmHg-12 mmHg] 12 mmHg  Vent Mode: PRVC FiO2 (%):  [50 %-70 %] 50 % Set Rate:  [25 bmp] 25 bmp Vt Set:  [494 mL] 620 mL PEEP:  [12 cmH20] 12 cmH20 Plateau Pressure:  [26 cmH20-28 cmH20] 28 cmH20   Intake/Output Summary (Last 24 hours) at 12/01/2018 1503 Last data filed at 12/01/2018 1200 Gross per 24 hour  Intake 1895.78 ml  Output 3500 ml  Net -1604.22 ml   Filed Weights   11/30/18 0500 12/01/18 0600 12/01/18 0800  Weight: 120.8 kg 119 kg 118.7 kg   CVP:  [11 mmHg-12 mmHg] 12 mmHg   Examination: Physical Exam  Constitutional: He is oriented to person, place, and time. He appears well-developed and well-nourished.  HENT:  Head: Normocephalic and atraumatic.  Eyes: Pupils are equal, round, and reactive to light.  Neck: Neck supple. JVD present.  Cardiovascular: Normal rate, regular rhythm and normal heart sounds.  Respiratory: No respiratory distress. He has no wheezes. He has no rales.  GI: Soft.  Neurological: He is alert and oriented to person, place, and time.  Skin: Skin is warm and dry.  Ancillary tests (personally reviewed)  CBC: Recent Labs  Lab 11/29/18 0807 11/30/18 0435 11/30/18 1019  WBC 19.7* 15.6*  --   NEUTROABS 15.1*  --   --   HGB 15.1 12.5* 12.2*  HCT 50.9 40.5 38.2*  MCV 90.2 85.6  --   PLT 195 195  --     Basic Metabolic Panel: Recent Labs  Lab 11/29/18 0807 11/29/18 1248  11/30/18 0435 11/30/18 1749 12/01/18 0525  NA 140  --  142 142 142  K 4.2  --  4.0 3.9 3.7  CL 102  --  106 105 104  CO2 19*  --  24 26 25   GLUCOSE 271*  --  121* 125* 122*  BUN 15  --  21* 24* 32*  CREATININE 1.86*  --  1.87* 1.82* 1.65*  CALCIUM 9.2  --  8.4* 8.7* 8.7*  MG  --  2.1 2.1 2.0 2.4  PHOS  --  5.4* 3.4 2.4* 2.5   GFR: Estimated Creatinine Clearance: 64.8 mL/min (A) (by C-G formula based on SCr of 1.65 mg/dL (H)). Recent Labs  Lab 11/29/18 0807 11/29/18 1248 11/29/18 1249 11/30/18 0435 12/01/18 0525  PROCALCITON  --   --  3.11 6.59 5.29  WBC 19.7*  --   --  15.6*  --   LATICACIDVEN  --  1.6  --   --   --     Liver Function Tests: Recent Labs  Lab 11/29/18 0807 11/30/18 0435  AST 44* 22  ALT 25 21  ALKPHOS 99 71  BILITOT 1.5* 1.6*  PROT 8.5* 6.0*  ALBUMIN 4.0 2.9*   No results for input(s): LIPASE, AMYLASE in the last 168 hours. No results for input(s): AMMONIA in the last 168 hours.  ABG    Component Value Date/Time   PHART 7.463 (H) 11/30/2018 0435   PCO2ART 33.4 11/30/2018 0435   PO2ART 98.9 11/30/2018 0435   HCO3 23.3 11/30/2018 0435   TCO2 24 11/29/2018 1313   ACIDBASEDEF 3.0 (H) 11/29/2018 1313   O2SAT 64.8 11/30/2018 1538     Coagulation Profile: No results for input(s): INR, PROTIME in the last 168 hours.  Cardiac Enzymes: Recent Labs  Lab 11/29/18 0807 11/29/18 1249 11/29/18 1643 11/29/18 2303 11/30/18 1019  TROPONINI 0.15* 0.25* 0.24* 0.25* 0.28*    HbA1C: Hgb A1c MFr Bld  Date/Time Value Ref Range Status  03/29/2015 05:19 AM 6.9 (H) 4.8 - 5.6 % Final    Comment:    (NOTE)         Pre-diabetes: 5.7 - 6.4         Diabetes: >6.4         Glycemic control for adults with diabetes: <7.0     CBG: Recent Labs  Lab 11/30/18 2321 12/01/18 0032 12/01/18 0336 12/01/18 0748 12/01/18 1154  GLUCAP 139* 131* 117* 145* 135*    Assessment & Plan:  Critically ill due to acute respiratory failure with hypoxia and  hypercarbia requiring mechanical ventilation. Hypotension requiring vasopressors, likely to compensate for sedative infusions.  Now resolved. Ongoing high FiO2 and PEEP requirement Likely etiology is decompensated heart failure based on echocardiogram findings of impaired LV systolic function in context of prior AVR.  High BNP and CVP. Cannot rule out pneumonia at this time.  PLAN:  Continue full ventilatory support.  Wean FiO2 and PEEP as tolerated to maintain adequate oxygenation. Initiate trial of spontaneous breathing once on nominal ventilator settings. Initiate diuresis with furosemide. Continue current antibiotic therapy  Best practice:  Diet: Initiate tube feed protocol Pain/Anxiety/Delirium protocol (if indicated): Intermittent sedation only VAP protocol (if indicated): In place DVT prophylaxis: Lovenox GI prophylaxis: Protonix Glucose control: Adequate control with subcu insulin Mobility: Bedrest Code Status: Full Family Communication: Family present Disposition: ICU  Critical care time: 35 min spent evaluating patient and reviewing hemodynamics and other cardiac ancillary data.  Titrating mechanical ventilation and assessing suitability for weaning.    Kipp Brood, MD [facility] ICU Physician [place]  Pager: [phone] Mobile: [phone] After hours: [phone].  12/01/2018, 3:03 PM

## 2018-12-01 NOTE — Progress Notes (Signed)
Progress Note  Patient Name: [name] Date of Encounter: 12/01/2018  Primary Cardiologist: [name], MD   Subjective   Quite jovial with ventilator in place.  Suctioning.  Inpatient Medications    Scheduled Meds: . aspirin  325 mg Per Tube Daily  . chlorhexidine gluconate (MEDLINE KIT)  15 mL Mouth Rinse BID  . Chlorhexidine Gluconate Cloth  6 each Topical Daily  . enoxaparin (LOVENOX) injection  40 mg Subcutaneous Q24H  . feeding supplement (PRO-STAT SUGAR FREE 64)  30 mL Per Tube TID  . furosemide  80 mg Intravenous Q12H  . insulin aspart  0-15 Units Subcutaneous Q4H  . mouth rinse  15 mL Mouth Rinse 10 times per day  . mupirocin ointment  1 application Nasal BID  . pantoprazole (PROTONIX) IV  40 mg Intravenous QHS  . sodium bicarbonate  25 mEq Intravenous Once  . sodium chloride flush  10-40 mL Intracatheter Q12H   Continuous Infusions: . sodium chloride 10 mL/hr at 12/01/18 0600  . sodium chloride    . azithromycin Stopped (11/30/18 1351)  . cefTRIAXone (ROCEPHIN)  IV 1 g (12/01/18 1027)  . feeding supplement (VITAL HIGH PROTEIN) 1,000 mL (12/01/18 1030)  . fentaNYL infusion INTRAVENOUS 150 mcg/hr (12/01/18 0600)  . nitroGLYCERIN Stopped (11/29/18 0949)  . norepinephrine (LEVOPHED) Adult infusion Stopped (11/30/18 1101)  . propofol (DIPRIVAN) infusion Stopped (11/29/18 1240)   PRN Meds: Place/Maintain arterial line **AND** sodium chloride, Place/Maintain arterial line **AND** sodium chloride, acetaminophen, fentaNYL, sodium chloride flush   Vital Signs    Vitals:   12/01/18 0404 12/01/18 0600 12/01/18 0700 12/01/18 0755  BP: 121/71  126/82   Pulse: 63 (!) 55 (!) 52   Resp: (!) 25 (!) 25 (!) 25   Temp:  (!) 100.9 F (38.3 C) (!) 100.9 F (38.3 C)   TempSrc:      SpO2: 94% 98% 99% 99%  Weight:  119 kg    Height:        Intake/Output Summary (Last 24 hours) at 12/01/2018 1045 Last data filed at 12/01/2018 0600 Gross per 24 hour  Intake  1160.59 ml  Output 3545 ml  Net -2384.41 ml   Filed Weights   11/29/18 0900 11/30/18 0500 12/01/18 0600  Weight: 115.7 kg 120.8 kg 119 kg    Telemetry    No adverse arrhythmias- Personally Reviewed  ECG    Normal sinus rhythm- Personally Reviewed  Physical Exam   GEN: No acute distress.  ET tube in place Neck:  Difficult to assess JVD, CVP was 15 Cardiac: RRR, no murmurs, rubs, or gallops.  Respiratory:  Rales bilaterally. GI: Soft, nontender, non-distended  MS: No edema; No deformity. Neuro:  Nonfocal  Psych: Normal affect   Labs    Chemistry Recent Labs  Lab 11/29/18 0807 11/30/18 0435 11/30/18 1749 12/01/18 0525  NA 140 142 142 142  K 4.2 4.0 3.9 3.7  CL 102 106 105 104  CO2 19* _0 GLUCOSE 271* 121* 125* 122*  BUN 15 21* 24* 32*  CREATININE 1.86* 1.87* 1.82* 1.65*  CALCIUM 9.2 8.4* 8.7* 8.7*  PROT 8.5* 6.0*  --   --   ALBUMIN 4.0 2.9*  --   --   AST 44* 22  --   --   ALT 25 21  --   --   ALKPHOS 99 71  --   --   BILITOT 1.5* 1.6*  --   --   GFRNONAA 39* 39* 40*  45*  GFRAA 46* 45* 47* 53*  ANIONGAP 19* _0 Hematology Recent Labs  Lab 11/29/18 0807 11/30/18 0435 11/30/18 1019  WBC 19.7* 15.6*  --   RBC 5.64 4.73  --   HGB 15.1 12.5* 12.2*  HCT 50.9 40.5 38.2*  MCV 90.2 85.6  --   MCH 26.8 26.4  --   MCHC 29.7* 30.9  --   RDW 14.7 14.6  --   PLT 195 195  --     Cardiac Enzymes Recent Labs  Lab 11/29/18 1249 11/29/18 1643 11/29/18 2303 11/30/18 1019  TROPONINI 0.25* 0.24* 0.25* 0.28*   No results for input(s): TROPIPOC in the last 168 hours.   BNP Recent Labs  Lab 11/29/18 0808  BNP 1,232.9*     DDimer No results for input(s): DDIMER in the last 168 hours.   Radiology    Dg Chest Port 1 View  Result Date: 11/30/2018 CLINICAL DATA:  Respiratory failure EXAM: PORTABLE CHEST 1 VIEW COMPARISON:  November 29, 2018 FINDINGS: The ETT terminates 6 cm below the thoracic inlet, in good position. The right central  line is stable. The enteric tube terminates below today's film. No pneumothorax. Bilateral pulmonary infiltrates continue to improve. Cardiomegaly. Focal opacity in left base may represent atelectasis. A small effusions not excluded. No other acute abnormalities. IMPRESSION: 1. Support apparatus as above. 2. The bilateral infiltrates continue to improve. 3. Focal opacity in the left retrocardiac region may represent atelectasis. Electronically Signed   By: Dorise Bullion III M.D   On: 11/30/2018 10:03   Dg Chest Port 1 View  Result Date: 11/29/2018 CLINICAL DATA:  Initial evaluation for central line placement. EXAM: PORTABLE CHEST 1 VIEW COMPARISON:  Prior radiograph from earlier the same day. FINDINGS: Patient remains intubated with the tip of an endotracheal tube positioned well above the carina. Enteric tube courses into the abdomen. Interval placement of a right IJ approach centra venous catheter with tip seen overlying the distal SVC. Prominent cardiomegaly, unchanged. Mediastinal silhouette stable, and remains within normal limits. Lungs normally inflated. Persistent fairly extensive bilateral airspace opacities, right worse than left, slightly improved from previous, suspected to reflect a degree of improving edema given rapid change. Infiltrates could also be considered. No appreciable pleural effusion. No pneumothorax. Osseous structures unchanged. IMPRESSION: 1. Interval placement of right IJ approach central venous catheter with tip overlying the distal SVC. Remaining support apparatus in satisfactory position. 2. Interval improvement in extensive bilateral airspace opacities, right greater than left, suspected to reflect improved edema given fairly rapid change from previous. Again, multifocal infiltrates could also be considered. Electronically Signed   By: Jeannine Boga M.D.   On: 11/29/2018 13:43    Cardiac Studies   Echo personally reviewed demonstrates EF which I think is more in  the 35% range.  Similar to prior.  Patient Profile     57 y.o. male with bioprosthetic aortic valve, morbid obesity, possible pneumonia, reduced ejection fraction with acute on chronic systolic heart failure, acute hypoxic respiratory failure requiring mechanical support.  Original EKG concerning for STEMI but prior EKG showed a similar finding.  Assessment & Plan    Acute hypoxic respiratory failure/acute on chronic systolic heart failure - Agree with Dr. Lynetta Mare that he is likely volume overloaded.  CVP this morning was 15.  Agree with initiating diuresis. - Co-ox performed yesterday was 64.8%.  Reasonable estimation of cardiac output. -Continue to monitor renal function - Clinically, he has been having ongoing  sputum production over the past few months.  More shortness of breath with stairs for instance.  Acute kidney injury, creatinine improved to 1.5 from 1.8.  Morbid obesity -Continue to encourage weight loss  Possible pneumonia -Continuing to treat.  Procalcitonin was elevated 5.2.  Shock - Possible combination of cardiogenic and septic.  Co-ox 68%.  Starting to decrease pressors.  Excellent.  Critical care time 35 minutes spent with patient review of data, review of critical care team assessments, nursing in this gentleman with multisystem organ failure.      For questions or updates, please contact [place] Please consult [email or web address] for contact info under        Signed, Candee Furbish, MD  12/01/2018, 10:45 AM

## 2018-12-01 NOTE — Progress Notes (Signed)
eLink Physician-Brief Progress Note Patient Name: [name] DOB: Apr 30, 1961 MRN: [id number]   Date of Service  12/01/2018  HPI/Events of Note  K+ = 3.3 and Creatinine = 1.54.  eICU Interventions  Will replace K+.      Intervention Category Major Interventions: Electrolyte abnormality - evaluation and management  Sommer,Steven Eugene 12/01/2018, 7:38 PM

## 2018-12-01 NOTE — Progress Notes (Addendum)
Dr. Oletta Darter paged to advise of K=3.3.  Return page from Laurie with [place] who advised she would notify Dr. Oletta Darter.  Awaiting call back.[name]

## 2018-12-01 NOTE — Progress Notes (Signed)
New orders received for potassium replacement from Dr. Oletta Darter.

## 2018-12-02 DIAGNOSIS — I5033 Acute on chronic diastolic (congestive) heart failure: Secondary | ICD-10-CM

## 2018-12-02 DIAGNOSIS — J9602 Acute respiratory failure with hypercapnia: Secondary | ICD-10-CM

## 2018-12-02 LAB — BASIC METABOLIC PANEL
Anion gap: 12 (ref 5–15)
BUN: 36 mg/dL — ABNORMAL HIGH (ref 6–20)
CALCIUM: 8.8 mg/dL — AB (ref 8.9–10.3)
CO2: 26 mmol/L (ref 22–32)
Chloride: 108 mmol/L (ref 98–111)
Creatinine, Ser: 1.65 mg/dL — ABNORMAL HIGH (ref 0.61–1.24)
GFR calc Af Amer: 53 mL/min — ABNORMAL LOW (ref >60)
GFR calc non Af Amer: 45 mL/min — ABNORMAL LOW (ref >60)
Glucose, Bld: 115 mg/dL — ABNORMAL HIGH (ref 70–99)
Potassium: 3.4 mmol/L — ABNORMAL LOW (ref 3.5–5.1)
Sodium: 146 mmol/L — ABNORMAL HIGH (ref 135–145)

## 2018-12-02 LAB — URINALYSIS, ROUTINE W REFLEX MICROSCOPIC
BILIRUBIN URINE: NEGATIVE
GLUCOSE, UA: NEGATIVE mg/dL
Hgb urine dipstick: NEGATIVE
Ketones, ur: NEGATIVE mg/dL
Leukocytes, UA: NEGATIVE
Nitrite: NEGATIVE
Protein, ur: NEGATIVE mg/dL
Specific Gravity, Urine: 1.012 (ref 1.005–1.030)
pH: 7 (ref 5.0–8.0)

## 2018-12-02 LAB — GLUCOSE, CAPILLARY
GLUCOSE-CAPILLARY: 124 mg/dL — AB (ref 70–99)
GLUCOSE-CAPILLARY: 114 mg/dL — AB (ref 70–99)
Glucose-Capillary: 113 mg/dL — ABNORMAL HIGH (ref 70–99)
Glucose-Capillary: 115 mg/dL — ABNORMAL HIGH (ref 70–99)
Glucose-Capillary: 120 mg/dL — ABNORMAL HIGH (ref 70–99)
Glucose-Capillary: 136 mg/dL — ABNORMAL HIGH (ref 70–99)
Glucose-Capillary: 148 mg/dL — ABNORMAL HIGH (ref 70–99)
Glucose-Capillary: 153 mg/dL — ABNORMAL HIGH (ref 70–99)
Glucose-Capillary: 157 mg/dL — ABNORMAL HIGH (ref 70–99)
Glucose-Capillary: 90 mg/dL (ref 70–99)

## 2018-12-02 MED ORDER — BISACODYL 10 MG RE SUPP: 10.0000 mg | Freq: Every day | RECTAL | Status: DC | PRN

## 2018-12-02 MED ORDER — PANTOPRAZOLE SODIUM 40 MG PO PACK
40.0000 mg | PACK | Freq: Every day | ORAL | Status: DC
  Administered 2018-12-02 – 2018-12-04 (×3): 40 mg
  Filled 2018-12-02 (×4): qty 20

## 2018-12-02 MED ORDER — FUROSEMIDE 10 MG/ML IJ SOLN
80.0000 mg | Freq: Two times a day (BID) | INTRAMUSCULAR | Status: AC
  Administered 2018-12-02 (×2): 80 mg via INTRAVENOUS
  Filled 2018-12-02 (×2): qty 8

## 2018-12-02 MED ORDER — SODIUM CHLORIDE 0.9 % IV SOLN
2.0000 g | INTRAVENOUS | Status: DC
  Administered 2018-12-03 – 2018-12-08 (×6): 2 g via INTRAVENOUS
  Filled 2018-12-02 (×6): qty 20

## 2018-12-02 MED ORDER — BISACODYL 10 MG RE SUPP
10.0000 mg | Freq: Every day | RECTAL | Status: DC | PRN
  Administered 2018-12-03 – 2018-12-04 (×2): 10 mg via RECTAL
  Filled 2018-12-02 (×2): qty 1

## 2018-12-02 MED ORDER — ACETAMINOPHEN 160 MG/5ML PO SOLN
650.0000 mg | ORAL | Status: DC | PRN
  Administered 2018-12-02: 650 mg
  Filled 2018-12-02: qty 20.3

## 2018-12-02 MED ORDER — POTASSIUM CHLORIDE 10 MEQ/50ML IV SOLN
10.0000 meq | INTRAVENOUS | Status: AC
  Administered 2018-12-02 (×3): 10 meq via INTRAVENOUS
  Filled 2018-12-02 (×2): qty 50

## 2018-12-02 MED ORDER — POLYETHYLENE GLYCOL 3350 17 G PO PACK
17.0000 g | PACK | Freq: Every day | ORAL | Status: DC
  Administered 2018-12-02 – 2018-12-10 (×5): 17 g via ORAL
  Filled 2018-12-02 (×7): qty 1

## 2018-12-02 MED ORDER — CHLORHEXIDINE GLUCONATE CLOTH 2 % EX PADS: 6.0000 | MEDICATED_PAD | Freq: Every day | CUTANEOUS | Status: DC

## 2018-12-02 MED ORDER — SODIUM CHLORIDE 0.9 % IV SOLN
1.0000 g | INTRAVENOUS | Status: AC
  Administered 2018-12-02: 1 g via INTRAVENOUS
  Filled 2018-12-02: qty 10

## 2018-12-02 MED ORDER — POLYETHYLENE GLYCOL 3350 17 G PO PACK
17.0000 g | PACK | Freq: Every day | ORAL | Status: DC | PRN
  Filled 2018-12-02: qty 1

## 2018-12-02 NOTE — Progress Notes (Signed)
eLink Physician-Brief Progress Note Patient Name: [name] DOB: 06-15-1961 MRN: [id number]   Date of Service  12/02/2018  HPI/Events of Note  K+ = 3.4 and Creatinine = 1.65.  eICU Interventions  Will cautiously replace K+.     Intervention Category Major Interventions: Electrolyte abnormality - evaluation and management  Paislynn Hegstrom Eugene 12/02/2018, 6:29 AM

## 2018-12-02 NOTE — Progress Notes (Signed)
eLink Physician-Brief Progress Note Patient Name: [name] DOB: 1961-01-31 MRN: [id number]   Date of Service  12/02/2018  HPI/Events of Note  Fever to 101.8 F - Patient is already on Rocephin, Azithromycin and Tylenol. Respiratory cultures are pending.   eICU Interventions  Will order: 1. Blood cultures X 2 now.     Intervention Category Major Interventions: Infection - evaluation and management  Sommer,Steven Eugene 12/02/2018, 5:36 AM

## 2018-12-02 NOTE — Progress Notes (Signed)
Progress Note  Patient Name: [name] Date of Encounter: 12/02/2018  Primary Cardiologist: [name], MD   Subjective   Denies pain.  Feeling hot.  On ventilator but communicating via writing.   Inpatient Medications    Scheduled Meds: . aspirin  325 mg Per Tube Daily  . chlorhexidine gluconate (MEDLINE KIT)  15 mL Mouth Rinse BID  . Chlorhexidine Gluconate Cloth  6 each Topical Daily  . enoxaparin (LOVENOX) injection  40 mg Subcutaneous Q24H  . feeding supplement (PRO-STAT SUGAR FREE 64)  30 mL Per Tube TID  . insulin aspart  0-15 Units Subcutaneous Q4H  . mouth rinse  15 mL Mouth Rinse 10 times per day  . mupirocin ointment  1 application Nasal BID  . pantoprazole (PROTONIX) IV  40 mg Intravenous QHS  . sodium bicarbonate  25 mEq Intravenous Once  . sodium chloride flush  10-40 mL Intracatheter Q12H   Continuous Infusions: . sodium chloride 10 mL/hr at 12/02/18 0600  . sodium chloride    . azithromycin Stopped (12/01/18 1801)  . cefTRIAXone (ROCEPHIN)  IV Stopped (12/01/18 1057)  . feeding supplement (VITAL HIGH PROTEIN) 55 mL/hr at 12/01/18 1200  . fentaNYL infusion INTRAVENOUS 175 mcg/hr (12/02/18 0600)  . nitroGLYCERIN Stopped (11/29/18 0949)  . norepinephrine (LEVOPHED) Adult infusion Stopped (11/30/18 1101)  . potassium chloride 10 mEq (12/02/18 0648)  . propofol (DIPRIVAN) infusion Stopped (11/29/18 1240)   PRN Meds: Place/Maintain arterial line **AND** sodium chloride, Place/Maintain arterial line **AND** sodium chloride, acetaminophen, fentaNYL, sodium chloride flush   Vital Signs    Vitals:   12/02/18 0602 12/02/18 0700 12/02/18 0800 12/02/18 0805  BP: 126/65 130/65 131/70 124/65  Pulse: (!) 49 (!) 49 (!) 48 (!) 49  Resp: 18 (!) 25 (!) 25 19  Temp: (!) 101.5 F (38.6 C) (!) 101.1 F (38.4 C) (!) 100.8 F (38.2 C)   TempSrc:      SpO2: 95% 95% 94% 93%  Weight:      Height:        Intake/Output Summary (Last 24 hours) at 12/02/2018  0817 Last data filed at 12/02/2018 0600 Gross per 24 hour  Intake 2593.17 ml  Output 3825 ml  Net -1231.83 ml   Filed Weights   12/01/18 0600 12/01/18 0800 12/02/18 0500  Weight: 119 kg 118.7 kg 117.3 kg    Telemetry    Sinus rhythm.  Junctional bradycardia to 40s.  NSVT up to 3 beats.  PVCs.   - Personally Reviewed  ECG    N/a  - Personally Reviewed  Physical Exam   VS:  BP (!) 117/97   Pulse 83   Temp (!) 101.5 F (38.6 C)   Resp (!) 21   Ht '5\' 11"'$  (1.803 m)   Wt 117.3 kg   SpO2 95%   BMI 36.07 kg/m  , BMI Body mass index is 36.07 kg/m.  CVP 18 GENERAL:  On ventilator and awake.  Comfortable. HEENT: Pupils equal round and reactive, fundi not visualized, oral mucosa unremarkable NECK: JVP difficult t3o assess, waveform within normal limits, carotid upstroke brisk and symmetric, no bruits LUNGS:  Clear to auscultation bilaterally HEART:  RRR.  PMI not displaced or sustained,S1 and S2 within normal limits, no S3, no S4, no clicks, no rubs, III/VI systolic murmur at the LUSB ABD:  Flat, positive bowel sounds normal in frequency in pitch, no bruits, no rebound, no guarding, no midline pulsatile mass, no hepatomegaly, no splenomegaly EXT:  2 plus pulses  throughout, no edema, no cyanosis no clubbing SKIN:  No rashes no nodules NEURO:  Cranial nerves II through XII grossly intact, motor grossly intact throughout [facility]  Cognitively intact, oriented to person place and time   Labs    Chemistry Recent Labs  Lab 11/29/18 0807 11/30/18 0435  12/01/18 0525 12/01/18 1658 12/02/18 0334  NA 140 142   < > 142 142 146*  K 4.2 4.0   < > 3.7 3.3* 3.4*  CL 102 106   < > 104 106 108  CO2 19* 24   < > '25 25 26  '$ GLUCOSE 271* 121*   < > 122* 125* 115*  BUN 15 21*   < > 32* 33* 36*  CREATININE 1.86* 1.87*   < > 1.65* 1.54* 1.65*  CALCIUM 9.2 8.4*   < > 8.7* 8.6* 8.8*  PROT 8.5* 6.0*  --   --   --   --   ALBUMIN 4.0 2.9*  --   --   --   --   AST 44* 22  --   --   --   --     ALT 25 21  --   --   --   --   ALKPHOS 99 71  --   --   --   --   BILITOT 1.5* 1.6*  --   --   --   --   GFRNONAA 39* 39*   < > 45* 49* 45*  GFRAA 46* 45*   < > 53* 57* 53*  ANIONGAP 19* 12   < > '13 11 12   '$ < > = values in this interval not displayed.     Hematology Recent Labs  Lab 11/29/18 0807 11/30/18 0435 11/30/18 1019  WBC 19.7* 15.6*  --   RBC 5.64 4.73  --   HGB 15.1 12.5* 12.2*  HCT 50.9 40.5 38.2*  MCV 90.2 85.6  --   MCH 26.8 26.4  --   MCHC 29.7* 30.9  --   RDW 14.7 14.6  --   PLT 195 195  --     Cardiac Enzymes Recent Labs  Lab 11/29/18 1249 11/29/18 1643 11/29/18 2303 11/30/18 1019  TROPONINI 0.25* 0.24* 0.25* 0.28*   No results for input(s): TROPIPOC in the last 168 hours.   BNP Recent Labs  Lab 11/29/18 0808  BNP 1,232.9*     DDimer No results for input(s): DDIMER in the last 168 hours.   Radiology    No results found.  Cardiac Studies   Echo 11/29/18:  Study Conclusions  - Left ventricle: The cavity size was normal. Wall thickness was   increased in a pattern of severe LVH. Systolic function was   normal. The estimated ejection fraction was in the range of 50%   to 55%. Mild hypokinesis of the anteroseptal myocardium. Doppler   parameters are consistent with abnormal left ventricular   relaxation (grade 1 diastolic dysfunction). - Aortic valve: A prosthesis was present and functioning normally.   The prosthesis had a normal range of motion. The sewing ring   appeared normal, had no rocking motion, and showed no evidence of   dehiscence. There was mild regurgitation. Mean gradient (S): 10   mm Hg. Valve area (VTI): 1.69 cm^2. Valve area (Vmax): 1.63 cm^2.   Valve area (Vmean): 1.61 cm^2. - Left atrium: The atrium was severely dilated. - Right ventricle: The cavity size was mildly dilated. Wall   thickness was normal. Systolic function  was moderately reduced. - Right atrium: The atrium was moderately to severely dilated. -  Atrial septum: The septum bowed from right to left, consistent   with increased right atrial pressure. - Pulmonary arteries: Systolic pressure was moderately to severely   increased. PA peak pressure: 56 mm Hg (S).  Impressions:  - Severe LVH. Moderate to severe pulmonary hypertension. Aortic   valve stable with mild regurgitation.  Patient Profile     57 y.o. male with bioprosthetic aortic valve, morbid obesity, here with acute on chronic diastolic heart failure, hypoxic respiratory failure requiring intubation and possible pneumonia.    Assessment & Plan    # Acute on chronic diastolic heart failure: # Hypoxic/hypercarbic respiratory failure: #? CAP: Echo this admission revealed LVEF 50-55%.  BNP was 1200.  Currently on lasix '80mg'$  q12h and is net -1.1L in the last 24 hours.  Renal function slightly worse with diuresis.  However CVP is 18 this AM.  Will continue with diuresis. Goal is -1 to 1.5L daily.  Now off pressors.  Procalcitonin elevated to 6.6.  Rare Staph aureus seen on sputum culture 12/14.  Febrile to 101.8 overnight and blood cultures were drawn.  Currently on Ceftriaxone and azithromycin.  # AKI: Baseline creatinine is around 1.3.  It peaked at 1.87 and is 1.65 today, slightly up from yesterday.   Gentle diuresis as above.  # Cardiogenic/septic shock: Co-ox was 64.8 on 12/14.  Now off pressors.  Last CVP was 18 mmHg while I was in the room this AM.  # Abnormal EKG:  # Elevated troponin: Initially there was concern for STEMI.  However, relatively unchanged from prior. Troponin elevated to 0.28 and flat.  Consistent with demand ischemia.  He denies chest pain.   # s/p AVR: Valve functioning properly on echo this admission.  Mean gradient 10 mmHg.  Mild AR.  # Pulmonary Hypertension: Mod-severe.  PASP 56 mmHg.  Diuresis as above. 3  Time spent: 40 minutes-Greater than 50% of this time was spent in counseling, explanation of diagnosis, planning of further management,  and coordination of care.      For questions or updates, please contact [employer] Please consult [email or web address] for contact info under        Signed, Skeet Latch, MD  12/02/2018, 8:17 AM

## 2018-12-02 NOTE — Progress Notes (Signed)
NAME:  [name], MRN:  [id number], DOB:  Jun 20, 1961, LOS: 3 ADMISSION DATE:  11/29/2018, CONSULTATION DATE: 11/29/2018 REFERRING MD: ED, CHIEF COMPLAINT: Respiratory distress  HPI/course in hospital  57yo male with PMH of atrial flutter, bicuspid aortic valve with stenosis s/p AVR, HTN, sCHF, CKD presenting to [facility] via EMS as code STEMI. Developed acute shortness of breath while driving his car.  Rapidly progressed to intubation on arrival.  Initially thought to have had STEMI but comparison of previous ECGs did not support this.    Antibiotics started for presumed community-acquired pneumonia.  Past Medical History  He,  has a past medical history of Aortic stenosis (11/19/2014), Atrial flutter ([place]), Benign essential HTN (11/19/2014), Benign hypertensive heart and renal disease, CHF (congestive heart failure) ([place]), Chronic systolic CHF (congestive heart failure) ([place]) (11/19/2014), Congenital insufficiency of aortic valve, Congestive cardiomyopathy ([place]), Congestive dilated cardiomyopathy ([place]) (11/19/2014), Essential hypertension, malignant, Insomnia, Murmur, and SOB (shortness of breath).  Past Surgical History:  Procedure Laterality Date  . AORTIC VALVE REPLACEMENT N/A 04/05/2015   Procedure: AORTIC VALVE REPLACEMENT (AVR) using a 21mm Edwards Aortic Magna Ease Valve ;  Surgeon: [name], MD;  Location: [place];  Service: Open Heart Surgery;  Laterality: N/A;  . LEFT AND RIGHT HEART CATHETERIZATION WITH CORONARY ANGIOGRAM N/A 11/27/2014   Procedure: LEFT AND RIGHT HEART CATHETERIZATION WITH CORONARY ANGIOGRAM;  Surgeon: [name], MD;  Location: [facility] CATH LAB;  Service: Cardiovascular;  Laterality: N/A;  . MAZE N/A 04/05/2015   Procedure: MAZE;  Surgeon: [name], MD;  Location: [place];  Service: Open Heart Surgery;  Laterality: N/A;  . REPLACEMENT ASCENDING AORTA N/A 04/05/2015   Procedure: REPLACEMENT ASCENDING AORTA with a 85mm Hemashield Platinum Graft;  Surgeon: [name], MD;  Location: [place];  Service: Open Heart Surgery;  Laterality: N/A;  . TEE WITHOUT CARDIOVERSION N/A 04/05/2015   Procedure: TRANSESOPHAGEAL ECHOCARDIOGRAM (TEE);  Surgeon: [name], MD;  Location: [place];  Service: Open Heart Surgery;  Laterality: N/A;    Interim history/subjective:  Patient fully awake on fentanyl gtt 150 mcg/hr, communicating by writing and participating in his own mouth care.  Denies discomfort. On SBT this am- patient became acutely dyspneic, low TV, tachycardic, diaphoretic Ongoing fever with thick secretions CVP 18 this morning Ongoing diuresis, net -1.5L, sCr slightly worse, good UOP Aline not working  Objective   Blood pressure 124/73, pulse (!) 50, temperature (!) 101.1 F (38.4 C), resp. rate (!) 25, height 5\' 11"  (1.803 m), weight 117.3 kg, SpO2 96 %. CVP:  [9 mmHg-17 mmHg] 17 mmHg  Vent Mode: PRVC FiO2 (%):  [40 %] 40 % Set Rate:  [25 bmp] 25 bmp Vt Set:  [620 mL] 620 mL PEEP:  [8 cmH20] 8 cmH20 Pressure Support:  [12 cmH20] 12 cmH20 Plateau Pressure:  [22 cmH20-27 cmH20] 27 cmH20   Intake/Output Summary (Last 24 hours) at 12/02/2018 1204 Last data filed at 12/02/2018 1100 Gross per 24 hour  Intake 2407.73 ml  Output 4425 ml  Net -2017.27 ml   Filed Weights   12/01/18 0600 12/01/18 0800 12/02/18 0500  Weight: 119 kg 118.7 kg 117.3 kg   CVP:  [9 mmHg-17 mmHg] 17 mmHg   General:  Adult male sitting upright in bed on MV in NAD HEENT: MM pink/moist, ETT 25 cm, OGT, pupils 4/reactive Neuro: Awake, MAE, communicates by writing, non focal CV: SB, +2 pulses PULM: even/non-labored on full MV support, lungs bilaterally coarse GI: soft,  non-tender, bsx4 active, unclear last BM Extremities: warm/dry, no edema  Skin: no rashes   Ancillary tests (personally reviewed)  CBC: Recent Labs  Lab 11/29/18 0807 11/30/18 0435 11/30/18 1019  WBC 19.7* 15.6*  --   NEUTROABS 15.1*  --   --   HGB 15.1 12.5* 12.2*  HCT 50.9 40.5 38.2*  MCV  90.2 85.6  --   PLT 195 195  --     Basic Metabolic Panel: Recent Labs  Lab 11/29/18 1248 11/30/18 0435 11/30/18 1749 12/01/18 0525 12/01/18 1658 12/02/18 0334  NA  --  142 142 142 142 146*  K  --  4.0 3.9 3.7 3.3* 3.4*  CL  --  106 105 104 106 108  CO2  --  24 26 25 25 26   GLUCOSE  --  121* 125* 122* 125* 115*  BUN  --  21* 24* 32* 33* 36*  CREATININE  --  1.87* 1.82* 1.65* 1.54* 1.65*  CALCIUM  --  8.4* 8.7* 8.7* 8.6* 8.8*  MG 2.1 2.1 2.0 2.4 2.2  --   PHOS 5.4* 3.4 2.4* 2.5 3.5  --    GFR: Estimated Creatinine Clearance: 64.3 mL/min (A) (by C-G formula based on SCr of 1.65 mg/dL (H)). Recent Labs  Lab 11/29/18 0807 11/29/18 1248 11/29/18 1249 11/30/18 0435 12/01/18 0525  PROCALCITON  --   --  3.11 6.59 5.29  WBC 19.7*  --   --  15.6*  --   LATICACIDVEN  --  1.6  --   --   --     Liver Function Tests: Recent Labs  Lab 11/29/18 0807 11/30/18 0435  AST 44* 22  ALT 25 21  ALKPHOS 99 71  BILITOT 1.5* 1.6*  PROT 8.5* 6.0*  ALBUMIN 4.0 2.9*   No results for input(s): LIPASE, AMYLASE in the last 168 hours. No results for input(s): AMMONIA in the last 168 hours.  ABG    Component Value Date/Time   PHART 7.463 (H) 11/30/2018 0435   PCO2ART 33.4 11/30/2018 0435   PO2ART 98.9 11/30/2018 0435   HCO3 23.3 11/30/2018 0435   TCO2 24 11/29/2018 1313   ACIDBASEDEF 3.0 (H) 11/29/2018 1313   O2SAT 64.8 11/30/2018 1538     Coagulation Profile: No results for input(s): INR, PROTIME in the last 168 hours.  Cardiac Enzymes: Recent Labs  Lab 11/29/18 0807 11/29/18 1249 11/29/18 1643 11/29/18 2303 11/30/18 1019  TROPONINI 0.15* 0.25* 0.24* 0.25* 0.28*    HbA1C: Hgb A1c MFr Bld  Date/Time Value Ref Range Status  03/29/2015 05:19 AM 6.9 (H) 4.8 - 5.6 % Final    Comment:    (NOTE)         Pre-diabetes: 5.7 - 6.4         Diabetes: >6.4         Glycemic control for adults with diabetes: <7.0     CBG: Recent Labs  Lab 12/01/18 2343 12/02/18 0344  12/02/18 0504 12/02/18 0755 12/02/18 1124  GLUCAP 148* 124* 114* 120* 157*   Cultures 12/13 MRSA PCR >> staph +, MRSA neg 12/14 trach asp >> 12/16 BC x 2 >>  ABX  12/13 ceftriaxone >> 12/16 increase dose 12/13 azithro >> 12/16  Assessment & Plan:  Critically ill due to acute respiratory failure with hypoxia and hypercarbia and CAP requiring mechanical ventilation.   P:  Continue full MV support, PRVC Wean FiO2/ PEEP for sats > 92% Ongoing diuresis as below Continue abx; follow sputum cultures  Shock- presumed  cardiogenic +/- component of sepsis- resolved  P:  Off pressors Tele monitoring Goal MAP >65  Acute on chronic diastolic heart failure, hx AVR  - TTE LVEF 50-55% with mod to severe pulm HTN- PASP 56 mmHg, BNP 1200 P:  Appreciate cardiology input Ongoing lasix 80mg  q 12- CVP remains high 18, overall net -1.1L/ 24 hrs  AKI Hypokalemia - sCr slightly up today, UOP remains good P:  Continue gentle diuresis as above Electrolyte replacement as needed Strict I/O's/ daily wts Trend renal function  Leukocytosis/ fever/ CAP  - elevated PCT 5.29 - neg urine leg/ strep - MRSA PCR neg, stap + P:  Follow cultures- BC sent overnight Increase ceftriaxone to 2 gm /24 hour D/c azithro/ day 4 Send UA Tylenol prn fever   Hyperglycemia P:  Trend CBG q 4,  SSI   Best practice:  Diet: continue tube feed protocol Pain/Anxiety/Delirium protocol (if indicated): wean fent gtt as able VAP protocol (if indicated): In place DVT prophylaxis: Lovenox GI prophylaxis: Protonix per tube Glucose control: Adequate control with subcu insulin Mobility: Bedrest Code Status: Full Family Communication: Family present Disposition: ICU  Critical care time: 63 mins    Kennieth Rad, AGACNP-BC [facility] Pgr: [phone] or if no answer [phone] 12/02/2018, 1:40 PM

## 2018-12-03 ENCOUNTER — Inpatient Hospital Stay (HOSPITAL_COMMUNITY): Payer: Medicare Other

## 2018-12-03 DIAGNOSIS — Z952 Presence of prosthetic heart valve: Secondary | ICD-10-CM

## 2018-12-03 DIAGNOSIS — R6521 Severe sepsis with septic shock: Secondary | ICD-10-CM

## 2018-12-03 LAB — MAGNESIUM: MAGNESIUM: 2.6 mg/dL — AB (ref 1.7–2.4)

## 2018-12-03 LAB — CULTURE, RESPIRATORY W GRAM STAIN

## 2018-12-03 LAB — GLUCOSE, CAPILLARY
GLUCOSE-CAPILLARY: 103 mg/dL — AB (ref 70–99)
GLUCOSE-CAPILLARY: 92 mg/dL (ref 70–99)
Glucose-Capillary: 130 mg/dL — ABNORMAL HIGH (ref 70–99)
Glucose-Capillary: 154 mg/dL — ABNORMAL HIGH (ref 70–99)
Glucose-Capillary: 134 mg/dL — ABNORMAL HIGH (ref 70–99)

## 2018-12-03 LAB — CBC
HCT: 37.8 % — ABNORMAL LOW (ref 39.0–52.0)
Hemoglobin: 11.8 g/dL — ABNORMAL LOW (ref 13.0–17.0)
MCH: 26.8 pg (ref 26.0–34.0)
MCHC: 31.2 g/dL (ref 30.0–36.0)
MCV: 85.9 fL (ref 80.0–100.0)
Platelets: 211 10*3/uL (ref 150–400)
RBC: 4.4 MIL/uL (ref 4.22–5.81)
RDW: 14.6 % (ref 11.5–15.5)
WBC: 12.4 10*3/uL — ABNORMAL HIGH (ref 4.0–10.5)
nRBC: 0 % (ref 0.0–0.2)

## 2018-12-03 LAB — RENAL FUNCTION PANEL
Albumin: 3 g/dL — ABNORMAL LOW (ref 3.5–5.0)
Anion gap: 14 (ref 5–15)
BUN: 40 mg/dL — ABNORMAL HIGH (ref 6–20)
CO2: 26 mmol/L (ref 22–32)
Calcium: 9 mg/dL (ref 8.9–10.3)
Chloride: 106 mmol/L (ref 98–111)
Creatinine, Ser: 1.71 mg/dL — ABNORMAL HIGH (ref 0.61–1.24)
GFR, EST AFRICAN AMERICAN: 50 mL/min — AB (ref >60)
GFR, EST NON AFRICAN AMERICAN: 43 mL/min — AB (ref >60)
Glucose, Bld: 118 mg/dL — ABNORMAL HIGH (ref 70–99)
Phosphorus: 4.4 mg/dL (ref 2.5–4.6)
Potassium: 3.5 mmol/L (ref 3.5–5.1)
Sodium: 146 mmol/L — ABNORMAL HIGH (ref 135–145)

## 2018-12-03 LAB — PROCALCITONIN: Procalcitonin: 1.97 ng/mL

## 2018-12-03 MED ORDER — POTASSIUM CHLORIDE 20 MEQ/15ML (10%) PO SOLN
40.0000 meq | Freq: Once | ORAL | Status: AC
  Administered 2018-12-03: 40 meq
  Filled 2018-12-03: qty 30

## 2018-12-03 NOTE — Progress Notes (Signed)
Progress Note  Patient Name: [name] Date of Encounter: 12/03/2018  Primary Cardiologist: [name], MD   Subjective   Intubated and sedated. Unable to assess.  Inpatient Medications    Scheduled Meds: . aspirin  325 mg Per Tube Daily  . chlorhexidine gluconate (MEDLINE KIT)  15 mL Mouth Rinse BID  . Chlorhexidine Gluconate Cloth  6 each Topical Daily  . [START ON 12/04/2018] Chlorhexidine Gluconate Cloth  6 each Topical Daily  . enoxaparin (LOVENOX) injection  40 mg Subcutaneous Q24H  . feeding supplement (PRO-STAT SUGAR FREE 64)  30 mL Per Tube TID  . insulin aspart  0-15 Units Subcutaneous Q4H  . mouth rinse  15 mL Mouth Rinse 10 times per day  . mupirocin ointment  1 application Nasal BID  . pantoprazole sodium  40 mg Per Tube Daily  . polyethylene glycol  17 g Oral Daily  . sodium bicarbonate  25 mEq Intravenous Once  . sodium chloride flush  10-40 mL Intracatheter Q12H   Continuous Infusions: . cefTRIAXone (ROCEPHIN)  IV    . feeding supplement (VITAL HIGH PROTEIN) 55 mL/hr at 12/02/18 2100  . fentaNYL infusion INTRAVENOUS 100 mcg/hr (12/03/18 0700)  . nitroGLYCERIN Stopped (11/29/18 0949)  . norepinephrine (LEVOPHED) Adult infusion Stopped (11/30/18 1101)   PRN Meds: acetaminophen (TYLENOL) oral liquid 160 mg/5 mL, bisacodyl, fentaNYL, polyethylene glycol, sodium chloride flush   Vital Signs    Vitals:   12/03/18 0447 12/03/18 0500 12/03/18 0600 12/03/18 0700  BP:   127/82 121/81  Pulse: (!) 48 (!) 48 (!) 49 (!) 48  Resp: (!) 25 (!) 22 (!) 25 (!) 25  Temp:      TempSrc:      SpO2: 96% 98% 94% 97%  Weight:  113.8 kg    Height:        Intake/Output Summary (Last 24 hours) at 12/03/2018 0747 Last data filed at 12/03/2018 0700 Gross per 24 hour  Intake 2470.1 ml  Output 5700 ml  Net -3229.9 ml   Filed Weights   12/01/18 0800 12/02/18 0500 12/03/18 0500  Weight: 118.7 kg 117.3 kg 113.8 kg    Telemetry    Accelerated ventricular  escape and junctional bradycardia to 40-60s.  - Personally Reviewed  ECG    N/a  - Personally Reviewed  Physical Exam   VS:  BP 121/81   Pulse (!) 48   Temp 98.4 F (36.9 C) (Oral)   Resp (!) 25   Ht 5' 11" (1.803 m)   Wt 113.8 kg   SpO2 97%   BMI 34.99 kg/m  , BMI Body mass index is 34.99 kg/m.  CVP 18 GENERAL:  On ventilator and awake.  Comfortable. HEENT: Pupils equal round and reactive, fundi not visualized, oral mucosa unremarkable NECK: JVP difficult to assess, waveform within normal limits, carotid upstroke brisk and symmetric, no bruits LUNGS:  Clear to auscultation bilaterally HEART:  RRR.  PMI not displaced or sustained,S1 and S2 within normal limits, no S3, no S4, no clicks, no rubs, III/VI systolic murmur at the LUSB ABD:  Flat, positive bowel sounds normal in frequency in pitch, no bruits, no rebound, no guarding, no midline pulsatile mass, no hepatomegaly, no splenomegaly EXT:  2 plus pulses throughout, no edema, no cyanosis no clubbing SKIN:  No rashes no nodules NEURO:  Moves all 4 extremities freely PSYCH:  Unable to assess   Labs    Chemistry Recent Labs  Lab 11/29/18 [phone] 11/30/18 0435  12/01/18 1658  12/02/18 0334 12/03/18 0325  NA 140 142   < > 142 146* 146*  K 4.2 4.0   < > 3.3* 3.4* 3.5  CL 102 106   < > 106 108 106  CO2 19* 24   < > _0 GLUCOSE 271* 121*   < > 125* 115* 118*  BUN 15 21*   < > 33* 36* 40*  CREATININE 1.86* 1.87*   < > 1.54* 1.65* 1.71*  CALCIUM 9.2 8.4*   < > 8.6* 8.8* 9.0  PROT 8.5* 6.0*  --   --   --   --   ALBUMIN 4.0 2.9*  --   --   --  3.0*  AST 44* 22  --   --   --   --   ALT 25 21  --   --   --   --   ALKPHOS 99 71  --   --   --   --   BILITOT 1.5* 1.6*  --   --   --   --   GFRNONAA 39* 39*   < > 49* 45* 43*  GFRAA 46* 45*   < > 57* 53* 50*  ANIONGAP 19* 12   < > _1 < > = values in this interval not displayed.     Hematology Recent Labs  Lab 11/29/18 0807 11/30/18 0435 11/30/18 1019  12/03/18 0325  WBC 19.7* 15.6*  --  12.4*  RBC 5.64 4.73  --  4.40  HGB 15.1 12.5* 12.2* 11.8*  HCT 50.9 40.5 38.2* 37.8*  MCV 90.2 85.6  --  85.9  MCH 26.8 26.4  --  26.8  MCHC 29.7* 30.9  --  31.2  RDW 14.7 14.6  --  14.6  PLT 195 195  --  211    Cardiac Enzymes Recent Labs  Lab 11/29/18 1249 11/29/18 1643 11/29/18 2303 11/30/18 1019  TROPONINI 0.25* 0.24* 0.25* 0.28*   No results for input(s): TROPIPOC in the last 168 hours.   BNP Recent Labs  Lab 11/29/18 0808  BNP 1,232.9*     DDimer No results for input(s): DDIMER in the last 168 hours.   Radiology    No results found.  Cardiac Studies   Echo 11/29/18:  Study Conclusions  - Left ventricle: The cavity size was normal. Wall thickness was   increased in a pattern of severe LVH. Systolic function was   normal. The estimated ejection fraction was in the range of 50%   to 55%. Mild hypokinesis of the anteroseptal myocardium. Doppler   parameters are consistent with abnormal left ventricular   relaxation (grade 1 diastolic dysfunction). - Aortic valve: A prosthesis was present and functioning normally.   The prosthesis had a normal range of motion. The sewing ring   appeared normal, had no rocking motion, and showed no evidence of   dehiscence. There was mild regurgitation. Mean gradient (S): 10   mm Hg. Valve area (VTI): 1.69 cm^2. Valve area (Vmax): 1.63 cm^2.   Valve area (Vmean): 1.61 cm^2. - Left atrium: The atrium was severely dilated. - Right ventricle: The cavity size was mildly dilated. Wall   thickness was normal. Systolic function was moderately reduced. - Right atrium: The atrium was moderately to severely dilated. - Atrial septum: The septum bowed from right to left, consistent   with increased right atrial pressure. - Pulmonary arteries: Systolic pressure was moderately to severely   increased. PA peak pressure:  56 mm Hg (S).  Impressions:  - Severe LVH. Moderate to severe pulmonary  hypertension. Aortic   valve stable with mild regurgitation.  Patient Profile     57 y.o. male with bioprosthetic aortic valve, morbid obesity, here with acute on chronic diastolic heart failure, hypoxic respiratory failure requiring intubation and possible pneumonia.    Assessment & Plan    # Acute on chronic diastolic heart failure: # Hypoxic/hypercarbic respiratory failure: #? CAP: Echo this admission revealed LVEF 50-55%.  BNP was 1200.  Received 2 doses of lasix 43m q12h yesterday and is -3.2L in the last 24 hours.  Renal function slightly worse with diuresis.  Last CVP was 8 so we will hold diuresis today. No significant pulmonary edema on CXR today.  Currently on Ceftriaxone and azithromycin for likely MSSA pneumonia.  He failed SBT yesterday due to increased secretions  # AKI: Baseline creatinine is around 1.3.  Up tot 1.7 today with diuresis.  CVP is 8.  Hold diuresis as above.  # Cardiogenic/septic shock: Co-ox was 64.8 on 12/14.  CVP 8.  # Abnormal EKG:  # Elevated troponin: Initially there was concern for STEMI.  However, relatively unchanged from prior. Troponin elevated to 0.28 and flat.  Consistent with demand ischemia.  He denies chest pain.   # s/p AVR: Valve functioning properly on echo this admission.  Mean gradient 10 mmHg.  Mild AR.  # Pulmonary Hypertension: Mod-severe.  PASP 56 mmHg.  Diuresis as above. 3  Time spent: 30 minutes-Greater than 50% of this time was spent in counseling, explanation of diagnosis, planning of further management, and coordination of care.      For questions or updates, please contact [email or web address] consult [email or web address] for contact info under        Signed, TSkeet Latch MD  12/03/2018, 7:47 AM

## 2018-12-03 NOTE — Plan of Care (Signed)
  Problem: Clinical Measurements: Goal: Ability to maintain clinical measurements within normal limits will improve Outcome: Progressing Goal: Cardiovascular complication will be avoided Outcome: Progressing   Problem: Activity: Goal: Risk for activity intolerance will decrease Outcome: Progressing   Problem: Nutrition: Goal: Adequate nutrition will be maintained Outcome: Progressing Note:  Patient is NPO d/t mechanical ventilation. Tube feeds were started   Problem: Coping: Goal: Level of anxiety will decrease Outcome: Progressing   Problem: Elimination: Goal: Will not experience complications related to urinary retention Outcome: Progressing   Problem: Pain Managment: Goal: General experience of comfort will improve Outcome: Progressing   Problem: Safety: Goal: Ability to remain free from injury will improve Outcome: Progressing   Problem: Skin Integrity: Goal: Risk for impaired skin integrity will decrease Outcome: Progressing

## 2018-12-03 NOTE — Progress Notes (Signed)
Pt is on full vent support and with peak pressure alarms.  RT suctioned pt's airway, changed the HME and vent filters without reducing the peak pressures.  Pt placed on wean 5/5 pressures reduced.  RT spoke with CCM.

## 2018-12-03 NOTE — Progress Notes (Addendum)
NAME:  [name], MRN:  [id number], DOB:  05/24/1961, LOS: 4 ADMISSION DATE:  11/29/2018, CONSULTATION DATE: 11/29/2018 REFERRING MD: ED, CHIEF COMPLAINT: Respiratory distress  HPI/course in hospital  57yo male with PMH of atrial flutter, bicuspid aortic valve with stenosis s/p AVR, HTN, sCHF, CKD presenting to [facility] via EMS as code STEMI. Developed acute shortness of breath while driving his car.  Rapidly progressed to intubation on arrival.  Initially thought to have had STEMI but comparison of previous ECGs did not support this.    Antibiotics started for presumed community-acquired pneumonia.  Past Medical History  He,  has a past medical history of Aortic stenosis (11/19/2014), Atrial flutter ([place]), Benign essential HTN (11/19/2014), Benign hypertensive heart and renal disease, CHF (congestive heart failure) ([place]), Chronic systolic CHF (congestive heart failure) ([place]) (11/19/2014), Congenital insufficiency of aortic valve, Congestive cardiomyopathy ([place]), Congestive dilated cardiomyopathy ([place]) (11/19/2014), Essential hypertension, malignant, Insomnia, Murmur, and SOB (shortness of breath).  Past Surgical History:  Procedure Laterality Date  . AORTIC VALVE REPLACEMENT N/A 04/05/2015   Procedure: AORTIC VALVE REPLACEMENT (AVR) using a 79mm Edwards Aortic Magna Ease Valve ;  Surgeon: [name], MD;  Location: [place];  Service: Open Heart Surgery;  Laterality: N/A;  . LEFT AND RIGHT HEART CATHETERIZATION WITH CORONARY ANGIOGRAM N/A 11/27/2014   Procedure: LEFT AND RIGHT HEART CATHETERIZATION WITH CORONARY ANGIOGRAM;  Surgeon: [name], MD;  Location: [facility] CATH LAB;  Service: Cardiovascular;  Laterality: N/A;  . MAZE N/A 04/05/2015   Procedure: MAZE;  Surgeon: [name], MD;  Location: [place];  Service: Open Heart Surgery;  Laterality: N/A;  . REPLACEMENT ASCENDING AORTA N/A 04/05/2015   Procedure: REPLACEMENT ASCENDING AORTA with a 55mm Hemashield Platinum Graft;  Surgeon: [name], MD;  Location: [place];  Service: Open Heart Surgery;  Laterality: N/A;  . TEE WITHOUT CARDIOVERSION N/A 04/05/2015   Procedure: TRANSESOPHAGEAL ECHOCARDIOGRAM (TEE);  Surgeon: [name], MD;  Location: [place];  Service: Open Heart Surgery;  Laterality: N/A;    Interim history/subjective:  Off sedation since 0730 SBT 10/5 till noon, then wrote he was tired and placed back on full support, then noted to have high PIP into the 40's which have since resolved, back on SBT Secretions much improved Renal function elevated, diuresis held, CVP 14, CXR improved Ongoing constipation- causing moderate discomfort for him  Objective   Blood pressure (!) 137/112, pulse (!) 54, temperature 99.1 F (37.3 C), temperature source Oral, resp. rate 19, height 5\' 11"  (1.803 m), weight 113.8 kg, SpO2 98 %. CVP:  [8 mmHg-14 mmHg] 14 mmHg  Vent Mode: PSV;CPAP FiO2 (%):  [40 %] 40 % Set Rate:  [25 bmp] 25 bmp Vt Set:  [620 mL] 620 mL PEEP:  [5 cmH20] 5 cmH20 Pressure Support:  [10 cmH20] 10 cmH20 Plateau Pressure:  [21 cmH20-25 cmH20] 23 cmH20   Intake/Output Summary (Last 24 hours) at 12/03/2018 1319 Last data filed at 12/03/2018 1300 Gross per 24 hour  Intake 2452.58 ml  Output 4175 ml  Net -1722.42 ml   Filed Weights   12/01/18 0800 12/02/18 0500 12/03/18 0500  Weight: 118.7 kg 117.3 kg 113.8 kg   CVP:  [8 mmHg-14 mmHg] 14 mmHg   General:  Adult male critically ill sitting upright in bed on MV in no distress HEENT: MM pink/moist, pupils 4/reactive, ETT/ OGT, R IJ site wnl Neuro: Fully awake, non focal, still participating in mouth care and writing notes CV: rr, slight  murmur PULM: even/non-labored on PSV 10/5, lungs bilaterally clear GI: obese, distended, non tender, +BS- no BM since last week Extremities: warm/dry, no edema  Skin: no rashes    Ancillary tests (personally reviewed)  CBC: Recent Labs  Lab 11/29/18 0807 11/30/18 0435 11/30/18 1019 12/03/18 0325  WBC 19.7*  15.6*  --  12.4*  NEUTROABS 15.1*  --   --   --   HGB 15.1 12.5* 12.2* 11.8*  HCT 50.9 40.5 38.2* 37.8*  MCV 90.2 85.6  --  85.9  PLT 195 195  --  829    Basic Metabolic Panel: Recent Labs  Lab 11/30/18 0435 11/30/18 1749 12/01/18 0525 12/01/18 1658 12/02/18 0334 12/03/18 0325  NA 142 142 142 142 146* 146*  K 4.0 3.9 3.7 3.3* 3.4* 3.5  CL 106 105 104 106 108 106  CO2 24 26 25 25 26 26   GLUCOSE 121* 125* 122* 125* 115* 118*  BUN 21* 24* 32* 33* 36* 40*  CREATININE 1.87* 1.82* 1.65* 1.54* 1.65* 1.71*  CALCIUM 8.4* 8.7* 8.7* 8.6* 8.8* 9.0  MG 2.1 2.0 2.4 2.2  --  2.6*  PHOS 3.4 2.4* 2.5 3.5  --  4.4   GFR: Estimated Creatinine Clearance: 61.1 mL/min (A) (by C-G formula based on SCr of 1.71 mg/dL (H)). Recent Labs  Lab 11/29/18 0807 11/29/18 1248 11/29/18 1249 11/30/18 0435 12/01/18 0525 12/03/18 0325  PROCALCITON  --   --  3.11 6.59 5.29 1.97  WBC 19.7*  --   --  15.6*  --  12.4*  LATICACIDVEN  --  1.6  --   --   --   --     Liver Function Tests: Recent Labs  Lab 11/29/18 0807 11/30/18 0435 12/03/18 0325  AST 44* 22  --   ALT 25 21  --   ALKPHOS 99 71  --   BILITOT 1.5* 1.6*  --   PROT 8.5* 6.0*  --   ALBUMIN 4.0 2.9* 3.0*   No results for input(s): LIPASE, AMYLASE in the last 168 hours. No results for input(s): AMMONIA in the last 168 hours.  ABG    Component Value Date/Time   PHART 7.463 (H) 11/30/2018 0435   PCO2ART 33.4 11/30/2018 0435   PO2ART 98.9 11/30/2018 0435   HCO3 23.3 11/30/2018 0435   TCO2 24 11/29/2018 1313   ACIDBASEDEF 3.0 (H) 11/29/2018 1313   O2SAT 64.8 11/30/2018 1538     Coagulation Profile: No results for input(s): INR, PROTIME in the last 168 hours.  Cardiac Enzymes: Recent Labs  Lab 11/29/18 0807 11/29/18 1249 11/29/18 1643 11/29/18 2303 11/30/18 1019  TROPONINI 0.15* 0.25* 0.24* 0.25* 0.28*    HbA1C: Hgb A1c MFr Bld  Date/Time Value Ref Range Status  03/29/2015 05:19 AM 6.9 (H) 4.8 - 5.6 % Final    Comment:     (NOTE)         Pre-diabetes: 5.7 - 6.4         Diabetes: >6.4         Glycemic control for adults with diabetes: <7.0     CBG: Recent Labs  Lab 12/02/18 2034 12/02/18 2356 12/03/18 0330 12/03/18 0820 12/03/18 1237  GLUCAP 115* 153* 103* 130* 154*   Cultures 12/13 MRSA PCR >> staph +, MRSA neg 12/14 trach asp >> 12/16 BC x 2 >>  ABX  12/13 ceftriaxone >> 12/16 increase dose >> 12/13 azithro >> 12/16  Assessment & Plan:  Acute respiratory failure with hypoxia and hypercarbia and CAP  requiring mechanical ventilation.   P:  Continue full MV support, PRVC Wean FiO2/ PEEP for sats > 92% Ongoing SBT- possible extubation this afternoon Continue abx; follow sputum cultures Previously high PIP attributed to probable abd distention/ discomfort from constipation  Shock- presumed cardiogenic +/- component of sepsis- resolved  P:  Off pressors Tele monitoring Goal MAP >65  Acute on chronic diastolic heart failure, hx AVR  - TTE LVEF 50-55% with mod to severe pulm HTN- PASP 56 mmHg, BNP 1200 P:  Appreciate cardiology input Holding further diuresis for now given increased sCr, CVP improved  AKI Hypokalemia- mag OK - sCr slightly up again today, UOP remains good P:  KCL x 1, K goal > 4 Electrolyte replacement as needed Strict I/O's/ daily wts Trend renal function  Leukocytosis/ fever/ CAP  - improving PCT 5.29 -> 1.97 - neg urine leg/ strep - MRSA PCR neg, stap + P:  Follow cultures Continue ceftriaxone to 2 gm /24 hour UA neg Tylenol prn fever  Hyperglycemia P:  Trend CBG q 4,  SSI   Constipation- likely opoid induced P:  miralax and dulcolax prn  Goal K > 4  Best practice:  Diet: continue tube feed protocol Pain/Anxiety/Delirium protocol (if indicated): prn sedation only VAP protocol (if indicated): In place DVT prophylaxis: Lovenox GI prophylaxis: Protonix per tube Glucose control: SSI Mobility: Bedrest Code Status: Full Family Communication:  no family present 12/17 Disposition: ICU  Critical care time: 31 mins    Kennieth Rad, AGACNP-BC [place] Pgr: [phone] or if no answer [phone] 12/03/2018, 1:19 PM

## 2018-12-04 ENCOUNTER — Inpatient Hospital Stay (HOSPITAL_COMMUNITY): Payer: Medicare Other

## 2018-12-04 DIAGNOSIS — J9601 Acute respiratory failure with hypoxia: Secondary | ICD-10-CM

## 2018-12-04 DIAGNOSIS — R0902 Hypoxemia: Secondary | ICD-10-CM

## 2018-12-04 DIAGNOSIS — I10 Essential (primary) hypertension: Secondary | ICD-10-CM

## 2018-12-04 LAB — CBC
HCT: 39 % (ref 39.0–52.0)
Hemoglobin: 11.9 g/dL — ABNORMAL LOW (ref 13.0–17.0)
MCH: 26.6 pg (ref 26.0–34.0)
MCHC: 30.5 g/dL (ref 30.0–36.0)
MCV: 87.1 fL (ref 80.0–100.0)
NRBC: 0 % (ref 0.0–0.2)
Platelets: 216 10*3/uL (ref 150–400)
RBC: 4.48 MIL/uL (ref 4.22–5.81)
RDW: 14.6 % (ref 11.5–15.5)
WBC: 11.6 10*3/uL — ABNORMAL HIGH (ref 4.0–10.5)

## 2018-12-04 LAB — RENAL FUNCTION PANEL
Albumin: 2.9 g/dL — ABNORMAL LOW (ref 3.5–5.0)
Anion gap: 12 (ref 5–15)
BUN: 37 mg/dL — ABNORMAL HIGH (ref 6–20)
CHLORIDE: 112 mmol/L — AB (ref 98–111)
CO2: 25 mmol/L (ref 22–32)
Calcium: 9.4 mg/dL (ref 8.9–10.3)
Creatinine, Ser: 1.41 mg/dL — ABNORMAL HIGH (ref 0.61–1.24)
GFR calc non Af Amer: 55 mL/min — ABNORMAL LOW (ref >60)
Glucose, Bld: 132 mg/dL — ABNORMAL HIGH (ref 70–99)
Phosphorus: 3.8 mg/dL (ref 2.5–4.6)
Potassium: 3.6 mmol/L (ref 3.5–5.1)
Sodium: 149 mmol/L — ABNORMAL HIGH (ref 135–145)

## 2018-12-04 LAB — GLUCOSE, CAPILLARY
Glucose-Capillary: 116 mg/dL — ABNORMAL HIGH (ref 70–99)
Glucose-Capillary: 129 mg/dL — ABNORMAL HIGH (ref 70–99)
Glucose-Capillary: 131 mg/dL — ABNORMAL HIGH (ref 70–99)
Glucose-Capillary: 146 mg/dL — ABNORMAL HIGH (ref 70–99)
Glucose-Capillary: 158 mg/dL — ABNORMAL HIGH (ref 70–99)
Glucose-Capillary: 86 mg/dL (ref 70–99)
Glucose-Capillary: 99 mg/dL (ref 70–99)

## 2018-12-04 MED ORDER — FUROSEMIDE 40 MG PO TABS
40.0000 mg | ORAL_TABLET | Freq: Every day | ORAL | Status: DC
  Administered 2018-12-04: 40 mg
  Filled 2018-12-04: qty 1

## 2018-12-04 MED ORDER — AMLODIPINE BESYLATE 5 MG PO TABS
2.5000 mg | ORAL_TABLET | Freq: Every day | ORAL | Status: DC
  Administered 2018-12-04: 2.5 mg
  Filled 2018-12-04: qty 1

## 2018-12-04 NOTE — Plan of Care (Signed)
  Problem: Activity: Goal: Risk for activity intolerance will decrease Outcome: Progressing   Problem: Nutrition: Goal: Adequate nutrition will be maintained Outcome: Progressing Note:  NPO r/t to mechanical ventilation. Tube feeds were started   Problem: Coping: Goal: Level of anxiety will decrease Outcome: Progressing   Problem: Elimination: Goal: Will not experience complications related to urinary retention Outcome: Progressing   Problem: Pain Managment: Goal: General experience of comfort will improve Outcome: Progressing   Problem: Safety: Goal: Ability to remain free from injury will improve Outcome: Progressing   Problem: Skin Integrity: Goal: Risk for impaired skin integrity will decrease Outcome: Progressing

## 2018-12-04 NOTE — Progress Notes (Addendum)
NAME:  [name], MRN:  [id number], DOB:  1961/09/04, LOS: 5 ADMISSION DATE:  11/29/2018, CONSULTATION DATE: 11/29/2018 REFERRING MD: ED, CHIEF COMPLAINT: Respiratory distress  HPI/course in hospital  57yo male with PMH of atrial flutter, bicuspid aortic valve with stenosis s/p AVR, HTN, sCHF, CKD presenting to [facility] via EMS as code STEMI. Developed acute shortness of breath while driving his car.  Rapidly progressed to intubation on arrival.  Initially thought to have had STEMI but comparison of previous ECGs did not support this.    Antibiotics started for presumed community-acquired pneumonia.   EVENTS  12/17 - Patient fully awake on fentanyl gtt 150 mcg/hr, communicating by writing and participating in his own mouth care.  Denies discomfort. On SBT this am- patient became acutely dyspneic, low TV, tachycardic, diaphoretic Ongoing fever with thick secretions CVP 18 this morning Ongoing diuresis, net -1.5L, sCr slightly worse, good UOP Aline not working\   Cultures 12/13 MRSA PCR >> staph +, MRSA neg 12/14 trach asp >> rare MSSA 12/16 BC x 2 >>  ABX  12/13 ceftriaxone >> 12/16 increase dose 12/13 azithro >> 12/16   SUBJECTIVE/OVERNIGHT/INTERVAL HX    12/18 - doing SBT now (yesterday high Pip after being rested on vent, ? Due to abd distenstion). LAsix held yesterday due to rising Na/Creat. Cards resuming lasix today via PO. Afebrile x 48h after increase in ceftriaxone dose for MSSA PCR +   C/o constipation  Objective   Blood pressure (!) 160/119, pulse 78, temperature 98.1 F (36.7 C), temperature source Axillary, resp. rate 14, height 5\' 11"  (1.803 m), weight 118.2 kg, SpO2 99 %. CVP:  [12 mmHg] 12 mmHg  Vent Mode: PSV;CPAP FiO2 (%):  [40 %] 40 % Set Rate:  [25 bmp] 25 bmp Vt Set:  [620 mL] 620 mL PEEP:  [5 cmH20] 5 cmH20 Pressure Support:  [5 cmH20-10 cmH20] 5 cmH20 Plateau Pressure:  [20 cmH20-22 cmH20] 20 cmH20   Intake/Output Summary (Last 24 hours) at  12/04/2018 0940 Last data filed at 12/04/2018 0900 Gross per 24 hour  Intake 1542.54 ml  Output 1680 ml  Net -137.46 ml   Filed Weights   12/02/18 0500 12/03/18 0500 12/04/18 0500  Weight: 117.3 kg 113.8 kg 118.2 kg   CVP:  [12 mmHg] 12 mmHg   General Appearance: obese, looks better Head:  Normocephalic, without obvious abnormality, atraumatic Eyes:  PERRL - yes, conjunctiva/corneas - clear     Ears:  Normal external ear canals, both ears Nose:  G tube - no Throat:  ETT TUBE - yes , OG tube - yes Neck:  Supple,  No enlargement/tenderness/nodules Lungs: Clear to auscultation bilaterally, Ventilator   Synchrony - yes Heart:  S1 and S2 normal, no murmur, CVP - no.  Pressors - no Abdomen:  Soft, no masses, no organomegaly Genitalia / Rectal:  Not done Extremities:  Extremities- intact Skin:  ntact in exposed areas . Sacral area - not examined Neurologic:  Sedation - none -> RASS - +1 . Moves all 4s - yes. CAM-ICU - neg . Orientation - x3+ . Writing on board     [place]  Lab 11/29/18 0859 11/29/18 1004 11/29/18 1313 11/30/18 0435 11/30/18 1538  PHART 7.264* 7.143* 7.321* 7.463*  --   PCO2ART 51.2* 76.6* 44.8 33.4  --   PO2ART 61.0* 67.0* 85.0 98.9  --   HCO3 23.2 26.3 23.0 23.3  --   TCO2 25 29 24   --   --  O2SAT 87.0 85.0 95.0 97.4 64.8    CBC Recent Labs  Lab 11/30/18 0435 11/30/18 1019 12/03/18 0325 12/04/18 0455  HGB 12.5* 12.2* 11.8* 11.9*  HCT 40.5 38.2* 37.8* 39.0  WBC 15.6*  --  12.4* 11.6*  PLT 195  --  211 216    COAGULATION No results for input(s): INR in the last 168 hours.  CARDIAC   Recent Labs  Lab 11/29/18 0807 11/29/18 1249 11/29/18 1643 11/29/18 2303 11/30/18 1019  TROPONINI 0.15* 0.25* 0.24* 0.25* 0.28*   No results for input(s): PROBNP in the last 168 hours.   CHEMISTRY Recent Labs  Lab 11/30/18 0435 11/30/18 1749 12/01/18 0525 12/01/18 1658 12/02/18 0334 12/03/18 0325 12/04/18 0455  NA 142  142 142 142 146* 146* 149*  K 4.0 3.9 3.7 3.3* 3.4* 3.5 3.6  CL 106 105 104 106 108 106 112*  CO2 24 26 25 25 26 26 25   GLUCOSE 121* 125* 122* 125* 115* 118* 132*  BUN 21* 24* 32* 33* 36* 40* 37*  CREATININE 1.87* 1.82* 1.65* 1.54* 1.65* 1.71* 1.41*  CALCIUM 8.4* 8.7* 8.7* 8.6* 8.8* 9.0 9.4  MG 2.1 2.0 2.4 2.2  --  2.6*  --   PHOS 3.4 2.4* 2.5 3.5  --  4.4 3.8   Estimated Creatinine Clearance: 75.6 mL/min (A) (by C-G formula based on SCr of 1.41 mg/dL (H)).   LIVER Recent Labs  Lab 11/29/18 0807 11/30/18 0435 12/03/18 0325 12/04/18 0455  AST 44* 22  --   --   ALT 25 21  --   --   ALKPHOS 99 71  --   --   BILITOT 1.5* 1.6*  --   --   PROT 8.5* 6.0*  --   --   ALBUMIN 4.0 2.9* 3.0* 2.9*     INFECTIOUS Recent Labs  Lab 11/29/18 1248  11/30/18 0435 12/01/18 0525 12/03/18 0325  LATICACIDVEN 1.6  --   --   --   --   PROCALCITON  --    < > 6.59 5.29 1.97   < > = values in this interval not displayed.     ENDOCRINE CBG (last 3)  Recent Labs    12/04/18 0000 12/04/18 0505 12/04/18 0807  GLUCAP 131* 116* 146*         IMAGING x48h  - image(s) personally visualized  -   highlighted in bold Dg Abd 1 View  Result Date: 12/03/2018 CLINICAL DATA:  OG tube placement. EXAM: ABDOMEN - 1 VIEW COMPARISON:  Abdominal x-ray dated November 29, 2018. FINDINGS: Enteric tube terminating in the stomach near the gastric antrum. Endotracheal tube at the thoracic inlet. Right internal jugular central venous catheter with tip at the cavoatrial junction. Left basilar atelectasis. IMPRESSION: 1. Enteric tube in the stomach. Electronically Signed   By: Titus Dubin M.D.   On: 12/03/2018 21:24   Dg Chest Port 1 View  Result Date: 12/04/2018 CLINICAL DATA:  Hypoxia EXAM: PORTABLE CHEST 1 VIEW COMPARISON:  12/03/2018 FINDINGS: Cardiomegaly. Prior valve replacement. Support devices are stable. No effusions or edema. No confluent airspace opacities. IMPRESSION: Cardiomegaly.  No acute  findings. Electronically Signed   By: Rolm Baptise M.D.   On: 12/04/2018 08:01   Dg Chest Port 1 View  Result Date: 12/03/2018 CLINICAL DATA:  57 year old male with a history of endotracheal tube and respiratory failure EXAM: PORTABLE CHEST 1 VIEW COMPARISON:  11/30/2018, 11/29/2018 FINDINGS: Cardiomediastinal silhouette unchanged. Surgical changes of median sternotomy and aortic valve repair. Endotracheal  tube remains, terminating at the level of the clavicular heads approximately 6.3 cm above the carina. Unchanged right IJ central venous catheter. Endotracheal tube traverses the mediastinum and terminates out of the field of view in the left abdomen. Persisting low lung volumes with no pneumothorax or new pleural effusion. Mild coarsened interstitial markings without interlobular septal thickening. Overall, aeration appears improved from the prior chest x-ray IMPRESSION: Improving aeration compared to the prior plain film, with no new pleural effusion or airspace opacity. Unchanged endotracheal tube, right IJ central venous catheter, gastric tube. Surgical changes of median sternotomy and aortic valve repair Electronically Signed   By: Corrie Mckusick D.O.   On: 12/03/2018 08:39    Assessment & Plan:  Critically ill due to acute respiratory failure with hypoxia and hypercarbia and CAP requiring mechanical ventilation.   12/04/2018  - meets extubation criteria   P:  Extubate  Pulm toilet O2  Shock- presumed cardiogenic +/- component of sepsis- resolved   P:  Tele monitoring Goal MAP >65  Acute on chronic diastolic heart failure, hx AVR  - TTE LVEF 50-55% with mod to severe pulm HTN- PASP 56 mmHg, BNP 1200 - resumed lasix 12/04/2018 after 1d hold by cards  P:  - per cards  AKI - improved 12/04/2018 after lasix hold for 1 days  P:  Monitor with lasix Dc foley  CAP/Leukocytosis/ fever/  - from MSSA (PCR +, culture +)  - afebriled x 24-48h after increased ceftriaxone dose on  12/02/18   P:  Aim for po 12/05/18\ and recommend 7d from 12/02/18 when he really responded to abx   Hyperglycemia P:  Trend CBG q 4,  SSI   COnstipation  - mirlax and suppositiory and monitor  Best practice:  Diet: npo post extubation and advance after 4h Pain/Anxiety/Delirium protocol (if indicated): none VAP protocol (if indicated): In place DVT prophylaxis: Lovenox GI prophylaxis: Protonix  Glucose control: Adequate control with subcu insulin Mobility: Bedrest but post extubation mobilize Code Status: Full Family Communication: patient Disposition: ICU -> SDU by end of day 12/04/2018 if  welll post extgubation (d/w Dr Maren Beach 10:46 AM 12/04/2018 - TRH primary and ccm off 12/05/18)   ATTESTATION & SIGNATURE   The patient ALWIN LANIGAN is critically ill with multiple organ systems failure and requires high complexity decision making for assessment and support, frequent evaluation and titration of therapies, application of advanced monitoring technologies and extensive interpretation of multiple databases.   Critical Care Time devoted to patient care services described in this note is  30  Minutes. This time reflects time of care of this signee Dr Brand Males. This critical care time does not reflect procedure time, or teaching time or supervisory time of PA/NP/Med student/Med Resident etc but could involve care discussion time     Dr. Brand Males, M.D., [facility].C.P Pulmonary and Critical Care Medicine Staff Physician [place] [facility] Pager: [phone], If no answer or between  15:00h - 7:00h: call [phone]  12/04/2018 9:41 AM

## 2018-12-04 NOTE — Progress Notes (Signed)
Progress Note  Patient Name: [name] Date of Encounter: 12/04/2018  Primary Cardiologist: [name], MD   Subjective   Intubated.  Denies chest pain.  Anxious to get his endotracheal tube removed.  Concerned about constipation.  Inpatient Medications    Scheduled Meds: . aspirin  325 mg Per Tube Daily  . chlorhexidine gluconate (MEDLINE KIT)  15 mL Mouth Rinse BID  . Chlorhexidine Gluconate Cloth  6 each Topical Daily  . enoxaparin (LOVENOX) injection  40 mg Subcutaneous Q24H  . feeding supplement (PRO-STAT SUGAR FREE 64)  30 mL Per Tube TID  . insulin aspart  0-15 Units Subcutaneous Q4H  . mouth rinse  15 mL Mouth Rinse 10 times per day  . mupirocin ointment  1 application Nasal BID  . pantoprazole sodium  40 mg Per Tube Daily  . polyethylene glycol  17 g Oral Daily  . sodium bicarbonate  25 mEq Intravenous Once  . sodium chloride flush  10-40 mL Intracatheter Q12H   Continuous Infusions: . cefTRIAXone (ROCEPHIN)  IV Stopped (12/03/18 1004)  . feeding supplement (VITAL HIGH PROTEIN) 1,000 mL (12/03/18 1508)   PRN Meds: acetaminophen (TYLENOL) oral liquid 160 mg/5 mL, bisacodyl, fentaNYL, sodium chloride flush   Vital Signs    Vitals:   12/04/18 0730 12/04/18 0800 12/04/18 0819 12/04/18 0820  BP:  (!) 143/84 (!) 143/84   Pulse: (!) 46 (!) 43 (!) 48   Resp: (!) 25 (!) 25 (!) 25   Temp: 98.1 F (36.7 C)     TempSrc: Axillary     SpO2: 98% 100% 100% 98%  Weight:      Height:        Intake/Output Summary (Last 24 hours) at 12/04/2018 0835 Last data filed at 12/04/2018 0800 Gross per 24 hour  Intake 1645.71 ml  Output 1695 ml  Net -49.29 ml   Filed Weights   12/02/18 0500 12/03/18 0500 12/04/18 0500  Weight: 117.3 kg 113.8 kg 118.2 kg    Telemetry    Sinus bradycardia with first-degree and junctional bradycardia to 40-60s.  Sinus tachycardia.- Personally Reviewed  ECG    N/a  - Personally Reviewed  Physical Exam   VS:  BP (!) 143/84    Pulse (!) 48   Temp 98.1 F (36.7 C) (Axillary)   Resp (!) 25   Ht '5\' 11"'$  (1.803 m)   Wt 118.2 kg   SpO2 98%   BMI 36.34 kg/m  , BMI Body mass index is 36.34 kg/m.  CVP 12 GENERAL:  On ventilator and awake.  Comfortable. HEENT: Pupils equal round and reactive, fundi not visualized, oral mucosa unremarkable NECK: JVP difficult to assess, waveform within normal limits, carotid upstroke brisk and symmetric, no bruits LUNGS:  Clear to auscultation bilaterally HEART:  RRR.  PMI not displaced or sustained,S1 and S2 within normal limits, no S3, no S4, no clicks, no rubs, III/VI systolic murmur at the LUSB ABD:  Flat, positive bowel sounds normal in frequency in pitch, no bruits, no rebound, no guarding, no midline pulsatile mass, no hepatomegaly, no splenomegaly EXT:  2 plus pulses throughout, no edema, no cyanosis no clubbing SKIN:  No rashes no nodules NEURO:  Moves all 4 extremities freely PSYCH:  Unable to assess   Labs    Chemistry Recent Labs  Lab 11/29/18 0807 11/30/18 0435  12/02/18 0334 12/03/18 0325 12/04/18 0455  NA 140 142   < > 146* 146* 149*  K 4.2 4.0   < >  3.4* 3.5 3.6  CL 102 106   < > 108 106 112*  CO2 19* 24   < > '26 26 25  '$ GLUCOSE 271* 121*   < > 115* 118* 132*  BUN 15 21*   < > 36* 40* 37*  CREATININE 1.86* 1.87*   < > 1.65* 1.71* 1.41*  CALCIUM 9.2 8.4*   < > 8.8* 9.0 9.4  PROT 8.5* 6.0*  --   --   --   --   ALBUMIN 4.0 2.9*  --   --  3.0* 2.9*  AST 44* 22  --   --   --   --   ALT 25 21  --   --   --   --   ALKPHOS 99 71  --   --   --   --   BILITOT 1.5* 1.6*  --   --   --   --   GFRNONAA 39* 39*   < > 45* 43* 55*  GFRAA 46* 45*   < > 53* 50* >60  ANIONGAP 19* 12   < > '12 14 12   '$ < > = values in this interval not displayed.     Hematology Recent Labs  Lab 11/30/18 0435 11/30/18 1019 12/03/18 0325 12/04/18 0455  WBC 15.6*  --  12.4* 11.6*  RBC 4.73  --  4.40 4.48  HGB 12.5* 12.2* 11.8* 11.9*  HCT 40.5 38.2* 37.8* 39.0  MCV 85.6  --  85.9  87.1  MCH 26.4  --  26.8 26.6  MCHC 30.9  --  31.2 30.5  RDW 14.6  --  14.6 14.6  PLT 195  --  211 216    Cardiac Enzymes Recent Labs  Lab 11/29/18 1249 11/29/18 1643 11/29/18 2303 11/30/18 1019  TROPONINI 0.25* 0.24* 0.25* 0.28*   No results for input(s): TROPIPOC in the last 168 hours.   BNP Recent Labs  Lab 11/29/18 0808  BNP 1,232.9*     DDimer No results for input(s): DDIMER in the last 168 hours.   Radiology    Dg Abd 1 View  Result Date: 12/03/2018 CLINICAL DATA:  OG tube placement. EXAM: ABDOMEN - 1 VIEW COMPARISON:  Abdominal x-ray dated November 29, 2018. FINDINGS: Enteric tube terminating in the stomach near the gastric antrum. Endotracheal tube at the thoracic inlet. Right internal jugular central venous catheter with tip at the cavoatrial junction. Left basilar atelectasis. IMPRESSION: 1. Enteric tube in the stomach. Electronically Signed   By: Titus Dubin M.D.   On: 12/03/2018 21:24   Dg Chest Port 1 View  Result Date: 12/04/2018 CLINICAL DATA:  Hypoxia EXAM: PORTABLE CHEST 1 VIEW COMPARISON:  12/03/2018 FINDINGS: Cardiomegaly. Prior valve replacement. Support devices are stable. No effusions or edema. No confluent airspace opacities. IMPRESSION: Cardiomegaly.  No acute findings. Electronically Signed   By: Rolm Baptise M.D.   On: 12/04/2018 08:01   Dg Chest Port 1 View  Result Date: 12/03/2018 CLINICAL DATA:  57 year old male with a history of endotracheal tube and respiratory failure EXAM: PORTABLE CHEST 1 VIEW COMPARISON:  11/30/2018, 11/29/2018 FINDINGS: Cardiomediastinal silhouette unchanged. Surgical changes of median sternotomy and aortic valve repair. Endotracheal tube remains, terminating at the level of the clavicular heads approximately 6.3 cm above the carina. Unchanged right IJ central venous catheter. Endotracheal tube traverses the mediastinum and terminates out of the field of view in the left abdomen. Persisting low lung volumes with no  pneumothorax or new pleural effusion. Mild coarsened  interstitial markings without interlobular septal thickening. Overall, aeration appears improved from the prior chest x-ray IMPRESSION: Improving aeration compared to the prior plain film, with no new pleural effusion or airspace opacity. Unchanged endotracheal tube, right IJ central venous catheter, gastric tube. Surgical changes of median sternotomy and aortic valve repair Electronically Signed   By: Corrie Mckusick D.O.   On: 12/03/2018 08:39    Cardiac Studies   Echo 11/29/18:  Study Conclusions  - Left ventricle: The cavity size was normal. Wall thickness was   increased in a pattern of severe LVH. Systolic function was   normal. The estimated ejection fraction was in the range of 50%   to 55%. Mild hypokinesis of the anteroseptal myocardium. Doppler   parameters are consistent with abnormal left ventricular   relaxation (grade 1 diastolic dysfunction). - Aortic valve: A prosthesis was present and functioning normally.   The prosthesis had a normal range of motion. The sewing ring   appeared normal, had no rocking motion, and showed no evidence of   dehiscence. There was mild regurgitation. Mean gradient (S): 10   mm Hg. Valve area (VTI): 1.69 cm^2. Valve area (Vmax): 1.63 cm^2.   Valve area (Vmean): 1.61 cm^2. - Left atrium: The atrium was severely dilated. - Right ventricle: The cavity size was mildly dilated. Wall   thickness was normal. Systolic function was moderately reduced. - Right atrium: The atrium was moderately to severely dilated. - Atrial septum: The septum bowed from right to left, consistent   with increased right atrial pressure. - Pulmonary arteries: Systolic pressure was moderately to severely   increased. PA peak pressure: 56 mm Hg (S).  Impressions:  - Severe LVH. Moderate to severe pulmonary hypertension. Aortic   valve stable with mild regurgitation.  Patient Profile     57 y.o. male with  bioprosthetic aortic valve, morbid obesity, here with acute on chronic diastolic heart failure, hypoxic respiratory failure requiring intubation and possible pneumonia.    Assessment & Plan    # Acute on chronic diastolic heart failure: # Hypoxic/hypercarbic respiratory failure: #? CAP: Echo this admission revealed LVEF 50-55%.  BNP was 1200.  Lasix was held yesterday due to worsening renal function and normal CVP.  CVP today is 12.  We will resume Lasix 40 mg p.o. daily.  Currently on Ceftriaxone and azithromycin for likely MSSA pneumonia.    # AKI: Baseline creatinine is around 1.3.  Creatinine down to 1.4 after holding lasix yesterday.  Resume Lasix 40 mg p.o. as above.  # Cardiogenic/septic shock: Resolved.  Co-ox was 64.8 on 12/14.   # Hypertension: BP ranging 112-150s/70-80s.  He is on lisinopril at home.  Start amlodipine 2.5 mg p.o. daily.  # Abnormal EKG:  # Elevated troponin: Initially there was concern for STEMI.  However, relatively unchanged from prior. Troponin elevated to 0.28 and flat.  Consistent with demand ischemia.  He denies chest pain.   # s/p AVR: Valve functioning properly on echo this admission.  Mean gradient 10 mmHg.  Mild AR.  # Pulmonary Hypertension: Mod-severe.  PASP 56 mmHg.  Diuresis as above. 3  Time spent: 30 minutes-Greater than 50% of this time was spent in counseling, explanation of diagnosis, planning of further management, and coordination of care.      For questions or updates, please contact [place] Please consult [email or web address] for contact info under        Signed, Skeet Latch, MD  12/04/2018, 8:35 AM

## 2018-12-04 NOTE — Procedures (Signed)
Extubation Procedure Note  Patient Details:   Name: MAXMILIAN TROSTEL DOB: [birth year] MRN: [id number]   Airway Documentation:  + cuff leak test prior to extubation.   Vent end date: 12/04/18 Vent end time: 1015   Evaluation  O2 sats: stable throughout Complications: No apparent complications Patient did tolerate procedure well. Bilateral Breath Sounds: Diminished, Clear   Yes pt able to speak, no stridor noted.  No distress noted.   Lenna Sciara 12/04/2018, 10:26 AM

## 2018-12-05 DIAGNOSIS — J189 Pneumonia, unspecified organism: Secondary | ICD-10-CM

## 2018-12-05 LAB — CBC WITH DIFFERENTIAL/PLATELET
Abs Immature Granulocytes: 0.06 10*3/uL (ref 0.00–0.07)
BASOS PCT: 1 %
Basophils Absolute: 0.1 10*3/uL (ref 0.0–0.1)
Eosinophils Absolute: 0.3 10*3/uL (ref 0.0–0.5)
Eosinophils Relative: 2 %
HCT: 40.7 % (ref 39.0–52.0)
Hemoglobin: 12.3 g/dL — ABNORMAL LOW (ref 13.0–17.0)
Immature Granulocytes: 1 %
Lymphocytes Relative: 16 %
Lymphs Abs: 2 10*3/uL (ref 0.7–4.0)
MCH: 26.3 pg (ref 26.0–34.0)
MCHC: 30.2 g/dL (ref 30.0–36.0)
MCV: 87.2 fL (ref 80.0–100.0)
Monocytes Absolute: 0.8 10*3/uL (ref 0.1–1.0)
Monocytes Relative: 7 %
Neutro Abs: 8.9 10*3/uL — ABNORMAL HIGH (ref 1.7–7.7)
Neutrophils Relative %: 73 %
PLATELETS: 233 10*3/uL (ref 150–400)
RBC: 4.67 MIL/uL (ref 4.22–5.81)
RDW: 14.5 % (ref 11.5–15.5)
WBC: 12.1 10*3/uL — ABNORMAL HIGH (ref 4.0–10.5)
nRBC: 0 % (ref 0.0–0.2)

## 2018-12-05 LAB — BASIC METABOLIC PANEL
Anion gap: 12 (ref 5–15)
BUN: 28 mg/dL — ABNORMAL HIGH (ref 6–20)
CO2: 25 mmol/L (ref 22–32)
Calcium: 9.3 mg/dL (ref 8.9–10.3)
Chloride: 105 mmol/L (ref 98–111)
Creatinine, Ser: 1.51 mg/dL — ABNORMAL HIGH (ref 0.61–1.24)
GFR calc Af Amer: 59 mL/min — ABNORMAL LOW (ref >60)
GFR, EST NON AFRICAN AMERICAN: 51 mL/min — AB (ref >60)
Glucose, Bld: 162 mg/dL — ABNORMAL HIGH (ref 70–99)
Potassium: 3.7 mmol/L (ref 3.5–5.1)
Sodium: 142 mmol/L (ref 135–145)

## 2018-12-05 LAB — GLUCOSE, CAPILLARY
GLUCOSE-CAPILLARY: 102 mg/dL — AB (ref 70–99)
GLUCOSE-CAPILLARY: 134 mg/dL — AB (ref 70–99)
Glucose-Capillary: 92 mg/dL (ref 70–99)
Glucose-Capillary: 104 mg/dL — ABNORMAL HIGH (ref 70–99)
Glucose-Capillary: 97 mg/dL (ref 70–99)

## 2018-12-05 LAB — MAGNESIUM: Magnesium: 2.3 mg/dL (ref 1.7–2.4)

## 2018-12-05 LAB — PHOSPHORUS: Phosphorus: 5 mg/dL — ABNORMAL HIGH (ref 2.5–4.6)

## 2018-12-05 MED ORDER — HEPARIN SODIUM (PORCINE) 5000 UNIT/ML IJ SOLN
5000.0000 [IU] | Freq: Three times a day (TID) | INTRAMUSCULAR | Status: DC
  Administered 2018-12-05 – 2018-12-09 (×13): 5000 [IU] via SUBCUTANEOUS
  Filled 2018-12-05 (×14): qty 1

## 2018-12-05 MED ORDER — AMLODIPINE BESYLATE 5 MG PO TABS
5.0000 mg | ORAL_TABLET | Freq: Every day | ORAL | Status: DC
  Administered 2018-12-05: 5 mg via ORAL

## 2018-12-05 MED ORDER — PANTOPRAZOLE SODIUM 40 MG PO TBEC
40.0000 mg | DELAYED_RELEASE_TABLET | Freq: Every day | ORAL | Status: DC
  Administered 2018-12-05 – 2018-12-10 (×6): 40 mg via ORAL
  Filled 2018-12-05 (×6): qty 1

## 2018-12-05 MED ORDER — AMLODIPINE BESYLATE 10 MG PO TABS
10.0000 mg | ORAL_TABLET | Freq: Every day | ORAL | Status: DC
  Administered 2018-12-06 – 2018-12-10 (×5): 10 mg via ORAL
  Filled 2018-12-05 (×5): qty 1

## 2018-12-05 MED ORDER — HYDRALAZINE HCL 20 MG/ML IJ SOLN
10.0000 mg | Freq: Four times a day (QID) | INTRAMUSCULAR | Status: DC | PRN
  Filled 2018-12-05: qty 1

## 2018-12-05 MED ORDER — ASPIRIN EC 325 MG PO TBEC
325.0000 mg | DELAYED_RELEASE_TABLET | Freq: Every day | ORAL | Status: DC
  Administered 2018-12-05 – 2018-12-10 (×6): 325 mg via ORAL
  Filled 2018-12-05 (×6): qty 1

## 2018-12-05 MED ORDER — AMLODIPINE BESYLATE 5 MG PO TABS
5.0000 mg | ORAL_TABLET | Freq: Every day | ORAL | Status: DC
  Filled 2018-12-05: qty 1

## 2018-12-05 MED ORDER — BENZONATATE 100 MG PO CAPS
100.0000 mg | ORAL_CAPSULE | Freq: Three times a day (TID) | ORAL | Status: DC | PRN
  Administered 2018-12-05 – 2018-12-06 (×2): 100 mg via ORAL
  Filled 2018-12-05 (×2): qty 1

## 2018-12-05 MED ORDER — POTASSIUM CHLORIDE CRYS ER 20 MEQ PO TBCR
20.0000 meq | EXTENDED_RELEASE_TABLET | ORAL | Status: AC
  Administered 2018-12-05 (×2): 20 meq via ORAL
  Filled 2018-12-05 (×2): qty 1

## 2018-12-05 NOTE — Progress Notes (Signed)
Nutrition Follow-up  DOCUMENTATION CODES:   Obesity unspecified  INTERVENTION:    Continue Regular diet  NEW NUTRITION DIAGNOSIS:   Increased nutrient needs related to acute illness as evidenced by estimated needs, ongoing  GOAL:   Patient will meet greater than or equal to 90% of their needs, met  MONITOR:   PO intake, Labs, Skin, Weight trends, I & O's  ASSESSMENT:   Patient with PMH significant for atrial flutter, bicuspid aortic valve with stenosis s/p AVR, HTN, CHF, DM, and CKD III.  Presents this admission as code STEMI (canceled upon arrival). Intubated in ED due to respiratory failure likely secondary to CHF exacerbation.   12/18 extubated, TF discontinued via OGT  Pt sleeping soundly upon RD visit. S/p bedside swallow evaluation this AM. Advanced to a Regular diet.  Lunch meal untouched on tray table. RN reports pt ate well at breakfast this AM. Labs & medications reviewed. P 5.0 (H). CBG's [id number].  Diet Order:   Diet Order            Diet regular Room service appropriate? Yes; Fluid consistency: Thin  Diet effective now             EDUCATION NEEDS:   Not appropriate for education at this time  Skin:  Skin Assessment: Reviewed RN Assessment  Last BM:  12/19   Intake/Output Summary (Last 24 hours) at 12/05/2018 1539 Last data filed at 12/05/2018 1419 Gross per 24 hour  Intake 2588 ml  Output 326 ml  Net 2262 ml   Height:   Ht Readings from Last 1 Encounters:  11/29/18 '5\' 11"'$  (1.803 m)   Weight:   Wt Readings from Last 1 Encounters:  12/04/18 118.2 kg   Ideal Body Weight:  78.2 kg  BMI:  Body mass index is 36.34 kg/m.  Estimated Nutritional Needs:   Kcal:  2300-2500  Protein:  120-135 gm  Fluid:  2.3-2.5 L  Arthur Holms, RD, LDN Pager #: [phone] After-Hours Pager #: [phone]

## 2018-12-05 NOTE — Progress Notes (Signed)
150 mls (25mcg/ml) of IV fentanyl wasted with Beckie Salts, RN. Med wasted in Stericycle container.  Joellen Jersey, RN

## 2018-12-05 NOTE — Progress Notes (Signed)
PROGRESS NOTE  [name]:[id number] DOB: 05/12/1961 DOA: 11/29/2018 PCP: No primary care provider on file.  HPI/Recap of past 24 hours: 57yo male with PMH of atrial flutter, bicuspid aortic valve with stenosis s/p AVR, HTN, sCHF, CKD presenting to [facility] via EMS as code STEMI. Developed acute shortness of breath while driving his car.  Rapidly progressed to intubation on arrival.  Initially thought to have had STEMI but comparison of previous ECGs did not support this.    Antibiotics started for presumed community-acquired pneumonia.  Extubated on 12/04/2018.  Transferred to [facility] services on 12/05/2018.  12/05/2018: Patient seen and examined at his bedside.  No acute events overnight.  Denies any chest pain, dyspnea, or palpitations.  Constipation is resolving.  Blood pressure remains elevated, medications being adjusted.  Assessment/Plan: Active Problems:   S/P AVR   Respiratory failure (HCC)   Shock circulatory (HCC)   Hypoxia  Acute hypoxic hypercarbic respiratory failure requiring invasive mechanical ventilation secondary to MSSA community-acquired pneumonia versus acute on chronic diastolic CHF Extubated on 12/04/2018 On Rocephin day #3 On cardiac medications and strict I's and O's Afebrile Continue nebs Home O2 evaluation prior to discharge Independent reviewed chest x-ray done during this admission which revealed bilateral lower lobe infiltrates versus atelectasis.  MSSA community-acquired pneumonia Management as stated above Procalcitonin 1.93 on 12/03/2018  Acute on chronic diastolic CHF Last 2D echo done on 12/16/2018 revealed LVEF 50 to 55% Continue current medications Continue strict I's and O's  AKI on CKD 2 Baseline creatinine appears to be 1.41 with GFR greater than 60 Presented with creatinine of 1.65 and GFR 53 Creatinine on 12/05/2018 1.51 with GFR 59 Avoid nephrotoxic agents/dehydration/hypotension Replete BMP in the morning  Uncontrolled  hypertension Amlodipine restarted and increased dose to 10 mg daily Continue to monitor vital signs  Hypophosphatemia Phosphorus 5.0 Repeat level in the morning   Code Status: Full code  Family Communication: None at bedside  Disposition Plan: Home possibly in 1-2 days when cardiology signs off   Consultants:  PCCM  Cardiology  Procedures:  Extubation 12/04/2018  Antimicrobials:  Rocephin  DVT prophylaxis: Subcu heparin 3 times daily   Objective: Vitals:   12/05/18 1003 12/05/18 1100 12/05/18 1138 12/05/18 1200  BP: (!) 149/127 (!) 155/81  (!) 155/117  Pulse:  (!) 59  68  Resp:  (!) 23  (!) 22  Temp:   99.9 F (37.7 C)   TempSrc:   Oral   SpO2:  94%  96%  Weight:      Height:        Intake/Output Summary (Last 24 hours) at 12/05/2018 1258 Last data filed at 12/05/2018 1100 Gross per 24 hour  Intake 2828 ml  Output 1 ml  Net 2827 ml   Filed Weights   12/02/18 0500 12/03/18 0500 12/04/18 0500  Weight: 117.3 kg 113.8 kg 118.2 kg    Exam:  . General: 57 y.o. year-old male well developed well nourished in no acute distress.  Alert and oriented x3. . Cardiovascular: Regular rate and rhythm with no rubs or gallops.  No thyromegaly or JVD noted.   [name] Respiratory: Clear to auscultation with no wheezes or rales. Good inspiratory effort. . Abdomen: Soft nontender nondistended with normal bowel sounds x4 quadrants. . Musculoskeletal: No lower extremity edema. 2/4 pulses in all 4 extremities. . Skin: No ulcerative lesions noted or rashes, . Psychiatry: Mood is appropriate for condition and setting   Data Reviewed: CBC: Recent Labs  Lab 11/29/18 [phone]  11/30/18 0435 11/30/18 1019 12/03/18 0325 12/04/18 0455 12/05/18 0422  WBC 19.7* 15.6*  --  12.4* 11.6* 12.1*  NEUTROABS 15.1*  --   --   --   --  8.9*  HGB 15.1 12.5* 12.2* 11.8* 11.9* 12.3*  HCT 50.9 40.5 38.2* 37.8* 39.0 40.7  MCV 90.2 85.6  --  85.9 87.1 87.2  PLT 195 195  --  211 216 737    Basic Metabolic Panel: Recent Labs  Lab 11/30/18 1749 12/01/18 0525 12/01/18 1658 12/02/18 0334 12/03/18 0325 12/04/18 0455 12/05/18 0010 12/05/18 0422  NA 142 142 142 146* 146* 149* 142  --   K 3.9 3.7 3.3* 3.4* 3.5 3.6 3.7  --   CL 105 104 106 108 106 112* 105  --   CO2 26 25 25 26 26 25 25   --   GLUCOSE 125* 122* 125* 115* 118* 132* 162*  --   BUN 24* 32* 33* 36* 40* 37* 28*  --   CREATININE 1.82* 1.65* 1.54* 1.65* 1.71* 1.41* 1.51*  --   CALCIUM 8.7* 8.7* 8.6* 8.8* 9.0 9.4 9.3  --   MG 2.0 2.4 2.2  --  2.6*  --   --  2.3  PHOS 2.4* 2.5 3.5  --  4.4 3.8  --  5.0*   GFR: Estimated Creatinine Clearance: 70.6 mL/min (A) (by C-G formula based on SCr of 1.51 mg/dL (H)). Liver Function Tests: Recent Labs  Lab 11/29/18 0807 11/30/18 0435 12/03/18 0325 12/04/18 0455  AST 44* 22  --   --   ALT 25 21  --   --   ALKPHOS 99 71  --   --   BILITOT 1.5* 1.6*  --   --   PROT 8.5* 6.0*  --   --   ALBUMIN 4.0 2.9* 3.0* 2.9*   No results for input(s): LIPASE, AMYLASE in the last 168 hours. No results for input(s): AMMONIA in the last 168 hours. Coagulation Profile: No results for input(s): INR, PROTIME in the last 168 hours. Cardiac Enzymes: Recent Labs  Lab 11/29/18 0807 11/29/18 1249 11/29/18 1643 11/29/18 2303 11/30/18 1019  TROPONINI 0.15* 0.25* 0.24* 0.25* 0.28*   BNP (last 3 results) No results for input(s): PROBNP in the last 8760 hours. HbA1C: No results for input(s): HGBA1C in the last 72 hours. CBG: Recent Labs  Lab 12/04/18 1930 12/04/18 2336 12/05/18 0340 12/05/18 0814 12/05/18 1118  GLUCAP 86 158* 92 102* 134*   Lipid Profile: No results for input(s): CHOL, HDL, LDLCALC, TRIG, CHOLHDL, LDLDIRECT in the last 72 hours. Thyroid Function Tests: No results for input(s): TSH, T4TOTAL, FREET4, T3FREE, THYROIDAB in the last 72 hours. Anemia Panel: No results for input(s): VITAMINB12, FOLATE, FERRITIN, TIBC, IRON, RETICCTPCT in the last 72 hours. Urine  analysis:    Component Value Date/Time   COLORURINE YELLOW 12/02/2018 1540   APPEARANCEUR CLEAR 12/02/2018 1540   LABSPEC 1.012 12/02/2018 1540   PHURINE 7.0 12/02/2018 1540   GLUCOSEU NEGATIVE 12/02/2018 1540   HGBUR NEGATIVE 12/02/2018 1540   BILIRUBINUR NEGATIVE 12/02/2018 1540   KETONESUR NEGATIVE 12/02/2018 1540   PROTEINUR NEGATIVE 12/02/2018 1540   UROBILINOGEN 1.0 04/17/2015 1348   NITRITE NEGATIVE 12/02/2018 1540   LEUKOCYTESUR NEGATIVE 12/02/2018 1540   Sepsis Labs: @LABRCNTIP (procalcitonin:4,lacticidven:4)  ) Recent Results (from the past 240 hour(s))  Surgical pcr screen     Status: Abnormal   Collection Time: 11/29/18 12:45 PM  Result Value Ref Range Status   MRSA, PCR NEGATIVE NEGATIVE Final  Staphylococcus aureus POSITIVE (A) NEGATIVE Final    Comment: (NOTE) The Xpert SA Assay (FDA approved for NASAL specimens in patients 98 years of age and older), is one component of a comprehensive surveillance program. It is not intended to diagnose infection nor to guide or monitor treatment. Performed at [facility], [place] [address] [place], [place] [address]   Culture, respiratory (non-expectorated)     Status: None   Collection Time: 11/30/18 12:43 PM  Result Value Ref Range Status   Specimen Description TRACHEAL ASPIRATE  Final   Special Requests NONE  Final   Gram Stain   Final    RARE WBC PRESENT, PREDOMINANTLY PMN NO ORGANISMS SEEN Performed at [facility], [place] [address], [place], [place] [address]    Culture RARE STAPHYLOCOCCUS AUREUS  Final   Report Status 12/03/2018 FINAL  Final   Organism ID, Bacteria STAPHYLOCOCCUS AUREUS  Final      Susceptibility   Staphylococcus aureus - MIC*    CIPROFLOXACIN <=0.5 SENSITIVE Sensitive     ERYTHROMYCIN <=0.25 SENSITIVE Sensitive     GENTAMICIN <=0.5 SENSITIVE Sensitive     OXACILLIN 0.5 SENSITIVE Sensitive     TETRACYCLINE <=1 SENSITIVE Sensitive     VANCOMYCIN <=0.5 SENSITIVE Sensitive      TRIMETH/SULFA <=10 SENSITIVE Sensitive     CLINDAMYCIN <=0.25 SENSITIVE Sensitive     RIFAMPIN <=0.5 SENSITIVE Sensitive     Inducible Clindamycin NEGATIVE Sensitive     * RARE STAPHYLOCOCCUS AUREUS  Culture, blood (routine x 2)     Status: None (Preliminary result)   Collection Time: 12/02/18  5:53 AM  Result Value Ref Range Status   Specimen Description BLOOD LEFT HAND  Final   Special Requests   Final    BOTTLES DRAWN AEROBIC ONLY Blood Culture adequate volume   Culture   Final    NO GROWTH 3 DAYS Performed at [facility], [place] [address] [place], [place] [address]    Report Status PENDING  Incomplete  Culture, blood (routine x 2)     Status: None (Preliminary result)   Collection Time: 12/02/18  5:53 AM  Result Value Ref Range Status   Specimen Description BLOOD LEFT HAND  Final   Special Requests   Final    BOTTLES DRAWN AEROBIC ONLY Blood Culture adequate volume   Culture   Final    NO GROWTH 3 DAYS Performed at [facility], [place] [address], [place], [place] [address]    Report Status PENDING  Incomplete      Studies: No results found.  Scheduled Meds: . amLODipine  5 mg Oral Daily  . aspirin EC  325 mg Oral Daily  . insulin aspart  0-15 Units Subcutaneous Q4H  . pantoprazole  40 mg Oral Daily  . polyethylene glycol  17 g Oral Daily  . potassium chloride  20 mEq Oral Q4H  . sodium chloride flush  10-40 mL Intracatheter Q12H    Continuous Infusions: . cefTRIAXone (ROCEPHIN)  IV Stopped (12/05/18 1038)     LOS: 6 days     Kayleen Memos, MD [facility] Pager [phone]  If 7PM-7AM, please contact night-coverage [email or web address] Password [email or web address] 12/05/2018, 12:58 PM

## 2018-12-05 NOTE — Evaluation (Signed)
Clinical/Bedside Swallow Evaluation Patient Details  Name: [name] MRN: [id number] Date of Birth: 1961/11/05  Today's Date: 12/05/2018 Time: SLP Start Time (ACUTE ONLY): 0813 SLP Stop Time (ACUTE ONLY): 0830 SLP Time Calculation (min) (ACUTE ONLY): 17 min  Past Medical History:  Past Medical History:  Diagnosis Date  . Aortic stenosis 11/19/2014  . Atrial flutter ([place])   . Benign essential HTN 11/19/2014  . Benign hypertensive heart and renal disease   . CHF (congestive heart failure) ([place])   . Chronic systolic CHF (congestive heart failure) ([place]) 11/19/2014  . Congenital insufficiency of aortic valve   . Congestive cardiomyopathy ([place])   . Congestive dilated cardiomyopathy ([place]) 11/19/2014  . Essential hypertension, malignant   . Insomnia   . Murmur   . SOB (shortness of breath)    Past Surgical History:  Past Surgical History:  Procedure Laterality Date  . AORTIC VALVE REPLACEMENT N/A 04/05/2015   Procedure: AORTIC VALVE REPLACEMENT (AVR) using a 65mm Edwards Aortic Magna Ease Valve ;  Surgeon: [name], MD;  Location: [place];  Service: Open Heart Surgery;  Laterality: N/A;  . LEFT AND RIGHT HEART CATHETERIZATION WITH CORONARY ANGIOGRAM N/A 11/27/2014   Procedure: LEFT AND RIGHT HEART CATHETERIZATION WITH CORONARY ANGIOGRAM;  Surgeon: [name], MD;  Location: [facility] CATH LAB;  Service: Cardiovascular;  Laterality: N/A;  . MAZE N/A 04/05/2015   Procedure: MAZE;  Surgeon: [name], MD;  Location: [place];  Service: Open Heart Surgery;  Laterality: N/A;  . REPLACEMENT ASCENDING AORTA N/A 04/05/2015   Procedure: REPLACEMENT ASCENDING AORTA with a 54mm Hemashield Platinum Graft;  Surgeon: [name], MD;  Location: [place];  Service: Open Heart Surgery;  Laterality: N/A;  . 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 Seen by ST services 2016 with FEES rec'd NPO moved to Dys 3, honey thick,  MBS rec'd regular/thin. Intubated 12/13-12/18. CXR Cardiomegaly.  No acute findings. Per chart initial workup has found a leukocytosis, asymmetric R>L pulmonary infiltrates worsened on re-evaluation.    Assessment / Plan / Recommendation Clinical Impression  Pt alert, good historian and stated precautions learned from [place] in 2016 that he implements when needed (left head turn).  Consumed 3 oz water without s/s aspiration. Verbal cues needed for smaller bites with graham cracker. Vocal quality intermittently hoarse and able to produce strong cough when cued. Upgrade to regular texture today, continue thin. No follow up needed at this time.  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 Seen by ST services 2016 with FEES rec'd NPO moved to Dys 3, honey thick,  MBS rec'd regular/thin. Intubated 12/13-12/18. CXR Cardiomegaly.  No acute findings. Per chart initial workup has found a leukocytosis, asymmetric R>L pulmonary  infiltrates worsened on re-evaluation.  Type of Study: Bedside Swallow Evaluation Previous Swallow Assessment: (see HPI) Diet Prior to this Study: Thin liquids(clears) Temperature Spikes Noted: No Respiratory Status: Nasal cannula History of Recent Intubation: Yes Length of Intubations (days): 6 days Date extubated: 12/04/18 Behavior/Cognition: Alert;Cooperative;Pleasant mood Oral Cavity Assessment: Within Functional Limits Oral Care Completed by SLP: No Oral Cavity - Dentition: Adequate natural dentition Vision: Functional for self-feeding Self-Feeding Abilities: Able to feed self Patient Positioning: Upright in bed Baseline Vocal Quality: Hoarse Volitional Cough: Strong Volitional Swallow: Able to elicit    Oral/Motor/Sensory Function Overall Oral Motor/Sensory Function: Within functional limits   Ice Chips Ice chips: Not tested   Thin Liquid Thin Liquid: Within functional limits Presentation: Straw;Cup    Nectar Thick Nectar Thick Liquid: Not tested   Honey Thick Honey Thick Liquid: Not tested   Puree Puree: Not tested   Solid     Solid: Within functional limits      Houston Siren 12/05/2018,9:01 AM   Orbie Pyo Colvin Caroli.Ed Risk analyst [phone] Office [phone]

## 2018-12-05 NOTE — Progress Notes (Signed)
Progress Note  Patient Name: [name] Date of Encounter: 12/05/2018  Primary Cardiologist: [name], MD   Subjective   Feeling very weak.  Walked with PT.  Happy to be extubated.   Inpatient Medications    Scheduled Meds: . amLODipine  5 mg Per Tube Daily  . aspirin  325 mg Per Tube Daily  . furosemide  40 mg Per Tube Daily  . insulin aspart  0-15 Units Subcutaneous Q4H  . pantoprazole sodium  40 mg Per Tube Daily  . polyethylene glycol  17 g Oral Daily  . potassium chloride  20 mEq Oral Q4H  . sodium chloride flush  10-40 mL Intracatheter Q12H   Continuous Infusions: . cefTRIAXone (ROCEPHIN)  IV Stopped (12/04/18 1901)   PRN Meds: benzonatate, bisacodyl, fentaNYL, hydrALAZINE, sodium chloride flush   Vital Signs    Vitals:   12/05/18 0400 12/05/18 0500 12/05/18 0600 12/05/18 0820  BP: (!) 146/79 (!) 140/117 (!) 169/102   Pulse: (!) 48 (!) 55 (!) 51   Resp: (!) 21 (!) 23 (!) 23   Temp: 99.1 F (37.3 C)   98.6 F (37 C)  TempSrc: Oral   Axillary  SpO2: 94% (!) 87% 97%   Weight:      Height:        Intake/Output Summary (Last 24 hours) at 12/05/2018 0855 Last data filed at 12/05/2018 0600 Gross per 24 hour  Intake 3078.07 ml  Output 146 ml  Net 2932.07 ml   Filed Weights   12/02/18 0500 12/03/18 0500 12/04/18 0500  Weight: 117.3 kg 113.8 kg 118.2 kg    Telemetry    Sinus bradycardia with first-degree and junctional bradycardia to 40-60s. Ventricular escape beats.  - Personally Reviewed  ECG    N/a  - Personally Reviewed  Physical Exam   VS:  BP (!) 169/102   Pulse (!) 51   Temp 98.6 F (37 C) (Axillary)   Resp (!) 23   Ht 5\' 11"  (1.803 m)   Wt 118.2 kg   SpO2 97%   BMI 36.34 kg/m  , BMI Body mass index is 36.34 kg/m. GENERAL:  Well appearing HEENT: Pupils equal round and reactive, fundi not visualized, oral mucosa unremarkable NECK:  No jugular venous distention, waveform within normal limits, carotid upstroke brisk and  symmetric, no bruits LUNGS:  Clear to auscultation bilaterally HEART:  RRR.  PMI not displaced or sustained,S1 and S2 within normal limits, no S3, no S4, no clicks, no rubs, III/VI systolic murmur at the LUSB ABD:  Flat, positive bowel sounds normal in frequency in pitch, no bruits, no rebound, no guarding, no midline pulsatile mass, no hepatomegaly, no splenomegaly EXT:  2 plus pulses throughout, no edema, no cyanosis no clubbing SKIN:  No rashes no nodules NEURO:  Cranial nerves II through XII grossly intact, motor grossly intact throughout [facility]:  Cognitively intact, oriented to person place and time   [place]  Lab 11/29/18 0807 11/30/18 0435  12/03/18 0325 12/04/18 0455 12/05/18 0010  NA 140 142   < > 146* 149* 142  K 4.2 4.0   < > 3.5 3.6 3.7  CL 102 106   < > 106 112* 105  CO2 19* 24   < > 26 25 25   GLUCOSE 271* 121*   < > 118* 132* 162*  BUN 15 21*   < > 40* 37* 28*  CREATININE 1.86* 1.87*   < > 1.71* 1.41* 1.51*  CALCIUM 9.2 8.4*   < > 9.0 9.4 9.3  PROT 8.5* 6.0*  --   --   --   --   ALBUMIN 4.0 2.9*  --  3.0* 2.9*  --   AST 44* 22  --   --   --   --   ALT 25 21  --   --   --   --   ALKPHOS 99 71  --   --   --   --   BILITOT 1.5* 1.6*  --   --   --   --   GFRNONAA 39* 39*   < > 43* 55* 51*  GFRAA 46* 45*   < > 50* >60 59*  ANIONGAP 19* 12   < > 14 12 12    < > = values in this interval not displayed.     Hematology Recent Labs  Lab 12/03/18 0325 12/04/18 0455 12/05/18 0422  WBC 12.4* 11.6* 12.1*  RBC 4.40 4.48 4.67  HGB 11.8* 11.9* 12.3*  HCT 37.8* 39.0 40.7  MCV 85.9 87.1 87.2  MCH 26.8 26.6 26.3  MCHC 31.2 30.5 30.2  RDW 14.6 14.6 14.5  PLT 211 216 233    Cardiac Enzymes Recent Labs  Lab 11/29/18 1249 11/29/18 1643 11/29/18 2303 11/30/18 1019  TROPONINI 0.25* 0.24* 0.25* 0.28*   No results for input(s): TROPIPOC in the last 168 hours.   BNP Recent Labs  Lab 11/29/18 0808  BNP 1,232.9*     DDimer No results for  input(s): DDIMER in the last 168 hours.   Radiology    Dg Abd 1 View  Result Date: 12/03/2018 CLINICAL DATA:  OG tube placement. EXAM: ABDOMEN - 1 VIEW COMPARISON:  Abdominal x-ray dated November 29, 2018. FINDINGS: Enteric tube terminating in the stomach near the gastric antrum. Endotracheal tube at the thoracic inlet. Right internal jugular central venous catheter with tip at the cavoatrial junction. Left basilar atelectasis. IMPRESSION: 1. Enteric tube in the stomach. Electronically Signed   By: Titus Dubin M.D.   On: 12/03/2018 21:24   Dg Chest Port 1 View  Result Date: 12/04/2018 CLINICAL DATA:  Hypoxia EXAM: PORTABLE CHEST 1 VIEW COMPARISON:  12/03/2018 FINDINGS: Cardiomegaly. Prior valve replacement. Support devices are stable. No effusions or edema. No confluent airspace opacities. IMPRESSION: Cardiomegaly.  No acute findings. Electronically Signed   By: Rolm Baptise M.D.   On: 12/04/2018 08:01    Cardiac Studies   Echo 11/29/18:  Study Conclusions  - Left ventricle: The cavity size was normal. Wall thickness was   increased in a pattern of severe LVH. Systolic function was   normal. The estimated ejection fraction was in the range of 50%   to 55%. Mild hypokinesis of the anteroseptal myocardium. Doppler   parameters are consistent with abnormal left ventricular   relaxation (grade 1 diastolic dysfunction). - Aortic valve: A prosthesis was present and functioning normally.   The prosthesis had a normal range of motion. The sewing ring   appeared normal, had no rocking motion, and showed no evidence of   dehiscence. There was mild regurgitation. Mean gradient (S): 10   mm Hg. Valve area (VTI): 1.69 cm^2. Valve area (Vmax): 1.63 cm^2.   Valve area (Vmean): 1.61 cm^2. - Left atrium: The atrium was severely dilated. - Right ventricle: The cavity size was mildly dilated. Wall   thickness was normal. Systolic function was moderately reduced. - Right atrium: The atrium was  moderately to severely dilated. - Atrial  septum: The septum bowed from right to left, consistent   with increased right atrial pressure. - Pulmonary arteries: Systolic pressure was moderately to severely   increased. PA peak pressure: 56 mm Hg (S).  Impressions:  - Severe LVH. Moderate to severe pulmonary hypertension. Aortic   valve stable with mild regurgitation.  Patient Profile     57 y.o. male with bioprosthetic aortic valve, morbid obesity, here with acute on chronic diastolic heart failure, hypoxic respiratory failure requiring intubation and possible pneumonia.    Assessment & Plan    # Acute on chronic diastolic heart failure: Echo this admission revealed LVEF 50-55%.  BNP was 1200.  He appears to be euvolemic.  Renal function slightly worse after giving oral lasix yesterday.  Will hold today.  # Hypoxic/hypercarbic respiratory failure: # CAP: Currently on Ceftriaxone and azithromycin for likely MSSA pneumonia.  Extubated 12/18.  # AKI: Baseline creatinine is around 1.3.  Creatinine today is 1.5.  Hold lasix today.  # Cardiogenic/septic shock: Resolved.    # Hypertension: BP persistently elevated after starting amlodipine.  Home lisinopril on hold 2/2 AKI.  Agree with increasing amlodipine.   # Abnormal EKG:  # Elevated troponin: Initially there was concern for STEMI.  However, relatively unchanged from prior. Troponin elevated to 0.28 and flat.  Consistent with demand ischemia.  He denies chest pain.   # s/p AVR: Valve functioning properly on echo this admission.  Mean gradient 10 mmHg.  Mild AR.  # Pulmonary Hypertension: Mod-severe.  PASP 56 mmHg.  Diuresis as above. 3   For questions or updates, please contact [place] Please consult [email or web address] for contact info under        Signed, Skeet Latch, MD  12/05/2018, 8:55 AM

## 2018-12-05 NOTE — Evaluation (Signed)
Occupational Therapy Evaluation Patient Details Name: [name] MRN: [id number] DOB: Dec 15, 1961 Today's Date: 12/05/2018    History of Present Illness Pt adm with acute hypoxic respiratory failure due to PNA vs CHF. Pt intubated 12/13-12/18. PMH - AVR, CHF, HTN,   Clinical Impression   Pt reports typically living independently. He does not work. Pt presents with impaired cognition, B foot pain and impaired standing balance with heavy reliance on UEs. Pt requires 2 person assist to stand and ambulate and set up to max assist for ADL. Recommending continued rehab in SNF upon discharge. Will follow acutely.    Follow Up Recommendations  SNF;Supervision/Assistance - 24 hour    Equipment Recommendations  3 in 1 bedside commode    Recommendations for Other Services       Precautions / Restrictions Precautions Precautions: Fall Restrictions Weight Bearing Restrictions: No      Mobility Bed Mobility               General bed mobility comments: Pt up in chair.  Transfers Overall transfer level: Needs assistance Equipment used: Rolling walker (2 wheeled) Transfers: Sit to/from Stand Sit to Stand: +2 physical assistance;Mod assist         General transfer comment: Assist to bring hips up. Repeated verbal cues to bring feet back underneath knees prior to attempting to stand. Verbal cues for hand placement.     Balance Overall balance assessment: Needs assistance Sitting-balance support: No upper extremity supported;Feet supported Sitting balance-Leahy Scale: Good     Standing balance support: Bilateral upper extremity supported Standing balance-Leahy Scale: Poor Standing balance comment: walker and min assist for static standing, heavy reliance of UEs due to foot pain                           ADL either performed or assessed with clinical judgement   ADL Overall ADL's : Needs assistance/impaired Eating/Feeding: Set up;Sitting   Grooming: Set  up;Sitting   Upper Body Bathing: Minimal assistance;Sitting   Lower Body Bathing: Maximal assistance;Sit to/from stand;+2 for physical assistance   Upper Body Dressing : Minimal assistance;Sitting   Lower Body Dressing: Maximal assistance;+2 for physical assistance;Sit to/from stand   Toilet Transfer: +2 for physical assistance;Minimal assistance;Ambulation;RW;BSC   Toileting- Clothing Manipulation and Hygiene: Maximal assistance;+2 for physical assistance;Sit to/from stand       Functional mobility during ADLs: Minimal assistance;+2 for physical assistance;Rolling walker       Vision Baseline Vision/History: No visual deficits Patient Visual Report: No change from baseline       Perception     Praxis      Pertinent Vitals/Pain Pain Assessment: 0-10 Pain Score: 7  Pain Location: bilateral feet Pain Descriptors / Indicators: Throbbing Pain Intervention(s): Monitored during session;Repositioned;Limited activity within patient's tolerance     Hand Dominance Right   Extremity/Trunk Assessment Upper Extremity Assessment Upper Extremity Assessment: Overall WFL for tasks assessed   Lower Extremity Assessment Lower Extremity Assessment: Defer to PT evaluation       Communication Communication Communication: Other (comment)(hoarse, low volume)   Cognition Arousal/Alertness: Awake/alert Behavior During Therapy: WFL for tasks assessed/performed Overall Cognitive Status: No family/caregiver present to determine baseline cognitive functioning Area of Impairment: Safety/judgement;Memory                     Memory: Decreased short-term memory   Safety/Judgement: Decreased awareness of safety;Decreased awareness of deficits     General Comments: Pt with flucuating  levels of awareness of safety and deficits. Pt accurately reported he had difficulty getting into bathroom with nursing earlier but didn't seem to grasp that we were present to help him work on this to  improve his level of function. He frequently told us her was a Physiological scientist and that he knew what to do to get better. Per old notes from pt's stay in 2016 it appears at that time there were also concerns about his cognition so this doesn't appear new. After that hospitalization pt went to SNF although at this time he doesn't seem to remember that.    General Comments       Exercises     Shoulder Instructions      Home Living Family/patient expects to be discharged to:: Private residence Living Arrangements: Alone Available Help at Discharge: Friend(s) Type of Home: Apartment Home Access: Level entry     Home Layout: Two level;Full bath on main level Alternate Level Stairs-Number of Steps: flight   Bathroom Shower/Tub: Teacher, early years/pre: Standard     Home Equipment: None   Additional Comments: home information based on pt going to friend's home      Prior Functioning/Environment Level of Independence: Independent                 OT Problem List: Decreased strength;Decreased activity tolerance;Impaired balance (sitting and/or standing);Decreased cognition;Decreased safety awareness;Decreased knowledge of use of DME or AE;Obesity;Pain      OT Treatment/Interventions: Self-care/ADL training;DME and/or AE instruction;Therapeutic activities;Cognitive remediation/compensation;Patient/family education;Balance training    OT Goals(Current goals can be found in the care plan section) Acute Rehab OT Goals Patient Stated Goal: go to friend's apartment OT Goal Formulation: With patient Time For Goal Achievement: 12/19/18 Potential to Achieve Goals: Good ADL Goals Pt Will Perform Grooming: standing;with min assist Pt Will Perform Lower Body Bathing: with min assist;sit to/from stand Pt Will Perform Lower Body Dressing: with min assist;sit to/from stand Pt Will Transfer to Toilet: with min assist;ambulating Pt Will Perform Toileting - Clothing Manipulation  and hygiene: with min assist;sit to/from stand  OT Frequency: Min 2X/week   Barriers to D/C: Decreased caregiver support          Co-evaluation PT/OT/SLP Co-Evaluation/Treatment: Yes Reason for Co-Treatment: For patient/therapist safety PT goals addressed during session: Mobility/safety with mobility;Proper use of DME OT goals addressed during session: ADL's and self-care      AM-PAC OT "6 Clicks" Daily Activity     Outcome Measure Help from another person eating meals?: None Help from another person taking care of personal grooming?: A Little Help from another person toileting, which includes using toliet, bedpan, or urinal?: A Lot Help from another person bathing (including washing, rinsing, drying)?: A Lot Help from another person to put on and taking off regular upper body clothing?: A Little Help from another person to put on and taking off regular lower body clothing?: A Lot 6 Click Score: 16   End of Session Equipment Utilized During Treatment: Gait belt;Rolling walker Nurse Communication: Mobility status  Activity Tolerance: Patient limited by pain Patient left: in chair;with call bell/phone within reach  OT Visit Diagnosis: Unsteadiness on feet (R26.81);Other abnormalities of gait and mobility (R26.89);Pain;Muscle weakness (generalized) (M62.81);Other symptoms and signs involving cognitive function                Time: 9030-0923 OT Time Calculation (min): 20 min Charges:  OT General Charges $OT Visit: 1 Visit OT Evaluation $OT Eval Moderate Complexity: 1 Mod  Nestor Lewandowsky, OTR/L Acute Rehabilitation Services Pager: [phone] Office: [phone]  Malka So 12/05/2018, 3:12 PM

## 2018-12-05 NOTE — Evaluation (Signed)
Physical Therapy Evaluation Patient Details Name: [name] MRN: [id number] DOB: 1961-11-21 Today's Date: 12/05/2018   History of Present Illness  Pt adm with acute hypoxic respiratory failure due to PNA vs CHF. Pt intubated 12/13-12/18. PMH - AVR, CHF, HTN,  Clinical Impression  Pt presents to PT with poor mobility due to decr strength, balance and pain as well as cognitive deficits (appears to be long term). Feel pt will need ST-SNF prior to return home. Pt reports he can stay with a friend at [place] but I am not sure how accurate this information is.     Follow Up Recommendations SNF    Equipment Recommendations  Rolling walker with 5" wheels    Recommendations for Other Services       Precautions / Restrictions Precautions Precautions: Fall Restrictions Weight Bearing Restrictions: No      Mobility  Bed Mobility               General bed mobility comments: Pt up in chair.  Transfers Overall transfer level: Needs assistance Equipment used: Rolling walker (2 wheeled) Transfers: Sit to/from Stand Sit to Stand: +2 physical assistance;Mod assist         General transfer comment: Assist to bring hips up. Repeated verbal cues to bring feet back underneath knees prior to attempting to stand. Verbal cues for hand placement.   Ambulation/Gait Ambulation/Gait assistance: Min assist;+2 safety/equipment Gait Distance (Feet): 8 Feet Assistive device: Rolling walker (2 wheeled) Gait Pattern/deviations: Step-through pattern;Decreased step length - right;Decreased step length - left;Trunk flexed Gait velocity: decr Gait velocity interpretation: <1.31 ft/sec, indicative of household ambulator General Gait Details: Assist for balance and support. Verbal cues not to step past front of walker and to stand more erect. Pt with heavy reliance on UE's  Stairs            Wheelchair Mobility    Modified Rankin (Stroke Patients Only)       Balance Overall balance  assessment: Needs assistance Sitting-balance support: No upper extremity supported;Feet supported Sitting balance-Leahy Scale: Good     Standing balance support: Bilateral upper extremity supported Standing balance-Leahy Scale: Poor Standing balance comment: walker and min assist for static standing                             Pertinent Vitals/Pain Pain Assessment: 0-10 Pain Score: 7  Pain Location: bilateral feet Pain Descriptors / Indicators: Throbbing Pain Intervention(s): Limited activity within patient's tolerance;Repositioned    Home Living Family/patient expects to be discharged to:: Private residence Living Arrangements: Alone Available Help at Discharge: Friend(s) Type of Home: Apartment Home Access: Level entry     Home Layout: Two level;Full bath on main level Home Equipment: None      Prior Function Level of Independence: Independent               Hand Dominance        Extremity/Trunk Assessment   Upper Extremity Assessment Upper Extremity Assessment: Defer to OT evaluation    Lower Extremity Assessment Lower Extremity Assessment: Generalized weakness       Communication   Communication: Other (comment)(hoarse)  Cognition Arousal/Alertness: Awake/alert Behavior During Therapy: WFL for tasks assessed/performed Overall Cognitive Status: No family/caregiver present to determine baseline cognitive functioning Area of Impairment: Safety/judgement;Memory                     Memory: Decreased short-term memory   Safety/Judgement: Decreased awareness  of safety;Decreased awareness of deficits     General Comments: Pt with flucuating levels of awareness of safety and deficits. Pt accurately reported he had difficulty getting into bathroom with nursing earlier but didn't seem to grasp that we were present to help him work on this to improve his level of function. He frequently told us her was a Physiological scientist and that he knew  what to do to get better. Per old notes from pt's stay in 2016 it appears at that time there were also concerns about his cognition so this doesn't appear new. After that hospitalization pt went to SNF although at this time he doesn't seem to remember that.       General Comments      Exercises     Assessment/Plan    PT Assessment Patient needs continued PT services  PT Problem List Decreased strength;Decreased activity tolerance;Decreased balance;Decreased mobility;Decreased cognition;Decreased knowledge of use of DME;Decreased safety awareness;Obesity;Pain       PT Treatment Interventions DME instruction;Gait training;Stair training;Functional mobility training;Therapeutic activities;Therapeutic exercise;Balance training;Patient/family education    PT Goals (Current goals can be found in the Care Plan section)  Acute Rehab PT Goals Patient Stated Goal: go home PT Goal Formulation: With patient Time For Goal Achievement: 12/19/18 Potential to Achieve Goals: Good    Frequency Min 3X/week   Barriers to discharge Decreased caregiver support Unsure of availability of a caregiver    Co-evaluation PT/OT/SLP Co-Evaluation/Treatment: Yes Reason for Co-Treatment: For patient/therapist safety PT goals addressed during session: Mobility/safety with mobility;Proper use of DME         AM-PAC PT "6 Clicks" Mobility  Outcome Measure Help needed turning from your back to your side while in a flat bed without using bedrails?: A Little Help needed moving from lying on your back to sitting on the side of a flat bed without using bedrails?: A Little Help needed moving to and from a bed to a chair (including a wheelchair)?: A Lot Help needed standing up from a chair using your arms (e.g., wheelchair or bedside chair)?: A Lot Help needed to walk in hospital room?: A Lot Help needed climbing 3-5 steps with a railing? : Total 6 Click Score: 13    End of Session Equipment Utilized During  Treatment: Gait belt Activity Tolerance: Patient limited by pain Patient left: in chair;with call bell/phone within reach Nurse Communication: Mobility status PT Visit Diagnosis: Unsteadiness on feet (R26.81);Other abnormalities of gait and mobility (R26.89);Pain Pain - Right/Left: (bilateral) Pain - part of body: Ankle and joints of foot    Time: 0850-0911 PT Time Calculation (min) (ACUTE ONLY): 21 min   Charges:   PT Evaluation $PT Eval Moderate Complexity: [place] Pager [phone] Office [place] 12/05/2018, 2:48 PM

## 2018-12-06 LAB — MAGNESIUM: Magnesium: 2.2 mg/dL (ref 1.7–2.4)

## 2018-12-06 LAB — BASIC METABOLIC PANEL
Anion gap: 7 (ref 5–15)
BUN: 14 mg/dL (ref 6–20)
CALCIUM: 9 mg/dL (ref 8.9–10.3)
CO2: 28 mmol/L (ref 22–32)
CREATININE: 1.26 mg/dL — AB (ref 0.61–1.24)
Chloride: 104 mmol/L (ref 98–111)
GFR calc Af Amer: >60 mL/min (ref >60)
GFR calc non Af Amer: >60 mL/min (ref >60)
Glucose, Bld: 133 mg/dL — ABNORMAL HIGH (ref 70–99)
Potassium: 4.3 mmol/L (ref 3.5–5.1)
Sodium: 139 mmol/L (ref 135–145)

## 2018-12-06 LAB — CBC WITH DIFFERENTIAL/PLATELET
Abs Immature Granulocytes: 0.06 10*3/uL (ref 0.00–0.07)
Basophils Absolute: 0 10*3/uL (ref 0.0–0.1)
Basophils Relative: 0 %
EOS PCT: 2 %
Eosinophils Absolute: 0.2 10*3/uL (ref 0.0–0.5)
HCT: 39.3 % (ref 39.0–52.0)
HEMOGLOBIN: 11.9 g/dL — AB (ref 13.0–17.0)
Immature Granulocytes: 1 %
Lymphocytes Relative: 14 %
Lymphs Abs: 1.6 10*3/uL (ref 0.7–4.0)
MCH: 26.5 pg (ref 26.0–34.0)
MCHC: 30.3 g/dL (ref 30.0–36.0)
MCV: 87.5 fL (ref 80.0–100.0)
MONO ABS: 0.9 10*3/uL (ref 0.1–1.0)
Monocytes Relative: 8 %
Neutro Abs: 8.6 10*3/uL — ABNORMAL HIGH (ref 1.7–7.7)
Neutrophils Relative %: 75 %
Platelets: 244 10*3/uL (ref 150–400)
RBC: 4.49 MIL/uL (ref 4.22–5.81)
RDW: 14.2 % (ref 11.5–15.5)
WBC: 11.4 10*3/uL — ABNORMAL HIGH (ref 4.0–10.5)
nRBC: 0 % (ref 0.0–0.2)

## 2018-12-06 LAB — GLUCOSE, CAPILLARY
Glucose-Capillary: 103 mg/dL — ABNORMAL HIGH (ref 70–99)
Glucose-Capillary: 110 mg/dL — ABNORMAL HIGH (ref 70–99)
Glucose-Capillary: 113 mg/dL — ABNORMAL HIGH (ref 70–99)
Glucose-Capillary: 122 mg/dL — ABNORMAL HIGH (ref 70–99)
Glucose-Capillary: 123 mg/dL — ABNORMAL HIGH (ref 70–99)
Glucose-Capillary: 78 mg/dL (ref 70–99)

## 2018-12-06 LAB — HEMOGLOBIN A1C
Hgb A1c MFr Bld: 6.6 % — ABNORMAL HIGH (ref 4.8–5.6)
MEAN PLASMA GLUCOSE: 142.72 mg/dL

## 2018-12-06 LAB — PHOSPHORUS: Phosphorus: 3.7 mg/dL (ref 2.5–4.6)

## 2018-12-06 NOTE — Progress Notes (Signed)
Progress Note  Patient Name: [name] Date of Encounter: 12/06/2018  Primary Cardiologist: [name], MD   Subjective   Still feeling weak.  Denies chest pain or shortness of breath.   Inpatient Medications    Scheduled Meds: . amLODipine  10 mg Oral Daily  . aspirin EC  325 mg Oral Daily  . heparin injection (subcutaneous)  5,000 Units Subcutaneous Q8H  . insulin aspart  0-15 Units Subcutaneous Q4H  . pantoprazole  40 mg Oral Daily  . polyethylene glycol  17 g Oral Daily  . sodium chloride flush  10-40 mL Intracatheter Q12H   Continuous Infusions: . cefTRIAXone (ROCEPHIN)  IV Stopped (12/05/18 1038)   PRN Meds: benzonatate, bisacodyl, fentaNYL, hydrALAZINE, sodium chloride flush   Vital Signs    Vitals:   12/06/18 0603 12/06/18 0700 12/06/18 0730 12/06/18 0800  BP: 137/73 114/77  137/75  Pulse: 63 (!) 58  63  Resp: (!) 24 18  18   Temp:   99.1 F (37.3 C)   TempSrc:   Oral   SpO2: 97% 95%  99%  Weight:      Height:        Intake/Output Summary (Last 24 hours) at 12/06/2018 0905 Last data filed at 12/06/2018 0800 Gross per 24 hour  Intake 1400 ml  Output 1275 ml  Net 125 ml   Filed Weights   12/02/18 0500 12/03/18 0500 12/04/18 0500  Weight: 117.3 kg 113.8 kg 118.2 kg    Telemetry    Sinus bradycardia.  [place] I. PVCs.   - Personally Reviewed  ECG    N/a  - Personally Reviewed  Physical Exam   VS:  BP 137/75 (BP Location: Right Arm)   Pulse 63   Temp 99.1 F (37.3 C) (Oral)   Resp 18   Ht 5\' 11"  (1.803 m)   Wt 118.2 kg   SpO2 99%   BMI 36.34 kg/m  , BMI Body mass index is 36.34 kg/m. GENERAL:  Well appearing HEENT: Pupils equal round and reactive, fundi not visualized, oral mucosa unremarkable NECK:  No jugular venous distention, waveform within normal limits, carotid upstroke brisk and symmetric, no bruits LUNGS:  Clear to auscultation bilaterally HEART:  RRR.  PMI not displaced or sustained,S1 and S2 within normal  limits, no S3, no S4, no clicks, no rubs, III/VI systolic murmur at the LUSB ABD:  Flat, positive bowel sounds normal in frequency in pitch, no bruits, no rebound, no guarding, no midline pulsatile mass, no hepatomegaly, no splenomegaly EXT:  2 plus pulses throughout, no edema, no cyanosis no clubbing SKIN:  No rashes no nodules NEURO:  Cranial nerves II through XII grossly intact, motor grossly intact throughout [facility]:  Cognitively intact, oriented to person place and time   Labs    Chemistry Recent Labs  Lab 11/30/18 0435  12/03/18 0325 12/04/18 0455 12/05/18 0010  NA 142   < > 146* 149* 142  K 4.0   < > 3.5 3.6 3.7  CL 106   < > 106 112* 105  CO2 24   < > 26 25 25   GLUCOSE 121*   < > 118* 132* 162*  BUN 21*   < > 40* 37* 28*  CREATININE 1.87*   < > 1.71* 1.41* 1.51*  CALCIUM 8.4*   < > 9.0 9.4 9.3  PROT 6.0*  --   --   --   --   ALBUMIN 2.9*  --  3.0* 2.9*  --  AST 22  --   --   --   --   ALT 21  --   --   --   --   ALKPHOS 71  --   --   --   --   BILITOT 1.6*  --   --   --   --   GFRNONAA 39*   < > 43* 55* 51*  GFRAA 45*   < > 50* >60 59*  ANIONGAP 12   < > 14 12 12    < > = values in this interval not displayed.     Hematology Recent Labs  Lab 12/04/18 0455 12/05/18 0422 12/06/18 0351  WBC 11.6* 12.1* 11.4*  RBC 4.48 4.67 4.49  HGB 11.9* 12.3* 11.9*  HCT 39.0 40.7 39.3  MCV 87.1 87.2 87.5  MCH 26.6 26.3 26.5  MCHC 30.5 30.2 30.3  RDW 14.6 14.5 14.2  PLT 216 233 244    Cardiac Enzymes Recent Labs  Lab 11/29/18 1249 11/29/18 1643 11/29/18 2303 11/30/18 1019  TROPONINI 0.25* 0.24* 0.25* 0.28*   No results for input(s): TROPIPOC in the last 168 hours.   BNP No results for input(s): BNP, PROBNP in the last 168 hours.   DDimer No results for input(s): DDIMER in the last 168 hours.   Radiology    No results found.  Cardiac Studies   Echo 11/29/18:  Study Conclusions  - Left ventricle: The cavity size was normal. Wall thickness was    increased in a pattern of severe LVH. Systolic function was   normal. The estimated ejection fraction was in the range of 50%   to 55%. Mild hypokinesis of the anteroseptal myocardium. Doppler   parameters are consistent with abnormal left ventricular   relaxation (grade 1 diastolic dysfunction). - Aortic valve: A prosthesis was present and functioning normally.   The prosthesis had a normal range of motion. The sewing ring   appeared normal, had no rocking motion, and showed no evidence of   dehiscence. There was mild regurgitation. Mean gradient (S): 10   mm Hg. Valve area (VTI): 1.69 cm^2. Valve area (Vmax): 1.63 cm^2.   Valve area (Vmean): 1.61 cm^2. - Left atrium: The atrium was severely dilated. - Right ventricle: The cavity size was mildly dilated. Wall   thickness was normal. Systolic function was moderately reduced. - Right atrium: The atrium was moderately to severely dilated. - Atrial septum: The septum bowed from right to left, consistent   with increased right atrial pressure. - Pulmonary arteries: Systolic pressure was moderately to severely   increased. PA peak pressure: 56 mm Hg (S).  Impressions:  - Severe LVH. Moderate to severe pulmonary hypertension. Aortic   valve stable with mild regurgitation.  Patient Profile     58 y.o. male with bioprosthetic aortic valve, morbid obesity, here with acute on chronic diastolic heart failure, hypoxic respiratory failure requiring intubation and possible pneumonia.    Assessment & Plan    # Acute on chronic diastolic heart failure: Echo this admission revealed LVEF 50-55%.  BNP was 1200.  He appears to be euvolemic.  Renal function slightly worse after giving oral lasix.  Lasix was held 12/19.  Repeat BMP pending.    # Hypoxic/hypercarbic respiratory failure: # CAP: Currently on Ceftriaxone and azithromycin for likely MSSA pneumonia.  Extubated 12/18.  # AKI: Baseline creatinine is around 1.3.  Creatinine yesterday was  1.5.  Awaiting BMP to determine need for diuretic dosing.  # Cardiogenic/septic shock: Resolved.    #  Hypertension: BP persistently elevated after starting amlodipine.  Home lisinopril on hold 2/2 AKI.  Agree with increasing amlodipine.   # Abnormal EKG:  # Elevated troponin: Initially there was concern for STEMI.  However, relatively unchanged from prior. Troponin elevated to 0.28 and flat.  Consistent with demand ischemia.  He denies chest pain.   # s/p AVR: Valve functioning properly on echo this admission.  Mean gradient 10 mmHg.  Mild AR.  # Pulmonary Hypertension: Mod-severe.  PASP 56 mmHg.  Diuresis as above. 3  Stable for transfer to telemetry from cardiology standpoint.  For questions or updates, please contact [place] Please consult [email or web address] for contact info under        Signed, Skeet Latch, MD  12/06/2018, 9:05 AM

## 2018-12-06 NOTE — Plan of Care (Signed)

## 2018-12-06 NOTE — Progress Notes (Signed)
[name] is a 57 y.o. male patient admitted from ED awake, alert - oriented  X 4 - no acute distress noted.  VSS - Blood pressure 123/72, pulse 67, temperature 98.9 F (37.2 C), resp. rate (!) 24, height 5\' 11"  (1.803 m), weight 118.2 kg, SpO2 99 %.    IV in place, occlusive dsg intact without redness.  Orientation to room, and floor completed with information packet given to patient/family.  Patient declined safety video at this time.  Admission INP armband ID verified with patient/family, and in place.   SR up x 2, fall assessment complete, with patient and family able to verbalize understanding of risk associated with falls, and verbalized understanding to call nsg before up out of bed.  Call light within reach, patient able to voice, and demonstrate understanding.  Skin, clean-dry- intact without evidence of bruising, or skin tears.   No evidence of skin break down noted on exam.     Will cont to eval and treat per MD orders.  Luci Bank, RN 12/06/2018 3:34 PM

## 2018-12-06 NOTE — Progress Notes (Signed)
PROGRESS NOTE  [name] HDQ:[id number] DOB: Oct 12, 1961 DOA: 11/29/2018 PCP: No primary care provider on file.  HPI/Recap of past 24 hours: 57yo male with PMH of atrial flutter, bicuspid aortic valve with stenosis s/p AVR, HTN, sCHF, CKD presenting to [facility] via EMS as code STEMI. Developed acute shortness of breath while driving his car.  Rapidly progressed to intubation on arrival.  Initially thought to have had STEMI but comparison of previous ECGs did not support this.    Antibiotics started for presumed community-acquired pneumonia.  Extubated on 12/04/2018.  Transferred to [facility] services on 12/05/2018.  12/05/2018: Patient seen and examined at his bedside.  No acute events overnight.  Denies any chest pain, dyspnea, or palpitations.  Constipation is resolving.  Blood pressure remains elevated, medications being adjusted.  12/06/18: Seen and examined at bedside. Reports pins and needles in his feet. Will work with PT.  Assessment/Plan: Active Problems:   S/P AVR   Respiratory failure (HCC)   Shock circulatory (HCC)   Hypoxia  Acute hypoxic hypercarbic respiratory failure requiring invasive mechanical ventilation secondary to MSSA community-acquired pneumonia versus acute on chronic diastolic CHF Extubated on 12/04/2018 On Rocephin day #4 On cardiac medications and strict I's and O's Afebrile Continue nebs Home O2 evaluation prior to discharge Independent reviewed chest x-ray done during this admission which revealed bilateral lower lobe infiltrates versus atelectasis.  MSSA community-acquired pneumonia Management as stated above Procalcitonin 1.93 on 12/03/2018  Acute on chronic diastolic CHF Last 2D echo done on 12/16/2018 revealed LVEF 50 to 55% Continue current medications Continue strict I's and O's  AKI on CKD 2 Baseline creatinine appears to be 1.41 with GFR greater than 60 Presented with creatinine of 1.65 and GFR 53 Creatinine 1.26, on 12/05/2018 1.51 with  GFR 59 Avoid nephrotoxic agents/dehydration/hypotension Replete BMP in the morning  Uncontrolled hypertension Amlodipine restarted and increased dose to 10 mg daily Continue to monitor vital signs  Hypophosphatemia Phosphorus 3.7 from 5.0 Repeat level in the morning   Code Status: Full code  Family Communication: None at bedside  Disposition Plan: Home possibly in 1-2 days when cardiology signs off   Consultants:  PCCM  Cardiology  Procedures:  Extubation 12/04/2018  Antimicrobials:  Rocephin  DVT prophylaxis: Subcu heparin 3 times daily   Objective: Vitals:   12/06/18 0730 12/06/18 0800 12/06/18 0930 12/06/18 1000  BP:  137/75 (!) 93/59 116/70  Pulse:  63 74 (!) 59  Resp:  18 20 (!) 24  Temp: 99.1 F (37.3 C)     TempSrc: Oral     SpO2:  99% 93% 97%  Weight:      Height:        Intake/Output Summary (Last 24 hours) at 12/06/2018 1129 Last data filed at 12/06/2018 1100 Gross per 24 hour  Intake 1540.07 ml  Output 1425 ml  Net 115.07 ml   Filed Weights   12/02/18 0500 12/03/18 0500 12/04/18 0500  Weight: 117.3 kg 113.8 kg 118.2 kg    Exam:  . General: 57 y.o. year-old male WD WN NAD A&O x3 . Cardiovascular: RRR no jvd no thyromegaly  . Respiratory: CTA no wheezes no rales . Abdomen: Soft nontender nondistended with normal bowel sounds x4 quadrants. . Musculoskeletal: Trace lower extremity edema. 2/4 pulses in all 4 extremities. . Skin: No ulcerative lesions noted or rashes . Psychiatry: Mood is appropriate for condition and setting   Data Reviewed: CBC: Recent Labs  Lab 11/30/18 0435 11/30/18 1019 12/03/18 0325 12/04/18 0455 12/05/18  0422 12/06/18 0351  WBC 15.6*  --  12.4* 11.6* 12.1* 11.4*  NEUTROABS  --   --   --   --  8.9* 8.6*  HGB 12.5* 12.2* 11.8* 11.9* 12.3* 11.9*  HCT 40.5 38.2* 37.8* 39.0 40.7 39.3  MCV 85.6  --  85.9 87.1 87.2 87.5  PLT 195  --  211 216 233 161   Basic Metabolic Panel: Recent Labs  Lab  12/01/18 0525 12/01/18 1658 12/02/18 0334 12/03/18 0325 12/04/18 0455 12/05/18 0010 12/05/18 0422 12/06/18 0351  NA 142 142 146* 146* 149* 142  --  139  K 3.7 3.3* 3.4* 3.5 3.6 3.7  --  4.3  CL 104 106 108 106 112* 105  --  104  CO2 25 25 26 26 25 25   --  28  GLUCOSE 122* 125* 115* 118* 132* 162*  --  133*  BUN 32* 33* 36* 40* 37* 28*  --  14  CREATININE 1.65* 1.54* 1.65* 1.71* 1.41* 1.51*  --  1.26*  CALCIUM 8.7* 8.6* 8.8* 9.0 9.4 9.3  --  9.0  MG 2.4 2.2  --  2.6*  --   --  2.3 2.2  PHOS 2.5 3.5  --  4.4 3.8  --  5.0* 3.7   GFR: Estimated Creatinine Clearance: 84.6 mL/min (A) (by C-G formula based on SCr of 1.26 mg/dL (H)). Liver Function Tests: Recent Labs  Lab 11/30/18 0435 12/03/18 0325 12/04/18 0455  AST 22  --   --   ALT 21  --   --   ALKPHOS 71  --   --   BILITOT 1.6*  --   --   PROT 6.0*  --   --   ALBUMIN 2.9* 3.0* 2.9*   No results for input(s): LIPASE, AMYLASE in the last 168 hours. No results for input(s): AMMONIA in the last 168 hours. Coagulation Profile: No results for input(s): INR, PROTIME in the last 168 hours. Cardiac Enzymes: Recent Labs  Lab 11/29/18 1249 11/29/18 1643 11/29/18 2303 11/30/18 1019  TROPONINI 0.25* 0.24* 0.25* 0.28*   BNP (last 3 results) No results for input(s): PROBNP in the last 8760 hours. HbA1C: No results for input(s): HGBA1C in the last 72 hours. CBG: Recent Labs  Lab 12/05/18 1508 12/05/18 2004 12/06/18 0003 12/06/18 0347 12/06/18 0729  GLUCAP 104* 97 78 113* 123*   Lipid Profile: No results for input(s): CHOL, HDL, LDLCALC, TRIG, CHOLHDL, LDLDIRECT in the last 72 hours. Thyroid Function Tests: No results for input(s): TSH, T4TOTAL, FREET4, T3FREE, THYROIDAB in the last 72 hours. Anemia Panel: No results for input(s): VITAMINB12, FOLATE, FERRITIN, TIBC, IRON, RETICCTPCT in the last 72 hours. Urine analysis:    Component Value Date/Time   COLORURINE YELLOW 12/02/2018 [place]  12/02/2018 1540   LABSPEC 1.012 12/02/2018 1540   PHURINE 7.0 12/02/2018 1540   GLUCOSEU NEGATIVE 12/02/2018 1540   HGBUR NEGATIVE 12/02/2018 1540   BILIRUBINUR NEGATIVE 12/02/2018 1540   KETONESUR NEGATIVE 12/02/2018 1540   PROTEINUR NEGATIVE 12/02/2018 1540   UROBILINOGEN 1.0 04/17/2015 1348   NITRITE NEGATIVE 12/02/2018 1540   LEUKOCYTESUR NEGATIVE 12/02/2018 1540   Sepsis Labs: @LABRCNTIP (procalcitonin:4,lacticidven:4)  ) Recent Results (from the past 240 hour(s))  Surgical pcr screen     Status: Abnormal   Collection Time: 11/29/18 12:45 PM  Result Value Ref Range Status   MRSA, PCR NEGATIVE NEGATIVE Final   Staphylococcus aureus POSITIVE (A) NEGATIVE Final    Comment: (NOTE) The Xpert SA Assay (FDA  approved for NASAL specimens in patients 4 years of age and older), is one component of a comprehensive surveillance program. It is not intended to diagnose infection nor to guide or monitor treatment. Performed at [facility], [place] [address], [place], [place] [address]   Culture, respiratory (non-expectorated)     Status: None   Collection Time: 11/30/18 12:43 PM  Result Value Ref Range Status   Specimen Description TRACHEAL ASPIRATE  Final   Special Requests NONE  Final   Gram Stain   Final    RARE WBC PRESENT, PREDOMINANTLY PMN NO ORGANISMS SEEN Performed at [facility], [place] [address], [place], [place] [address]    Culture RARE STAPHYLOCOCCUS AUREUS  Final   Report Status 12/03/2018 FINAL  Final   Organism ID, Bacteria STAPHYLOCOCCUS AUREUS  Final      Susceptibility   Staphylococcus aureus - MIC*    CIPROFLOXACIN <=0.5 SENSITIVE Sensitive     ERYTHROMYCIN <=0.25 SENSITIVE Sensitive     GENTAMICIN <=0.5 SENSITIVE Sensitive     OXACILLIN 0.5 SENSITIVE Sensitive     TETRACYCLINE <=1 SENSITIVE Sensitive     VANCOMYCIN <=0.5 SENSITIVE Sensitive     TRIMETH/SULFA <=10 SENSITIVE Sensitive     CLINDAMYCIN <=0.25 SENSITIVE Sensitive     RIFAMPIN <=0.5  SENSITIVE Sensitive     Inducible Clindamycin NEGATIVE Sensitive     * RARE STAPHYLOCOCCUS AUREUS  Culture, blood (routine x 2)     Status: None (Preliminary result)   Collection Time: 12/02/18  5:53 AM  Result Value Ref Range Status   Specimen Description BLOOD LEFT HAND  Final   Special Requests   Final    BOTTLES DRAWN AEROBIC ONLY Blood Culture adequate volume   Culture   Final    NO GROWTH 4 DAYS Performed at [facility], [place] [address], [place], [place] [address]    Report Status PENDING  Incomplete  Culture, blood (routine x 2)     Status: None (Preliminary result)   Collection Time: 12/02/18  5:53 AM  Result Value Ref Range Status   Specimen Description BLOOD LEFT HAND  Final   Special Requests   Final    BOTTLES DRAWN AEROBIC ONLY Blood Culture adequate volume   Culture   Final    NO GROWTH 4 DAYS Performed at [facility], [place] [address] [place], [place] [address]    Report Status PENDING  Incomplete      Studies: No results found.  Scheduled Meds: . amLODipine  10 mg Oral Daily  . aspirin EC  325 mg Oral Daily  . heparin injection (subcutaneous)  5,000 Units Subcutaneous Q8H  . insulin aspart  0-15 Units Subcutaneous Q4H  . pantoprazole  40 mg Oral Daily  . polyethylene glycol  17 g Oral Daily  . sodium chloride flush  10-40 mL Intracatheter Q12H    Continuous Infusions: . cefTRIAXone (ROCEPHIN)  IV Stopped (12/06/18 1008)     LOS: 7 days     Kayleen Memos, MD [facility] Pager [phone]  If 7PM-7AM, please contact night-coverage [email or web address] Password [facility] 12/06/2018, 11:29 AM

## 2018-12-07 ENCOUNTER — Inpatient Hospital Stay (HOSPITAL_COMMUNITY): Payer: Medicare Other

## 2018-12-07 DIAGNOSIS — M79609 Pain in unspecified limb: Secondary | ICD-10-CM

## 2018-12-07 DIAGNOSIS — M7989 Other specified soft tissue disorders: Secondary | ICD-10-CM

## 2018-12-07 LAB — CBC WITH DIFFERENTIAL/PLATELET
Abs Immature Granulocytes: 0.06 10*3/uL (ref 0.00–0.07)
Basophils Absolute: 0.1 10*3/uL (ref 0.0–0.1)
Basophils Relative: 0 %
Eosinophils Absolute: 0.2 10*3/uL (ref 0.0–0.5)
Eosinophils Relative: 2 %
HCT: 38.8 % — ABNORMAL LOW (ref 39.0–52.0)
Hemoglobin: 12.4 g/dL — ABNORMAL LOW (ref 13.0–17.0)
Immature Granulocytes: 1 %
Lymphocytes Relative: 16 %
Lymphs Abs: 1.9 10*3/uL (ref 0.7–4.0)
MCH: 27.7 pg (ref 26.0–34.0)
MCHC: 32 g/dL (ref 30.0–36.0)
MCV: 86.8 fL (ref 80.0–100.0)
Monocytes Absolute: 0.7 10*3/uL (ref 0.1–1.0)
Monocytes Relative: 6 %
Neutro Abs: 9 10*3/uL — ABNORMAL HIGH (ref 1.7–7.7)
Neutrophils Relative %: 75 %
Platelets: 290 10*3/uL (ref 150–400)
RBC: 4.47 MIL/uL (ref 4.22–5.81)
RDW: 14 % (ref 11.5–15.5)
WBC: 12 10*3/uL — ABNORMAL HIGH (ref 4.0–10.5)
nRBC: 0 % (ref 0.0–0.2)

## 2018-12-07 LAB — GLUCOSE, CAPILLARY
Glucose-Capillary: 110 mg/dL — ABNORMAL HIGH (ref 70–99)
Glucose-Capillary: 116 mg/dL — ABNORMAL HIGH (ref 70–99)
Glucose-Capillary: 118 mg/dL — ABNORMAL HIGH (ref 70–99)
Glucose-Capillary: 120 mg/dL — ABNORMAL HIGH (ref 70–99)
Glucose-Capillary: 132 mg/dL — ABNORMAL HIGH (ref 70–99)
Glucose-Capillary: 138 mg/dL — ABNORMAL HIGH (ref 70–99)

## 2018-12-07 LAB — CULTURE, BLOOD (ROUTINE X 2)
Culture: NO GROWTH
Culture: NO GROWTH
Special Requests: ADEQUATE
Special Requests: ADEQUATE

## 2018-12-07 LAB — PHOSPHORUS: Phosphorus: 2.9 mg/dL (ref 2.5–4.6)

## 2018-12-07 LAB — MAGNESIUM: Magnesium: 2.2 mg/dL (ref 1.7–2.4)

## 2018-12-07 MED ORDER — FUROSEMIDE 20 MG PO TABS
20.0000 mg | ORAL_TABLET | Freq: Every day | ORAL | Status: DC
  Administered 2018-12-07 – 2018-12-10 (×4): 20 mg via ORAL
  Filled 2018-12-07 (×4): qty 1

## 2018-12-07 MED ORDER — ACETAMINOPHEN 325 MG PO TABS
650.0000 mg | ORAL_TABLET | Freq: Four times a day (QID) | ORAL | Status: DC | PRN
  Administered 2018-12-07: 650 mg via ORAL
  Filled 2018-12-07: qty 2

## 2018-12-07 MED ORDER — OXYCODONE HCL 5 MG PO TABS
5.0000 mg | ORAL_TABLET | ORAL | Status: DC | PRN
  Administered 2018-12-07 – 2018-12-09 (×4): 5 mg via ORAL
  Filled 2018-12-07 (×4): qty 1

## 2018-12-07 MED ORDER — PHENOL 1.4 % MT LIQD
1.0000 | OROMUCOSAL | Status: DC | PRN
  Filled 2018-12-07: qty 177

## 2018-12-07 NOTE — Progress Notes (Signed)
PROGRESS NOTE  [name] KWI:[id number] DOB: [birth year] DOA: 11/29/2018 PCP: No primary care provider on file.  HPI/Recap of past 24 hours: 57yo male with PMH of atrial flutter, bicuspid aortic valve with stenosis s/p AVR, HTN, sCHF, CKD presenting to [facility] via EMS as code STEMI. Developed acute shortness of breath while driving his car.  Rapidly progressed to intubation on arrival.  Initially thought to have had STEMI but comparison of previous ECGs did not support this.    Antibiotics started for presumed community-acquired pneumonia.  Extubated on 12/04/2018.  Transferred to [facility] services on 12/05/2018.  12/05/2018: Constipation is resolving.  Blood pressure remains elevated, medications being adjusted. 12/06/18: Seen and examined at bedside. Reports pins and needles in his feet. Will work with PT.  12/07/2018: Patient seen and examined at his bedside.  He complains of right upper extremity swelling and pain.  Febrile overnight with T-max of 100.7.  Currently on Rocephin for community-acquired pneumonia.  Right upper extremity duplex ultrasound ordered to further assess.  Also reports foot swelling bilaterally.  Lasix resumed today.  Held previously due to AKI.  Assessment/Plan: Active Problems:   S/P AVR   Respiratory failure (HCC)   Shock circulatory (HCC)   Hypoxia  Acute hypoxic hypercarbic respiratory failure requiring invasive mechanical ventilation secondary to MSSA community-acquired pneumonia versus acute on chronic diastolic CHF Extubated on 12/04/2018 On Rocephin day #5 On cardiac medications and strict I's and O's Afebrile Continue nebs Home O2 evaluation prior to discharge Independent reviewed chest x-ray done during this admission which revealed bilateral lower lobe infiltrates versus atelectasis.  Right upper extremity swelling and pain Obtain duplex ultrasound to rule out DVT  Status post aVR/moderate to severe pulmonary hypertension Follows with  cardiology PASP 56 mmhg Continues to diurese  MSSA community-acquired pneumonia Management as stated above Procalcitonin 1.93 on 12/03/2018 Repeat CBC and procalcitonin in the morning If elevated or if fever persists repeat blood cultures in the morning x2  Acute on chronic diastolic CHF Last 2D echo done on 12/16/2018 revealed LVEF 50 to 55% Continue current medications Continue strict I's and O's  AKI on CKD 2 Baseline creatinine appears to be 1.41 with GFR greater than 60 Presented with creatinine of 1.65 and GFR 53 Creatinine 1.26, on 12/05/2018 1.51 with GFR 59 Avoid nephrotoxic agents/dehydration/hypotension Repeat BMP in the morning  Uncontrolled hypertension Amlodipine restarted and increased dose to 10 mg daily Continue to monitor vital signs  Hypophosphatemia Phosphorus 3.7 from 5.0 Repeat level in the morning   Code Status: Full code  Family Communication: None at bedside  Disposition Plan: Home possibly in 1-2 days when cardiology signs off   Consultants:  PCCM  Cardiology  Procedures:  Extubation 12/04/2018  Antimicrobials:  Rocephin  DVT prophylaxis: Subcu heparin 3 times daily   Objective: Vitals:   12/06/18 1954 12/07/18 0428 12/07/18 0643 12/07/18 1323  BP: 131/67 109/71  129/75  Pulse: 67 77  61  Resp: 18 18  18   Temp: 99.8 F (37.7 C) (!) 100.7 F (38.2 C) 98.3 F (36.8 C) 97.6 F (36.4 C)  TempSrc: Oral Oral Oral Oral  SpO2: 100% 94%  100%  Weight:      Height:        Intake/Output Summary (Last 24 hours) at 12/07/2018 1540 Last data filed at 12/07/2018 1200 Gross per 24 hour  Intake 130 ml  Output 1800 ml  Net -1670 ml   Filed Weights   12/02/18 0500 12/03/18 0500 12/04/18 0500  Weight:  117.3 kg 113.8 kg 118.2 kg    Exam:  . General: 57 y.o. year-old male well developed well-nourished in no acute distress.  Alert and oriented x3.  Cardiovascular: Regular rate and rhythm with no rubs or gallops.  No JVD or  thyromegaly noted. [name] Respiratory: Clear to station with no wheezes or rales.  Good respiratory effort. . Abdomen: Soft nontender nondistended with normal bowel sounds x4 quadrants. . Musculoskeletal: Trace lower extremity edema mostly affecting foot bilaterally. 2/4 pulses in all 4 extremities. . Skin: No ulcerative lesions noted or rashes . Psychiatry: Mood is appropriate for condition and setting   Data Reviewed: CBC: Recent Labs  Lab 12/03/18 0325 12/04/18 0455 12/05/18 0422 12/06/18 0351 12/07/18 0431  WBC 12.4* 11.6* 12.1* 11.4* 12.0*  NEUTROABS  --   --  8.9* 8.6* 9.0*  HGB 11.8* 11.9* 12.3* 11.9* 12.4*  HCT 37.8* 39.0 40.7 39.3 38.8*  MCV 85.9 87.1 87.2 87.5 86.8  PLT 211 216 233 244 373   Basic Metabolic Panel: Recent Labs  Lab 12/01/18 1658 12/02/18 0334 12/03/18 0325 12/04/18 0455 12/05/18 0010 12/05/18 0422 12/06/18 0351 12/07/18 0431  NA 142 146* 146* 149* 142  --  139  --   K 3.3* 3.4* 3.5 3.6 3.7  --  4.3  --   CL 106 108 106 112* 105  --  104  --   CO2 25 26 26 25 25   --  28  --   GLUCOSE 125* 115* 118* 132* 162*  --  133*  --   BUN 33* 36* 40* 37* 28*  --  14  --   CREATININE 1.54* 1.65* 1.71* 1.41* 1.51*  --  1.26*  --   CALCIUM 8.6* 8.8* 9.0 9.4 9.3  --  9.0  --   MG 2.2  --  2.6*  --   --  2.3 2.2 2.2  PHOS 3.5  --  4.4 3.8  --  5.0* 3.7 2.9   GFR: Estimated Creatinine Clearance: 84.6 mL/min (A) (by C-G formula based on SCr of 1.26 mg/dL (H)). Liver Function Tests: Recent Labs  Lab 12/03/18 0325 12/04/18 0455  ALBUMIN 3.0* 2.9*   No results for input(s): LIPASE, AMYLASE in the last 168 hours. No results for input(s): AMMONIA in the last 168 hours. Coagulation Profile: No results for input(s): INR, PROTIME in the last 168 hours. Cardiac Enzymes: No results for input(s): CKTOTAL, CKMB, CKMBINDEX, TROPONINI in the last 168 hours. BNP (last 3 results) No results for input(s): PROBNP in the last 8760 hours. HbA1C: Recent Labs     12/06/18 1526  HGBA1C 6.6*   CBG: Recent Labs  Lab 12/06/18 1948 12/07/18 0004 12/07/18 0420 12/07/18 0734 12/07/18 1216  GLUCAP 122* 118* 120* 138* 132*   Lipid Profile: No results for input(s): CHOL, HDL, LDLCALC, TRIG, CHOLHDL, LDLDIRECT in the last 72 hours. Thyroid Function Tests: No results for input(s): TSH, T4TOTAL, FREET4, T3FREE, THYROIDAB in the last 72 hours. Anemia Panel: No results for input(s): VITAMINB12, FOLATE, FERRITIN, TIBC, IRON, RETICCTPCT in the last 72 hours. Urine analysis:    Component Value Date/Time   COLORURINE YELLOW 12/02/2018 [place] 12/02/2018 1540   LABSPEC 1.012 12/02/2018 1540   PHURINE 7.0 12/02/2018 1540   GLUCOSEU NEGATIVE 12/02/2018 1540   HGBUR NEGATIVE 12/02/2018 1540   BILIRUBINUR NEGATIVE 12/02/2018 1540   KETONESUR NEGATIVE 12/02/2018 1540   PROTEINUR NEGATIVE 12/02/2018 1540   UROBILINOGEN 1.0 04/17/2015 1348   NITRITE NEGATIVE 12/02/2018 1540  LEUKOCYTESUR NEGATIVE 12/02/2018 1540   Sepsis Labs: @LABRCNTIP (procalcitonin:4,lacticidven:4)  ) Recent Results (from the past 240 hour(s))  Surgical pcr screen     Status: Abnormal   Collection Time: 11/29/18 12:45 PM  Result Value Ref Range Status   MRSA, PCR NEGATIVE NEGATIVE Final   Staphylococcus aureus POSITIVE (A) NEGATIVE Final    Comment: (NOTE) The Xpert SA Assay (FDA approved for NASAL specimens in patients 85 years of age and older), is one component of a comprehensive surveillance program. It is not intended to diagnose infection nor to guide or monitor treatment. Performed at [facility], [place] [address], [place], [place] [address]   Culture, respiratory (non-expectorated)     Status: None   Collection Time: 11/30/18 12:43 PM  Result Value Ref Range Status   Specimen Description TRACHEAL ASPIRATE  Final   Special Requests NONE  Final   Gram Stain   Final    RARE WBC PRESENT, PREDOMINANTLY PMN NO ORGANISMS SEEN Performed at [facility], [place] [address] [place], [place] [address]    Culture RARE STAPHYLOCOCCUS AUREUS  Final   Report Status 12/03/2018 FINAL  Final   Organism ID, Bacteria STAPHYLOCOCCUS AUREUS  Final      Susceptibility   Staphylococcus aureus - MIC*    CIPROFLOXACIN <=0.5 SENSITIVE Sensitive     ERYTHROMYCIN <=0.25 SENSITIVE Sensitive     GENTAMICIN <=0.5 SENSITIVE Sensitive     OXACILLIN 0.5 SENSITIVE Sensitive     TETRACYCLINE <=1 SENSITIVE Sensitive     VANCOMYCIN <=0.5 SENSITIVE Sensitive     TRIMETH/SULFA <=10 SENSITIVE Sensitive     CLINDAMYCIN <=0.25 SENSITIVE Sensitive     RIFAMPIN <=0.5 SENSITIVE Sensitive     Inducible Clindamycin NEGATIVE Sensitive     * RARE STAPHYLOCOCCUS AUREUS  Culture, blood (routine x 2)     Status: None   Collection Time: 12/02/18  5:53 AM  Result Value Ref Range Status   Specimen Description BLOOD LEFT HAND  Final   Special Requests   Final    BOTTLES DRAWN AEROBIC ONLY Blood Culture adequate volume   Culture   Final    NO GROWTH 5 DAYS Performed at [facility], [place] [address] [place], [place] [address]    Report Status 12/07/2018 FINAL  Final  Culture, blood (routine x 2)     Status: None   Collection Time: 12/02/18  5:53 AM  Result Value Ref Range Status   Specimen Description BLOOD LEFT HAND  Final   Special Requests   Final    BOTTLES DRAWN AEROBIC ONLY Blood Culture adequate volume   Culture   Final    NO GROWTH 5 DAYS Performed at [facility], [place] [address] [place], [place] [address]    Report Status 12/07/2018 FINAL  Final      Studies: No results found.  Scheduled Meds: . amLODipine  10 mg Oral Daily  . aspirin EC  325 mg Oral Daily  . furosemide  20 mg Oral Daily  . heparin injection (subcutaneous)  5,000 Units Subcutaneous Q8H  . insulin aspart  0-15 Units Subcutaneous Q4H  . pantoprazole  40 mg Oral Daily  . polyethylene glycol  17 g Oral Daily  . sodium chloride flush  10-40 mL Intracatheter Q12H     Continuous Infusions: . cefTRIAXone (ROCEPHIN)  IV 2 g (12/07/18 0936)     LOS: 8 days     Kayleen Memos, MD [facility] Pager [phone]  If 7PM-7AM, please contact night-coverage [email or web address] Password [facility] 12/07/2018, 3:40 PM

## 2018-12-07 NOTE — Progress Notes (Signed)
Progress Note  Patient Name: LANE ELAND Date of Encounter: 12/07/2018  Primary Cardiologist: [name], MD   Subjective   Sleeping no cardiac complaints   Inpatient Medications    Scheduled Meds: . amLODipine  10 mg Oral Daily  . aspirin EC  325 mg Oral Daily  . heparin injection (subcutaneous)  5,000 Units Subcutaneous Q8H  . insulin aspart  0-15 Units Subcutaneous Q4H  . pantoprazole  40 mg Oral Daily  . polyethylene glycol  17 g Oral Daily  . sodium chloride flush  10-40 mL Intracatheter Q12H   Continuous Infusions: . cefTRIAXone (ROCEPHIN)  IV 2 g (12/07/18 0936)   PRN Meds: acetaminophen, benzonatate, bisacodyl, fentaNYL, hydrALAZINE, oxyCODONE, sodium chloride flush   Vital Signs    Vitals:   12/06/18 1533 12/06/18 1954 12/07/18 0428 12/07/18 0643  BP: 123/72 131/67 109/71   Pulse: 67 67 77   Resp: (!) 24 18 18    Temp: 98.9 F (37.2 C) 99.8 F (37.7 C) (!) 100.7 F (38.2 C) 98.3 F (36.8 C)  TempSrc: Oral Oral Oral Oral  SpO2: 99% 100% 94%   Weight:      Height:        Intake/Output Summary (Last 24 hours) at 12/07/2018 1230 Last data filed at 12/07/2018 0439 Gross per 24 hour  Intake 390 ml  Output 1300 ml  Net -910 ml   Filed Weights   12/02/18 0500 12/03/18 0500 12/04/18 0500  Weight: 117.3 kg 113.8 kg 118.2 kg    Telemetry    SB rates 60's   ECG    SR PVC RBBB   Physical Exam   Affect appropriate Obese black male  HEENT:right IJ catheter  Neck supple with no adenopathy JVP normal no bruits no thyromegaly Lungs clear with no wheezing and good diaphragmatic motion Heart:  S1/S2 SEM through AVR no AR  murmur, no rub, gallop or click PMI normal Abdomen: benighn, BS positve, no tenderness, no AAA no bruit.  No HSM or HJR Distal pulses intact with no bruits Plus one bilateral  edema Neuro non-focal Skin warm and dry No muscular weakness    Labs    Chemistry Recent Labs  Lab 12/03/18 0325 12/04/18 0455  12/05/18 0010 12/06/18 0351  NA 146* 149* 142 139  K 3.5 3.6 3.7 4.3  CL 106 112* 105 104  CO2 26 25 25 28   GLUCOSE 118* 132* 162* 133*  BUN 40* 37* 28* 14  CREATININE 1.71* 1.41* 1.51* 1.26*  CALCIUM 9.0 9.4 9.3 9.0  ALBUMIN 3.0* 2.9*  --   --   GFRNONAA 43* 55* 51* >60  GFRAA 50* >60 59* >60  ANIONGAP 14 12 12 7      Hematology Recent Labs  Lab 12/05/18 0422 12/06/18 0351 12/07/18 0431  WBC 12.1* 11.4* 12.0*  RBC 4.67 4.49 4.47  HGB 12.3* 11.9* 12.4*  HCT 40.7 39.3 38.8*  MCV 87.2 87.5 86.8  MCH 26.3 26.5 27.7  MCHC 30.2 30.3 32.0  RDW 14.5 14.2 14.0  PLT 233 244 290    Cardiac Enzymes No results for input(s): TROPONINI in the last 168 hours. No results for input(s): TROPIPOC in the last 168 hours.   BNP No results for input(s): BNP, PROBNP in the last 168 hours.   DDimer No results for input(s): DDIMER in the last 168 hours.   Radiology    No results found.  Cardiac Studies   Echo 11/29/18:  Study Conclusions  - Left ventricle: The cavity size  was normal. Wall thickness was   increased in a pattern of severe LVH. Systolic function was   normal. The estimated ejection fraction was in the range of 50%   to 55%. Mild hypokinesis of the anteroseptal myocardium. Doppler   parameters are consistent with abnormal left ventricular   relaxation (grade 1 diastolic dysfunction). - Aortic valve: A prosthesis was present and functioning normally.   The prosthesis had a normal range of motion. The sewing ring   appeared normal, had no rocking motion, and showed no evidence of   dehiscence. There was mild regurgitation. Mean gradient (S): 10   mm Hg. Valve area (VTI): 1.69 cm^2. Valve area (Vmax): 1.63 cm^2.   Valve area (Vmean): 1.61 cm^2. - Left atrium: The atrium was severely dilated. - Right ventricle: The cavity size was mildly dilated. Wall   thickness was normal. Systolic function was moderately reduced. - Right atrium: The atrium was moderately to  severely dilated. - Atrial septum: The septum bowed from right to left, consistent   with increased right atrial pressure. - Pulmonary arteries: Systolic pressure was moderately to severely   increased. PA peak pressure: 56 mm Hg (S).  Impressions:  - Severe LVH. Moderate to severe pulmonary hypertension. Aortic   valve stable with mild regurgitation.  Patient Profile     57 y.o. male with bioprosthetic aortic valve, morbid obesity, here with acute on chronic diastolic heart failure, hypoxic respiratory failure requiring intubation and possible pneumonia.    Assessment & Plan    # Acute on chronic diastolic heart failure: Echo this admission revealed LVEF 50-55%.  BNP was 1200.  He appears to be euvolemic.  Renal function better yesterday 1.26 would resume home dose 20 mg lasix daily   # Hypoxic/hypercarbic respiratory failure: # CAP: Currently on Ceftriaxone for likely MSSA pneumonia.  Extubated 12/18.  # AKI: Baseline creatinine is around 1.3.  Stable   # Cardiogenic/septic shock: Resolved.    # Hypertension: improved on norvasc instead of ACE due to azotemia    # Elevated troponin: Initially there was concern for STEMI.  However, relatively unchanged from prior. Troponin elevated to 0.28 and flat.  Consistent with demand ischemia.  He denies chest pain.   # s/p AVR: Valve functioning properly on echo this admission.  Mean gradient 10 mmHg.  Mild AR.  # Pulmonary Hypertension: Mod-severe.  PASP 56 mmHg.  Diuresis as above. 3   For questions or updates, please contact [place] Please consult [email or web address] for contact info under        Signed, Jenkins Rouge, MD  12/07/2018, 12:30 PM

## 2018-12-07 NOTE — Clinical Social Work Note (Signed)
Clinical Social Work Assessment  Patient Details  Name: [name] MRN: [id number] Date of Birth: 08-24-1961  Date of referral:  12/07/18               Reason for consult:  Facility Placement, Discharge Planning                Permission sought to share information with:  Family Supports Permission granted to share information::  Yes, Verbal Permission Granted  Name::     [name]  Agency::  snf  Relationship::  sister  Contact Information:  [phone]  Housing/Transportation Living arrangements for the past 2 months:  Single Family Home Source of Information:  Patient Patient Interpreter Needed:  None Criminal Activity/Legal Involvement Pertinent to Current Situation/Hospitalization:  No - Comment as needed Significant Relationships:  Siblings Lives with:  Self Do you feel safe going back to the place where you live?  Yes Need for family participation in patient care:  Yes (Comment)  Care giving concerns:  No family at bedside. Patient lives at home alone but has support from family.   Social Worker assessment / plan:  CSW met patient at bedside to discuss discharge plan and offer support. CSW asked if patient would be willing to go to rehab. Patient asked if he could have rehab in the hospital and CSW stated that unfortunately it will have to be up to the MD to suggest CIR. Patient stated he will need some time to talk to family about going to a SNF. Patient stated he will reach back out to CSW when he has reached a decision  Employment status:  Retired Forensic scientist:  Medicare PT Recommendations:  [place] / Referral to community resources:  [place]  Patient/Family's Response to care:  Patient stated he needs time to talk to family before making a decision about SNF placement   Patient/Family's Understanding of and Emotional Response to Diagnosis, Current Treatment, and Prognosis:  CSW will follow patient for  support and discharge needs  Emotional Assessment Appearance:  Appears stated age Attitude/Demeanor/Rapport:  Engaged Affect (typically observed):  Pleasant Orientation:  Oriented to Self, Oriented to Place, Oriented to  Time, Oriented to Situation Alcohol / Substance use:  Not Applicable Psych involvement (Current and /or in the community):     Discharge Needs  Concerns to be addressed:  Care Coordination Readmission within the last 30 days:  No Current discharge risk:  Dependent with Mobility Barriers to Discharge:  Continued Medical Work up   [employer], LCSW 12/07/2018, 11:06 AM

## 2018-12-07 NOTE — NC FL2 (Signed)
[place] LEVEL OF CARE SCREENING TOOL     IDENTIFICATION  Patient Name: [name] Birthdate: [birth year] Sex: male Admission Date (Current Location): 11/29/2018  [facility] and [place] Number:  Herbalist and Address:  [facility] [place], [place] [address], [place], [place] [address]      Provider Number: [id number]  Attending Physician Name and Address:  Kayleen Memos, DO  Relative Name and Phone Number:  Cortlandt Capuano, [phone]    Current Level of Care: Hospital Recommended Level of Care: [place] Prior Approval Number:    Date Approved/Denied:   PASRR Number: [id number]  Discharge Plan: SNF    Current Diagnoses: Patient Active Problem List   Diagnosis Date Noted  . Hypoxia   . Respiratory failure ([place]) 11/29/2018  . Shock circulatory ([place])   . S/P AVR 04/05/2015  . Aortic stenosis 03/29/2015  . Bicuspid aortic valve 03/29/2015  . Elevated troponin 03/28/2015  . Acute on chronic systolic heart failure ([place]) 03/27/2015  . Weakness generalized 11/27/2014  . Aortic insufficiency 11/19/2014  . Congestive dilated cardiomyopathy ([place]) 11/19/2014  . Essential hypertension 11/19/2014  . 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 Chronic systolic CHF (congestive heart failure) ([place]) 11/19/2014  . SOB (shortness of breath) 11/19/2014  . Atrial flutter ([place]) 11/19/2014    Orientation RESPIRATION BLADDER Height & Weight     Self, Situation, Place, Time  O2(2L) Continent Weight: 260 lb 9.3 oz (118.2 kg) Height:  5\' 11"  (180.3 cm)  BEHAVIORAL SYMPTOMS/MOOD NEUROLOGICAL BOWEL NUTRITION STATUS      Continent Diet(regular)  AMBULATORY STATUS COMMUNICATION OF NEEDS Skin   Limited Assist Verbally Normal                       Personal Care Assistance Level of Assistance  Bathing, Feeding, Dressing Bathing Assistance: Limited assistance Feeding assistance: Independent Dressing  Assistance: Limited assistance     Functional Limitations Info  Sight, Hearing, Speech Sight Info: Adequate Hearing Info: Adequate Speech Info: Adequate    SPECIAL CARE FACTORS FREQUENCY  PT (By licensed PT), OT (By licensed OT)     PT Frequency: 5x wk OT Frequency: 5x wk            Contractures Contractures Info: Not present    Additional Factors Info  Code Status, Allergies Code Status Info: Full code Allergies Info: No known allergies           Current Medications (12/07/2018):  This is the current hospital active medication list Current Facility-Administered Medications  Medication Dose Route Frequency Provider Last Rate Last Dose  . acetaminophen (TYLENOL) tablet 650 mg  650 mg Oral Q6H PRN Arby Barrette A, NP   650 mg at 12/07/18 0510  . amLODipine (NORVASC) tablet 10 mg  10 mg Oral Daily Irene Pap N, DO   10 mg at 12/07/18 7619  . aspirin EC tablet 325 mg  325 mg Oral Daily Kris Mouton, RPH   325 mg at 12/07/18 5093  . benzonatate (TESSALON) capsule 100 mg  100 mg Oral TID PRN Judd Lien, MD   100 mg at 12/06/18 2030  . bisacodyl (DULCOLAX) suppository 10 mg  10 mg Rectal Daily PRN Jennelle Human B, NP   10 mg at 12/04/18 0944  . cefTRIAXone (ROCEPHIN) 2 g in sodium chloride 0.9 % 100 mL IVPB  2 g Intravenous Q24H Jennelle Human B, NP 200 mL/hr at 12/07/18 [phone]  2 g at 12/07/18 0936  . fentaNYL (SUBLIMAZE) bolus via infusion 50 mcg  50 mcg Intravenous Q1H PRN Carmin Muskrat, MD   50 mcg at 11/29/18 1250  . heparin injection 5,000 Units  5,000 Units Subcutaneous Q8H Irene Pap N, DO   5,000 Units at 12/07/18 0510  . hydrALAZINE (APRESOLINE) injection 10 mg  10 mg Intravenous Q6H PRN Irene Pap N, DO      . insulin aspart (novoLOG) injection 0-15 Units  0-15 Units Subcutaneous Q4H [name], MD   2 Units at 12/07/18 0825  . oxyCODONE (Oxy IR/ROXICODONE) immediate release tablet 5 mg  5 mg Oral Q4H PRN Irene Pap N, DO   5 mg at  12/07/18 0933  . pantoprazole (PROTONIX) EC tablet 40 mg  40 mg Oral Daily Kris Mouton, RPH   40 mg at 12/07/18 1610  . polyethylene glycol (MIRALAX / GLYCOLAX) packet 17 g  17 g Oral Daily Jennelle Human B, NP   17 g at 12/07/18 0825  . sodium chloride flush (NS) 0.9 % injection 10-40 mL  10-40 mL Intracatheter Q12H Rush Farmer, MD   10 mL at 12/06/18 0936  . sodium chloride flush (NS) 0.9 % injection 10-40 mL  10-40 mL Intracatheter PRN Rush Farmer, MD   10 mL at 12/07/18 9604     Discharge Medications: Please see discharge summary for a list of discharge medications.  Relevant Imaging Results:  Relevant Lab Results:   Additional Information SS# [id number]  Wende Neighbors, LCSW

## 2018-12-07 NOTE — Progress Notes (Signed)
VASCULAR LAB PRELIMINARY  PRELIMINARY  PRELIMINARY  PRELIMINARY  Right upper extremity venous duplex completed.    Preliminary report:  There is no DVT or SVT noted in the visualized veins of the right upper extremity.   Daryll Spisak, RVT 12/07/2018, 5:01 PM

## 2018-12-08 LAB — GLUCOSE, CAPILLARY
GLUCOSE-CAPILLARY: 109 mg/dL — AB (ref 70–99)
GLUCOSE-CAPILLARY: 103 mg/dL — AB (ref 70–99)
Glucose-Capillary: 106 mg/dL — ABNORMAL HIGH (ref 70–99)
Glucose-Capillary: 115 mg/dL — ABNORMAL HIGH (ref 70–99)
Glucose-Capillary: 132 mg/dL — ABNORMAL HIGH (ref 70–99)
Glucose-Capillary: 142 mg/dL — ABNORMAL HIGH (ref 70–99)

## 2018-12-08 LAB — COMPREHENSIVE METABOLIC PANEL
ALBUMIN: 2.7 g/dL — AB (ref 3.5–5.0)
ALT: 30 U/L (ref 0–44)
AST: 20 U/L (ref 15–41)
Alkaline Phosphatase: 129 U/L — ABNORMAL HIGH (ref 38–126)
Anion gap: 9 (ref 5–15)
BUN: 12 mg/dL (ref 6–20)
CHLORIDE: 102 mmol/L (ref 98–111)
CO2: 26 mmol/L (ref 22–32)
Calcium: 9.1 mg/dL (ref 8.9–10.3)
Creatinine, Ser: 1.21 mg/dL (ref 0.61–1.24)
GFR calc Af Amer: >60 mL/min (ref >60)
GFR calc non Af Amer: >60 mL/min (ref >60)
Glucose, Bld: 136 mg/dL — ABNORMAL HIGH (ref 70–99)
Potassium: 4.5 mmol/L (ref 3.5–5.1)
Sodium: 137 mmol/L (ref 135–145)
Total Bilirubin: 0.7 mg/dL (ref 0.3–1.2)
Total Protein: 7.3 g/dL (ref 6.5–8.1)

## 2018-12-08 LAB — CBC WITH DIFFERENTIAL/PLATELET
Abs Immature Granulocytes: 0.05 10*3/uL (ref 0.00–0.07)
BASOS ABS: 0.1 10*3/uL (ref 0.0–0.1)
Basophils Relative: 0 %
Eosinophils Absolute: 0.2 10*3/uL (ref 0.0–0.5)
Eosinophils Relative: 2 %
HEMATOCRIT: 38.1 % — AB (ref 39.0–52.0)
Hemoglobin: 11.7 g/dL — ABNORMAL LOW (ref 13.0–17.0)
Immature Granulocytes: 0 %
Lymphocytes Relative: 19 %
Lymphs Abs: 2.1 10*3/uL (ref 0.7–4.0)
MCH: 26.4 pg (ref 26.0–34.0)
MCHC: 30.7 g/dL (ref 30.0–36.0)
MCV: 85.8 fL (ref 80.0–100.0)
Monocytes Absolute: 0.7 10*3/uL (ref 0.1–1.0)
Monocytes Relative: 6 %
NEUTROS ABS: 8.1 10*3/uL — AB (ref 1.7–7.7)
Neutrophils Relative %: 73 %
Platelets: 300 10*3/uL (ref 150–400)
RBC: 4.44 MIL/uL (ref 4.22–5.81)
RDW: 13.8 % (ref 11.5–15.5)
WBC: 11.1 10*3/uL — ABNORMAL HIGH (ref 4.0–10.5)
nRBC: 0 % (ref 0.0–0.2)

## 2018-12-08 LAB — PROCALCITONIN: Procalcitonin: 0.21 ng/mL

## 2018-12-08 LAB — MAGNESIUM: Magnesium: 2.1 mg/dL (ref 1.7–2.4)

## 2018-12-08 LAB — PHOSPHORUS: Phosphorus: 4.1 mg/dL (ref 2.5–4.6)

## 2018-12-08 LAB — URIC ACID: Uric Acid, Serum: 6.4 mg/dL (ref 3.7–8.6)

## 2018-12-08 NOTE — Consult Note (Signed)
ORTHOPAEDIC CONSULTATION  REQUESTING PHYSICIAN: Kayleen Memos, DO  Chief Complaint: R elbow pain  HPI: [name] is a 57 y.o. male with  History of multiple medical comorbidites admitted for PNA.  Notes R elbow pain during hospitalization, atraumatic onset.  No injury.  No major change in elbow.  Limited ROM.  Past Medical History:  Diagnosis Date  . Aortic stenosis 11/19/2014  . Atrial flutter ([place])   . Benign essential HTN 11/19/2014  . Benign hypertensive heart and renal disease   . CHF (congestive heart failure) ([place])   . Chronic systolic CHF (congestive heart failure) ([place]) 11/19/2014  . Congenital insufficiency of aortic valve   . Congestive cardiomyopathy ([place])   . Congestive dilated cardiomyopathy ([place]) 11/19/2014  . Essential hypertension, malignant   . Insomnia   . Murmur   . SOB (shortness of breath)    Past Surgical History:  Procedure Laterality Date  . AORTIC VALVE REPLACEMENT N/A 04/05/2015   Procedure: AORTIC VALVE REPLACEMENT (AVR) using a 24m Edwards Aortic Magna Ease Valve ;  Surgeon: PIvin Poot MD;  [place] [facility]  Service: Open Heart Surgery;  Laterality: N/A;  . LEFT AND RIGHT HEART CATHETERIZATION WITH CORONARY ANGIOGRAM N/A 11/27/2014   Procedure: LEFT AND RIGHT HEART CATHETERIZATION WITH CORONARY ANGIOGRAM;  Surgeon: TTroy Sine MD;  [facility];  Service: Cardiovascular;  Laterality: N/A;  . MAZE N/A 04/05/2015   Procedure: MAZE;  Surgeon: PIvin Poot MD;  [place] [facility]  Service: Open Heart Surgery;  Laterality: N/A;  . REPLACEMENT ASCENDING AORTA N/A 04/05/2015   Procedure: REPLACEMENT ASCENDING AORTA with a 319mHemashield Platinum Graft;  Surgeon: PeIvin PootMD;  [place] [facility] Service: Open [place] Surgery;  Laterality: N/A;  . TEE WITHOUT CARDIOVERSION N/A 04/05/2015   Procedure: TRANSESOPHAGEAL ECHOCARDIOGRAM (TEE);  Surgeon: PeIvin PootMD;  [place] [facility] Service: Open Heart Surgery;   Laterality: N/A;   Social History   Socioeconomic History  . Marital status: Single    Spouse name: Not on file  . Number of children: Not on file  . Years of education: Not on file  . Highest education level: Not on file  Occupational History  . Not on file  Social Needs  . Financial resource strain: Not on file  . Food insecurity:    Worry: Not on file    Inability: Not on file  . Transportation needs:    Medical: Not on file    Non-medical: Not on file  Tobacco Use  . Smoking status: Never Smoker  . Smokeless tobacco: Never Used  Substance and Sexual Activity  . Alcohol use: Not on file  . Drug use: Not on file  . Sexual activity: Not on file  Lifestyle  . Physical activity:    Days per week: Not on file    Minutes per session: Not on file  . Stress: Not on file  Relationships  . Social connections:    Talks on phone: Not on file    Gets together: Not on file    Attends religious service: Not on file    Active member of club or organization: Not on file    Attends meetings of clubs or organizations: Not on file    Relationship status: Not on file  Other Topics Concern  . Not on file  Social History Narrative  . Not on file   Family History  Problem Relation Age of  Onset  . Heart attack Neg Hx    No Known Allergies Prior to Admission medications   Medication Sig Start Date End Date Taking? Authorizing Provider  benzonatate (TESSALON) 100 MG capsule Take 100 mg by mouth 3 (three) times daily as needed for cough.   Yes [provider]  aspirin EC 325 MG EC tablet Take 1 tablet (325 mg total) by mouth daily. Patient not taking: Reported on 12/01/2018 04/23/15   [name], Lodema Hong, PA-C  diphenhydrAMINE (BENADRYL) 25 mg capsule Take 1 capsule (25 mg total) by mouth at bedtime as needed for sleep. Patient not taking: Reported on 12/01/2018 04/23/15   [name], Lodema Hong, PA-C  furosemide (LASIX) 20 MG tablet Take 1 tablet (20 mg total) by mouth daily. Patient not  taking: Reported on 12/01/2018 04/23/15   Nani Skillern, PA-C  lisinopril (PRINIVIL,ZESTRIL) 5 MG tablet Take 1 tablet (5 mg total) by mouth daily. Patient not taking: Reported on 12/01/2018 04/23/15   Nani Skillern, PA-C  oxyCODONE (OXY IR/ROXICODONE) 5 MG immediate release tablet Take 1-2 tablets (5-10 mg total) by mouth every 3 (three) hours as needed for severe pain. Patient not taking: Reported on 12/01/2018 04/23/15   [name], Junie Panning R, PA-C  potassium chloride SA (KLOR-CON M20) 20 MEQ tablet Take 1 tablet (20 mEq total) by mouth daily. Patient not taking: Reported on 12/01/2018 11/20/14   Sueanne Margarita, MD   Dg Elbow Complete Right (3+view)  Result Date: 12/07/2018 CLINICAL DATA:  Right arm triceps pain, pain with bending EXAM: RIGHT ELBOW - COMPLETE 3+ VIEW COMPARISON:  None. FINDINGS: Small elbow effusion is present. Small spur versus tiny fracture fragment off the coronoid process on one view. Radial head alignment is normal. Probable spurring at the olecranon. Soft tissue swelling is present. IMPRESSION: 1. Small elbow effusion 2. Small spur versus fracture fragment adjacent to the coronoid process of the ulna Electronically Signed   By: Donavan Foil M.D.   On: 12/07/2018 17:38   Vas [place] Upper Extremity Venous Duplex  Result Date: 12/08/2018 UPPER VENOUS STUDY  Indications: Pain (triceps, elbow) Limitations: Line. Comparison Study: No prior Performing Technologist: Sharion Dove RVS  Examination Guidelines: A complete evaluation includes B-mode imaging, spectral Doppler, color Doppler, and power Doppler as needed of all accessible portions of each vessel. Bilateral testing is considered an integral part of a complete examination. Limited examinations for reoccurring indications may be performed as noted.  Right Findings: +----------+------------+----------+---------+-----------+--------------+ RIGHT     CompressiblePropertiesPhasicitySpontaneous   Summary      +----------+------------+----------+---------+-----------+--------------+ IJV                                                 Not visualized +----------+------------+----------+---------+-----------+--------------+ Subclavian                         Yes       Yes                   +----------+------------+----------+---------+-----------+--------------+ Axillary                           Yes       Yes                   +----------+------------+----------+---------+-----------+--------------+ Brachial  Full                                                 +----------+------------+----------+---------+-----------+--------------+ Radial        Full                                                 +----------+------------+----------+---------+-----------+--------------+ Ulnar                                                 visualized   +----------+------------+----------+---------+-----------+--------------+ Cephalic      Full                                                 +----------+------------+----------+---------+-----------+--------------+  Left Findings: +----------+------------+----------+---------+-----------+-------+ LEFT      CompressiblePropertiesPhasicitySpontaneousSummary +----------+------------+----------+---------+-----------+-------+ Subclavian                         Yes       Yes            +----------+------------+----------+---------+-----------+-------+  Summary:  Right: No evidence of deep vein thrombosis in the upper extremity. However, unable to visualize the internal jugular. No evidence of superficial vein thrombosis in the upper extremity. However, unable to visualize the internal jugular. No evidence of thrombosis in the . However, unable to visualize the internal jugular.  Left: No evidence of thrombosis in the subclavian.  *See table(s) above for measurements and observations.  Diagnosing physician: Monica Martinez MD  Electronically signed by Monica Martinez MD on 12/08/2018 at 10:41:30 AM.    Final    Family History Reviewed and non-contributory, no pertinent history of problems with bleeding or anesthesia      Review of Systems 14 system ROS conducted and negative except for that noted in HPI   OBJECTIVE  Vitals: Patient Vitals for the past 8 hrs:  BP  12/08/18 0528 (!) 132/52   General: Alert, no acute distress Cardiovascular: No pedal edema Respiratory: No cyanosis, no use of accessory musculature GI: No organomegaly, abdomen is soft and non-tender Skin: No lesions in the area of chief complaint other than those listed below in MSK exam.  Neurologic: Sensation intact distally save for the below mentioned MSK exam Psychiatric: Patient is competent for consent with normal mood and affect Lymphatic: No obvious swelling Extremities  GGY:IRSW, wwp hand, no palpable fullness or effusion around elbow.  ROM 10-85 deg with minimal pain, pain with further flexion, full pronosupination without any pain.      Test Results Imaging XR demonstrate a coronoid osteophyte, possible a small avulsion fracture from the ulna.    Labs cbc Recent Labs    12/07/18 0431 12/08/18 0448  WBC 12.0* 11.1*  HGB 12.4* 11.7*  HCT 38.8* 38.1*  PLT 290 300    Labs inflam No results for input(s): CRP in the last 72 hours.  Invalid input(s): ESR  Labs coag No results for input(s): INR, PTT in  the last 72 hours.  Invalid input(s): PT  Recent Labs    12/06/18 0351 12/08/18 0448  NA 139 137  K 4.3 4.5  CL 104 102  CO2 28 26  GLUCOSE 133* 136*  BUN 14 12  CREATININE 1.26* 1.21  CALCIUM 9.0 9.1     ASSESSMENT AND PLAN: 57 y.o. male with the following: R elbow stiffness, minimal concern for septic joint.    Patient has no obvious fullness or warmth, minimal pain and reasonable ROM with full pronosupination.  Extremely low concern for any septic joint clinically.  Do not think aspiration is  appropriate with this level of motion.  Plan for monitoring.  If patient changes can reassess otherwise orthopedics signing off.  No followup necessary with orthopedics unless patient desires.  Recommend outpatient OT/PT for ROM of the elbow per primary team orders.
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
## 2018-12-09 ENCOUNTER — Encounter (HOSPITAL_COMMUNITY): Payer: Self-pay | Admitting: General Practice

## 2018-12-09 ENCOUNTER — Other Ambulatory Visit: Payer: Self-pay

## 2018-12-09 DIAGNOSIS — I5043 Acute on chronic combined systolic (congestive) and diastolic (congestive) heart failure: Secondary | ICD-10-CM

## 2018-12-09 LAB — GLUCOSE, CAPILLARY
Glucose-Capillary: 107 mg/dL — ABNORMAL HIGH (ref 70–99)
Glucose-Capillary: 125 mg/dL — ABNORMAL HIGH (ref 70–99)
Glucose-Capillary: 107 mg/dL — ABNORMAL HIGH (ref 70–99)
Glucose-Capillary: 142 mg/dL — ABNORMAL HIGH (ref 70–99)

## 2018-12-09 MED ORDER — INSULIN ASPART 100 UNIT/ML ~~LOC~~ SOLN
0.0000 [IU] | Freq: Three times a day (TID) | SUBCUTANEOUS | Status: DC
  Administered 2018-12-09: 2 [IU] via SUBCUTANEOUS
  Administered 2018-12-10: 0 [IU] via SUBCUTANEOUS

## 2018-12-09 MED ORDER — COLCHICINE 0.6 MG PO TABS
0.6000 mg | ORAL_TABLET | Freq: Two times a day (BID) | ORAL | Status: DC
  Administered 2018-12-09 – 2018-12-10 (×3): 0.6 mg via ORAL
  Filled 2018-12-09 (×3): qty 1
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
## 2018-12-09 NOTE — Progress Notes (Signed)
PT Cancellation Note  Patient Details Name: [name] MRN: [id number] DOB: 1961/07/12   Cancelled Treatment:    Reason Eval/Treat Not Completed: Patient declined, no reason specified Patient refusing to mobilize with PT. Education on importance of OOB mobility for continued strength and functional mobility progressions. Patient continuing to adamantly refuse mobility. Will follow up as time allows.    Lanney Gins, PT, DPT Supplemental Physical Therapist 12/09/18 10:05 AM Pager: [phone] Office: [phone]

## 2018-12-09 NOTE — Progress Notes (Addendum)
Progress Note  Patient Name: [name] Date of Encounter: 12/09/2018  Primary Cardiologist: [name], MD   Subjective   Doing ok. No complaints. He is asking when he can go home.   Inpatient Medications    Scheduled Meds: . amLODipine  10 mg Oral Daily  . aspirin EC  325 mg Oral Daily  . furosemide  20 mg Oral Daily  . heparin injection (subcutaneous)  5,000 Units Subcutaneous Q8H  . insulin aspart  0-15 Units Subcutaneous TID WC  . pantoprazole  40 mg Oral Daily  . polyethylene glycol  17 g Oral Daily  . sodium chloride flush  10-40 mL Intracatheter Q12H   Continuous Infusions:  PRN Meds: acetaminophen, benzonatate, bisacodyl, fentaNYL, hydrALAZINE, oxyCODONE, phenol, sodium chloride flush   Vital Signs    Vitals:   12/08/18 0528 12/08/18 1513 12/08/18 2042 12/09/18 0532  BP: (!) 132/52 125/75 121/80 132/76  Pulse:  (!) 120 73 (!) 41  Resp:  (!) 22 18 18   Temp:  99.5 F (37.5 C) (!) 100.5 F (38.1 C) 99.6 F (37.6 C)  TempSrc:  Oral Oral Oral  SpO2:  98% 97% 95%  Weight:      Height:        Intake/Output Summary (Last 24 hours) at 12/09/2018 1941 Last data filed at 12/09/2018 0300 Gross per 24 hour  Intake 480 ml  Output 1325 ml  Net -845 ml   Filed Weights   12/02/18 0500 12/03/18 0500 12/04/18 0500  Weight: 117.3 kg 113.8 kg 118.2 kg    Telemetry    NSR - Personally Reviewed  ECG    Sinus w/ PACs and RBBB - Personally Reviewed  Physical Exam   GEN: No acute distress.   Neck: No JVD Cardiac: RRR, no murmurs, rubs, or gallops.  Respiratory: Clear to auscultation bilaterally. GI: Soft, nontender, non-distended  MS: No edema; No deformity. Neuro:  Nonfocal  Psych: Normal affect   Labs    Chemistry Recent Labs  Lab 12/03/18 0325 12/04/18 0455 12/05/18 0010 12/06/18 0351 12/08/18 0448  NA 146* 149* 142 139 137  K 3.5 3.6 3.7 4.3 4.5  CL 106 112* 105 104 102  CO2 26 25 25 28 26   GLUCOSE 118* 132* 162* 133* 136*    BUN 40* 37* 28* 14 12  CREATININE 1.71* 1.41* 1.51* 1.26* 1.21  CALCIUM 9.0 9.4 9.3 9.0 9.1  PROT  --   --   --   --  7.3  ALBUMIN 3.0* 2.9*  --   --  2.7*  AST  --   --   --   --  20  ALT  --   --   --   --  30  ALKPHOS  --   --   --   --  129*  BILITOT  --   --   --   --  0.7  GFRNONAA 43* 55* 51* >60 >60  GFRAA 50* >60 59* >60 >60  ANIONGAP 14 12 12 7 9      Hematology Recent Labs  Lab 12/06/18 0351 12/07/18 0431 12/08/18 0448  WBC 11.4* 12.0* 11.1*  RBC 4.49 4.47 4.44  HGB 11.9* 12.4* 11.7*  HCT 39.3 38.8* 38.1*  MCV 87.5 86.8 85.8  MCH 26.5 27.7 26.4  MCHC 30.3 32.0 30.7  RDW 14.2 14.0 13.8  PLT 244 290 300    Cardiac EnzymesNo results for input(s): TROPONINI in the last 168 hours. No results for input(s): TROPIPOC in  the last 168 hours.   BNPNo results for input(s): BNP, PROBNP in the last 168 hours.   DDimer No results for input(s): DDIMER in the last 168 hours.   Radiology    Dg Elbow Complete Right (3+view)  Result Date: 12/07/2018 CLINICAL DATA:  Right arm triceps pain, pain with bending EXAM: RIGHT ELBOW - COMPLETE 3+ VIEW COMPARISON:  None. FINDINGS: Small elbow effusion is present. Small spur versus tiny fracture fragment off the coronoid process on one view. Radial head alignment is normal. Probable spurring at the olecranon. Soft tissue swelling is present. IMPRESSION: 1. Small elbow effusion 2. Small spur versus fracture fragment adjacent to the coronoid process of the ulna Electronically Signed   By: Donavan Foil M.D.   On: 12/07/2018 17:38   Vas [place] Upper Extremity Venous Duplex  Result Date: 12/08/2018 UPPER VENOUS STUDY  Indications: Pain (triceps, elbow) Limitations: Line. Comparison Study: No prior Performing Technologist: Sharion Dove RVS  Examination Guidelines: A complete evaluation includes B-mode imaging, spectral Doppler, color Doppler, and power Doppler as needed of all accessible portions of each vessel. Bilateral testing is considered  an integral part of a complete examination. Limited examinations for reoccurring indications may be performed as noted.  Right Findings: +----------+------------+----------+---------+-----------+--------------+ RIGHT     CompressiblePropertiesPhasicitySpontaneous   Summary     +----------+------------+----------+---------+-----------+--------------+ IJV                                                 Not visualized +----------+------------+----------+---------+-----------+--------------+ Subclavian                         Yes       Yes                   +----------+------------+----------+---------+-----------+--------------+ Axillary                           Yes       Yes                   +----------+------------+----------+---------+-----------+--------------+ Brachial      Full                                                 +----------+------------+----------+---------+-----------+--------------+ Radial        Full                                                 +----------+------------+----------+---------+-----------+--------------+ Ulnar                                                 visualized   +----------+------------+----------+---------+-----------+--------------+ Cephalic      Full                                                 +----------+------------+----------+---------+-----------+--------------+  Left Findings: +----------+------------+----------+---------+-----------+-------+ LEFT      CompressiblePropertiesPhasicitySpontaneousSummary +----------+------------+----------+---------+-----------+-------+ Subclavian                         Yes       Yes            +----------+------------+----------+---------+-----------+-------+  Summary:  Right: No evidence of deep vein thrombosis in the upper extremity. However, unable to visualize the internal jugular. No evidence of superficial vein thrombosis in the upper extremity.  However, unable to visualize the internal jugular. No evidence of thrombosis in the . However, unable to visualize the internal jugular.  Left: No evidence of thrombosis in the subclavian.  *See table(s) above for measurements and observations.  Diagnosing physician: Monica Martinez MD Electronically signed by Monica Martinez MD on 12/08/2018 at 10:41:30 AM.    Final     Cardiac Studies   Echo 11/29/18: Study Conclusions  - Left ventricle: The cavity size was normal. Wall thickness was increased in a pattern of severe LVH. Systolic function was normal. The estimated ejection fraction was in the range of 50% to 55%. Mild hypokinesis of the anteroseptal myocardium. Doppler parameters are consistent with abnormal left ventricular relaxation (grade 1 diastolic dysfunction). - Aortic valve: A prosthesis was present and functioning normally. The prosthesis had a normal range of motion. The sewing ring appeared normal, had no rocking motion, and showed no evidence of dehiscence. There was mild regurgitation. Mean gradient (S): 10 mm Hg. Valve area (VTI): 1.69 cm^2. Valve area (Vmax): 1.63 cm^2. Valve area (Vmean): 1.61 cm^2. - Left atrium: The atrium was severely dilated. - Right ventricle: The cavity size was mildly dilated. Wall thickness was normal. Systolic function was moderately reduced. - Right atrium: The atrium was moderately to severely dilated. - Atrial septum: The septum bowed from right to left, consistent with increased right atrial pressure. - Pulmonary arteries: Systolic pressure was moderately to severely increased. PA peak pressure: 56 mm Hg (S).  Impressions:  - Severe LVH. Moderate to severe pulmonary hypertension. Aortic valve stable with mild regurgitation.  Patient Profile     57 y.o. male with bioprosthetic aortic valve, morbid obesity, here with acute on chronic diastolic heart failure, hypoxic respiratory failure requiring  intubation and possible pneumonia.    Assessment & Plan    1. Acute on chronic diastolic heart failure: Echo this admission revealed normal LVEF 50-55%.  BNP was 1200 on admit. He was treated with IV diuretics and now on PO Lasix, 20 mg daily. He appears to be euvolemic. Renal function and BP stable. Continue daily Lasix on discharge. We recommend daily weights when he returns home and low sodium diet.   2.  Hypoxic/hypercarbic respiratory failure:  2/2 to PNA and acute CHF.  Resolved.   3.  CAP:  Improved. Treated with antibiotics. Extubated 12/18.  4. AKI: Baseline creatinine is around 1.3. Renal function stable. BMP yesterday showed SCr at 1.21.   5. Cardiogenic/septic shock: Resolved.    6. Hypertension: well controlled on current regimen. Continue amlodipine and lasix.    7. Elevated troponin: Initially there was concern for STEMI.  However, EKG relatively unchanged from prior. Troponin elevated to 0.28 and flat.  Consistent with demand ischemia.  He denies chest pain. No plans for ischemic w/u at this time.   8. s/p AVR: Valve functioning properly on echo this admission.  Mean gradient 10 mmHg.  Mild AR.  9.  Pulmonary Hypertension: Mod-severe.  PASP 56 mmHg.  Continue  diuresis as noted above.   Stable from a cardiac standpoint. Discharge TBD by primary team. We will arrange hospital f/u. MD to follow with further recommendations.    For questions or updates, please contact [place] Please consult [email or web address] for contact info under        Signed, Lyda Jester, PA-C  12/09/2018, 8:22 AM    Attending Note:   The patient was seen and examined.  Agree with assessment and plan as noted above.  Changes made to the above note as needed.  Patient seen and independently examined with Ellen Henri, PA .   We discussed all aspects of the encounter. I agree with the assessment and plan as stated above.  1.   1.  Acute on chronic diastolic congestive heart  failure: The patient seems to be breathing quite a bit better.  His ejection fraction is normal by echocardiogram this morning.  He has been on IV and then p.o. diuretics and is feeling quite a bit better.  I would continue current dose of Lasix on discharge.  We will need to follow-up with Dr. Radford Pax in the office.  2.  Respiratory failure, this is secondary to his pneumonia and acute diastolic congestive heart failure.  This is resolved.  3.  Hypertensive heart disease with CHF: This has improved.  Continue amlodipine and Lasix.  4.  Elevated troponin level: His troponins are only minimally elevated with a flat trend.  He had an normal heart catheterization several years ago prior to his aortic valve replacement.  5.  Prosthetic aortic valve: His valve is functioning normally.   I have spent a total of 40 minutes with patient reviewing hospital  notes , telemetry, EKGs, labs and examining patient as well as establishing an assessment and plan that was discussed with the patient. > 50% of time was spent in direct patient care.  Patient is very stable.  I think that he can be discharged quite soon-possibly today or tomorrow depending on the plans from the internal medicine standpoint.  Thayer Headings, Brooke Bonito., MD, [facility] 12/09/2018, 10:45 AM [address],  [place] Pager [phone]

## 2018-12-09 NOTE — Care Management Note (Addendum)
Case Management Note  Patient Details  Name: [name] MRN: [id number] Date of Birth: 1961-02-09  Subjective/Objective:  SOB/ PNA/ CHF. Hx of atrial flutter, bicuspid aortic valve with stenosis s/p AVR, HTN, sCHF, CKD. From home alone. Pt states home address: [address] [place] [address].  cell # [phone].  12/09/2018 @ 1400 Pt declined SNF placement. Would not initially give NCM home address in order to set up home health services. Pt's behavior abnormal, asked if NCM could take him home however still wouldn't provide a home address.    Psych eval pending to determined capacity.   PCP: Ascencion Dike (Sister) Trin[place] Post [phone]      Action/Plan: Transition to home with home health services vs SNF/rehab. Pt to discuss with sister disposition plan and states will f/u with NCM, CSW.  Expected Discharge Date:                  Expected Discharge Plan:     In-House Referral:  Clinical Social Work  Discharge planning Services  CM Consult  Post Acute Care Choice:    Choice offered to:  Patient  DME Arranged:    DME Agency:     HH Arranged:   RN,PT,OT,NA HH Agency:   [facility]  Status of Service:  In process, will continue to follow  If discussed at Long Length of Stay Meetings, dates discussed:    Additional Comments: Pt states home address: [address] [place]. cell # [phone].  Whitman Hero [place], RN 12/09/2018, 10:10 AM

## 2018-12-09 NOTE — Care Management Important Message (Signed)
Important Message  Patient Details  Name: [name] MRN: [id number] Date of Birth: [birth year]   Medicare Important Message Given:  Yes    [name] 12/09/2018, 4:40 PM

## 2018-12-10 LAB — GLUCOSE, CAPILLARY: Glucose-Capillary: 104 mg/dL — ABNORMAL HIGH (ref 70–99)

## 2018-12-10 MED ORDER — FUROSEMIDE 20 MG PO TABS: 20.0000 mg | ORAL_TABLET | Freq: Every day | ORAL | 0 refills | Status: DC

## 2018-12-10 MED ORDER — AMLODIPINE BESYLATE 10 MG PO TABS: 10.0000 mg | ORAL_TABLET | Freq: Every day | ORAL | 0 refills | Status: DC

## 2018-12-10 MED ORDER — PREDNISONE 10 MG PO TABS: 40.0000 mg | ORAL_TABLET | Freq: Every day | ORAL | 0 refills | Status: AC

## 2018-12-10 MED ORDER — ASPIRIN 325 MG PO TBEC: 325.0000 mg | DELAYED_RELEASE_TABLET | Freq: Every day | ORAL | 0 refills | Status: DC

## 2018-12-10 NOTE — Progress Notes (Signed)
  Patient Saturations on Room Air at Rest =96 %  Patient Saturations on Room Air while Ambulating down the hall O2 sat =96 %.

## 2018-12-10 NOTE — Progress Notes (Signed)
Physical Therapy Treatment Patient Details Name: [name] MRN: [id number] DOB: [birth year] Today's Date: 12/10/2018    History of Present Illness Pt adm with acute hypoxic respiratory failure due to PNA vs CHF. Pt intubated 12/13-12/18. PMH - AVR, CHF, HTN,    PT Comments    Patient seen for mobility progression - up walking in hallway with nursing staff. Patient requiring no hands on assist or AD for gait. General supervision for mobility for safety. Ambulating 50 feet x 3 in hallway with PT. Unaware of deficits/safety awareness in hallway with patient at one time stopping to bend over to perform calf stretches. Will recommend HHPT if patient has 24hr/supervision at home for safety, otherwise may still consider short term SNF to progress safe and independent functional mobility. PT to continue to follow.    Follow Up Recommendations  Home health PT;SNF;Supervision/Assistance - 24 hour     Equipment Recommendations  None recommended by PT    Recommendations for Other Services       Precautions / Restrictions Precautions Precautions: Fall Restrictions Weight Bearing Restrictions: No    Mobility  Bed Mobility               General bed mobility comments: patient up with nursing staff  Transfers Overall transfer level: Needs assistance Equipment used: None Transfers: Sit to/from Stand Sit to Stand: Supervision         General transfer comment: supervision for safety - no physical assist  Ambulation/Gait Ambulation/Gait assistance: Supervision;Min guard Gait Distance (Feet): 50 Feet(3 reps) Assistive device: None Gait Pattern/deviations: Step-through pattern;Decreased stride length;Drifts right/left Gait velocity: decr   General Gait Details: decreased of awareness of safety/surroundings; at one point reaching for handrail in hallway and bending over for calf stretch - no LOB; cueing for safety throughout   Stairs             Wheelchair  Mobility    Modified Rankin (Stroke Patients Only)       Balance Overall balance assessment: Needs assistance Sitting-balance support: No upper extremity supported;Feet supported Sitting balance-Leahy Scale: Good     Standing balance support: No upper extremity supported;During functional activity Standing balance-Leahy Scale: Fair Standing balance comment: no UE support; mild balance deficits - no LOB                            Cognition Arousal/Alertness: Awake/alert Behavior During Therapy: WFL for tasks assessed/performed Overall Cognitive Status: No family/caregiver present to determine baseline cognitive functioning Area of Impairment: Following commands;Safety/judgement                       Following Commands: Follows one step commands consistently;Follows multi-step commands with increased time Safety/Judgement: Decreased awareness of safety;Decreased awareness of deficits     General Comments: fluctuating levels of awareness and deficits      Exercises      General Comments General comments (skin integrity, edema, etc.): patietn very motivated to return home      Pertinent Vitals/Pain Pain Assessment: No/denies pain    Home Living                      Prior Function            PT Goals (current goals can now be found in the care plan section) Acute Rehab PT Goals Patient Stated Goal: go to friend's apartment PT Goal Formulation: With patient Time For Goal  Achievement: 12/19/18 Potential to Achieve Goals: Good Progress towards PT goals: Progressing toward goals    Frequency    Min 3X/week      PT Plan Current plan remains appropriate    Co-evaluation              AM-PAC PT "6 Clicks" Mobility   Outcome Measure  Help needed turning from your back to your side while in a flat bed without using bedrails?: None Help needed moving from lying on your back to sitting on the side of a flat bed without using  bedrails?: None Help needed moving to and from a bed to a chair (including a wheelchair)?: A Little Help needed standing up from a chair using your arms (e.g., wheelchair or bedside chair)?: A Little Help needed to walk in hospital room?: A Little Help needed climbing 3-5 steps with a railing? : A Lot 6 Click Score: 19    End of Session   Activity Tolerance: Patient tolerated treatment well Patient left: in chair;with call bell/phone within reach Nurse Communication: Mobility status PT Visit Diagnosis: Unsteadiness on feet (R26.81);Other abnormalities of gait and mobility (R26.89);Pain     Time: 1751-0258 PT Time Calculation (min) (ACUTE ONLY): 13 min  Charges:  $Gait Training: 8-22 mins                     Lanney Gins, PT, DPT Supplemental Physical Therapist 12/10/18 11:01 AM Pager: [phone] Office: [phone]
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
## 2018-12-10 NOTE — Progress Notes (Signed)
[name] to be D/C'd per MD order.  Discussed with the patient and all questions fully answered.  VSS, Skin clean, dry and intact without evidence of skin break down, no evidence of skin tears noted. IV catheter discontinued intact. Site without signs and symptoms of complications. Dressing and pressure applied.  An After Visit Summary was printed and given to the patient. Patient received prescription.  D/c education completed with patient/family including follow up instructions, medication list, d/c activities limitations if indicated, with other d/c instructions as indicated by MD - patient able to verbalize understanding, all questions fully answered.   Patient instructed to return to ED, call 911, or call MD for any changes in condition.   Patient escorted via [place], and D/C home via private auto.   Lorenza Evangelist [facility] 12/10/2018 11:49 AM

## 2019-01-01 ENCOUNTER — Encounter: Payer: Self-pay | Admitting: Gastroenterology

## 2019-01-02 ENCOUNTER — Encounter: Payer: Self-pay | Admitting: Physician Assistant
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
## 2019-01-03 ENCOUNTER — Ambulatory Visit (INDEPENDENT_AMBULATORY_CARE_PROVIDER_SITE_OTHER): Payer: Medicare Other | Admitting: Physician Assistant

## 2019-01-03 ENCOUNTER — Encounter: Payer: Self-pay | Admitting: Physician Assistant

## 2019-01-03 VITALS — BP 150/80 | HR 69 | Ht 71.0 in | Wt 265.8 lb

## 2019-01-03 DIAGNOSIS — I5043 Acute on chronic combined systolic (congestive) and diastolic (congestive) heart failure: Secondary | ICD-10-CM

## 2019-01-03 DIAGNOSIS — I359 Nonrheumatic aortic valve disorder, unspecified: Secondary | ICD-10-CM

## 2019-01-03 DIAGNOSIS — I272 Pulmonary hypertension, unspecified: Secondary | ICD-10-CM

## 2019-01-03 DIAGNOSIS — I4892 Unspecified atrial flutter: Secondary | ICD-10-CM | POA: Diagnosis not present

## 2019-01-03 NOTE — Patient Instructions (Addendum)
We cannot safely adjust your medications without knowing exactly what you are currently taking. Please call the on-call line today, and speak with the on-call dr and give them your list.  Also, bring your bottles of medication with you to your next appointment, 01/07/2019 WITH SCOTT WEAVER, PA-C, ARRIVE AT 3:00   Endocarditis Information  You may be at risk for developing endocarditis since you have  an artificial heart valve  or a repaired heart valve. Endocarditis is an infection of the lining of the heart or heart valves.   Certain surgical and dental procedures may put you at risk,  such as teeth cleaning or other dental procedures or any surgery involving the respiratory, urinary, gastrointestinal tract, gallbladder or prostate.   Notify your doctor or dentist before having any invasive procedures. You will need to take antibiotics before certain procedures.   To prevent endocarditis, maintain good oral health. Seek prompt medical attention for any mouth/gum, skin or urinary tract infections.  For patients with congestive heart failure, we give them these special instructions:  1. Follow a low-salt diet - you are allowed no more than 2,000mg  of sodium per day. Watch your fluid intake. In general, you should not be taking in more than 2 liters of fluid per day (no more than 8 glasses per day). This includes sources of water in foods like soup, coffee, tea, milk, etc. 2. Weigh yourself on the same scale at same time of day and keep a log. 3. Call your doctor: (Anytime you feel any of the following symptoms)  - 3lb weight gain overnight or 5lb within a few days - Shortness of breath, with or without a dry hacking cough  - Swelling in the hands, feet or stomach  - If you have to sleep on extra pillows at night in order to breathe   IT IS IMPORTANT TO LET YOUR DOCTOR KNOW EARLY ON IF YOU ARE HAVING SYMPTOMS SO WE CAN HELP YOU!

## 2019-01-07 ENCOUNTER — Encounter: Payer: Self-pay | Admitting: Physician Assistant

## 2019-01-07 ENCOUNTER — Telehealth: Payer: Self-pay | Admitting: Physician Assistant

## 2019-01-07 ENCOUNTER — Ambulatory Visit (INDEPENDENT_AMBULATORY_CARE_PROVIDER_SITE_OTHER): Payer: Medicare Other | Admitting: Physician Assistant

## 2019-01-07 VITALS — BP 116/70 | HR 60 | Ht 71.0 in | Wt 253.1 lb

## 2019-01-07 DIAGNOSIS — I110 Hypertensive heart disease with heart failure: Secondary | ICD-10-CM | POA: Diagnosis not present

## 2019-01-07 DIAGNOSIS — Z952 Presence of prosthetic heart valve: Secondary | ICD-10-CM | POA: Diagnosis not present

## 2019-01-07 DIAGNOSIS — I440 Atrioventricular block, first degree: Secondary | ICD-10-CM

## 2019-01-07 DIAGNOSIS — I4892 Unspecified atrial flutter: Secondary | ICD-10-CM

## 2019-01-07 DIAGNOSIS — I5032 Chronic diastolic (congestive) heart failure: Secondary | ICD-10-CM | POA: Diagnosis not present
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
## 2019-01-07 NOTE — Patient Instructions (Signed)
Medication Instructions:  Your physician has recommended you make the following change in your medication:  1. STOP COREG - DO NOT TAKE TONIGHT. TAKE LAST DOSE TOMORROW AM (1/22) AND THEN STOP.  If you need a refill on your cardiac medications before your next appointment, please call your pharmacy.   Lab work: NONE  If you have labs (blood work) drawn today and your tests are completely normal, you will receive your results only by: [name] MyChart Message (if you have MyChart) OR . A paper copy in the mail If you have any lab test that is abnormal or we need to change your treatment, we will call you to review the results.  Testing/Procedures: NONE  Follow-Up: At [facility], you and your health needs are our priority.  As part of our continuing mission to provide you with exceptional heart care, we have created designated Provider Care Teams.  These Care Teams include your primary Cardiologist (physician) and Advanced Practice Providers (APPs -  Physician Assistants and Nurse Practitioners) who all work together to provide you with the care you need, when you need it. . You will need a follow up appointment in:  2  months. You may see [name], MD or one of the following Advanced Practice Providers on your designated Care Team . Your physician recommends that you schedule a follow-up appointment in: 1-2 WEEKS FOR EKG AND BLOOD PRESSURE CHECK.   Any Other Special Instructions Will Be Listed Below (If Applicable).

## 2019-01-07 NOTE — Telephone Encounter (Signed)
Called pt back re: med list. Pt scheduled to see Richardson Dopp, PA-C, today @ 3:15 to and to just bring his medicine bottles with him an they will fill what's needed. Pt agreed and verbalized understanding

## 2019-01-07 NOTE — Telephone Encounter (Signed)
New message   Patient left after hours message to call in his medication per Dayna Dunn:  Isosorb Mono 10 mg taken 1 time daily Carvedilol 3.125 Mg taken by mouth twice daily Hydralazine 25mg  taken by mouth twice daily Furosemide 40 mg taken twice daily 81 mg  Baby aspirin [place]iss Herbal Laxative taken 2 times daily Colchicine 0.6 mg taken once a day as needed  Rosuvastatin 10 mg taken twice daily

## 2019-01-08 ENCOUNTER — Encounter: Payer: Self-pay | Admitting: *Deleted

## 2019-01-09 ENCOUNTER — Telehealth: Payer: Self-pay | Admitting: *Deleted

## 2019-01-09 NOTE — Telephone Encounter (Signed)
Late entry. I was the rooming CMA at patients visit with Richardson Dopp, PA on 01/07/19. On the way to the scale patient informed me that he was going to remove his heavy sweater (hoodie) prior to weighing. I told him that was fine and he went on to tell me that he had a big surprise for me and he had lost a significant amount of weight since last weeks office visit. Before I knew it he was removing his shirt. I then began reviewing his med list and papers that he had handed me on the clipboard from check-in and when I looked away from the clipboard to acknowledge his weight and ask him his height, he had also removed his pants and had an exposed erection. I immediately turned around and had my back facing him to give him privacy and allow him to get dressed. I waited approximately thirty seconds and then turned back around. He wasn't fully dressed but almost was. He proceeded to look me directly in the eyes, make comments about me, to me and tell me that he needed to find him a "Becky" immediately followed by "Oops, I might have just crossed the line when I said that". I  proceeded to tell him to follow me back to the exam room . Once we were in the exam room he continued passing comments while I was trying to review his meds and take his vital signs.

## 2019-01-09 NOTE — Telephone Encounter (Signed)
I called [name] to make him aware that his behavior and comments made more than one of our staff members uncomfortable during his visit on Tuesday, 01/07/19. [name] was a little scattered in his immediate response: he quickly cut me off and changed the subject to discuss his history of cardiac care. I rounded the conversation back around to his interaction with staff the other day and let him know:  1) in our office, patients are welcome to take off their jacket and/or outerwear, however, they must leave their shirt and pants on when weighing at the scales. 2) he made inappropriate comments that made our staff uncomfortable. 3) he should never brush up against or make physical contact that isn't pertinent to his medical care with any healthcare professional.  [name] stated that he asked if he could weigh in his boxers prior to taking his clothes off and he said that we gave him permission. He agreed that in the future he would leave all of his clothes on.  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  [name] said "I never got on top of anyone" - I clarified that he didn't "get on top of" anyone, however, he had plenty of room to walk without brushing up against her.  I reiterated that he is expected to leave his clothes on, refrain from making what he feels may be flattering or charming comments and just stick to professional conversation and physical interactions during his future visits.  [name] abruptly ended the conversation by stating that he was already on the other line with attorney and he needed to get back to that call. He started to talk really fast and his words ran together - I didn't understand what he was trying to say and before I could ask him to repeat what he was saying I heard him start talking to someone else and then the line went dead.

## 2019-01-17 ENCOUNTER — Ambulatory Visit: Payer: Self-pay | Admitting: Gastroenterology

## 2019-01-22 ENCOUNTER — Ambulatory Visit: Payer: Medicare Other

## 2019-01-30 ENCOUNTER — Encounter: Payer: Self-pay | Admitting: Gastroenterology

## 2019-03-07 ENCOUNTER — Encounter: Payer: Self-pay | Admitting: Physician Assistant

## 2019-03-16 ENCOUNTER — Telehealth: Payer: Self-pay | Admitting: Physician Assistant

## 2019-03-16 NOTE — Telephone Encounter (Signed)
Called patient to change appt on 3/31 to telehealth visit. He requested that the appt be canceled.  He noted that he does not plan to return to our office. He notes he was accused of "some things" when last seen.  He notes his attorney is looking into the situation right now.  I explained to him I was calling about the appt on 3/31, not his last visit. He expressed his desire to cancel his appt on 3/31.  I have canceled his appt. Richardson Dopp, PA-C    03/16/2019 3:28 PM

## 2019-03-18 ENCOUNTER — Ambulatory Visit: Payer: Medicare Other | Admitting: Physician Assistant

## 2019-05-11 ENCOUNTER — Emergency Department (HOSPITAL_COMMUNITY): Payer: Medicare Other

## 2019-05-11 ENCOUNTER — Encounter (HOSPITAL_COMMUNITY): Payer: Self-pay | Admitting: Emergency Medicine

## 2019-05-11 ENCOUNTER — Inpatient Hospital Stay (HOSPITAL_COMMUNITY): Payer: Medicare Other

## 2019-05-11 ENCOUNTER — Inpatient Hospital Stay (HOSPITAL_COMMUNITY)
Admission: EM | Admit: 2019-05-11 | Discharge: 2019-05-23 | DRG: 208 | Disposition: A | Discharge status: Home / Self Care | Payer: Medicare Other | Attending: Family Medicine | Admitting: Family Medicine

## 2019-05-11 ENCOUNTER — Other Ambulatory Visit: Payer: Self-pay

## 2019-05-11 DIAGNOSIS — K644 Residual hemorrhoidal skin tags: Secondary | ICD-10-CM | POA: Diagnosis present

## 2019-05-11 DIAGNOSIS — E876 Hypokalemia: Secondary | ICD-10-CM | POA: Diagnosis not present

## 2019-05-11 DIAGNOSIS — I498 Other specified cardiac arrhythmias: Secondary | ICD-10-CM

## 2019-05-11 DIAGNOSIS — I495 Sick sinus syndrome: Secondary | ICD-10-CM | POA: Diagnosis present

## 2019-05-11 DIAGNOSIS — D751 Secondary polycythemia: Secondary | ICD-10-CM | POA: Diagnosis present

## 2019-05-11 DIAGNOSIS — I5021 Acute systolic (congestive) heart failure: Secondary | ICD-10-CM

## 2019-05-11 DIAGNOSIS — J9601 Acute respiratory failure with hypoxia: Principal | ICD-10-CM | POA: Diagnosis present

## 2019-05-11 DIAGNOSIS — R52 Pain, unspecified: Secondary | ICD-10-CM

## 2019-05-11 DIAGNOSIS — J9600 Acute respiratory failure, unspecified whether with hypoxia or hypercapnia: Secondary | ICD-10-CM

## 2019-05-11 DIAGNOSIS — Z20828 Contact with and (suspected) exposure to other viral communicable diseases: Secondary | ICD-10-CM | POA: Diagnosis present

## 2019-05-11 DIAGNOSIS — I5043 Acute on chronic combined systolic (congestive) and diastolic (congestive) heart failure: Secondary | ICD-10-CM | POA: Diagnosis present

## 2019-05-11 DIAGNOSIS — T17990A Other foreign object in respiratory tract, part unspecified in causing asphyxiation, initial encounter: Secondary | ICD-10-CM | POA: Diagnosis not present

## 2019-05-11 DIAGNOSIS — Z952 Presence of prosthetic heart valve: Secondary | ICD-10-CM

## 2019-05-11 DIAGNOSIS — K2970 Gastritis, unspecified, without bleeding: Secondary | ICD-10-CM | POA: Diagnosis present

## 2019-05-11 DIAGNOSIS — I513 Intracardiac thrombosis, not elsewhere classified: Secondary | ICD-10-CM | POA: Diagnosis not present

## 2019-05-11 DIAGNOSIS — D500 Iron deficiency anemia secondary to blood loss (chronic): Secondary | ICD-10-CM | POA: Diagnosis present

## 2019-05-11 DIAGNOSIS — Z4659 Encounter for fitting and adjustment of other gastrointestinal appliance and device: Secondary | ICD-10-CM

## 2019-05-11 DIAGNOSIS — K921 Melena: Secondary | ICD-10-CM | POA: Diagnosis not present

## 2019-05-11 DIAGNOSIS — G9341 Metabolic encephalopathy: Secondary | ICD-10-CM | POA: Diagnosis not present

## 2019-05-11 DIAGNOSIS — J811 Chronic pulmonary edema: Secondary | ICD-10-CM

## 2019-05-11 DIAGNOSIS — I2721 Secondary pulmonary arterial hypertension: Secondary | ICD-10-CM

## 2019-05-11 DIAGNOSIS — I712 Thoracic aortic aneurysm, without rupture: Secondary | ICD-10-CM | POA: Diagnosis present

## 2019-05-11 DIAGNOSIS — I472 Ventricular tachycardia: Secondary | ICD-10-CM | POA: Diagnosis present

## 2019-05-11 DIAGNOSIS — I130 Hypertensive heart and chronic kidney disease with heart failure and stage 1 through stage 4 chronic kidney disease, or unspecified chronic kidney disease: Secondary | ICD-10-CM | POA: Diagnosis present

## 2019-05-11 DIAGNOSIS — I480 Paroxysmal atrial fibrillation: Secondary | ICD-10-CM | POA: Diagnosis not present

## 2019-05-11 DIAGNOSIS — J151 Pneumonia due to Pseudomonas: Secondary | ICD-10-CM | POA: Diagnosis present

## 2019-05-11 DIAGNOSIS — K250 Acute gastric ulcer with hemorrhage: Secondary | ICD-10-CM

## 2019-05-11 DIAGNOSIS — I371 Nonrheumatic pulmonary valve insufficiency: Secondary | ICD-10-CM | POA: Diagnosis not present

## 2019-05-11 DIAGNOSIS — Z7982 Long term (current) use of aspirin: Secondary | ICD-10-CM

## 2019-05-11 DIAGNOSIS — R34 Anuria and oliguria: Secondary | ICD-10-CM | POA: Diagnosis not present

## 2019-05-11 DIAGNOSIS — I82409 Acute embolism and thrombosis of unspecified deep veins of unspecified lower extremity: Secondary | ICD-10-CM | POA: Diagnosis not present

## 2019-05-11 DIAGNOSIS — N183 Chronic kidney disease, stage 3 (moderate): Secondary | ICD-10-CM | POA: Diagnosis present

## 2019-05-11 DIAGNOSIS — K648 Other hemorrhoids: Secondary | ICD-10-CM | POA: Diagnosis present

## 2019-05-11 DIAGNOSIS — I440 Atrioventricular block, first degree: Secondary | ICD-10-CM | POA: Diagnosis not present

## 2019-05-11 DIAGNOSIS — Z7901 Long term (current) use of anticoagulants: Secondary | ICD-10-CM | POA: Diagnosis not present

## 2019-05-11 DIAGNOSIS — I5082 Biventricular heart failure: Secondary | ICD-10-CM | POA: Diagnosis present

## 2019-05-11 DIAGNOSIS — I4892 Unspecified atrial flutter: Secondary | ICD-10-CM | POA: Diagnosis present

## 2019-05-11 DIAGNOSIS — J9811 Atelectasis: Secondary | ICD-10-CM

## 2019-05-11 DIAGNOSIS — I4510 Unspecified right bundle-branch block: Secondary | ICD-10-CM | POA: Diagnosis not present

## 2019-05-11 DIAGNOSIS — D689 Coagulation defect, unspecified: Secondary | ICD-10-CM | POA: Diagnosis not present

## 2019-05-11 DIAGNOSIS — N179 Acute kidney failure, unspecified: Secondary | ICD-10-CM | POA: Diagnosis present

## 2019-05-11 DIAGNOSIS — R570 Cardiogenic shock: Secondary | ICD-10-CM

## 2019-05-11 DIAGNOSIS — E1122 Type 2 diabetes mellitus with diabetic chronic kidney disease: Secondary | ICD-10-CM | POA: Diagnosis present

## 2019-05-11 DIAGNOSIS — I5023 Acute on chronic systolic (congestive) heart failure: Secondary | ICD-10-CM | POA: Diagnosis not present

## 2019-05-11 DIAGNOSIS — K5900 Constipation, unspecified: Secondary | ICD-10-CM | POA: Diagnosis not present

## 2019-05-11 DIAGNOSIS — I4891 Unspecified atrial fibrillation: Secondary | ICD-10-CM | POA: Diagnosis present

## 2019-05-11 DIAGNOSIS — M109 Gout, unspecified: Secondary | ICD-10-CM | POA: Diagnosis present

## 2019-05-11 DIAGNOSIS — Z953 Presence of xenogenic heart valve: Secondary | ICD-10-CM

## 2019-05-11 DIAGNOSIS — J189 Pneumonia, unspecified organism: Secondary | ICD-10-CM

## 2019-05-11 DIAGNOSIS — Z01818 Encounter for other preprocedural examination: Secondary | ICD-10-CM

## 2019-05-11 DIAGNOSIS — Z6835 Body mass index (BMI) 35.0-35.9, adult: Secondary | ICD-10-CM

## 2019-05-11 DIAGNOSIS — D62 Acute posthemorrhagic anemia: Secondary | ICD-10-CM | POA: Diagnosis not present

## 2019-05-11 DIAGNOSIS — K625 Hemorrhage of anus and rectum: Secondary | ICD-10-CM | POA: Diagnosis not present

## 2019-05-11 DIAGNOSIS — I420 Dilated cardiomyopathy: Secondary | ICD-10-CM | POA: Diagnosis present

## 2019-05-11 HISTORY — DX: Acute respiratory failure, unspecified whether with hypoxia or hypercapnia: J96.00

## 2019-05-11 LAB — CBC WITH DIFFERENTIAL/PLATELET
Abs Immature Granulocytes: 0.07 10*3/uL (ref 0.00–0.07)
Basophils Absolute: 0.1 10*3/uL (ref 0.0–0.1)
Basophils Relative: 0 %
Eosinophils Absolute: 0 10*3/uL (ref 0.0–0.5)
Eosinophils Relative: 0 %
HCT: 55.8 % — ABNORMAL HIGH (ref 39.0–52.0)
Hemoglobin: 17.3 g/dL — ABNORMAL HIGH (ref 13.0–17.0)
Immature Granulocytes: 1 %
Lymphocytes Relative: 15 %
Lymphs Abs: 2.2 10*3/uL (ref 0.7–4.0)
MCH: 26.9 pg (ref 26.0–34.0)
MCHC: 31 g/dL (ref 30.0–36.0)
MCV: 86.9 fL (ref 80.0–100.0)
Monocytes Absolute: 0.4 10*3/uL (ref 0.1–1.0)
Monocytes Relative: 3 %
Neutro Abs: 11.7 10*3/uL — ABNORMAL HIGH (ref 1.7–7.7)
Neutrophils Relative %: 81 %
Platelets: 253 10*3/uL (ref 150–400)
RBC: 6.42 MIL/uL — ABNORMAL HIGH (ref 4.22–5.81)
RDW: 15.7 % — ABNORMAL HIGH (ref 11.5–15.5)
WBC: 14.4 10*3/uL — ABNORMAL HIGH (ref 4.0–10.5)
nRBC: 0 % (ref 0.0–0.2)

## 2019-05-11 LAB — POCT I-STAT 7, (LYTES, BLD GAS, ICA,H+H)
Acid-base deficit: 5 mmol/L — ABNORMAL HIGH (ref 0.0–2.0)
Bicarbonate: 23.1 mmol/L (ref 20.0–28.0)
Calcium, Ion: 1.22 mmol/L (ref 1.15–1.40)
HCT: 53 % — ABNORMAL HIGH (ref 39.0–52.0)
Hemoglobin: 18 g/dL — ABNORMAL HIGH (ref 13.0–17.0)
O2 Saturation: 91 %
Potassium: 4.3 mmol/L (ref 3.5–5.1)
Sodium: 139 mmol/L (ref 135–145)
TCO2: 25 mmol/L (ref 22–32)
pCO2 arterial: 54.6 mmHg — ABNORMAL HIGH (ref 32.0–48.0)
pH, Arterial: 7.235 — ABNORMAL LOW (ref 7.350–7.450)
pO2, Arterial: 72 mmHg — ABNORMAL LOW (ref 83.0–108.0)

## 2019-05-11 LAB — RESPIRATORY PANEL BY PCR

## 2019-05-11 LAB — PROTIME-INR
INR: 1.4 — ABNORMAL HIGH (ref 0.8–1.2)
Prothrombin Time: 17.4 s — ABNORMAL HIGH (ref 11.4–15.2)

## 2019-05-11 LAB — COMPREHENSIVE METABOLIC PANEL
ALT: 23 U/L (ref 0–44)
AST: 26 U/L (ref 15–41)
Albumin: 4.1 g/dL (ref 3.5–5.0)
Alkaline Phosphatase: 105 U/L (ref 38–126)
Anion gap: 19 — ABNORMAL HIGH (ref 5–15)
BUN: 10 mg/dL (ref 6–20)
CO2: 22 mmol/L (ref 22–32)
Calcium: 9.6 mg/dL (ref 8.9–10.3)
Chloride: 101 mmol/L (ref 98–111)
Creatinine, Ser: 1.71 mg/dL — ABNORMAL HIGH (ref 0.61–1.24)
GFR calc Af Amer: 50 mL/min — ABNORMAL LOW (ref >60)
GFR calc non Af Amer: 43 mL/min — ABNORMAL LOW (ref >60)
Glucose, Bld: 272 mg/dL — ABNORMAL HIGH (ref 70–99)
Potassium: 3.9 mmol/L (ref 3.5–5.1)
Sodium: 142 mmol/L (ref 135–145)
Total Bilirubin: 1.7 mg/dL — ABNORMAL HIGH (ref 0.3–1.2)
Total Protein: 8.4 g/dL — ABNORMAL HIGH (ref 6.5–8.1)

## 2019-05-11 LAB — ECHOCARDIOGRAM COMPLETE
Height: 71 in
Weight: 4056.46 [oz_av]

## 2019-05-11 LAB — COOXEMETRY PANEL
Carboxyhemoglobin: 1.1 % (ref 0.5–1.5)
Methemoglobin: 1.5 % (ref 0.0–1.5)
O2 Saturation: 82.9 %
Total hemoglobin: 15.8 g/dL (ref 12.0–16.0)

## 2019-05-11 LAB — LACTIC ACID, PLASMA: Lactic Acid, Venous: 2.6 mmol/L (ref 0.5–1.9)

## 2019-05-11 LAB — CBG MONITORING, ED: Glucose-Capillary: 286 mg/dL — ABNORMAL HIGH (ref 70–99)

## 2019-05-11 LAB — D-DIMER, QUANTITATIVE: D-Dimer, Quant: >20 ug{FEU}/mL — ABNORMAL HIGH (ref 0.00–0.50)

## 2019-05-11 LAB — GLUCOSE, CAPILLARY
Glucose-Capillary: 104 mg/dL — ABNORMAL HIGH (ref 70–99)
Glucose-Capillary: 104 mg/dL — ABNORMAL HIGH (ref 70–99)
Glucose-Capillary: 167 mg/dL — ABNORMAL HIGH (ref 70–99)
Glucose-Capillary: 85 mg/dL (ref 70–99)

## 2019-05-11 LAB — MRSA PCR SCREENING: MRSA by PCR: POSITIVE — AB

## 2019-05-11 LAB — APTT: aPTT: 30 s (ref 24–36)

## 2019-05-11 LAB — TROPONIN I: Troponin I: 0.14 ng/mL (ref <0.03)

## 2019-05-11 LAB — BRAIN NATRIURETIC PEPTIDE: B Natriuretic Peptide: 1980.2 pg/mL — ABNORMAL HIGH (ref 0.0–100.0)

## 2019-05-11 LAB — SARS CORONAVIRUS 2 BY RT PCR (HOSPITAL ORDER, PERFORMED IN ~~LOC~~ HOSPITAL LAB): SARS Coronavirus 2: NEGATIVE

## 2019-05-11 LAB — PROCALCITONIN: Procalcitonin: 4.54 ng/mL

## 2019-05-11 MED ORDER — MIDAZOLAM HCL 2 MG/2ML IJ SOLN
2.0000 mg | INTRAMUSCULAR | Status: DC | PRN
  Filled 2019-05-11: qty 2

## 2019-05-11 MED ORDER — ROCURONIUM BROMIDE 50 MG/5ML IV SOLN
INTRAVENOUS | Status: AC | PRN
  Administered 2019-05-11: 100 mg via INTRAVENOUS

## 2019-05-11 MED ORDER — CHLORHEXIDINE GLUCONATE 0.12% ORAL RINSE (MEDLINE KIT)
15.0000 mL | Freq: Two times a day (BID) | OROMUCOSAL | Status: DC
  Administered 2019-05-11 – 2019-05-14 (×6): 15 mL via OROMUCOSAL

## 2019-05-11 MED ORDER — MIDAZOLAM HCL 2 MG/2ML IJ SOLN
2.0000 mg | INTRAMUSCULAR | Status: DC | PRN
  Administered 2019-05-11 (×2): 2 mg via INTRAVENOUS
  Filled 2019-05-11 (×3): qty 2

## 2019-05-11 MED ORDER — HYDRALAZINE HCL 20 MG/ML IJ SOLN: 10.0000 mg | INTRAMUSCULAR | Status: DC | PRN

## 2019-05-11 MED ORDER — ORAL CARE MOUTH RINSE
15.0000 mL | OROMUCOSAL | Status: DC
  Administered 2019-05-11 – 2019-05-14 (×25): 15 mL via OROMUCOSAL

## 2019-05-11 MED ORDER — CHLORHEXIDINE GLUCONATE CLOTH 2 % EX PADS
6.0000 | MEDICATED_PAD | Freq: Every day | CUTANEOUS | Status: AC
  Administered 2019-05-12 – 2019-05-16 (×5): 6 via TOPICAL

## 2019-05-11 MED ORDER — SODIUM CHLORIDE 0.9 % IV SOLN
500.0000 mg | INTRAVENOUS | Status: DC
  Administered 2019-05-11 – 2019-05-14 (×4): 500 mg via INTRAVENOUS
  Filled 2019-05-11 (×5): qty 500

## 2019-05-11 MED ORDER — HEPARIN BOLUS VIA INFUSION
5000.0000 [IU] | Freq: Once | INTRAVENOUS | Status: AC
  Administered 2019-05-11: 5000 [IU] via INTRAVENOUS
  Filled 2019-05-11: qty 5000

## 2019-05-11 MED ORDER — FENTANYL CITRATE (PF) 100 MCG/2ML IJ SOLN
100.0000 ug | INTRAMUSCULAR | Status: AC | PRN
  Administered 2019-05-11 (×3): 100 ug via INTRAVENOUS
  Filled 2019-05-11 (×5): qty 2

## 2019-05-11 MED ORDER — FENTANYL CITRATE (PF) 100 MCG/2ML IJ SOLN
100.0000 ug | INTRAMUSCULAR | Status: DC | PRN
  Administered 2019-05-13 – 2019-05-14 (×3): 100 ug via INTRAVENOUS
  Filled 2019-05-11 (×3): qty 2

## 2019-05-11 MED ORDER — ACETAMINOPHEN 325 MG PO TABS: 650.0000 mg | ORAL_TABLET | ORAL | Status: DC | PRN

## 2019-05-11 MED ORDER — MUPIROCIN 2 % EX OINT
1.0000 "application " | TOPICAL_OINTMENT | Freq: Two times a day (BID) | CUTANEOUS | Status: AC
  Administered 2019-05-11 – 2019-05-15 (×10): 1 via NASAL
  Filled 2019-05-11 (×3): qty 22

## 2019-05-11 MED ORDER — ASPIRIN 325 MG PO TABS
325.0000 mg | ORAL_TABLET | Freq: Every day | ORAL | Status: DC
  Administered 2019-05-11 – 2019-05-13 (×3): 325 mg
  Filled 2019-05-11 (×3): qty 1

## 2019-05-11 MED ORDER — HYDRALAZINE HCL 20 MG/ML IJ SOLN
10.0000 mg | Freq: Once | INTRAMUSCULAR | Status: AC
  Administered 2019-05-11: 10 mg via INTRAVENOUS
  Filled 2019-05-11: qty 1

## 2019-05-11 MED ORDER — INSULIN ASPART 100 UNIT/ML ~~LOC~~ SOLN
0.0000 [IU] | SUBCUTANEOUS | Status: DC
  Administered 2019-05-11: 4 [IU] via SUBCUTANEOUS
  Administered 2019-05-12: 3 [IU] via SUBCUTANEOUS
  Administered 2019-05-13: 09:00:00 4 [IU] via SUBCUTANEOUS

## 2019-05-11 MED ORDER — ALBUTEROL SULFATE (2.5 MG/3ML) 0.083% IN NEBU
2.5000 mg | INHALATION_SOLUTION | RESPIRATORY_TRACT | Status: DC | PRN
  Administered 2019-05-12 – 2019-05-13 (×2): 2.5 mg via RESPIRATORY_TRACT
  Filled 2019-05-11 (×2): qty 3

## 2019-05-11 MED ORDER — ONDANSETRON HCL 4 MG/2ML IJ SOLN: 4.0000 mg | Freq: Four times a day (QID) | INTRAMUSCULAR | Status: DC | PRN

## 2019-05-11 MED ORDER — CHLORHEXIDINE GLUCONATE CLOTH 2 % EX PADS
6.0000 | MEDICATED_PAD | Freq: Every day | CUTANEOUS | Status: DC
  Administered 2019-05-11 – 2019-05-12 (×2): 6 via TOPICAL

## 2019-05-11 MED ORDER — SODIUM CHLORIDE 0.9% FLUSH
10.0000 mL | INTRAVENOUS | Status: DC | PRN
  Administered 2019-05-21: 10 mL
  Filled 2019-05-11: qty 40

## 2019-05-11 MED ORDER — DOBUTAMINE IN D5W 4-5 MG/ML-% IV SOLN
5.0000 ug/kg/min | INTRAVENOUS | Status: DC
  Administered 2019-05-11 – 2019-05-12 (×2): 5 ug/kg/min via INTRAVENOUS
  Filled 2019-05-11 (×2): qty 250

## 2019-05-11 MED ORDER — HEPARIN (PORCINE) 25000 UT/250ML-% IV SOLN
2100.0000 [IU]/h | INTRAVENOUS | Status: AC
  Administered 2019-05-11 – 2019-05-15 (×6): 1500 [IU]/h via INTRAVENOUS
  Administered 2019-05-15: 1700 [IU]/h via INTRAVENOUS
  Administered 2019-05-16 – 2019-05-18 (×6): 2100 [IU]/h via INTRAVENOUS
  Filled 2019-05-11 (×13): qty 250

## 2019-05-11 MED ORDER — SODIUM CHLORIDE 0.9 % IV SOLN
2.0000 g | INTRAVENOUS | Status: DC
  Administered 2019-05-11 – 2019-05-14 (×4): 2 g via INTRAVENOUS
  Filled 2019-05-11 (×5): qty 20

## 2019-05-11 MED ORDER — ETOMIDATE 2 MG/ML IV SOLN
INTRAVENOUS | Status: AC | PRN
  Administered 2019-05-11: 100 mg via INTRAVENOUS

## 2019-05-11 MED ORDER — PERFLUTREN LIPID MICROSPHERE
1.0000 mL | INTRAVENOUS | Status: AC | PRN
  Administered 2019-05-11: 1 mL via INTRAVENOUS
  Filled 2019-05-11: qty 10

## 2019-05-11 MED ORDER — PANTOPRAZOLE SODIUM 40 MG IV SOLR
40.0000 mg | Freq: Every day | INTRAVENOUS | Status: DC
  Administered 2019-05-11: 40 mg via INTRAVENOUS
  Filled 2019-05-11: qty 40

## 2019-05-11 MED ORDER — PERFLUTREN LIPID MICROSPHERE
INTRAVENOUS | Status: AC
  Administered 2019-05-11: 14:00:00
  Filled 2019-05-11: qty 10

## 2019-05-11 MED ORDER — NITROGLYCERIN IN D5W 200-5 MCG/ML-% IV SOLN
0.0000 ug/min | INTRAVENOUS | Status: DC
  Filled 2019-05-11: qty 250

## 2019-05-11 MED ORDER — FUROSEMIDE 10 MG/ML IJ SOLN
40.0000 mg | Freq: Two times a day (BID) | INTRAMUSCULAR | Status: DC
  Administered 2019-05-11 – 2019-05-13 (×6): 40 mg via INTRAVENOUS
  Filled 2019-05-11 (×6): qty 4

## 2019-05-11 NOTE — ED Notes (Signed)
Critical care team at bedside

## 2019-05-11 NOTE — Progress Notes (Signed)
Changed rate on ventilator to 26 per MD.

## 2019-05-11 NOTE — Progress Notes (Signed)
VASCULAR LAB PRELIMINARY  PRELIMINARY  PRELIMINARY  PRELIMINARY  Bilateral lower extremity venous duplex completed.    Preliminary report:  See CV proc for preliminary results.  Loyal Holzheimer, RVT 05/11/2019, 4:03 PM

## 2019-05-11 NOTE — Progress Notes (Signed)
Patient transported from ED to room 3M06, while on ventilator, without complications.

## 2019-05-11 NOTE — H&P (Addendum)
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 Symptoms began worsening 7 days ago. He denied associated cough but endorsed intermittent fever. Denied chest pain, endorsed worsening BLE edema. Upon EMS arrival, EMS notes mild symptoms however when the patient began walking, DOE worsened requiring NRB. EMS noted elevated blood pressure and administered 1  Nitro and gave ASA. In ED patient placed on closed circuit BiPAP but denied improvement and was subsequently intubated.   SARS COV-2 negative, labs and CXR still pending.   Past Medical History  sCHF HTN A flutter CKD Bicuspid aortic valve s/p AVR  Significant Hospital Events   5/24 intubated in ED, admit to ICU   Consults:  PCCM  Procedures:  5/24 ETT >>>   Significant Diagnostic Tests:  CXR 5/24 >>>   Micro Data:  5/24 Sars CoV -2 > negative   Antimicrobials:  Azithromycin 5/24>>  Ceftriaxone  5/24 >>   Interim history/subjective:  Intubated in ED  Objective   Blood pressure (!) 188/136, pulse 97, temperature 98.6 F (37 C), temperature source Oral, resp. rate (!) 25, height 5\' 11"  (1.803 m), weight 115 kg, SpO2 90 %.    Vent Mode: PRVC FiO2 (%):  [93 %-100 %] 93 % Set Rate:  [10 bmp-12 bmp] 12 bmp Vt Set:  [500 mL] 500 mL PEEP:  [18 cmH20] 18 cmH20 Plateau Pressure:  [34 cmH20] 34 cmH20  No intake or output data in the 24 hours ending 05/11/19 0945 Filed Weights   05/11/19 0838  Weight: 115 kg    Examination: General: Obese adult made intubated sedated NAD HENT: NCAT pink mmm  trachea midline ETT OGT secure Lungs: Rhonchi throughout. Symmetrical chest excursion. Mechanically ventilated  Cardiovascular: RRR distant heart sounds. No JVD 1+ pulses Abdomen: Obese, soft, round, normoactive bowel sounds Extremities: BLE pitting edema. No clubbing, no cyanosis.  Neuro: Sedated, does not follow commands. Pupils sluggish  GU: due to void   Resolved Hospital Problem list     Assessment & Plan:   Acute Respiratory failure requiring intubation -Likely CHF exacerbation with flash pulmonary edema vs PNA. -SARS CoV 2 negative P Continue mechanical ventilation Awaiting CXR post intubation Awaiting ABG post intubation Pulm hygiene Anticipate need for ongoing diuresis  RASS goal 0 to -1 PRN fent PRN versed Azithromycin and Ceftriaxone for empiric coverage Send tracheal aspirate  Check RVP PCT AM CXR   CHFrEF (15%)  HTN Atrial Flutter Aortic Valve Disease P Continue Tele ECHO EKGTrend trops Pending results, may need cardiology consult Lasix 40 BID  PRN Hydral  Continue ASA 325 qD  Follow up BNP   CKD stage 3 P Follow BMP Treat electrolyte abnormalities PRN Renal dose medication Strict I/O  DM2 non compliant at home P SSI  Best practice:  Diet: NPO  Pain/Anxiety/Delirium protocol (if indicated): PRN fentanyl, PRN versed  VAP protocol (if indicated): yes DVT prophylaxis: SCD GI prophylaxis: protonix Glucose control: SSI  Mobility: bedrest Code Status: Full  Family Communication: pending  Disposition: admit to ICU   Labs  CBC: Recent Labs  Lab 05/11/19 0843  WBC 14.4*  NEUTROABS 11.7*  HGB 17.3*  HCT 55.8*  MCV 86.9  PLT 824    Basic Metabolic Panel: No results for input(s): NA, K, CL, CO2, GLUCOSE, BUN, CREATININE, CALCIUM, MG, PHOS in the last 168 hours. GFR: CrCl cannot be calculated (Patient's most recent lab result is older than the maximum 21 days allowed.). Recent Labs  Lab 05/11/19 0843  WBC 14.4*    Liver  Function Tests: No results for input(s): AST, ALT, ALKPHOS, BILITOT, PROT, ALBUMIN in the last 168 hours. No results for input(s): LIPASE, AMYLASE in the last 168 hours. No results for input(s): AMMONIA in the last 168 hours.  ABG    Component Value Date/Time   PHART 7.463 (H) 11/30/2018 0435   PCO2ART 33.4 11/30/2018 0435   PO2ART 98.9 11/30/2018 0435   HCO3 23.3 11/30/2018 0435   TCO2 24 11/29/2018 1313   ACIDBASEDEF 3.0 (H) 11/29/2018 1313   O2SAT 64.8 11/30/2018 1538     Coagulation Profile: No results for input(s): INR, PROTIME in the last 168 hours.  Cardiac Enzymes: No results for input(s): CKTOTAL, CKMB, CKMBINDEX, TROPONINI in the last 168 hours.  HbA1C: Hgb A1c MFr Bld  Date/Time Value Ref Range Status  12/06/2018 03:26 PM 6.6 (H) 4.8 - 5.6 % Final    Comment:    (NOTE) Pre diabetes:          5.7%-6.4% Diabetes:              >6.4% Glycemic control for   <7.0% adults with diabetes   03/29/2015 05:19 AM 6.9 (H) 4.8 - 5.6 % Final    Comment:    (NOTE)         Pre-diabetes: 5.7 - 6.4         Diabetes: >6.4         Glycemic control for adults with diabetes: <7.0     CBG: Recent Labs  Lab 05/11/19 0933  GLUCAP 286*    Review of Systems:   Per HPI  Past Medical History  He,  has a past medical history of Aortic valve disease, Atrial flutter ([place]), Benign hypertensive heart and renal disease, Chronic combined systolic and diastolic CHF (congestive heart failure) ([place]), Diabetes mellitus ([place]), Essential hypertension (11/19/2014), Insomnia, Murmur, Noncompliance, Obesity, and Pulmonary hypertension ([place]).   Surgical History    Past Surgical History:  Procedure Laterality Date  . AORTIC VALVE REPLACEMENT N/A 04/05/2015   Procedure: AORTIC VALVE REPLACEMENT (AVR) using a 63mm Edwards Aortic Magna Ease Valve ;  Surgeon: [name], MD;  Location: [place];  Service: Open Heart Surgery;  Laterality: N/A;  . LEFT AND RIGHT HEART CATHETERIZATION WITH CORONARY  ANGIOGRAM N/A 11/27/2014   Procedure: LEFT AND RIGHT HEART CATHETERIZATION WITH CORONARY ANGIOGRAM;  Surgeon: [name], MD;  Location: [facility] C[facility] LAB;  Service: Cardiovascular;  Laterality: N/A;  . MAZE N/A 04/05/2015   Procedure: MAZE;  Surgeon: [name], MD;  Location: [place];  Service: Open Heart Surgery;  Laterality: N/A;  . REPLACEMENT ASCENDING AORTA N/A 04/05/2015   Procedure: REPLACEMENT ASCENDING AORTA with a 35mm Hemashield Platinum Graft;  Surgeon: [name], MD;  Location: [place];  Service: Open Heart Surgery;  Laterality: N/A;  . TEE WITHOUT CARDIOVERSION N/A 04/05/2015   Procedure: TRANSESOPHAGEAL ECHOCARDIOGRAM (TEE);  Surgeon: [name], MD;  Location: [place];  Service: Open Heart Surgery;  Laterality: N/A;     Social History  reports that he has never smoked. He has never used smokeless tobacco. He reports that he does not drink alcohol or use drugs.   Family History   His family history is negative for Heart attack.   Allergies No Known Allergies   Home Medications  Prior to Admission medications   Medication Sig Start Date End Date Taking? Authorizing Provider  aspirin EC 81 MG tablet Take 81 mg by mouth daily.    [provider]  furosemide (LASIX) 40 MG tablet Take 40 mg by mouth 2 (two) times daily.    [provider]  hydrALAZINE (APRESOLINE) 25 MG tablet Take 25 mg by mouth 2 (two) times daily. 12/18/18   [provider]  isosorbide mononitrate (ISMO,MONOKET) 10 MG tablet Take 10 mg by mouth daily.    [provider]     Critical care time: 19 minutes     Eliseo Gum MSN, AGACNP-BC [place] [phone] If no answer, [phone] 05/11/2019, 10:28 AM

## 2019-05-11 NOTE — ED Notes (Signed)
Patient's brother Nat Math [phone] called to give update. No further questions at this time

## 2019-05-11 NOTE — Progress Notes (Addendum)
Cardiology Consultation:   Patient ID: LAVELL SUPPLE MRN: [id number]; DOB: 1961/06/03  Admit date: 05/11/2019 Date of Consult: 05/11/2019  Primary Care Provider: [name], MD Primary Cardiologist: [name], MD  Primary Electrophysiologist:  None    Patient Profile:   [name] is a 58 y.o. male with a hx of CHF, AVR who is being seen today for the evaluation of Acute respiratory failure  at the request of Dr. Vaughan Browner.  History of Present Illness:   [name] is a 58 year old man with a longstanding history of chronic combined systolic and diastolic heart failure, nonischemic cardiomyopathy, aortic valve replacement with a biological prosthesis (Edwards 27 mm) and repair of the ascending aorta (27 mm Hemashield graft in 2016, persistent atrial flutter, pulmonary artery hypertension, morbid obesity, type 2 diabetes mellitus, pulmonary artery hypertension, essential hypertension, CKD stage III.  The patient is intubated, mechanically ventilated and obtunded, the history is obtained from the chart.  He presented earlier this morning with a respiratory difficulty and had rapidly progressive symptoms, leading to intubation around noon today.  He has a low-grade fever of 100.2 F and has been hypotensive.  He was initially quite tachycardic, but his heart rate is now in the 60s.  He received 1 dose of fentanyl around noon time and is still very hard to awake.    His d-dimer is elevated.  Due to concerns regarding pulmonary embolism, a CT angiogram was considered, but canceled due to kidney abnormalities.  A lower extremity Doppler ultrasound has been ordered for possible DVT.  Echo shows severely depressed left ventricular systolic function with a dilated left ventricle and ejection fraction of 20%.  He has a small left ventricular apical thrombus that does not appear to be mobile.  Initial presentation with heart failure was in 2015 in [place] when his EF was 20% was  found to have a bicuspid aortic valve with severe AI and AS as well as a dilated ascending aorta.  He eventually underwent surgical repair in 2016 in [place].  In May 2016 echo showed LVEF 50-55%, not long after his surgery.  After hospital discharge she did not return for any cardiology follow-up.  He was hospitalized in December 2019 with respiratory failure requiring intubation for hypoxia in the setting of a staphylococcal community-acquired pneumonia, complicated by acute kidney injury and heart failure exacerbation.  During that time he had episodes of junctional bradycardia.  During subsequent visits he was found to have very long first-degree AV block, right bundle branch block.    On November 29, 2018 his echocardiogram was estimated showing a left ventricular ejection fraction of 50-55% with grade 1 diastolic dysfunction and normal function of the aortic valve prosthesis, but with enlarged right heart chambers and a estimated systolic pulmonary pressure 56 mmHg.  There have been repeated instances of evidence of noncompliance with follow-up, with medications and with sodium restriction.  Work-up is pending for obstructive sleep apnea.  Note that he was notified of some behavioral issues at his office appointment on January 21.  He did not take this well.  He reported that he wished to cancel the scheduled follow-up appointment for March 31 and that he would not return to our office, but would find care elsewhere.  To my knowledge he has not established cardiology follow-up at another location and we will continue to provide cardiology services as long as he requires them from [place].  Past Medical History:  Diagnosis Date   Aortic valve disease  a. severe AI/severe AS/bicuspid AV s/p bioprosthetic aortic valve with replacement of ascending aorta 2016   Atrial flutter (HCC)    Benign hypertensive heart and renal disease    Chronic combined systolic and diastolic CHF (congestive  heart failure) ([place])    Diabetes mellitus ([place])    Essential hypertension 11/19/2014   Insomnia    Murmur    Noncompliance    Obesity    Pulmonary hypertension ([place])     Past Surgical History:  Procedure Laterality Date   AORTIC VALVE REPLACEMENT N/A 04/05/2015   Procedure: AORTIC VALVE REPLACEMENT (AVR) using a 68mm Edwards Aortic Magna Ease Valve ;  Surgeon: [name], MD;  Location: [facility];  Service: Open Heart Surgery;  Laterality: N/A;   LEFT AND RIGHT HEART CATHETERIZATION WITH CORONARY ANGIOGRAM N/A 11/27/2014   Procedure: LEFT AND RIGHT HEART CATHETERIZATION WITH CORONARY ANGIOGRAM;  Surgeon: [name], MD;  Location: [facility] CATH LAB;  Service: Cardiovascular;  Laterality: N/A;   MAZE N/A 04/05/2015   Procedure: MAZE;  Surgeon: [name], MD;  Location: [facility];  Service: Open Heart Surgery;  Laterality: N/A;   REPLACEMENT ASCENDING AORTA N/A 04/05/2015   Procedure: REPLACEMENT ASCENDING AORTA with a 56mm Hemashield Platinum Graft;  Surgeon: [name], MD;  Location: [facility];  Service: Open Heart Surgery;  Laterality: N/A;   TEE WITHOUT CARDIOVERSION N/A 04/05/2015   Procedure: TRANSESOPHAGEAL ECHOCARDIOGRAM (TEE);  Surgeon: [name], MD;  Location: [facility];  Service: Open Heart Surgery;  Laterality: N/A;     Home Medications:  Prior to Admission medications   Medication Sig Start Date End Date Taking? Authorizing Provider  carvedilol (COREG) 3.125 MG tablet Take 3.125 mg by mouth 2 (two) times daily. 02/10/19  Yes [provider]  aspirin EC 81 MG tablet Take 81 mg by mouth daily.    [provider]  furosemide (LASIX) 40 MG tablet Take 40 mg by mouth 2 (two) times daily.    [provider]  hydrALAZINE (APRESOLINE) 25 MG tablet Take 25 mg by mouth 2 (two) times daily. 12/18/18   [provider]  isosorbide mononitrate (ISMO,MONOKET) 10 MG tablet Take 10 mg by mouth daily.    [provider]    Inpatient  Medications: Scheduled Meds:  aspirin  325 mg Per Tube Daily   Chlorhexidine Gluconate Cloth  6 each Topical Daily   [START ON 05/12/2019] Chlorhexidine Gluconate Cloth  6 each Topical Q0600   furosemide  40 mg Intravenous Q12H   insulin aspart  0-20 Units Subcutaneous Q4H   mupirocin ointment  1 application Nasal BID   pantoprazole (PROTONIX) IV  40 mg Intravenous QHS   perflutren lipid microspheres (DEFINITY) IV suspension       Continuous Infusions:  azithromycin 500 mg (05/11/19 1513)   cefTRIAXone (ROCEPHIN)  IV 2 g (05/11/19 1251)   heparin 1,500 Units/hr (05/11/19 1523)   PRN Meds: acetaminophen, albuterol, fentaNYL (SUBLIMAZE) injection, hydrALAZINE, midazolam, midazolam, ondansetron (ZOFRAN) IV, perflutren lipid microspheres (DEFINITY) IV suspension, sodium chloride flush  Allergies:   No Known Allergies  Social History:   Social History   Socioeconomic History   Marital status: Single    Spouse name: Not on file   Number of children: Not on file   Years of education: Not on file   Highest education level: Not on file  Occupational History   Not on file  Social Needs   Financial resource strain: Not on file   Food  insecurity:    Worry: Not on file    Inability: Not on file   Transportation needs:    Medical: Not on file    Non-medical: Not on file  Tobacco Use   Smoking status: Never Smoker   Smokeless tobacco: Never Used  Substance and Sexual Activity   Alcohol use: Never    Alcohol/week: 0.0 standard drinks    Frequency: Never   Drug use: Never   Sexual activity: Not on file  Lifestyle   Physical activity:    Days per week: Not on file    Minutes per session: Not on file   Stress: Not on file  Relationships   Social connections:    Talks on phone: Not on file    Gets together: Not on file    Attends religious service: Not on file    Active member of club or organization: Not on file    Attends meetings of clubs or  organizations: Not on file    Relationship status: Not on file   Intimate partner violence:    Fear of current or ex partner: Not on file    Emotionally abused: Not on file    Physically abused: Not on file    Forced sexual activity: Not on file  Other Topics Concern   Not on file  Social History Narrative   Not on file    Family History:    Family History  Problem Relation Age of Onset   Heart attack Neg Hx      ROS:  Please see the history of present illness.  Unable to obtain review of systems due to mental status.  Physical Exam/Data:   Vitals:   05/11/19 1430 05/11/19 1445 05/11/19 1446 05/11/19 1530  BP: 104/79   (!) 78/63  Pulse: 73 75 83 67  Resp: (!) 26 (!) 26 (!) 26 (!) 26  Temp:      TempSrc:      SpO2: 99% 99% 99% 100%  Weight:      Height:       No intake or output data in the 24 hours ending 05/11/19 1555 Last 3 Weights 05/11/2019 01/07/2019 01/03/2019  Weight (lbs) 253 lb 8.5 oz 253 lb 1.9 oz 265 lb 12.8 oz  Weight (kg) 115 kg 114.814 kg 120.566 kg     Body mass index is 35.36 kg/m.  General: Morbidly obese, intubated/mechanically ventilated, responds to loud verbal stimulation by opening his eyes, but immediately falls asleep again. HEENT: normal Lymph: no adenopathy Neck: Unable to evaluate Endocrine:  No thryomegaly Vascular: No carotid bruits; FA pulses 2+ bilaterally without bruits, cool extremities. Cardiac:  normal S1, S2, S3 is present; RRR; apical impulse is laterally displaced Lungs:  clear to auscultation bilaterally, no wheezing, rhonchi or rales  Abd: Distended due to obesity, but soft, nontender, no hepatomegaly  Ext: no edema Musculoskeletal:  No deformities, BUE and BLE strength normal and equal Skin: warm and dry  Neuro:  CNs 2-12 intact, no focal abnormalities noted Psych:  Normal affect   EKG:  The EKG was personally reviewed and demonstrates: Marked sinus bradycardia and accelerated junctional rhythm with no evidence of AV  association.  Occasional PVCs are seen.  Old right bundle branch block and left anterior fascicular block is present.  Cannot rule out old inferior infarction.  Lateral T wave inversion is seen, unchanged from previous tracings.  The QRS morphology similar to tracings from December 2019 and January 2020, when the rhythm was clearly  supraventricular in origin. Telemetry:  Telemetry was personally reviewed and demonstrates: Accelerated junctional rhythm with occasional PVCs.  No convincing evidence of AV conduction.  Relevant CV Studies: Echocardiogram November 29, 2018  - Left ventricle: The cavity size was normal. Wall thickness was   increased in a pattern of severe LVH. Systolic function was   normal. The estimated ejection fraction was in the range of 50%   to 55%. Mild hypokinesis of the anteroseptal myocardium. Doppler   parameters are consistent with abnormal left ventricular   relaxation (grade 1 diastolic dysfunction). - Aortic valve: A prosthesis was present and functioning normally.   The prosthesis had a normal range of motion. The sewing ring   appeared normal, had no rocking motion, and showed no evidence of   dehiscence. There was mild regurgitation. Mean gradient (S): 10   mm Hg. Valve area (VTI): 1.69 cm^2. Valve area (Vmax): 1.63 cm^2.   Valve area (Vmean): 1.61 cm^2. - Left atrium: The atrium was severely dilated. - Right ventricle: The cavity size was mildly dilated. Wall   thickness was normal. Systolic function was moderately reduced. - Right atrium: The atrium was moderately to severely dilated. - Atrial septum: The septum bowed from right to left, consistent   with increased right atrial pressure. - Pulmonary arteries: Systolic pressure was moderately to severely   increased. PA peak pressure: 56 mm Hg (S).  Impressions:  - Severe LVH. Moderate to severe pulmonary hypertension. Aortic   valve stable with mild regurgitation.  Echocardiogram today 05/11/2019   1. Small, fixed thrombus on the apical wall of the left ventricle.  2. The left ventricle has severely reduced systolic function, with an ejection fraction of 20-25%. The cavity size was moderately dilated. There is moderate concentric left ventricular hypertrophy. Indeterminate diastolic filling due to E-A fusion. Left  ventricular diffuse hypokinesis.  3. The right ventricle has moderately reduced systolic function. The cavity was mildly enlarged. There is mildly increased right ventricular wall thickness. Right ventricular systolic pressure could not be assessed.  4. Left atrial size was severely dilated.  5. Right atrial size was severely dilated.  6. Trivial pericardial effusion is present.  7. The mitral valve is myxomatous. Mild thickening of the mitral valve leaflet. No evidence of mitral valve stenosis.  8. The tricuspid valve is grossly normal.  9. A 25mm an Edwards bioprosthesis valve is present in the aortic position. Procedure Date: 2016.  Laboratory Data:  Chemistry Recent Labs  Lab 05/11/19 0843 05/11/19 1008  NA 142 139  K 3.9 4.3  CL 101  --   CO2 22  --   GLUCOSE 272*  --   BUN 10  --   CREATININE 1.71*  --   CALCIUM 9.6  --   GFRNONAA 43*  --   GFRAA 50*  --   ANIONGAP 19*  --     Recent Labs  Lab 05/11/19 0843  PROT 8.4*  ALBUMIN 4.1  AST 26  ALT 23  ALKPHOS 105  BILITOT 1.7*   Hematology Recent Labs  Lab 05/11/19 0843 05/11/19 1008  WBC 14.4*  --   RBC 6.42*  --   HGB 17.3* 18.0*  HCT 55.8* 53.0*  MCV 86.9  --   MCH 26.9  --   MCHC 31.0  --   RDW 15.7*  --   PLT 253  --    Cardiac Enzymes Recent Labs  Lab 05/11/19 0843  TROPONINI 0.14*   No results for input(s): TROPIPOC in  the last 168 hours.  BNP Recent Labs  Lab 05/11/19 0844  BNP 1,980.2*    DDimer  Recent Labs  Lab 05/11/19 1217  DDIMER >20.00*    Radiology/Studies:  Dg Chest Portable 1 View  Result Date: 05/11/2019 CLINICAL DATA:  58 year old male with history of  respiratory failure. EXAM: PORTABLE CHEST 1 VIEW COMPARISON:  Chest CT 12/04/2018. FINDINGS: An endotracheal tube is in place with tip 6.3 cm above the carina. A nasogastric tube is seen extending into the stomach, however, the tip of the nasogastric tube extends below the lower margin of the image. Transcutaneous defibrillator pad projecting over the upper left hemithorax. Lung volumes are normal. Diffuse interstitial prominence and patchy airspace consolidation, most evident throughout the mid to lower lungs bilaterally, but asymmetrically distributed (right greater than left). No definite pleural effusions. Mild cardiomegaly. Upper mediastinal contours are distorted by patient positioning. Status post median sternotomy for aortic valve replacement. Aortic atherosclerosis. IMPRESSION: 1. Support apparatus and postoperative changes, as above. 2. Patchy multifocal asymmetrically distributed interstitial and airspace consolidation concerning for multilobar pneumonia. 3. Aortic atherosclerosis. Electronically Signed   By: Vinnie Langton M.D.   On: 05/11/2019 10:27    Assessment and Plan:   1. CMP/CHF: Echocardiograms from December 13 and today are compared side-by-side.  Although I believe that the estimated ejection fraction of 50-55% in December was overly optimistic, there has clearly been a drastic worsening of left ventricular systolic function.  The cause for this is unclear.  He had normal coronary arteries in 2016.  There is also evidence of severely diminished cardiac output.  This is further compounded by the loss of normal AV association.  Also has marked left ventricular hypertrophy with diastolic dysfunction.  We will try to get a mixed venous sat.  He has evidence for cardiogenic shock.  He is severely oliguric and is mildly hypotensive, will start inotropes.  We will try dobutamine initially.  If he develops hypotension can switch to norepinephrine.  We will involve the heart failure service in  the morning. 2. AVR: Normally functioning aortic valve bioprosthesis. 3. Accelerated junctional rhythm: This appears to be outpacing the sinus bradycardia, rather than AV block.  On previous tracings he had extremely long first-degree AV block and is reported to have had persistent atrial flutter in the past.  I expect that the junctional rhythm may accelerate further on dobutamine, but it is also possible that the sinus node may pick up and he may benefit from normal sinus rhythm with normal AV association. 4. PAH/RHF: He also has evidence of chronic systolic pulmonary hypertension and right heart failure as well as polycythemia, both likely related to obstructive sleep apnea that has not been treated. 5. LV apical thrombus: He is currently on intravenous heparin, started because of suspicion for DVT/PE.  Initial DVT screen is negative.  He should continue on intravenous heparin and subsequent full oral anticoagulation for the LV clot. 6. Morbid obesity  CRITICAL CARE Performed by: Dani Gobble Niralya Ohanian   Total critical care time: 50 minutes  Critical care time was exclusive of separately billable procedures and treating other patients.  Critical care was necessary to treat or prevent imminent or life-threatening deterioration.  Critical care was time spent personally by me on the following activities: development of treatment plan with patient and/or surrogate as well as nursing, discussions with consultants, evaluation of patient's response to treatment, examination of patient, obtaining history from patient or surrogate, ordering and performing treatments and interventions, ordering and review of  laboratory studies, ordering and review of radiographic studies, pulse oximetry and re-evaluation of patient's condition.      For questions or updates, please contact [place] Please consult [email or web address] for contact info under     Signed, Sanda Klein, MD  05/11/2019 3:55 PM

## 2019-05-11 NOTE — Code Documentation (Signed)
Intubated with 7.5 ETT, taped at 23 at lip

## 2019-05-11 NOTE — Progress Notes (Signed)
ANTICOAGULATION CONSULT NOTE - Initial Consult  Pharmacy Consult for heparin Indication: VTE  No Known Allergies  Patient Measurements: Height: 5\' 11"  (180.3 cm) Weight: 253 lb 8.5 oz (115 kg) IBW/kg (Calculated) : 75.3 Heparin Dosing Weight: 100kg  Vital Signs: Temp: 100.2 F (37.9 C) (05/24 1201) Temp Source: Oral (05/24 1201) BP: 104/79 (05/24 1430) Pulse Rate: 83 (05/24 1446)  Labs: Recent Labs    05/11/19 0843 05/11/19 1008 05/11/19 1217  HGB 17.3* 18.0*  --   HCT 55.8* 53.0*  --   PLT 253  --   --   APTT  --   --  30  LABPROT  --   --  17.4*  INR  --   --  1.4*  CREATININE 1.71*  --   --   TROPONINI 0.14*  --   --     Estimated Creatinine Clearance: 60.7 mL/min (A) (by C-G formula based on SCr of 1.71 mg/dL (H)).   Medical History: Past Medical History:  Diagnosis Date  . Aortic valve disease    a. 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 Atrial flutter ([place])   . Benign hypertensive heart and renal disease   . Chronic combined systolic and diastolic CHF (congestive heart failure) ([place])   . Diabetes mellitus ([place])   . Essential hypertension 11/19/2014  . Insomnia   . Murmur   . Noncompliance   . Obesity   . Pulmonary hypertension (HCC)      Assessment: 16 yoM admitted with SOB requiring intubation. Labs reveal slightly elevated troponins and D-dimer >20. CTA unable to be obtained due to Cr elevation. Starting heparin empirically to rule out VTE for now. Baseline INR and aPTT normal, CBC wnl, no anticoagulation PTA.   Goal of Therapy:  Heparin level 0.3-0.7 units/ml Monitor platelets by anticoagulation protocol: Yes   Plan:  -Heparin 5000 units x1 then 1400 units/hr -Check 6hr heparin level -Daily heparin level and CBC -F/U dopplers  Arrie Senate, PharmD, BCPS Clinical Pharmacist [phone] Please check AMION for all [place] numbers 05/11/2019

## 2019-05-11 NOTE — Progress Notes (Signed)
CRITICAL VALUE ALERT  Critical Value:  Lactic Acid 2.6  Date & Time Notied:  1:00 PM 05/11/19   Provider Notified: Eliseo Gum, NP  Orders Received/Actions taken: Continue to Monitor.

## 2019-05-11 NOTE — Progress Notes (Signed)
Pt was intubated by ER MD without complications. Unable to sustain pt's saturations and had to go up up 100% and 18 peep per MD.

## 2019-05-11 NOTE — ED Provider Notes (Addendum)
Emergency Department Provider Note   I have reviewed the triage vital signs and the nursing notes.   HISTORY  Chief Complaint Shortness of Breath   HPI [name] is a 58 y.o. male with PMH of systolic heart failure (EF 15%), Aortic stenosis s/p replacement, ascending thoracic aortic aneurysm, pulmonary HTN, obesity, and HTN presents to the emergency department for evaluation of shortness of breath.  Symptoms have been worsening over the past 7 days.  He denies any fevers but has had some cough.  He notes worsening swelling in the legs per EMS and reports that he has been compliant with his Lasix.  Denies chest pain.  EMS state that initially the patient appeared to have mild symptoms.  He was placed on oxygen at home and allowed to gather several things around his room which she was able to walk and do.  After walking, they report significantly worsening respiratory distress requiring nonrebreather.  He noted significantly elevated blood pressures.  They gave aspirin in route along with 1 nitroglycerin without significant change in symptoms.    Level 5 caveat: Respiratory Distress    Past Medical History:  Diagnosis Date  . Aortic valve disease    a. 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 Atrial flutter ([place])   . Benign hypertensive heart and renal disease   . Chronic combined systolic and diastolic CHF (congestive heart failure) ([place])   . Diabetes mellitus ([place])   . Essential hypertension 11/19/2014  . Insomnia   . Murmur   . Noncompliance   . Obesity   . Pulmonary hypertension [facility])     Patient Active Problem List   Diagnosis Date Noted  . Acute respiratory failure ([place]) 05/11/2019  . Hypoxia   . Respiratory failure ([place]) 11/29/2018  . Shock circulatory ([place])   . S/P AVR 04/05/2015  . Aortic stenosis 03/29/2015  . Bicuspid aortic valve 03/29/2015  . Elevated troponin 03/28/2015  . Acute on  chronic systolic heart failure ([place]) 03/27/2015  . Weakness generalized 11/27/2014  . Aortic insufficiency 11/19/2014  . Congestive dilated cardiomyopathy ([place]) 11/19/2014  . Essential hypertension 11/19/2014  . 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 Chronic systolic CHF (congestive heart failure) ([place]) 11/19/2014  . SOB (shortness of breath) 11/19/2014  . Atrial flutter ([place]) 11/19/2014    Past Surgical History:  Procedure Laterality Date  . AORTIC VALVE REPLACEMENT N/A 04/05/2015   Procedure: AORTIC VALVE REPLACEMENT (AVR) using a 36mm Edwards Aortic Magna Ease Valve ;  Surgeon: [name], MD;  Location: [place];  Service: Open Heart Surgery;  Laterality: N/A;  . LEFT AND RIGHT HEART CATHETERIZATION WITH CORONARY ANGIOGRAM N/A 11/27/2014   Procedure: LEFT AND RIGHT HEART CATHETERIZATION WITH CORONARY ANGIOGRAM;  Surgeon: [name], MD;  Location: [facility] CATH LAB;  Service: Cardiovascular;  Laterality: N/A;  . MAZE N/A 04/05/2015   Procedure: MAZE;  Surgeon: [name], MD;  Location: [place];  Service: Open Heart Surgery;  Laterality: N/A;  . REPLACEMENT ASCENDING AORTA N/A 04/05/2015   Procedure: REPLACEMENT ASCENDING AORTA with a 88mm Hemashield Platinum Graft;  Surgeon: [name], MD;  Location: [place];  Service: Open Heart Surgery;  Laterality: N/A;  . TEE WITHOUT CARDIOVERSION N/A 04/05/2015   Procedure: TRANSESOPHAGEAL ECHOCARDIOGRAM (TEE);  Surgeon: [name], MD;  Location: [place];  Service: Open Heart Surgery;  Laterality: N/A;    Allergies Patient has no known  allergies.  Family History  Problem Relation Age of Onset  . Heart attack Neg Hx     Social History Social History   Tobacco Use  . Smoking status: Never Smoker  . Smokeless tobacco: Never Used  Substance Use Topics  . Alcohol use: Never    Alcohol/week: 0.0 standard drinks    Frequency: Never  . Drug use: Never    Review of Systems  Constitutional: No fever/chills  Eyes: No visual changes. ENT: No sore throat. Cardiovascular: Denies chest pain. Respiratory: Positive shortness of breath and cough.  Gastrointestinal: No abdominal pain.  No nausea, no vomiting.  No diarrhea.  No constipation. Genitourinary: Negative for dysuria. Musculoskeletal: Negative for back pain. Skin: Negative for rash. Neurological: Negative for headaches, focal weakness or numbness.  10-point ROS otherwise negative.  ____________________________________________   PHYSICAL EXAM:  VITAL SIGNS: Vitals:   05/11/19 1114 05/11/19 1115  BP:  129/89  Pulse: (!) 101 (!) 114  Resp: (!) 26 (!) 26  Temp:    SpO2: 93% 91%    Constitutional: Alert with respiratory distress. Diaphoretic.  Eyes: Conjunctivae are normal.  Head: Atraumatic. Nose: No congestion/rhinnorhea. Mouth/Throat: Mucous membranes are moist.  Neck: No stridor.  Cardiovascular: Tachycardia. Good peripheral circulation. Grossly normal heart sounds.   Respiratory: Significantly increased respiratory effort.  No retractions. Lungs with rales bilaterally.  Gastrointestinal: Soft and nontender. No distention.  Musculoskeletal: No lower extremity tenderness with 2+ pitting edema. No gross deformities of extremities. Neurologic:  Normal speech and language.  Skin:  Skin is warm, dry and intact. No rash noted.  ____________________________________________   LABS (all labs ordered are listed, but only abnormal results are displayed)  Labs Reviewed  COMPREHENSIVE METABOLIC PANEL - Abnormal; Notable for the following components:      Result Value   Glucose, Bld 272 (*)    Creatinine, Ser 1.71 (*)    Total Protein 8.4 (*)    Total Bilirubin 1.7 (*)    GFR calc non Af Amer 43 (*)    GFR calc Af Amer 50 (*)    Anion gap 19 (*)    All other components within normal limits  TROPONIN I - Abnormal; Notable for the following components:   Troponin I 0.14 (*)    All other components within normal limits  BRAIN  NATRIURETIC PEPTIDE - Abnormal; Notable for the following components:   B Natriuretic Peptide 1,980.2 (*)    All other components within normal limits  CBC WITH DIFFERENTIAL/PLATELET - Abnormal; Notable for the following components:   WBC 14.4 (*)    RBC 6.42 (*)    Hemoglobin 17.3 (*)    HCT 55.8 (*)    RDW 15.7 (*)    Neutro Abs 11.7 (*)    All other components within normal limits  CBG MONITORING, ED - Abnormal; Notable for the following components:   Glucose-Capillary 286 (*)    All other components within normal limits  POCT I-STAT 7, (LYTES, BLD GAS, ICA,H+H) - Abnormal; Notable for the following components:   pH, Arterial 7.235 (*)    pCO2 arterial 54.6 (*)    pO2, Arterial 72.0 (*)    Acid-base deficit 5.0 (*)    HCT 53.0 (*)    Hemoglobin 18.0 (*)    All other components within normal limits  SARS CORONAVIRUS 2 (HOSPITAL ORDER, [place] [facility])  CULTURE, RESPIRATORY  RESPIRATORY PANEL BY PCR  CULTURE, BLOOD (ROUTINE X 2)  CULTURE, BLOOD (ROUTINE X 2)  BLOOD GAS, ARTERIAL  PROCALCITONIN  APTT  PROTIME-INR  D-DIMER, QUANTITATIVE (NOT AT [facility])  LACTIC ACID, PLASMA  LACTIC ACID, PLASMA   ____________________________________________  EKG   EKG Interpretation  Date/Time:  Sunday May 11 2019 10:55:35 EDT Ventricular Rate:  142 PR Interval:    QRS Duration: 176 QT Interval:  399 QTC Calculation: 614 R Axis:   -79 Text Interpretation:  Sinus tachycardia IVCD, consider atypical RBBB LVH with IVCD, LAD and secondary repol abnrm Prolonged QT interval Similar morphology to prior. No STEMI.  Confirmed by Nanda Quinton [id number]) on 05/11/2019 11:22:25 AM       ____________________________________________  RADIOLOGY  Dg Chest Portable 1 View  Result Date: 05/11/2019 CLINICAL DATA:  58 year old male with history of respiratory failure. EXAM: PORTABLE CHEST 1 VIEW COMPARISON:  Chest CT 12/04/2018. FINDINGS: An endotracheal tube is in place with tip  6.3 cm above the carina. A nasogastric tube is seen extending into the stomach, however, the tip of the nasogastric tube extends below the lower margin of the image. Transcutaneous defibrillator pad projecting over the upper left hemithorax. Lung volumes are normal. Diffuse interstitial prominence and patchy airspace consolidation, most evident throughout the mid to lower lungs bilaterally, but asymmetrically distributed (right greater than left). No definite pleural effusions. Mild cardiomegaly. Upper mediastinal contours are distorted by patient positioning. Status post median sternotomy for aortic valve replacement. Aortic atherosclerosis. IMPRESSION: 1. Support apparatus and postoperative changes, as above. 2. Patchy multifocal asymmetrically distributed interstitial and airspace consolidation concerning for multilobar pneumonia. 3. Aortic atherosclerosis. Electronically Signed   By: Vinnie Langton M.D.   On: 05/11/2019 10:27    ____________________________________________   PROCEDURES  Procedure(s) performed:   .Critical Care Performed by: Margette Fast, MD Authorized by: Margette Fast, MD   Critical care provider statement:    Critical care time (minutes):  50   Critical care time was exclusive of:  Teaching time and separately billable procedures and treating other patients   Critical care was necessary to treat or prevent imminent or life-threatening deterioration of the following conditions:  Respiratory failure   Critical care was time spent personally by me on the following activities:  Blood draw for specimens, development of treatment plan with patient or surrogate, discussions with consultants, evaluation of patient's response to treatment, examination of patient, obtaining history from patient or surrogate, ordering and performing treatments and interventions, ordering and review of laboratory studies, ordering and review of radiographic studies, pulse oximetry, re-evaluation of  patient's condition, review of old charts and ventilator management   I assumed direction of critical care for this patient from another provider in my specialty: no   Date/Time: 05/11/2019 10:12 AM Performed by: Margette Fast, MD Pre-anesthesia Checklist: Patient identified, Emergency Drugs available, Suction available and Patient being monitored Preoxygenation: 92% on BiPAP  Induction Type: Rapid sequence Laryngoscope Size: Glidescope and 4 Grade View: Grade III Tube size: 7.5 mm Number of attempts: 1 Airway Equipment and Method: Video-laryngoscopy Placement Confirmation: ETT inserted through vocal cords under direct vision,  CO2 detector and Breath sounds checked- equal and bilateral Secured at: 26 cm Tube secured with: ETT holder Dental Injury: Teeth and Oropharynx as per pre-operative assessment  Difficulty Due To: Difficult Airway- due to large tongue    Ultrasound ED Peripheral IV (Provider) Date/Time: 05/21/2019 10:13 AM Performed by: Margette Fast, MD Authorized by: Margette Fast, MD   Procedure details:    Indications: multiple failed IV attempts and poor IV access  Skin Prep: chlorhexidine gluconate     Location: Right EJ.   Angiocath:  18 G   Bedside Ultrasound Guided: Yes     Images: not archived     Patient tolerated procedure without complications: Yes     Dressing applied: Yes       ____________________________________________   INITIAL IMPRESSION / ASSESSMENT AND PLAN / ED COURSE  Pertinent labs & imaging results that were available during my care of the patient were reviewed by me and considered in my medical decision making (see chart for details).   Patient presents to the emergency department in acute respiratory distress.  He has a history of severe systolic CHF, aortic stenosis, thoracic aortic aneurysm.  He is not experiencing chest pain.  He appears acutely volume overloaded and hypoxic.  SARS-CoV-2 is a consideration but given the presentation  I feel that CHF is more likely.  Elected to manage the patient initially with close system BiPAP.  Very good mask seal at bedside.  Patient immediately feeling improved.  Will follow labs and CXR.   09:40 AM  The patient began to initially improve after being placed on BiPAP, however, he later developed increased work of breathing and diaphoresis.  I discussed with him that we should proceed to intubation and patient gave verbal consent.  Intubation performed as above. Patient did have continued hypoxemia after intubation with sats in the high 70s to low 80s.  He required up to 18 of PEEP to improve his sats.  Starting nitro drip.  Consulted critical care for assistance with managing. EJ was placed by me due to need for additional IV access and difficult IV placement in alternate location.   Discussed patient's case with ICU to request admission. Patient and family (if present) updated with plan. Care transferred to ICU service.  I reviewed all nursing notes, vitals, pertinent old records, EKGs, labs, imaging (as available).  ____________________________________________  FINAL CLINICAL IMPRESSION(S) / ED DIAGNOSES  Final diagnoses:  Acute respiratory failure with hypoxia (HCC)     MEDICATIONS GIVEN DURING THIS VISIT:  Medications  fentaNYL (SUBLIMAZE) injection 100 mcg (100 mcg Intravenous Given 05/11/19 1002)  fentaNYL (SUBLIMAZE) injection 100 mcg (has no administration in time range)  midazolam (VERSED) injection 2 mg (2 mg Intravenous Given 05/11/19 0954)  midazolam (VERSED) injection 2 mg (has no administration in time range)  pantoprazole (PROTONIX) injection 40 mg (has no administration in time range)  acetaminophen (TYLENOL) tablet 650 mg (has no administration in time range)  ondansetron (ZOFRAN) injection 4 mg (has no administration in time range)  albuterol (PROVENTIL) (2.5 MG/3ML) 0.083% nebulizer solution 2.5 mg (has no administration in time range)  hydrALAZINE (APRESOLINE)  injection 10 mg (has no administration in time range)  furosemide (LASIX) injection 40 mg (has no administration in time range)  insulin aspart (novoLOG) injection 0-20 Units (has no administration in time range)  aspirin tablet 325 mg (has no administration in time range)  azithromycin (ZITHROMAX) 500 mg in sodium chloride 0.9 % 250 mL IVPB (has no administration in time range)  etomidate (AMIDATE) injection (100 mg Intravenous Given 05/11/19 0906)  rocuronium (ZEMURON) injection (100 mg Intravenous Given 05/11/19 0907)  hydrALAZINE (APRESOLINE) injection 10 mg (10 mg Intravenous Given 05/11/19 1000)    Note:  This document was prepared using Dragon voice recognition software and may include unintentional dictation errors.  Nanda Quinton, MD Emergency Medicine    Long, [facility], MD 05/11/19 1137    Margette Fast, MD 05/21/19 1014

## 2019-05-11 NOTE — ED Notes (Signed)
Unable to get temperature on patient at the time due to patient being very SOB, and Doctors needing to come in immediately and see patient.

## 2019-05-11 NOTE — ED Triage Notes (Signed)
Patient arrived via [facility] with increased shortness of breathe X 1 week. He received 324 ASA and 1 Nitro from EMS without any change. EMS reports that patient was 86%RA, PT arrived with NRB at about 90-92%   Brother's name Trinna Post [phone]

## 2019-05-12 ENCOUNTER — Inpatient Hospital Stay (HOSPITAL_COMMUNITY): Payer: Medicare Other

## 2019-05-12 DIAGNOSIS — J9601 Acute respiratory failure with hypoxia: Principal | ICD-10-CM

## 2019-05-12 LAB — POCT I-STAT 7, (LYTES, BLD GAS, ICA,H+H)
Acid-base deficit: 3 mmol/L — ABNORMAL HIGH (ref 0.0–2.0)
Bicarbonate: 20.3 mmol/L (ref 20.0–28.0)
Calcium, Ion: 1.18 mmol/L (ref 1.15–1.40)
HCT: 43 % (ref 39.0–52.0)
Hemoglobin: 14.6 g/dL (ref 13.0–17.0)
O2 Saturation: 99 %
Potassium: 4.4 mmol/L (ref 3.5–5.1)
Sodium: 141 mmol/L (ref 135–145)
TCO2: 21 mmol/L — ABNORMAL LOW (ref 22–32)
pCO2 arterial: 30.7 mmHg — ABNORMAL LOW (ref 32.0–48.0)
pH, Arterial: 7.428 (ref 7.350–7.450)
pO2, Arterial: 121 mmHg — ABNORMAL HIGH (ref 83.0–108.0)

## 2019-05-12 LAB — CBC
HCT: 44.3 % (ref 39.0–52.0)
Hemoglobin: 14.7 g/dL (ref 13.0–17.0)
MCH: 27.5 pg (ref 26.0–34.0)
MCHC: 33.2 g/dL (ref 30.0–36.0)
MCV: 83 fL (ref 80.0–100.0)
Platelets: 170 10*3/uL (ref 150–400)
RBC: 5.34 MIL/uL (ref 4.22–5.81)
RDW: 15.2 % (ref 11.5–15.5)
WBC: 12.7 10*3/uL — ABNORMAL HIGH (ref 4.0–10.5)
nRBC: 0 % (ref 0.0–0.2)

## 2019-05-12 LAB — PHOSPHORUS: Phosphorus: 3 mg/dL (ref 2.5–4.6)

## 2019-05-12 LAB — GLUCOSE, CAPILLARY
Glucose-Capillary: 105 mg/dL — ABNORMAL HIGH (ref 70–99)
Glucose-Capillary: 106 mg/dL — ABNORMAL HIGH (ref 70–99)
Glucose-Capillary: 109 mg/dL — ABNORMAL HIGH (ref 70–99)
Glucose-Capillary: 115 mg/dL — ABNORMAL HIGH (ref 70–99)
Glucose-Capillary: 118 mg/dL — ABNORMAL HIGH (ref 70–99)
Glucose-Capillary: 125 mg/dL — ABNORMAL HIGH (ref 70–99)

## 2019-05-12 LAB — BASIC METABOLIC PANEL
Anion gap: 16 — ABNORMAL HIGH (ref 5–15)
BUN: 26 mg/dL — ABNORMAL HIGH (ref 6–20)
CO2: 18 mmol/L — ABNORMAL LOW (ref 22–32)
Calcium: 8.8 mg/dL — ABNORMAL LOW (ref 8.9–10.3)
Chloride: 107 mmol/L (ref 98–111)
Creatinine, Ser: 2.15 mg/dL — ABNORMAL HIGH (ref 0.61–1.24)
GFR calc Af Amer: 38 mL/min — ABNORMAL LOW (ref >60)
GFR calc non Af Amer: 33 mL/min — ABNORMAL LOW (ref >60)
Glucose, Bld: 137 mg/dL — ABNORMAL HIGH (ref 70–99)
Potassium: 4.3 mmol/L (ref 3.5–5.1)
Sodium: 141 mmol/L (ref 135–145)

## 2019-05-12 LAB — HEPARIN LEVEL (UNFRACTIONATED)
Heparin Unfractionated: 0.5 IU/mL (ref 0.30–0.70)
Heparin Unfractionated: 0.53 IU/mL (ref 0.30–0.70)

## 2019-05-12 LAB — COOXEMETRY PANEL
Carboxyhemoglobin: 1.2 % (ref 0.5–1.5)
Methemoglobin: 1.6 % — ABNORMAL HIGH (ref 0.0–1.5)
O2 Saturation: 75.4 %
Total hemoglobin: 15.1 g/dL (ref 12.0–16.0)

## 2019-05-12 LAB — MAGNESIUM: Magnesium: 1.7 mg/dL (ref 1.7–2.4)

## 2019-05-12 MED ORDER — MAGNESIUM SULFATE 2 GM/50ML IV SOLN
2.0000 g | Freq: Once | INTRAVENOUS | Status: AC
  Administered 2019-05-12: 15:00:00 2 g via INTRAVENOUS
  Filled 2019-05-12: qty 50

## 2019-05-12 MED ORDER — PANTOPRAZOLE SODIUM 40 MG PO PACK
40.0000 mg | PACK | ORAL | Status: DC
  Administered 2019-05-12 – 2019-05-13 (×2): 40 mg
  Filled 2019-05-12 (×2): qty 20

## 2019-05-12 MED ORDER — CHLORHEXIDINE GLUCONATE 0.12 % MT SOLN
OROMUCOSAL | Status: AC
  Filled 2019-05-12: qty 30

## 2019-05-12 MED ORDER — ACETAMINOPHEN 325 MG PO TABS
650.0000 mg | ORAL_TABLET | ORAL | Status: DC | PRN
  Administered 2019-05-13: 14:00:00 650 mg
  Filled 2019-05-12: qty 2

## 2019-05-12 NOTE — Progress Notes (Addendum)
NAME:  [name], MRN:  [id number], DOB:  02-May-1961, LOS: 1 ADMISSION DATE:  05/11/2019, CONSULTATION DATE:  05/11/19 REFERRING MD:  Long - ED, CHIEF COMPLAINT:  Dyspnea   Brief History   58 yo M with progressive SOB and chest pain, intubated in ED for acute decompensated HF   Past Medical History  sCHF HTN A flutter CKD Bicuspid aortic valve s/p AVR  Significant Hospital Events   5/24 intubated in ED, admit to ICU  5/25: Fully awake.  Chest x-ray improving.  Hemodynamically stable on inotropic support.  Weaning PEEP and FiO2 remains positive volume status Consults:  PCCM  Procedures:  5/24 ETT >>>   Significant Diagnostic Tests:  Echocardiogram 5/24:Small fixed thrombus in the apical wall of the left ventricle.  EF is severely reduced down to 20 to 25% cavity moderately dilated the right ventricle systolic pressure reduced right atrial size dilated left atrial dilated bioprosthetic valve present Micro Data:  5/24 Sars CoV -2 > negative  Sputum culture 5/24: Abundant white blood cells, rare GPC are rare GPC Antimicrobials:  Azithromycin 5/24>>  Ceftriaxone  5/24 >>   Interim history/subjective:  Distress.  Wants ventilator out  Objective   Blood pressure (Abnormal) 141/125, pulse 90, temperature 98.5 F (36.9 C), temperature source Oral, resp. rate (Abnormal) 26, height 5\' 11"  (1.803 m), weight 115 kg, SpO2 98 %.    Vent Mode: PRVC FiO2 (%):  [50 %-100 %] 50 % Set Rate:  [26 bmp] 26 bmp Vt Set:  [500 mL-600 mL] 600 mL PEEP:  [16 cmH20-18 cmH20] 18 cmH20 Plateau Pressure:  [28 cmH20-32 cmH20] 28 cmH20   Intake/Output Summary (Last 24 hours) at 05/12/2019 0934 Last data filed at 05/12/2019 0900 Gross per 24 hour  Intake 966.82 ml  Output 535 ml  Net 431.82 ml   Filed Weights   05/11/19 0838  Weight: 115 kg    Examination: General 58 year old male patient resting on mechanical ventilator he is in no acute distress this morning HEENT normocephalic  atraumatic no jugular venous distention orally intubated Pulmonary: Coarse, equal chest rise bilaterally on mechanical assisted breath Cardiac: Regular rate and rhythm Abdomen: Soft nontender Extremities: Warm and dry brisk capillary refill Neuro: Awake, interactive, follows commands, moves all extremities, writes on chalkboard appropriately asking appropriate questions.  Resolved Hospital Problem list     Assessment & Plan:   Acute Respiratory failure requiring intubation due to CHF exacerbation with flash pulmonary edema vs PNA.  portable checks x-ray personally reviewed:Endotracheal tubes in satisfactory position.  There is significant acutely, aeration improved when comparing film, improving pulmonary edema bilaterally -Weaned on high PEEP Plan Have reduced PEEP from 18 down to 12, continue to wean FiO2 We will follow-up later this morning, if remains stable without worsening cardiac function or hypoxia will reduce PEEP further down to 8 and initiate pressure support later this afternoon Continue IV Lasix, aiming for negative intake output Continuing cardiogenic support A.m. chest x-ray VAP bundle Follow-up sputum culture, day #2 ceftriaxone and azithromycin, we can likely DC these on 5/26 if cultures remain negative   Cardiogenic shock with known CHFrEF and acute biventricular heart failure (20-25 %)  Atrial Flutter Aortic Valve Disease, and chronic PAH/right heart failure Left apical thrombus -Started on inotrope infusion 5/24 Plan Continuing telemetry monitoring Continue aspirin Continue heparin, needs indefinite anticoagulation Continuing dobutamine per cardiology services Further recommendations as directed by cardiology   CKD stage 3, looks like baseline creatinine about 1.2 Plan Trend chemistries awaiting for today  Avoiding hypotension Renal dose medications when appropriate Strict intake and output  DM2 non compliant at home Plan New current sliding scale  coverage  Best practice:  Diet: NPO  Pain/Anxiety/Delirium protocol (if indicated): PRN fentanyl, PRN versed  VAP protocol (if indicated): yes DVT prophylaxis: SCD GI prophylaxis: protonix Glucose control: SSI  Mobility: bedrest Code Status: Full  Family Communication: pending  Disposition: admit to ICU  Looking better.  Hemodynamically stable on inotropic support.  Starting to wean PEEP and FiO2.  We will try to do this rather aggressively however need to balance this out with acute cardiac issues.  Do not want to over tax cardiac system especially given recent reduction in EF.  Awaiting follow-up from cardiology, for now we will continue inotropic support where it sat  Critical care time: 32 minutes    Erick Colace ACNP-BC [place] Pager # [phone] OR # [phone] if no answer  Remains on increased PEEP/FiO2, dobutamine.  BP 115/85   Pulse 71   Temp 98.5 F (36.9 C) (Oral)   Resp 16   Ht 5\' 11"  (1.803 m)   Wt 115 kg   SpO2 95%   BMI 35.36 kg/m   Alert.  HR regular.  Scattered rhonchi.  Abdomen soft.  1+ edema.  CXR - interstitial edema (reviewed by me)  A/p  Acute hypoxic respiratory failure. - goal SpO2 90 to 95%  Acute on chronic systolic CHF. - continue dobutamine - negative fluid balance as able  Community acquired pneumonia. - continue ABx  CC time by me independent of APP time 31 minutes  Chesley Mires, MD [place] 05/12/2019, 11:23 AM

## 2019-05-12 NOTE — Progress Notes (Signed)
RT note: patient does not meet SBT criteria this AM due to PEEP and FIO2 requirements.  Did decrease patient's FIO2 from 70% to 50% with sats maintaining at 100%.  Tolerating current ventilator settings well at this time.  RT will continue to monitor.

## 2019-05-12 NOTE — Progress Notes (Signed)
Advanced Heart Failure Rounding Note   Subjective:    Remains on the vent. Awake and communicative.   Now on 50% FiO2 weaning PEEP. Plan possible extubation later tonight.   On dobutamine 5 (co-ox 75% but drawn off midline PICC). Diuresing very well.    Creatinine 1.7 -> 2.1 (baseline 1.8)  Low grade temp PCT 4.5 Lactate 2.6  Objective:   Weight Range:  Vital Signs:   Temp:  [98.5 F (36.9 C)-99.6 F (37.6 C)] 99.1 F (37.3 C) (05/25 1200) Pulse Rate:  [57-137] 74 (05/25 1200) Resp:  [17-26] 18 (05/25 1200) BP: (74-141)/(48-125) 125/84 (05/25 1200) SpO2:  [95 %-100 %] 98 % (05/25 1200) FiO2 (%):  [50 %-100 %] 50 % (05/25 1115) Last BM Date: (PTA)  Weight change: Filed Weights   05/11/19 0838  Weight: 115 kg    Intake/Output:   Intake/Output Summary (Last 24 hours) at 05/12/2019 1329 Last data filed at 05/12/2019 1200 Gross per 24 hour  Intake 1420.9 ml  Output 535 ml  Net 885.9 ml     Physical Exam: General:  On vent awake following commands  HEENT: normal Neck: supple. JVP hard to see . Carotids 2+ bilat; + bruits. No lymphadenopathy or thryomegaly appreciated. Cor: PMI nondisplaced. Regular rate & rhythm. 2/6 AS crisp s2 Lungs: clear Abdomen: obese soft, nontender, nondistended. No hepatosplenomegaly. No bruits or masses. Good bowel sounds. Extremities: no cyanosis, clubbing, rash, edema Neuro: alert & orientedx3, cranial nerves grossly intact. moves all 4 extremities w/o difficulty. Affect pleasant  Telemetry: Suspect atrial flutter 70s Personally reviewed   Labs: Basic Metabolic Panel: Recent Labs  Lab 05/11/19 0843 05/11/19 1008 05/12/19 0423 05/12/19 0943  NA 142 139 141 141  K 3.9 4.3 4.4 4.3  CL 101  --   --  107  CO2 22  --   --  18*  GLUCOSE 272*  --   --  137*  BUN 10  --   --  26*  CREATININE 1.71*  --   --  2.15*  CALCIUM 9.6  --   --  8.8*  MG  --   --   --  1.7  PHOS  --   --   --  3.0    Liver Function Tests: Recent  Labs  Lab 05/11/19 0843  AST 26  ALT 23  ALKPHOS 105  BILITOT 1.7*  PROT 8.4*  ALBUMIN 4.1   No results for input(s): LIPASE, AMYLASE in the last 168 hours. No results for input(s): AMMONIA in the last 168 hours.  CBC: Recent Labs  Lab 05/11/19 0843 05/11/19 1008 05/12/19 0423 05/12/19 0943  WBC 14.4*  --   --  12.7*  NEUTROABS 11.7*  --   --   --   HGB 17.3* 18.0* 14.6 14.7  HCT 55.8* 53.0* 43.0 44.3  MCV 86.9  --   --  83.0  PLT 253  --   --  170    Cardiac Enzymes: Recent Labs  Lab 05/11/19 0843  TROPONINI 0.14*    BNP: BNP (last 3 results) Recent Labs    11/29/18 0808 05/11/19 0844  BNP 1,232.9* 1,980.2*    ProBNP (last 3 results) No results for input(s): PROBNP in the last 8760 hours.    Other results:  Imaging: Dg Chest Port 1 View  Result Date: 05/12/2019 CLINICAL DATA:  58 year old male with history of acute respiratory failure. EXAM: PORTABLE CHEST 1 VIEW COMPARISON:  Chest x-ray 05/11/2019. FINDINGS: An endotracheal tube  is in place with tip 7.0 cm above the carina. A nasogastric tube is seen extending into the stomach, however, the tip of the nasogastric tube extends below the lower margin of the image. Lung volumes are normal. There continues to be some patchy areas of interstitial prominence and airspace disease most evident throughout the mid to lower lungs bilaterally (left greater than right), with significant improved aeration compared to the yesterday's examination. Small left pleural effusion. No right pleural effusion. No pneumothorax. Surgical clips project over the lateral aspect of the right apically region. Mild cardiomegaly. Upper mediastinal contours are within normal limits. Aortic atherosclerosis. Status post median sternotomy for aortic valve replacement. IMPRESSION: 1. Support apparatus and postoperative changes, as above. 2. Improving aeration throughout the lungs bilaterally, favored to reflect resolving multilobar pneumonia. 3.  Small left pleural effusion. Electronically Signed   By: Vinnie Langton M.D.   On: 05/12/2019 05:08   Dg Chest Portable 1 View  Result Date: 05/11/2019 CLINICAL DATA:  58 year old male with history of respiratory failure. EXAM: PORTABLE CHEST 1 VIEW COMPARISON:  Chest CT 12/04/2018. FINDINGS: An endotracheal tube is in place with tip 6.3 cm above the carina. A nasogastric tube is seen extending into the stomach, however, the tip of the nasogastric tube extends below the lower margin of the image. Transcutaneous defibrillator pad projecting over the upper left hemithorax. Lung volumes are normal. Diffuse interstitial prominence and patchy airspace consolidation, most evident throughout the mid to lower lungs bilaterally, but asymmetrically distributed (right greater than left). No definite pleural effusions. Mild cardiomegaly. Upper mediastinal contours are distorted by patient positioning. Status post median sternotomy for aortic valve replacement. Aortic atherosclerosis. IMPRESSION: 1. Support apparatus and postoperative changes, as above. 2. Patchy multifocal asymmetrically distributed interstitial and airspace consolidation concerning for multilobar pneumonia. 3. Aortic atherosclerosis. Electronically Signed   By: Vinnie Langton M.D.   On: 05/11/2019 10:27   Vas [place] Lower Extremity Venous (dvt)  Result Date: 05/12/2019  Lower Venous Study Indications: Edema, and SOB.  Risk Factors: CHF, EF of 15%. Limitations: Ventilation. Comparison Study: Prior negative study from 04/19/15 is available for comparison Performing Technologist: Sharion Dove RVS  Examination Guidelines: A complete evaluation includes B-mode imaging, spectral Doppler, color Doppler, and power Doppler as needed of all accessible portions of each vessel. Bilateral testing is considered an integral part of a complete examination. Limited examinations for reoccurring indications may be performed as noted.   +---------+---------------+---------+-----------+----------+-------+  RIGHT     Compressibility Phasicity Spontaneity Properties Summary  +---------+---------------+---------+-----------+----------+-------+  CFV       Full            Yes       Yes                             +---------+---------------+---------+-----------+----------+-------+  SFJ       Full                                                      +---------+---------------+---------+-----------+----------+-------+  FV Prox   Full                                                      +---------+---------------+---------+-----------+----------+-------+  FV Mid    Full                                                      +---------+---------------+---------+-----------+----------+-------+  FV Distal Full                                                      +---------+---------------+---------+-----------+----------+-------+  PFV       Full                                                      +---------+---------------+---------+-----------+----------+-------+  POP       Full            Yes       Yes                             +---------+---------------+---------+-----------+----------+-------+  PTV       Full                                                      +---------+---------------+---------+-----------+----------+-------+  PERO      Full                                                      +---------+---------------+---------+-----------+----------+-------+   +---------+---------------+---------+-----------+----------+-------+  LEFT      Compressibility Phasicity Spontaneity Properties Summary  +---------+---------------+---------+-----------+----------+-------+  CFV       Full            Yes       Yes                             +---------+---------------+---------+-----------+----------+-------+  SFJ       Full                                                      +---------+---------------+---------+-----------+----------+-------+  FV Prox   Full                                                       +---------+---------------+---------+-----------+----------+-------+  FV Mid    Full                                                      +---------+---------------+---------+-----------+----------+-------+  FV Distal Full                                                      +---------+---------------+---------+-----------+----------+-------+  PFV       Full                                                      +---------+---------------+---------+-----------+----------+-------+  POP       Full            Yes       Yes                             +---------+---------------+---------+-----------+----------+-------+  PTV       Full                                                      +---------+---------------+---------+-----------+----------+-------+  PERO      Full                                                      +---------+---------------+---------+-----------+----------+-------+     Summary: Right: There is no evidence of deep vein thrombosis in the lower extremity. Left: There is no evidence of deep vein thrombosis in the lower extremity.  *See table(s) above for measurements and observations. Electronically signed by Deitra Mayo MD on 05/12/2019 at 6:42:20 AM.    Final       Medications:     Scheduled Medications:  aspirin  325 mg Per Tube Daily   chlorhexidine gluconate (MEDLINE KIT)  15 mL Mouth Rinse BID   Chlorhexidine Gluconate Cloth  6 each Topical Q0600   furosemide  40 mg Intravenous Q12H   insulin aspart  0-20 Units Subcutaneous Q4H   mouth rinse  15 mL Mouth Rinse 10 times per day   mupirocin ointment  1 application Nasal BID   pantoprazole sodium  40 mg Per Tube Q24H     Infusions:  azithromycin Stopped (05/12/19 1107)   cefTRIAXone (ROCEPHIN)  IV Stopped (05/12/19 1159)   DOBUTamine 5 mcg/kg/min (05/12/19 1200)   heparin 1,500 Units/hr (05/12/19 1200)     PRN Medications:  acetaminophen,  albuterol, fentaNYL (SUBLIMAZE) injection, hydrALAZINE, midazolam, ondansetron (ZOFRAN) IV, sodium chloride flush   Assessment/plan:   1. Acute systolic HF/cardiogenic shock with biventricular HF - Echo 05/11/19 EF 20-25% with moderately decreased RV function  - Echo 12/19 EF 50% - Due to NICM (coronaries normal in 2016). Suspect due to probable tachy-induced CM (Was in AFL in 140s on admit) - Much improved with dobutamine. Will continue for now. Co-ox looks good but drawn off midline.  - remains volume overloaded but improving. Continue IV lasix  2. PAFL  - suspect this may be cause of CM  - however previous ECGs also suggest possible AV dissociation -  rate now controlled - will need EP to see in am - may benefit from ablation +/- CRT  3. Acute hypoxic respiratory failure - likely due to pulmonary edema  - improving with diuresis - hopefully can extubate today. D/w CCM - also being treated for PNA  4. AK on CKD 3 - baseline creatinine 1.8. creatinine up slightly in setting of shock - hopefully will improve with dobutamine support  5. 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 LV apical thrombus - continue heparin -> warfarin eventually  8. Morbid obesity - will need weight loss   9. Hypomag - will supp  CRITICAL CARE Performed by: Glori Bickers  Total critical care time: 35 minutes  Critical care time was exclusive of separately billable procedures and treating other patients.  Critical care was necessary to treat or prevent imminent or life-threatening deterioration.  Critical care was time spent personally by me (independent of midlevel providers or residents) on the following activities: development of treatment plan with patient and/or surrogate as well as nursing, discussions with consultants, evaluation of patient's response to treatment, examination of patient, obtaining history from patient or surrogate, ordering  and performing treatments and interventions, ordering and review of laboratory studies, ordering and review of radiographic studies, pulse oximetry and re-evaluation of patient's condition.     Length of Stay: 1   Glori Bickers 05/12/2019, 1:29 PM  Advanced Heart Failure Team Pager [phone] (M-F; 7a - 4p)  Please contact [facility] for night-coverage after hours (4p -7a ) and weekends on [email or web address]

## 2019-05-12 NOTE — Progress Notes (Signed)
Pt is still intubated, however communicating appropriately via white board that he would like his phone, charger, & wallet.  These items checked out of security and left with pt per pt.  Patient signed belongings envelope stating he is aware of risk involved in keeping his valuables at bedside, & this document is filed within the hard chart with Shanon Rosser, NT in witness.

## 2019-05-12 NOTE — Progress Notes (Signed)
ANTICOAGULATION CONSULT NOTE  Pharmacy Consult for heparin Indication: VTE   Patient Measurements: Height: 5\' 11"  (180.3 cm) Weight: 253 lb 8.5 oz (115 kg) IBW/kg (Calculated) : 75.3 Heparin Dosing Weight: 100kg  Vital Signs: Temp: 98.5 F (36.9 C) (05/25 0809) Temp Source: Oral (05/25 0809) BP: 95/70 (05/25 0930) Pulse Rate: 83 (05/25 0930)  Labs: Recent Labs    05/11/19 0843 05/11/19 1008 05/11/19 1217 05/11/19 2347 05/12/19 0423 05/12/19 0943  HGB 17.3* 18.0*  --   --  14.6 14.7  HCT 55.8* 53.0*  --   --  43.0 44.3  PLT 253  --   --   --   --  170  APTT  --   --  30  --   --   --   LABPROT  --   --  17.4*  --   --   --   INR  --   --  1.4*  --   --   --   HEPARINUNFRC  --   --   --  0.50  --  0.53  CREATININE 1.71*  --   --   --   --  2.15*  TROPONINI 0.14*  --   --   --   --   --      Medical History: Past Medical History:  Diagnosis Date  . Aortic valve disease    a. 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 Atrial flutter ([place])   . Benign hypertensive heart and renal disease   . Chronic combined systolic and diastolic CHF (congestive heart failure) ([place])   . Diabetes mellitus ([place])   . Essential hypertension 11/19/2014  . Insomnia   . Murmur   . Noncompliance   . Obesity   . Pulmonary hypertension (HCC)    Assessment: 58 yoM admitted with SOB requiring intubation. Labs reveal slightly elevated troponins and D-dimer >20. CTA unable to be obtained due to Cr elevation. Baseline INR and aPTT normal, CBC wnl, no anticoagulation PTA.  Heparin level is therapeutic on 1500 units/hr. Hgb 14.7, plt wnl. Duplex neg for DVTs. ECHO showing small, fixed thrombus on apical wall of LV.    Goal of Therapy:  Heparin level 0.3-0.7 units/ml Monitor platelets by anticoagulation protocol: Yes   Plan:  -Continue heparin infusion at 1500 units/hr -Daily heparin level and CBC -F/U oral anticoagulation  plans   Hughes Better M] 05/12/2019 10:43 AM

## 2019-05-12 NOTE — Progress Notes (Signed)
ANTICOAGULATION CONSULT NOTE  Pharmacy Consult for heparin Indication: VTE  No Known Allergies  Patient Measurements: Height: 5\' 11"  (180.3 cm) Weight: 253 lb 8.5 oz (115 kg) IBW/kg (Calculated) : 75.3 Heparin Dosing Weight: 100kg  Vital Signs: Temp: 99.4 F (37.4 C) (05/24 2353) Temp Source: Oral (05/24 2353) BP: 92/81 (05/24 2300) Pulse Rate: 79 (05/24 2300)  Labs: Recent Labs    05/11/19 0843 05/11/19 1008 05/11/19 1217 05/11/19 2347  HGB 17.3* 18.0*  --   --   HCT 55.8* 53.0*  --   --   PLT 253  --   --   --   APTT  --   --  30  --   LABPROT  --   --  17.4*  --   INR  --   --  1.4*  --   HEPARINUNFRC  --   --   --  0.50  CREATININE 1.71*  --   --   --   TROPONINI 0.14*  --   --   --     Estimated Creatinine Clearance: 60.7 mL/min (A) (by C-G formula based on SCr of 1.71 mg/dL (H)).   Medical History: Past Medical History:  Diagnosis Date  . Aortic valve disease    a. 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 Atrial flutter ([place])   . Benign hypertensive heart and renal disease   . Chronic combined systolic and diastolic CHF (congestive heart failure) ([place])   . Diabetes mellitus ([place])   . Essential hypertension 11/19/2014  . Insomnia   . Murmur   . Noncompliance   . Obesity   . Pulmonary hypertension (HCC)    Assessment: 5 yoM admitted with SOB requiring intubation. Labs reveal slightly elevated troponins and D-dimer >20. CTA unable to be obtained due to Cr elevation. Baseline INR and aPTT normal, CBC wnl, no anticoagulation PTA.  Heparin level came back therapeutic at 0.5, on 1500 units/hr. Hgb 18, plt 253. Duplex neg for DVTs. ECHO showing small, fixed thrombus on apical wall of LV.    Goal of Therapy:  Heparin level 0.3-0.7 units/ml Monitor platelets by anticoagulation protocol: Yes   Plan:  -Continue heparin infusion at 1500 units/hr -Check 6hr confirmatory heparin level -Daily  heparin level and CBC -F/U oral anticoagulation plans  Antonietta Jewel, PharmD, BCCCP Clinical Pharmacist  Pager: [phone] Phone: [phone] Please check AMION for all [place] numbers 05/12/2019

## 2019-05-13 ENCOUNTER — Inpatient Hospital Stay (HOSPITAL_COMMUNITY): Payer: Medicare Other

## 2019-05-13 DIAGNOSIS — I5023 Acute on chronic systolic (congestive) heart failure: Secondary | ICD-10-CM

## 2019-05-13 DIAGNOSIS — I495 Sick sinus syndrome: Secondary | ICD-10-CM

## 2019-05-13 DIAGNOSIS — I5021 Acute systolic (congestive) heart failure: Secondary | ICD-10-CM

## 2019-05-13 DIAGNOSIS — I440 Atrioventricular block, first degree: Secondary | ICD-10-CM

## 2019-05-13 LAB — GLUCOSE, CAPILLARY
Glucose-Capillary: 104 mg/dL — ABNORMAL HIGH (ref 70–99)
Glucose-Capillary: 162 mg/dL — ABNORMAL HIGH (ref 70–99)
Glucose-Capillary: 94 mg/dL (ref 70–99)
Glucose-Capillary: 98 mg/dL (ref 70–99)
Glucose-Capillary: 97 mg/dL (ref 70–99)

## 2019-05-13 LAB — MAGNESIUM
Magnesium: 2 mg/dL (ref 1.7–2.4)
Magnesium: 2.3 mg/dL (ref 1.7–2.4)

## 2019-05-13 LAB — BASIC METABOLIC PANEL
Anion gap: 12 (ref 5–15)
BUN: 25 mg/dL — ABNORMAL HIGH (ref 6–20)
CO2: 23 mmol/L (ref 22–32)
Calcium: 9 mg/dL (ref 8.9–10.3)
Chloride: 108 mmol/L (ref 98–111)
Creatinine, Ser: 1.9 mg/dL — ABNORMAL HIGH (ref 0.61–1.24)
GFR calc Af Amer: 44 mL/min — ABNORMAL LOW (ref >60)
GFR calc non Af Amer: 38 mL/min — ABNORMAL LOW (ref >60)
Glucose, Bld: 123 mg/dL — ABNORMAL HIGH (ref 70–99)
Potassium: 4.2 mmol/L (ref 3.5–5.1)
Sodium: 143 mmol/L (ref 135–145)

## 2019-05-13 LAB — CBC
HCT: 41.9 % (ref 39.0–52.0)
Hemoglobin: 13.4 g/dL (ref 13.0–17.0)
MCH: 27.3 pg (ref 26.0–34.0)
MCHC: 32 g/dL (ref 30.0–36.0)
MCV: 85.5 fL (ref 80.0–100.0)
Platelets: 172 10*3/uL (ref 150–400)
RBC: 4.9 MIL/uL (ref 4.22–5.81)
RDW: 15.2 % (ref 11.5–15.5)
WBC: 12.6 10*3/uL — ABNORMAL HIGH (ref 4.0–10.5)
nRBC: 0 % (ref 0.0–0.2)

## 2019-05-13 LAB — PHOSPHORUS
Phosphorus: 3.3 mg/dL (ref 2.5–4.6)
Phosphorus: 3.9 mg/dL (ref 2.5–4.6)

## 2019-05-13 LAB — HEPARIN LEVEL (UNFRACTIONATED): Heparin Unfractionated: 0.41 IU/mL (ref 0.30–0.70)

## 2019-05-13 MED ORDER — CHLORHEXIDINE GLUCONATE 0.12 % MT SOLN
OROMUCOSAL | Status: AC
  Administered 2019-05-13: 09:00:00 15 mL via OROMUCOSAL
  Filled 2019-05-13: qty 15

## 2019-05-13 MED ORDER — VITAL HIGH PROTEIN PO LIQD
1000.0000 mL | ORAL | Status: DC
  Administered 2019-05-14: 09:00:00 1000 mL

## 2019-05-13 MED ORDER — PROPOFOL 1000 MG/100ML IV EMUL
INTRAVENOUS | Status: AC
  Administered 2019-05-13: 08:00:00 5 ug/kg/min via INTRAVENOUS
  Filled 2019-05-13: qty 100

## 2019-05-13 MED ORDER — SPIRONOLACTONE 12.5 MG HALF TABLET
12.5000 mg | ORAL_TABLET | Freq: Every day | ORAL | Status: DC
  Administered 2019-05-13: 14:00:00 12.5 mg via ORAL
  Filled 2019-05-13: qty 1

## 2019-05-13 MED ORDER — HYDRALAZINE HCL 20 MG/ML IJ SOLN
10.0000 mg | INTRAMUSCULAR | Status: DC | PRN
  Administered 2019-05-14 (×2): 10 mg via INTRAVENOUS
  Filled 2019-05-13: qty 1

## 2019-05-13 MED ORDER — PRO-STAT SUGAR FREE PO LIQD
60.0000 mL | Freq: Four times a day (QID) | ORAL | Status: DC
  Administered 2019-05-13 – 2019-05-14 (×4): 60 mL
  Filled 2019-05-13 (×4): qty 60

## 2019-05-13 MED ORDER — HYDRALAZINE HCL 25 MG PO TABS
25.0000 mg | ORAL_TABLET | Freq: Three times a day (TID) | ORAL | Status: DC
  Administered 2019-05-14: 08:00:00 25 mg
  Filled 2019-05-13 (×2): qty 1

## 2019-05-13 MED ORDER — SPIRONOLACTONE 12.5 MG HALF TABLET
12.5000 mg | ORAL_TABLET | Freq: Every day | ORAL | Status: DC
  Administered 2019-05-14: 10:00:00 12.5 mg
  Filled 2019-05-13: qty 1

## 2019-05-13 MED ORDER — VITAL HIGH PROTEIN PO LIQD
1000.0000 mL | ORAL | Status: DC
  Administered 2019-05-13: 09:00:00 1000 mL

## 2019-05-13 MED ORDER — PROPOFOL 1000 MG/100ML IV EMUL
0.0000 ug/kg/min | INTRAVENOUS | Status: DC
  Administered 2019-05-13: 22:00:00 50 ug/kg/min via INTRAVENOUS
  Administered 2019-05-13: 09:00:00 30 ug/kg/min via INTRAVENOUS
  Administered 2019-05-13 (×3): 50 ug/kg/min via INTRAVENOUS
  Administered 2019-05-13: 08:00:00 5 ug/kg/min via INTRAVENOUS
  Administered 2019-05-13: 50 ug/kg/min via INTRAVENOUS
  Administered 2019-05-14: 07:00:00 35 ug/kg/min via INTRAVENOUS
  Administered 2019-05-14: 03:00:00 45 ug/kg/min via INTRAVENOUS
  Administered 2019-05-14: 50 ug/kg/min via INTRAVENOUS
  Filled 2019-05-13 (×8): qty 100

## 2019-05-13 MED ORDER — PRO-STAT SUGAR FREE PO LIQD
30.0000 mL | Freq: Two times a day (BID) | ORAL | Status: DC
  Administered 2019-05-13: 09:00:00 30 mL
  Filled 2019-05-13: qty 30

## 2019-05-13 MED FILL — Medication: Qty: 1 | Status: AC

## 2019-05-13 NOTE — Progress Notes (Signed)
Initial Nutrition Assessment   RD working remotely.   DOCUMENTATION CODES:   Obesity unspecified  INTERVENTION:   OG tube needs to be advanced (currently in esophagus-midthoracic region per chest xray this AM); discussed with RN  Tube feeding:  Vital High Protein at 20 ml/hr Pro-Stat 60 mL QID Provides 1280 kcals 162 g of protein and 403 mL of free water  NUTRITION DIAGNOSIS:   Inadequate oral intake related to acute illness as evidenced by NPO status.  GOAL:   Patient will meet greater than or equal to 90% of their needs  MONITOR:   Vent status, Labs, Weight trends, TF tolerance  REASON FOR ASSESSMENT:   Ventilator    ASSESSMENT:   58 yo male admitted with acute respiratory failure requiring intubation, cardiogenic shock with known heart failure EF 20-25. PMH includes CHF, HTN, CKD III, DM  5/24 Intubated 5/26 Developed acute respiratory distress likely related to mucous plugging, possible flash pulmonary edema  Patient is currently intubated on ventilator support MV: 16.4 L/min Temp (24hrs), Avg:99.5 F (37.5 C), Min:98.9 F (37.2 C), Max:100.8 F (38.2 C)  Propofol: 34.9 ml/hr  Current wt 116.6 kg; admission wt 115 kg. Net negative 1.7 L. Pt with generalized non-pitting edema. Unsure of dry weight at this time  Unable to obtain diet and weight history at this time  Chest xray this AM post rapid response indicating OG tube located in midthoracic esophagus. Advancement needed prior to use; discussed with RN  Labs: Creatinine 1.90, BUN 25, CBGs 104-162 Meds: lasix, ss novolog, mag sulfate  NUTRITION - FOCUSED PHYSICAL EXAM:  Unable to assess  Diet Order:   Diet Order            Diet NPO time specified  Diet effective now              EDUCATION NEEDS:   Not appropriate for education at this time  Skin:  Skin Assessment: Reviewed RN Assessment  Last BM:  no documented BM  Height:   Ht Readings from Last 1 Encounters:  05/11/19 5\' 11"   (1.803 m)    Weight:   Wt Readings from Last 1 Encounters:  05/13/19 116.6 kg    Ideal Body Weight:  78.2 kg  BMI:  Body mass index is 35.85 kg/m.  Estimated Nutritional Needs:   Kcal:  5003-7048 kcals   Protein:  156-195 g   Fluid:  >/= 1.7 L   Kerman Passey MS, RD, LDN, CNSC [phone] Pager  [phone] Weekend/On-Call Pager

## 2019-05-13 NOTE — Progress Notes (Signed)
Chest Xray showed "nasogastric tube only in the midthoracic esophagus." Tube feeds stopped. OG tube advanced, abdominal xray ordered. Will restart tube feeds after appropriate OG placement determined by xray.  Donnetta Simpers, RN

## 2019-05-13 NOTE — Progress Notes (Addendum)
Pharmacist approached Probation officer & asked why MD was not notified for Dobutamime titration off this shift.  Writer pulled up MAR to display titration MAR MAP goal of >65, (ScVO2 not obtainable-[employer].)  Upon Dobutamine titration off BP was 195/162 with MAP of 174 at 2015, tolerated well.

## 2019-05-13 NOTE — Code Documentation (Signed)
FPTS Code Blue Progress Note  Responded to rapid CODE blue page. Upon arrival, patient had returned to normal cardio-respiratory status. Code blue canceled.   Mina Marble End[place], DO 05/13/2019, 7:28 AM PGY-1, [facility] pager [phone]

## 2019-05-13 NOTE — Progress Notes (Signed)
RN entered pt's room at [facility] on 5/26 with Jannifer Franklin, RN, to complete bedside report. Pt asked for suctioning, after suctioning patient wrote on his white board asking for "more oxygen." Pulse ox reading 94%.  Lung sounds diminished bilaterally upon auscultation, oxygen increased to 100%. RT paged. Pt became frantic, reaching towards ETT. O2 sat reading high 80s to low 90s. Bilateral lung sounds very minimal bilaterally upon ausculation. RN paged RT again asking them to come STAT. Pt became extremely agitated, reaching for ETT, became violent towards nursing staff. PRN Fentanyl given. Mills Koller, RN, entered room to assist with patient. Elink paged. Decreased BP, increased respirations and HR, no breath sounds with ausculation, Code blue called. Brandy B, RT, entered room, began to bag and levage pt. Marni Griffon, NP, at bedside.  After levaging, bilateral breath sounds heard on ausculation by RT and RN. Pt never lost pulse and regained appropriate oxygenation, placed back on ventilator by RT. Will continue to monitor.   Donnetta Simpers, RN

## 2019-05-13 NOTE — Progress Notes (Signed)
NAME:  [name], MRN:  [id number], DOB:  1961/11/19, LOS: 2 ADMISSION DATE:  05/11/2019, CONSULTATION DATE:  05/11/19 REFERRING MD:  Long - ED, CHIEF COMPLAINT:  Dyspnea   Brief History   58 yo M with progressive SOB and chest pain, intubated in ED for acute decompensated HF   Past Medical History  sCHF HTN A flutter CKD Bicuspid aortic valve s/p AVR  Significant Hospital Events   5/24 intubated in ED, admit to ICU  5/25: Fully awake.  Chest x-ray improving.  Hemodynamically stable on inotropic support.  Weaning PEEP and FiO2 remains positive volume status 5/25 through 5/26: Weaning PEEP and FiO2 support.  Successfully weaned down to 50% FiO2 and PEEP down to 8 from initial starting point of 18.  Over the evening hours of 5/23 into 5/24 early a.m. he became progressively hypertensive.  Dobutamine was stopped overnight.  Hypertension persisted.  At approximately 7 AM he developed an acute episode of hypoxia, shortness of breath, and associated agitation and restlessness.  A CODE BLUE was called, he never lost pulses, however was hypoxic.  He was bag lavaged, and then manually assisted ventilation.  Respiratory therapy did remove a fairly large mucous plug, however it is difficult to say at the time if this was the culprit.  After stabilized he was placed on propofol infusion to reevaluate pulmonary situation.  Hydralazine added for afterload reduction, dobutamine capped off. Consults:  PCCM  Procedures:  5/24 ETT >>>   Significant Diagnostic Tests:  Echocardiogram 5/24:Small fixed thrombus in the apical wall of the left ventricle.  EF is severely reduced down to 20 to 25% cavity moderately dilated the right ventricle systolic pressure reduced right atrial size dilated left atrial dilated bioprosthetic valve present Micro Data:  5/24 Sars CoV -2 > negative  Sputum culture 5/24: Abundant white blood cells, rare GPC are rare GPC Antimicrobials:  Azithromycin 5/24>>  Ceftriaxone   5/24 >>   Interim history/subjective:  Developed acute respiratory distress this morning.  Dobutamine was stopped last night due to hypertension, unclear if there was any physician communication on this.  He developed acute hypoxia requiring bag mask ventilation.  Objective   Blood pressure (Abnormal) 150/117, pulse 64, temperature 99.1 F (37.3 C), temperature source Oral, resp. rate 19, height 5\' 11"  (1.803 m), weight 116.6 kg, SpO2 96 %.    Vent Mode: PRVC FiO2 (%):  [40 %-50 %] 40 % Set Rate:  [14 bmp-26 bmp] 14 bmp Vt Set:  [600 mL] 600 mL PEEP:  [8 HER74-08 cmH20] 8 cmH20 Plateau Pressure:  [19 cmH20-28 cmH20] 21 cmH20   Intake/Output Summary (Last 24 hours) at 05/13/2019 1448 Last data filed at 05/13/2019 0600 Gross per 24 hour  Intake 892.11 ml  Output 3025 ml  Net -2132.89 ml   Filed Weights   05/11/19 0838 05/13/19 0500  Weight: 115 kg 116.6 kg    Examination: General this is a 58 year old African-American male he is now sedated on the mechanical ventilator following an acute episode of decompensation HEENT normocephalic atraumatic orally intubated sclera are nonicteric pupils are equal and reactive there is no JVD Pulmonary: Diminished both bases, accessory muscle use improving slowly Cardiac: Tachycardic regular rate and rhythm Abdomen: Soft nontender no organomegaly Extremities: Warm and dry trace lower extremity edema brisk capillary refill, pulses are strong GU: Clear yellow urine has condom catheter in place Neuro: Now awake and oriented and cooperative was agitated initially and restless  Resolved Hospital Problem list  Assessment & Plan:   Acute Respiratory failure requiring intubation due to CHF exacerbation with flash pulmonary edema vs PNA. -Sputum culture still pending -Acutely decompensated this morning reporting shortness of breath, was hypoxic required bag lavage, mucous plug was removed -Had been doing well prior to acute event, now at -1.7 L.,   I suspect this was mucous plug however certainly flash pulmonary edema given his new worsening cardiomyopathy is a consideration Plan Continue full ventilator support today PAD protocol RASS goal -1 Repeating stat chest x-ray Following up ABG later this morning after stabilized Day #3 ceftriaxone and azithromycin, will stop azithromycin at day 5 and ceftriaxone at 7 Continue diuresis  Cardiogenic shock with known CHFrEF and acute biventricular heart failure (20-25 %)  Atrial Flutter Aortic Valve Disease, and chronic PAH/right heart failure Left apical thrombus -Started on inotrope infusion 5/24, cardiology thinks perhaps acute decompensation was exacerbated by paroxysmal atrial fibrillation -dobutamine stopped last night.  Plan Continuing aspirin  Continuing IV heparin will need indefinite anticoagulation  Appreciate cardiology assistance, electrophysiology consult pending Adding hydralazine for SBP > 170 Continue diuretic regimen Continue telemetry monitoring   CKD stage 3, looks like baseline creatinine about 1.8 -Renal function continues to improve, tolerating current diuretic regimen Plan Continue strict intake and output  Renal dose medications  Avoid hypotension  A.m. chemistry   DM2  Glycemic control is been excellent here  plan Continue current sliding scale regimen  Best practice:  Diet: NPO  Pain/Anxiety/Delirium protocol (if indicated): PRN fentanyl, PRN versed  VAP protocol (if indicated): yes DVT prophylaxis: SCD GI prophylaxis: protonix Glucose control: SSI  Mobility: bedrest Code Status: Full  Family Communication: pending  Disposition: Need to back up a little bit today and reevaluate the situation.  He had been progressing nicely, and had felt I could get him extubated today.  Overall I think he still progressing, his dobutamine was stopped last night, this was reportedly stopped due to hypertension, it looks as though the hypertension persisted in  spite of this.  Is unclear to me the cause of his acute, decompensation this morning, I think it was either mucous plugging or flash pulmonary edema due to need for better afterload reduction.  For now we will sedate him, help him recover from this morning, obtain a stat chest x-ray, add afterload reduction, and reevaluate over the course of the day.  Overall I think he is slowly improving we just may need to make some adjustments in his overall regimen  Critical care time:45 minutes.     Erick Colace ACNP-BC [place] Pager # [phone] OR # [phone] if no answer

## 2019-05-13 NOTE — Progress Notes (Signed)
ANTICOAGULATION CONSULT NOTE  Pharmacy Consult for heparin Indication: VTE   Patient Measurements: Height: 5\' 11"  (180.3 cm) Weight: 257 lb 0.9 oz (116.6 kg) IBW/kg (Calculated) : 75.3 Heparin Dosing Weight: 100kg  Vital Signs: Temp: 99.1 F (37.3 C) (05/26 0443) Temp Source: Oral (05/26 0443) BP: 150/117 (05/26 0600) Pulse Rate: 64 (05/26 0600)  Labs: Recent Labs    05/11/19 0843  05/11/19 1217 05/11/19 2347 05/12/19 0423 05/12/19 0943 05/13/19 0646  HGB 17.3*   < >  --   --  14.6 14.7 13.4  HCT 55.8*   < >  --   --  43.0 44.3 41.9  PLT 253  --   --   --   --  170 172  APTT  --   --  30  --   --   --   --   LABPROT  --   --  17.4*  --   --   --   --   INR  --   --  1.4*  --   --   --   --   HEPARINUNFRC  --   --   --  0.50  --  0.53 0.41  CREATININE 1.71*  --   --   --   --  2.15*  --   TROPONINI 0.14*  --   --   --   --   --   --    < > = values in this interval not displayed.     Medical History: Past Medical History:  Diagnosis Date  . Aortic valve disease    a. 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 Atrial flutter ([place])   . Benign hypertensive heart and renal disease   . Chronic combined systolic and diastolic CHF (congestive heart failure) ([place])   . Diabetes mellitus ([place])   . Essential hypertension 11/19/2014  . Insomnia   . Murmur   . Noncompliance   . Obesity   . Pulmonary hypertension (HCC)    Assessment: 81 yoM admitted with SOB requiring intubation. Labs reveal slightly elevated troponins and D-dimer >20. Baseline INR and aPTT normal, CBC wnl, no anticoagulation PTA.  Heparin level is therapeutic on 1500 units/hr. Hgb 13.4, plt wnl. 5/24 Duplex neg for DVTs. 5/24 ECHO showing small, fixed thrombus on apical wall of LV.    Goal of Therapy:  Heparin level 0.3-0.7 units/ml Monitor platelets by anticoagulation protocol: Yes   Plan:  -Continue heparin infusion at 1500  units/hr -Daily heparin level and CBC -F/U oral anticoagulation plans  Harrietta Guardian, PharmD PGY1 Pharmacy Resident 05/13/2019    8:26 AM Please check AMION for all [place] numbers
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
## 2019-05-13 NOTE — Progress Notes (Signed)
RT called to patient room due to patient not having any air movement through any lung fields.  Upon arrival patient's heart rate was rapidly declining, along with sats, and code blue was getting called.  Attempted to bag patient however unable to bag.  Lavaged patient with saline and was able to obtain a moderate amount of thick, white, frothy, mucus plugs.  After lavaging patient patient began initiating own breaths with RT assisting with bag ventilation.  Patient also noted to have bilateral breath sounds after lavaging.  Heart rate began to rise and sats began to improve.  Patient then began to arouse and stabilize.  Placed patient back on ventilator and increased FIO2 to 100%.  Bilateral breath sounds were still auscultated.  STAT chest xray was ordered before arrival to room.  RN giving medication to help relax patient.  MD aware.  Tolerating settings well at this time.  Will continue to monitor.

## 2019-05-13 NOTE — Progress Notes (Signed)
Advanced Heart Failure Rounding Note   Subjective:    Remains on the vent. He was planned for extubation this am but apparently mucus plugged and had respiratory arrest and code blue called. He has recovered. He did not lose a pulse. He is now sedated on the vent.   Dobutamine stopped overnight. Continues to diurese. Out 3L  Creatinine 1.7 -> 2.1 (baseline 1.8) -> 2.9. He is in rate-controlled AF now.    Objective:   Weight Range:  Vital Signs:   Temp:  [98.9 F (37.2 C)-100.8 F (38.2 C)] 100.8 F (38.2 C) (05/26 0805) Pulse Rate:  [54-103] 65 (05/26 1052) Resp:  [14-30] 14 (05/26 1052) BP: (90-195)/(59-162) 90/59 (05/26 1052) SpO2:  [93 %-100 %] 96 % (05/26 1052) FiO2 (%):  [40 %-100 %] 100 % (05/26 0745) Weight:  [116.6 kg] 116.6 kg (05/26 0500) Last BM Date: (pta)  Weight change: Filed Weights   05/11/19 0838 05/13/19 0500  Weight: 115 kg 116.6 kg    Intake/Output:   Intake/Output Summary (Last 24 hours) at 05/13/2019 1119 Last data filed at 05/13/2019 1043 Gross per 24 hour  Intake 934.54 ml  Output 3025 ml  Net -2090.46 ml     Physical Exam: General:  Intubated sedated HEENT: normal +ETT Neck: supple. JVP 8-9 Carotids 2+ bilat; no bruits. No lymphadenopathy or thryomegaly appreciated. Cor: PMI nondisplaced. Irregular rate & rhythm. No rubs, gallops or murmurs. Lungs: clear Abdomen: soft, nontender, nondistended. No hepatosplenomegaly. No bruits or masses. Good bowel sounds. Extremities: no cyanosis, clubbing, rash, edema Neuro: intubated sedated   Telemetry: AF 60s + PVCs Personally reviewed    Labs: Basic Metabolic Panel: Recent Labs  Lab 05/11/19 0843 05/11/19 1008 05/12/19 0423 05/12/19 0943 05/13/19 0646  NA 142 139 141 141 143  K 3.9 4.3 4.4 4.3 4.2  CL 101  --   --  107 108  CO2 22  --   --  18* 23  GLUCOSE 272*  --   --  137* 123*  BUN 10  --   --  26* 25*  CREATININE 1.71*  --   --  2.15* 1.90*  CALCIUM 9.6  --   --  8.8*  9.0  MG  --   --   --  1.7 2.0  PHOS  --   --   --  3.0 3.3    Liver Function Tests: Recent Labs  Lab 05/11/19 0843  AST 26  ALT 23  ALKPHOS 105  BILITOT 1.7*  PROT 8.4*  ALBUMIN 4.1   No results for input(s): LIPASE, AMYLASE in the last 168 hours. No results for input(s): AMMONIA in the last 168 hours.  CBC: Recent Labs  Lab 05/11/19 0843 05/11/19 1008 05/12/19 0423 05/12/19 0943 05/13/19 0646  WBC 14.4*  --   --  12.7* 12.6*  NEUTROABS 11.7*  --   --   --   --   HGB 17.3* 18.0* 14.6 14.7 13.4  HCT 55.8* 53.0* 43.0 44.3 41.9  MCV 86.9  --   --  83.0 85.5  PLT 253  --   --  170 172    Cardiac Enzymes: Recent Labs  Lab 05/11/19 0843  TROPONINI 0.14*    BNP: BNP (last 3 results) Recent Labs    11/29/18 0808 05/11/19 0844  BNP 1,232.9* 1,980.2*    ProBNP (last 3 results) No results for input(s): PROBNP in the last 8760 hours.    Other results:  Imaging: Dg Chest  Port 1 View  Result Date: 05/13/2019 CLINICAL DATA:  Arrest.  Intubation. EXAM: PORTABLE CHEST 1 VIEW COMPARISON:  Earlier same day FINDINGS: Endotracheal tube tip remains 6.5 cm above the carina. Nasogastric tube tip is in the mid chest level. Previous median sternotomy and aortic valve replacement. Cardiomegaly. Atelectasis in the left lower lobe. IMPRESSION: Endotracheal tube well position 6.5 cm above the carina. Nasogastric tube only in the midthoracic esophagus. Electronically Signed   By: Nelson Chimes M.D.   On: 05/13/2019 08:09   Dg Chest Port 1 View  Result Date: 05/13/2019 CLINICAL DATA:  Pulmonary edema. EXAM: PORTABLE CHEST 1 VIEW COMPARISON:  05/12/2019. FINDINGS: Endotracheal tube, NG tube in stable position. Stable cardiomegaly. Prior median sternotomy and cardiac valve replacement. Continued improvement of bibasilar atelectasis/infiltrates. Small left pleural effusion again noted. No pneumothorax. IMPRESSION: 1.  Lines and tubes stable position. 2.  Stable cardiomegaly.  Prior  cardiac valve replacement. 3. Continued improvement of bibasilar atelectasis/infiltrates. Small left pleural effusion again noted. Electronically Signed   By: Marcello Moores  Register   On: 05/13/2019 06:10   Dg Chest Port 1 View  Result Date: 05/12/2019 CLINICAL DATA:  58 year old male with history of acute respiratory failure. EXAM: PORTABLE CHEST 1 VIEW COMPARISON:  Chest x-ray 05/11/2019. FINDINGS: An endotracheal tube is in place with tip 7.0 cm above the carina. A nasogastric tube is seen extending into the stomach, however, the tip of the nasogastric tube extends below the lower margin of the image. Lung volumes are normal. There continues to be some patchy areas of interstitial prominence and airspace disease most evident throughout the mid to lower lungs bilaterally (left greater than right), with significant improved aeration compared to the yesterday's examination. Small left pleural effusion. No right pleural effusion. No pneumothorax. Surgical clips project over the lateral aspect of the right apically region. Mild cardiomegaly. Upper mediastinal contours are within normal limits. Aortic atherosclerosis. Status post median sternotomy for aortic valve replacement. IMPRESSION: 1. Support apparatus and postoperative changes, as above. 2. Improving aeration throughout the lungs bilaterally, favored to reflect resolving multilobar pneumonia. 3. Small left pleural effusion. Electronically Signed   By: Vinnie Langton M.D.   On: 05/12/2019 05:08   Vas [place] Lower Extremity Venous (dvt)  Result Date: 05/12/2019  Lower Venous Study Indications: Edema, and SOB.  Risk Factors: CHF, EF of 15%. Limitations: Ventilation. Comparison Study: Prior negative study from 04/19/15 is available for comparison Performing Technologist: Sharion Dove RVS  Examination Guidelines: A complete evaluation includes B-mode imaging, spectral Doppler, color Doppler, and power Doppler as needed of all accessible portions of each vessel.  Bilateral testing is considered an integral part of a complete examination. Limited examinations for reoccurring indications may be performed as noted.  +---------+---------------+---------+-----------+----------+-------+  RIGHT     Compressibility Phasicity Spontaneity Properties Summary  +---------+---------------+---------+-----------+----------+-------+  CFV       Full            Yes       Yes                             +---------+---------------+---------+-----------+----------+-------+  SFJ       Full                                                      +---------+---------------+---------+-----------+----------+-------+  FV Prox   Full                                                      +---------+---------------+---------+-----------+----------+-------+  FV Mid    Full                                                      +---------+---------------+---------+-----------+----------+-------+  FV Distal Full                                                      +---------+---------------+---------+-----------+----------+-------+  PFV       Full                                                      +---------+---------------+---------+-----------+----------+-------+  POP       Full            Yes       Yes                             +---------+---------------+---------+-----------+----------+-------+  PTV       Full                                                      +---------+---------------+---------+-----------+----------+-------+  PERO      Full                                                      +---------+---------------+---------+-----------+----------+-------+   +---------+---------------+---------+-----------+----------+-------+  LEFT      Compressibility Phasicity Spontaneity Properties Summary  +---------+---------------+---------+-----------+----------+-------+  CFV       Full            Yes       Yes                             +---------+---------------+---------+-----------+----------+-------+   SFJ       Full                                                      +---------+---------------+---------+-----------+----------+-------+  FV Prox   Full                                                      +---------+---------------+---------+-----------+----------+-------+  FV Mid    Full                                                      +---------+---------------+---------+-----------+----------+-------+  FV Distal Full                                                      +---------+---------------+---------+-----------+----------+-------+  PFV       Full                                                      +---------+---------------+---------+-----------+----------+-------+  POP       Full            Yes       Yes                             +---------+---------------+---------+-----------+----------+-------+  PTV       Full                                                      +---------+---------------+---------+-----------+----------+-------+  PERO      Full                                                      +---------+---------------+---------+-----------+----------+-------+     Summary: Right: There is no evidence of deep vein thrombosis in the lower extremity. Left: There is no evidence of deep vein thrombosis in the lower extremity.  *See table(s) above for measurements and observations. Electronically signed by Deitra Mayo MD on 05/12/2019 at 6:42:20 AM.    Final      Medications:     Scheduled Medications:  aspirin  325 mg Per Tube Daily   chlorhexidine gluconate (MEDLINE KIT)  15 mL Mouth Rinse BID   Chlorhexidine Gluconate Cloth  6 each Topical Q0600   feeding supplement (PRO-STAT SUGAR FREE 64)  60 mL Per Tube QID   [START ON 05/14/2019] feeding supplement (VITAL HIGH PROTEIN)  1,000 mL Per Tube Q24H   furosemide  40 mg Intravenous Q12H   hydrALAZINE  25 mg Per Tube Q8H   insulin aspart  0-20 Units Subcutaneous Q4H   mouth rinse  15 mL Mouth Rinse 10 times per day     mupirocin ointment  1 application Nasal BID   pantoprazole sodium  40 mg Per Tube Q24H    Infusions:  azithromycin 250 mL/hr at 05/13/19 1043   cefTRIAXone (ROCEPHIN)  IV Stopped (05/12/19 1159)   heparin 1,500 Units/hr (05/13/19 1043)   propofol (DIPRIVAN) infusion 50 mcg/kg/min (05/13/19 1043)    PRN Medications: acetaminophen, albuterol, fentaNYL (SUBLIMAZE) injection, hydrALAZINE, midazolam, ondansetron (ZOFRAN) IV,  sodium chloride flush   Assessment/plan:   1. Acute systolic HF/cardiogenic shock with biventricular HF - Echo 05/11/19 EF 20-25% with moderately decreased RV function  - Echo 12/19 EF 50% - Due to NICM (coronaries normal in 2016). Suspect due to probable tachy-induced CM (Was in AFL in 140s on admit) - Initially iimproved with dobutamine. Now off but remains stable. Will continue to hold inotropes - Remains volume overloaded but improving. Continue IV lasix to support extubation  - Continue hydralazine. Add spiro 12.5   2. PAFL/ AFIB - suspect this may be cause of CM  - however previous ECGs also suggest possible AV dissociation with accelerated junctional outpacing his sinus - Now in AF with rate control - I have asked EP to see to question whether or not he may benefit from CRTpacing to help improve his CM and allow [place] to use AA as needed (versus AVN ablation) - Continue heparin   3. Acute hypoxic respiratory failure - likely due to pulmonary edema  - improving with diuresis - hopefully can extubate tomorrow. D/w CCM - also being treated for PNA  4. AK on CKD 3 - baseline creatinine 1.8. creatinine stable 1.9 today - can resume dobutamine as needed  5. 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 LV apical thrombus - continue heparin -> warfarin eventually  8. Morbid obesity - will need weight loss   9. Hypomag - supped  CRITICAL CARE Performed by: Glori Bickers  Total critical care time: 35  minutes  Critical care time was exclusive of separately billable procedures and treating other patients.  Critical care was necessary to treat or prevent imminent or life-threatening deterioration.  Critical care was time spent personally by me (independent of midlevel providers or residents) on the following activities: development of treatment plan with patient and/or surrogate as well as nursing, discussions with consultants, evaluation of patient's response to treatment, examination of patient, obtaining history from patient or surrogate, ordering and performing treatments and interventions, ordering and review of laboratory studies, ordering and review of radiographic studies, pulse oximetry and re-evaluation of patient's condition.     Length of Stay: 2   Glori Bickers 05/13/2019, 11:19 AM  Advanced Heart Failure Team Pager [phone] (M-F; 7a - 4p)  Please contact [facility] for night-coverage after hours (4p -7a ) and weekends on [email or web address]

## 2019-05-14 ENCOUNTER — Inpatient Hospital Stay (HOSPITAL_COMMUNITY): Payer: Medicare Other

## 2019-05-14 LAB — COMPREHENSIVE METABOLIC PANEL
ALT: 47 U/L — ABNORMAL HIGH (ref 0–44)
AST: 36 U/L (ref 15–41)
Albumin: 3.1 g/dL — ABNORMAL LOW (ref 3.5–5.0)
Alkaline Phosphatase: 80 U/L (ref 38–126)
Anion gap: 13 (ref 5–15)
BUN: 41 mg/dL — ABNORMAL HIGH (ref 6–20)
CO2: 27 mmol/L (ref 22–32)
Calcium: 9.2 mg/dL (ref 8.9–10.3)
Chloride: 105 mmol/L (ref 98–111)
Creatinine, Ser: 2.03 mg/dL — ABNORMAL HIGH (ref 0.61–1.24)
GFR calc Af Amer: 41 mL/min — ABNORMAL LOW (ref >60)
GFR calc non Af Amer: 35 mL/min — ABNORMAL LOW (ref >60)
Glucose, Bld: 123 mg/dL — ABNORMAL HIGH (ref 70–99)
Potassium: 4.2 mmol/L (ref 3.5–5.1)
Sodium: 145 mmol/L (ref 135–145)
Total Bilirubin: 1 mg/dL (ref 0.3–1.2)
Total Protein: 6.5 g/dL (ref 6.5–8.1)

## 2019-05-14 LAB — CBC
HCT: 43.2 % (ref 39.0–52.0)
Hemoglobin: 13.7 g/dL (ref 13.0–17.0)
MCH: 27.2 pg (ref 26.0–34.0)
MCHC: 31.7 g/dL (ref 30.0–36.0)
MCV: 85.9 fL (ref 80.0–100.0)
Platelets: 180 10*3/uL (ref 150–400)
RBC: 5.03 MIL/uL (ref 4.22–5.81)
RDW: 15.3 % (ref 11.5–15.5)
WBC: 9.5 10*3/uL (ref 4.0–10.5)
nRBC: 0 % (ref 0.0–0.2)

## 2019-05-14 LAB — GLUCOSE, CAPILLARY
Glucose-Capillary: 100 mg/dL — ABNORMAL HIGH (ref 70–99)
Glucose-Capillary: 111 mg/dL — ABNORMAL HIGH (ref 70–99)
Glucose-Capillary: 114 mg/dL — ABNORMAL HIGH (ref 70–99)
Glucose-Capillary: 115 mg/dL — ABNORMAL HIGH (ref 70–99)
Glucose-Capillary: 77 mg/dL (ref 70–99)
Glucose-Capillary: 95 mg/dL (ref 70–99)
Glucose-Capillary: 99 mg/dL (ref 70–99)

## 2019-05-14 LAB — MAGNESIUM: Magnesium: 2.3 mg/dL (ref 1.7–2.4)

## 2019-05-14 LAB — HEPARIN LEVEL (UNFRACTIONATED): Heparin Unfractionated: 0.36 IU/mL (ref 0.30–0.70)

## 2019-05-14 LAB — PHOSPHORUS: Phosphorus: 4.6 mg/dL (ref 2.5–4.6)

## 2019-05-14 LAB — COOXEMETRY PANEL
Carboxyhemoglobin: 1.4 % (ref 0.5–1.5)
Methemoglobin: 1 % (ref 0.0–1.5)
O2 Saturation: 74.9 %
Total hemoglobin: 13.4 g/dL (ref 12.0–16.0)

## 2019-05-14 LAB — TRIGLYCERIDES: Triglycerides: 146 mg/dL (ref <150)

## 2019-05-14 MED ORDER — ACETAMINOPHEN 325 MG PO TABS
650.0000 mg | ORAL_TABLET | ORAL | Status: DC | PRN
  Administered 2019-05-15: 23:00:00 650 mg via ORAL
  Filled 2019-05-14: qty 2

## 2019-05-14 MED ORDER — SENNOSIDES 8.8 MG/5ML PO SYRP
15.0000 mL | ORAL_SOLUTION | Freq: Two times a day (BID) | ORAL | Status: DC
  Administered 2019-05-14 (×2): 15 mL
  Filled 2019-05-14 (×4): qty 15

## 2019-05-14 MED ORDER — SPIRONOLACTONE 12.5 MG HALF TABLET
12.5000 mg | ORAL_TABLET | Freq: Every day | ORAL | Status: DC
  Administered 2019-05-15 – 2019-05-23 (×9): 12.5 mg via ORAL
  Filled 2019-05-14 (×9): qty 1

## 2019-05-14 MED ORDER — HYDRALAZINE HCL 25 MG PO TABS
25.0000 mg | ORAL_TABLET | Freq: Three times a day (TID) | ORAL | Status: DC
  Administered 2019-05-14: 14:00:00 25 mg via ORAL
  Filled 2019-05-14: qty 1

## 2019-05-14 MED ORDER — PANTOPRAZOLE SODIUM 40 MG PO PACK: 40.0000 mg | PACK | ORAL | Status: DC

## 2019-05-14 MED ORDER — ORAL CARE MOUTH RINSE
15.0000 mL | Freq: Two times a day (BID) | OROMUCOSAL | Status: DC
  Administered 2019-05-14 – 2019-05-15 (×2): 15 mL via OROMUCOSAL

## 2019-05-14 MED ORDER — ISOSORB DINITRATE-HYDRALAZINE 20-37.5 MG PO TABS
1.0000 | ORAL_TABLET | Freq: Three times a day (TID) | ORAL | Status: DC
  Administered 2019-05-14 – 2019-05-23 (×24): 1 via ORAL
  Filled 2019-05-14 (×26): qty 1

## 2019-05-14 NOTE — Progress Notes (Signed)
Advanced Heart Failure Rounding Note   Subjective:    Remains on the vent. Now weaning.   Off dobutamine. Co-ox 75% (but from midline).   Creatinine starting to bump. IV lasix stopped .   Remains in AF - rate controlled   Objective:   Weight Range:  Vital Signs:   Temp:  [98.6 F (37 C)-99.7 F (37.6 C)] 99.7 F (37.6 C) (05/27 0800) Pulse Rate:  [50-72] 68 (05/27 0802) Resp:  [14-19] 19 (05/27 0802) BP: (83-161)/(51-112) 137/96 (05/27 0802) SpO2:  [95 %-100 %] 99 % (05/27 0802) FiO2 (%):  [40 %-60 %] 40 % (05/27 0900) Weight:  [117.1 kg] 117.1 kg (05/27 0500) Last BM Date: (PTA)  Weight change: Filed Weights   05/11/19 0838 05/13/19 0500 05/14/19 0500  Weight: 115 kg 116.6 kg 117.1 kg    Intake/Output:   Intake/Output Summary (Last 24 hours) at 05/14/2019 0942 Last data filed at 05/14/2019 0754 Gross per 24 hour  Intake 1519.88 ml  Output 1880 ml  Net -360.12 ml     Physical Exam: General:  Intubated Awake HEENT: normal + ETT Neck: supple. no JVD. Carotids 2+ bilat; no bruits. No lymphadenopathy or thryomegaly appreciated. Cor: PMI nondisplaced. Regular rate & rhythm. No rubs, gallops or murmurs. Lungs: clear Abdomen: soft, nontender, nondistended. No hepatosplenomegaly. No bruits or masses. Good bowel sounds. Extremities: no cyanosis, clubbing, rash, edema Neuro: alert & orientedx3, cranial nerves grossly intact. moves all 4 extremities w/o difficulty. Affect pleasant   Telemetry: AF 60s + PVCs Personally reviewed    Labs: Basic Metabolic Panel: Recent Labs  Lab 05/11/19 0843 05/11/19 1008 05/12/19 0423 05/12/19 0943 05/13/19 0646 05/13/19 1649 05/14/19 0400  NA 142 139 141 141 143  --  145  K 3.9 4.3 4.4 4.3 4.2  --  4.2  CL 101  --   --  107 108  --  105  CO2 22  --   --  18* 23  --  27  GLUCOSE 272*  --   --  137* 123*  --  123*  BUN 10  --   --  26* 25*  --  41*  CREATININE 1.71*  --   --  2.15* 1.90*  --  2.03*  CALCIUM 9.6  --    --  8.8* 9.0  --  9.2  MG  --   --   --  1.7 2.0 2.3 2.3  PHOS  --   --   --  3.0 3.3 3.9 4.6    Liver Function Tests: Recent Labs  Lab 05/11/19 0843 05/14/19 0400  AST 26 36  ALT 23 47*  ALKPHOS 105 80  BILITOT 1.7* 1.0  PROT 8.4* 6.5  ALBUMIN 4.1 3.1*   No results for input(s): LIPASE, AMYLASE in the last 168 hours. No results for input(s): AMMONIA in the last 168 hours.  CBC: Recent Labs  Lab 05/11/19 0843 05/11/19 1008 05/12/19 0423 05/12/19 0943 05/13/19 0646 05/14/19 0400  WBC 14.4*  --   --  12.7* 12.6* 9.5  NEUTROABS 11.7*  --   --   --   --   --   HGB 17.3* 18.0* 14.6 14.7 13.4 13.7  HCT 55.8* 53.0* 43.0 44.3 41.9 43.2  MCV 86.9  --   --  83.0 85.5 85.9  PLT 253  --   --  170 172 180    Cardiac Enzymes: Recent Labs  Lab 05/11/19 0843  TROPONINI 0.14*    BNP:  BNP (last 3 results) Recent Labs    11/29/18 0808 05/11/19 0844  BNP 1,232.9* 1,980.2*    ProBNP (last 3 results) No results for input(s): PROBNP in the last 8760 hours.    Other results:  Imaging: Dg Abd 1 View  Result Date: 05/13/2019 CLINICAL DATA:  Orogastric tube placement. EXAM: ABDOMEN - 1 VIEW COMPARISON:  Radiograph December 03, 2018. FINDINGS: The bowel gas pattern is normal. Distal tip of enteric tube is seen in proximal stomach. No radio-opaque calculi or other significant radiographic abnormality are seen. IMPRESSION: Distal tip of enteric tube seen in proximal stomach. Electronically Signed   By: Marijo Conception M.D.   On: 05/13/2019 12:59   Dg Chest Port 1 View  Result Date: 05/14/2019 CLINICAL DATA:  Shortness of breath. EXAM: PORTABLE CHEST 1 VIEW COMPARISON:  Chest x-ray from yesterday. FINDINGS: Unchanged endotracheal tube. Enteric tube entering the stomach with the tip below the field of view. Stable cardiomegaly status post AVR. Normal pulmonary vascularity. Unchanged left basilar atelectasis/infiltrate. Unchanged small left pleural effusion. No pneumothorax. No  acute osseous abnormality. IMPRESSION: 1. Unchanged left basilar atelectasis and/or infiltrate with small left pleural effusion. Electronically Signed   By: Titus Dubin M.D.   On: 05/14/2019 09:29   Dg Chest Port 1 View  Result Date: 05/13/2019 CLINICAL DATA:  Arrest.  Intubation. EXAM: PORTABLE CHEST 1 VIEW COMPARISON:  Earlier same day FINDINGS: Endotracheal tube tip remains 6.5 cm above the carina. Nasogastric tube tip is in the mid chest level. Previous median sternotomy and aortic valve replacement. Cardiomegaly. Atelectasis in the left lower lobe. IMPRESSION: Endotracheal tube well position 6.5 cm above the carina. Nasogastric tube only in the midthoracic esophagus. Electronically Signed   By: Nelson Chimes M.D.   On: 05/13/2019 08:09   Dg Chest Port 1 View  Result Date: 05/13/2019 CLINICAL DATA:  Pulmonary edema. EXAM: PORTABLE CHEST 1 VIEW COMPARISON:  05/12/2019. FINDINGS: Endotracheal tube, NG tube in stable position. Stable cardiomegaly. Prior median sternotomy and cardiac valve replacement. Continued improvement of bibasilar atelectasis/infiltrates. Small left pleural effusion again noted. No pneumothorax. IMPRESSION: 1.  Lines and tubes stable position. 2.  Stable cardiomegaly.  Prior cardiac valve replacement. 3. Continued improvement of bibasilar atelectasis/infiltrates. Small left pleural effusion again noted. Electronically Signed   By: Marcello Moores  Register   On: 05/13/2019 06:10     Medications:     Scheduled Medications: . chlorhexidine gluconate (MEDLINE KIT)  15 mL Mouth Rinse BID  . Chlorhexidine Gluconate Cloth  6 each Topical Q0600  . feeding supplement (PRO-STAT SUGAR FREE 64)  60 mL Per Tube QID  . feeding supplement (VITAL HIGH PROTEIN)  1,000 mL Per Tube Q24H  . hydrALAZINE  25 mg Per Tube Q8H  . insulin aspart  0-20 Units Subcutaneous Q4H  . mouth rinse  15 mL Mouth Rinse 10 times per day  . mupirocin ointment  1 application Nasal BID  . pantoprazole sodium  40 mg  Per Tube Q24H  . sennosides  15 mL Per Tube BID  . spironolactone  12.5 mg Per Tube Daily    Infusions: . azithromycin Stopped (05/13/19 1105)  . cefTRIAXone (ROCEPHIN)  IV Stopped (05/13/19 1404)  . heparin 1,500 Units/hr (05/14/19 0751)  . propofol (DIPRIVAN) infusion 10 mcg/kg/min (05/14/19 0849)    PRN Medications: acetaminophen, albuterol, fentaNYL (SUBLIMAZE) injection, hydrALAZINE, midazolam, ondansetron (ZOFRAN) IV, sodium chloride flush   Assessment/plan:   1. Acute systolic HF/cardiogenic shock with biventricular HF - Echo 05/11/19 EF 20-25% with  moderately decreased RV function  - Echo 12/19 EF 50% - Due to NICM (coronaries normal in 2016). Suspect due to probable tachy-induced CM (Was in AFL in 140s on admit) but may also be related to HTN or OSA - Initially iimproved with dobutamine. Now off but remains stable. Will continue to hold inotropes - Volume status looks good. Creatinine bumping. Hold lasix. - Continue hydralazine and spiro 12.5   2. PAFL/ AFIB - suspect this may be cause of CM  - however previous ECGs also suggest possible AV dissociation with accelerated junctional outpacing his sinus - Now in AF with rate control - EP has seen. Will continue to follow. May need CRT-D at some point particularly as he will need carvedilol for his HF. D/w Dr. [name].  - Continue heparin for now. Switch to DOAC after extubation   3. Acute hypoxic respiratory failure - likely due to pulmonary edema  - improving with diuresis - hopefully can extubate today. D/w CCM - also being treated for PNA  4. AK on CKD 3 - baseline creatinine 1.8. creatinine up to 2.0 today - holding lasix  5. 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 LV apical thrombus - continue heparin -> warfarin eventually  8. Morbid obesity - will need weight loss   9. Hypertension - titrate hydralazine as needed  CRITICAL CARE Performed by: Glori Bickers  Total critical care time: 35 minutes  Critical care time was exclusive of separately billable procedures and treating other patients.  Critical care was necessary to treat or prevent imminent or life-threatening deterioration.  Critical care was time spent personally by me (independent of midlevel providers or residents) on the following activities: development of treatment plan with patient and/or surrogate as well as nursing, discussions with consultants, evaluation of patient's response to treatment, examination of patient, obtaining history from patient or surrogate, ordering and performing treatments and interventions, ordering and review of laboratory studies, ordering and review of radiographic studies, pulse oximetry and re-evaluation of patient's condition.   Length of Stay: 3   Glori Bickers MD 05/14/2019, 9:42 AM  Advanced Heart Failure Team Pager [phone] (M-F; 7a - 4p)  Please contact [facility] for night-coverage after hours (4p -7a ) and weekends on [email or web address]

## 2019-05-14 NOTE — Progress Notes (Signed)
NAME:  [name], MRN:  [id number], DOB:  08-19-1961, LOS: 3 ADMISSION DATE:  05/11/2019, CONSULTATION DATE:  05/11/19 REFERRING MD:  Long - ED, CHIEF COMPLAINT:  Dyspnea   Brief History   58 yo M with progressive SOB and chest pain, intubated in ED for acute decompensated HF   Past Medical History  sCHF HTN A flutter CKD Bicuspid aortic valve s/p AVR  Significant Hospital Events   5/24 intubated in ED, admit to ICU  5/25: Fully awake.  Chest x-ray improving.  Hemodynamically stable on inotropic support.  Weaning PEEP and FiO2 remains positive volume status 5/25 through 5/26: Weaning PEEP and FiO2 support.  Successfully weaned down to 50% FiO2 and PEEP down to 8 from initial starting point of 18.  Over the evening hours of 5/23 into 5/24 early a.m. he became progressively hypertensive.  Dobutamine was stopped overnight.  Hypertension persisted.  At approximately 7 AM he developed an acute episode of hypoxia, shortness of breath, and associated agitation and restlessness.  A CODE BLUE was called, he never lost pulses, however was hypoxic.  He was bag lavaged, and then manually assisted ventilation.  Respiratory therapy did remove a fairly large mucous plug, however it is difficult to say at the time if this was the culprit.  After stabilized he was placed on propofol infusion to reevaluate pulmonary situation.  Hydralazine added for afterload reduction, dobutamine left off.  5/26 through 5/27: No issues following initial morning event on five 2/26.  Sedated over the course of the day.  Added hydralazine for blood pressure management.  On a.m. labs on the 27th creatinine bump so further diuretics held.  Initiating spontaneous breathing trial on the 27th with PEEP titrated down to 5. Consults:  PCCM  Procedures:  5/24 ETT >>>   Significant Diagnostic Tests:  Echocardiogram 5/24:Small fixed thrombus in the apical wall of the left ventricle.  EF is severely reduced down to 20 to 25% cavity  moderately dilated the right ventricle systolic pressure reduced right atrial size dilated left atrial dilated bioprosthetic valve present Micro Data:  5/24 Sars CoV -2 > negative  Sputum culture 5/24: Abundant white blood cells, rare GPC are rare GPC Antimicrobials:  Azithromycin 5/24>>  Ceftriaxone  5/24 >>   Interim history/subjective:  No issues overnight.  Currently sedated.  Objective   Blood pressure (Abnormal) 137/96, pulse 68, temperature 99.4 F (37.4 C), temperature source Oral, resp. rate 19, height 5\' 11"  (1.803 m), weight 117.1 kg, SpO2 99 %.    Vent Mode: PRVC FiO2 (%):  [40 %-60 %] 40 % Set Rate:  [14 bmp] 14 bmp Vt Set:  [600 mL] 600 mL PEEP:  [5 cmH20-8 cmH20] 5 cmH20 Plateau Pressure:  [17 cmH20-21 cmH20] 17 cmH20   Intake/Output Summary (Last 24 hours) at 05/14/2019 0806 Last data filed at 05/14/2019 0754 Gross per 24 hour  Intake 1519.88 ml  Output 1880 ml  Net -360.12 ml   Filed Weights   05/11/19 0838 05/13/19 0500 05/14/19 0500  Weight: 115 kg 116.6 kg 117.1 kg    Examination: General 58 year old male patient currently sedated on ventilator he is in no acute distress HEENT normocephalic atraumatic no jugular venous distention orally intubated sclera nonicteric Pulmonary: Coarse scattered rhonchi equal chest rise on mechanical ventilator Cardiac: Regular irregular with atrial fibrillation on telemetry Abdomen: Soft nontender no organomegaly Extremities: Warm dry brisk cap refill no significant edema GU: Clear yellow urine Neuro: Currently sedated.   Resolved Hospital Problem list  Assessment & Plan:   Acute Respiratory failure requiring intubation due to CHF exacerbation with flash pulmonary edema vs PNA. -Sputum culture still pending Chest x-ray personally reviewed from 5/26: Cardiomegaly, satisfactory positioning of endotracheal tube, basilar atelectasis, vascular congestion improved from initial film Plan Has decreased PEEP to 5, will  decrease sedation and initiate spontaneous breathing trial I am hopeful he will be extubated today  PAD protocol RA SS goal 0, discontinue following extubation  Day #4 of 5 a azithromycin and #4 of 7 ceftriaxone  A.m. chest x-ray  VAP bundle while intubated   Cardiogenic shock with known CHFrEF and acute biventricular heart failure (20-25 %)  Atrial Flutter, sinus node dysfunction  aortic Valve Disease, and chronic PAH/right heart failure Left apical thrombus  Plan Holding diuresis, likely will resume on daily basis 5/28 Continue telemetry Continue aspirin and heparin Continue scheduled hydralazine He has been seen by electrophysiology, after extubation the plan will be to further evaluate rhythm with activity, looks like will eventually need pacemaker  CKD stage 3, looks like baseline creatinine about 1.8 He is now 2.1 L negative, has been receiving aggressive IV diuresis.  His BUN and creatinine have bumped today, suspect he is now euvolemic -Plan Hold diuresis today Renal dose medications Blood pressure control A.m. chemistry  DM2  Glycemic control is been excellent here  plan Continue current sliding scale  Constipation  Plan Start senna   Best practice:  Diet: NPO  Pain/Anxiety/Delirium protocol (if indicated): PRN fentanyl, PRN versed  VAP protocol (if indicated): yes DVT prophylaxis: SCD GI prophylaxis: protonix Glucose control: SSI  Mobility: bedrest Code Status: Full  Family Communication: pending  Disposition:   Critical care time:45 minutes.     Erick Colace ACNP-BC [place] Pager # [phone] OR # [phone] if no answer

## 2019-05-14 NOTE — Progress Notes (Signed)
ANTICOAGULATION CONSULT NOTE  Pharmacy Consult for heparin Indication: VTE   Patient Measurements: Height: 5\' 11"  (180.3 cm) Weight: 258 lb 2.5 oz (117.1 kg) IBW/kg (Calculated) : 75.3 Heparin Dosing Weight: 100kg  Vital Signs: Temp: 99.4 F (37.4 C) (05/27 0423) Temp Source: Oral (05/27 0423) BP: 137/96 (05/27 0802) Pulse Rate: 68 (05/27 0802)  Labs: Recent Labs    05/11/19 0843  05/11/19 1217  05/12/19 0943 05/13/19 0646 05/14/19 0400  HGB 17.3*   < >  --    < > 14.7 13.4 13.7  HCT 55.8*   < >  --    < > 44.3 41.9 43.2  PLT 253  --   --   --  170 172 180  APTT  --   --  30  --   --   --   --   LABPROT  --   --  17.4*  --   --   --   --   INR  --   --  1.4*  --   --   --   --   HEPARINUNFRC  --   --   --    < > 0.53 0.41 0.36  CREATININE 1.71*  --   --   --  2.15* 1.90* 2.03*  TROPONINI 0.14*  --   --   --   --   --   --    < > = values in this interval not displayed.     Medical History: Past Medical History:  Diagnosis Date  . Aortic valve disease    a. 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 Atrial flutter ([place])   . Benign hypertensive heart and renal disease   . Chronic combined systolic and diastolic CHF (congestive heart failure) ([place])   . Diabetes mellitus ([place])   . Essential hypertension 11/19/2014  . Insomnia   . Murmur   . Noncompliance   . Obesity   . Pulmonary hypertension (HCC)    Assessment: 62 yoM admitted with SOB requiring intubation. Labs reveal slightly elevated troponins and D-dimer >20. Baseline INR and aPTT normal, CBC wnl, no anticoagulation PTA.  Heparin level is therapeutic on 1500 units/hr. Hgb 13.4, plt wnl. 5/24 Duplex neg for DVTs. 5/24 ECHO showing small, fixed thrombus on apical wall of LV. Per nursing he is having some light blood in secretions, will continure to monitor  Goal of Therapy:  Heparin level 0.3-0.7 units/ml Monitor platelets by anticoagulation  protocol: Yes   Plan:  -Continue heparin infusion at 1500 units/hr -Daily heparin level and CBC, monitor for s/s bleeding -F/U oral anticoagulation plans when extubated  Harrietta Guardian, PharmD PGY1 Pharmacy Resident 05/14/2019    8:08 AM Please check AMION for all [place] numbers

## 2019-05-14 NOTE — Procedures (Signed)
Extubation Procedure Note  Patient Details:   Name: [name] DOB: [birth year] MRN: [id number]   Airway Documentation:    Vent end date: 05/14/19 Vent end time: 1120   Evaluation  O2 sats: stable throughout Complications: No apparent complications Patient did tolerate procedure well. Bilateral Breath Sounds: Diminished, Rhonchi   Yes   Pt was extubated and placed onto 5 L [place] without incident. Pt is stable at this time. RT will continue to monitor.   Ronaldo Miyamoto 05/14/2019, 11:26 AM

## 2019-05-15 ENCOUNTER — Inpatient Hospital Stay (HOSPITAL_COMMUNITY): Payer: Medicare Other

## 2019-05-15 LAB — MAGNESIUM: Magnesium: 2.2 mg/dL (ref 1.7–2.4)

## 2019-05-15 LAB — COMPREHENSIVE METABOLIC PANEL
ALT: 47 U/L — ABNORMAL HIGH (ref 0–44)
AST: 37 U/L (ref 15–41)
Albumin: 2.9 g/dL — ABNORMAL LOW (ref 3.5–5.0)
Alkaline Phosphatase: 101 U/L (ref 38–126)
Anion gap: 9 (ref 5–15)
BUN: 27 mg/dL — ABNORMAL HIGH (ref 6–20)
CO2: 32 mmol/L (ref 22–32)
Calcium: 9 mg/dL (ref 8.9–10.3)
Chloride: 106 mmol/L (ref 98–111)
Creatinine, Ser: 1.53 mg/dL — ABNORMAL HIGH (ref 0.61–1.24)
GFR calc Af Amer: 57 mL/min — ABNORMAL LOW (ref >60)
GFR calc non Af Amer: 49 mL/min — ABNORMAL LOW (ref >60)
Glucose, Bld: 102 mg/dL — ABNORMAL HIGH (ref 70–99)
Potassium: 3.6 mmol/L (ref 3.5–5.1)
Sodium: 147 mmol/L — ABNORMAL HIGH (ref 135–145)
Total Bilirubin: 1.3 mg/dL — ABNORMAL HIGH (ref 0.3–1.2)
Total Protein: 6.4 g/dL — ABNORMAL LOW (ref 6.5–8.1)

## 2019-05-15 LAB — CULTURE, RESPIRATORY W GRAM STAIN

## 2019-05-15 LAB — CBC
HCT: 39.5 % (ref 39.0–52.0)
Hemoglobin: 12.5 g/dL — ABNORMAL LOW (ref 13.0–17.0)
MCH: 27.1 pg (ref 26.0–34.0)
MCHC: 31.6 g/dL (ref 30.0–36.0)
MCV: 85.5 fL (ref 80.0–100.0)
Platelets: 187 10*3/uL (ref 150–400)
RBC: 4.62 MIL/uL (ref 4.22–5.81)
RDW: 15.2 % (ref 11.5–15.5)
WBC: 13.3 10*3/uL — ABNORMAL HIGH (ref 4.0–10.5)
nRBC: 0 % (ref 0.0–0.2)

## 2019-05-15 LAB — GLUCOSE, CAPILLARY
Glucose-Capillary: 118 mg/dL — ABNORMAL HIGH (ref 70–99)
Glucose-Capillary: 112 mg/dL — ABNORMAL HIGH (ref 70–99)
Glucose-Capillary: 119 mg/dL — ABNORMAL HIGH (ref 70–99)
Glucose-Capillary: 128 mg/dL — ABNORMAL HIGH (ref 70–99)
Glucose-Capillary: 143 mg/dL — ABNORMAL HIGH (ref 70–99)

## 2019-05-15 LAB — HEPARIN LEVEL (UNFRACTIONATED)
Heparin Unfractionated: 0.26 IU/mL — ABNORMAL LOW (ref 0.30–0.70)
Heparin Unfractionated: 0.32 IU/mL (ref 0.30–0.70)
Heparin Unfractionated: 0.28 IU/mL — ABNORMAL LOW (ref 0.30–0.70)

## 2019-05-15 LAB — PHOSPHORUS: Phosphorus: 3.6 mg/dL (ref 2.5–4.6)

## 2019-05-15 MED ORDER — SENNA 8.6 MG PO TABS
1.0000 | ORAL_TABLET | Freq: Two times a day (BID) | ORAL | Status: DC
  Administered 2019-05-15 – 2019-05-23 (×6): 8.6 mg via ORAL
  Filled 2019-05-15 (×16): qty 1

## 2019-05-15 MED ORDER — INSULIN ASPART 100 UNIT/ML ~~LOC~~ SOLN
0.0000 [IU] | Freq: Three times a day (TID) | SUBCUTANEOUS | Status: DC
  Administered 2019-05-15: 23:00:00 3 [IU] via SUBCUTANEOUS
  Administered 2019-05-16: 12:00:00 4 [IU] via SUBCUTANEOUS
  Administered 2019-05-16: 3 [IU] via SUBCUTANEOUS
  Administered 2019-05-17: 18:00:00 4 [IU] via SUBCUTANEOUS
  Administered 2019-05-17: 12:00:00 3 [IU] via SUBCUTANEOUS
  Administered 2019-05-17: 22:00:00 4 [IU] via SUBCUTANEOUS
  Administered 2019-05-18: 18:00:00 7 [IU] via SUBCUTANEOUS
  Administered 2019-05-18: 22:00:00 4 [IU] via SUBCUTANEOUS
  Administered 2019-05-19: 3 [IU] via SUBCUTANEOUS
  Administered 2019-05-19: 11 [IU] via SUBCUTANEOUS
  Administered 2019-05-19 (×2): 3 [IU] via SUBCUTANEOUS
  Administered 2019-05-20 (×2): 4 [IU] via SUBCUTANEOUS
  Administered 2019-05-20: 7 [IU] via SUBCUTANEOUS

## 2019-05-15 MED ORDER — SODIUM CHLORIDE 0.9 % IV SOLN
2.0000 g | Freq: Three times a day (TID) | INTRAVENOUS | Status: AC
  Administered 2019-05-15 – 2019-05-22 (×24): 2 g via INTRAVENOUS
  Filled 2019-05-15 (×26): qty 2

## 2019-05-15 MED ORDER — ENSURE ENLIVE PO LIQD
237.0000 mL | Freq: Two times a day (BID) | ORAL | Status: DC
  Administered 2019-05-15 – 2019-05-20 (×8): 237 mL via ORAL

## 2019-05-15 MED ORDER — PREDNISONE 20 MG PO TABS
40.0000 mg | ORAL_TABLET | Freq: Every day | ORAL | Status: DC
  Administered 2019-05-16 – 2019-05-22 (×7): 40 mg via ORAL
  Filled 2019-05-15 (×7): qty 2

## 2019-05-15 MED ORDER — PREDNISONE 20 MG PO TABS
40.0000 mg | ORAL_TABLET | ORAL | Status: AC
  Administered 2019-05-15: 17:00:00 40 mg via ORAL
  Filled 2019-05-15: qty 2

## 2019-05-15 MED ORDER — WARFARIN SODIUM 5 MG PO TABS
5.0000 mg | ORAL_TABLET | Freq: Once | ORAL | Status: AC
  Administered 2019-05-15: 17:00:00 5 mg via ORAL

## 2019-05-15 MED ORDER — WARFARIN - PHARMACIST DOSING INPATIENT
Freq: Every day | Status: DC
  Administered 2019-05-15 – 2019-05-16 (×2)

## 2019-05-15 NOTE — Progress Notes (Addendum)
ANTICOAGULATION CONSULT NOTE  Pharmacy Consult for Heparin + Warfarin Indication: LV apical thrombus + Afib   Patient Measurements: Height: 5\' 11"  (180.3 cm) Weight: 251 lb 5.2 oz (114 kg) IBW/kg (Calculated) : 75.3 Heparin Dosing Weight: 100kg  Vital Signs: Temp: 98.2 F (36.8 C) (05/28 1058) Temp Source: Oral (05/28 1058) BP: 137/81 (05/28 1224) Pulse Rate: 69 (05/28 1224)  Labs: Recent Labs    05/13/19 0646 05/14/19 0400 05/15/19 0357 05/15/19 1446  HGB 13.4 13.7 12.5*  --   HCT 41.9 43.2 39.5  --   PLT 172 180 187  --   HEPARINUNFRC 0.41 0.36 0.26* 0.32  CREATININE 1.90* 2.03* 1.53*  --      Medical History: Past Medical History:  Diagnosis Date  . Aortic valve disease    a. 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 Atrial flutter ([place])   . Benign hypertensive heart and renal disease   . Chronic combined systolic and diastolic CHF (congestive heart failure) ([place])   . Diabetes mellitus ([place])   . Essential hypertension 11/19/2014  . Insomnia   . Murmur   . Noncompliance   . Obesity   . Pulmonary hypertension (HCC)    Assessment: 70 yoM admitted with SOB requiring intubation. Labs reveal slightly elevated troponins and D-dimer >20. Baseline INR and aPTT normal, CBC wnl, no anticoagulation PTA. 5/24 ECHO showing small, fixed thrombus on apical wall of LV. Pharmacy consulted to dose Heparin for anticoagulation.   Heparin level this afternoon is therapeutic after a rate increase earlier today (HL 0.32 << 0.26, goal of 0.3-0.7). No bleeding or problems with infusion noted at this time.   Per cardiology, to start Warfarin this evening.   Goal of Therapy:  Heparin level 0.3-0.7 units/ml Monitor platelets by anticoagulation protocol: Yes   Plan:  - Continue Heparin at 1700 units/hr (17 ml/hr) - Warfarin 5 mg x 1 at 1800 today - Daily HL, PT/INR - Will continue to monitor for any signs/symptoms  of bleeding and will follow up with heparin level in 6 hours to confirm therapeutic  Thank you for allowing pharmacy to be a part of this patient's care.  Alycia Rossetti, PharmD, BCPS Clinical Pharmacist Clinical phone for 05/15/2019: [phone] 05/15/2019 3:33 PM   **Pharmacist phone directory can now be found on [email or web address] ([email or web address]).  Listed under [place].

## 2019-05-15 NOTE — Progress Notes (Addendum)
ANTICOAGULATION CONSULT NOTE  Pharmacy Consult for heparin Indication: VTE   Patient Measurements: Height: 5\' 11"  (180.3 cm) Weight: 246 lb 0.5 oz (111.6 kg) IBW/kg (Calculated) : 75.3 Heparin Dosing Weight: 100kg  Vital Signs: Temp: 99.4 F (37.4 C) (05/27 2300) Temp Source: Oral (05/27 2300) BP: 151/90 (05/28 0700) Pulse Rate: 80 (05/28 0700)  Labs: Recent Labs    05/12/19 0943 05/13/19 0646 05/14/19 0400 05/15/19 0357  HGB 14.7 13.4 13.7 12.5*  HCT 44.3 41.9 43.2 39.5  PLT 170 172 180 187  HEPARINUNFRC 0.53 0.41 0.36 0.26*  CREATININE 2.15* 1.90* 2.03*  --      Medical History: Past Medical History:  Diagnosis Date  . Aortic valve disease    a. 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 Atrial flutter ([place])   . Benign hypertensive heart and renal disease   . Chronic combined systolic and diastolic CHF (congestive heart failure) ([place])   . Diabetes mellitus ([place])   . Essential hypertension 11/19/2014  . Insomnia   . Murmur   . Noncompliance   . Obesity   . Pulmonary hypertension (HCC)    Assessment: 83 yoM admitted with SOB requiring intubation. Labs reveal slightly elevated troponins and D-dimer >20. Baseline INR and aPTT normal, CBC wnl, no anticoagulation PTA.  Heparin level is subtherapeutic on 1500 units/hr. Hgb 12.5, plt stable wnl. 5/24 Duplex neg for DVTs. 5/24 ECHO showing small, fixed thrombus on apical wall of LV. No bleeding or problems with infusion per nursing.  Goal of Therapy:  Heparin level 0.3-0.7 units/ml Monitor platelets by anticoagulation protocol: Yes   Plan:  -Increase heparin infusion to 1700 units/hr -Daily heparin level and CBC, monitor for s/s bleeding -F/U transition to oral anticoagulation (DOAC vs Warfarin) now extubated  Harrietta Guardian, PharmD PGY1 Pharmacy Resident 05/15/2019    8:02 AM Please check AMION for all [facility] numbers

## 2019-05-15 NOTE — Evaluation (Signed)
Physical Therapy Evaluation Patient Details Name: [name] MRN: [id number] DOB: 1961/01/09 Today's Date: 05/15/2019   History of Present Illness  Pt adm with acute respiratory failure due to decompensated heart failure. Intubated 5/24 - 5/28. PMH - CHF, AVR, HTN, obesity, CKD  Clinical Impression  Pt presents to PT with limited mobility due to weakness and poor activity tolerance. At this level currently recommend ST-SNF. Pt known to me from previous admission and doubt he will agree to SNF. Would maximize HH if he again refuses. Of note last admission he did progress very rapidly with mobility. Also noted last admission were cognitive deficits that did not appear to be new.     Follow Up Recommendations SNF    Equipment Recommendations  Other (comment)(To be determined)    Recommendations for Other Services       Precautions / Restrictions Precautions Precautions: Fall Restrictions Weight Bearing Restrictions: No      Mobility  Bed Mobility Overal bed mobility: Needs Assistance Bed Mobility: Supine to Sit     Supine to sit: Min assist;HOB elevated     General bed mobility comments: Assist to pull up on therapist hand to elevate trunk into sitting  Transfers Overall transfer level: Needs assistance Equipment used: Rolling walker (2 wheeled) Transfers: Sit to/from Stand Sit to Stand: Mod assist;Min assist;+2 physical assistance         General transfer comment: Assist to bring hips up and for balance. On initial stand from bed pt required +2 mod assist and incr time. From recliner pt able to come to standing with +2 min assist. Verbal cues for hand placement. Pt using LUE for transfer and keeping RUE non weight bearing due to pain.  Ambulation/Gait Ambulation/Gait assistance: Min assist;+2 safety/equipment Gait Distance (Feet): 5 Feet Assistive device: Rolling walker (2 wheeled) Gait Pattern/deviations: Step-to pattern;Decreased step length - right;Decreased  step length - left;Shuffle;Wide base of support;Trunk flexed Gait velocity: decr Gait velocity interpretation: <1.31 ft/sec, indicative of household ambulator General Gait Details: Assist for balance and support and to guide RW. Pt using LUE to push walker and just resting RUE on walker due to pain in rt wrist.  Pt amb on 4L of O2.  Stairs            Wheelchair Mobility    Modified Rankin (Stroke Patients Only)       Balance Overall balance assessment: Needs assistance Sitting-balance support: No upper extremity supported;Feet supported Sitting balance-Leahy Scale: Good     Standing balance support: Single extremity supported Standing balance-Leahy Scale: Poor Standing balance comment: walker and min assist for static standing                             Pertinent Vitals/Pain Pain Assessment: Faces Faces Pain Scale: Hurts even more Pain Location: rt wrist  Pain Descriptors / Indicators: Grimacing;Guarding;Sore Pain Intervention(s): Limited activity within patient's tolerance;Monitored during session;Repositioned    Home Living Family/patient expects to be discharged to:: Private residence Living Arrangements: Alone Available Help at Discharge: Friend(s) Type of Home: Apartment Home Access: Level entry     Home Layout: One level Home Equipment: None      Prior Function Level of Independence: Independent               Hand Dominance   Dominant Hand: Right    Extremity/Trunk Assessment   Upper Extremity Assessment Upper Extremity Assessment: Defer to OT evaluation;RUE deficits/detail RUE Deficits / Details:  rt wrist and hand with edema limiting use     Lower Extremity Assessment Lower Extremity Assessment: Generalized weakness       Communication      Cognition Arousal/Alertness: Awake/alert Behavior During Therapy: WFL for tasks assessed/performed Overall Cognitive Status: No family/caregiver present to determine baseline  cognitive functioning                                 General Comments: Suspect pt with baseline cognitive deficits which were also noted during hospitalization in Dec 2019. Pt acting like he knows the rehab tech with me when he has never met her before. Making questionable statements about how he hurt his rt wrist.       General Comments      Exercises     Assessment/Plan    PT Assessment Patient needs continued PT services  PT Problem List Decreased strength;Decreased activity tolerance;Decreased balance;Decreased mobility;Decreased knowledge of use of DME       PT Treatment Interventions DME instruction;Gait training;Functional mobility training;Therapeutic activities;Therapeutic exercise;Balance training;Patient/family education    PT Goals (Current goals can be found in the Care Plan section)  Acute Rehab PT Goals Patient Stated Goal: return home PT Goal Formulation: With patient Time For Goal Achievement: 05/29/19 Potential to Achieve Goals: Good    Frequency Min 3X/week   Barriers to discharge Decreased caregiver support lives alone    Co-evaluation               AM-PAC PT "6 Clicks" Mobility  Outcome Measure Help needed turning from your back to your side while in a flat bed without using bedrails?: A Little Help needed moving from lying on your back to sitting on the side of a flat bed without using bedrails?: A Little Help needed moving to and from a bed to a chair (including a wheelchair)?: A Lot Help needed standing up from a chair using your arms (e.g., wheelchair or bedside chair)?: A Lot Help needed to walk in hospital room?: A Little Help needed climbing 3-5 steps with a railing? : Total 6 Click Score: 14    End of Session Equipment Utilized During Treatment: Gait belt;Oxygen Activity Tolerance: Patient tolerated treatment well Patient left: in chair;with call bell/phone within reach;with chair alarm set Nurse Communication:  Mobility status PT Visit Diagnosis: Unsteadiness on feet (R26.81);Other abnormalities of gait and mobility (R26.89);Muscle weakness (generalized) (M62.81)    Time: 5379-4327 PT Time Calculation (min) (ACUTE ONLY): 24 min   Charges:   PT Evaluation $PT Eval Moderate Complexity: 1 Mod PT Treatments $Gait Training: 8-22 mins        [place] Pager [phone] Office [place] 05/15/2019, 2:56 PM

## 2019-05-15 NOTE — Progress Notes (Signed)
[facility] for Heparin Indication: LV apical thrombus + Afib   Patient Measurements: Height: 5\' 11"  (180.3 cm) Weight: 251 lb 5.2 oz (114 kg) IBW/kg (Calculated) : 75.3 Heparin Dosing Weight: 100kg  Vital Signs: Temp: 99.7 F (37.6 C) (05/28 2259) Temp Source: Oral (05/28 2259) BP: 142/85 (05/28 2259) Pulse Rate: 82 (05/28 2259)  Labs: Recent Labs    05/13/19 0646 05/14/19 0400 05/15/19 0357 05/15/19 1446 05/15/19 2213  HGB 13.4 13.7 12.5*  --   --   HCT 41.9 43.2 39.5  --   --   PLT 172 180 187  --   --   HEPARINUNFRC 0.41 0.36 0.26* 0.32 0.28*  CREATININE 1.90* 2.03* 1.53*  --   --      Medical History: Past Medical History:  Diagnosis Date  . Aortic valve disease    a. 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 Atrial flutter ([place])   . Benign hypertensive heart and renal disease   . Chronic combined systolic and diastolic CHF (congestive heart failure) ([place])   . Diabetes mellitus ([place])   . Essential hypertension 11/19/2014  . Insomnia   . Murmur   . Noncompliance   . Obesity   . Pulmonary hypertension (HCC)    Assessment: 8 yoM admitted with SOB requiring intubation. Labs reveal slightly elevated troponins and D-dimer >20. Baseline INR and aPTT normal, CBC wnl, no anticoagulation PTA. 5/24 ECHO showing small, fixed thrombus on apical wall of LV. Pharmacy consulted to dose Heparin for anticoagulation.   Heparin level 0.28 units/ml  Goal of Therapy:  Heparin level 0.3-0.7 units/ml Monitor platelets by anticoagulation protocol: Yes   Plan:  - Increase Heparin to 1800 units/hr  - Daily HL, PT/INR - Will continue to monitor for any bleeding complications  Thanks for allowing pharmacy to be a part of this patient's care.  Excell Seltzer, PharmD Clinical Pharmacist

## 2019-05-15 NOTE — Progress Notes (Signed)
Episodes of VT noted. No sig electrolyte issues  Plan Cont current rx [place] ACNP-BC [place] Pager # [phone] OR # [phone] if no answer

## 2019-05-15 NOTE — Progress Notes (Signed)
Nutrition Follow-up  RD working remotely.  DOCUMENTATION CODES:   Obesity unspecified  INTERVENTION:   Ensure Enlive po BID, each supplement provides 350 kcal and 20 grams of protein   NUTRITION DIAGNOSIS:   Inadequate oral intake related to acute illness as evidenced by NPO status.  Being addressed as diet advanced, supplements  GOAL:   Patient will meet greater than or equal to 90% of their needs  Progressing  MONITOR:   PO intake, Supplement acceptance, Labs, Weight trends  REASON FOR ASSESSMENT:   Ventilator    ASSESSMENT:   58 yo male admitted with acute respiratory failure requiring intubation, cardiogenic shock with known heart failure EF 20-25. PMH includes CHF, HTN, CKD III, DM  5/24 Intubated 5/26 Developed acute respiratory distress likely related to mucous plugging, possible flash pulmonary edema, TF started 5/27 Extubated, diet advanced to Heart Healthy  Recorded po intake 50% at dinner last night  Weight trending down; net negative 2.6 L per I/O flow sheet  Labs: reviewed  Meds: reviewed    Diet Order:   Diet Order            Diet Heart Room service appropriate? Yes; Fluid consistency: Thin; Fluid restriction: 1800 mL Fluid  Diet effective now              EDUCATION NEEDS:   Not appropriate for education at this time  Skin:  Skin Assessment: Reviewed RN Assessment  Last BM:  no documented BM  Height:   Ht Readings from Last 1 Encounters:  05/11/19 5\' 11"  (1.803 m)    Weight:   Wt Readings from Last 1 Encounters:  05/15/19 111.6 kg    Ideal Body Weight:  78.2 kg  BMI:  Body mass index is 34.31 kg/m.  Estimated Nutritional Needs:   Kcal:  2200-2400 kcals   Protein:  110-120 g   Fluid:  >/= 2 L   Kerman Passey MS, RD, LDN, CNSC [phone] Pager  [phone] Weekend/On-Call Pager

## 2019-05-15 NOTE — Progress Notes (Signed)
Pharmacy Antibiotic Note  [name] is a 58 y.o. male admitted on 05/11/2019 with pneumonia.  Pharmacy has been consulted for cefepime dosing, was on ceftriaxone and azithromycin but escalated coverage today due to pseudomonas growing in respiratory culture.   WBC 13.3, Tmax/24h 100.4, CrCL ~ 66 , scr 1.53  Plan: Cefepime 2g q8h F/u Cxs, clinical status, renal function, LOT/de-escalation,   Height: 5\' 11"  (180.3 cm) Weight: 246 lb 0.5 oz (111.6 kg) IBW/kg (Calculated) : 75.3  Temp (24hrs), Avg:99.6 F (37.6 C), Min:98.4 F (36.9 C), Max:100.4 F (38 C)  Recent Labs  Lab 05/11/19 0843 05/11/19 1217 05/12/19 0943 05/13/19 0646 05/14/19 0400 05/15/19 0357  WBC 14.4*  --  12.7* 12.6* 9.5 13.3*  CREATININE 1.71*  --  2.15* 1.90* 2.03*  --   LATICACIDVEN  --  2.6*  --   --   --   --     Estimated Creatinine Clearance: 50.4 mL/min (A) (by C-G formula based on SCr of 2.03 mg/dL (H)).    No Known Allergies  Antimicrobials this admission: Cefepime 5/28 >> CTX 5/24 >> 5/28 Azithro 5/24 >> 5/28  Dose adjustments this admission: n/a  Microbiology results: 5/24 COVID >> neg 5/24 Resp panel >> neg 5/24 Resp Cx >> PSA (pan sens) 5/24 BCx >> ngtd 5/24 MRSA pcr postive  Thank you for allowing pharmacy to be a part of this patient's care.  Harrietta Guardian, PharmD PGY1 Pharmacy Resident 05/15/2019    9:42 AM Please check AMION for all [facility] numbers

## 2019-05-15 NOTE — Progress Notes (Addendum)
Patient with 5 beats of VT, after 10 minutes he had 9 beats. Paged Critical Care MD and notified him. No new orders, observe for now. Patient asymptomatic.

## 2019-05-15 NOTE — Progress Notes (Addendum)
Advanced Heart Failure Rounding Note   Subjective:    Now out on the floor. Says breathing much better but c/o severe pain in R wrist. Denies injury. No fevers, chills, CP or SOB. Worked with PT. On heparin. No bleeding.   Objective:   Weight Range:  Vital Signs:   Temp:  [98.2 F (36.8 C)-100.4 F (38 C)] 98.2 F (36.8 C) (05/28 1058) Pulse Rate:  [65-89] 69 (05/28 1638) Resp:  [13-31] 19 (05/28 1058) BP: (114-198)/(65-169) 155/97 (05/28 1638) SpO2:  [92 %-100 %] 100 % (05/28 1638) FiO2 (%):  [40 %] 40 % (05/28 0000) Weight:  [111.6 kg-114 kg] 114 kg (05/28 1200) Last BM Date: (PTA)  Weight change: Filed Weights   05/14/19 0500 05/15/19 0500 05/15/19 1200  Weight: 117.1 kg 111.6 kg 114 kg    Intake/Output:   Intake/Output Summary (Last 24 hours) at 05/15/2019 1643 Last data filed at 05/15/2019 1621 Gross per 24 hour  Intake 655 ml  Output 1560 ml  Net -905 ml     Physical Exam: General:  Sitting in chair No resp difficulty HEENT: normal Neck: supple. no JVD. Carotids 2+ bilat; no bruits. No lymphadenopathy or thryomegaly appreciated. Cor: PMI nondisplaced. Irregular rate & rhythm. No rubs, gallops or murmurs. Lungs: clear Abdomen: soft, nontender, nondistended. No hepatosplenomegaly. No bruits or masses. Good bowel sounds. Extremities: no cyanosis, clubbing, rash, edema right wrist swollen and warm  painfulr ROM Neuro: alert & orientedx3, cranial nerves grossly intact. moves all 4 extremities w/o difficulty. Affect pleasant   Telemetry: AF 60-70 + PVCs Personally reviewed    Labs: Basic Metabolic Panel: Recent Labs  Lab 05/11/19 0843  05/12/19 0423 05/12/19 0943 05/13/19 0646 05/13/19 1649 05/14/19 0400 05/15/19 0357  NA 142   < > 141 141 143  --  145 147*  K 3.9   < > 4.4 4.3 4.2  --  4.2 3.6  CL 101  --   --  107 108  --  105 106  CO2 22  --   --  18* 23  --  27 32  GLUCOSE 272*  --   --  137* 123*  --  123* 102*  BUN 10  --   --  26* 25*   --  41* 27*  CREATININE 1.71*  --   --  2.15* 1.90*  --  2.03* 1.53*  CALCIUM 9.6  --   --  8.8* 9.0  --  9.2 9.0  MG  --   --   --  1.7 2.0 2.3 2.3 2.2  PHOS  --   --   --  3.0 3.3 3.9 4.6 3.6   < > = values in this interval not displayed.    Liver Function Tests: Recent Labs  Lab 05/11/19 0843 05/14/19 0400 05/15/19 0357  AST 26 36 37  ALT 23 47* 47*  ALKPHOS 105 80 101  BILITOT 1.7* 1.0 1.3*  PROT 8.4* 6.5 6.4*  ALBUMIN 4.1 3.1* 2.9*   No results for input(s): LIPASE, AMYLASE in the last 168 hours. No results for input(s): AMMONIA in the last 168 hours.  CBC: Recent Labs  Lab 05/11/19 0843  05/12/19 0423 05/12/19 0943 05/13/19 0646 05/14/19 0400 05/15/19 0357  WBC 14.4*  --   --  12.7* 12.6* 9.5 13.3*  NEUTROABS 11.7*  --   --   --   --   --   --   HGB 17.3*   < > 14.6 14.7  13.4 13.7 12.5*  HCT 55.8*   < > 43.0 44.3 41.9 43.2 39.5  MCV 86.9  --   --  83.0 85.5 85.9 85.5  PLT 253  --   --  170 172 180 187   < > = values in this interval not displayed.    Cardiac Enzymes: Recent Labs  Lab 05/11/19 0843  TROPONINI 0.14*    BNP: BNP (last 3 results) Recent Labs    11/29/18 0808 05/11/19 0844  BNP 1,232.9* 1,980.2*    ProBNP (last 3 results) No results for input(s): PROBNP in the last 8760 hours.    Other results:  Imaging: Dg Chest Port 1 View  Result Date: 05/15/2019 CLINICAL DATA:  Acute respiratory failure. EXAM: PORTABLE CHEST 1 VIEW COMPARISON:  05/14/2019. FINDINGS: Interim extubation and removal of NG tube. Prior cardiac valve replacement. Surgical clips over the upper chest. Stable cardiomegaly. Stable left base atelectasis/infiltrate. Small left pleural effusion cannot be excluded. No pneumothorax. IMPRESSION: 1.  Interim extubation and removal of NG tube. 2.  Prior cardiac valve replacement.  Stable cardiomegaly. 3. Stable left base atelectasis/infiltrate. Small left pleural effusion cannot be excluded. Electronically Signed   By: Marcello Moores   Register   On: 05/15/2019 07:11   Dg Chest Port 1 View  Result Date: 05/14/2019 CLINICAL DATA:  Shortness of breath. EXAM: PORTABLE CHEST 1 VIEW COMPARISON:  Chest x-ray from yesterday. FINDINGS: Unchanged endotracheal tube. Enteric tube entering the stomach with the tip below the field of view. Stable cardiomegaly status post AVR. Normal pulmonary vascularity. Unchanged left basilar atelectasis/infiltrate. Unchanged small left pleural effusion. No pneumothorax. No acute osseous abnormality. IMPRESSION: 1. Unchanged left basilar atelectasis and/or infiltrate with small left pleural effusion. Electronically Signed   By: Titus Dubin M.D.   On: 05/14/2019 09:29     Medications:     Scheduled Medications: . Chlorhexidine Gluconate Cloth  6 each Topical Q0600  . feeding supplement (ENSURE ENLIVE)  237 mL Oral BID BM  . insulin aspart  0-20 Units Subcutaneous TID AC & HS  . isosorbide-hydrALAZINE  1 tablet Oral TID  . mupirocin ointment  1 application Nasal BID  . [START ON 05/16/2019] predniSONE  40 mg Oral Q breakfast  . senna  1 tablet Oral BID  . spironolactone  12.5 mg Oral Daily    Infusions: . ceFEPime (MAXIPIME) IV 2 g (05/15/19 1408)  . heparin 1,700 Units/hr (05/15/19 1640)    PRN Medications: acetaminophen, albuterol, sodium chloride flush   Assessment/plan:   1. Acute systolic HF/cardiogenic shock with biventricular HF - Echo 05/11/19 EF 20-25% with moderately decreased RV function  - Echo 12/19 EF 50% - Due to NICM (coronaries normal in 2016). Suspect due to probable tachy-induced CM (Was in AFL in 140s on admit) - Volume status looks good.  - Continue Bidil and spiro. - Will add back po diuretic tomorrow - No b-blocker with sinus and AV node dysfunction  2. PAFL/ AFIB - suspect this may be cause of CM  - however previous ECGs also suggest possible AV dissociation with accelerated junctional outpacing his sinus - Now in AF with rate control - I have asked EP to  see to question whether or not he may benefit from CRTpacing to help improve his CM and allow [place] to use AA as needed (versus AVN ablation). Will d/w them again tomorrow - Continue heparin. Start warfarin  3. Acute hypoxic respiratory failure - likely due to pulmonary edema. Improved with diuresis. Also treated for PNA -  extubated 5/27  4. AK on CKD 3 - baseline creatinine 1.8. creatinine 1.5 today  5. 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 LV apical thrombus - continue heparin -> warfarin   8. Morbid obesity - will need weight loss   9. Hypomag - supped   10. R wrist pain - likely acute gout - start prednisone.  - check uric acid   11. Hypokalemia - supp  Length of Stay: 4   Glori Bickers MD 05/15/2019, 4:43 PM  Advanced Heart Failure Team Pager [phone] (M-F; 7a - 4p)  Please contact [facility] for night-coverage after hours (4p -7a ) and weekends on [email or web address]

## 2019-05-15 NOTE — Progress Notes (Addendum)
NAME:  [name], MRN:  [id number], DOB:  05-Oct-1961, LOS: 4 ADMISSION DATE:  05/11/2019, CONSULTATION DATE:  05/11/19 REFERRING MD:  Long - ED, CHIEF COMPLAINT:  Dyspnea   Brief History   58 yo M with progressive SOB and chest pain, intubated in ED for acute decompensated HF   Past Medical History  sCHF HTN A flutter CKD Bicuspid aortic valve s/p AVR  Significant Hospital Events   5/24 intubated in ED, admit to ICU  5/25: Fully awake.  Chest x-ray improving.  Hemodynamically stable on inotropic support.  Weaning PEEP and FiO2 remains positive volume status 5/25 through 5/26: Weaning PEEP and FiO2 support.  Successfully weaned down to 50% FiO2 and PEEP down to 8 from initial starting point of 18.  Over the evening hours of 5/23 into 5/24 early a.m. he became progressively hypertensive.  Dobutamine was stopped overnight.  Hypertension persisted.  At approximately 7 AM he developed an acute episode of hypoxia, shortness of breath, and associated agitation and restlessness.  A CODE BLUE was called, he never lost pulses, however was hypoxic.  He was bag lavaged, and then manually assisted ventilation.  Respiratory therapy did remove a fairly large mucous plug, however it is difficult to say at the time if this was the culprit.  After stabilized he was placed on propofol infusion to reevaluate pulmonary situation.  Hydralazine added for afterload reduction, dobutamine left off.  5/26 through 5/27: No issues following initial morning event on five 2/26.  Sedated over the course of the day.  Added hydralazine for blood pressure management.  On a.m. labs on the 27th creatinine bump so further diuretics held.  Initiating spontaneous breathing trial on the 27th with PEEP titrated down to 5.  He was successfully extubated on the 27th 5/28: Continues to improve.  Grew Pseudomonas in sputum, antibiotics switched to Maxipime.  Ready for transfer out of the intensive care, cardiology assisting with  titration of cardiac regimen and diuretics.  Continues on heparin for now for his apical thrombus, deferring decision about DOAC versus warfarin to cardiology Consults:  PCCM  Procedures:  5/24 ETT >>> 5/28  Significant Diagnostic Tests:  Echocardiogram 5/24:Small fixed thrombus in the apical wall of the left ventricle.  EF is severely reduced down to 20 to 25% cavity moderately dilated the right ventricle systolic pressure reduced right atrial size dilated left atrial dilated bioprosthetic valve present Micro Data:  5/24 Sars CoV -2 > negative  Sputum culture 5/24: Pseudomonas Antimicrobials:  Azithromycin 5/24>> 5/28 Ceftriaxone  5/24 >> 5/28 Cefepime 5/28>>>  Interim history/subjective:  No distress breathing easily.  Objective   Blood pressure (Abnormal) 156/84, pulse 87, temperature 98.4 F (36.9 C), temperature source Oral, resp. rate (Abnormal) 23, height 5\' 11"  (1.803 m), weight 111.6 kg, SpO2 94 %.    Vent Mode: PSV;CPAP FiO2 (%):  [40 %] 40 % PEEP:  [5 cmH20] 5 cmH20 Pressure Support:  [12 cmH20] 12 cmH20   Intake/Output Summary (Last 24 hours) at 05/15/2019 0858 Last data filed at 05/15/2019 0800 Gross per 24 hour  Intake 1417.39 ml  Output 2010 ml  Net -592.61 ml   Filed Weights   05/13/19 0500 05/14/19 0500 05/15/19 0500  Weight: 116.6 kg 117.1 kg 111.6 kg    Examination: General: This is a well-nourished 58 year old male patient currently resting in bed he is in no acute distress this morning talking in full sentences denying discomfort or dyspnea HEENT normocephalic atraumatic no jugular venous distention appreciated Pulmonary: Clear  to auscultation diminished in the bases no accessory use Cardiac: Regular irregular without audible murmur this morning Abdomen: Soft not tender no organomegaly positive bowel sounds tolerating tube feeds Extremities: Brisk capillary refill no edema strong pulses Neuro: Awake oriented no focal deficits   Resolved Hospital  Problem list   Acute hypoxic respiratory failure  Assessment & Plan:   Acute Respiratory failure requiring intubation due to CHF exacerbation with flash pulmonary edema and Pseudomonas pneumonia  portable chest x-ray personally reviewed: This demonstrates improved aeration bilaterally Surprisingly grew fairly pansensitive Pseudomonas in sputum Successfully extubated 5/27 Plan Wean oxygen  Mobilize  Discontinuing ceftriaxone, will need at least 8 days of antimicrobial therapy for Pseudomonas, will start cefepime for now  Pulse oximetry  We will need to decide on home diuretic regimen   Cardiogenic shock with known CHFrEF and acute biventricular heart failure (20-25 %)  Atrial Flutter, sinus node dysfunction  aortic Valve Disease, and chronic PAH/right heart failure Left apical thrombus  Plan Continue telemetry  Continue heparin and aspirin  Continuing BiDil and aldactone Will defer decision about DOAC versus warfarin to cardiology Cardiology feels he will eventually need pacemaker  CKD stage 3, looks like baseline creatinine about 1.8 He is now 2.1 L negative, has been receiving aggressive IV diuresis.  His BUN and creatinine have bumped today, suspect he is now euvolemic -Plan Continue to hold diuresis for now awaiting chemistry A.m. chemistry again in a.m. 5/29 Need to determine baseline diuretic regimen  DM2  Glycemic control is been excellent here  plan Sliding scale insulin  Constipation  Plan Cont senna  Best practice:  Diet: NPO  Pain/Anxiety/Delirium protocol (if indicated):: Stopped on 5/27 after extubation VAP protocol (if indicated): yes DVT prophylaxis: SCD, has been receiving IV heparin GI prophylaxis: protonix Glucose control: SSI  Mobility: bedrest: Out of bed initiated on 5/27 physical therapy ordered on 5/28 Code Status: Full  Family Communication: pending  Disposition: He looks excellent.  He is awake, oriented, tolerated extubation well and we  are weaning oxygen.  Surprisingly he did grow Pseudomonas in his sputum, I feel compelled to treat this given his underlying cardiomyopathy.  We will start him on Maxipime for this.  He is ready for transfer out of the intensive care.  His barriers to discharge are as follows: First we need to determine his diuretic regimen for home next still need to titrate and determine his optimal cardiac regimen and timing if indeed it is felt pacemaker will be needed additionally we need to treat his Pseudomonas pneumonia, for now we will start him on Maxipime.  He also remains quite debilitated after his acute illness, he will require physical therapy assistance     Erick Colace ACNP-BC [facility] Pager # [phone] OR # [phone] if no answer  --------------------------------------------------------------------------------------------------  Attending note: I have seen and examined the patient. History, labs and imaging reviewed.  Stable overnight.  No acute events Talking on the phone  Blood pressure (!) 162/85, pulse 73, temperature 98.4 F (36.9 C), temperature source Oral, resp. rate (!) 23, height 5\' 11"  (1.803 m), weight 111.6 kg, SpO2 96 %. Gen:      No acute distress HEENT:  EOMI, sclera anicteric Neck:     No masses; no thyromegaly Lungs:    Clear to auscultation bilaterally; normal respiratory effort CV:         Regular rate and rhythm; no murmurs Abd:      + bowel sounds; soft, non-tender; no palpable  masses, no distension Ext:    No edema; adequate peripheral perfusion Skin:      Warm and dry; no rash Neuro: alert and oriented x 3 Psych: normal mood and affect   Labs reviewed, significant for Sodium 145, potassium 4.2, BUN/creatinine 41/2.03 WBC 13.3, platelets 187  Imaging Chest x-ray 5/28- stable cardiomegaly, left base atelectasis/infiltrate.  I have reviewed the chest personally.  Assessment/plan: 58 year old with acute respiratory failure, CHF  exacerbation, Pseudomonas pneumonia  Respiratory failure Stable post extubation.  Wean oxygen Continue cefepime for Pseudomonas  Cardiogenic shock, heart failure LV thrombus Management per cardiology Anticoagulation.   Stable for transfer out of ICU to [facility].  PCCM will sign off  Marshell Garfinkel MD [facility] 05/15/2019, 9:47 AM

## 2019-05-15 NOTE — Progress Notes (Signed)
Patient is concerned that his right wrist is fractured,he did hit it while in ICU. Asking for X ray. X ray ordered.

## 2019-05-16 DIAGNOSIS — J151 Pneumonia due to Pseudomonas: Secondary | ICD-10-CM

## 2019-05-16 HISTORY — DX: Pneumonia due to Pseudomonas: J15.1

## 2019-05-16 LAB — GLUCOSE, CAPILLARY
Glucose-Capillary: 124 mg/dL — ABNORMAL HIGH (ref 70–99)
Glucose-Capillary: 162 mg/dL — ABNORMAL HIGH (ref 70–99)
Glucose-Capillary: 140 mg/dL — ABNORMAL HIGH (ref 70–99)
Glucose-Capillary: 166 mg/dL — ABNORMAL HIGH (ref 70–99)

## 2019-05-16 LAB — HEPARIN LEVEL (UNFRACTIONATED)
Heparin Unfractionated: 0.19 IU/mL — ABNORMAL LOW (ref 0.30–0.70)
Heparin Unfractionated: 0.55 IU/mL (ref 0.30–0.70)

## 2019-05-16 LAB — CULTURE, BLOOD (ROUTINE X 2)
Culture: NO GROWTH
Culture: NO GROWTH
Special Requests: ADEQUATE

## 2019-05-16 LAB — BASIC METABOLIC PANEL
Anion gap: 15 (ref 5–15)
BUN: 26 mg/dL — ABNORMAL HIGH (ref 6–20)
CO2: 22 mmol/L (ref 22–32)
Calcium: 9.4 mg/dL (ref 8.9–10.3)
Chloride: 104 mmol/L (ref 98–111)
Creatinine, Ser: 1.38 mg/dL — ABNORMAL HIGH (ref 0.61–1.24)
GFR calc Af Amer: >60 mL/min (ref >60)
GFR calc non Af Amer: 56 mL/min — ABNORMAL LOW (ref >60)
Glucose, Bld: 141 mg/dL — ABNORMAL HIGH (ref 70–99)
Potassium: 5.2 mmol/L — ABNORMAL HIGH (ref 3.5–5.1)
Sodium: 141 mmol/L (ref 135–145)

## 2019-05-16 LAB — PROTIME-INR
INR: 1.3 — ABNORMAL HIGH (ref 0.8–1.2)
Prothrombin Time: 16.2 seconds — ABNORMAL HIGH (ref 11.4–15.2)

## 2019-05-16 LAB — URIC ACID: Uric Acid, Serum: 7.3 mg/dL (ref 3.7–8.6)

## 2019-05-16 MED ORDER — FUROSEMIDE 40 MG PO TABS
40.0000 mg | ORAL_TABLET | Freq: Two times a day (BID) | ORAL | Status: DC
  Administered 2019-05-16 – 2019-05-23 (×13): 40 mg via ORAL
  Filled 2019-05-16 (×14): qty 1

## 2019-05-16 MED ORDER — HEPARIN BOLUS VIA INFUSION
2000.0000 [IU] | Freq: Once | INTRAVENOUS | Status: AC
  Administered 2019-05-16: 07:00:00 2000 [IU] via INTRAVENOUS
  Filled 2019-05-16: qty 2000

## 2019-05-16 MED ORDER — WARFARIN SODIUM 7.5 MG PO TABS
7.5000 mg | ORAL_TABLET | Freq: Once | ORAL | Status: AC
  Administered 2019-05-16: 7.5 mg via ORAL
  Filled 2019-05-16: qty 1

## 2019-05-16 MED ORDER — SACUBITRIL-VALSARTAN 24-26 MG PO TABS
1.0000 | ORAL_TABLET | Freq: Two times a day (BID) | ORAL | Status: DC
  Administered 2019-05-16 – 2019-05-23 (×13): 1 via ORAL
  Filled 2019-05-16 (×14): qty 1

## 2019-05-16 NOTE — Evaluation (Signed)
Occupational Therapy Evaluation Patient Details Name: [name] MRN: [id number] DOB: 1961/02/04 Today's Date: 05/16/2019    History of Present Illness Pt adm with acute respiratory failure due to decompensated heart failure. Intubated 5/24 - 5/28. PMH - CHF, AVR, HTN, obesity, CKD   Clinical Impression   Pt is at Mod I (increased time) baseline level of function with ADLs and is min guard A with functional mobility. Pt very jovial, attentions seeking and with tangential speech, however very pleasant and cooperative. All education is complete and no further acute OT is indicated at this time    Follow Up Recommendations  Home health OT    Equipment Recommendations  None recommended by OT    Recommendations for Other Services       Precautions / Restrictions Precautions Precautions: Fall Restrictions Weight Bearing Restrictions: No      Mobility Bed Mobility               General bed mobility comments: sitting EOB upon arrival  Transfers Overall transfer level: Needs assistance Equipment used: Rolling walker (2 wheeled) Transfers: Sit to/from [facility] Sit to Stand: Min guard Stand pivot transfers: Min guard       General transfer comment: min guard with verbal cueing for safety due to lines and tubes    Balance Overall balance assessment: Needs assistance Sitting-balance support: No upper extremity supported;Feet supported Sitting balance-Leahy Scale: Good     Standing balance support: Bilateral upper extremity supported;During functional activity Standing balance-Leahy Scale: Fair                             ADL either performed or assessed with clinical judgement   ADL Overall ADL's : Modified independent;At baseline                                       General ADL Comments: Pt required multiple verbal cues/insturctions due to edema in R hand/wrist     Vision Baseline Vision/History: No visual  deficits Patient Visual Report: No change from baseline       Perception     Praxis      Pertinent Vitals/Pain Pain Assessment: Faces Faces Pain Scale: Hurts a little bit Pain Location: rt wrist  Pain Descriptors / Indicators: Aching;Discomfort Pain Intervention(s): Limited activity within patient's tolerance;Monitored during session;Repositioned     Hand Dominance Right   Extremity/Trunk Assessment Upper Extremity Assessment Upper Extremity Assessment: Overall WFL for tasks assessed;Generalized weakness;RUE deficits/detail RUE Deficits / Details: rt wrist and hand with edema limiting use    Lower Extremity Assessment Lower Extremity Assessment: Defer to PT evaluation   Cervical / Trunk Assessment Cervical / Trunk Assessment: Normal   Communication     Cognition Arousal/Alertness: Awake/alert Behavior During Therapy: WFL for tasks assessed/performed Overall Cognitive Status: No family/caregiver present to determine baseline cognitive functioning                                 General Comments: Suspect pt with baseline cognitive deficits which were also noted during hospitalization in Dec 2019. Pt acting like he knows the rehab tech with me when he has never met her before. Making questionable statements about how he hurt his rt wrist.    General Comments       Exercises  Shoulder Instructions      Home Living Family/patient expects to be discharged to:: Private residence Living Arrangements: Alone Available Help at Discharge: Friend(s) Type of Home: Apartment Home Access: Level entry     Home Layout: One level     Bathroom Shower/Tub: Teacher, early years/pre: Standard     Home Equipment: None          Prior Functioning/Environment Level of Independence: Independent                 OT Problem List:        OT Treatment/Interventions:      OT Goals(Current goals can be found in the care plan section) Acute Rehab  OT Goals Patient Stated Goal: return home OT Goal Formulation: With patient Time For Goal Achievement: 05/23/19  OT Frequency:     Barriers to D/C:    no barriers       Co-evaluation PT/OT/SLP Co-Evaluation/Treatment: Yes Reason for Co-Treatment: Necessary to address cognition/behavior during functional activity;For patient/therapist safety;To address functional/ADL transfers PT goals addressed during session: Mobility/safety with mobility;Balance;Proper use of DME;Strengthening/ROM OT goals addressed during session: ADL's and self-care;Proper use of Adaptive equipment and DME      AM-PAC OT "6 Clicks" Daily Activity     Outcome Measure Help from another person eating meals?: None Help from another person taking care of personal grooming?: None Help from another person toileting, which includes using toliet, bedpan, or urinal?: None Help from another person bathing (including washing, rinsing, drying)?: None Help from another person to put on and taking off regular upper body clothing?: None Help from another person to put on and taking off regular lower body clothing?: None 6 Click Score: 24   End of Session Equipment Utilized During Treatment: Rolling walker  Activity Tolerance: Patient tolerated treatment well Patient left: in chair;with call bell/phone within reach  OT Visit Diagnosis: Pain;Other symptoms and signs involving cognitive function;Muscle weakness (generalized) (M62.81) Pain - Right/Left: Right Pain - part of body: (wrist)                Time: 0981-1914 OT Time Calculation (min): 37 min Charges:  OT General Charges $OT Visit: 1 Visit OT Evaluation $OT Eval Moderate Complexity: 1 Mod    Britt Bottom 05/16/2019, 12:29 PM

## 2019-05-16 NOTE — Care Management Important Message (Signed)
Important Message  Patient Details  Name: [name] MRN: [id number] Date of Birth: 07-Sep-1961   Medicare Important Message Given:  Yes    [name] 05/16/2019, 1:02 PM

## 2019-05-16 NOTE — Progress Notes (Signed)
[facility] for Heparin/warfarin Indication: LV apical thrombus + Afib   Patient Measurements: Height: 5\' 11"  (180.3 cm) Weight: 254 lb 3.2 oz (115.3 kg) IBW/kg (Calculated) : 75.3 Heparin Dosing Weight: 100kg  Vital Signs: Temp: 98.7 F (37.1 C) (05/29 0545) Temp Source: Oral (05/29 0545) BP: 113/89 (05/29 0545) Pulse Rate: 73 (05/29 0545)  Labs: Recent Labs    05/14/19 0400 05/15/19 0357  05/15/19 2213 05/16/19 0339 05/16/19 1340  HGB 13.7 12.5*  --   --   --   --   HCT 43.2 39.5  --   --   --   --   PLT 180 187  --   --   --   --   LABPROT  --   --   --   --  16.2*  --   INR  --   --   --   --  1.3*  --   HEPARINUNFRC 0.36 0.26*   < > 0.28* 0.19* 0.55  CREATININE 2.03* 1.53*  --   --  1.38*  --    < > = values in this interval not displayed.     Medical History: Past Medical History:  Diagnosis Date  . Aortic valve disease    a. 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 Atrial flutter ([place])   . Benign hypertensive heart and renal disease   . Chronic combined systolic and diastolic CHF (congestive heart failure) ([place])   . Diabetes mellitus ([place])   . Essential hypertension 11/19/2014  . Insomnia   . Murmur   . Noncompliance   . Obesity   . Pulmonary hypertension ([facility])    Assessment: 50 yoM admitted with SOB requiring intubation. Labs reveal slightly elevated troponins and D-dimer >20. Baseline INR and aPTT normal, CBC wnl, no anticoagulation PTA. 5/24 ECHO showing small, fixed thrombus on apical wall of LV. Pharmacy consulted to dose Heparin for anticoagulation.   Heparin drip increased this am to 2100 uts/hr HL 0.55 at goal.   INR 1.3 < goal - would be good idea to have INR > 2 prior to dc heparin drip.   CBC stable yesterday - will check in am   Goal of Therapy:  Heparin level 0.3-0.7 units/ml  INR 2-3 Monitor platelets by anticoagulation protocol:  Yes   Plan:  - Continue Heparin drip at 2100 units/hr  Warfarin 7.5mg  x1 tonight  - Daily HL, PT/INR - Will continue to monitor for any bleeding complications   Bonnita Nasuti Pharm.D. CPP, BCPS Clinical Pharmacist [phone] 05/16/2019 3:36 PM

## 2019-05-16 NOTE — Progress Notes (Signed)
Physical Therapy Treatment Patient Details Name: [name] MRN: [id number] DOB: 1961/08/04 Today's Date: 05/16/2019    History of Present Illness Pt adm with acute respiratory failure due to decompensated heart failure. Intubated 5/24 - 5/28. PMH - CHF, AVR, HTN, obesity, CKD    PT Comments    Patient seen for mobility progression. Good progress towards goals. Sit to stand at bedside and other functional mobility at min guard to supervision level assist for safety. Does require cueing for safety with lines and tubes. PT updating d/c recs due to improvements in functional status. Would continue to recommend max [facility] services with need for 24/hr supervision at discharge. PT to continue to follow.   Follow Up Recommendations  Home health PT;Supervision/Assistance - 24 hour     Equipment Recommendations  Rolling walker with 5" wheels    Recommendations for Other Services       Precautions / Restrictions Precautions Precautions: Fall Restrictions Weight Bearing Restrictions: No    Mobility  Bed Mobility               General bed mobility comments: sitting EOB upon arrival  Transfers Overall transfer level: Needs assistance Equipment used: Rolling walker (2 wheeled) Transfers: Sit to/from [facility] Sit to Stand: Min guard Stand pivot transfers: Min guard       General transfer comment: min guard with verbal cueing for safety due to lines and tubes  Ambulation/Gait Ambulation/Gait assistance: Min guard;Supervision Gait Distance (Feet): 150 Feet Assistive device: Rolling walker (2 wheeled) Gait Pattern/deviations: Step-through pattern;Decreased stride length;Trunk flexed Gait velocity: decr   General Gait Details: use of RW for stability; able to ambulate short distance in room/bathroom without AD   Stairs             Wheelchair Mobility    Modified Rankin (Stroke Patients Only)       Balance Overall balance assessment: Needs  assistance Sitting-balance support: No upper extremity supported;Feet supported Sitting balance-Leahy Scale: Good     Standing balance support: Bilateral upper extremity supported;During functional activity Standing balance-Leahy Scale: Fair                              Cognition Arousal/Alertness: Awake/alert Behavior During Therapy: WFL for tasks assessed/performed Overall Cognitive Status: No family/caregiver present to determine baseline cognitive functioning                                 General Comments: Suspect pt with baseline cognitive deficits which were also noted during hospitalization in Dec 2019. Pt acting like he knows the rehab tech with me when he has never met her before. Making questionable statements about how he hurt his rt wrist.       Exercises      General Comments        Pertinent Vitals/Pain Pain Assessment: Faces Faces Pain Scale: Hurts a little bit Pain Location: rt wrist  Pain Descriptors / Indicators: Aching;Discomfort Pain Intervention(s): Limited activity within patient's tolerance;Monitored during session;Repositioned    Home Living                      Prior Function            PT Goals (current goals can now be found in the care plan section) Acute Rehab PT Goals Patient Stated Goal: return home PT Goal Formulation: With  patient Time For Goal Achievement: 05/29/19 Potential to Achieve Goals: Good Progress towards PT goals: Progressing toward goals    Frequency    Min 3X/week      PT Plan Current plan remains appropriate;Discharge plan needs to be updated    Co-evaluation PT/OT/SLP Co-Evaluation/Treatment: Yes Reason for Co-Treatment: Necessary to address cognition/behavior during functional activity;For patient/therapist safety;To address functional/ADL transfers PT goals addressed during session: Mobility/safety with mobility;Balance;Proper use of DME;Strengthening/ROM         AM-PAC PT "6 Clicks" Mobility   Outcome Measure  Help needed turning from your back to your side while in a flat bed without using bedrails?: None Help needed moving from lying on your back to sitting on the side of a flat bed without using bedrails?: A Little Help needed moving to and from a bed to a chair (including a wheelchair)?: A Little Help needed standing up from a chair using your arms (e.g., wheelchair or bedside chair)?: A Little Help needed to walk in hospital room?: A Little Help needed climbing 3-5 steps with a railing? : A Lot 6 Click Score: 18    End of Session Equipment Utilized During Treatment: Gait belt;Oxygen Activity Tolerance: Patient tolerated treatment well Patient left: in chair;with call bell/phone within reach;with chair alarm set Nurse Communication: Mobility status PT Visit Diagnosis: Unsteadiness on feet (R26.81);Other abnormalities of gait and mobility (R26.89);Muscle weakness (generalized) (M62.81)     Time: 8685-4883 PT Time Calculation (min) (ACUTE ONLY): 37 min  Charges:  $Gait Training: 8-22 mins                      Lanney Gins, PT, DPT Supplemental Physical Therapist 05/16/19 11:38 AM Pager: [phone] Office: [phone]

## 2019-05-16 NOTE — Progress Notes (Addendum)
Advanced Heart Failure Rounding Note   Subjective:    Gout pain in right wrist improving with steroids. Breathing is improved. No orthopnea or PND. On heparin for AF and LV clot. No bleeding. Eager to go home. Weight up 8 pounds over past 2 days   Objective:   Weight Range:  Vital Signs:   Temp:  [98.2 F (36.8 C)-99.7 F (37.6 C)] 98.7 F (37.1 C) (05/29 0545) Pulse Rate:  [69-82] 73 (05/29 0545) Resp:  [18-20] 20 (05/29 0545) BP: (113-155)/(81-97) 113/89 (05/29 0545) SpO2:  [98 %-100 %] 98 % (05/29 0545) Weight:  [115.3 kg] 115.3 kg (05/29 0545) Last BM Date: (PTA)  Weight change: Filed Weights   05/15/19 0500 05/15/19 1200 05/16/19 0545  Weight: 111.6 kg 114 kg 115.3 kg    Intake/Output:   Intake/Output Summary (Last 24 hours) at 05/16/2019 1211 Last data filed at 05/16/2019 0904 Gross per 24 hour  Intake 1631.56 ml  Output 650 ml  Net 981.56 ml     Physical Exam: General:  Well appearing. No resp difficulty HEENT: normal Neck: supple. no JVD. Carotids 2+ bilat; no bruits. No lymphadenopathy or thryomegaly appreciated. Cor: PMI nondisplaced. Regular rate & rhythm. No rubs, gallops or murmurs. Lungs: clear Abdomen: obese soft, nontender, nondistended. No hepatosplenomegaly. No bruits or masses. Good bowel sounds.  Extremities: no cyanosis, clubbing, rash, edema.  R wrist mild swelling but erythema improved Neuro: alert & orientedx3, cranial nerves grossly intact. moves all 4 extremities w/o difficulty. Affect pleasant    Telemetry: AF 60s + PVCs Personally reviewed    Labs: Basic Metabolic Panel: Recent Labs  Lab 05/12/19 0943 05/13/19 0646 05/13/19 1649 05/14/19 0400 05/15/19 0357 05/16/19 0339  NA 141 143  --  145 147* 141  K 4.3 4.2  --  4.2 3.6 5.2*  CL 107 108  --  105 106 104  CO2 18* 23  --  27 32 22  GLUCOSE 137* 123*  --  123* 102* 141*  BUN 26* 25*  --  41* 27* 26*  CREATININE 2.15* 1.90*  --  2.03* 1.53* 1.38*  CALCIUM 8.8* 9.0   --  9.2 9.0 9.4  MG 1.7 2.0 2.3 2.3 2.2  --   PHOS 3.0 3.3 3.9 4.6 3.6  --     Liver Function Tests: Recent Labs  Lab 05/11/19 0843 05/14/19 0400 05/15/19 0357  AST 26 36 37  ALT 23 47* 47*  ALKPHOS 105 80 101  BILITOT 1.7* 1.0 1.3*  PROT 8.4* 6.5 6.4*  ALBUMIN 4.1 3.1* 2.9*   No results for input(s): LIPASE, AMYLASE in the last 168 hours. No results for input(s): AMMONIA in the last 168 hours.  CBC: Recent Labs  Lab 05/11/19 0843  05/12/19 0423 05/12/19 0943 05/13/19 0646 05/14/19 0400 05/15/19 0357  WBC 14.4*  --   --  12.7* 12.6* 9.5 13.3*  NEUTROABS 11.7*  --   --   --   --   --   --   HGB 17.3*   < > 14.6 14.7 13.4 13.7 12.5*  HCT 55.8*   < > 43.0 44.3 41.9 43.2 39.5  MCV 86.9  --   --  83.0 85.5 85.9 85.5  PLT 253  --   --  170 172 180 187   < > = values in this interval not displayed.    Cardiac Enzymes: Recent Labs  Lab 05/11/19 0843  TROPONINI 0.14*    BNP: BNP (last 3 results) Recent  Labs    11/29/18 0808 05/11/19 0844  BNP 1,232.9* 1,980.2*    ProBNP (last 3 results) No results for input(s): PROBNP in the last 8760 hours.    Other results:  Imaging: Dg Wrist Complete Right  Result Date: 05/15/2019 CLINICAL DATA:  Right wrist pain since 0 05/11/2019. No reported injury. EXAM: RIGHT WRIST - COMPLETE 3+ VIEW COMPARISON:  None. FINDINGS: There is no evidence of fracture or dislocation. There is no evidence of arthropathy or other focal bone abnormality. Soft tissues are unremarkable. IMPRESSION: Negative exam. Electronically Signed   By: Inge Rise M.D.   On: 05/15/2019 19:33   Dg Chest Port 1 View  Result Date: 05/15/2019 CLINICAL DATA:  Acute respiratory failure. EXAM: PORTABLE CHEST 1 VIEW COMPARISON:  05/14/2019. FINDINGS: Interim extubation and removal of NG tube. Prior cardiac valve replacement. Surgical clips over the upper chest. Stable cardiomegaly. Stable left base atelectasis/infiltrate. Small left pleural effusion cannot be  excluded. No pneumothorax. IMPRESSION: 1.  Interim extubation and removal of NG tube. 2.  Prior cardiac valve replacement.  Stable cardiomegaly. 3. Stable left base atelectasis/infiltrate. Small left pleural effusion cannot be excluded. Electronically Signed   By: Decker   On: 05/15/2019 07:11     Medications:     Scheduled Medications: . feeding supplement (ENSURE ENLIVE)  237 mL Oral BID BM  . insulin aspart  0-20 Units Subcutaneous TID AC & HS  . isosorbide-hydrALAZINE  1 tablet Oral TID  . predniSONE  40 mg Oral Q breakfast  . senna  1 tablet Oral BID  . spironolactone  12.5 mg Oral Daily  . Warfarin - Pharmacist Dosing Inpatient   Does not apply q1800    Infusions: . ceFEPime (MAXIPIME) IV 2 g (05/16/19 0636)  . heparin 2,100 Units/hr (05/16/19 0630)    PRN Medications: acetaminophen, albuterol, sodium chloride flush   Assessment/plan:   1. Acute systolic HF/cardiogenic shock with biventricular HF - Echo 05/11/19 EF 20-25% with moderately decreased RV function  - Echo 12/19 EF 50% - Due to NICM (coronaries normal in 2016). Suspect due to probable tachy-induced CM (Was in AFL in 140s on admit) - Volume status climbing. Start lasix 80 po bid (was on 40 po bid at home). Watch closely - may need to cut back - Start Entresto 24/26 bid - Continue Bidil and spiro. - No b-blocker with sinus and AV node dysfunction - Blood hemolyzed so hyperkalemia artifactual. Recheck in am.   2. PAFL/ AFIB - suspect this may be cause of CM  - however previous ECGs also suggest possible AV dissociation with accelerated junctional outpacing his sinus - Now in AF with rate control - I have asked EP to see to question whether or not he may benefit from CRTpacing to help improve his CM and allow [place] to use AA as needed (versus AVN ablation). Will follow as outpatient - Continue heparin. Loading warfarin. Given LV clot would keep until INR 2.0 or greater but if he insists ok to d/c with  outpatient warfarin load as long as he understands the risk  3. Acute hypoxic respiratory failure - likely due to pulmonary edema. Improved with diuresis. Also treated for PNA - extubated 5/27  4. AK on CKD 3 - baseline creatinine 1.8. creatinine 1.4 today  5. 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 LV apical thrombus - continue heparin -> warfarin  INR 1.3. See discussion above  8. Morbid obesity -  will need weight loss   9. Hypomag - supped   10. R wrist pain - likely acute gout - on prednisone 40 daily day 2/3.  - uric acid elevated - start allopurinol tomorrow.    Length of Stay: 5   Glori Bickers MD 05/16/2019, 12:11 PM  Advanced Heart Failure Team Pager [phone] (M-F; 7a - 4p)  Please contact [facility] for night-coverage after hours (4p -7a ) and weekends on [email or web address]

## 2019-05-16 NOTE — Progress Notes (Signed)
ANTICOAGULATION CONSULT NOTE - Follow Up Consult  Pharmacy Consult for heparin Indication: LV apical thrombus, AVR, and Aflutter  Labs: Recent Labs    05/13/19 0646 05/14/19 0400 05/15/19 0357 05/15/19 1446 05/15/19 2213 05/16/19 0339  HGB 13.4 13.7 12.5*  --   --   --   HCT 41.9 43.2 39.5  --   --   --   PLT 172 180 187  --   --   --   LABPROT  --   --   --   --   --  16.2*  INR  --   --   --   --   --  1.3*  HEPARINUNFRC 0.41 0.36 0.26* 0.32 0.28* 0.19*  CREATININE 1.90* 2.03* 1.53*  --   --   --     Assessment: 58yo male subtherapeutic on heparin with lower heparin level despite rate increase; no gtt issues or signs of bleeding per RN.  Goal of Therapy:  Heparin level 0.3-0.7 units/ml   Plan:  Will give small heparin bolus of 2000 units and increase heparin gtt by 3 units/kg/hr to 2100 units/hr and check level in 6 hours.    Wynona Neat, PharmD, BCPS  05/16/2019,6:15 AM

## 2019-05-16 NOTE — Progress Notes (Signed)
[name]  PROGRESS NOTE    BRAXTON VANTREASE  ZWC:[id number] DOB: [birth year] DOA: 05/11/2019 PCP: [name], MD   Brief Narrative:   [name] is a 58 y.o. male with PMH of systolic heart failure (EF 15%), Aortic stenosis s/p replacement, ascending thoracic aortic aneurysm, pulmonary HTN, obesity, and HTN presents to the emergency department for evaluation of shortness of breath.  Symptoms have been worsening over the past 7 days.  He denies any fevers but has had some cough.  He notes worsening swelling in the legs per EMS and reports that he has been compliant with his Lasix.  Denies chest pain.  EMS state that initially the patient appeared to have mild symptoms.  He was placed on oxygen at home and allowed to gather several things around his room which she was able to walk and do.  After walking, they report significantly worsening respiratory distress requiring nonrebreather.  He noted significantly elevated blood pressures.  They gave aspirin in route along with 1 nitroglycerin without significant change in symptoms.    Assessment & Plan:   Active Problems:   Acute systolic CHF (congestive heart failure) (HCC)   Cardiogenic shock (HCC)   Acute respiratory failure (HCC)   Pseudomonas pneumonia (HCC)   Acute hypoxic respiratory failure requiring intubation      - due to CHF exacerbation with flash pulmonary edema and Pseudomonas pneumonia     - grew Pseudomonas in sputum     - Successfully extubated 5/27     - Wean oxygen; currentl on 3.5 liters [place]      - cefepime started 5/28 to finish out an additional 8 days of abx tx for PSA  Acute systolic HF/Cardiogenic shock w/ BiV HF     - Echo 05/11/19: EF 20-25%, mod decreased RVF     - remains on heparin gtt; warfarin started (pharm dosing)     - no BB d/t SA, AV nodal dysfxn     - BiDil and aldactone     - Cardiology feels he will eventually need pacemaker  Afib     - per cards; will d/w EP about CRT pacing per their note   LV apical thrombus     - warfarin  CKD stage 3     - looks like baseline creatinine about 1.8     - SCr is 1.38 today     - on spironolactone, monitor Scr  DM2      - SSI; monitor glucose  Constipation      - senna   DVT prophylaxis: wafarin Code Status: FULL   Disposition Plan: TBD   Consultants:   Cardiology  PCCM   Antimicrobials:  . Cefepime    Subjective: "I'm a little OCD, doc."  Objective: Vitals:   05/15/19 1638 05/15/19 2259 05/16/19 0109 05/16/19 0545  BP: (!) 155/97 (!) 142/85 132/83 113/89  Pulse: 69 82 72 73  Resp:   18 20  Temp:  99.7 F (37.6 C) 98.2 F (36.8 C) 98.7 F (37.1 C)  TempSrc:  Oral Oral Oral  SpO2: 100% 98% 98% 98%  Weight:    115.3 kg  Height:        Intake/Output Summary (Last 24 hours) at 05/16/2019 1125 Last data filed at 05/16/2019 0904 Gross per 24 hour  Intake 1631.56 ml  Output 650 ml  Net 981.56 ml   Filed Weights   05/15/19 0500 05/15/19 1200 05/16/19 0545  Weight: 111.6 kg 114 kg 115.3 kg  Examination:  General exam: 58 y.o. male Appears calm and comfortable  Respiratory system: Clear to auscultation anteriorly, some crackles at bases Cardiovascular system: S1 & S2 heard, RRR. 1/6 SEM, No JVD, rubs, gallops or clicks. BLE edema Gastrointestinal system: Abdomen is nondistended, soft and nontender. No organomegaly or masses felt. Normal bowel sounds heard. Central nervous system: Alert and oriented. No focal neurological deficits. Extremities: Symmetric 5 x 5 power.    Data Reviewed: I have personally reviewed following labs and imaging studies.  CBC: Recent Labs  Lab 05/11/19 0843  05/12/19 0423 05/12/19 0943 05/13/19 0646 05/14/19 0400 05/15/19 0357  WBC 14.4*  --   --  12.7* 12.6* 9.5 13.3*  NEUTROABS 11.7*  --   --   --   --   --   --   HGB 17.3*   < > 14.6 14.7 13.4 13.7 12.5*  HCT 55.8*   < > 43.0 44.3 41.9 43.2 39.5  MCV 86.9  --   --  83.0 85.5 85.9 85.5  PLT 253  --   --  170 172  180 187   < > = values in this interval not displayed.   Basic Metabolic Panel: Recent Labs  Lab 05/12/19 0943 05/13/19 0646 05/13/19 1649 05/14/19 0400 05/15/19 0357 05/16/19 0339  NA 141 143  --  145 147* 141  K 4.3 4.2  --  4.2 3.6 5.2*  CL 107 108  --  105 106 104  CO2 18* 23  --  27 32 22  GLUCOSE 137* 123*  --  123* 102* 141*  BUN 26* 25*  --  41* 27* 26*  CREATININE 2.15* 1.90*  --  2.03* 1.53* 1.38*  CALCIUM 8.8* 9.0  --  9.2 9.0 9.4  MG 1.7 2.0 2.3 2.3 2.2  --   PHOS 3.0 3.3 3.9 4.6 3.6  --    GFR: Estimated Creatinine Clearance: 75.3 mL/min (A) (by C-G formula based on SCr of 1.38 mg/dL (H)). Liver Function Tests: Recent Labs  Lab 05/11/19 0843 05/14/19 0400 05/15/19 0357  AST 26 36 37  ALT 23 47* 47*  ALKPHOS 105 80 101  BILITOT 1.7* 1.0 1.3*  PROT 8.4* 6.5 6.4*  ALBUMIN 4.1 3.1* 2.9*   No results for input(s): LIPASE, AMYLASE in the last 168 hours. No results for input(s): AMMONIA in the last 168 hours. Coagulation Profile: Recent Labs  Lab 05/11/19 1217 05/16/19 0339  INR 1.4* 1.3*   Cardiac Enzymes: Recent Labs  Lab 05/11/19 0843  TROPONINI 0.14*   BNP (last 3 results) No results for input(s): PROBNP in the last 8760 hours. HbA1C: No results for input(s): HGBA1C in the last 72 hours. CBG: Recent Labs  Lab 05/15/19 0802 05/15/19 1129 05/15/19 1617 05/15/19 2248 05/16/19 0627  GLUCAP 112* 119* 128* 143* 124*   Lipid Profile: Recent Labs    05/14/19 0400  TRIG 146   Thyroid Function Tests: No results for input(s): TSH, T4TOTAL, FREET4, T3FREE, THYROIDAB in the last 72 hours. Anemia Panel: No results for input(s): VITAMINB12, FOLATE, FERRITIN, TIBC, IRON, RETICCTPCT in the last 72 hours. Sepsis Labs: Recent Labs  Lab 05/11/19 1217  PROCALCITON 4.54  LATICACIDVEN 2.6*    Recent Results (from the past 240 hour(s))  SARS Coronavirus 2 (CEPHEID- Performed in [facility] hospital lab), Hosp Order     Status: None   Collection  Time: 05/11/19  8:44 AM  Result Value Ref Range Status   SARS Coronavirus 2 NEGATIVE NEGATIVE Final  Comment: (NOTE) If result is NEGATIVE SARS-CoV-2 target nucleic acids are NOT DETECTED. The SARS-CoV-2 RNA is generally detectable in upper and lower  respiratory specimens during the acute phase of infection. The lowest  concentration of SARS-CoV-2 viral copies this assay can detect is 250  copies / mL. A negative result does not preclude SARS-CoV-2 infection  and should not be used as the sole basis for treatment or other  patient management decisions.  A negative result may occur with  improper specimen collection / handling, submission of specimen other  than nasopharyngeal swab, presence of viral mutation(s) within the  areas targeted by this assay, and inadequate number of viral copies  (<250 copies / mL). A negative result must be combined with clinical  observations, patient history, and epidemiological information. If result is POSITIVE SARS-CoV-2 target nucleic acids are DETECTED. The SARS-CoV-2 RNA is generally detectable in upper and lower  respiratory specimens dur ing the acute phase of infection.  Positive  results are indicative of active infection with SARS-CoV-2.  Clinical  correlation with patient history and other diagnostic information is  necessary to determine patient infection status.  Positive results do  not rule out bacterial infection or co-infection with other viruses. If result is PRESUMPTIVE POSTIVE SARS-CoV-2 nucleic acids MAY BE PRESENT.   A presumptive positive result was obtained on the submitted specimen  and confirmed on repeat testing.  While 2019 novel coronavirus  (SARS-CoV-2) nucleic acids may be present in the submitted sample  additional confirmatory testing may be necessary for epidemiological  and / or clinical management purposes  to differentiate between  SARS-CoV-2 and other Sarbecovirus currently known to infect humans.  If  clinically indicated additional testing with an alternate test  methodology [id number]) is advised. The SARS-CoV-2 RNA is generally  detectable in upper and lower respiratory sp ecimens during the acute  phase of infection. The expected result is Negative. Fact Sheet for Patients:  StrictlyIdeas.no Fact Sheet for Healthcare Providers: BankingDealers.co.za This test is not yet approved or cleared by the [place] FDA and has been authorized for detection and/or diagnosis of SARS-CoV-2 by FDA under an Emergency Use Authorization (EUA).  This EUA will remain in effect (meaning this test can be used) for the duration of the COVID-19 declaration under Section 564(b)(1) of the Act, 21 U.S.C. section 360bbb-3(b)(1), unless the authorization is terminated or revoked sooner. Performed at [facility], [place] [address], [place],  [address]   Respiratory Panel by PCR     Status: None   Collection Time: 05/11/19 10:15 AM  Result Value Ref Range Status   Adenovirus NOT DETECTED NOT DETECTED Final   Coronavirus 229E NOT DETECTED NOT DETECTED Final    Comment: (NOTE) The Coronavirus on the Respiratory Panel, DOES NOT test for the novel  Coronavirus (2019 nCoV)    Coronavirus HKU1 NOT DETECTED NOT DETECTED Final   Coronavirus NL63 NOT DETECTED NOT DETECTED Final   Coronavirus OC43 NOT DETECTED NOT DETECTED Final   Metapneumovirus NOT DETECTED NOT DETECTED Final   Rhinovirus / Enterovirus NOT DETECTED NOT DETECTED Final   Influenza A NOT DETECTED NOT DETECTED Final   Influenza B NOT DETECTED NOT DETECTED Final   Parainfluenza Virus 1 NOT DETECTED NOT DETECTED Final   Parainfluenza Virus 2 NOT DETECTED NOT DETECTED Final   Parainfluenza Virus 3 NOT DETECTED NOT DETECTED Final   Parainfluenza Virus 4 NOT DETECTED NOT DETECTED Final   Respiratory Syncytial Virus NOT DETECTED NOT DETECTED Final  Bordetella pertussis NOT DETECTED NOT  DETECTED Final   Chlamydophila pneumoniae NOT DETECTED NOT DETECTED Final   Mycoplasma pneumoniae NOT DETECTED NOT DETECTED Final    Comment: Performed at [facility], [place] [address], [place], [place] [address]  MRSA PCR Screening     Status: Abnormal   Collection Time: 05/11/19 12:03 PM  Result Value Ref Range Status   MRSA by PCR POSITIVE (A) NEGATIVE Final    Comment:        The GeneXpert MRSA Assay (FDA approved for NASAL specimens only), is one component of a comprehensive MRSA colonization surveillance program. It is not intended to diagnose MRSA infection nor to guide or monitor treatment for MRSA infections. RESULT CALLED TO, READ BACK BY AND VERIFIED WITH: RN M CROSS [id number] AT [id number] BY CM Performed at [facility], [place] [address] [place], [place] [address]   Culture, blood (Routine X 2) w Reflex to ID Panel     Status: None (Preliminary result)   Collection Time: 05/11/19 12:17 PM  Result Value Ref Range Status   Specimen Description BLOOD BLOOD RIGHT HAND  Final   Special Requests AEROBIC BOTTLE ONLY Blood Culture adequate volume  Final   Culture   Final    NO GROWTH 4 DAYS Performed at [facility], [place] [address], [place], [place] [address]    Report Status PENDING  Incomplete  Culture, blood (Routine X 2) w Reflex to ID Panel     Status: None (Preliminary result)   Collection Time: 05/11/19 12:17 PM  Result Value Ref Range Status   Specimen Description BLOOD RIGHT ANTECUBITAL  Final   Special Requests   Final    AEROBIC BOTTLE ONLY Blood Culture results may not be optimal due to an inadequate volume of blood received in culture bottles   Culture   Final    NO GROWTH 4 DAYS Performed at [facility], [place] [address], [place], [place] [address]    Report Status PENDING  Incomplete  Culture, respiratory (non-expectorated)     Status: None   Collection Time: 05/11/19 11:57 PM  Result Value Ref Range Status   Specimen Description TRACHEAL  ASPIRATE  Final   Special Requests NONE  Final   Gram Stain   Final    ABUNDANT WBC PRESENT, PREDOMINANTLY PMN RARE GRAM NEGATIVE RODS RARE GRAM POSITIVE COCCI Performed at [facility], [place] [address], [place], [place] [address]    Culture RARE PSEUDOMONAS AERUGINOSA  Final   Report Status 05/15/2019 FINAL  Final   Organism ID, Bacteria PSEUDOMONAS AERUGINOSA  Final      Susceptibility   Pseudomonas aeruginosa - MIC*    CEFTAZIDIME 8 SENSITIVE Sensitive     CIPROFLOXACIN <=0.25 SENSITIVE Sensitive     GENTAMICIN <=1 SENSITIVE Sensitive     IMIPENEM 2 SENSITIVE Sensitive     PIP/TAZO 64 SENSITIVE Sensitive     CEFEPIME 4 SENSITIVE Sensitive     * RARE PSEUDOMONAS AERUGINOSA         Radiology Studies: Dg Wrist Complete Right  Result Date: 05/15/2019 CLINICAL DATA:  Right wrist pain since 0 05/11/2019. No reported injury. EXAM: RIGHT WRIST - COMPLETE 3+ VIEW COMPARISON:  None. FINDINGS: There is no evidence of fracture or dislocation. There is no evidence of arthropathy or other focal bone abnormality. Soft tissues are unremarkable. IMPRESSION: Negative exam. Electronically Signed   By: Inge Rise M.D.   On: 05/15/2019 19:33  Dg Chest Port 1 View  Result Date: 05/15/2019 CLINICAL DATA:  Acute respiratory failure. EXAM: PORTABLE CHEST 1 VIEW COMPARISON:  05/14/2019. FINDINGS: Interim extubation and removal of NG tube. Prior cardiac valve replacement. Surgical clips over the upper chest. Stable cardiomegaly. Stable left base atelectasis/infiltrate. Small left pleural effusion cannot be excluded. No pneumothorax. IMPRESSION: 1.  Interim extubation and removal of NG tube. 2.  Prior cardiac valve replacement.  Stable cardiomegaly. 3. Stable left base atelectasis/infiltrate. Small left pleural effusion cannot be excluded. Electronically Signed   By: Marcello Moores  Register   On: 05/15/2019 07:11        Scheduled Meds: . feeding supplement (ENSURE ENLIVE)  237 mL Oral BID BM  .  insulin aspart  0-20 Units Subcutaneous TID AC & HS  . isosorbide-hydrALAZINE  1 tablet Oral TID  . predniSONE  40 mg Oral Q breakfast  . senna  1 tablet Oral BID  . spironolactone  12.5 mg Oral Daily  . Warfarin - Pharmacist Dosing Inpatient   Does not apply q1800   Continuous Infusions: . ceFEPime (MAXIPIME) IV 2 g (05/16/19 0636)  . heparin 2,100 Units/hr (05/16/19 0630)     LOS: 5 days    Time spent: 25 minutes spent in the coordination of care today.     Jonnie Finner, DO [facility] Pager [phone]  If 7PM-7AM, please contact night-coverage [email or web address] Password [facility] 05/16/2019, 11:25 AM

## 2019-05-16 NOTE — TOC Transition Note (Signed)
Transition of Care [facility]) - CM/SW Discharge Note   Patient Details  Name: [name] MRN: [id number] Date of Birth: Nov 03, 1961  Transition of Care [facility]) CM/SW Contact:  Royston Bake, RN Phone Number: 05/16/2019, 1:36 PM   Clinical Narrative:    Patient lives at home; continues to drive some; noted Physical Therapy recommends SNF placement at discharge but patient refused; [facility] offered, patient refused that also. CM informed him that if he changed his mind and wants [place] after dc to contact his PCP.   Final next level of care: Home/Self Care Barriers to Discharge: No Barriers Identified   Patient Goals and CMS Choice Patient states their goals for this hospitalization and ongoing recovery are:: to get better and stay at home CMS Medicare.gov Compare Post Acute Care list provided to:: Patient Choice offered to / list presented to : NA  Discharge Placement                       Discharge Plan and Services In-house Referral: NA Discharge Planning Services: CM Consult Post Acute Care Choice: NA          DME Arranged: N/A DME Agency: NA       HH Arranged: Patient Refused [place] Agency: NA        Social Determinants of Health (SDOH) Interventions     Readmission Risk Interventions No flowsheet data found.

## 2019-05-17 LAB — BASIC METABOLIC PANEL
Anion gap: 10 (ref 5–15)
BUN: 36 mg/dL — ABNORMAL HIGH (ref 6–20)
CO2: 27 mmol/L (ref 22–32)
Calcium: 9.1 mg/dL (ref 8.9–10.3)
Chloride: 102 mmol/L (ref 98–111)
Creatinine, Ser: 1.18 mg/dL (ref 0.61–1.24)
GFR calc Af Amer: >60 mL/min (ref >60)
GFR calc non Af Amer: >60 mL/min (ref >60)
Glucose, Bld: 156 mg/dL — ABNORMAL HIGH (ref 70–99)
Potassium: 3.9 mmol/L (ref 3.5–5.1)
Sodium: 139 mmol/L (ref 135–145)

## 2019-05-17 LAB — GLUCOSE, CAPILLARY
Glucose-Capillary: 129 mg/dL — ABNORMAL HIGH (ref 70–99)
Glucose-Capillary: 128 mg/dL — ABNORMAL HIGH (ref 70–99)
Glucose-Capillary: 167 mg/dL — ABNORMAL HIGH (ref 70–99)

## 2019-05-17 LAB — PROTIME-INR
INR: 1.3 — ABNORMAL HIGH (ref 0.8–1.2)
Prothrombin Time: 15.7 seconds — ABNORMAL HIGH (ref 11.4–15.2)

## 2019-05-17 LAB — HEPARIN LEVEL (UNFRACTIONATED): Heparin Unfractionated: 0.5 IU/mL (ref 0.30–0.70)

## 2019-05-17 MED ORDER — GUAIFENESIN ER 600 MG PO TB12
600.0000 mg | ORAL_TABLET | Freq: Two times a day (BID) | ORAL | Status: DC
  Administered 2019-05-17 – 2019-05-23 (×13): 600 mg via ORAL
  Filled 2019-05-17 (×13): qty 1

## 2019-05-17 MED ORDER — WARFARIN SODIUM 10 MG PO TABS
10.0000 mg | ORAL_TABLET | Freq: Once | ORAL | Status: AC
  Administered 2019-05-17: 18:00:00 10 mg via ORAL
  Filled 2019-05-17: qty 1

## 2019-05-17 MED ORDER — BENZONATATE 100 MG PO CAPS
200.0000 mg | ORAL_CAPSULE | Freq: Three times a day (TID) | ORAL | Status: DC | PRN
  Administered 2019-05-17 – 2019-05-22 (×8): 200 mg via ORAL
  Filled 2019-05-17 (×8): qty 2

## 2019-05-17 MED ORDER — ALLOPURINOL 100 MG PO TABS
200.0000 mg | ORAL_TABLET | Freq: Every day | ORAL | Status: DC
  Administered 2019-05-17 – 2019-05-20 (×4): 200 mg via ORAL
  Filled 2019-05-17 (×6): qty 2

## 2019-05-17 NOTE — Plan of Care (Signed)

## 2019-05-17 NOTE — Progress Notes (Signed)
PROGRESS NOTE    CATALDO COSGRIFF  GGE:[id number] DOB: 1961-12-02 DOA: 05/11/2019 PCP: [name], MD   Brief Narrative:   Riaz Onorato Pattersonis a 58 y.o.malewith PMH of systolic heart failure (EF 15%), Aortic stenosis s/p replacement, ascending thoracic aortic aneurysm, pulmonary HTN, obesity, and HTN presentsto the emergency department for evaluation of shortness of breath. Symptoms have been worsening over the past 7days. He denies any fevers but has had some cough. He notes worsening swelling in the legs per EMS and reports that he has been compliant with his Lasix. Denies chest pain.EMS state that initially the patient appeared to have mild symptoms. He was placed on oxygen at home and allowed to gather several things around his room which she was able to walk and do. After walking, they report significantly worsening respiratory distress requiringnonrebreather.He noted significantly elevated blood pressures.They gave aspirin in route along with 1 nitroglycerin without significant change in symptoms.    Assessment & Plan:   Active Problems:   Acute systolic CHF (congestive heart failure) (HCC)   Cardiogenic shock (HCC)   Acute respiratory failure (HCC)   Pseudomonas pneumonia (HCC)   Acute hypoxic respiratory failure requiring intubation      - due to CHF exacerbation with flash pulmonary edema and Pseudomonas pneumonia     - grew Pseudomonas in sputum     - Successfully extubated 5/27     - Wean oxygen; currentl on 3.5 liters [place]      - cefepime started 5/28 to finish out an additional 8 days of abx tx for PSA  Acute systolic HF/Cardiogenic shock w/ BiV HF     - Echo 05/11/19: EF 20-25%, mod decreased RVF     - remains on heparin gtt; warfarin started (pharm dosing)     - no BB d/t SA, AV nodal dysfxn     - BiDil and aldactone     - Cardiology feels he will eventually need pacemaker  Afib     - per cards; will d/w EP about CRT pacing per their note   LV apical thrombus     - warfarin  CKD stage 2-3     - SCr is 1.18 today     - on spironolactone, monitor Scr  DM2      - SSI; monitor glucose  Constipation      - senna  Will continue cefepime atleast through Monday. Appreciate cards assistance. Pharm dosing coumadin. Continue as above.   DVT prophylaxis: Heparin Code Status: FULL   Disposition Plan: TBD   Consultants:   Cardiology  Antimicrobials:  . Cefepime    Subjective: "I'm ready to go."  Objective: Vitals:   05/16/19 2040 05/16/19 2210 05/17/19 0447 05/17/19 0455  BP: (!) 80/60 121/85  126/76  Pulse: 78 74  (!) 43  Resp: 20   20  Temp: 98.7 F (37.1 C)   98.7 F (37.1 C)  TempSrc:    Oral  SpO2: 93%   100%  Weight:   117.2 kg   Height:        Intake/Output Summary (Last 24 hours) at 05/17/2019 0653 Last data filed at 05/17/2019 0546 Gross per 24 hour  Intake 2527.07 ml  Output 1100 ml  Net 1427.07 ml   Filed Weights   05/15/19 1200 05/16/19 0545 05/17/19 0447  Weight: 114 kg 115.3 kg 117.2 kg    Examination:  General exam: 58 y.o. male Appears calm and comfortable  Respiratory system: Clear to auscultation anteriorly, some  crackles at bases Cardiovascular system: S1 & S2 heard, RRR. 1/6 SEM, No JVD, rubs, gallops or clicks. BLE edema Gastrointestinal system: Abdomen is nondistended, soft and nontender. No organomegaly or masses felt. Normal bowel sounds heard. Central nervous system: Alert and oriented. No focal neurological deficits. Extremities: Symmetric 5 x 5 power.    Data Reviewed: I have personally reviewed following labs and imaging studies.  CBC: Recent Labs  Lab 05/11/19 0843  05/12/19 0423 05/12/19 0943 05/13/19 0646 05/14/19 0400 05/15/19 0357  WBC 14.4*  --   --  12.7* 12.6* 9.5 13.3*  NEUTROABS 11.7*  --   --   --   --   --   --   HGB 17.3*   < > 14.6 14.7 13.4 13.7 12.5*  HCT 55.8*   < > 43.0 44.3 41.9 43.2 39.5  MCV 86.9  --   --  83.0 85.5 85.9 85.5  PLT  253  --   --  170 172 180 187   < > = values in this interval not displayed.   Basic Metabolic Panel: Recent Labs  Lab 05/12/19 0943 05/13/19 0646 05/13/19 1649 05/14/19 0400 05/15/19 0357 05/16/19 0339 05/17/19 0356  NA 141 143  --  145 147* 141 139  K 4.3 4.2  --  4.2 3.6 5.2* 3.9  CL 107 108  --  105 106 104 102  CO2 18* 23  --  27 32 22 27  GLUCOSE 137* 123*  --  123* 102* 141* 156*  BUN 26* 25*  --  41* 27* 26* 36*  CREATININE 2.15* 1.90*  --  2.03* 1.53* 1.38* 1.18  CALCIUM 8.8* 9.0  --  9.2 9.0 9.4 9.1  MG 1.7 2.0 2.3 2.3 2.2  --   --   PHOS 3.0 3.3 3.9 4.6 3.6  --   --    GFR: Estimated Creatinine Clearance: 88.9 mL/min (by C-G formula based on SCr of 1.18 mg/dL). Liver Function Tests: Recent Labs  Lab 05/11/19 0843 05/14/19 0400 05/15/19 0357  AST 26 36 37  ALT 23 47* 47*  ALKPHOS 105 80 101  BILITOT 1.7* 1.0 1.3*  PROT 8.4* 6.5 6.4*  ALBUMIN 4.1 3.1* 2.9*   No results for input(s): LIPASE, AMYLASE in the last 168 hours. No results for input(s): AMMONIA in the last 168 hours. Coagulation Profile: Recent Labs  Lab 05/11/19 1217 05/16/19 0339 05/17/19 0356  INR 1.4* 1.3* 1.3*   Cardiac Enzymes: Recent Labs  Lab 05/11/19 0843  TROPONINI 0.14*   BNP (last 3 results) No results for input(s): PROBNP in the last 8760 hours. HbA1C: No results for input(s): HGBA1C in the last 72 hours. CBG: Recent Labs  Lab 05/15/19 2248 05/16/19 0627 05/16/19 1144 05/16/19 1709 05/16/19 2203  GLUCAP 143* 124* 162* 140* 166*   Lipid Profile: No results for input(s): CHOL, HDL, LDLCALC, TRIG, CHOLHDL, LDLDIRECT in the last 72 hours. Thyroid Function Tests: No results for input(s): TSH, T4TOTAL, FREET4, T3FREE, THYROIDAB in the last 72 hours. Anemia Panel: No results for input(s): VITAMINB12, FOLATE, FERRITIN, TIBC, IRON, RETICCTPCT in the last 72 hours. Sepsis Labs: Recent Labs  Lab 05/11/19 1217  PROCALCITON 4.54  LATICACIDVEN 2.6*    Recent Results  (from the past 240 hour(s))  SARS Coronavirus 2 (CEPHEID- Performed in [place] hospital lab), Hosp Order     Status: None   Collection Time: 05/11/19  8:44 AM  Result Value Ref Range Status   SARS Coronavirus 2 NEGATIVE NEGATIVE Final  Comment: (NOTE) If result is NEGATIVE SARS-CoV-2 target nucleic acids are NOT DETECTED. The SARS-CoV-2 RNA is generally detectable in upper and lower  respiratory specimens during the acute phase of infection. The lowest  concentration of SARS-CoV-2 viral copies this assay can detect is 250  copies / mL. A negative result does not preclude SARS-CoV-2 infection  and should not be used as the sole basis for treatment or other  patient management decisions.  A negative result may occur with  improper specimen collection / handling, submission of specimen other  than nasopharyngeal swab, presence of viral mutation(s) within the  areas targeted by this assay, and inadequate number of viral copies  (<250 copies / mL). A negative result must be combined with clinical  observations, patient history, and epidemiological information. If result is POSITIVE SARS-CoV-2 target nucleic acids are DETECTED. The SARS-CoV-2 RNA is generally detectable in upper and lower  respiratory specimens dur ing the acute phase of infection.  Positive  results are indicative of active infection with SARS-CoV-2.  Clinical  correlation with patient history and other diagnostic information is  necessary to determine patient infection status.  Positive results do  not rule out bacterial infection or co-infection with other viruses. If result is PRESUMPTIVE POSTIVE SARS-CoV-2 nucleic acids MAY BE PRESENT.   A presumptive positive result was obtained on the submitted specimen  and confirmed on repeat testing.  While 2019 novel coronavirus  (SARS-CoV-2) nucleic acids may be present in the submitted sample  additional confirmatory testing may be necessary for epidemiological  and /  or clinical management purposes  to differentiate between  SARS-CoV-2 and other Sarbecovirus currently known to infect humans.  If clinically indicated additional testing with an alternate test  methodology [phone]) is advised. The SARS-CoV-2 RNA is generally  detectable in upper and lower respiratory sp ecimens during the acute  phase of infection. The expected result is Negative. Fact Sheet for Patients:  StrictlyIdeas.no Fact Sheet for Healthcare Providers: BankingDealers.co.za This test is not yet approved or cleared by the [place] FDA and has been authorized for detection and/or diagnosis of SARS-CoV-2 by FDA under an Emergency Use Authorization (EUA).  This EUA will remain in effect (meaning this test can be used) for the duration of the COVID-19 declaration under Section 564(b)(1) of the Act, 21 U.S.C. section 360bbb-3(b)(1), unless the authorization is terminated or revoked sooner. Performed at [facility], [place] [address] [place], [place] [address]   Respiratory Panel by PCR     Status: None   Collection Time: 05/11/19 10:15 AM  Result Value Ref Range Status   Adenovirus NOT DETECTED NOT DETECTED Final   Coronavirus 229E NOT DETECTED NOT DETECTED Final    Comment: (NOTE) The Coronavirus on the Respiratory Panel, DOES NOT test for the novel  Coronavirus (2019 nCoV)    Coronavirus HKU1 NOT DETECTED NOT DETECTED Final   Coronavirus NL63 NOT DETECTED NOT DETECTED Final   Coronavirus OC43 NOT DETECTED NOT DETECTED Final   Metapneumovirus NOT DETECTED NOT DETECTED Final   Rhinovirus / Enterovirus NOT DETECTED NOT DETECTED Final   Influenza A NOT DETECTED NOT DETECTED Final   Influenza B NOT DETECTED NOT DETECTED Final   Parainfluenza Virus 1 NOT DETECTED NOT DETECTED Final   Parainfluenza Virus 2 NOT DETECTED NOT DETECTED Final   Parainfluenza Virus 3 NOT DETECTED NOT DETECTED Final   Parainfluenza Virus 4 NOT  DETECTED NOT DETECTED Final   Respiratory Syncytial Virus NOT DETECTED NOT DETECTED Final  Bordetella pertussis NOT DETECTED NOT DETECTED Final   Chlamydophila pneumoniae NOT DETECTED NOT DETECTED Final   Mycoplasma pneumoniae NOT DETECTED NOT DETECTED Final    Comment: Performed at [facility], [place] [address], [place], [place] [address]  MRSA PCR Screening     Status: Abnormal   Collection Time: 05/11/19 12:03 PM  Result Value Ref Range Status   MRSA by PCR POSITIVE (A) NEGATIVE Final    Comment:        The GeneXpert MRSA Assay (FDA approved for NASAL specimens only), is one component of a comprehensive MRSA colonization surveillance program. It is not intended to diagnose MRSA infection nor to guide or monitor treatment for MRSA infections. RESULT CALLED TO, READ BACK BY AND VERIFIED WITH: RN M CROSS [id number] AT 4193 BY CM Performed at [facility], [place] [address] [place], [place] [address]   Culture, blood (Routine X 2) w Reflex to ID Panel     Status: None   Collection Time: 05/11/19 12:17 PM  Result Value Ref Range Status   Specimen Description BLOOD BLOOD RIGHT HAND  Final   Special Requests AEROBIC BOTTLE ONLY Blood Culture adequate volume  Final   Culture   Final    NO GROWTH 5 DAYS Performed at [facility], [place] [address], [place], [place] [address]    Report Status 05/16/2019 FINAL  Final  Culture, blood (Routine X 2) w Reflex to ID Panel     Status: None   Collection Time: 05/11/19 12:17 PM  Result Value Ref Range Status   Specimen Description BLOOD RIGHT ANTECUBITAL  Final   Special Requests   Final    AEROBIC BOTTLE ONLY Blood Culture results may not be optimal due to an inadequate volume of blood received in culture bottles   Culture   Final    NO GROWTH 5 DAYS Performed at [facility], [place] [address], [place], [place] [address]    Report Status 05/16/2019 FINAL  Final  Culture, respiratory (non-expectorated)     Status: None    Collection Time: 05/11/19 11:57 PM  Result Value Ref Range Status   Specimen Description TRACHEAL ASPIRATE  Final   Special Requests NONE  Final   Gram Stain   Final    ABUNDANT WBC PRESENT, PREDOMINANTLY PMN RARE GRAM NEGATIVE RODS RARE GRAM POSITIVE COCCI Performed at [facility], [place] [address], [place], [place] [address]    Culture RARE PSEUDOMONAS AERUGINOSA  Final   Report Status 05/15/2019 FINAL  Final   Organism ID, Bacteria PSEUDOMONAS AERUGINOSA  Final      Susceptibility   Pseudomonas aeruginosa - MIC*    CEFTAZIDIME 8 SENSITIVE Sensitive     CIPROFLOXACIN <=0.25 SENSITIVE Sensitive     GENTAMICIN <=1 SENSITIVE Sensitive     IMIPENEM 2 SENSITIVE Sensitive     PIP/TAZO 64 SENSITIVE Sensitive     CEFEPIME 4 SENSITIVE Sensitive     * RARE PSEUDOMONAS AERUGINOSA         Radiology Studies: Dg Wrist Complete Right  Result Date: 05/15/2019 CLINICAL DATA:  Right wrist pain since 0 05/11/2019. No reported injury. EXAM: RIGHT WRIST - COMPLETE 3+ VIEW COMPARISON:  None. FINDINGS: There is no evidence of fracture or dislocation. There is no evidence of arthropathy or other focal bone abnormality. Soft tissues are unremarkable. IMPRESSION: Negative exam. Electronically Signed   By: Inge Rise M.D.   On: 05/15/2019 19:33  Scheduled Meds: . feeding supplement (ENSURE ENLIVE)  237 mL Oral BID BM  . furosemide  40 mg Oral BID  . insulin aspart  0-20 Units Subcutaneous TID AC & HS  . isosorbide-hydrALAZINE  1 tablet Oral TID  . predniSONE  40 mg Oral Q breakfast  . sacubitril-valsartan  1 tablet Oral BID  . senna  1 tablet Oral BID  . spironolactone  12.5 mg Oral Daily  . Warfarin - Pharmacist Dosing Inpatient   Does not apply q1800   Continuous Infusions: . ceFEPime (MAXIPIME) IV 2 g (05/17/19 0553)  . heparin 2,100 Units/hr (05/17/19 0552)     LOS: 6 days    Time spent: 25 minutes spent in the coordination of care today.    Jonnie Finner, DO  [facility] Pager [phone]  If 7PM-7AM, please contact night-coverage [email or web address] Password [email or web address] 05/17/2019, 6:53 AM

## 2019-05-17 NOTE — Progress Notes (Signed)
[place] for Heparin/warfarin Indication: LV apical thrombus + Afib   Patient Measurements: Height: 5\' 11"  (180.3 cm) Weight: 258 lb 4.8 oz (117.2 kg) IBW/kg (Calculated) : 75.3 Heparin Dosing Weight: 100kg  Vital Signs: Temp: 98.7 F (37.1 C) (05/30 0455) Temp Source: Oral (05/30 0455) BP: 126/76 (05/30 0455) Pulse Rate: 43 (05/30 0455)  Labs: Recent Labs    05/15/19 0357  05/16/19 0339 05/16/19 1340 05/17/19 0356  HGB 12.5*  --   --   --   --   HCT 39.5  --   --   --   --   PLT 187  --   --   --   --   LABPROT  --   --  16.2*  --  15.7*  INR  --   --  1.3*  --  1.3*  HEPARINUNFRC 0.26*   < > 0.19* 0.55 0.50  CREATININE 1.53*  --  1.38*  --  1.18   < > = values in this interval not displayed.     Medical History: Past Medical History:  Diagnosis Date  . Aortic valve disease    a. 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 Atrial flutter ([place])   . Benign hypertensive heart and renal disease   . Chronic combined systolic and diastolic CHF (congestive heart failure) ([place])   . Diabetes mellitus ([place])   . Essential hypertension 11/19/2014  . Insomnia   . Murmur   . Noncompliance   . Obesity   . Pulmonary hypertension (HCC)    Assessment: 66 yoM admitted with SOB requiring intubation. Labs reveal slightly elevated troponins and D-dimer >20. Baseline INR and aPTT normal, CBC wnl, no anticoagulation PTA. 5/24 ECHO showing small, fixed thrombus on apical wall of LV. Pharmacy consulted to dose Heparin for anticoagulation.   Heparin drip increased this am to 2100 uts/hr HL 0.5 at goal.   INR 1.3 < goal - would be good idea to have INR > 2 prior to dc heparin drip.   CBC previously stable - will check in am   Goal of Therapy:  Heparin level 0.3-0.7 units/ml  INR 2-3 Monitor platelets by anticoagulation protocol: Yes   Plan:  - Continue Heparin drip at 2100  units/hr  Warfarin 10mg  x1 tonight  - Daily HL, PT/INR - Will continue to monitor for any bleeding complications   Bonnita Nasuti Pharm.D. CPP, BCPS Clinical Pharmacist [phone] 05/17/2019 7:43 AM

## 2019-05-17 NOTE — Progress Notes (Signed)
Advanced Heart Failure Rounding Note   Subjective:    Feels much better. Gout pain in right wrist resolved. Breathing well. No CP, SOB, orthopnea or PND.   Remains on heparin/warfarin. No bleeding   Objective:   Weight Range:  Vital Signs:   Temp:  [98.7 F (37.1 C)-98.9 F (37.2 C)] 98.9 F (37.2 C) (05/30 1438) Pulse Rate:  [43-78] 67 (05/30 1438) Resp:  [18-20] 18 (05/30 1438) BP: (80-126)/(60-85) 112/60 (05/30 1438) SpO2:  [91 %-100 %] 91 % (05/30 1438) Weight:  [117.2 kg] 117.2 kg (05/30 0447) Last BM Date: 05/16/19  Weight change: Filed Weights   05/15/19 1200 05/16/19 0545 05/17/19 0447  Weight: 114 kg 115.3 kg 117.2 kg    Intake/Output:   Intake/Output Summary (Last 24 hours) at 05/17/2019 1441 Last data filed at 05/17/2019 1200 Gross per 24 hour  Intake 2287.07 ml  Output 2100 ml  Net 187.07 ml     Physical Exam: General:  Well appearing. No resp difficulty HEENT: normal Neck: supple. no JVD. Carotids 2+ bilat; no bruits. No lymphadenopathy or thryomegaly appreciated. Cor: PMI nondisplaced. Irregular rate & rhythm. No rubs, gallops or murmurs. Lungs: clear Abdomen: soft, nontender, nondistended. No hepatosplenomegaly. No bruits or masses. Good bowel sounds. Extremities: no cyanosis, clubbing, rash, edema Neuro: alert & orientedx3, cranial nerves grossly intact. moves all 4 extremities w/o difficulty. Affect pleasant  Telemetry: AF 60-70s + PVCs Personally reviewed    Labs: Basic Metabolic Panel: Recent Labs  Lab 05/12/19 0943 05/13/19 0646 05/13/19 1649 05/14/19 0400 05/15/19 0357 05/16/19 0339 05/17/19 0356  NA 141 143  --  145 147* 141 139  K 4.3 4.2  --  4.2 3.6 5.2* 3.9  CL 107 108  --  105 106 104 102  CO2 18* 23  --  27 32 22 27  GLUCOSE 137* 123*  --  123* 102* 141* 156*  BUN 26* 25*  --  41* 27* 26* 36*  CREATININE 2.15* 1.90*  --  2.03* 1.53* 1.38* 1.18  CALCIUM 8.8* 9.0  --  9.2 9.0 9.4 9.1  MG 1.7 2.0 2.3 2.3 2.2  --    --   PHOS 3.0 3.3 3.9 4.6 3.6  --   --     Liver Function Tests: Recent Labs  Lab 05/11/19 0843 05/14/19 0400 05/15/19 0357  AST 26 36 37  ALT 23 47* 47*  ALKPHOS 105 80 101  BILITOT 1.7* 1.0 1.3*  PROT 8.4* 6.5 6.4*  ALBUMIN 4.1 3.1* 2.9*   No results for input(s): LIPASE, AMYLASE in the last 168 hours. No results for input(s): AMMONIA in the last 168 hours.  CBC: Recent Labs  Lab 05/11/19 0843  05/12/19 0423 05/12/19 0943 05/13/19 0646 05/14/19 0400 05/15/19 0357  WBC 14.4*  --   --  12.7* 12.6* 9.5 13.3*  NEUTROABS 11.7*  --   --   --   --   --   --   HGB 17.3*   < > 14.6 14.7 13.4 13.7 12.5*  HCT 55.8*   < > 43.0 44.3 41.9 43.2 39.5  MCV 86.9  --   --  83.0 85.5 85.9 85.5  PLT 253  --   --  170 172 180 187   < > = values in this interval not displayed.    Cardiac Enzymes: Recent Labs  Lab 05/11/19 0843  TROPONINI 0.14*    BNP: BNP (last 3 results) Recent Labs    11/29/18 0808 05/11/19 2353  BNP 1,232.9* 1,980.2*    ProBNP (last 3 results) No results for input(s): PROBNP in the last 8760 hours.    Other results:  Imaging: Dg Wrist Complete Right  Result Date: 05/15/2019 CLINICAL DATA:  Right wrist pain since 0 05/11/2019. No reported injury. EXAM: RIGHT WRIST - COMPLETE 3+ VIEW COMPARISON:  None. FINDINGS: There is no evidence of fracture or dislocation. There is no evidence of arthropathy or other focal bone abnormality. Soft tissues are unremarkable. IMPRESSION: Negative exam. Electronically Signed   By: Inge Rise M.D.   On: 05/15/2019 19:33     Medications:     Scheduled Medications: . feeding supplement (ENSURE ENLIVE)  237 mL Oral BID BM  . furosemide  40 mg Oral BID  . guaiFENesin  600 mg Oral BID  . insulin aspart  0-20 Units Subcutaneous TID AC & HS  . isosorbide-hydrALAZINE  1 tablet Oral TID  . predniSONE  40 mg Oral Q breakfast  . sacubitril-valsartan  1 tablet Oral BID  . senna  1 tablet Oral BID  . spironolactone   12.5 mg Oral Daily  . warfarin  10 mg Oral ONCE-1800  . Warfarin - Pharmacist Dosing Inpatient   Does not apply q1800    Infusions: . ceFEPime (MAXIPIME) IV 2 g (05/17/19 1349)  . heparin 2,100 Units/hr (05/17/19 0552)    PRN Medications: acetaminophen, albuterol, benzonatate, sodium chloride flush   Assessment/plan:   1. Acute systolic HF/cardiogenic shock with biventricular HF - Echo 05/11/19 EF 20-25% with moderately decreased RV function  - Echo 12/19 EF 50% - Due to NICM (coronaries normal in 2016). Suspect due to probable tachy-induced CM (Was in AFL in 140s on admit) - Weight up today. Despite restarting oral diuretics but doesn't look volume overloaded on exam Will continue to follow closely. Can switch to torsemide as needed.  - Continue Entresto 24/26 bid - Continue Bidil and spiro. - No b-blocker with sinus and AV node dysfunction  2. PAFL/ AFIB - suspect this may be cause of CM  - however previous ECGs also suggest possible AV dissociation with accelerated junctional outpacing his sinus - Now in AF with rate control - I have asked EP to see to question whether or not he may benefit from CRTpacing to help improve his CM and allow [place] to use AA as needed (versus AVN ablation). Will follow as outpatient - Continue heparin. Loading warfarin. INR 1.3 No bleeding. Discussed dosing with PharmD personally.  3. Acute hypoxic respiratory failure - likely due to pulmonary edema. Improved with diuresis. Also treated for PNA - extubated 5/27  4. AK on CKD 3 - baseline creatinine 1.8. creatinine 1.2 today  5. 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 LV apical thrombus - continue heparin -> warfarin  INR 1.3. See discussion above  8. Morbid obesity - will need weight loss   9. Hypomag - supped   10. R wrist pain - likely acute gout - on prednisone 40 daily day 3/3.  - uric acid elevated - start allopurinol today.    Length  of Stay: 6   Glori Bickers MD 05/17/2019, 2:41 PM  Advanced Heart Failure Team Pager [phone] (M-F; 7a - 4p)  Please contact [facility] for night-coverage after hours (4p -7a ) and weekends on [email or web address]

## 2019-05-17 NOTE — Progress Notes (Signed)
Had discussion with patient about his fluid restriction. Stated he did not know he had a restriction despite having a conversation with a patient previous shifts. Educated patient on the need to monitor fluid intake. Patient verbalized understanding though further education will be necessary.

## 2019-05-18 DIAGNOSIS — D62 Acute posthemorrhagic anemia: Secondary | ICD-10-CM

## 2019-05-18 DIAGNOSIS — J151 Pneumonia due to Pseudomonas: Secondary | ICD-10-CM

## 2019-05-18 DIAGNOSIS — K625 Hemorrhage of anus and rectum: Secondary | ICD-10-CM

## 2019-05-18 DIAGNOSIS — D689 Coagulation defect, unspecified: Secondary | ICD-10-CM

## 2019-05-18 LAB — CBC
HCT: 29.1 % — ABNORMAL LOW (ref 39.0–52.0)
HCT: 32.5 % — ABNORMAL LOW (ref 39.0–52.0)
Hemoglobin: 9.5 g/dL — ABNORMAL LOW (ref 13.0–17.0)
Hemoglobin: 10.3 g/dL — ABNORMAL LOW (ref 13.0–17.0)
MCH: 28.2 pg (ref 26.0–34.0)
MCH: 27 pg (ref 26.0–34.0)
MCHC: 32.6 g/dL (ref 30.0–36.0)
MCHC: 31.7 g/dL (ref 30.0–36.0)
MCV: 86.4 fL (ref 80.0–100.0)
MCV: 85.1 fL (ref 80.0–100.0)
Platelets: 254 10*3/uL (ref 150–400)
Platelets: 284 10*3/uL (ref 150–400)
RBC: 3.37 MIL/uL — ABNORMAL LOW (ref 4.22–5.81)
RBC: 3.82 MIL/uL — ABNORMAL LOW (ref 4.22–5.81)
RDW: 14.6 % (ref 11.5–15.5)
RDW: 14.7 % (ref 11.5–15.5)
WBC: 16.9 10*3/uL — ABNORMAL HIGH (ref 4.0–10.5)
WBC: 15.8 10*3/uL — ABNORMAL HIGH (ref 4.0–10.5)
nRBC: 0.2 % (ref 0.0–0.2)
nRBC: 0.3 % — ABNORMAL HIGH (ref 0.0–0.2)

## 2019-05-18 LAB — PROTIME-INR
INR: 1.3 — ABNORMAL HIGH (ref 0.8–1.2)
Prothrombin Time: 16 seconds — ABNORMAL HIGH (ref 11.4–15.2)

## 2019-05-18 LAB — BASIC METABOLIC PANEL
Anion gap: 10 (ref 5–15)
BUN: 44 mg/dL — ABNORMAL HIGH (ref 6–20)
CO2: 27 mmol/L (ref 22–32)
Calcium: 8.8 mg/dL — ABNORMAL LOW (ref 8.9–10.3)
Chloride: 103 mmol/L (ref 98–111)
Creatinine, Ser: 1.1 mg/dL (ref 0.61–1.24)
GFR calc Af Amer: >60 mL/min (ref >60)
GFR calc non Af Amer: >60 mL/min (ref >60)
Glucose, Bld: 131 mg/dL — ABNORMAL HIGH (ref 70–99)
Potassium: 3.8 mmol/L (ref 3.5–5.1)
Sodium: 140 mmol/L (ref 135–145)

## 2019-05-18 LAB — GLUCOSE, CAPILLARY
Glucose-Capillary: 165 mg/dL — ABNORMAL HIGH (ref 70–99)
Glucose-Capillary: 113 mg/dL — ABNORMAL HIGH (ref 70–99)
Glucose-Capillary: 140 mg/dL — ABNORMAL HIGH (ref 70–99)
Glucose-Capillary: 211 mg/dL — ABNORMAL HIGH (ref 70–99)
Glucose-Capillary: 164 mg/dL — ABNORMAL HIGH (ref 70–99)

## 2019-05-18 LAB — MAGNESIUM: Magnesium: 1.8 mg/dL (ref 1.7–2.4)

## 2019-05-18 LAB — HEPARIN LEVEL (UNFRACTIONATED): Heparin Unfractionated: 0.58 IU/mL (ref 0.30–0.70)

## 2019-05-18 MED ORDER — PANTOPRAZOLE SODIUM 40 MG IV SOLR
40.0000 mg | Freq: Two times a day (BID) | INTRAVENOUS | Status: DC
  Administered 2019-05-18 (×2): 40 mg via INTRAVENOUS
  Filled 2019-05-18 (×2): qty 40

## 2019-05-18 MED ORDER — WARFARIN SODIUM 2.5 MG PO TABS: 12.5000 mg | ORAL_TABLET | Freq: Once | ORAL | Status: DC

## 2019-05-18 MED ORDER — POTASSIUM CHLORIDE CRYS ER 20 MEQ PO TBCR
40.0000 meq | EXTENDED_RELEASE_TABLET | Freq: Once | ORAL | Status: AC
  Administered 2019-05-18: 10:00:00 40 meq via ORAL
  Filled 2019-05-18: qty 2

## 2019-05-18 NOTE — Progress Notes (Signed)
Advanced Heart Failure Rounding Note   Subjective:    Gout pain in right wrist resolved with steroids.  Feels good. No SOB, orthopnea or PND.   Weight down 4 pounds. Remains on heparin/warfarin. No bleeding    Objective:   Weight Range:  Vital Signs:   Temp:  [98.3 F (36.8 C)-99.8 F (37.7 C)] 98.3 F (36.8 C) (05/31 1216) Pulse Rate:  [44-90] 82 (05/31 1216) Resp:  [18-20] 20 (05/31 1216) BP: (106-152)/(60-94) 106/65 (05/31 1216) SpO2:  [91 %-95 %] 95 % (05/31 1216) Weight:  [115.5 kg] 115.5 kg (05/31 0559) Last BM Date: 05/16/19  Weight change: Filed Weights   05/16/19 0545 05/17/19 0447 05/18/19 0559  Weight: 115.3 kg 117.2 kg 115.5 kg    Intake/Output:   Intake/Output Summary (Last 24 hours) at 05/18/2019 1235 Last data filed at 05/18/2019 1443 Gross per 24 hour  Intake 1752.96 ml  Output 2250 ml  Net -497.04 ml     Physical Exam: General:  Well appearing. No resp difficulty HEENT: normal Neck: supple. no JVD. Carotids 2+ bilat; no bruits. No lymphadenopathy or thryomegaly appreciated. Cor: PMI nondisplaced. Regular rate & rhythm. No rubs, gallops or murmurs. Lungs: clear Abdomen: soft, nontender, nondistended. No hepatosplenomegaly. No bruits or masses. Good bowel sounds. Extremities: no cyanosis, clubbing, rash, edema Neuro: alert & orientedx3, cranial nerves grossly intact. moves all 4 extremities w/o difficulty. Affect pleasant    Telemetry: AF 60s + PVCs Personally reviewed    Labs: Basic Metabolic Panel: Recent Labs  Lab 05/12/19 0943 05/13/19 0646 05/13/19 1649 05/14/19 0400 05/15/19 0357 05/16/19 0339 05/17/19 0356 05/18/19 0437  NA 141 143  --  145 147* 141 139 140  K 4.3 4.2  --  4.2 3.6 5.2* 3.9 3.8  CL 107 108  --  105 106 104 102 103  CO2 18* 23  --  27 32 22 27 27   GLUCOSE 137* 123*  --  123* 102* 141* 156* 131*  BUN 26* 25*  --  41* 27* 26* 36* 44*  CREATININE 2.15* 1.90*  --  2.03* 1.53* 1.38* 1.18 1.10  CALCIUM  8.8* 9.0  --  9.2 9.0 9.4 9.1 8.8*  MG 1.7 2.0 2.3 2.3 2.2  --   --  1.8  PHOS 3.0 3.3 3.9 4.6 3.6  --   --   --     Liver Function Tests: Recent Labs  Lab 05/14/19 0400 05/15/19 0357  AST 36 37  ALT 47* 47*  ALKPHOS 80 101  BILITOT 1.0 1.3*  PROT 6.5 6.4*  ALBUMIN 3.1* 2.9*   No results for input(s): LIPASE, AMYLASE in the last 168 hours. No results for input(s): AMMONIA in the last 168 hours.  CBC: Recent Labs  Lab 05/12/19 0943 05/13/19 0646 05/14/19 0400 05/15/19 0357 05/18/19 0437  WBC 12.7* 12.6* 9.5 13.3* 16.9*  HGB 14.7 13.4 13.7 12.5* 9.5*  HCT 44.3 41.9 43.2 39.5 29.1*  MCV 83.0 85.5 85.9 85.5 86.4  PLT 170 172 180 187 254    Cardiac Enzymes: No results for input(s): CKTOTAL, CKMB, CKMBINDEX, TROPONINI in the last 168 hours.  BNP: BNP (last 3 results) Recent Labs    11/29/18 0808 05/11/19 0844  BNP 1,232.9* 1,980.2*    ProBNP (last 3 results) No results for input(s): PROBNP in the last 8760 hours.    Other results:  Imaging: No results found.   Medications:     Scheduled Medications: . allopurinol  200 mg Oral Daily  .  feeding supplement (ENSURE ENLIVE)  237 mL Oral BID BM  . furosemide  40 mg Oral BID  . guaiFENesin  600 mg Oral BID  . insulin aspart  0-20 Units Subcutaneous TID AC & HS  . isosorbide-hydrALAZINE  1 tablet Oral TID  . predniSONE  40 mg Oral Q breakfast  . sacubitril-valsartan  1 tablet Oral BID  . senna  1 tablet Oral BID  . spironolactone  12.5 mg Oral Daily  . warfarin  12.5 mg Oral ONCE-1800  . Warfarin - Pharmacist Dosing Inpatient   Does not apply q1800    Infusions: . ceFEPime (MAXIPIME) IV 2 g (05/18/19 0609)  . heparin 2,100 Units/hr (05/18/19 7915)    PRN Medications: acetaminophen, albuterol, benzonatate, sodium chloride flush   Assessment/plan:   1. Acute systolic HF/cardiogenic shock with biventricular HF - Echo 05/11/19 EF 20-25% with moderately decreased RV function  - Echo 12/19 EF 50% -  Due to NICM (coronaries normal in 2016). Suspect due to probable tachy-induced CM (Was in AFL in 140s on admit) - Volume status looks god on lasix 80 bid (was on 40 po bid at home). Will continue  - Entresto 24/26 bid started 5/30. Tolerating well  - Continue Bidil and spiro. - No b-blocker with sinus and AV node dysfunction  2. PAFL/ AFIB - suspect this may be cause of CM  - however previous ECGs also suggest possible AV dissociation with accelerated junctional outpacing his sinus - Now in AF with rate control - I have asked EP to see to question whether or not he may benefit from CRTpacing to help improve his CM and allow [place] to use AA as needed (versus AVN ablation). Will follow as outpatient - Continue heparin. Loading warfarin. Given LV clot would keep until INR 2.0 or greater but if he insists ok to d/c with outpatient warfarin load as long as he understands the risk  3. Acute hypoxic respiratory failure - likely due to pulmonary edema. Improved with diuresis. Also treated for PNA - extubated 5/27  4. AK on CKD 3 - baseline creatinine 1.8. creatinine 1.1 today  5. 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 LV apical thrombus - continue heparin -> warfarin  INR 1.7. See discussion above Discussed dosing with PharmD personally.  8. Morbid obesity - will need weight loss   9. Hypomag - supped but still low. Will give another dose today  10. R wrist pain - likely acute gout - on prednisone 40 daily day 3/3.  - uric acid elevated - allopurinol 200 daily started.    Length of Stay: 7   Glori Bickers MD 05/18/2019, 12:35 PM  Advanced Heart Failure Team Pager [phone] (M-F; 7a - 4p)  Please contact [facility] for night-coverage after hours (4p -7a ) and weekends on [email or web address]

## 2019-05-18 NOTE — Progress Notes (Signed)
Pt BP 109/55 (72) P 65, NAD noted. Dr. Marylyn Ishihara notified, advised to hold Bidil for SBP <110, rx held this dose.

## 2019-05-18 NOTE — Progress Notes (Signed)
[place] for Heparin/warfarin Indication: LV apical thrombus + Afib   Patient Measurements: Height: 5\' 11"  (180.3 cm) Weight: 254 lb 11.2 oz (115.5 kg) IBW/kg (Calculated) : 75.3 Heparin Dosing Weight: 100kg  Vital Signs: Temp: 99.8 F (37.7 C) (05/31 0602) Temp Source: Oral (05/31 0602) BP: 140/94 (05/31 0602) Pulse Rate: 73 (05/31 0602)  Labs: Recent Labs    05/16/19 0339 05/16/19 1340 05/17/19 0356 05/18/19 0437  HGB  --   --   --  9.5*  HCT  --   --   --  29.1*  PLT  --   --   --  254  LABPROT 16.2*  --  15.7* 16.0*  INR 1.3*  --  1.3* 1.3*  HEPARINUNFRC 0.19* 0.55 0.50 0.58  CREATININE 1.38*  --  1.18 1.10     Medical History: Past Medical History:  Diagnosis Date  . Aortic valve disease    a. 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 Atrial flutter ([place])   . Benign hypertensive heart and renal disease   . Chronic combined systolic and diastolic CHF (congestive heart failure) ([place])   . Diabetes mellitus ([place])   . Essential hypertension 11/19/2014  . Insomnia   . Murmur   . Noncompliance   . Obesity   . Pulmonary hypertension (HCC)    Assessment: 86 yoM admitted with SOB requiring intubation. Labs reveal slightly elevated troponins and D-dimer >20. Baseline INR and aPTT normal, CBC wnl, no anticoagulation PTA. 5/24 ECHO showing small, fixed thrombus on apical wall of LV. Pharmacy consulted to dose Heparin for anticoagulation.   Heparin drip increased this am to 2100 uts/hr HL 0.58 at goal.   INR 1.3 < goal - would be good idea to have INR > 2 prior to dc heparin drip.   CBC previously stable - will check in am   Goal of Therapy:  Heparin level 0.3-0.7 units/ml  INR 2-3 Monitor platelets by anticoagulation protocol: Yes   Plan:  - Continue Heparin drip at 2100 units/hr  Warfarin 12.5 mg x1 tonight  - Daily HL, PT/INR - Will continue to monitor  for any bleeding complications   Marguerite Olea, [facility] Clinical Pharmacist Phone [phone]  05/18/2019 7:37 AM

## 2019-05-18 NOTE — Consult Note (Addendum)
Referring Provider: No ref. provider found Primary Care Physician:  [name], MD Primary Gastroenterologist:  Althia Forts  Reason for Consultation: Anemia, Melena  HPI: [name] is a 58 y.o. maleis a 58 y.o.malewith PMH of systolic heart failure (EF 15%), Aortic stenosis s/p replacement 2016, ascending thoracic aortic aneurysm, atrial flutter, pulmonary HTN, obesity, and  CKD stage III. He developed significant SOB and lower extremity edema. He presented to the ED on 05/12/2019. He developed acute respiratory failure, he failed Bipap and was intubated in the ED. Found to have pseudomonas pneumonia. He developed cardiogenic shock. An Echo showed severely depressed left ventricular systolic function with a dilated left ventricle and ejection fraction of 20%. He has a small left ventricular apical thrombus that does not appear to be mobile. He was briefly on Dobutamine.He was extubated 5/27. His respiratory status improved on IV antibiotics. He was started on Heparin 2100 u/hr due the LV apical thrombus,  at high risk for DVT/PE. Coumadin 12.5 mg po was added, plan was to obtain a therapeutic dose of Coumadin then wean off Heparin weaned prior to discharge home. He was taking ASA 81mg  daily prior to his admission. He is on Prednisone 40mg  daily started on 5/29. PPI was started this afternoon.  He reported passing sticky black stool 1 to 2 times daily for the past 3 days. No current bright red rectal bleeding but he intermittently sees bright red blood on the TP and on his stool.  No heartburn, stomach or lower abdominal pain. No prior history of GI bleed. He had a colonoscopy in 2006 which he reported was normal. His Hg dropped from 12.6 down to 9.5. Repeat Hg this afternoon up to 10.3. GI consult was requested to evaluate for GI bleed on Coumadin and Heparin, will need chronic oral anticoagulation.     Past Medical History:  Diagnosis Date  . Aortic valve disease    a. severe  AI/severe AS/bicuspid AV s/p bioprosthetic aortic valve with replacement of ascending aorta 2016  . Atrial flutter ([place])   . Benign hypertensive heart and renal disease   . Chronic combined systolic and diastolic CHF (congestive heart failure) ([place])   . Diabetes mellitus ([place])   . Essential hypertension 11/19/2014  . Insomnia   . Murmur   . Noncompliance   . Obesity   . Pulmonary hypertension ([place])     Past Surgical History:  Procedure Laterality Date  . AORTIC VALVE REPLACEMENT N/A 04/05/2015   Procedure: AORTIC VALVE REPLACEMENT (AVR) using a 74mm Edwards Aortic Magna Ease Valve ;  Surgeon: [name], MD;  Location: [place];  Service: Open Heart Surgery;  Laterality: N/A;  . LEFT AND RIGHT HEART CATHETERIZATION WITH CORONARY ANGIOGRAM N/A 11/27/2014   Procedure: LEFT AND RIGHT HEART CATHETERIZATION WITH CORONARY ANGIOGRAM;  Surgeon: [name], MD;  Location[facility] C[facility] LAB;  Service: Cardiovascular;  Laterality: N/A;  . MAZE N/A 04/05/2015   Procedure: MAZE;  Surgeon: [name], MD;  Location: [place];  Service: Open Heart Surgery;  Laterality: N/A;  . REPLACEMENT ASCENDING AORTA N/A 04/05/2015   Procedure: REPLACEMENT ASCENDING AORTA with a 44mm Hemashield Platinum Graft;  Surgeon: [name], MD;  Location: [place];  Service: Open Heart Surgery;  Laterality: N/A;  . TEE WITHOUT CARDIOVERSION N/A 04/05/2015   Procedure: TRANSESOPHAGEAL ECHOCARDIOGRAM (TEE);  Surgeon: [name], MD;  Location: [place];  Service: Open Heart Surgery;  Laterality: N/A;    Prior to Admission medications  Medication Sig Start Date End Date Taking? Authorizing Provider  aspirin EC 81 MG tablet Take 81 mg by mouth daily.   Yes [provider]  carvedilol (COREG) 3.125 MG tablet Take 3.125 mg by mouth 2 (two) times daily. 02/10/19  Yes [provider]  furosemide (LASIX) 40 MG tablet Take 40 mg by mouth 2 (two) times daily.   Yes [provider]  hydrALAZINE  (APRESOLINE) 25 MG tablet Take 25 mg by mouth 2 (two) times daily. 12/18/18  Yes [provider]  isosorbide mononitrate (ISMO,MONOKET) 10 MG tablet Take 10 mg by mouth daily.   Yes [provider]  lisinopril (ZESTRIL) 40 MG tablet Take 40 mg by mouth daily.   Yes [provider]  rosuvastatin (CRESTOR) 10 MG tablet Take 10 mg by mouth daily.   Yes [provider]    Current Facility-Administered Medications  Medication Dose Route Frequency Provider Last Rate Last Dose  . acetaminophen (TYLENOL) tablet 650 mg  650 mg Oral Q4H PRN Erick Colace, NP   650 mg at 05/15/19 2249  . albuterol (PROVENTIL) (2.5 MG/3ML) 0.083% nebulizer solution 2.5 mg  2.5 mg Nebulization Q2H PRN Erick Colace, NP   2.5 mg at 05/13/19 0720  . allopurinol (ZYLOPRIM) tablet 200 mg  200 mg Oral Daily Bensimhon, Shaune Pascal, MD   200 mg at 05/18/19 2025  . benzonatate (TESSALON) capsule 200 mg  200 mg Oral TID PRN Arby Barrette A, NP   200 mg at 05/18/19 1234  . ceFEPIme (MAXIPIME) 2 g in sodium chloride 0.9 % 100 mL IVPB  2 g Intravenous Q8H Rush Barer, RPH   Stopped at 05/18/19 1447  . feeding supplement (ENSURE ENLIVE) (ENSURE ENLIVE) liquid 237 mL  237 mL Oral BID BM Mannam, Praveen, MD   237 mL at 05/18/19 1415  . furosemide (LASIX) tablet 40 mg  40 mg Oral BID Bensimhon, Shaune Pascal, MD   Stopped at 05/18/19 1119  . guaiFENesin (MUCINEX) 12 hr tablet 600 mg  600 mg Oral BID Marylyn Ishihara, Tyrone A, DO   600 mg at 05/18/19 0957  . heparin ADULT infusion 100 units/mL (25000 units/214mL sodium chloride 0.45%)  2,100 Units/hr Intravenous Continuous Laren Everts, RPH 21 mL/hr at 05/18/19 1500 2,100 Units/hr at 05/18/19 1500  . insulin aspart (novoLOG) injection 0-20 Units  0-20 Units Subcutaneous TID AC & HS Erick Colace, NP   4 Units at 05/17/19 2200  . isosorbide-hydrALAZINE (BIDIL) 20-37.5 MG per tablet 1 tablet  1 tablet Oral TID Erick Colace, NP   1 tablet at 05/18/19  1235  . pantoprazole (PROTONIX) injection 40 mg  40 mg Intravenous Q12H Kyle, Tyrone A, DO      . predniSONE (DELTASONE) tablet 40 mg  40 mg Oral Q breakfast Bensimhon, Shaune Pascal, MD   40 mg at 05/18/19 0956  . sacubitril-valsartan (ENTRESTO) 24-26 mg per tablet  1 tablet Oral BID Bensimhon, Shaune Pascal, MD   1 tablet at 05/18/19 0957  . senna (SENOKOT) tablet 8.6 mg  1 tablet Oral BID Mannam, Praveen, MD   8.6 mg at 05/17/19 0904  . sodium chloride flush (NS) 0.9 % injection 10-40 mL  10-40 mL Intracatheter PRN Erick Colace, NP      . spironolactone (ALDACTONE) tablet 12.5 mg  12.5 mg Oral Daily Erick Colace, NP   12.5 mg at 05/18/19 0958  . Warfarin - Pharmacist Dosing Inpatient   Does not apply q1800 Hassell Done,  Benjiman Core, [place] at 05/18/19 1800    Allergies as of 05/11/2019  . (No Known Allergies)    Family History  Problem Relation Age of Onset  . Heart attack Neg Hx     Social History   Socioeconomic History  . Marital status: Single    Spouse name: Not on file  . Number of children: Not on file  . Years of education: Not on file  . Highest education level: Not on file  Occupational History  . Not on file  Social Needs  . Financial resource strain: Not on file  . Food insecurity:    Worry: Not on file    Inability: Not on file  . Transportation needs:    Medical: Not on file    Non-medical: Not on file  Tobacco Use  . Smoking status: Never Smoker  . Smokeless tobacco: Never Used  Substance and Sexual Activity  . Alcohol use: Never    Alcohol/week: 0.0 standard drinks    Frequency: Never  . Drug use: Never  . Sexual activity: Not on file  Lifestyle  . Physical activity:    Days per week: Not on file    Minutes per session: Not on file  . Stress: Not on file  Relationships  . Social connections:    Talks on phone: Not on file    Gets together: Not on file    Attends religious service: Not on file    Active member of club or organization: Not on  file    Attends meetings of clubs or organizations: Not on file    Relationship status: Not on file  . Intimate partner violence:    Fear of current or ex partner: Not on file    Emotionally abused: Not on file    Physically abused: Not on file    Forced sexual activity: Not on file  Other Topics Concern  . Not on file  Social History Narrative  . Not on file    Review of Systems: Gen: Denies fever, sweats or chills. No weight loss.  CV: Denies chest pain or palpitations, edema resolved. Resp: Cough improving. See HPI. GI: Denies heartburn, dysphagia, stomach or lower abdominal pain. See HPI. GU : Denies urinary burning, blood in urine, increased urinary frequency or incontinence. MS: Complains of generalized body aches and weakness. Derm: Denies rash, itchiness, skin lesions or unhealing ulcers. Psych: Denies depression, anxiety, memory loss, suicidal ideation and confusion. Heme: Denies bruising, bleeding. Neuro:  Denies headaches, dizziness or paresthesias. Endo:  Denies any problems with DM, thyroid or adrenal function.  Physical Exam: Vital signs in last 24 hours: Temp:  [98.3 F (36.8 C)-99.8 F (37.7 C)] 98.3 F (36.8 C) (05/31 1216) Pulse Rate:  [44-90] 82 (05/31 1216) Resp:  [18-20] 20 (05/31 1216) BP: (106-152)/(65-94) 106/65 (05/31 1216) SpO2:  [93 %-95 %] 95 % (05/31 1216) Weight:  [115.5 kg] 115.5 kg (05/31 0559) Last BM Date: 05/16/19 General:   Alert,  well-developed, well-nourished, pleasant and cooperative in NAD. Head:  Normocephalic and atraumatic. Eyes:  Sclera clear, no icterus. Conjunctiva pink. Ears:  Normal auditory acuity. Nose:  No deformity, discharge or lesions. Mouth:  No deformity or lesions.   Neck:  Supple. Lungs: Clear throughout. Heart: Diminished S1,S2, no murmur. Abdomen: soft, nontender, no masses or organomegaly.  Rectal: thin streak black stool grossly heme positive, no mass but exam limited. Msk:  Symmetrical without gross  deformities. . Pulses:  Normal pulses noted. Extremities:  Without clubbing or edema. Neurologic:  Alert and  oriented x4;  grossly normal neurologically. Skin:  Intact without significant lesions or rashes.. Psych:  Alert and cooperative. Normal mood and affect.  Intake/Output from previous day: 05/30 0701 - 05/31 0700 In: 1873 [P.O.:1060; I.V.:613; IV Piggyback:200] Out: 8366 [Urine:3850] Intake/Output this shift: Total I/O In: 624.2 [P.O.:240; I.V.:184.2; IV Piggyback:200] Out: -   Lab Results: Recent Labs    05/18/19 0437 05/18/19 1416  WBC 16.9* 15.8*  HGB 9.5* 10.3*  HCT 29.1* 32.5*  PLT 254 284   BMET Recent Labs    05/16/19 0339 05/17/19 0356 05/18/19 0437  NA 141 139 140  K 5.2* 3.9 3.8  CL 104 102 103  CO2 22 27 27   GLUCOSE 141* 156* 131*  BUN 26* 36* 44*  CREATININE 1.38* 1.18 1.10  CALCIUM 9.4 9.1 8.8*   LFT No results for input(s): PROT, ALBUMIN, AST, ALT, ALKPHOS, BILITOT, BILIDIR, IBILI in the last 72 hours. PT/INR Recent Labs    05/17/19 0356 05/18/19 0437  LABPROT 15.7* 16.0*  INR 1.3* 1.3*    IMPRESSION/PLAN:  1. 58 y.o. male admitted 5/25 with acute respiratory failure and cardiogenic shock. ECHO found a LV apical thrombus which required anticoagulation. Also diagnosed with pseudomonas pneumonia. Overall, his cardiac and respiratory status improved and he was transferred to the floor. He remains on Heparin gtt and Coumadin 12.5mg  po was added as he will need oral anticoagulation prior to discharge. He developed melena for the past  3 days which he did not report until today. Hg dropped 12.6 to 9.5 today. INR 1.3. He is asymptomatic.  -clear liquids now then NPO after midnight -EGD tomorrow with Dr.  Havery Moros -Hold Coumadin for now -Hold Heparin gtt 6 hours prior to EGD -monitor H/H closely, transfuse for Hg  < 8 with cardiac history. -Protonix 40mg  IV Q 12 hrs  2. Acute respiratory failure resolved, pseudomonas pneumonia resolving   3. Significant cardiac hx, cardiogenic shock resolved  4. CKD  5. Rectal bleeding -will eventually need colonoscopy as outpatient Noralyn Pick  05/18/2019, 3:20 PM  GI ATTENDING  History, labs, x-rays reviewed. Patient personally seen and examined. Agree with comprehensive consultation note as outlined. Complex patient with significant recent acute medical problems superimposed on chronic problems as note. Now has evidence of hemodynamically stable upper GI bleeding and intermittent intermittent minor rectal bleeding on anticoagulation. Plan EGD tomorrow. The patient is high risk. The nature of the procedure, as well as the risks, benefits, and alternatives were carefully and thoroughly reviewed with the patient. Ample time for discussion and questions allowed. The patient understood, was satisfied, and agreed to proceed.Discussed with his cardiologist (DB). Will hold heparin 6 hours prior. Continue PPI.   Docia Chuck. Geri Seminole., M.D. [facility] Division of Gastroenterology

## 2019-05-18 NOTE — H&P (View-Only) (Signed)
[name]  PROGRESS NOTE    [name]  NKN:[id number] DOB: 1961-10-09 DOA: 05/11/2019 PCP: [name], MD   Brief Narrative:   Jonathon [place]atwood [name] a 58 y.o.malewith PMH of systolic heart failure (EF 15%), Aortic stenosis s/p replacement, ascending thoracic aortic aneurysm, pulmonary HTN, obesity, and HTN presentsto the emergency department for evaluation of shortness of breath. Symptoms have been worsening over the past 7days. He denies any fevers but has had some cough. He notes worsening swelling in the legs per EMS and reports that he has been compliant with his Lasix. Denies chest pain.EMS state that initially the patient appeared to have mild symptoms. He was placed on oxygen at home and allowed to gather several things around his room which she was able to walk and do. After walking, they report significantly worsening respiratory distress requiringnonrebreather.He noted significantly elevated blood pressures.They gave aspirin in route along with 1 nitroglycerin without significant change in symptoms.   Assessment & Plan:   Active Problems:   Acute systolic CHF (congestive heart failure) (HCC)   Cardiogenic shock (HCC)   Acute respiratory failure (HCC)   Pseudomonas pneumonia (HCC)   Acute hypoxic respiratory failure requiring intubation  - due to CHF exacerbation with flash pulmonary edema and Pseudomonas pneumonia - grew Pseudomonas in sputum - Successfully extubated 5/27 - Wean oxygen; currentl on 3.5 liters [place]  - cefepime started 5/28 to finish out an additional 8 days of abx tx for PSA  Acute systolic HF/Cardiogenic shock w/ BiV HF - Echo 05/11/19: EF 20-25%, mod decreased RVF - remains on heparin gtt; warfarin started (pharm dosing) - no BB d/t SA, AV nodal dysfxn - BiDil and aldactone - Cardiology feels he will eventually need pacemaker  Afib - per cards; will d/w EP about CRT pacing per their  note  LV apical thrombus - warfarin  CKD stage 2-3 - SCr is 1.18 today - on spironolactone, monitor Scr  DM2  - SSI; monitor glucose  Constipation  - senna  Will continue cefepime atleast through Monday. Appreciate cards assistance. Pharm dosing coumadin. He's taken a little drop in his Hgb since his last check. He's on heparin/coumadin. Let's repeat that lab.   Melena     - complains of melena; has had Hgb drop     - add protonix, hold tonight's coumadin, consult GI   DVT prophylaxis: Heparin Code Status: FULL   Disposition Plan: TBD   Consultants:   Cardiology  Antimicrobials:   Cefepime    Subjective: "How much longer do you think I'll be here, doc?"  Objective: Vitals:   05/18/19 0559 05/18/19 0602 05/18/19 1000 05/18/19 1216  BP:  (!) 140/94 112/89 106/65  Pulse:  73 (!) 44 82  Resp:  20 18 20   Temp:  99.8 F (37.7 C) 98.4 F (36.9 C) 98.3 F (36.8 C)  TempSrc:  Oral Oral Oral  SpO2:  93% 94% 95%  Weight: 115.5 kg     Height:        Intake/Output Summary (Last 24 hours) at 05/18/2019 1322 Last data filed at 05/18/2019 1300 Gross per 24 hour  Intake 1872.96 ml  Output 2250 ml  Net -377.04 ml   Filed Weights   05/16/19 0545 05/17/19 0447 05/18/19 0559  Weight: 115.3 kg 117.2 kg 115.5 kg    Examination:  General exam:58 y.o.maleAppears calm and comfortable  Respiratory system: Clear to auscultationanteriorly, some crackles at bases Cardiovascular system:S1 &S2 heard, RRR. 1/6 SEM,No JVD, rubs, gallops or clicks.BLE  edema Gastrointestinal system:Abdomen is nondistended, soft and nontender. No organomegaly or masses felt. Normal bowel sounds heard. Central nervous system:Alert and oriented. No focal neurological deficits. Extremities: Symmetric 5 x 5 power.    Data Reviewed: I have personally reviewed following labs and imaging studies.  CBC: Recent Labs  Lab 05/12/19 0943 05/13/19 0646 05/14/19 0400  05/15/19 0357 05/18/19 0437  WBC 12.7* 12.6* 9.5 13.3* 16.9*  HGB 14.7 13.4 13.7 12.5* 9.5*  HCT 44.3 41.9 43.2 39.5 29.1*  MCV 83.0 85.5 85.9 85.5 86.4  PLT 170 172 180 187 161   Basic Metabolic Panel: Recent Labs  Lab 05/12/19 0943 05/13/19 0646 05/13/19 1649 05/14/19 0400 05/15/19 0357 05/16/19 0339 05/17/19 0356 05/18/19 0437  NA 141 143  --  145 147* 141 139 140  K 4.3 4.2  --  4.2 3.6 5.2* 3.9 3.8  CL 107 108  --  105 106 104 102 103  CO2 18* 23  --  27 32 22 27 27   GLUCOSE 137* 123*  --  123* 102* 141* 156* 131*  BUN 26* 25*  --  41* 27* 26* 36* 44*  CREATININE 2.15* 1.90*  --  2.03* 1.53* 1.38* 1.18 1.10  CALCIUM 8.8* 9.0  --  9.2 9.0 9.4 9.1 8.8*  MG 1.7 2.0 2.3 2.3 2.2  --   --  1.8  PHOS 3.0 3.3 3.9 4.6 3.6  --   --   --    GFR: Estimated Creatinine Clearance: 94.6 mL/min (by C-G formula based on SCr of 1.1 mg/dL). Liver Function Tests: Recent Labs  Lab 05/14/19 0400 05/15/19 0357  AST 36 37  ALT 47* 47*  ALKPHOS 80 101  BILITOT 1.0 1.3*  PROT 6.5 6.4*  ALBUMIN 3.1* 2.9*   No results for input(s): LIPASE, AMYLASE in the last 168 hours. No results for input(s): AMMONIA in the last 168 hours. Coagulation Profile: Recent Labs  Lab 05/16/19 0339 05/17/19 0356 05/18/19 0437  INR 1.3* 1.3* 1.3*   Cardiac Enzymes: No results for input(s): CKTOTAL, CKMB, CKMBINDEX, TROPONINI in the last 168 hours. BNP (last 3 results) No results for input(s): PROBNP in the last 8760 hours. HbA1C: No results for input(s): HGBA1C in the last 72 hours. CBG: Recent Labs  Lab 05/17/19 1213 05/17/19 1656 05/17/19 2109 05/18/19 0608 05/18/19 1218  GLUCAP 128* 165* 167* 113* 140*   Lipid Profile: No results for input(s): CHOL, HDL, LDLCALC, TRIG, CHOLHDL, LDLDIRECT in the last 72 hours. Thyroid Function Tests: No results for input(s): TSH, T4TOTAL, FREET4, T3FREE, THYROIDAB in the last 72 hours. Anemia Panel: No results for input(s): VITAMINB12, FOLATE, FERRITIN,  TIBC, IRON, RETICCTPCT in the last 72 hours. Sepsis Labs: No results for input(s): PROCALCITON, LATICACIDVEN in the last 168 hours.  Recent Results (from the past 240 hour(s))  SARS Coronavirus 2 (CEPHEID- Performed in [place] hospital lab), Hosp Order     Status: None   Collection Time: 05/11/19  8:44 AM  Result Value Ref Range Status   SARS Coronavirus 2 NEGATIVE NEGATIVE Final    Comment: (NOTE) If result is NEGATIVE SARS-CoV-2 target nucleic acids are NOT DETECTED. The SARS-CoV-2 RNA is generally detectable in upper and lower  respiratory specimens during the acute phase of infection. The lowest  concentration of SARS-CoV-2 viral copies this assay can detect is 250  copies / mL. A negative result does not preclude SARS-CoV-2 infection  and should not be used as the sole basis for treatment or other  patient management decisions.  A negative result may occur with  improper specimen collection / handling, submission of specimen other  than nasopharyngeal swab, presence of viral mutation(s) within the  areas targeted by this assay, and inadequate number of viral copies  (<250 copies / mL). A negative result must be combined with clinical  observations, patient history, and epidemiological information. If result is POSITIVE SARS-CoV-2 target nucleic acids are DETECTED. The SARS-CoV-2 RNA is generally detectable in upper and lower  respiratory specimens dur ing the acute phase of infection.  Positive  results are indicative of active infection with SARS-CoV-2.  Clinical  correlation with patient history and other diagnostic information is  necessary to determine patient infection status.  Positive results do  not rule out bacterial infection or co-infection with other viruses. If result is PRESUMPTIVE POSTIVE SARS-CoV-2 nucleic acids MAY BE PRESENT.   A presumptive positive result was obtained on the submitted specimen  and confirmed on repeat testing.  While 2019 novel  coronavirus  (SARS-CoV-2) nucleic acids may be present in the submitted sample  additional confirmatory testing may be necessary for epidemiological  and / or clinical management purposes  to differentiate between  SARS-CoV-2 and other Sarbecovirus currently known to infect humans.  If clinically indicated additional testing with an alternate test  methodology [phone]) is advised. The SARS-CoV-2 RNA is generally  detectable in upper and lower respiratory sp ecimens during the acute  phase of infection. The expected result is Negative. Fact Sheet for Patients:  StrictlyIdeas.no Fact Sheet for Healthcare Providers: BankingDealers.co.za This test is not yet approved or cleared by the [place] FDA and has been authorized for detection and/or diagnosis of SARS-CoV-2 by FDA under an Emergency Use Authorization (EUA).  This EUA will remain in effect (meaning this test can be used) for the duration of the COVID-19 declaration under Section 564(b)(1) of the Act, 21 U.S.C. section 360bbb-3(b)(1), unless the authorization is terminated or revoked sooner. Performed at [facility], [place] [address] [place], [place] [address]   Respiratory Panel by PCR     Status: None   Collection Time: 05/11/19 10:15 AM  Result Value Ref Range Status   Adenovirus NOT DETECTED NOT DETECTED Final   Coronavirus 229E NOT DETECTED NOT DETECTED Final    Comment: (NOTE) The Coronavirus on the Respiratory Panel, DOES NOT test for the novel  Coronavirus (2019 nCoV)    Coronavirus HKU1 NOT DETECTED NOT DETECTED Final   Coronavirus NL63 NOT DETECTED NOT DETECTED Final   Coronavirus OC43 NOT DETECTED NOT DETECTED Final   Metapneumovirus NOT DETECTED NOT DETECTED Final   Rhinovirus / Enterovirus NOT DETECTED NOT DETECTED Final   Influenza A NOT DETECTED NOT DETECTED Final   Influenza B NOT DETECTED NOT DETECTED Final   Parainfluenza Virus 1 NOT DETECTED NOT  DETECTED Final   Parainfluenza Virus 2 NOT DETECTED NOT DETECTED Final   Parainfluenza Virus 3 NOT DETECTED NOT DETECTED Final   Parainfluenza Virus 4 NOT DETECTED NOT DETECTED Final   Respiratory Syncytial Virus NOT DETECTED NOT DETECTED Final   Bordetella pertussis NOT DETECTED NOT DETECTED Final   Chlamydophila pneumoniae NOT DETECTED NOT DETECTED Final   Mycoplasma pneumoniae NOT DETECTED NOT DETECTED Final    Comment: Performed at [facility], [place]. [address], [place], [place] [address]  MRSA PCR Screening     Status: Abnormal   Collection Time: 05/11/19 12:03 PM  Result Value Ref Range Status   MRSA by PCR POSITIVE (A) NEGATIVE Final  Comment:        The GeneXpert MRSA Assay (FDA approved for NASAL specimens only), is one component of a comprehensive MRSA colonization surveillance program. It is not intended to diagnose MRSA infection nor to guide or monitor treatment for MRSA infections. RESULT CALLED TO, READ BACK BY AND VERIFIED WITH: RN M CROSS [id number] AT 5643 BY CM Performed at [facility], [place] [address], [place], [place] [address]   Culture, blood (Routine X 2) w Reflex to ID Panel     Status: None   Collection Time: 05/11/19 12:17 PM  Result Value Ref Range Status   Specimen Description BLOOD BLOOD RIGHT HAND  Final   Special Requests AEROBIC BOTTLE ONLY Blood Culture adequate volume  Final   Culture   Final    NO GROWTH 5 DAYS Performed at [facility], [place] [address], [place], [place] [address]    Report Status 05/16/2019 FINAL  Final  Culture, blood (Routine X 2) w Reflex to ID Panel     Status: None   Collection Time: 05/11/19 12:17 PM  Result Value Ref Range Status   Specimen Description BLOOD RIGHT ANTECUBITAL  Final   Special Requests   Final    AEROBIC BOTTLE ONLY Blood Culture results may not be optimal due to an inadequate volume of blood received in culture bottles   Culture   Final    NO GROWTH 5 DAYS Performed at [facility], [place] [address] [place], [place] [address]    Report Status 05/16/2019 FINAL  Final  Culture, respiratory (non-expectorated)     Status: None   Collection Time: 05/11/19 11:57 PM  Result Value Ref Range Status   Specimen Description TRACHEAL ASPIRATE  Final   Special Requests NONE  Final   Gram Stain   Final    ABUNDANT WBC PRESENT, PREDOMINANTLY PMN RARE GRAM NEGATIVE RODS RARE GRAM POSITIVE COCCI Performed at [facility], [place] [address], [place], [place] [address]    Culture RARE PSEUDOMONAS AERUGINOSA  Final   Report Status 05/15/2019 FINAL  Final   Organism ID, Bacteria PSEUDOMONAS AERUGINOSA  Final      Susceptibility   Pseudomonas aeruginosa - MIC*    CEFTAZIDIME 8 SENSITIVE Sensitive     CIPROFLOXACIN <=0.25 SENSITIVE Sensitive     GENTAMICIN <=1 SENSITIVE Sensitive     IMIPENEM 2 SENSITIVE Sensitive     PIP/TAZO 64 SENSITIVE Sensitive     CEFEPIME 4 SENSITIVE Sensitive     * RARE PSEUDOMONAS AERUGINOSA         Radiology Studies: No results found.      Scheduled Meds:  allopurinol  200 mg Oral Daily   feeding supplement (ENSURE ENLIVE)  237 mL Oral BID BM   furosemide  40 mg Oral BID   guaiFENesin  600 mg Oral BID   insulin aspart  0-20 Units Subcutaneous TID AC & HS   isosorbide-hydrALAZINE  1 tablet Oral TID   predniSONE  40 mg Oral Q breakfast   sacubitril-valsartan  1 tablet Oral BID   senna  1 tablet Oral BID   spironolactone  12.5 mg Oral Daily   warfarin  12.5 mg Oral ONCE-1800   Warfarin - Pharmacist Dosing Inpatient   Does not apply q1800   Continuous Infusions:  ceFEPime (MAXIPIME) IV 2 g (05/18/19 0609)   heparin 2,100 Units/hr (05/18/19 0613)     LOS: 7 days    Time spent: 25 minutes spent  in the coordination of care today.    Jonnie Finner, DO [facility] Pager [phone]  If 7PM-7AM, please contact night-coverage [email or web address] Password [email or web address] 05/18/2019, 1:22 PM

## 2019-05-18 NOTE — Progress Notes (Addendum)
[name]  PROGRESS NOTE    DEMIR TITSWORTH  VHQ:[id number] DOB: 1961-02-15 DOA: 05/11/2019 PCP: [name], MD   Brief Narrative:   Jonathon Vonderhaar Pattersonis a 58 y.o.malewith PMH of systolic heart failure (EF 15%), Aortic stenosis s/p replacement, ascending thoracic aortic aneurysm, pulmonary HTN, obesity, and HTN presentsto the emergency department for evaluation of shortness of breath. Symptoms have been worsening over the past 7days. He denies any fevers but has had some cough. He notes worsening swelling in the legs per EMS and reports that he has been compliant with his Lasix. Denies chest pain.EMS state that initially the patient appeared to have mild symptoms. He was placed on oxygen at home and allowed to gather several things around his room which she was able to walk and do. After walking, they report significantly worsening respiratory distress requiringnonrebreather.He noted significantly elevated blood pressures.They gave aspirin in route along with 1 nitroglycerin without significant change in symptoms.   Assessment & Plan:   Active Problems:   Acute systolic CHF (congestive heart failure) (HCC)   Cardiogenic shock (HCC)   Acute respiratory failure (HCC)   Pseudomonas pneumonia (HCC)   Acute hypoxic respiratory failure requiring intubation  - due to CHF exacerbation with flash pulmonary edema and Pseudomonas pneumonia - grew Pseudomonas in sputum - Successfully extubated 5/27 - Wean oxygen; currentl on 3.5 liters [place]  - cefepime started 5/28 to finish out an additional 8 days of abx tx for PSA  Acute systolic HF/Cardiogenic shock w/ BiV HF - Echo 05/11/19: EF 20-25%, mod decreased RVF - remains on heparin gtt; warfarin started (pharm dosing) - no BB d/t SA, AV nodal dysfxn - BiDil and aldactone - Cardiology feels he will eventually need pacemaker  Afib - per cards; will d/w EP about CRT pacing per their  note  LV apical thrombus - warfarin  CKD stage 2-3 - SCr is 1.18 today - on spironolactone, monitor Scr  DM2  - SSI; monitor glucose  Constipation  - senna  Will continue cefepime atleast through Monday. Appreciate cards assistance. Pharm dosing coumadin. He's taken a little drop in his Hgb since his last check. He's on heparin/coumadin. Let's repeat that lab.   Melena     - complains of melena; has had Hgb drop     - add protonix, hold tonight's coumadin, consult GI   DVT prophylaxis: Heparin Code Status: FULL   Disposition Plan: TBD   Consultants:   Cardiology  Antimicrobials:   Cefepime    Subjective: "How much longer do you think I'll be here, doc?"  Objective: Vitals:   05/18/19 0559 05/18/19 0602 05/18/19 1000 05/18/19 1216  BP:  (!) 140/94 112/89 106/65  Pulse:  73 (!) 44 82  Resp:  20 18 20   Temp:  99.8 F (37.7 C) 98.4 F (36.9 C) 98.3 F (36.8 C)  TempSrc:  Oral Oral Oral  SpO2:  93% 94% 95%  Weight: 115.5 kg     Height:        Intake/Output Summary (Last 24 hours) at 05/18/2019 1322 Last data filed at 05/18/2019 1300 Gross per 24 hour  Intake 1872.96 ml  Output 2250 ml  Net -377.04 ml   Filed Weights   05/16/19 0545 05/17/19 0447 05/18/19 0559  Weight: 115.3 kg 117.2 kg 115.5 kg    Examination:  General exam:58 y.o.maleAppears calm and comfortable  Respiratory system: Clear to auscultationanteriorly, some crackles at bases Cardiovascular system:S1 &S2 heard, RRR. 1/6 SEM,No JVD, rubs, gallops or clicks.BLE  edema Gastrointestinal system:Abdomen is nondistended, soft and nontender. No organomegaly or masses felt. Normal bowel sounds heard. Central nervous system:Alert and oriented. No focal neurological deficits. Extremities: Symmetric 5 x 5 power.    Data Reviewed: I have personally reviewed following labs and imaging studies.  CBC: Recent Labs  Lab 05/12/19 0943 05/13/19 0646 05/14/19 0400  05/15/19 0357 05/18/19 0437  WBC 12.7* 12.6* 9.5 13.3* 16.9*  HGB 14.7 13.4 13.7 12.5* 9.5*  HCT 44.3 41.9 43.2 39.5 29.1*  MCV 83.0 85.5 85.9 85.5 86.4  PLT 170 172 180 187 834   Basic Metabolic Panel: Recent Labs  Lab 05/12/19 0943 05/13/19 0646 05/13/19 1649 05/14/19 0400 05/15/19 0357 05/16/19 0339 05/17/19 0356 05/18/19 0437  NA 141 143  --  145 147* 141 139 140  K 4.3 4.2  --  4.2 3.6 5.2* 3.9 3.8  CL 107 108  --  105 106 104 102 103  CO2 18* 23  --  27 32 22 27 27   GLUCOSE 137* 123*  --  123* 102* 141* 156* 131*  BUN 26* 25*  --  41* 27* 26* 36* 44*  CREATININE 2.15* 1.90*  --  2.03* 1.53* 1.38* 1.18 1.10  CALCIUM 8.8* 9.0  --  9.2 9.0 9.4 9.1 8.8*  MG 1.7 2.0 2.3 2.3 2.2  --   --  1.8  PHOS 3.0 3.3 3.9 4.6 3.6  --   --   --    GFR: Estimated Creatinine Clearance: 94.6 mL/min (by C-G formula based on SCr of 1.1 mg/dL). Liver Function Tests: Recent Labs  Lab 05/14/19 0400 05/15/19 0357  AST 36 37  ALT 47* 47*  ALKPHOS 80 101  BILITOT 1.0 1.3*  PROT 6.5 6.4*  ALBUMIN 3.1* 2.9*   No results for input(s): LIPASE, AMYLASE in the last 168 hours. No results for input(s): AMMONIA in the last 168 hours. Coagulation Profile: Recent Labs  Lab 05/16/19 0339 05/17/19 0356 05/18/19 0437  INR 1.3* 1.3* 1.3*   Cardiac Enzymes: No results for input(s): CKTOTAL, CKMB, CKMBINDEX, TROPONINI in the last 168 hours. BNP (last 3 results) No results for input(s): PROBNP in the last 8760 hours. HbA1C: No results for input(s): HGBA1C in the last 72 hours. CBG: Recent Labs  Lab 05/17/19 1213 05/17/19 1656 05/17/19 2109 05/18/19 0608 05/18/19 1218  GLUCAP 128* 165* 167* 113* 140*   Lipid Profile: No results for input(s): CHOL, HDL, LDLCALC, TRIG, CHOLHDL, LDLDIRECT in the last 72 hours. Thyroid Function Tests: No results for input(s): TSH, T4TOTAL, FREET4, T3FREE, THYROIDAB in the last 72 hours. Anemia Panel: No results for input(s): VITAMINB12, FOLATE, FERRITIN,  TIBC, IRON, RETICCTPCT in the last 72 hours. Sepsis Labs: No results for input(s): PROCALCITON, LATICACIDVEN in the last 168 hours.  Recent Results (from the past 240 hour(s))  SARS Coronavirus 2 (CEPHEID- Performed in [place] hospital lab), Hosp Order     Status: None   Collection Time: 05/11/19  8:44 AM  Result Value Ref Range Status   SARS Coronavirus 2 NEGATIVE NEGATIVE Final    Comment: (NOTE) If result is NEGATIVE SARS-CoV-2 target nucleic acids are NOT DETECTED. The SARS-CoV-2 RNA is generally detectable in upper and lower  respiratory specimens during the acute phase of infection. The lowest  concentration of SARS-CoV-2 viral copies this assay can detect is 250  copies / mL. A negative result does not preclude SARS-CoV-2 infection  and should not be used as the sole basis for treatment or other  patient management decisions.  A negative result may occur with  improper specimen collection / handling, submission of specimen other  than nasopharyngeal swab, presence of viral mutation(s) within the  areas targeted by this assay, and inadequate number of viral copies  (<250 copies / mL). A negative result must be combined with clinical  observations, patient history, and epidemiological information. If result is POSITIVE SARS-CoV-2 target nucleic acids are DETECTED. The SARS-CoV-2 RNA is generally detectable in upper and lower  respiratory specimens dur ing the acute phase of infection.  Positive  results are indicative of active infection with SARS-CoV-2.  Clinical  correlation with patient history and other diagnostic information is  necessary to determine patient infection status.  Positive results do  not rule out bacterial infection or co-infection with other viruses. If result is PRESUMPTIVE POSTIVE SARS-CoV-2 nucleic acids MAY BE PRESENT.   A presumptive positive result was obtained on the submitted specimen  and confirmed on repeat testing.  While 2019 novel  coronavirus  (SARS-CoV-2) nucleic acids may be present in the submitted sample  additional confirmatory testing may be necessary for epidemiological  and / or clinical management purposes  to differentiate between  SARS-CoV-2 and other Sarbecovirus currently known to infect humans.  If clinically indicated additional testing with an alternate test  methodology [phone]) is advised. The SARS-CoV-2 RNA is generally  detectable in upper and lower respiratory sp ecimens during the acute  phase of infection. The expected result is Negative. Fact Sheet for Patients:  StrictlyIdeas.no Fact Sheet for Healthcare Providers: BankingDealers.co.za This test is not yet approved or cleared by the [place] FDA and has been authorized for detection and/or diagnosis of SARS-CoV-2 by FDA under an Emergency Use Authorization (EUA).  This EUA will remain in effect (meaning this test can be used) for the duration of the COVID-19 declaration under Section 564(b)(1) of the Act, 21 U.S.C. section 360bbb-3(b)(1), unless the authorization is terminated or revoked sooner. Performed at [facility], [place] [address] [place], [place] [address]   Respiratory Panel by PCR     Status: None   Collection Time: 05/11/19 10:15 AM  Result Value Ref Range Status   Adenovirus NOT DETECTED NOT DETECTED Final   Coronavirus 229E NOT DETECTED NOT DETECTED Final    Comment: (NOTE) The Coronavirus on the Respiratory Panel, DOES NOT test for the novel  Coronavirus (2019 nCoV)    Coronavirus HKU1 NOT DETECTED NOT DETECTED Final   Coronavirus NL63 NOT DETECTED NOT DETECTED Final   Coronavirus OC43 NOT DETECTED NOT DETECTED Final   Metapneumovirus NOT DETECTED NOT DETECTED Final   Rhinovirus / Enterovirus NOT DETECTED NOT DETECTED Final   Influenza A NOT DETECTED NOT DETECTED Final   Influenza B NOT DETECTED NOT DETECTED Final   Parainfluenza Virus 1 NOT DETECTED NOT  DETECTED Final   Parainfluenza Virus 2 NOT DETECTED NOT DETECTED Final   Parainfluenza Virus 3 NOT DETECTED NOT DETECTED Final   Parainfluenza Virus 4 NOT DETECTED NOT DETECTED Final   Respiratory Syncytial Virus NOT DETECTED NOT DETECTED Final   Bordetella pertussis NOT DETECTED NOT DETECTED Final   Chlamydophila pneumoniae NOT DETECTED NOT DETECTED Final   Mycoplasma pneumoniae NOT DETECTED NOT DETECTED Final    Comment: Performed at [facility], [place]. [address], [place], [place] [address]  MRSA PCR Screening     Status: Abnormal   Collection Time: 05/11/19 12:03 PM  Result Value Ref Range Status   MRSA by PCR POSITIVE (A) NEGATIVE Final  Comment:        The GeneXpert MRSA Assay (FDA approved for NASAL specimens only), is one component of a comprehensive MRSA colonization surveillance program. It is not intended to diagnose MRSA infection nor to guide or monitor treatment for MRSA infections. RESULT CALLED TO, READ BACK BY AND VERIFIED WITH: RN M CROSS [id number] AT [id number] BY CM Performed at [facility], [place] [address], [place], [place] [address]   Culture, blood (Routine X 2) w Reflex to ID Panel     Status: None   Collection Time: 05/11/19 12:17 PM  Result Value Ref Range Status   Specimen Description BLOOD BLOOD RIGHT HAND  Final   Special Requests AEROBIC BOTTLE ONLY Blood Culture adequate volume  Final   Culture   Final    NO GROWTH 5 DAYS Performed at [facility], [place] [address] [place], [place] [address]    Report Status 05/16/2019 FINAL  Final  Culture, blood (Routine X 2) w Reflex to ID Panel     Status: None   Collection Time: 05/11/19 12:17 PM  Result Value Ref Range Status   Specimen Description BLOOD RIGHT ANTECUBITAL  Final   Special Requests   Final    AEROBIC BOTTLE ONLY Blood Culture results may not be optimal due to an inadequate volume of blood received in culture bottles   Culture   Final    NO GROWTH 5 DAYS Performed at [facility], [place] [address] [place], [place] [address]    Report Status 05/16/2019 FINAL  Final  Culture, respiratory (non-expectorated)     Status: None   Collection Time: 05/11/19 11:57 PM  Result Value Ref Range Status   Specimen Description TRACHEAL ASPIRATE  Final   Special Requests NONE  Final   Gram Stain   Final    ABUNDANT WBC PRESENT, PREDOMINANTLY PMN RARE GRAM NEGATIVE RODS RARE GRAM POSITIVE COCCI Performed at [facility], [place] [address], [place], [place] [address]    Culture RARE PSEUDOMONAS AERUGINOSA  Final   Report Status 05/15/2019 FINAL  Final   Organism ID, Bacteria PSEUDOMONAS AERUGINOSA  Final      Susceptibility   Pseudomonas aeruginosa - MIC*    CEFTAZIDIME 8 SENSITIVE Sensitive     CIPROFLOXACIN <=0.25 SENSITIVE Sensitive     GENTAMICIN <=1 SENSITIVE Sensitive     IMIPENEM 2 SENSITIVE Sensitive     PIP/TAZO 64 SENSITIVE Sensitive     CEFEPIME 4 SENSITIVE Sensitive     * RARE PSEUDOMONAS AERUGINOSA         Radiology Studies: No results found.      Scheduled Meds:  allopurinol  200 mg Oral Daily   feeding supplement (ENSURE ENLIVE)  237 mL Oral BID BM   furosemide  40 mg Oral BID   guaiFENesin  600 mg Oral BID   insulin aspart  0-20 Units Subcutaneous TID AC & HS   isosorbide-hydrALAZINE  1 tablet Oral TID   predniSONE  40 mg Oral Q breakfast   sacubitril-valsartan  1 tablet Oral BID   senna  1 tablet Oral BID   spironolactone  12.5 mg Oral Daily   warfarin  12.5 mg Oral ONCE-1800   Warfarin - Pharmacist Dosing Inpatient   Does not apply q1800   Continuous Infusions:  ceFEPime (MAXIPIME) IV 2 g (05/18/19 0609)   heparin 2,100 Units/hr (05/18/19 0613)     LOS: 7 days    Time spent: 25 minutes spent  in the coordination of care today.    Jonnie Finner, DO [facility] Pager [phone]  If 7PM-7AM, please contact night-coverage [email or web address] Password [email or web address] 05/18/2019, 1:22 PM

## 2019-05-18 NOTE — Progress Notes (Signed)
Pharmacy Antibiotic Note  [name] is a 58 y.o. male admitted on 05/11/2019 with pneumonia.  Pharmacy has been consulted for cefepime dosing, was on ceftriaxone and azithromycin but escalated coverage 5/28 due to pseudomonas growing in respiratory culture.   WBC up 16.9, Tmax/24h 99.8, CrCL ~ 66 , scr 1.53  Plan: Cefepime 2g q8h- consider stopping after 5 days? F/u Cxs, clinical status, renal function, LOT/de-escalation,   Height: 5\' 11"  (180.3 cm) Weight: 254 lb 11.2 oz (115.5 kg) IBW/kg (Calculated) : 75.3  Temp (24hrs), Avg:99.3 F (37.4 C), Min:98.9 F (37.2 C), Max:99.8 F (37.7 C)  Recent Labs  Lab 05/11/19 1217 05/12/19 0943 05/13/19 0646 05/14/19 0400 05/15/19 0357 05/16/19 0339 05/17/19 0356 05/18/19 0437  WBC  --  12.7* 12.6* 9.5 13.3*  --   --  16.9*  CREATININE  --  2.15* 1.90* 2.03* 1.53* 1.38* 1.18 1.10  LATICACIDVEN 2.6*  --   --   --   --   --   --   --     Estimated Creatinine Clearance: 94.6 mL/min (by C-G formula based on SCr of 1.1 mg/dL).    No Known Allergies  Antimicrobials this admission: Cefepime 5/28 >> CTX 5/24 >> 5/28 Azithro 5/24 >> 5/28  Dose adjustments this admission: n/a  Microbiology results: CTX 5/24 >> 5/30 Azithro 5/24 >> 5/28 Cefepime 2mg  Q8h 5/28>trach- prob not true pna concider stop at dc or 5days of tx 6/3 5/24 COVID >> neg 5/24 Resp panel >> neg 5/24 Resp Cx >> PSA (pan sens) 5/24 BCx >> neg 5/24 MRSA PCR - positive  Thank you for allowing pharmacy to be a part of this patient's care.  Marguerite Olea, [facility] Clinical Pharmacist Phone [phone]  05/18/2019 7:36 AM

## 2019-05-18 NOTE — Plan of Care (Signed)
Patient stable, discussed POC with patient, agreeable with plan. Clear liquid diet tolerated well, denies question/concerns at this time.

## 2019-05-19 ENCOUNTER — Encounter (HOSPITAL_COMMUNITY): Payer: Self-pay | Admitting: *Deleted

## 2019-05-19 ENCOUNTER — Inpatient Hospital Stay (HOSPITAL_COMMUNITY): Payer: Medicare Other | Admitting: Anesthesiology

## 2019-05-19 ENCOUNTER — Encounter (HOSPITAL_COMMUNITY)
Admission: EM | Disposition: A | Discharge status: Home / Self Care | Payer: Self-pay | Source: Home / Self Care | Attending: Internal Medicine

## 2019-05-19 DIAGNOSIS — K921 Melena: Secondary | ICD-10-CM

## 2019-05-19 DIAGNOSIS — K250 Acute gastric ulcer with hemorrhage: Secondary | ICD-10-CM

## 2019-05-19 DIAGNOSIS — K644 Residual hemorrhoidal skin tags: Secondary | ICD-10-CM

## 2019-05-19 DIAGNOSIS — K648 Other hemorrhoids: Secondary | ICD-10-CM

## 2019-05-19 DIAGNOSIS — I480 Paroxysmal atrial fibrillation: Secondary | ICD-10-CM

## 2019-05-19 HISTORY — PX: ESOPHAGOGASTRODUODENOSCOPY (EGD) WITH PROPOFOL: SHX5813

## 2019-05-19 HISTORY — PX: HEMOSTASIS CLIP PLACEMENT: SHX6857

## 2019-05-19 LAB — RENAL FUNCTION PANEL
Albumin: 2.5 g/dL — ABNORMAL LOW (ref 3.5–5.0)
Anion gap: 8 (ref 5–15)
BUN: 31 mg/dL — ABNORMAL HIGH (ref 6–20)
CO2: 26 mmol/L (ref 22–32)
Calcium: 8.9 mg/dL (ref 8.9–10.3)
Chloride: 104 mmol/L (ref 98–111)
Creatinine, Ser: 1.16 mg/dL (ref 0.61–1.24)
GFR calc Af Amer: >60 mL/min (ref >60)
GFR calc non Af Amer: >60 mL/min (ref >60)
Glucose, Bld: 131 mg/dL — ABNORMAL HIGH (ref 70–99)
Phosphorus: 2.9 mg/dL (ref 2.5–4.6)
Potassium: 3.8 mmol/L (ref 3.5–5.1)
Sodium: 138 mmol/L (ref 135–145)

## 2019-05-19 LAB — HEPARIN LEVEL (UNFRACTIONATED): Heparin Unfractionated: 0.73 IU/mL — ABNORMAL HIGH (ref 0.30–0.70)

## 2019-05-19 LAB — HEMOGLOBIN AND HEMATOCRIT, BLOOD
HCT: 29.3 % — ABNORMAL LOW (ref 39.0–52.0)
Hemoglobin: 9.5 g/dL — ABNORMAL LOW (ref 13.0–17.0)

## 2019-05-19 LAB — GLUCOSE, CAPILLARY
Glucose-Capillary: 121 mg/dL — ABNORMAL HIGH (ref 70–99)
Glucose-Capillary: 162 mg/dL — ABNORMAL HIGH (ref 70–99)
Glucose-Capillary: 148 mg/dL — ABNORMAL HIGH (ref 70–99)

## 2019-05-19 LAB — PROTIME-INR
INR: 1.6 — ABNORMAL HIGH (ref 0.8–1.2)
Prothrombin Time: 18.5 seconds — ABNORMAL HIGH (ref 11.4–15.2)

## 2019-05-19 LAB — CBC
HCT: 26.9 % — ABNORMAL LOW (ref 39.0–52.0)
Hemoglobin: 8.6 g/dL — ABNORMAL LOW (ref 13.0–17.0)
MCH: 27.2 pg (ref 26.0–34.0)
MCHC: 32 g/dL (ref 30.0–36.0)
MCV: 85.1 fL (ref 80.0–100.0)
Platelets: 282 10*3/uL (ref 150–400)
RBC: 3.16 MIL/uL — ABNORMAL LOW (ref 4.22–5.81)
RDW: 14.7 % (ref 11.5–15.5)
WBC: 20.3 10*3/uL — ABNORMAL HIGH (ref 4.0–10.5)
nRBC: 0.3 % — ABNORMAL HIGH (ref 0.0–0.2)

## 2019-05-19 LAB — MAGNESIUM: Magnesium: 1.9 mg/dL (ref 1.7–2.4)

## 2019-05-19 SURGERY — ESOPHAGOGASTRODUODENOSCOPY (EGD) WITH PROPOFOL
Anesthesia: Monitor Anesthesia Care

## 2019-05-19 MED ORDER — SODIUM CHLORIDE 0.9 % IV SOLN
8.0000 mg/h | INTRAVENOUS | Status: AC
  Administered 2019-05-19 – 2019-05-21 (×5): 8 mg/h via INTRAVENOUS
  Filled 2019-05-19 (×7): qty 80

## 2019-05-19 MED ORDER — PANTOPRAZOLE SODIUM 40 MG IV SOLR: 40.0000 mg | Freq: Two times a day (BID) | INTRAVENOUS | Status: DC

## 2019-05-19 MED ORDER — PROPOFOL 10 MG/ML IV BOLUS
INTRAVENOUS | Status: DC | PRN
  Administered 2019-05-19: 30 mg via INTRAVENOUS

## 2019-05-19 MED ORDER — HEPARIN (PORCINE) 25000 UT/250ML-% IV SOLN
1800.0000 [IU]/h | INTRAVENOUS | Status: DC
  Administered 2019-05-19: 2100 [IU]/h via INTRAVENOUS
  Administered 2019-05-20: 2000 [IU]/h via INTRAVENOUS
  Administered 2019-05-20: 2100 [IU]/h via INTRAVENOUS
  Administered 2019-05-21: 1800 [IU]/h via INTRAVENOUS
  Filled 2019-05-19 (×4): qty 250

## 2019-05-19 MED ORDER — PHENYLEPHRINE 40 MCG/ML (10ML) SYRINGE FOR IV PUSH (FOR BLOOD PRESSURE SUPPORT)
PREFILLED_SYRINGE | INTRAVENOUS | Status: DC | PRN
  Administered 2019-05-19 (×2): 120 ug via INTRAVENOUS

## 2019-05-19 MED ORDER — HEPARIN (PORCINE) 25000 UT/250ML-% IV SOLN: 2100.0000 [IU]/h | INTRAVENOUS | Status: DC

## 2019-05-19 MED ORDER — SODIUM CHLORIDE 0.9 % IV SOLN
INTRAVENOUS | Status: DC | PRN
  Administered 2019-05-19: 09:00:00 via INTRAVENOUS

## 2019-05-19 MED ORDER — SODIUM CHLORIDE 0.9 % IV SOLN
80.0000 mg | Freq: Once | INTRAVENOUS | Status: AC
  Filled 2019-05-19: qty 80

## 2019-05-19 MED ORDER — HYDROCORTISONE (PERIANAL) 2.5 % EX CREA
TOPICAL_CREAM | Freq: Two times a day (BID) | CUTANEOUS | Status: DC
  Administered 2019-05-19 – 2019-05-20 (×3): via RECTAL
  Administered 2019-05-20: 1 via RECTAL
  Administered 2019-05-21 – 2019-05-23 (×4): via RECTAL
  Filled 2019-05-19: qty 28.35

## 2019-05-19 MED ORDER — SODIUM CHLORIDE 0.9 % IV SOLN
INTRAVENOUS | Status: DC
  Administered 2019-05-19: 15:00:00 via INTRAVENOUS

## 2019-05-19 MED ORDER — PROPOFOL 500 MG/50ML IV EMUL
INTRAVENOUS | Status: DC | PRN
  Administered 2019-05-19: 75 ug/kg/min via INTRAVENOUS

## 2019-05-19 MED ORDER — BUTAMBEN-TETRACAINE-BENZOCAINE 2-2-14 % EX AERO
INHALATION_SPRAY | CUTANEOUS | Status: DC | PRN
  Administered 2019-05-19: 09:00:00 2 via TOPICAL

## 2019-05-19 SURGICAL SUPPLY — 15 items

## 2019-05-19 NOTE — Plan of Care (Signed)
  Problem: Education: Goal: Knowledge of General Education information will improve Description Including pain rating scale, medication(s)/side effects and non-pharmacologic comfort measures Outcome: Progressing   Problem: Health Behavior/Discharge Planning: Goal: Ability to manage health-related needs will improve Outcome: Progressing   Problem: Clinical Measurements: Goal: Ability to maintain clinical measurements within normal limits will improve Outcome: Progressing Goal: Will remain free from infection Outcome: Progressing Goal: Diagnostic test results will improve Outcome: Progressing Goal: Respiratory complications will improve Outcome: Progressing Goal: Cardiovascular complication will be avoided Outcome: Progressing   Problem: Activity: Goal: Risk for activity intolerance will decrease Outcome: Progressing   Problem: Coping: Goal: Level of anxiety will decrease Outcome: Progressing   Problem: Elimination: Goal: Will not experience complications related to bowel motility Outcome: Progressing Goal: Will not experience complications related to urinary retention Outcome: Progressing   Problem: Pain Managment: Goal: General experience of comfort will improve Outcome: Progressing   Problem: Activity: Goal: Capacity to carry out activities will improve Outcome: Progressing   Problem: Cardiac: Goal: Ability to achieve and maintain adequate cardiopulmonary perfusion will improve Outcome: Progressing

## 2019-05-19 NOTE — Interval H&P Note (Signed)
History and Physical Interval Note:  05/19/2019 9:01 AM  [name]  has presented today for surgery, with the diagnosis of anemia, melena.  The various methods of treatment have been discussed with the patient and family. After consideration of risks, benefits and other options for treatment, the patient has consented to  Procedure(s): ESOPHAGOGASTRODUODENOSCOPY (EGD) WITH PROPOFOL (N/A) as a surgical intervention.  The patient's history has been reviewed, patient examined, no change in status, stable for surgery.  I have reviewed the patient's chart and labs.  Questions were answered to the patient's satisfaction.     Silvano Rusk

## 2019-05-19 NOTE — Progress Notes (Signed)
Physical Therapy Treatment Patient Details Name: [name] MRN: [id number] DOB: 07-04-1961 Today's Date: 05/19/2019    History of Present Illness Pt adm with acute respiratory failure due to decompensated heart failure. Intubated 5/24 - 5/28. PMH - CHF, AVR, HTN, obesity, CKD    PT Comments    Pt agreeable to ambulation, this afternoon. Pt is making good progress towards his goals, however is limited in safe mobility by mild instability and decreased safety awareness. Pt is supervision for bed mobility, min guard for transfers and ambulation of 450 feet without AD. Despite increased mobility would benefit from HHPT to work on balance. PT will continue to follow acutely.    Follow Up Recommendations  Home health PT;Supervision/Assistance - 24 hour     Equipment Recommendations  None recommended by PT       Precautions / Restrictions Precautions Precautions: Fall Restrictions Weight Bearing Restrictions: No    Mobility  Bed Mobility Overal bed mobility: Needs Assistance Bed Mobility: Supine to Sit     Supine to sit: Supervision;HOB elevated     General bed mobility comments: supervision for safety,   Transfers Overall transfer level: Needs assistance Equipment used: None Transfers: Sit to/from Stand;Stand Pivot Transfers Sit to Stand: Min guard         General transfer comment: min guard for safety, good power up and steadying  Ambulation/Gait Ambulation/Gait assistance: Min guard;Supervision Gait Distance (Feet): 800 Feet Assistive device: None Gait Pattern/deviations: Step-through pattern;Drifts right/left Gait velocity: decr Gait velocity interpretation: >4.37 ft/sec, indicative of normal walking speed General Gait Details: min guard progressing to supervision for safety especially due to mild instability caused by ill fitting shoes, no overt LoB pt does drift right and left in hallway          Balance Overall balance assessment: Needs  assistance Sitting-balance support: No upper extremity supported;Feet supported Sitting balance-Leahy Scale: Good     Standing balance support: Bilateral upper extremity supported;During functional activity Standing balance-Leahy Scale: Fair                              Cognition Arousal/Alertness: Awake/alert Behavior During Therapy: WFL for tasks assessed/performed Overall Cognitive Status: No family/caregiver present to determine baseline cognitive functioning                                 General Comments: Pt with questionable safety awareness, refuses gait belt, insists on walking with gripper socks in athletic slides, has difficulty keeping shoes on his feet which gives his gait instability              Pertinent Vitals/Pain Pain Assessment: No/denies pain           PT Goals (current goals can now be found in the care plan section) Acute Rehab PT Goals Patient Stated Goal: return home PT Goal Formulation: With patient Time For Goal Achievement: 05/29/19 Potential to Achieve Goals: Good Progress towards PT goals: Progressing toward goals    Frequency    Min 3X/week      PT Plan Current plan remains appropriate;Equipment recommendations need to be updated    Co-evaluation PT/OT/SLP Co-Evaluation/Treatment: Yes            AM-PAC PT "6 Clicks" Mobility   Outcome Measure  Help needed turning from your back to your side while in a flat bed without using bedrails?: None Help needed  moving from lying on your back to sitting on the side of a flat bed without using bedrails?: None Help needed moving to and from a bed to a chair (including a wheelchair)?: None Help needed standing up from a chair using your arms (e.g., wheelchair or bedside chair)?: None Help needed to walk in hospital room?: None Help needed climbing 3-5 steps with a railing? : A Little 6 Click Score: 23    End of Session Equipment Utilized During Treatment:  Other (comment)(pt refuses gait belt) Activity Tolerance: Patient tolerated treatment well Patient left: in chair;with call bell/phone within reach;with chair alarm set Nurse Communication: Mobility status PT Visit Diagnosis: Unsteadiness on feet (R26.81);Other abnormalities of gait and mobility (R26.89);Muscle weakness (generalized) (M62.81)     Time: 1115-5208 PT Time Calculation (min) (ACUTE ONLY): 15 min  Charges:  $Gait Training: 8-22 mins                     [name] B. Migdalia Dk PT, DPT Acute Rehabilitation Services Pager [phone] Office [phone]    [place] 05/19/2019, 3:58 PM

## 2019-05-19 NOTE — Transfer of Care (Signed)
Immediate Anesthesia Transfer of Care Note  Patient: [name]  Procedure(s) Performed: ESOPHAGOGASTRODUODENOSCOPY (EGD) WITH PROPOFOL (N/A ) HEMOSTASIS CLIP PLACEMENT  Patient Location: Endoscopy Unit  Anesthesia Type:MAC  Level of Consciousness: awake and patient cooperative  Airway & Oxygen Therapy: Patient Spontanous Breathing  Post-op Assessment: Report given to RN and Post -op Vital signs reviewed and stable  Post vital signs: Reviewed and stable  Last Vitals:  Vitals Value Taken Time  BP    Temp    Pulse    Resp    SpO2      Last Pain:  Vitals:   05/19/19 0830  TempSrc: Oral  PainSc: 0-No pain         Complications: No apparent anesthesia complications

## 2019-05-19 NOTE — Progress Notes (Signed)
Patient now has heparin running via ML.  Rey, RN to call phlebotomy to draw blood.  Carolee Rota, RN [facility]

## 2019-05-19 NOTE — Anesthesia Preprocedure Evaluation (Addendum)
Anesthesia Evaluation  Patient identified by MRN, date of birth, ID band Patient awake    Reviewed: Allergy & Precautions, NPO status , Patient's Chart, lab work & pertinent test results, reviewed documented beta blocker date and time   History of Anesthesia Complications Negative for: history of anesthetic complications  Airway Mallampati: II  TM Distance: >3 FB Neck ROM: Full    Dental  (+) Dental Advisory Given, Teeth Intact   Pulmonary pneumonia, resolved,   Intubated several days ago for respiratory failure, extubated 5/27    breath sounds clear to auscultation       Cardiovascular hypertension, Pt. on home beta blockers and Pt. on medications +CHF  + dysrhythmias Atrial Fibrillation + Valvular Problems/Murmurs  Rhythm:Irregular Rate:Normal   S/p bioprosthetic AVR and ascending aorta replacement   '20 TTE -  Small, fixed thrombus on the apical wall of the left ventricle. EF 20-25%. LV cavity size was moderately dilated. Moderate concentricLVH. Indeterminate diastolic filling due to E-A fusion. Left ventricular diffuse hypokinesis. RV has moderately reduced systolic function. RV cavity was mildly enlarged. Mildly increased RV wall thickness. LA and RA were severely dilated. Trivial pericardial effusion is present. A 64m an Edwards bioprosthesis valve is present in the aortic position    Neuro/Psych negative neurological ROS  negative psych ROS   GI/Hepatic negative GI ROS, Neg liver ROS,   Endo/Other  diabetes, Well Controlled Obesity   Renal/GU CRFRenal disease     Musculoskeletal negative musculoskeletal ROS (+)   Abdominal   Peds  Hematology  (+) anemia ,  Leukocytosis    Anesthesia Other Findings   Reproductive/Obstetrics                            Anesthesia Physical Anesthesia Plan  ASA: IV  Anesthesia Plan: MAC   Post-op Pain Management:    Induction:  Intravenous  PONV Risk Score and Plan: 1 and Propofol infusion and Treatment may vary due to age or medical condition  Airway Management Planned: Nasal Cannula and Natural Airway  Additional Equipment: None  Intra-op Plan:   Post-operative Plan:   Informed Consent: I have reviewed the patients History and Physical, chart, labs and discussed the procedure including the risks, benefits and alternatives for the proposed anesthesia with the patient or authorized representative who has indicated his/her understanding and acceptance.       Plan Discussed with: CRNA and Anesthesiologist  Anesthesia Plan Comments:        Anesthesia Quick Evaluation

## 2019-05-19 NOTE — Progress Notes (Signed)
PT Cancellation Note  Patient Details Name: [name] MRN: [id number] DOB: 05/18/1961   Cancelled Treatment:    Reason Eval/Treat Not Completed: (P) Patient at procedure or test/unavailable Pt at ESOPHAGOGASTRODUODENOSCOPY (EGD) this morning. PT will follow back this afternoon for treatment.   [name] B. Migdalia Dk PT, DPT Acute Rehabilitation Services Pager [phone] Office [phone]    [place] 05/19/2019, 10:07 AM

## 2019-05-19 NOTE — Progress Notes (Signed)
[name] [facility]  PROGRESS NOTE    [name]  LNL:[id number] DOB: 1961/08/13 DOA: 05/11/2019 PCP: [name], MD   Brief Narrative:   Jonathon Ose Pattersonis a 58 y.o.malewith PMH of systolic heart failure (EF 15%), Aortic stenosis s/p replacement, ascending thoracic aortic aneurysm, pulmonary HTN, obesity, and HTN presentsto the emergency department for evaluation of shortness of breath. Symptoms have been worsening over the past 7days. He denies any fevers but has had some cough. He notes worsening swelling in the legs per EMS and reports that he has been compliant with his Lasix. Denies chest pain.EMS state that initially the patient appeared to have mild symptoms. He was placed on oxygen at home and allowed to gather several things around his room which she was able to walk and do. After walking, they report significantly worsening respiratory distress requiringnonrebreather.He noted significantly elevated blood pressures.They gave aspirin in route along with 1 nitroglycerin without significant change in symptoms.   Assessment & Plan:   Active Problems:   Acute systolic CHF (congestive heart failure) (HCC)   Cardiogenic shock (HCC)   Acute respiratory failure (HCC)   Pseudomonas pneumonia (HCC)   Complaint of melena   Acute gastric ulcer with hemorrhage   Internal and external bleeding hemorrhoids   Acute hypoxic respiratory failure requiring intubation  - due to CHF exacerbation with flash pulmonary edema and Pseudomonas pneumonia - grew Pseudomonas in sputum - Successfully extubated 5/27 - Wean oxygen; currentl on 3.5 liters [place]  - cefepime started 5/28 to finish out an additional 8 days of abx tx for PSA  Acute systolic HF/Cardiogenic shock w/ BiV HF - Echo 05/11/19: EF 20-25%, mod decreased RVF - remains on heparin gtt; warfarin started (pharm dosing) - no BB d/t SA, AV nodal dysfxn - BiDil and aldactone - Cardiology  feels he will eventually need pacemaker  Afib - per cards; will d/w EP about CRT pacing per their note  LV apical thrombus - warfarin held d/t GIB     - can continue heparin at this time  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 4 hemostatic clips were used for hemostasis. Anoscopy was also performed that show external hemorroids     - per GI note: ok to resume heparin, hold on coumadin for now, PPI infusion to go for 72 hrs     - let's check BID Hgb for now   DDVT prophylaxis:Heparin Code Status:FULL Disposition Plan:TBD  Consultants:   Cardiology  GI  Procedures:   EGD  Antimicrobials:  . Cefepime    Subjective: "Ok, ok. I ready."  Objective: Vitals:   05/19/19 0558 05/19/19 0830 05/19/19 0940 05/19/19 1000  BP: 109/60 136/75 (!) 132/94 (!) 134/99  Pulse: 72     Resp:  16 18 (!) 22  Temp: 98.7 F (37.1 C) 98.7 F (37.1 C)    TempSrc: Oral Oral    SpO2: 97% 95% 95% 95%  Weight:  114.2 kg    Height:  5\' 11"  (1.803 m)      Intake/Output Summary (Last 24 hours) at 05/19/2019 1104 Last data filed at 05/19/2019 0926 Gross per 24 hour  Intake 1484.21 ml  Output 2800 ml  Net -1315.79 ml   [name]  Weights   05/18/19 0559 05/19/19 0552 05/19/19 0830  Weight: 115.5 kg 114.2 kg 114.2 kg    Examination:  General exam:58 y.o.maleAppears calm and comfortable  Respiratory system: Clear to auscultationanteriorly, some crackles at bases Cardiovascular system:S1 &S2 heard, RRR. 1/6 SEM,No JVD, rubs, gallops or clicks.BLE edema Gastrointestinal system:Abdomen is nondistended, soft and nontender. No organomegaly or masses felt. Normal bowel sounds heard. Central nervous system:Alert and oriented. No  focal neurological deficits. Extremities: Symmetric 5 x 5 power.    Data Reviewed: I have personally reviewed following labs and imaging studies.  CBC: Recent Labs  Lab 05/14/19 0400 05/15/19 0357 05/18/19 0437 05/18/19 1416 05/19/19 0355  WBC 9.5 13.3* 16.9* 15.8* 20.3*  HGB 13.7 12.5* 9.5* 10.3* 8.6*  HCT 43.2 39.5 29.1* 32.5* 26.9*  MCV 85.9 85.5 86.4 85.1 85.1  PLT 180 187 254 284 235   Basic Metabolic Panel: Recent Labs  Lab 05/13/19 0646 05/13/19 1649 05/14/19 0400 05/15/19 0357 05/16/19 0339 05/17/19 0356 05/18/19 0437 05/19/19 0355  NA 143  --  145 147* 141 139 140 138  K 4.2  --  4.2 3.6 5.2* 3.9 3.8 3.8  CL 108  --  105 106 104 102 103 104  CO2 23  --  27 32 22 27 27 26   GLUCOSE 123*  --  123* 102* 141* 156* 131* 131*  BUN 25*  --  41* 27* 26* 36* 44* 31*  CREATININE 1.90*  --  2.03* 1.53* 1.38* 1.18 1.10 1.16  CALCIUM 9.0  --  9.2 9.0 9.4 9.1 8.8* 8.9  MG 2.0 2.3 2.3 2.2  --   --  1.8 1.9  PHOS 3.3 3.9 4.6 3.6  --   --   --  2.9   GFR: Estimated Creatinine Clearance: 89.2 mL/min (by C-G formula based on SCr of 1.16 mg/dL). Liver Function Tests: Recent Labs  Lab 05/14/19 0400 05/15/19 0357 05/19/19 0355  AST 36 37  --   ALT 47* 47*  --   ALKPHOS 80 101  --   BILITOT 1.0 1.3*  --   PROT 6.5 6.4*  --   ALBUMIN 3.1* 2.9* 2.5*   No results for input(s): LIPASE, AMYLASE in the last 168 hours. No results for input(s): AMMONIA in the last 168 hours. Coagulation Profile: Recent Labs  Lab 05/16/19 0339 05/17/19 0356 05/18/19 0437 05/19/19 0355  INR 1.3* 1.3* 1.3* 1.6*   Cardiac Enzymes: No results for input(s): CKTOTAL, CKMB, CKMBINDEX, TROPONINI in the last 168 hours. BNP (last 3 results) No results for input(s): PROBNP in the last 8760 hours. HbA1C: No results for input(s): HGBA1C in the last 72 hours. CBG: Recent Labs  Lab 05/18/19 0608 05/18/19 1218 05/18/19 1659 05/18/19 2129 05/19/19 0619  GLUCAP 113* 140* 211* 164* 121*    Lipid Profile: No results for input(s): CHOL, HDL, LDLCALC, TRIG, CHOLHDL, LDLDIRECT in the last 72 hours. Thyroid Function Tests: No results for input(s): TSH, T4TOTAL, FREET4, T3FREE, THYROIDAB in the last 72 hours. Anemia Panel: No results for input(s): VITAMINB12, FOLATE, FERRITIN, TIBC, IRON, RETICCTPCT in the last 72 hours. Sepsis Labs: No results for input(s): PROCALCITON, LATICACIDVEN in the last 168 hours.  Recent Results (from the past 240 hour(s))  SARS Coronavirus 2 (CEPHEID- Performed in [place] hospital lab), Hosp Order     Status: None   Collection Time: 05/11/19  8:44 AM  Result Value Ref Range Status   SARS Coronavirus 2 NEGATIVE NEGATIVE Final    Comment: (NOTE) If result is  NEGATIVE SARS-CoV-2 target nucleic acids are NOT DETECTED. The SARS-CoV-2 RNA is generally detectable in upper and lower  respiratory specimens during the acute phase of infection. The lowest  concentration of SARS-CoV-2 viral copies this assay can detect is 250  copies / mL. A negative result does not preclude SARS-CoV-2 infection  and should not be used as the sole basis for treatment or other  patient management decisions.  A negative result may occur with  improper specimen collection / handling, submission of specimen other  than nasopharyngeal swab, presence of viral mutation(s) within the  areas targeted by this assay, and inadequate number of viral copies  (<250 copies / mL). A negative result must be combined with clinical  observations, patient history, and epidemiological information. If result is POSITIVE SARS-CoV-2 target nucleic acids are DETECTED. The SARS-CoV-2 RNA is generally detectable in upper and lower  respiratory specimens dur ing the acute phase of infection.  Positive  results are indicative of active infection with SARS-CoV-2.  Clinical  correlation with patient history and other diagnostic information is  necessary to determine patient infection status.   Positive results do  not rule out bacterial infection or co-infection with other viruses. If result is PRESUMPTIVE POSTIVE SARS-CoV-2 nucleic acids MAY BE PRESENT.   A presumptive positive result was obtained on the submitted specimen  and confirmed on repeat testing.  While 2019 novel coronavirus  (SARS-CoV-2) nucleic acids may be present in the submitted sample  additional confirmatory testing may be necessary for epidemiological  and / or clinical management purposes  to differentiate between  SARS-CoV-2 and other Sarbecovirus currently known to infect humans.  If clinically indicated additional testing with an alternate test  methodology [phone]) is advised. The SARS-CoV-2 RNA is generally  detectable in upper and lower respiratory sp ecimens during the acute  phase of infection. The expected result is Negative. Fact Sheet for Patients:  StrictlyIdeas.no Fact Sheet for Healthcare Providers: BankingDealers.co.za This test is not yet approved or cleared by the [place] FDA and has been authorized for detection and/or diagnosis of SARS-CoV-2 by FDA under an Emergency Use Authorization (EUA).  This EUA will remain in effect (meaning this test can be used) for the duration of the COVID-19 declaration under Section 564(b)(1) of the Act, 21 U.S.C. section 360bbb-3(b)(1), unless the authorization is terminated or revoked sooner. Performed at [facility], [place] [address] [place], [place] [address]   Respiratory Panel by PCR     Status: None   Collection Time: 05/11/19 10:15 AM  Result Value Ref Range Status   Adenovirus NOT DETECTED NOT DETECTED Final   Coronavirus 229E NOT DETECTED NOT DETECTED Final    Comment: (NOTE) The Coronavirus on the Respiratory Panel, DOES NOT test for the novel  Coronavirus (2019 nCoV)    Coronavirus HKU1 NOT DETECTED NOT DETECTED Final   Coronavirus NL63 NOT DETECTED NOT DETECTED Final    Coronavirus OC43 NOT DETECTED NOT DETECTED Final   Metapneumovirus NOT DETECTED NOT DETECTED Final   Rhinovirus / Enterovirus NOT DETECTED NOT DETECTED Final   Influenza A NOT DETECTED NOT DETECTED Final   Influenza B NOT DETECTED NOT DETECTED Final   Parainfluenza Virus 1 NOT DETECTED NOT DETECTED Final   Parainfluenza Virus 2 NOT DETECTED NOT DETECTED Final   Parainfluenza Virus 3 NOT DETECTED NOT DETECTED Final   Parainfluenza Virus 4 NOT DETECTED NOT DETECTED Final   Respiratory Syncytial Virus NOT DETECTED NOT DETECTED Final   Bordetella pertussis NOT DETECTED  NOT DETECTED Final   Chlamydophila pneumoniae NOT DETECTED NOT DETECTED Final   Mycoplasma pneumoniae NOT DETECTED NOT DETECTED Final    Comment: Performed at [facility], [place] [address], [place], [place] [address]  MRSA PCR Screening     Status: Abnormal   Collection Time: 05/11/19 12:03 PM  Result Value Ref Range Status   MRSA by PCR POSITIVE (A) NEGATIVE Final    Comment:        The GeneXpert MRSA Assay (FDA approved for NASAL specimens only), is one component of a comprehensive MRSA colonization surveillance program. It is not intended to diagnose MRSA infection nor to guide or monitor treatment for MRSA infections. RESULT CALLED TO, READ BACK BY AND VERIFIED WITH: RN M CROSS [id number] AT [id number] BY CM Performed at [facility], [place] [address], [place], [place] [address]   Culture, blood (Routine X 2) w Reflex to ID Panel     Status: None   Collection Time: 05/11/19 12:17 PM  Result Value Ref Range Status   Specimen Description BLOOD BLOOD RIGHT HAND  Final   Special Requests AEROBIC BOTTLE ONLY Blood Culture adequate volume  Final   Culture   Final    NO GROWTH 5 DAYS Performed at [facility], [place] [address], [place], [place] [address]    Report Status 05/16/2019 FINAL  Final  Culture, blood (Routine X 2) w Reflex to ID Panel     Status: None   Collection Time: 05/11/19 12:17 PM  Result Value  Ref Range Status   Specimen Description BLOOD RIGHT ANTECUBITAL  Final   Special Requests   Final    AEROBIC BOTTLE ONLY Blood Culture results may not be optimal due to an inadequate volume of blood received in culture bottles   Culture   Final    NO GROWTH 5 DAYS Performed at [facility], [place] [address], [place], [place] [address]    Report Status 05/16/2019 FINAL  Final  Culture, respiratory (non-expectorated)     Status: None   Collection Time: 05/11/19 11:57 PM  Result Value Ref Range Status   Specimen Description TRACHEAL ASPIRATE  Final   Special Requests NONE  Final   Gram Stain   Final    ABUNDANT WBC PRESENT, PREDOMINANTLY PMN RARE GRAM NEGATIVE RODS RARE GRAM POSITIVE COCCI Performed at [facility], [place] [address], [place], [place] [address]    Culture RARE PSEUDOMONAS AERUGINOSA  Final   Report Status 05/15/2019 FINAL  Final   Organism ID, Bacteria PSEUDOMONAS AERUGINOSA  Final      Susceptibility   Pseudomonas aeruginosa - MIC*    CEFTAZIDIME 8 SENSITIVE Sensitive     CIPROFLOXACIN <=0.25 SENSITIVE Sensitive     GENTAMICIN <=1 SENSITIVE Sensitive     IMIPENEM 2 SENSITIVE Sensitive     PIP/TAZO 64 SENSITIVE Sensitive     CEFEPIME 4 SENSITIVE Sensitive     * RARE PSEUDOMONAS AERUGINOSA         Radiology Studies: No results found.      Scheduled Meds: . allopurinol  200 mg Oral Daily  . feeding supplement (ENSURE ENLIVE)  237 mL Oral BID BM  . furosemide  40 mg Oral BID  . guaiFENesin  600 mg Oral BID  . hydrocortisone   Rectal BID  . insulin aspart  0-20 Units Subcutaneous TID AC & HS  . isosorbide-hydrALAZINE  1 tablet Oral TID  . [START ON 05/22/2019] pantoprazole  40  mg Intravenous Q12H  . predniSONE  40 mg Oral Q breakfast  . sacubitril-valsartan  1 tablet Oral BID  . senna  1 tablet Oral BID  . spironolactone  12.5 mg Oral Daily  . Warfarin - Pharmacist Dosing Inpatient   Does not apply q1800   Continuous Infusions: . sodium  chloride    . ceFEPime (MAXIPIME) IV 2 g (05/19/19 0548)  . pantoprazole (PROTONIX) IVPB    . pantoprozole (PROTONIX) infusion       LOS: 8 days    Time spent: 25 minutes spent in the coordination of care today.    Jonnie Finner, DO [facility] Pager [phone]  If 7PM-7AM, please contact night-coverage [email or web address] Password [email or web address] 05/19/2019, 11:04 AM

## 2019-05-19 NOTE — Anesthesia Postprocedure Evaluation (Signed)
Anesthesia Post Note  Patient: [name]  Procedure(s) Performed: ESOPHAGOGASTRODUODENOSCOPY (EGD) WITH PROPOFOL (N/A ) HEMOSTASIS CLIP PLACEMENT     Patient location during evaluation: PACU Anesthesia Type: MAC Level of consciousness: awake and alert Pain management: pain level controlled Vital Signs Assessment: post-procedure vital signs reviewed and stable Respiratory status: spontaneous breathing, nonlabored ventilation and respiratory function stable Cardiovascular status: stable and blood pressure returned to baseline Anesthetic complications: no    Last Vitals:  Vitals:   05/19/19 1000 05/19/19 1156  BP: (!) 134/99 100/84  Pulse:  66  Resp: (!) 22 18  Temp:  36.9 C  SpO2: 95% 95%    Last Pain:  Vitals:   05/19/19 1156  TempSrc: Oral  PainSc:                  Audry Pili

## 2019-05-19 NOTE — Procedures (Signed)
Anoscopy performed after EGD - patient had residual sedation  Gr 2 internal and external hemorrhoids, inflamed with stigmata of bleeding  Likely cause of rectal bleeding  Treat with 2.5% Hydrocortisone cream

## 2019-05-19 NOTE — Progress Notes (Signed)
Advanced Heart Failure Rounding Note   Subjective:    Underwent EGD and anoscopy with nonbleeding gastric ulcers (clipped) and hemorrhoids. Now back on heparin.  Breathing feels good. No orthopnea or PND. Weight stable at 251  Objective:   Weight Range:  Vital Signs:   Temp:  [98.4 F (36.9 C)-99.2 F (37.3 C)] 98.4 F (36.9 C) (06/01 1156) Pulse Rate:  [65-79] 66 (06/01 1156) Resp:  [16-22] 18 (06/01 1156) BP: (100-136)/(55-99) 100/84 (06/01 1156) SpO2:  [95 %-98 %] 95 % (06/01 1156) Weight:  [114.2 kg] 114.2 kg (06/01 0830) Last BM Date: 05/18/19  Weight change: Filed Weights   05/18/19 0559 05/19/19 0552 05/19/19 0830  Weight: 115.5 kg 114.2 kg 114.2 kg    Intake/Output:   Intake/Output Summary (Last 24 hours) at 05/19/2019 1431 Last data filed at 05/19/2019 1300 Gross per 24 hour  Intake 1364.21 ml  Output 3100 ml  Net -1735.79 ml     Physical Exam: General:  Well appearing. No resp difficulty HEENT: normal Neck: supple. no JVD. Carotids 2+ bilat; no bruits. No lymphadenopathy or thryomegaly appreciated. Cor: PMI nondisplaced. Irregular rate & rhythm. No rubs, gallops or murmurs. Lungs: clear Abdomen: soft, nontender, nondistended. No hepatosplenomegaly. No bruits or masses. Good bowel sounds. Extremities: no cyanosis, clubbing, rash, edema Neuro: alert & orientedx3, cranial nerves grossly intact. moves all 4 extremities w/o difficulty. Affect pleasant    Telemetry: AF 60-70 + PVCs Personally reviewed    Labs: Basic Metabolic Panel: Recent Labs  Lab 05/13/19 0646 05/13/19 1649 05/14/19 0400 05/15/19 0357 05/16/19 0339 05/17/19 0356 05/18/19 0437 05/19/19 0355  NA 143  --  145 147* 141 139 140 138  K 4.2  --  4.2 3.6 5.2* 3.9 3.8 3.8  CL 108  --  105 106 104 102 103 104  CO2 23  --  27 32 22 27 27 26   GLUCOSE 123*  --  123* 102* 141* 156* 131* 131*  BUN 25*  --  41* 27* 26* 36* 44* 31*  CREATININE 1.90*  --  2.03* 1.53* 1.38* 1.18 1.10  1.16  CALCIUM 9.0  --  9.2 9.0 9.4 9.1 8.8* 8.9  MG 2.0 2.3 2.3 2.2  --   --  1.8 1.9  PHOS 3.3 3.9 4.6 3.6  --   --   --  2.9    Liver Function Tests: Recent Labs  Lab 05/14/19 0400 05/15/19 0357 05/19/19 0355  AST 36 37  --   ALT 47* 47*  --   ALKPHOS 80 101  --   BILITOT 1.0 1.3*  --   PROT 6.5 6.4*  --   ALBUMIN 3.1* 2.9* 2.5*   No results for input(s): LIPASE, AMYLASE in the last 168 hours. No results for input(s): AMMONIA in the last 168 hours.  CBC: Recent Labs  Lab 05/14/19 0400 05/15/19 0357 05/18/19 0437 05/18/19 1416 05/19/19 0355  WBC 9.5 13.3* 16.9* 15.8* 20.3*  HGB 13.7 12.5* 9.5* 10.3* 8.6*  HCT 43.2 39.5 29.1* 32.5* 26.9*  MCV 85.9 85.5 86.4 85.1 85.1  PLT 180 187 254 284 282    Cardiac Enzymes: No results for input(s): CKTOTAL, CKMB, CKMBINDEX, TROPONINI in the last 168 hours.  BNP: BNP (last 3 results) Recent Labs    11/29/18 0808 05/11/19 0844  BNP 1,232.9* 1,980.2*    ProBNP (last 3 results) No results for input(s): PROBNP in the last 8760 hours.    Other results:  Imaging: No results found.  Medications:     Scheduled Medications:  allopurinol  200 mg Oral Daily   feeding supplement (ENSURE ENLIVE)  237 mL Oral BID BM   furosemide  40 mg Oral BID   guaiFENesin  600 mg Oral BID   hydrocortisone   Rectal BID   insulin aspart  0-20 Units Subcutaneous TID AC & HS   isosorbide-hydrALAZINE  1 tablet Oral TID   [START ON 05/22/2019] pantoprazole  40 mg Intravenous Q12H   predniSONE  40 mg Oral Q breakfast   sacubitril-valsartan  1 tablet Oral BID   senna  1 tablet Oral BID   spironolactone  12.5 mg Oral Daily    Infusions:  sodium chloride     ceFEPime (MAXIPIME) IV 2 g (05/19/19 0548)   heparin 2,100 Units/hr (05/19/19 1234)   pantoprazole (PROTONIX) IVPB     pantoprozole (PROTONIX) infusion 8 mg/hr (05/19/19 1234)    PRN Medications: acetaminophen, albuterol, benzonatate, sodium chloride  flush   Assessment/plan:   1. Acute systolic HF/cardiogenic shock with biventricular HF - Echo 05/11/19 EF 20-25% with moderately decreased RV function  - Echo 12/19 EF 50% - Due to NICM (coronaries normal in 2016). Suspect due to probable tachy-induced CM (Was in AFL in 140s on admit) - Volume status continues to look good on lasix 80 bid (was on 40 po bid at home). Will continue  - Entresto 24/26 bid started 5/30. Tolerating well  - Continue Bidil and spiro. - No b-blocker with sinus and AV node dysfunction  2. PAFL/ AFIB - suspect this may be cause of CM  - however previous ECGs also suggest possible AV dissociation with accelerated junctional outpacing his sinus - Now in AF with rate control - I have asked EP to see to question whether or not he may benefit from CRTpacing to help improve his CM and allow [place] to use AA as needed (versus AVN ablation). Will follow as outpatient - AC stopped due to GI bleeding results of endoscopy noted. Now back on heparin. If stable can restart warfarin. Discussed dosing with PharmD personally.  3. Acute hypoxic respiratory failure - likely due to pulmonary edema. Improved with diuresis. Also treated for PNA - extubated 5/27  4. AK on CKD 3 - baseline creatinine 1.8. creatinine 1.1 today  5. 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 LV apical thrombus - see AC discussion above.    8. Morbid obesity - will need weight loss   9. Hypomag - supped  10. R wrist pain - likely acute gout - on prednisone 40 daily day 3/3.  - uric acid elevated - allopurinol 200 daily started.   11. GIB - per GI and primary team. Results as above    Length of Stay: 8   Glori Bickers MD 05/19/2019, 2:31 PM  Advanced Heart Failure Team Pager [phone] (M-F; [place])  Please contact [facility] for night-coverage after hours (4p -7a ) and weekends on [email or web address]

## 2019-05-19 NOTE — Op Note (Addendum)
[facility] Patient Name: [name] Procedure Date : 05/19/2019 MRN: [id number] Attending MD: [name] , MD Date of Birth: Apr 06, 1961 CSN: [id number] Age: 58 Admit Type: Inpatient Procedure:                Upper GI endoscopy Indications:              Melena Providers:                [name], MD, Jeanella Cara, RN,                            Marguerita Merles, Technician Referring MD:              Medicines:                Propofol per Anesthesia, Monitored Anesthesia Care Complications:            No immediate complications. Estimated Blood Loss:     Estimated blood loss: none. Procedure:                Pre-Anesthesia Assessment:                           - Prior to the procedure, a History and Physical                            was performed, and patient medications and                            allergies were reviewed. The patient's tolerance of                            previous anesthesia was also reviewed. The risks                            and benefits of the procedure and the sedation                            options and risks were discussed with the patient.                            All questions were answered, and informed consent                            was obtained. Prior Anticoagulants: The patient                            last took Coumadin (warfarin) 2 days prior to the                            procedure and last took heparin on the day of the                            procedure. ASA Grade Assessment: IV - A patient  with severe systemic disease that is a constant                            threat to life. After reviewing the risks and                            benefits, the patient was deemed in satisfactory                            condition to undergo the procedure.                           After obtaining informed consent, the endoscope was                            passed under  direct vision. Throughout the                            procedure, the patient's blood pressure, pulse, and                            oxygen saturations were monitored continuously. The                            GIF-H190 ([id number]) Olympus gastroscope was                            introduced through the mouth, and advanced to the                            second part of duodenum. The upper GI endoscopy was                            accomplished without difficulty. The patient                            tolerated the procedure well. Scope In: Scope Out: Findings:      Four non-bleeding cratered and linear gastric ulcers with adherent clot       were found in the prepyloric region of the stomach. The largest lesion       was 5 mm in largest dimension. For hemostasis, four hemostatic clips       were successfully placed (MR conditional). There was no bleeding during,       or at the end, of the procedure. Estimated blood loss: none.      Patchy moderately erythematous mucosa without bleeding was found in the       prepyloric region of the stomach.      The exam was otherwise without abnormality.      The cardia and gastric fundus were normal on retroflexion. Impression:               - Non-bleeding gastric ulcers with adherent clot.                            Clips (MR conditional)  were placed. 1 clip each                            ulcer - 2 cliups misfired - 6 used total.                           - Erythematous mucosa in the prepyloric region of                            the stomach.                           - The examination was otherwise normal.                           - No specimens collected.                           ANOSCOPY ALSO DONE - INFLAMED GRADE 2 INTERNAL AND                            EXTERNAL HEMORRHOIDS THAT ARE CAUSING RECTAL                            BLEEDING Recommendation:           - Return patient to hospital ward for ongoing care.                            - Cardiac diet.                           - Continue present medications.                           - STARTED PPI INFUSION WHICH I WOULD CONTINUE X 48                            -72 HOURS                           OK TO RESUME HEPARIN BUT NEED TO SEE HOW HE WILL DO                            BEFORE RESTARTING WARFARIN                           PRESUME THESE ARE STRESS-RELATED ULCERS, ? SOME                            CONTRIBUTION FROM STEROIDS BUT SUSPECT PRIMARY                            INSULT WAS STRESS FROM OTHER ILLNESS, HYPOTENSION,  ETC                           TREAT HEMORRHOIDS WITH HC CREAM Procedure Code(s):        --- Professional ---                           [phone], Esophagogastroduodenoscopy, flexible,                            transoral; with control of bleeding, any method Diagnosis Code(s):        --- Professional ---                           K25.4, Chronic or unspecified gastric ulcer with                            hemorrhage                           K31.89, Other diseases of stomach and duodenum                           K92.1, Melena (includes Hematochezia) CPT copyright 2019 American Medical Association. All rights reserved. The codes documented in this report are preliminary and upon coder review may  be revised to meet current compliance requirements. [name], MD 05/19/2019 9:39:05 AM This report has been signed electronically. Number of Addenda: 0

## 2019-05-19 NOTE — Progress Notes (Signed)
[place] for Heparin/warfarin Indication: LV apical thrombus + Afib   Patient Measurements: Height: 5\' 11"  (180.3 cm) Weight: 251 lb 12.8 oz (114.2 kg) IBW/kg (Calculated) : 75.3 Heparin Dosing Weight: 100kg  Vital Signs: Temp: 98.4 F (36.9 C) (06/01 1156) Temp Source: Oral (06/01 1156) BP: 100/84 (06/01 1156) Pulse Rate: 66 (06/01 1156)  Labs: Recent Labs    05/17/19 0356  05/18/19 0437 05/18/19 1416 05/19/19 0355 05/19/19 0356  HGB  --    < > 9.5* 10.3* 8.6*  --   HCT  --   --  29.1* 32.5* 26.9*  --   PLT  --   --  254 284 282  --   LABPROT 15.7*  --  16.0*  --  18.5*  --   INR 1.3*  --  1.3*  --  1.6*  --   HEPARINUNFRC 0.50  --  0.58  --   --  0.73*  CREATININE 1.18  --  1.10  --  1.16  --    < > = values in this interval not displayed.     Medical History: Past Medical History:  Diagnosis Date  . Aortic valve disease    a. 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 Atrial flutter ([place])   . Benign hypertensive heart and renal disease   . Chronic combined systolic and diastolic CHF (congestive heart failure) ([place])   . Diabetes mellitus ([place])   . Essential hypertension 11/19/2014  . Insomnia   . Murmur   . Noncompliance   . Obesity   . Pulmonary hypertension (HCC)    Assessment: 68 yoM admitted with SOB requiring intubation. Labs reveal slightly elevated troponins and D-dimer >20. Baseline INR and aPTT normal, CBC wnl, no anticoagulation PTA. 5/24 ECHO showing small, fixed thrombus on apical wall of LV. Pharmacy consulted to dose Heparin for anticoagulation.   Heparin drip 2100 uts/hr HL 0.7 at goal - but held for EGD this am - and restarted at noon - no bolus -  S/p drop in h/h > EGD with gastric ulcers s/p clip - ok to restart heparin but hold on warfarin for now to ensure tolerates AC INR 1.6 < goal - would be good idea to have INR > 2 prior to dc  heparin drip with + apical thrombus.   Follow CBC   Goal of Therapy:  Heparin level 0.3-0.7 units/ml  INR 2-3 Monitor platelets by anticoagulation protocol: Yes   Plan:  - Restart  Heparin drip at 2100 units/hr  - holding warfarin for now  - Daily HL, PT/INR - Will continue to monitor for any bleeding complications   Bonnita Nasuti Pharm.D. CPP, BCPS Clinical Pharmacist [phone] 05/19/2019 1:51 PM

## 2019-05-20 ENCOUNTER — Encounter (HOSPITAL_COMMUNITY): Payer: Self-pay | Admitting: Internal Medicine

## 2019-05-20 DIAGNOSIS — K250 Acute gastric ulcer with hemorrhage: Secondary | ICD-10-CM

## 2019-05-20 LAB — RENAL FUNCTION PANEL
Albumin: 2.5 g/dL — ABNORMAL LOW (ref 3.5–5.0)
Anion gap: 8 (ref 5–15)
BUN: 24 mg/dL — ABNORMAL HIGH (ref 6–20)
CO2: 27 mmol/L (ref 22–32)
Calcium: 9 mg/dL (ref 8.9–10.3)
Chloride: 103 mmol/L (ref 98–111)
Creatinine, Ser: 1.29 mg/dL — ABNORMAL HIGH (ref 0.61–1.24)
GFR calc Af Amer: >60 mL/min (ref >60)
GFR calc non Af Amer: >60 mL/min (ref >60)
Glucose, Bld: 140 mg/dL — ABNORMAL HIGH (ref 70–99)
Phosphorus: 3.5 mg/dL (ref 2.5–4.6)
Potassium: 3.9 mmol/L (ref 3.5–5.1)
Sodium: 138 mmol/L (ref 135–145)

## 2019-05-20 LAB — CBC WITH DIFFERENTIAL/PLATELET
Abs Immature Granulocytes: 0.48 10*3/uL — ABNORMAL HIGH (ref 0.00–0.07)
Basophils Absolute: 0 10*3/uL (ref 0.0–0.1)
Basophils Relative: 0 %
Eosinophils Absolute: 0.1 10*3/uL (ref 0.0–0.5)
Eosinophils Relative: 0 %
HCT: 26 % — ABNORMAL LOW (ref 39.0–52.0)
Hemoglobin: 8.4 g/dL — ABNORMAL LOW (ref 13.0–17.0)
Immature Granulocytes: 2 %
Lymphocytes Relative: 12 %
Lymphs Abs: 2.7 10*3/uL (ref 0.7–4.0)
MCH: 28 pg (ref 26.0–34.0)
MCHC: 32.3 g/dL (ref 30.0–36.0)
MCV: 86.7 fL (ref 80.0–100.0)
Monocytes Absolute: 1.7 10*3/uL — ABNORMAL HIGH (ref 0.1–1.0)
Monocytes Relative: 8 %
Neutro Abs: 17.2 10*3/uL — ABNORMAL HIGH (ref 1.7–7.7)
Neutrophils Relative %: 78 %
Platelets: 309 10*3/uL (ref 150–400)
RBC: 3 MIL/uL — ABNORMAL LOW (ref 4.22–5.81)
RDW: 14.9 % (ref 11.5–15.5)
WBC: 22.2 10*3/uL — ABNORMAL HIGH (ref 4.0–10.5)
nRBC: 0.6 % — ABNORMAL HIGH (ref 0.0–0.2)

## 2019-05-20 LAB — GLUCOSE, CAPILLARY
Glucose-Capillary: 263 mg/dL — ABNORMAL HIGH (ref 70–99)
Glucose-Capillary: 165 mg/dL — ABNORMAL HIGH (ref 70–99)
Glucose-Capillary: 110 mg/dL — ABNORMAL HIGH (ref 70–99)
Glucose-Capillary: 223 mg/dL — ABNORMAL HIGH (ref 70–99)
Glucose-Capillary: 198 mg/dL — ABNORMAL HIGH (ref 70–99)

## 2019-05-20 LAB — HEPARIN LEVEL (UNFRACTIONATED): Heparin Unfractionated: 0.66 IU/mL (ref 0.30–0.70)

## 2019-05-20 LAB — PROTIME-INR
INR: 1.5 — ABNORMAL HIGH (ref 0.8–1.2)
Prothrombin Time: 18.3 seconds — ABNORMAL HIGH (ref 11.4–15.2)

## 2019-05-20 LAB — MAGNESIUM: Magnesium: 1.8 mg/dL (ref 1.7–2.4)

## 2019-05-20 MED ORDER — PANTOPRAZOLE SODIUM 40 MG IV SOLR
40.0000 mg | Freq: Two times a day (BID) | INTRAVENOUS | Status: AC
  Administered 2019-05-21 – 2019-05-22 (×3): 40 mg via INTRAVENOUS
  Filled 2019-05-20 (×3): qty 40

## 2019-05-20 MED ORDER — HYDROCORTISONE ACETATE 25 MG RE SUPP: 25.0000 mg | Freq: Two times a day (BID) | RECTAL | Status: DC | PRN

## 2019-05-20 NOTE — Progress Notes (Signed)
Pt had a 8 beat run of Tanna Furry, MD paged

## 2019-05-20 NOTE — Care Management Important Message (Signed)
Important Message  Patient Details  Name: [name] MRN: [id number] Date of Birth: 16-Dec-1961   Medicare Important Message Given:  Yes    Orbie Pyo 05/20/2019, 12:24 PM

## 2019-05-20 NOTE — Progress Notes (Addendum)
[place] for Heparin/warfarin Indication: LV apical thrombus + Afib   Patient Measurements: Height: 5\' 11"  (180.3 cm) Weight: 255 lb 8 oz (115.9 kg) IBW/kg (Calculated) : 75.3 Heparin Dosing Weight: 100kg  Vital Signs: Temp: 98.4 F (36.9 C) (06/02 0622) Temp Source: Oral (06/02 0622) BP: 101/56 (06/02 0622) Pulse Rate: 60 (06/02 0622)  Labs: Recent Labs    05/18/19 0437 05/18/19 1416 05/19/19 0355 05/19/19 0356 05/19/19 1903 05/20/19 0426  HGB 9.5* 10.3* 8.6*  --  9.5* 8.4*  HCT 29.1* 32.5* 26.9*  --  29.3* 26.0*  PLT 254 284 282  --   --  309  LABPROT 16.0*  --  18.5*  --   --  18.3*  INR 1.3*  --  1.6*  --   --  1.5*  HEPARINUNFRC 0.58  --   --  0.73*  --  0.66  CREATININE 1.10  --  1.16  --   --  1.29*     Medical History: Past Medical History:  Diagnosis Date  . Aortic valve disease    a. 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 Atrial flutter ([place])   . Benign hypertensive heart and renal disease   . Chronic combined systolic and diastolic CHF (congestive heart failure) ([place])   . Diabetes mellitus ([place])   . Essential hypertension 11/19/2014  . Insomnia   . Murmur   . Noncompliance   . Obesity   . Pulmonary hypertension (HCC)    Assessment: 59 yoM admitted with SOB requiring intubation. Labs reveal slightly elevated troponins and D-dimer >20. Baseline INR and aPTT normal, CBC wnl, no anticoagulation PTA. 5/24 ECHO showing small, fixed thrombus on apical wall of LV. Pharmacy consulted to dose Heparin for anticoagulation.   Heparin drip 2100 uts/hr HL 0.66 at goal - restarted after EGD yesterday - found to have gastric ulcers - will try to keep heparin toward lower end of goal.    Hold on warfarin for now to ensure tolerates AC INR 1.5< goal - would be good idea to have INR > 2 prior to dc heparin drip with + apical thrombus.   Follow CBC   Goal of  Therapy:  Heparin level 0.3-0.7 units/ml  INR 2-3 Monitor platelets by anticoagulation protocol: Yes   Plan:  Decrease  Heparin drip 2000 units/hr  - holding warfarin for now  - Daily HL, PT/INR - Will continue to monitor for any bleeding complications -follow up with GI and TRH for when ok to restart warfarin   Bonnita Nasuti Pharm.D. CPP, BCPS Clinical Pharmacist [phone] 05/20/2019 7:37 AM

## 2019-05-20 NOTE — Progress Notes (Signed)
Daily Rounding Note  05/20/2019, 10:24 AM  LOS: 9 days   SUBJECTIVE:   Chief complaint:  Gastric ulcers, hemorrhoids. UGIB and bleeding hemorrhoids   Feels well.  Walking a lot in hall.  No dyspnea. Stool this AM less dark than previously.  No rectal bleeding  OBJECTIVE:         Vital signs in last 24 hours:    Temp:  [98.2 F (36.8 C)-98.5 F (36.9 C)] 98.4 F (36.9 C) (06/02 0622) Pulse Rate:  [60-84] 60 (06/02 0622) Resp:  [18-20] 20 (06/02 0622) BP: (100-111)/(52-84) 101/56 (06/02 0622) SpO2:  [95 %-99 %] 96 % (06/02 0622) Weight:  [115.9 kg] 115.9 kg (06/02 0600) Last BM Date: 05/18/19 Filed Weights   05/19/19 0552 05/19/19 0830 05/20/19 0600  Weight: 114.2 kg 114.2 kg 115.9 kg   General: pleasant, animated, obese.  Not ill looking   Heart: RRR.  SR ~ 100/bpm on tele Chest: very diminished sounds but no adventitious sounds.  No dyspnea.  Occasional limited cough Abdomen: obese, active BS, soft, NT.    Extremities: no CCE Neuro/Psych:  Oriented x 3.  Animated with rapid speech.  No gross deficits or weakness.    Intake/Output from previous day: 06/01 0701 - 06/02 0700 In: 1087.2 [P.O.:120; I.V.:867.2; IV Piggyback:100] Out: 2000 [Urine:2000]  Intake/Output this shift: Total I/O In: 240 [P.O.:240] Out: 300 [Urine:300]  Lab Results: Recent Labs    05/18/19 1416 05/19/19 0355 05/19/19 1903 05/20/19 0426  WBC 15.8* 20.3*  --  22.2*  HGB 10.3* 8.6* 9.5* 8.4*  HCT 32.5* 26.9* 29.3* 26.0*  PLT 284 282  --  309   BMET Recent Labs    05/18/19 0437 05/19/19 0355 05/20/19 0426  NA 140 138 138  K 3.8 3.8 3.9  CL 103 104 103  CO2 27 26 27   GLUCOSE 131* 131* 140*  BUN 44* 31* 24*  CREATININE 1.10 1.16 1.29*  CALCIUM 8.8* 8.9 9.0   LFT Recent Labs    05/19/19 0355 05/20/19 0426  ALBUMIN 2.5* 2.5*   PT/INR Recent Labs    05/19/19 0355 05/20/19 0426  LABPROT 18.5* 18.3*  INR 1.6* 1.5*   Scheduled Meds: . allopurinol  200 mg Oral Daily  . feeding supplement (ENSURE ENLIVE)  237 mL Oral BID BM  . furosemide  40 mg Oral BID  . guaiFENesin  600 mg Oral BID  . hydrocortisone   Rectal BID  . insulin aspart  0-20 Units Subcutaneous TID AC & HS  . isosorbide-hydrALAZINE  1 tablet Oral TID  . [START ON 05/22/2019] pantoprazole  40 mg Intravenous Q12H  . predniSONE  40 mg Oral Q breakfast  . sacubitril-valsartan  1 tablet Oral BID  . senna  1 tablet Oral BID  . spironolactone  12.5 mg Oral Daily   Continuous Infusions: . sodium chloride 20 mL/hr at 05/19/19 1432  . ceFEPime (MAXIPIME) IV 2 g (05/20/19 0610)  . heparin 2,000 Units/hr (05/20/19 0807)  . pantoprozole (PROTONIX) infusion 8 mg/hr (05/20/19 0810)   PRN Meds:.acetaminophen, albuterol, benzonatate, sodium chloride flush   ASSESMENT:   *   GIB, melena in setting of IV Heparin.   05/19/19 EGD: clipped non-bleeding GUs with clot, gastritis.   05/19/19 Anoscopy:  Inflamed, grade 2 int/ext rrhoids (cause of rectal bleeding)  PPI infusion x 48 hours, finished 6/3 at 1030 AM.   *   Blood loss anemia.  Hgb 17 >> 8.6 >> 9.5 >>  8.4.  No PRBCs to date.    *   Left apical thrombus: Coumadin >> IV Heparin. Bioprosthetic AVR for which AC not indicated.  L apical thrombus on Echo. A fib, flutter.    Currently on Heparin gtt   *   CHF flare with cardiogenic shock, resp failure in setting pseudomonas PNA.  EF 15%.  NICM   *   CKD stage 3.    *   Prednisone in place for acute gout.     PLAN   *   Continue the 48 hours of PPI drip.    *   Awaiting H Pylori IgG.    *   PRN Anusol for rectal bleeding.      Azucena Freed  05/20/2019, 10:24 AM Phone [phone]

## 2019-05-20 NOTE — Plan of Care (Signed)
  Problem: Education: Goal: Knowledge of General Education information will improve Description Including pain rating scale, medication(s)/side effects and non-pharmacologic comfort measures Outcome: Progressing   Problem: Health Behavior/Discharge Planning: Goal: Ability to manage health-related needs will improve Outcome: Progressing   Problem: Clinical Measurements: Goal: Ability to maintain clinical measurements within normal limits will improve Outcome: Progressing Goal: Will remain free from infection Outcome: Progressing Goal: Diagnostic test results will improve Outcome: Progressing Goal: Respiratory complications will improve Outcome: Progressing Goal: Cardiovascular complication will be avoided Outcome: Progressing   Problem: Activity: Goal: Risk for activity intolerance will decrease Outcome: Progressing   Problem: Coping: Goal: Level of anxiety will decrease Outcome: Progressing   Problem: Elimination: Goal: Will not experience complications related to bowel motility Outcome: Progressing Goal: Will not experience complications related to urinary retention Outcome: Progressing   Problem: Pain Managment: Goal: General experience of comfort will improve Outcome: Progressing   Problem: Education: Goal: Ability to demonstrate management of disease process will improve Outcome: Progressing Goal: Ability to verbalize understanding of medication therapies will improve Outcome: Progressing Goal: Individualized Educational Video(s) Outcome: Progressing   Problem: Activity: Goal: Capacity to carry out activities will improve Outcome: Progressing   Problem: Cardiac: Goal: Ability to achieve and maintain adequate cardiopulmonary perfusion will improve Outcome: Progressing

## 2019-05-20 NOTE — TOC Transition Note (Signed)
Transition of Care [facility]) - CM/SW Discharge Note   Patient Details  Name: [name] MRN: [id number] Date of Birth: [birth year]  [facility] CM/SW Contact:  Sherrilyn Rist Phone Number: [phone] 05/20/2019, 9:28 AM   Clinical Narrative:     CM talked to pt on 05/16/2019 and he refused all [place] services. CM will continue to follow for progression of care.   Final next level of care: Home/Self Care Barriers to Discharge: No Barriers Identified   Patient Goals and CMS Choice Patient states their goals for this hospitalization and ongoing recovery are:: to get better and stay at home CMS Medicare.gov Compare Post Acute Care list provided to:: Patient Choice offered to / list presented to : NA  Discharge Placement                       Discharge Plan and Services In-house Referral: NA Discharge Planning Services: CM Consult Post Acute Care Choice: NA          DME Arranged: N/A DME Agency: NA       HH Arranged: Patient Refused [place] Agency: NA        Social Determinants of Health (SDOH) Interventions     Readmission Risk Interventions No flowsheet data found.

## 2019-05-20 NOTE — Progress Notes (Signed)
Advanced Heart Failure Rounding Note   Subjective:    Underwent EGD and anoscopy on 6/1 with nonbleeding gastric ulcers (clipped) and hemorrhoids. Now back on heparin. No obvious bleeding.   Says breathing feels good. No orthopnea or PND. Weight up 4 pounds. Creatinine stable   Had brief run NSVT on tele. Asymptomatic    Objective:   Weight Range:  Vital Signs:   Temp:  [98.2 F (36.8 C)-99.7 F (37.6 C)] 99.7 F (37.6 C) (06/02 1119) Pulse Rate:  [60-90] 90 (06/02 1119) Resp:  [18-20] 18 (06/02 1119) BP: (101-111)/(52-71) 105/54 (06/02 1119) SpO2:  [96 %-99 %] 97 % (06/02 1119) Weight:  [115.9 kg] 115.9 kg (06/02 0600) Last BM Date: 05/18/19  Weight change: Filed Weights   05/19/19 0552 05/19/19 0830 05/20/19 0600  Weight: 114.2 kg 114.2 kg 115.9 kg    Intake/Output:   Intake/Output Summary (Last 24 hours) at 05/20/2019 1438 Last data filed at 05/20/2019 1343 Gross per 24 hour  Intake 1517.24 ml  Output 2000 ml  Net -482.76 ml     Physical Exam: General:  Well appearing. No resp difficulty HEENT: normal Neck: supple. no JVD. Carotids 2+ bilat; no bruits. No lymphadenopathy or thryomegaly appreciated. Cor: PMI nondisplaced. Irregular rate & rhythm. No rubs, gallops or murmurs. Lungs: clear Abdomen: soft, nontender, nondistended. No hepatosplenomegaly. No bruits or masses. Good bowel sounds. Extremities: no cyanosis, clubbing, rash, edema Neuro: alert & orientedx3, cranial nerves grossly intact. moves all 4 extremities w/o difficulty. Affect pleasant     Telemetry: AF 70-80 + PVCs 6 beat run NSVT Personally reviewed    Labs: Basic Metabolic Panel: Recent Labs  Lab 05/13/19 1649  05/14/19 0400 05/15/19 0357 05/16/19 0339 05/17/19 0356 05/18/19 0437 05/19/19 0355 05/20/19 0426  NA  --    < > 145 147* 141 139 140 138 138  K  --    < > 4.2 3.6 5.2* 3.9 3.8 3.8 3.9  CL  --    < > 105 106 104 102 103 104 103  CO2  --    < > 27 32 22 27 27 26 27    GLUCOSE  --    < > 123* 102* 141* 156* 131* 131* 140*  BUN  --    < > 41* 27* 26* 36* 44* 31* 24*  CREATININE  --    < > 2.03* 1.53* 1.38* 1.18 1.10 1.16 1.29*  CALCIUM  --    < > 9.2 9.0 9.4 9.1 8.8* 8.9 9.0  MG 2.3  --  2.3 2.2  --   --  1.8 1.9 1.8  PHOS 3.9  --  4.6 3.6  --   --   --  2.9 3.5   < > = values in this interval not displayed.    Liver Function Tests: Recent Labs  Lab 05/14/19 0400 05/15/19 0357 05/19/19 0355 05/20/19 0426  AST 36 37  --   --   ALT 47* 47*  --   --   ALKPHOS 80 101  --   --   BILITOT 1.0 1.3*  --   --   PROT 6.5 6.4*  --   --   ALBUMIN 3.1* 2.9* 2.5* 2.5*   No results for input(s): LIPASE, AMYLASE in the last 168 hours. No results for input(s): AMMONIA in the last 168 hours.  CBC: Recent Labs  Lab 05/15/19 0357 05/18/19 0437 05/18/19 1416 05/19/19 0355 05/19/19 1903 05/20/19 0426  WBC 13.3* 16.9* 15.8* 20.3*  --  22.2*  NEUTROABS  --   --   --   --   --  17.2*  HGB 12.5* 9.5* 10.3* 8.6* 9.5* 8.4*  HCT 39.5 29.1* 32.5* 26.9* 29.3* 26.0*  MCV 85.5 86.4 85.1 85.1  --  86.7  PLT 187 254 284 282  --  309    Cardiac Enzymes: No results for input(s): CKTOTAL, CKMB, CKMBINDEX, TROPONINI in the last 168 hours.  BNP: BNP (last 3 results) Recent Labs    11/29/18 0808 05/11/19 0844  BNP 1,232.9* 1,980.2*    ProBNP (last 3 results) No results for input(s): PROBNP in the last 8760 hours.    Other results:  Imaging: No results found.   Medications:     Scheduled Medications: . allopurinol  200 mg Oral Daily  . feeding supplement (ENSURE ENLIVE)  237 mL Oral BID BM  . furosemide  40 mg Oral BID  . guaiFENesin  600 mg Oral BID  . hydrocortisone   Rectal BID  . insulin aspart  0-20 Units Subcutaneous TID AC & HS  . isosorbide-hydrALAZINE  1 tablet Oral TID  . [START ON 05/21/2019] pantoprazole  40 mg Intravenous Q12H  . predniSONE  40 mg Oral Q breakfast  . sacubitril-valsartan  1 tablet Oral BID  . senna  1 tablet Oral BID   . spironolactone  12.5 mg Oral Daily    Infusions: . sodium chloride 20 mL/hr at 05/19/19 1432  . ceFEPime (MAXIPIME) IV 2 g (05/20/19 1343)  . heparin 2,000 Units/hr (05/20/19 0807)  . pantoprozole (PROTONIX) infusion 8 mg/hr (05/20/19 0810)    PRN Medications: acetaminophen, albuterol, benzonatate, hydrocortisone, sodium chloride flush   Assessment/plan:   1. Acute systolic HF/cardiogenic shock with biventricular HF - Echo 05/11/19 EF 20-25% with moderately decreased RV function  - Echo 12/19 EF 50% - Due to NICM (coronaries normal in 2016). Suspect due to probable tachy-induced CM (Was in AFL in 140s on admit) - Volume status continues to look good on lasix 80 bid (was on 40 po bid at home). Weight up a bit but doesn't look overloaded on exam. Will continue current dose and follow - Entresto 24/26 bid started 5/30. Tolerating well  - Continue Bidil and spiro. - No b-blocker with sinus and AV node dysfunction  2. PAFL/ AFIB - suspect this may be cause of CM  - however previous ECGs also suggest possible AV dissociation with accelerated junctional outpacing his sinus - Now in AF with rate control with HR in 70s mostly  - I have asked EP to see to question whether or not he may benefit from CRTpacing to help improve his CM and allow [place] to use AA as needed (versus AVN ablation). Will follow as outpatient - AC stopped due to GI bleeding results of endoscopy noted. Now back on heparin. If stable can restart warfarin per GI and primary team.  3. Acute hypoxic respiratory failure - likely due to pulmonary edema. Improved with diuresis. Also treated for PNA - extubated 5/27  4. AK on CKD 3 - baseline creatinine 1.8. creatinine 1.3 today  5. 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 LV apical thrombus - see AC discussion above.    8. Morbid obesity - will need weight loss   9. Hypomag - supped  10. R wrist pain - likely acute gout  - on prednisone 40 daily day 3/3.  - uric acid elevated - allopurinol 200 daily started.  11. GIB - per GI and primary team. Results as above  12. NSVT  - Keep K > 4.0 Mg > 2.0   Length of Stay: 9   Jordyn Doane MD 05/20/2019, 2:38 PM  Advanced Heart Failure Team Pager [phone] (M-F; [place])  Please contact [facility] for night-coverage after hours (4p -7a ) and weekends on [email or web address]

## 2019-05-20 NOTE — Progress Notes (Signed)
[name]  PROGRESS NOTE    [name]  ZOX:[id number] DOB: [birth year] DOA: 05/11/2019 PCP: [name], MD   Brief Narrative:   Jonathon Stingley Pattersonis a 58 y.o.malewith PMH of systolic heart failure (EF 15%), Aortic stenosis s/p replacement, ascending thoracic aortic aneurysm, pulmonary HTN, obesity, and HTN presentsto the emergency department for evaluation of shortness of breath. Symptoms have been worsening over the past 7days. He denies any fevers but has had some cough. He notes worsening swelling in the legs per EMS and reports that he has been compliant with his Lasix. Denies chest pain.EMS state that initially the patient appeared to have mild symptoms. He was placed on oxygen at home and allowed to gather several things around his room which she was able to walk and do. After walking, they report significantly worsening respiratory distress requiringnonrebreather.He noted significantly elevated blood pressures.They gave aspirin in route along with 1 nitroglycerin without significant change in symptoms.   Assessment & Plan:   Active Problems:   Acute systolic CHF (congestive heart failure) (HCC)   Cardiogenic shock (HCC)   Acute respiratory failure (HCC)   Pseudomonas pneumonia (HCC)   Complaint of melena   Acute gastric ulcer with hemorrhage   Internal and external bleeding hemorrhoids   Acute hypoxic respiratory failure requiring intubation  - due to CHF exacerbation with flash pulmonary edema and Pseudomonas pneumonia - grew Pseudomonas in sputum - Successfully extubated 5/27 - Wean oxygen; currentl on 3.5 liters [place]  - cefepime started 5/28 to finish out an additional 8 days of abx tx for PSA (End 07/18/18)  Acute systolic HF/Cardiogenic shock w/ BiV HF - Echo 05/11/19: EF 20-25%, mod decreased RVF - remains on heparin gtt; warfarin started (pharm dosing) - no BB d/t SA, AV nodal dysfxn - BiDil and aldactone -  Cardiology feels he will eventually need pacemaker  Afib - per cards; will d/w EP about CRT pacing per their note  LV apical thrombus - warfarin held d/t GIB     - can continue heparin at this time  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 4 hemostatic clips were used for hemostasis. Anoscopy was also performed that show external hemorroids     - per GI note: ok to resume heparin, hold on coumadin for now, PPI infusion to go for 72 hrs     - let's check BID Hgb for now; Hgb stable   DVT prophylaxis:Heparin Code Status:FULL Disposition Plan:TBD   Consultants:   Cardiology  GI  Procedures:   EGD  Antimicrobials:  . Cefepime    Subjective: "Thank you, doc. Y'all are really helping me."  Objective: Vitals:   05/19/19 1835 05/19/19 2130 05/20/19 0600 05/20/19 0622  BP: 111/71 (!) 103/52  (!) 101/56  Pulse: 84 60  60  Resp: 18 18  20   Temp: 98.2 F (36.8 C) 98.5 F (36.9 C)  98.4 F (36.9 C)  TempSrc: Oral Oral  Oral  SpO2: 99% 98%  96%  Weight:   115.9 kg   Height:        Intake/Output Summary (Last 24 hours) at 05/20/2019 0727 Last data filed at 05/20/2019 1478 Gross per 24 hour  Intake 1087.24 ml  Output 2000 ml  Net -912.76 ml   [name]  Weights   05/19/19 0552 05/19/19 0830 05/20/19 0600  Weight: 114.2 kg 114.2 kg 115.9 kg    Examination:  General exam:58 y.o.maleAppears calm and comfortable  Respiratory system: Clear to auscultationanteriorly, some crackles at bases Cardiovascular system:S1 &S2 heard, RRR. 1/6 SEM,No JVD, rubs, gallops or clicks.BLE edema Gastrointestinal system:Abdomen is nondistended, soft and nontender. No organomegaly or masses felt. Normal bowel sounds heard.  Central nervous system:Alert and oriented. No focal neurological deficits. Extremities: Symmetric 5 x 5 power.    Data Reviewed: I have personally reviewed following labs and imaging studies.  CBC: Recent Labs  Lab 05/15/19 0357 05/18/19 0437 05/18/19 1416 05/19/19 0355 05/19/19 1903 05/20/19 0426  WBC 13.3* 16.9* 15.8* 20.3*  --  22.2*  NEUTROABS  --   --   --   --   --  17.2*  HGB 12.5* 9.5* 10.3* 8.6* 9.5* 8.4*  HCT 39.5 29.1* 32.5* 26.9* 29.3* 26.0*  MCV 85.5 86.4 85.1 85.1  --  86.7  PLT 187 254 284 282  --  676   Basic Metabolic Panel: Recent Labs  Lab 05/13/19 1649  05/14/19 0400 05/15/19 0357 05/16/19 0339 05/17/19 0356 05/18/19 0437 05/19/19 0355 05/20/19 0426  NA  --    < > 145 147* 141 139 140 138 138  K  --    < > 4.2 3.6 5.2* 3.9 3.8 3.8 3.9  CL  --    < > 105 106 104 102 103 104 103  CO2  --    < > 27 32 22 27 27 26 27   GLUCOSE  --    < > 123* 102* 141* 156* 131* 131* 140*  BUN  --    < > 41* 27* 26* 36* 44* 31* 24*  CREATININE  --    < > 2.03* 1.53* 1.38* 1.18 1.10 1.16 1.29*  CALCIUM  --    < > 9.2 9.0 9.4 9.1 8.8* 8.9 9.0  MG 2.3  --  2.3 2.2  --   --  1.8 1.9 1.8  PHOS 3.9  --  4.6 3.6  --   --   --  2.9 3.5   < > = values in this interval not displayed.   GFR: Estimated Creatinine Clearance: 80.8 mL/min (A) (by C-G formula based on SCr of 1.29 mg/dL (H)). Liver Function Tests: Recent Labs  Lab 05/14/19 0400 05/15/19 0357 05/19/19 0355 05/20/19 0426  AST 36 37  --   --   ALT 47* 47*  --   --   ALKPHOS 80 101  --   --   BILITOT 1.0 1.3*  --   --   PROT 6.5 6.4*  --   --   ALBUMIN 3.1* 2.9* 2.5* 2.5*   No results for input(s): LIPASE, AMYLASE in the last 168 hours. No results for input(s): AMMONIA in the last 168 hours. Coagulation Profile: Recent Labs  Lab 05/16/19 0339 05/17/19 0356 05/18/19 0437 05/19/19 0355 05/20/19 0426  INR 1.3* 1.3* 1.3* 1.6* 1.5*   Cardiac Enzymes: No results for input(s): CKTOTAL, CKMB, CKMBINDEX,  TROPONINI in the last 168 hours. BNP (last 3 results) No results for input(s): PROBNP in the last 8760 hours. HbA1C: No results for input(s): HGBA1C in the last 72 hours. CBG: Recent Labs  Lab 05/18/19 2129 05/19/19 0619 05/19/19 1202 05/19/19 1544 05/20/19 0548  GLUCAP 164* 121* 162* 148* 165*   Lipid Profile: No results for input(s): CHOL, HDL, LDLCALC, TRIG, CHOLHDL, LDLDIRECT in the last 72 hours.  Thyroid Function Tests: No results for input(s): TSH, T4TOTAL, FREET4, T3FREE, THYROIDAB in the last 72 hours. Anemia Panel: No results for input(s): VITAMINB12, FOLATE, FERRITIN, TIBC, IRON, RETICCTPCT in the last 72 hours. Sepsis Labs: No results for input(s): PROCALCITON, LATICACIDVEN in the last 168 hours.  Recent Results (from the past 240 hour(s))  SARS Coronavirus 2 (CEPHEID- Performed in [place] hospital lab), Hosp Order     Status: None   Collection Time: 05/11/19  8:44 AM  Result Value Ref Range Status   SARS Coronavirus 2 NEGATIVE NEGATIVE Final    Comment: (NOTE) If result is NEGATIVE SARS-CoV-2 target nucleic acids are NOT DETECTED. The SARS-CoV-2 RNA is generally detectable in upper and lower  respiratory specimens during the acute phase of infection. The lowest  concentration of SARS-CoV-2 viral copies this assay can detect is 250  copies / mL. A negative result does not preclude SARS-CoV-2 infection  and should not be used as the sole basis for treatment or other  patient management decisions.  A negative result may occur with  improper specimen collection / handling, submission of specimen other  than nasopharyngeal swab, presence of viral mutation(s) within the  areas targeted by this assay, and inadequate number of viral copies  (<250 copies / mL). A negative result must be combined with clinical  observations, patient history, and epidemiological information. If result is POSITIVE SARS-CoV-2 target nucleic acids are DETECTED. The SARS-CoV-2 RNA is  generally detectable in upper and lower  respiratory specimens dur ing the acute phase of infection.  Positive  results are indicative of active infection with SARS-CoV-2.  Clinical  correlation with patient history and other diagnostic information is  necessary to determine patient infection status.  Positive results do  not rule out bacterial infection or co-infection with other viruses. If result is PRESUMPTIVE POSTIVE SARS-CoV-2 nucleic acids MAY BE PRESENT.   A presumptive positive result was obtained on the submitted specimen  and confirmed on repeat testing.  While 2019 novel coronavirus  (SARS-CoV-2) nucleic acids may be present in the submitted sample  additional confirmatory testing may be necessary for epidemiological  and / or clinical management purposes  to differentiate between  SARS-CoV-2 and other Sarbecovirus currently known to infect humans.  If clinically indicated additional testing with an alternate test  methodology [phone]) is advised. The SARS-CoV-2 RNA is generally  detectable in upper and lower respiratory sp ecimens during the acute  phase of infection. The expected result is Negative. Fact Sheet for Patients:  StrictlyIdeas.no Fact Sheet for Healthcare Providers: BankingDealers.co.za This test is not yet approved or cleared by the [place] FDA and has been authorized for detection and/or diagnosis of SARS-CoV-2 by FDA under an Emergency Use Authorization (EUA).  This EUA will remain in effect (meaning this test can be used) for the duration of the COVID-19 declaration under Section 564(b)(1) of the Act, 21 U.S.C. section 360bbb-3(b)(1), unless the authorization is terminated or revoked sooner. Performed at [facility], [place] [address] [place], [place] [address]   Respiratory Panel by PCR     Status: None   Collection Time: 05/11/19 10:15 AM  Result Value Ref Range Status   Adenovirus NOT  DETECTED NOT DETECTED Final   Coronavirus 229E NOT DETECTED NOT DETECTED Final    Comment: (NOTE) The Coronavirus on the Respiratory Panel, DOES NOT test for the novel  Coronavirus (2019 nCoV)    Coronavirus HKU1 NOT DETECTED NOT DETECTED Final   Coronavirus NL63 NOT DETECTED  NOT DETECTED Final   Coronavirus OC43 NOT DETECTED NOT DETECTED Final   Metapneumovirus NOT DETECTED NOT DETECTED Final   Rhinovirus / Enterovirus NOT DETECTED NOT DETECTED Final   Influenza A NOT DETECTED NOT DETECTED Final   Influenza B NOT DETECTED NOT DETECTED Final   Parainfluenza Virus 1 NOT DETECTED NOT DETECTED Final   Parainfluenza Virus 2 NOT DETECTED NOT DETECTED Final   Parainfluenza Virus 3 NOT DETECTED NOT DETECTED Final   Parainfluenza Virus 4 NOT DETECTED NOT DETECTED Final   Respiratory Syncytial Virus NOT DETECTED NOT DETECTED Final   Bordetella pertussis NOT DETECTED NOT DETECTED Final   Chlamydophila pneumoniae NOT DETECTED NOT DETECTED Final   Mycoplasma pneumoniae NOT DETECTED NOT DETECTED Final    Comment: Performed at [facility], [place] [address] [place], [place] [address]  MRSA PCR Screening     Status: Abnormal   Collection Time: 05/11/19 12:03 PM  Result Value Ref Range Status   MRSA by PCR POSITIVE (A) NEGATIVE Final    Comment:        The GeneXpert MRSA Assay (FDA approved for NASAL specimens only), is one component of a comprehensive MRSA colonization surveillance program. It is not intended to diagnose MRSA infection nor to guide or monitor treatment for MRSA infections. RESULT CALLED TO, READ BACK BY AND VERIFIED WITH: RN M CROSS [id number] AT [id number] BY CM Performed at [facility], [place] [address] [place], [place] [address]   Culture, blood (Routine X 2) w Reflex to ID Panel     Status: None   Collection Time: 05/11/19 12:17 PM  Result Value Ref Range Status   Specimen Description BLOOD BLOOD RIGHT HAND  Final   Special Requests AEROBIC BOTTLE ONLY Blood Culture  adequate volume  Final   Culture   Final    NO GROWTH 5 DAYS Performed at [facility], [place] [address] [place], [place] [address]    Report Status 05/16/2019 FINAL  Final  Culture, blood (Routine X 2) w Reflex to ID Panel     Status: None   Collection Time: 05/11/19 12:17 PM  Result Value Ref Range Status   Specimen Description BLOOD RIGHT ANTECUBITAL  Final   Special Requests   Final    AEROBIC BOTTLE ONLY Blood Culture results may not be optimal due to an inadequate volume of blood received in culture bottles   Culture   Final    NO GROWTH 5 DAYS Performed at [facility], [place] [address] [place], [place] [address]    Report Status 05/16/2019 FINAL  Final  Culture, respiratory (non-expectorated)     Status: None   Collection Time: 05/11/19 11:57 PM  Result Value Ref Range Status   Specimen Description TRACHEAL ASPIRATE  Final   Special Requests NONE  Final   Gram Stain   Final    ABUNDANT WBC PRESENT, PREDOMINANTLY PMN RARE GRAM NEGATIVE RODS RARE GRAM POSITIVE COCCI Performed at [facility], [place] [address], [place],  [address]    Culture RARE PSEUDOMONAS AERUGINOSA  Final   Report Status 05/15/2019 FINAL  Final   Organism ID, Bacteria PSEUDOMONAS AERUGINOSA  Final      Susceptibility   Pseudomonas aeruginosa - MIC*    CEFTAZIDIME 8 SENSITIVE Sensitive     CIPROFLOXACIN <=0.25 SENSITIVE Sensitive     GENTAMICIN <=1 SENSITIVE Sensitive     IMIPENEM 2 SENSITIVE Sensitive     PIP/TAZO 64 SENSITIVE Sensitive  CEFEPIME 4 SENSITIVE Sensitive     * RARE PSEUDOMONAS AERUGINOSA         Radiology Studies: No results found.      Scheduled Meds: . allopurinol  200 mg Oral Daily  . feeding supplement (ENSURE ENLIVE)  237 mL Oral BID BM  . furosemide  40 mg Oral BID  . guaiFENesin  600 mg Oral BID  . hydrocortisone   Rectal BID  . insulin aspart  0-20 Units Subcutaneous TID AC & HS  . isosorbide-hydrALAZINE  1 tablet Oral TID  . [START ON  05/22/2019] pantoprazole  40 mg Intravenous Q12H  . predniSONE  40 mg Oral Q breakfast  . sacubitril-valsartan  1 tablet Oral BID  . senna  1 tablet Oral BID  . spironolactone  12.5 mg Oral Daily   Continuous Infusions: . sodium chloride 20 mL/hr at 05/19/19 1432  . ceFEPime (MAXIPIME) IV 2 g (05/20/19 0610)  . heparin 2,100 Units/hr (05/20/19 0762)  . pantoprozole (PROTONIX) infusion 8 mg/hr (05/19/19 2203)     LOS: 9 days    Time spent: 25 minutes spent in the coordination of care today.    Jonnie Finner, DO [facility] Pager [phone]  If 7PM-7AM, please contact night-coverage [email or web address] Password [facility] 05/20/2019, 7:27 AM

## 2019-05-21 DIAGNOSIS — Z7901 Long term (current) use of anticoagulants: Secondary | ICD-10-CM

## 2019-05-21 LAB — BASIC METABOLIC PANEL
Anion gap: 10 (ref 5–15)
BUN: 22 mg/dL — ABNORMAL HIGH (ref 6–20)
CO2: 25 mmol/L (ref 22–32)
Calcium: 8.7 mg/dL — ABNORMAL LOW (ref 8.9–10.3)
Chloride: 102 mmol/L (ref 98–111)
Creatinine, Ser: 1.38 mg/dL — ABNORMAL HIGH (ref 0.61–1.24)
GFR calc Af Amer: >60 mL/min (ref >60)
GFR calc non Af Amer: 56 mL/min — ABNORMAL LOW (ref >60)
Glucose, Bld: 134 mg/dL — ABNORMAL HIGH (ref 70–99)
Potassium: 3.5 mmol/L (ref 3.5–5.1)
Sodium: 137 mmol/L (ref 135–145)

## 2019-05-21 LAB — CBC
HCT: 25.7 % — ABNORMAL LOW (ref 39.0–52.0)
Hemoglobin: 8.3 g/dL — ABNORMAL LOW (ref 13.0–17.0)
MCH: 27.9 pg (ref 26.0–34.0)
MCHC: 32.3 g/dL (ref 30.0–36.0)
MCV: 86.2 fL (ref 80.0–100.0)
Platelets: 347 10*3/uL (ref 150–400)
RBC: 2.98 MIL/uL — ABNORMAL LOW (ref 4.22–5.81)
RDW: 15.3 % (ref 11.5–15.5)
WBC: 23.8 10*3/uL — ABNORMAL HIGH (ref 4.0–10.5)
nRBC: 0.4 % — ABNORMAL HIGH (ref 0.0–0.2)

## 2019-05-21 LAB — GLUCOSE, CAPILLARY
Glucose-Capillary: 116 mg/dL — ABNORMAL HIGH (ref 70–99)
Glucose-Capillary: 152 mg/dL — ABNORMAL HIGH (ref 70–99)
Glucose-Capillary: 214 mg/dL — ABNORMAL HIGH (ref 70–99)
Glucose-Capillary: 231 mg/dL — ABNORMAL HIGH (ref 70–99)

## 2019-05-21 LAB — PROTIME-INR
INR: 1.3 — ABNORMAL HIGH (ref 0.8–1.2)
Prothrombin Time: 16.3 seconds — ABNORMAL HIGH (ref 11.4–15.2)

## 2019-05-21 LAB — HEPARIN LEVEL (UNFRACTIONATED)
Heparin Unfractionated: 0.97 IU/mL — ABNORMAL HIGH (ref 0.30–0.70)
Heparin Unfractionated: 1.12 IU/mL — ABNORMAL HIGH (ref 0.30–0.70)

## 2019-05-21 LAB — MAGNESIUM: Magnesium: 1.9 mg/dL (ref 1.7–2.4)

## 2019-05-21 MED ORDER — ADULT MULTIVITAMIN W/MINERALS CH
1.0000 | ORAL_TABLET | Freq: Every day | ORAL | Status: DC
  Administered 2019-05-21 – 2019-05-23 (×2): 1 via ORAL
  Filled 2019-05-21 (×3): qty 1

## 2019-05-21 MED ORDER — WARFARIN SODIUM 7.5 MG PO TABS
7.5000 mg | ORAL_TABLET | Freq: Once | ORAL | Status: AC
  Administered 2019-05-21: 7.5 mg via ORAL
  Filled 2019-05-21: qty 1

## 2019-05-21 MED ORDER — INSULIN ASPART 100 UNIT/ML ~~LOC~~ SOLN
0.0000 [IU] | Freq: Every day | SUBCUTANEOUS | Status: DC
  Administered 2019-05-21: 2 [IU] via SUBCUTANEOUS

## 2019-05-21 MED ORDER — INSULIN ASPART 100 UNIT/ML ~~LOC~~ SOLN
0.0000 [IU] | Freq: Three times a day (TID) | SUBCUTANEOUS | Status: DC
  Administered 2019-05-22: 7 [IU] via SUBCUTANEOUS
  Administered 2019-05-22: 3 [IU] via SUBCUTANEOUS

## 2019-05-21 MED ORDER — ENSURE MAX PROTEIN PO LIQD
11.0000 [oz_av] | Freq: Every day | ORAL | Status: DC
  Administered 2019-05-21 – 2019-05-23 (×3): 11 [oz_av] via ORAL
  Filled 2019-05-21 (×3): qty 330

## 2019-05-21 MED ORDER — WARFARIN - PHARMACIST DOSING INPATIENT: Freq: Every day | Status: DC

## 2019-05-21 MED ORDER — HEPARIN (PORCINE) 25000 UT/250ML-% IV SOLN
1350.0000 [IU]/h | INTRAVENOUS | Status: DC
  Administered 2019-05-21 (×2): 1600 [IU]/h via INTRAVENOUS
  Administered 2019-05-22: 1350 [IU]/h via INTRAVENOUS
  Filled 2019-05-21 (×3): qty 250

## 2019-05-21 MED ORDER — POTASSIUM CHLORIDE CRYS ER 20 MEQ PO TBCR
40.0000 meq | EXTENDED_RELEASE_TABLET | Freq: Once | ORAL | Status: AC
  Administered 2019-05-21: 40 meq via ORAL
  Filled 2019-05-21: qty 2

## 2019-05-21 NOTE — Progress Notes (Signed)
ANTICOAGULATION CONSULT NOTE  Pharmacy Consult for Heparin Indication: LV apical thrombus + Afib   Patient Measurements: Height: 5\' 11"  (180.3 cm) Weight: 255 lb 8 oz (115.9 kg) IBW/kg (Calculated) : 75.3 Heparin Dosing Weight: 100kg  Vital Signs: Temp: 98.3 F (36.8 C) (06/02 2316) Temp Source: Oral (06/02 2316) BP: 105/71 (06/02 2316) Pulse Rate: 70 (06/02 2316)  Labs: Recent Labs    05/19/19 0355 05/19/19 0356 05/19/19 1903 05/20/19 0426 05/21/19 0339  HGB 8.6*  --  9.5* 8.4* 8.3*  HCT 26.9*  --  29.3* 26.0* 25.7*  PLT 282  --   --  309 347  LABPROT 18.5*  --   --  18.3* 16.3*  INR 1.6*  --   --  1.5* 1.3*  HEPARINUNFRC  --  0.73*  --  0.66 0.97*  CREATININE 1.16  --   --  1.29*  --      Assessment: 51 yoM admitted with SOB requiring intubation. Labs reveal slightly elevated troponins and D-dimer >20. Baseline INR and aPTT normal, CBC wnl, no anticoagulation PTA. 5/24 ECHO showing small, fixed thrombus on apical wall of LV. Pharmacy consulted to dose Heparin for anticoagulation.  Heparin level 0.97, Hg 8.3, PTLC 347  Goal of Therapy:  Heparin level 0.3-0.7 units/ml  INR 2-3 Monitor platelets by anticoagulation protocol: Yes   Plan:  Decrease heparin drip 1800 units/hr Check heparin level 6 hours after rate change Will continue to monitor for any bleeding complications  Thanks for allowing pharmacy to be a part of this patient's care.  Excell Seltzer, PharmD Clinical Pharmacist

## 2019-05-21 NOTE — Progress Notes (Signed)
      Progress Note   Subjective  Patient feels well, ambulating. Stools are lightening up. Wants to go home.    Objective   Vital signs in last 24 hours: Temp:  [98 F (36.7 C)-99.7 F (37.6 C)] 99.5 F (37.5 C) (06/03 0516) Pulse Rate:  [37-90] 67 (06/03 0517) Resp:  [16-20] 20 (06/03 0516) BP: (104-111)/(53-71) 111/63 (06/03 0516) SpO2:  [97 %-99 %] 97 % (06/03 0517) Weight:  [116.1 kg] 116.1 kg (06/03 0516) Last BM Date: 05/19/19 General:    AA male in NAD Heart: irregular rate and rhythm; Lungs: Respirations even and unlabored, Abdomen:  Soft, nontender and nondistended. . Extremities:  Without edema. Neurologic:  Alert and oriented,  grossly normal neurologically. Psych:  Cooperative. Normal mood and affect.  Intake/Output from previous day: 06/02 0701 - 06/03 0700 In: 1610 [P.O.:1610] Out: 2450 [Urine:2450] Intake/Output this shift: No intake/output data recorded.  Lab Results: Recent Labs    05/19/19 0355 05/19/19 1903 05/20/19 0426 05/21/19 0339  WBC 20.3*  --  22.2* 23.8*  HGB 8.6* 9.5* 8.4* 8.3*  HCT 26.9* 29.3* 26.0* 25.7*  PLT 282  --  309 347   BMET Recent Labs    05/19/19 0355 05/20/19 0426 05/21/19 0339  NA 138 138 137  K 3.8 3.9 3.5  CL 104 103 102  CO2 26 27 25   GLUCOSE 131* 140* 134*  BUN 31* 24* 22*  CREATININE 1.16 1.29* 1.38*  CALCIUM 8.9 9.0 8.7*   LFT Recent Labs    05/20/19 0426  ALBUMIN 2.5*   PT/INR Recent Labs    05/20/19 0426 05/21/19 0339  LABPROT 18.3* 16.3*  INR 1.5* 1.3*    Studies/Results: No results found.     Assessment / Plan:    58 y/o male with CHF, cardiogenic shock, atrial fibrillation, LV apical thrombus, developed GI bleeding due to gastric ulcers. EGD on 6/1 showed multiple antral ulcers with adherent clot, treated with hemostasis clips. Since procedure Hgb stable and BUN downtrending, he has been on heparin since the exam. H pylori IgG pending.   Recommend: - okay to resume Warfarin  today if that is the plan per cardiology - continue IV protonix through tomorrow (6/4), then protonix 40mg  BID which he will be on for the next 6 weeks, and then once daily - await H pylori IgG, treat if positive - trend Hgb, contact us if evidence of rebleeding - he will need outpatient follow up with our office in the upcoming months to discuss possible repeat EGD to assess for mucosal healing of ulcers, although doing this will depend on his cardiac status at that time.  Please call with questions.  Clontarf Cellar, MD [facility]

## 2019-05-21 NOTE — Progress Notes (Signed)
PROGRESS NOTE    [name]  CXK:[id number] DOB: [birth year] DOA: 05/11/2019 PCP: [name], MD   Brief Narrative:  Jonathon Yapp Pattersonis [name] 58 y.o.malewith PMH of systolic heart failure (EF 15%), Aortic stenosis s/p replacement, ascending thoracic aortic aneurysm, pulmonary HTN, obesity, and HTN presentsto the emergency department for evaluation of shortness of breath. Symptoms have been worsening over the past 7days. He denies any fevers but has had some cough. He notes worsening swelling in the legs per EMS and reports that he has been compliant with his Lasix. Denies chest pain.EMS state that initially the patient appeared to have mild symptoms. He was placed on oxygen at home and allowed to gather several things around his room which she was able to walk and do. After walking, they report significantly worsening respiratory distress requiringnonrebreather.He noted significantly elevated blood pressures.They gave aspirin in route along with 1 nitroglycerin without significant change in symptoms.  Assessment & Plan:   Active Problems:   Acute systolic CHF (congestive heart failure) (HCC)   Cardiogenic shock (HCC)   Acute respiratory failure (HCC)   Pseudomonas pneumonia (HCC)   Complaint of melena   Acute gastric ulcer with hemorrhage   Internal and external bleeding hemorrhoids   Acute hypoxic respiratory failure requiring intubation  - due to CHF exacerbation with flash pulmonary edema and Pseudomonas pneumonia - grew Pseudomonas in sputum - Successfully extubated 5/27 - Wean oxygen; currently on RA - cefepime started 5/28 to finish out an additional 8 days of abx tx for PSA (End 05/22/19) - Repeat CXR on 6/4  Acute systolic HF/Cardiogenic shock w/ BiV HF - Echo 05/11/19: EF 20-25%, mod decreased RVF (see report) - remains on heparin gtt; warfarin restarted (pharm dosing) - no BB d/t SA, AV nodal dysfxn - BiDil and aldactone - lasix 40 BID PO -  Cardiology feels he will eventually need pacemaker  Afib - per cards; will d/w EP about CRT pacing per their note (looks like plan for outpatient follow up?) - Currently on heparin gtt, warfarin has been restarted  LV apical thrombus - warfarin restarted - continue heparin   GI Bleed  Melena: EGD with nonbleeding gastric ulcers with adherent clot.  S/p clipping.  Per GI, suspect stress related ulcers.  Also with hemorrhoids on anoscopy.   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 Small, fixed thrombus on the apical wall of the left ventricle.  2. The left ventricle has severely reduced systolic function, with an ejection fraction of 20-25%. The cavity size was moderately dilated. There is moderate concentric left ventricular hypertrophy. Indeterminate diastolic filling due to [name] fusion. Left  ventricular diffuse hypokinesis.  3. The right ventricle has moderately reduced systolic function. The cavity was mildly enlarged. There is mildly increased right ventricular wall thickness. Right ventricular systolic pressure could not be assessed.  4. Left atrial size was severely dilated.  5. Right atrial size was severely  dilated.  6. Trivial pericardial effusion is present.  7. The mitral valve is myxomatous. Mild thickening of the mitral valve leaflet. No evidence of mitral valve stenosis.  8. The tricuspid valve is  grossly normal.  9. [name] 43mm an Edwards bioprosthesis valve is present in the aortic position. Procedure Date: 2016.  EGD 6/1 Impression - Non-bleeding gastric ulcers with adherent clot. Clips (MR conditional) were placed. 1 clip each ulcer - 2 cliups misfired - 6 used total. - Erythematous mucosa in the prepyloric region - Erythematous mucosa in the prepyloric region of the stomach. - The examination was otherwise normal. - No specimens collected. ANOSCOPY ALSO DONE - INFLAMED GRADE 2 INTERNAL AND EXTERNAL HEMORRHOIDS THAT ARE CAUSING RECTAL BLEEDING Recommendatinos - Return patient to hospital ward for ongoing care. - Cardiac diet. - Continue present medications. - STARTED PPI INFUSION WHICH I WOULD CONTINUE X 48 -72 HOURS OK TO RESUME HEPARIN BUT NEED TO SEE HOW HE WILL DO BEFORE RESTARTING WARFARIN PRESUME THESE ARE STRESS-RELATED ULCERS, ? SOME CONTRIBUTION FROM STEROIDS BUT SUSPECT PRIMARY INSULT WAS STRESS FROM OTHER ILLNESS, HYPOTENSION, ETC TREAT HEMORRHOIDS WITH HC CREAM  Antimicrobials:  Anti-infectives (From admission, onward)   Start     Dose/Rate Route Frequency Ordered Stop   05/15/19 0930  ceFEPIme (MAXIPIME) 2 g in sodium chloride 0.9 % 100 mL IVPB     2 g 200 mL/hr over 30 Minutes Intravenous Every 8 hours 05/15/19 0919 05/22/19 2359   05/11/19 1100  azithromycin (ZITHROMAX) 500 mg in sodium chloride 0.9 % 250 mL IVPB  Status:  Discontinued     500 mg 250 mL/hr over 60 Minutes Intravenous Every 24 hours 05/11/19 1029 05/15/19 0915   05/11/19 1045  cefTRIAXone (ROCEPHIN) 2 g in sodium chloride 0.9 % 100 mL IVPB  Status:  Discontinued     2 g 200 mL/hr over 30 Minutes Intravenous Every 24 hours 05/11/19 1035 05/15/19 0915     Subjective: Feels ok No CP or SOB. Still dark stools, but improving.   Objective: Vitals:   05/20/19 2316 05/21/19 0516 05/21/19 0517 05/21/19 1330  BP: 105/71 111/63  (!) 96/46  Pulse: 70 (!) 37 67 70  Resp: 16 20  20   Temp: 98.3  F (36.8 C) 99.5 F (37.5 C)  98 F (36.7 C)  TempSrc: Oral Oral  Oral  SpO2: 97% 98% 97% 96%  Weight:  116.1 kg    Height:        Intake/Output Summary (Last 24 hours) at 05/21/2019 1656 Last data filed at 05/21/2019 1645 Gross per 24 hour  Intake 880 ml  Output 2150 ml  Net -1270 ml   Filed Weights   05/19/19 0830 05/20/19 0600 05/21/19 0516  Weight: 114.2 kg 115.9 kg 116.1 kg    Examination:  General exam: Appears calm and comfortable  Respiratory system: Clear to auscultation. Respiratory effort normal. Cardiovascular system: S1 & S2 heard, RRR.  Gastrointestinal system: Abdomen is nondistended, soft and nontender.  Central nervous system: Alert and oriented. No focal neurological deficits. Extremities: no LEE Skin: No rashes, lesions or ulcers Psychiatry: Judgement and insight appear normal. Mood & affect appropriate.     Data Reviewed: I have personally reviewed following labs and imaging studies  CBC: Recent Labs  Lab 05/18/19 0437 05/18/19 1416 05/19/19 0355 05/19/19 1903 05/20/19 0426 05/21/19 0339  WBC 16.9* 15.8* 20.3*  --  22.2* 23.8*  NEUTROABS  --   --   --   --  17.2*  --  HGB 9.5* 10.3* 8.6* 9.5* 8.4* 8.3*  HCT 29.1* 32.5* 26.9* 29.3* 26.0* 25.7*  MCV 86.4 85.1 85.1  --  86.7 86.2  PLT 254 284 282  --  309 696   Basic Metabolic Panel: Recent Labs  Lab 05/15/19 0357  05/17/19 0356 05/18/19 0437 05/19/19 0355 05/20/19 0426 05/21/19 0339  NA 147*   < > 139 140 138 138 137  K 3.6   < > 3.9 3.8 3.8 3.9 3.5  CL 106   < > 102 103 104 103 102  CO2 32   < > 27 27 26 27 25   GLUCOSE 102*   < > 156* 131* 131* 140* 134*  BUN 27*   < > 36* 44* 31* 24* 22*  CREATININE 1.53*   < > 1.18 1.10 1.16 1.29* 1.38*  CALCIUM 9.0   < > 9.1 8.8* 8.9 9.0 8.7*  MG 2.2  --   --  1.8 1.9 1.8 1.9  PHOS 3.6  --   --   --  2.9 3.5  --    < > = values in this interval not displayed.   GFR: Estimated Creatinine Clearance: 75.6 mL/min ([place] Seg[place]) (by C-G formula based on  SCr of 1.38 mg/dL (H)). Liver Function Tests: Recent Labs  Lab 05/15/19 0357 05/19/19 0355 05/20/19 0426  AST 37  --   --   ALT 47*  --   --   ALKPHOS 101  --   --   BILITOT 1.3*  --   --   PROT 6.4*  --   --   ALBUMIN 2.9* 2.5* 2.5*   No results for input(s): LIPASE, AMYLASE in the last 168 hours. No results for input(s): AMMONIA in the last 168 hours. Coagulation Profile: Recent Labs  Lab 05/17/19 0356 05/18/19 0437 05/19/19 0355 05/20/19 0426 05/21/19 0339  INR 1.3* 1.3* 1.6* 1.5* 1.3*   Cardiac Enzymes: No results for input(s): CKTOTAL, CKMB, CKMBINDEX, TROPONINI in the last 168 hours. BNP (last 3 results) No results for input(s): PROBNP in the last 8760 hours. HbA1C: No results for input(s): HGBA1C in the last 72 hours. CBG: Recent Labs  Lab 05/20/19 1611 05/20/19 2057 05/21/19 0552 05/21/19 1321 05/21/19 1634  GLUCAP 223* 198* 116* 152* 214*   Lipid Profile: No results for input(s): CHOL, HDL, LDLCALC, TRIG, CHOLHDL, LDLDIRECT in the last 72 hours. Thyroid Function Tests: No results for input(s): TSH, T4TOTAL, FREET4, T3FREE, THYROIDAB in the last 72 hours. Anemia Panel: No results for input(s): VITAMINB12, FOLATE, FERRITIN, TIBC, IRON, RETICCTPCT in the last 72 hours. Sepsis Labs: No results for input(s): PROCALCITON, LATICACIDVEN in the last 168 hours.  Recent Results (from the past 240 hour(s))  Culture, respiratory (non-expectorated)     Status: None   Collection Time: 05/11/19 11:57 PM  Result Value Ref Range Status   Specimen Description TRACHEAL ASPIRATE  Final   Special Requests NONE  Final   Gram Stain   Final    ABUNDANT WBC PRESENT, PREDOMINANTLY PMN RARE GRAM NEGATIVE RODS RARE GRAM POSITIVE COCCI Performed at [facility], [address] [place], [place] [address]    Culture RARE PSEUDOMONAS AERUGINOSA  Final   Report Status 05/15/2019 FINAL  Final   Organism ID, Bacteria PSEUDOMONAS AERUGINOSA  Final      Susceptibility    Pseudomonas aeruginosa - MIC*    CEFTAZIDIME 8 SENSITIVE Sensitive     CIPROFLOXACIN <=0.25 SENSITIVE Sensitive     GENTAMICIN <=1 SENSITIVE Sensitive  IMIPENEM 2 SENSITIVE Sensitive     PIP/TAZO 64 SENSITIVE Sensitive     CEFEPIME 4 SENSITIVE Sensitive     * RARE PSEUDOMONAS AERUGINOSA         Radiology Studies: No results found.      Scheduled Meds: . allopurinol  200 mg Oral Daily  . furosemide  40 mg Oral BID  . guaiFENesin  600 mg Oral BID  . hydrocortisone   Rectal BID  . insulin aspart  0-20 Units Subcutaneous TID AC & HS  . isosorbide-hydrALAZINE  1 tablet Oral TID  . multivitamin with minerals  1 tablet Oral Daily  . pantoprazole  40 mg Intravenous Q12H  . predniSONE  40 mg Oral Q breakfast  . Ensure Max Protein  11 oz Oral Daily  . sacubitril-valsartan  1 tablet Oral BID  . senna  1 tablet Oral BID  . spironolactone  12.5 mg Oral Daily  . warfarin  7.5 mg Oral ONCE-1800  . Warfarin - Pharmacist Dosing Inpatient   Does not apply q1800   Continuous Infusions: . sodium chloride 20 mL/hr at 05/19/19 1432  . ceFEPime (MAXIPIME) IV 2 g (05/21/19 1500)  . heparin 1,600 Units/hr (05/21/19 1512)     LOS: 10 days    Time spent: over 30 min    Fayrene Helper, MD [facility] Pager AMION  If 7PM-7AM, please contact night-coverage [email or web address] Password [facility] 05/21/2019, 4:56 PM

## 2019-05-21 NOTE — Progress Notes (Signed)
Pt continues to brady down into the low 40s, high 30s. MD notified. Will continue to monitor pt

## 2019-05-21 NOTE — Discharge Instructions (Signed)

## 2019-05-21 NOTE — Progress Notes (Addendum)
ANTICOAGULATION CONSULT NOTE  Pharmacy Consult for Heparin Indication: LV apical thrombus + Afib   Patient Measurements: Height: 5\' 11"  (180.3 cm) Weight: 256 lb (116.1 kg) IBW/kg (Calculated) : 75.3 Heparin Dosing Weight: 100kg  Vital Signs: Temp: 99.5 F (37.5 C) (06/03 0516) Temp Source: Oral (06/03 0516) BP: 111/63 (06/03 0516) Pulse Rate: 67 (06/03 0517)  Labs: Recent Labs    05/19/19 0355  05/19/19 1903 05/20/19 0426 05/21/19 0339 05/21/19 1137  HGB 8.6*  --  9.5* 8.4* 8.3*  --   HCT 26.9*  --  29.3* 26.0* 25.7*  --   PLT 282  --   --  309 347  --   LABPROT 18.5*  --   --  18.3* 16.3*  --   INR 1.6*  --   --  1.5* 1.3*  --   HEPARINUNFRC  --    < >  --  0.66 0.97* 1.12*  CREATININE 1.16  --   --  1.29* 1.38*  --    < > = values in this interval not displayed.     Assessment: 5 yoM admitted with SOB requiring intubation. Labs reveal slightly elevated troponins and D-dimer >20. Baseline INR and aPTT normal, CBC wnl, no anticoagulation PTA. 5/24 ECHO showing small, fixed thrombus on apical wall of LV. Pharmacy consulted to dose Heparin for anticoagulation.   Heparin is running into peripheral line in L upper arm and heparin level drawn from opposite R hand. INR 1.3 today - last dose of warfarin on 5/30. Oral intake documented at 100%. Hgb 8.3, plt 347. No s/sx of bleeding. No infusion issues.   Goal of Therapy:  Heparin level 0.3-0.7 units/ml  INR 2-3 Monitor platelets by anticoagulation protocol: Yes   Plan:  Warfarin 7.5 mg tonight  Hold heparin infusion for 1 hour Decrease heparin drip 1600 units/hr Check heparin level 6 hours after rate change Will continue to monitor for any bleeding complications  Thanks for allowing pharmacy to be a part of this patient's care.  Antonietta Jewel, PharmD, [place] Clinical Pharmacist  Pager: [phone] Phone: [phone]

## 2019-05-21 NOTE — Progress Notes (Signed)
Nutrition Follow-up  RD working remotely.  DOCUMENTATION CODES:   Obesity unspecified  INTERVENTION:   -D/c Ensure Enlive po BID, each supplement provides 350 kcal and 20 grams of protein -Ensure Max po daily, each supplement provides 150 kcal and 30 grams of protein -MVI with minerals daily  NUTRITION DIAGNOSIS:   Inadequate oral intake related to acute illness as evidenced by NPO status.  Progressing; advanced to heart healthy diet  GOAL:   Patient will meet greater than or equal to 90% of their needs  Progressing  MONITOR:   PO intake, Supplement acceptance, Labs, Weight trends  REASON FOR ASSESSMENT:   Ventilator    ASSESSMENT:   58 yo male admitted with acute respiratory failure requiring intubation, cardiogenic shock with known heart failure EF 20-25. PMH includes CHF, HTN, CKD III, DM  5/24 Intubated 5/26 Developed acute respiratory distress likely related to mucous plugging, possible flash pulmonary edema, TF started 5/27 Extubated, diet advanced to Heart Healthy 6/1 s/p EGD and anoscopy- revealed clipped non-bleeding GUs with clot, gastritis and gr 2 internal and external hemorrhoids, thought to be cause of rectal bleeding  Reviewed I/O's: -840 ml x 24 hours and -5.5 L since admission  UOP: 2.4 L x 24 hours  Pt with good appetite, consuming 100% of meals. Pt has Ensure supplements ordered, however, has refused two of the past 3 doses.   Medications reviewed and include prednisone. This may be contributing to elevated CBGS.   Labs reviewed: CBGS: 110-223 (inpatient orders for glycemic control are 0-20 units insulin aspart TID with meals and q HS  Diet Order:   Diet Order            Diet Heart Room service appropriate? Yes; Fluid consistency: Thin  Diet effective now              EDUCATION NEEDS:   Not appropriate for education at this time  Skin:  Skin Assessment: Reviewed RN Assessment  Last BM:  05/19/19  Height:   Ht Readings from  Last 1 Encounters:  05/19/19 5\' 11"  (1.803 m)    Weight:   Wt Readings from Last 1 Encounters:  05/21/19 116.1 kg    Ideal Body Weight:  78.2 kg  BMI:  Body mass index is 35.7 kg/m.  Estimated Nutritional Needs:   Kcal:  2200-2400 kcals   Protein:  110-120 g   Fluid:  >/= 2 L    [name] A. Jimmye Norman, RD, LDN, [place] Registered Dietitian II Certified Diabetes Care and Education Specialist Pager: [phone] After hours Pager: [phone]

## 2019-05-21 NOTE — Progress Notes (Signed)
Advanced Heart Failure Rounding Note   Subjective:    Underwent EGD and anoscopy on 6/1 with nonbleeding gastric ulcers (clipped) and hemorrhoids. Now back on heparin. No obvious bleeding.   Feels good. Wants to go home. No SOB, orthopnea or PND. Weight stable. Renal function stable.  Objective:   Weight Range:  Vital Signs:   Temp:  [98 F (36.7 C)-99.5 F (37.5 C)] 98 F (36.7 C) (06/03 1330) Pulse Rate:  [37-70] 70 (06/03 1330) Resp:  [16-20] 20 (06/03 1330) BP: (96-111)/(46-71) 96/46 (06/03 1330) SpO2:  [96 %-99 %] 96 % (06/03 1330) Weight:  [116.1 kg] 116.1 kg (06/03 0516) Last BM Date: 05/19/19  Weight change: Filed Weights   05/19/19 0830 05/20/19 0600 05/21/19 0516  Weight: 114.2 kg 115.9 kg 116.1 kg    Intake/Output:   Intake/Output Summary (Last 24 hours) at 05/21/2019 1410 Last data filed at 05/21/2019 0700 Gross per 24 hour  Intake 640 ml  Output 2150 ml  Net -1510 ml     Physical Exam: General:  Well appearing. No resp difficulty HEENT: normal Neck: supple. no JVD. Carotids 2+ bilat; no bruits. No lymphadenopathy or thryomegaly appreciated. Cor: PMI nondisplaced. Irregular rate & rhythm. No rubs, gallops or murmurs. Lungs: clear Abdomen: soft, nontender, nondistended. No hepatosplenomegaly. No bruits or masses. Good bowel sounds. Extremities: no cyanosis, clubbing, rash, edema Neuro: alert & orientedx3, cranial nerves grossly intact. moves all 4 extremities w/o difficulty. Affect pleasant   Telemetry: AF 70-80 + PVCs Personally reviewed    Labs: Basic Metabolic Panel: Recent Labs  Lab 05/15/19 0357  05/17/19 0356 05/18/19 0437 05/19/19 0355 05/20/19 0426 05/21/19 0339  NA 147*   < > 139 140 138 138 137  K 3.6   < > 3.9 3.8 3.8 3.9 3.5  CL 106   < > 102 103 104 103 102  CO2 32   < > 27 27 26 27 25   GLUCOSE 102*   < > 156* 131* 131* 140* 134*  BUN 27*   < > 36* 44* 31* 24* 22*  CREATININE 1.53*   < > 1.18 1.10 1.16 1.29* 1.38*   CALCIUM 9.0   < > 9.1 8.8* 8.9 9.0 8.7*  MG 2.2  --   --  1.8 1.9 1.8 1.9  PHOS 3.6  --   --   --  2.9 3.5  --    < > = values in this interval not displayed.    Liver Function Tests: Recent Labs  Lab 05/15/19 0357 05/19/19 0355 05/20/19 0426  AST 37  --   --   ALT 47*  --   --   ALKPHOS 101  --   --   BILITOT 1.3*  --   --   PROT 6.4*  --   --   ALBUMIN 2.9* 2.5* 2.5*   No results for input(s): LIPASE, AMYLASE in the last 168 hours. No results for input(s): AMMONIA in the last 168 hours.  CBC: Recent Labs  Lab 05/18/19 0437 05/18/19 1416 05/19/19 0355 05/19/19 1903 05/20/19 0426 05/21/19 0339  WBC 16.9* 15.8* 20.3*  --  22.2* 23.8*  NEUTROABS  --   --   --   --  17.2*  --   HGB 9.5* 10.3* 8.6* 9.5* 8.4* 8.3*  HCT 29.1* 32.5* 26.9* 29.3* 26.0* 25.7*  MCV 86.4 85.1 85.1  --  86.7 86.2  PLT 254 284 282  --  309 347    Cardiac Enzymes: No results  for input(s): CKTOTAL, CKMB, CKMBINDEX, TROPONINI in the last 168 hours.  BNP: BNP (last 3 results) Recent Labs    11/29/18 0808 05/11/19 0844  BNP 1,232.9* 1,980.2*    ProBNP (last 3 results) No results for input(s): PROBNP in the last 8760 hours.    Other results:  Imaging: No results found.   Medications:     Scheduled Medications: . allopurinol  200 mg Oral Daily  . furosemide  40 mg Oral BID  . guaiFENesin  600 mg Oral BID  . hydrocortisone   Rectal BID  . insulin aspart  0-20 Units Subcutaneous TID AC & HS  . isosorbide-hydrALAZINE  1 tablet Oral TID  . multivitamin with minerals  1 tablet Oral Daily  . pantoprazole  40 mg Intravenous Q12H  . predniSONE  40 mg Oral Q breakfast  . Ensure Max Protein  11 oz Oral Daily  . sacubitril-valsartan  1 tablet Oral BID  . senna  1 tablet Oral BID  . spironolactone  12.5 mg Oral Daily    Infusions: . sodium chloride 20 mL/hr at 05/19/19 1432  . ceFEPime (MAXIPIME) IV 2 g (05/21/19 0503)  . heparin      PRN Medications: acetaminophen, albuterol,  benzonatate, hydrocortisone, sodium chloride flush   Assessment/plan:   1. Acute systolic HF/cardiogenic shock with biventricular HF - Echo 05/11/19 EF 20-25% with moderately decreased RV function  - Echo 12/19 EF 50% - Due to NICM (coronaries normal in 2016). Suspect due to probable tachy-induced CM (Was in AFL in 140s on admit) - Volume status continues to look good on lasix 80 bid (was on 40 po bid at home). Weight stable. Continue current HF meds - BP too soft to titrate.  - Entresto 24/26 bid started 5/30. Tolerating well  - Continue Bidil and spiro. - No b-blocker with sinus and AV node dysfunction  2. PAFL/ AFIB - suspect this may be cause of CM  - however previous ECGs also suggest possible AV dissociation with accelerated junctional outpacing his sinus - Now in AF with rate control with HR in 70s mostly  - I have asked EP to see to question whether or not he may benefit from CRTpacing to help improve his CM and allow [place] to use AA as needed (versus AVN ablation). Will follow as outpatient - AC stopped due to GI bleeding results of endoscopy noted. Now back on heparin.  - Per GI ok to restart warfarin   3. Acute hypoxic respiratory failure - likely due to pulmonary edema. Improved with diuresis. Also treated for PNA - extubated 5/27  4. AK on CKD 3 - baseline creatinine 1.8. creatinine stable 1.38 today  5. 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 LV apical thrombus - see AC discussion above.    8. Morbid obesity - will need weight loss   9. Hypomag - supped  10. R wrist pain - likely acute gout - resolved with prednisone - allopurinol 200 daily started.   11. GIB - per GI and primary team. Results as above  12. NSVT  - Keep K > 4.0 Mg > 2.0 . supp K   Length of Stay: 10   Glori Bickers MD 05/21/2019, 2:10 PM  Advanced Heart Failure Team Pager [phone] (M-F; [place])  Please contact [facility] for  night-coverage after hours (4p -7a ) and weekends on [email or web address]

## 2019-05-21 NOTE — Progress Notes (Signed)
Physical Therapy Treatment Patient Details Name: [name] MRN: [id number] DOB: [birth year] Today's Date: 05/21/2019    History of Present Illness Pt adm with acute respiratory failure due to decompensated heart failure. Intubated 5/24 - 5/28. PMH - CHF, AVR, HTN, obesity, CKD    PT Comments    Pt continues to improve in his ambulation and safety. Pt is min guard for transfers and supervision for ambulation >1000 feet without AD. PT is no longer recommending further PT services at discharge. Pt is hopeful for d/c home on Friday.   Follow Up Recommendations  Supervision - Intermittent     Equipment Recommendations  None recommended by PT       Precautions / Restrictions Precautions Precautions: Fall Restrictions Weight Bearing Restrictions: No    Mobility  Bed Mobility               General bed mobility comments: sitting EoB on entry   Transfers Overall transfer level: Needs assistance Equipment used: None Transfers: Sit to/from Stand;Stand Pivot Transfers Sit to Stand: Min guard         General transfer comment: min guard for safety, good power up and steadying  Ambulation/Gait Ambulation/Gait assistance: Supervision Gait Distance (Feet): 1500 Feet Assistive device: None Gait Pattern/deviations: Step-through pattern;Drifts right/left Gait velocity: decr Gait velocity interpretation: >4.37 ft/sec, indicative of normal walking speed General Gait Details: supervision for gait, pt ambulation steadier with better fitting shoes today         Balance Overall balance assessment: Needs assistance Sitting-balance support: No upper extremity supported;Feet supported Sitting balance-Leahy Scale: Good     Standing balance support: Bilateral upper extremity supported;During functional activity Standing balance-Leahy Scale: Fair                              Cognition Arousal/Alertness: Awake/alert Behavior During Therapy: WFL for tasks  assessed/performed Overall Cognitive Status: No family/caregiver present to determine baseline cognitive functioning                                           General Comments General comments (skin integrity, edema, etc.): VSS      Pertinent Vitals/Pain Pain Assessment: No/denies pain           PT Goals (current goals can now be found in the care plan section) Acute Rehab PT Goals Patient Stated Goal: return home PT Goal Formulation: With patient Time For Goal Achievement: 05/29/19 Potential to Achieve Goals: Good Progress towards PT goals: Progressing toward goals    Frequency    Min 3X/week      PT Plan Discharge plan needs to be updated    Co-evaluation PT/OT/SLP Co-Evaluation/Treatment: Yes            AM-PAC PT "6 Clicks" Mobility   Outcome Measure  Help needed turning from your back to your side while in a flat bed without using bedrails?: None Help needed moving from lying on your back to sitting on the side of a flat bed without using bedrails?: None Help needed moving to and from a bed to a chair (including a wheelchair)?: None Help needed standing up from a chair using your arms (e.g., wheelchair or bedside chair)?: None Help needed to walk in hospital room?: None Help needed climbing 3-5 steps with a railing? : A Little 6 Click Score: 23  End of Session Equipment Utilized During Treatment: Other (comment)(pt refuses gait belt) Activity Tolerance: Patient tolerated treatment well Patient left: in chair;with call bell/phone within reach;with chair alarm set Nurse Communication: Mobility status PT Visit Diagnosis: Unsteadiness on feet (R26.81);Other abnormalities of gait and mobility (R26.89);Muscle weakness (generalized) (M62.81)     Time: 2957-4734 PT Time Calculation (min) (ACUTE ONLY): 19 min  Charges:  $Gait Training: 8-22 mins                     Niclas Markell B. Migdalia Dk PT, DPT Acute Rehabilitation Services Pager  [phone] Office [phone]    [place] 05/21/2019, 4:46 PM

## 2019-05-22 ENCOUNTER — Other Ambulatory Visit: Payer: Self-pay

## 2019-05-22 ENCOUNTER — Encounter (HOSPITAL_COMMUNITY): Payer: Self-pay | Admitting: General Practice

## 2019-05-22 ENCOUNTER — Inpatient Hospital Stay (HOSPITAL_COMMUNITY): Payer: Medicare Other

## 2019-05-22 LAB — CBC
HCT: 25.4 % — ABNORMAL LOW (ref 39.0–52.0)
Hemoglobin: 8.1 g/dL — ABNORMAL LOW (ref 13.0–17.0)
MCH: 27.5 pg (ref 26.0–34.0)
MCHC: 31.9 g/dL (ref 30.0–36.0)
MCV: 86.1 fL (ref 80.0–100.0)
Platelets: 369 10*3/uL (ref 150–400)
RBC: 2.95 MIL/uL — ABNORMAL LOW (ref 4.22–5.81)
RDW: 15.8 % — ABNORMAL HIGH (ref 11.5–15.5)
WBC: 23.6 10*3/uL — ABNORMAL HIGH (ref 4.0–10.5)
nRBC: 0.1 % (ref 0.0–0.2)

## 2019-05-22 LAB — HEPARIN LEVEL (UNFRACTIONATED)
Heparin Unfractionated: 0.93 IU/mL — ABNORMAL HIGH (ref 0.30–0.70)
Heparin Unfractionated: 0.66 IU/mL (ref 0.30–0.70)

## 2019-05-22 LAB — BASIC METABOLIC PANEL
Anion gap: 9 (ref 5–15)
BUN: 17 mg/dL (ref 6–20)
CO2: 26 mmol/L (ref 22–32)
Calcium: 9 mg/dL (ref 8.9–10.3)
Chloride: 102 mmol/L (ref 98–111)
Creatinine, Ser: 1.25 mg/dL — ABNORMAL HIGH (ref 0.61–1.24)
GFR calc Af Amer: >60 mL/min (ref >60)
GFR calc non Af Amer: >60 mL/min (ref >60)
Glucose, Bld: 128 mg/dL — ABNORMAL HIGH (ref 70–99)
Potassium: 4.2 mmol/L (ref 3.5–5.1)
Sodium: 137 mmol/L (ref 135–145)

## 2019-05-22 LAB — GLUCOSE, CAPILLARY
Glucose-Capillary: 114 mg/dL — ABNORMAL HIGH (ref 70–99)
Glucose-Capillary: 131 mg/dL — ABNORMAL HIGH (ref 70–99)
Glucose-Capillary: 230 mg/dL — ABNORMAL HIGH (ref 70–99)

## 2019-05-22 LAB — PROTIME-INR
INR: 1.3 — ABNORMAL HIGH (ref 0.8–1.2)
Prothrombin Time: 16.3 seconds — ABNORMAL HIGH (ref 11.4–15.2)

## 2019-05-22 LAB — MAGNESIUM: Magnesium: 1.9 mg/dL (ref 1.7–2.4)

## 2019-05-22 LAB — H. PYLORI ANTIBODY, IGG: H Pylori IgG: 0.25 Index Value (ref 0.00–0.79)

## 2019-05-22 LAB — HEMOGLOBIN A1C
Hgb A1c MFr Bld: 6.7 % — ABNORMAL HIGH (ref 4.8–5.6)
Mean Plasma Glucose: 145.59 mg/dL

## 2019-05-22 MED ORDER — COUMADIN BOOK
Freq: Once | Status: AC
  Administered 2019-05-22
  Filled 2019-05-22: qty 1

## 2019-05-22 MED ORDER — MAGNESIUM SULFATE IN D5W 1-5 GM/100ML-% IV SOLN
1.0000 g | Freq: Once | INTRAVENOUS | Status: AC
  Administered 2019-05-22: 1 g via INTRAVENOUS
  Filled 2019-05-22: qty 100

## 2019-05-22 MED ORDER — WARFARIN SODIUM 7.5 MG PO TABS
7.5000 mg | ORAL_TABLET | Freq: Once | ORAL | Status: AC
  Administered 2019-05-22: 7.5 mg via ORAL
  Filled 2019-05-22: qty 1

## 2019-05-22 MED ORDER — PANTOPRAZOLE SODIUM 40 MG PO TBEC
40.0000 mg | DELAYED_RELEASE_TABLET | Freq: Two times a day (BID) | ORAL | Status: DC
  Administered 2019-05-23: 40 mg via ORAL
  Filled 2019-05-22: qty 1

## 2019-05-22 NOTE — Progress Notes (Signed)
Advanced Heart Failure Rounding Note   Subjective:    Feels good. Denies CP, SOB, orthopnea or PND. On heparin, warfarin. No bleeding. Hgb stable   Says he wants to go home tomorrow. INR 1.3  Objective:   Weight Range:  Vital Signs:   Temp:  [98.6 F (37 C)-99 F (37.2 C)] 99 F (37.2 C) (06/04 1146) Pulse Rate:  [68-78] 68 (06/04 1146) Resp:  [18-20] 20 (06/04 1146) BP: (96-136)/(56-67) 96/67 (06/04 1146) SpO2:  [97 %-98 %] 97 % (06/04 1146) Last BM Date: 05/21/19(pt states he had bm yesterday 6/3 )  Weight change: Filed Weights   05/19/19 0830 05/20/19 0600 05/21/19 0516  Weight: 114.2 kg 115.9 kg 116.1 kg    Intake/Output:   Intake/Output Summary (Last 24 hours) at 05/22/2019 1548 Last data filed at 05/22/2019 1146 Gross per 24 hour  Intake 2275.04 ml  Output 2550 ml  Net -274.96 ml     Physical Exam: General:  Well appearing. No resp difficulty HEENT: normal Neck: supple. no JVD. Carotids 2+ bilat; no bruits. No lymphadenopathy or thryomegaly appreciated. Cor: PMI nondisplaced. Irregular rate & rhythm. No rubs, gallops or murmurs. Lungs: clear Abdomen: soft, nontender, nondistended. No hepatosplenomegaly. No bruits or masses. Good bowel sounds. Extremities: no cyanosis, clubbing, rash, edema Neuro: alert & orientedx3, cranial nerves grossly intact. moves all 4 extremities w/o difficulty. Affect pleasant   Telemetry: AF 60-70 + PVCs Personally reviewed    Labs: Basic Metabolic Panel: Recent Labs  Lab 05/18/19 0437 05/19/19 0355 05/20/19 0426 05/21/19 0339 05/22/19 0400  NA 140 138 138 137 137  K 3.8 3.8 3.9 3.5 4.2  CL 103 104 103 102 102  CO2 27 26 27 25 26   GLUCOSE 131* 131* 140* 134* 128*  BUN 44* 31* 24* 22* 17  CREATININE 1.10 1.16 1.29* 1.38* 1.25*  CALCIUM 8.8* 8.9 9.0 8.7* 9.0  MG 1.8 1.9 1.8 1.9 1.9  PHOS  --  2.9 3.5  --   --     Liver Function Tests: Recent Labs  Lab 05/19/19 0355 05/20/19 0426  ALBUMIN 2.5* 2.5*   No  results for input(s): LIPASE, AMYLASE in the last 168 hours. No results for input(s): AMMONIA in the last 168 hours.  CBC: Recent Labs  Lab 05/18/19 1416 05/19/19 0355 05/19/19 1903 05/20/19 0426 05/21/19 0339 05/22/19 0400  WBC 15.8* 20.3*  --  22.2* 23.8* 23.6*  NEUTROABS  --   --   --  17.2*  --   --   HGB 10.3* 8.6* 9.5* 8.4* 8.3* 8.1*  HCT 32.5* 26.9* 29.3* 26.0* 25.7* 25.4*  MCV 85.1 85.1  --  86.7 86.2 86.1  PLT 284 282  --  309 347 369    Cardiac Enzymes: No results for input(s): CKTOTAL, CKMB, CKMBINDEX, TROPONINI in the last 168 hours.  BNP: BNP (last 3 results) Recent Labs    11/29/18 0808 05/11/19 0844  BNP 1,232.9* 1,980.2*    ProBNP (last 3 results) No results for input(s): PROBNP in the last 8760 hours.    Other results:  Imaging: Dg Chest 2 View  Result Date: 05/22/2019 CLINICAL DATA:  Cough EXAM: CHEST - 2 VIEW COMPARISON:  05/15/2011 FINDINGS: Cardiac shadow remains enlarged. Postsurgical changes are noted. The lungs are well aerated bilaterally. No focal infiltrate or effusion is seen. No bony abnormality is noted. IMPRESSION: No acute abnormality seen. Stable cardiomegaly. Electronically Signed   By: Inez Catalina M.D.   On: 05/22/2019 08:26  Medications:     Scheduled Medications: . allopurinol  200 mg Oral Daily  . coumadin book   Does not apply Once  . furosemide  40 mg Oral BID  . guaiFENesin  600 mg Oral BID  . hydrocortisone   Rectal BID  . insulin aspart  0-20 Units Subcutaneous TID WC  . insulin aspart  0-5 Units Subcutaneous QHS  . isosorbide-hydrALAZINE  1 tablet Oral TID  . multivitamin with minerals  1 tablet Oral Daily  . [START ON 05/23/2019] pantoprazole  40 mg Oral BID  . pantoprazole  40 mg Intravenous Q12H  . Ensure Max Protein  11 oz Oral Daily  . sacubitril-valsartan  1 tablet Oral BID  . senna  1 tablet Oral BID  . spironolactone  12.5 mg Oral Daily  . warfarin  7.5 mg Oral ONCE-1800  . Warfarin - Pharmacist  Dosing Inpatient   Does not apply q1800    Infusions: . sodium chloride 20 mL/hr at 05/19/19 1432  . ceFEPime (MAXIPIME) IV 2 g (05/22/19 1257)  . heparin 1,350 Units/hr (05/22/19 1456)    PRN Medications: acetaminophen, albuterol, benzonatate, hydrocortisone, sodium chloride flush   Assessment/plan:   1. Acute systolic HF/cardiogenic shock with biventricular HF - Echo 05/11/19 EF 20-25% with moderately decreased RV function  - Echo 12/19 EF 50% - Due to NICM (coronaries normal in 2016). Suspect due to probable tachy-induced CM (Was in AFL in 140s on admit) - Volume status stable - Continue Entresto 24/26 - Continue Bidil 1 tab tid - Spiro 12.5 bid - Lasix 40 bid  - No b-blocker with sinus and AV node dysfunction  2. PAFL/ AFIB - suspect this may be cause of CM  - however previous ECGs also suggest possible AV dissociation with accelerated junctional outpacing his sinus - Now in AF with rate control with HR in 70s mostly  - I have asked EP to see to question whether or not he may benefit from CRTpacing to help improve his CM and allow [place] to use AA as needed (versus AVN ablation). Will follow as outpatient - AC stopped due to GI bleeding results of endoscopy noted. Now back on heparin/warfarin .  - Given LV clot we suggested full overlap until INR therapeutic but he prefers to go home tomorrow. I think this is Ok we discussed risk of stroke but I think this is balanced by risk of bleeding on dual AC in setting of ulcers  3. Acute hypoxic respiratory failure - likely due to pulmonary edema. Improved with diuresis. Also treated for PNA - extubated 5/27  4. AK on CKD 3 - baseline creatinine 1.8. creatinine stable 1.25 today  5. 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 LV apical thrombus - see AC discussion above.    8. Morbid obesity - will need weight loss   9. Hypomag - supped  10. R wrist pain - likely acute gout - resolved  with prednisone - allopurinol 200 daily started.   11. GIB - per GI and primary team. Results as above  12. NSVT  - Keep K > 4.0 Mg > 2.0 . supp K   OK for d/c tomorrow on current med. We will sign off. We will arrange f/u in HF Clinic in 3-4 weeks (he wants to f/u with his cardiologist in [place] next week)   Length of Stay: 11   Markeith Jue MD 05/22/2019, 3:48 PM  Advanced Heart Failure  Team Pager [phone] (M-F; [place])  Please contact [facility] for night-coverage after hours (4p -7a ) and weekends on [email or web address]

## 2019-05-22 NOTE — Progress Notes (Signed)
[place] for Heparin Indication: LV apical thrombus + Afib   Patient Measurements: Height: 5\' 11"  (180.3 cm) Weight: 256 lb (116.1 kg) IBW/kg (Calculated) : 75.3 Heparin Dosing Weight: 100kg  Vital Signs: Temp: 98.6 F (37 C) (06/04 0556) Temp Source: Oral (06/04 0556) BP: 136/56 (06/04 0556) Pulse Rate: 73 (06/04 0556)  Labs: Recent Labs    05/20/19 0426 05/21/19 0339 05/21/19 1137 05/22/19 0021 05/22/19 0400  HGB 8.4* 8.3*  --   --  8.1*  HCT 26.0* 25.7*  --   --  25.4*  PLT 309 347  --   --  369  LABPROT 18.3* 16.3*  --   --  16.3*  INR 1.5* 1.3*  --   --  1.3*  HEPARINUNFRC 0.66 0.97* 1.12* 0.93*  --   CREATININE 1.29* 1.38*  --   --  1.25*    Assessment: 69 yoM admitted with SOB requiring intubation. Labs reveal slightly elevated troponins and D-dimer >20. Baseline INR and aPTT normal, CBC wnl, no anticoagulation PTA. 5/24 ECHO showing small, fixed thrombus on apical wall of LV. Pharmacy consulted to dose Heparin for anticoagulation.   Heparin is running into R peripheral line and heparin level drawn from opposite side. Heparin level came back therapeutic at 0.66, on 1350 units/hr. INR remains at 1.3 today - restarted on 6/3. Oral intake documented at 75-100%. Hgb 8.1 (drifting down), plt 369. No s/sx of bleeding. No infusion issues.   Goal of Therapy:  Heparin level 0.3-0.7 units/ml  INR 2-3 Monitor platelets by anticoagulation protocol: Yes   Plan:  Warfarin 7.5 mg tonight  Continue heparin drip at 1350 units/hr Check heparin level 6 hours after rate change Will continue to monitor for any bleeding complications  Thanks for allowing pharmacy to be a part of this patient's care.  Antonietta Jewel, PharmD, [place] Clinical Pharmacist  Pager: [phone] Phone: [phone]

## 2019-05-22 NOTE — Progress Notes (Signed)
      Progress Note   Subjective  Patient feeling well, stools continue to lighten. No overt bleeding. Coumadin resumed last night, remains on heparin drip.    Objective   Vital signs in last 24 hours: Temp:  [98 F (36.7 C)-98.7 F (37.1 C)] 98.6 F (37 C) (06/04 0556) Pulse Rate:  [70-78] 73 (06/04 0556) Resp:  [18-20] 18 (06/04 0556) BP: (96-136)/(46-61) 136/56 (06/04 0556) SpO2:  [96 %-98 %] 97 % (06/04 0556) Last BM Date: 05/19/19 General:    AA male in NAD Heart:  irregular rate and rhythm Lungs: Respirations even and unlabored Abdomen:  Soft, nontender and nondistended.  Extremities:  Without edema. Neurologic:  Alert and oriented,  grossly normal neurologically. Psych:  Cooperative. Normal mood and affect.  Intake/Output from previous day: 06/03 0701 - 06/04 0700 In: 1795 [P.O.:480; IV Piggyback:1315] Out: 800 [Urine:800] Intake/Output this shift: No intake/output data recorded.  Lab Results: Recent Labs    05/20/19 0426 05/21/19 0339 05/22/19 0400  WBC 22.2* 23.8* 23.6*  HGB 8.4* 8.3* 8.1*  HCT 26.0* 25.7* 25.4*  PLT 309 347 369   BMET Recent Labs    05/20/19 0426 05/21/19 0339 05/22/19 0400  NA 138 137 137  K 3.9 3.5 4.2  CL 103 102 102  CO2 27 25 26   GLUCOSE 140* 134* 128*  BUN 24* 22* 17  CREATININE 1.29* 1.38* 1.25*  CALCIUM 9.0 8.7* 9.0   LFT Recent Labs    05/20/19 0426  ALBUMIN 2.5*   PT/INR Recent Labs    05/21/19 0339 05/22/19 0400  LABPROT 16.3* 16.3*  INR 1.3* 1.3*    Studies/Results: Dg Chest 2 View  Result Date: 05/22/2019 CLINICAL DATA:  Cough EXAM: CHEST - 2 VIEW COMPARISON:  05/15/2011 FINDINGS: Cardiac shadow remains enlarged. Postsurgical changes are noted. The lungs are well aerated bilaterally. No focal infiltrate or effusion is seen. No bony abnormality is noted. IMPRESSION: No acute abnormality seen. Stable cardiomegaly. Electronically Signed   By: Inez Catalina M.D.   On: 05/22/2019 08:26       Assessment / Plan:   58 y/o male with CHF, cardiogenic shock, atrial fibrillation, LV apical thrombus, developed GI bleeding due to gastric ulcers. EGD on 6/1 showed multiple antral ulcers with adherent clot, treated with hemostasis clips. Hgb has remained stable and BUN downtrended. Now back on Coumadin and remains on heparin drip until INR therapeutic.   Recommend: - continue IV protonix through today, then protonix 40mg  BID which he will be on for the next 6 weeks, and then once daily - await H pylori IgG, treat if positive - trend Hgb, contact us if evidence of rebleeding - he will need outpatient follow up with our office in the upcoming months to discuss possible repeat EGD to assess for mucosal healing of ulcers, although doing this will depend on his cardiac status at that time. We will coordinate outpatient follow up for him once discharged.  We will sign off at this time, please call with questions.  Sheldon Cellar, MD [facility]

## 2019-05-22 NOTE — TOC Progression Note (Addendum)
Transition of Care [facility]) - Progression Note    Patient Details  Name: KYREN KNICK MRN: [id number] Date of Birth: 01/31/1961  Transition of Care [facility]) CM/SW Contact  Royston Bake, RN Phone Number: 05/22/2019, 2:39 PM  Clinical Narrative:    Received consult for [facility] Patient has private insurance with Medicare / Medicaid Family Planning  Expected Discharge Plan: Home/Self Care Barriers to Discharge: No Barriers Identified  Expected Discharge Plan and Services Expected Discharge Plan: Home/Self Care In-house Referral: NA Discharge Planning Services: CM Consult Post Acute Care Choice: NA                   DME Arranged: N/A DME Agency: NA       HH Arranged: Patient Refused [place] Agency: NA         Social Determinants of Health (SDOH) Interventions    Readmission Risk Interventions No flowsheet data found.

## 2019-05-22 NOTE — Progress Notes (Signed)
ANTICOAGULATION CONSULT NOTE  Pharmacy Consult for Heparin Indication: LV apical thrombus + Afib  Patient Measurements: Height: 5\' 11"  (180.3 cm) Weight: 256 lb (116.1 kg) IBW/kg (Calculated) : 75.3 Heparin Dosing Weight: 100kg  Vital Signs: Temp: 98.7 F (37.1 C) (06/03 2010) Temp Source: Oral (06/03 2010) BP: 108/61 (06/03 2010) Pulse Rate: 78 (06/03 2010)  Labs: Recent Labs    05/19/19 0355  05/19/19 1903 05/20/19 0426 05/21/19 0339 05/21/19 1137 05/22/19 0021  HGB 8.6*  --  9.5* 8.4* 8.3*  --   --   HCT 26.9*  --  29.3* 26.0* 25.7*  --   --   PLT 282  --   --  309 347  --   --   LABPROT 18.5*  --   --  18.3* 16.3*  --   --   INR 1.6*  --   --  1.5* 1.3*  --   --   HEPARINUNFRC  --    < >  --  0.66 0.97* 1.12* 0.93*  CREATININE 1.16  --   --  1.29* 1.38*  --   --    < > = values in this interval not displayed.     Assessment: 2 yoM admitted with SOB requiring intubation. Labs reveal slightly elevated troponins and D-dimer >20. Baseline INR and aPTT normal, CBC wnl, no anticoagulation PTA. 5/24 ECHO showing small, fixed thrombus on apical wall of LV. Pharmacy consulted to dose Heparin for anticoagulation.   Heparin is running into peripheral line in L upper arm and heparin level drawn from opposite R hand. INR 1.3 today - last dose of warfarin on 5/30. Oral intake documented at 100%. Hgb 8.3, plt 347. No s/sx of bleeding. No infusion issues.   6/4 AM update: heparin level remains high despite rate decrease, no issues per RN.   Goal of Therapy:  Heparin level 0.3-0.7 units/ml  INR 2-3 Monitor platelets by anticoagulation protocol: Yes   Plan:  Decrease heparin drip 1350 units/hr Check heparin level 8 hours after rate change Will continue to monitor for any bleeding complications  Narda Bonds, PharmD, BCPS Clinical Pharmacist Phone: [phone]

## 2019-05-22 NOTE — Progress Notes (Signed)
PROGRESS NOTE    [name]  XFG:[id number] DOB: Apr 03, 1961 DOA: 05/11/2019 PCP: Jonathon Faster, MD   Brief Narrative:  [name] 58 y.o.malewith PMH of systolic heart failure (EF 15%), Aortic stenosis s/p replacement, ascending thoracic aortic aneurysm, pulmonary HTN, obesity, and HTN presentsto the emergency department for evaluation of shortness of breath. Symptoms have been worsening over the past 7days. He denies any fevers but has had some cough. He notes worsening swelling in the legs per EMS and reports that he has been compliant with his Lasix. Denies chest pain.EMS state that initially the patient appeared to have mild symptoms. He was placed on oxygen at home and allowed to gather several things around his room which she was able to walk and do. After walking, they report significantly worsening respiratory distress requiringnonrebreather.He noted significantly elevated blood pressures.They gave aspirin in route along with 1 nitroglycerin without significant change in symptoms.  Assessment & Plan:   Active Problems:   Acute systolic CHF (congestive heart failure) (HCC)   Cardiogenic shock (HCC)   Acute respiratory failure (HCC)   Pseudomonas pneumonia (HCC)   Complaint of melena   Acute gastric ulcer with hemorrhage   Internal and external bleeding hemorrhoids   Acute hypoxic respiratory failure requiring intubation  - due to CHF exacerbation with flash pulmonary edema and Pseudomonas pneumonia - grew Pseudomonas in sputum - Successfully extubated 5/27 - Wean oxygen; currently on RA - cefepime started 5/28 to finish out an additional 8 days of abx tx for PSA (End 05/22/19) - Repeat CXR on 6/4 - without acute abnormality   Acute systolic HF/Cardiogenic shock w/ BiV HF - Echo 05/11/19: EF 20-25%, mod decreased RVF (see report) - remains on heparin gtt; warfarin restarted (pharm dosing) - no BB d/t SA, AV nodal dysfxn - BiDil and  aldactone - lasix 40 BID PO - Cardiology feels he will eventually need pacemaker - looks like plan to follow outpatient.  Afib - per cards; will d/w EP about CRT pacing per their note (looks like plan for outpatient follow up?) - Currently on heparin gtt, warfarin has been restarted  LV apical thrombus - warfarin restarted - continue heparin  - Will discuss possible d/c on lovenox with cardiology - recommended pt call his PCP for Monday appt for INR check  GI Bleed  Melena: EGD with nonbleeding gastric ulcers with adherent clot.  S/p clipping.  Per GI, suspect stress related ulcers.  Also with hemorrhoids on anoscopy.   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 Small, fixed thrombus on the apical wall of the left ventricle.  2. The left ventricle has severely reduced systolic function, with an ejection fraction of 20-25%. The cavity size was moderately dilated. There is moderate concentric left ventricular hypertrophy. Indeterminate diastolic filling due to [name]  fusion. Left  ventricular diffuse hypokinesis.  3. The right ventricle has moderately reduced systolic function. The cavity was mildly enlarged. There is mildly increased right ventricular wall thickness. Right ventricular systolic  pressure could not be assessed.  4. Left atrial size was severely dilated.  5. Right atrial size was severely dilated.  6. Trivial pericardial effusion is present.  7. The mitral valve is myxomatous. Mild thickening of the mitral valve leaflet. No evidence of mitral valve stenosis.  8. The tricuspid valve is grossly normal.  9. [name] 49mm an Edwards bioprosthesis valve is present in the aortic position. Procedure Date: 2016.  EGD 6/1 Impression - Non-bleeding gastric ulcers with adherent clot. Clips (MR conditional) were placed. 1 clip each ulcer - 2 cliups misfired - 6 used total. - Erythematous mucosa in the prepyloric region - Erythematous mucosa in the prepyloric region of the stomach. - The examination was otherwise normal. - No specimens collected. ANOSCOPY ALSO DONE - INFLAMED GRADE 2 INTERNAL AND EXTERNAL HEMORRHOIDS THAT ARE CAUSING RECTAL BLEEDING Recommendatinos - Return patient to hospital ward for ongoing care. - Cardiac diet. - Continue present medications. - STARTED PPI INFUSION WHICH I WOULD CONTINUE X 48 -72 HOURS OK TO RESUME HEPARIN BUT NEED TO SEE HOW HE WILL DO BEFORE RESTARTING WARFARIN PRESUME THESE ARE STRESS-RELATED ULCERS, ? SOME CONTRIBUTION FROM STEROIDS BUT SUSPECT PRIMARY INSULT WAS STRESS FROM OTHER ILLNESS, HYPOTENSION, ETC TREAT HEMORRHOIDS WITH HC CREAM  Antimicrobials:  Anti-infectives (From admission, onward)   Start     Dose/Rate Route Frequency Ordered Stop   05/15/19 0930  ceFEPIme (MAXIPIME) 2 g in sodium chloride 0.9 % 100 mL IVPB     2 g 200 mL/hr over 30 Minutes Intravenous Every 8 hours 05/15/19 0919 05/22/19 2359   05/11/19 1100  azithromycin (ZITHROMAX) 500 mg in sodium chloride 0.9 % 250 mL IVPB  Status:  Discontinued     500 mg 250 mL/hr over 60 Minutes Intravenous Every 24 hours 05/11/19 1029 05/15/19 0915   05/11/19 1045  cefTRIAXone (ROCEPHIN) 2 g in sodium chloride 0.9 % 100 mL IVPB  Status:  Discontinued     2 g 200 mL/hr over  30 Minutes Intravenous Every 24 hours 05/11/19 1035 05/15/19 0915     Subjective: Asking about d/c Long discussion about warfarin and bridging with heparin vs lovenox Discussed will need to discuss with cards and make sure everyone on same page prior to dc No complaints  Objective: Vitals:   05/21/19 1330 05/21/19 2010 05/22/19 0556 05/22/19 1146  BP: (!) 96/46 108/61 (!) 136/56 96/67  Pulse: 70 78 73 68  Resp: 20 18 18 20   Temp: 98 F (36.7 C) 98.7 F (37.1 C) 98.6 F (37 C) 99 F (37.2 C)  TempSrc: Oral Oral Oral Oral  SpO2: 96% 98% 97% 97%  Weight:      Height:        Intake/Output Summary (Last 24 hours) at 05/22/2019 1359 Last data filed at 05/22/2019 1146 Gross per 24 hour  Intake 2275.04 ml  Output 2550 ml  Net -274.96 ml   Filed Weights   05/19/19 0830 05/20/19 0600 05/21/19 0516  Weight: 114.2 kg 115.9 kg 116.1 kg    Examination:  General: No acute distress. Cardiovascular: Heart sounds show Thersea [place]fred[place] regular rate, and rhythm.. Lungs: Clear to auscultation bilaterally Abdomen: Soft, nontender, nondistended  Neurological: Alert and oriented 3. Moves all extremities 4. Cranial nerves II through XII grossly intact. Skin: Warm and dry. No rashes or lesions.  Extremities: No clubbing or cyanosis. No edema. Psychiatric: Mood and affect are normal. Insight and judgment are appropriate.    Data Reviewed: I have personally reviewed following labs and imaging studies  CBC: Recent Labs  Lab 05/18/19 1416 05/19/19 0355 05/19/19 1903 05/20/19 0426 05/21/19 0339 05/22/19 0400  WBC 15.8* 20.3*  --  22.2* 23.8* 23.6*  NEUTROABS  --   --   --  17.2*  --   --   HGB 10.3* 8.6* 9.5* 8.4* 8.3* 8.1*  HCT 32.5* 26.9* 29.3* 26.0* 25.7* 25.4*  MCV 85.1 85.1  --  86.7 86.2 86.1  PLT 284 282  --  309 347 324   Basic Metabolic Panel: Recent Labs  Lab 05/18/19 0437 05/19/19 0355 05/20/19 0426 05/21/19 0339 05/22/19 0400  NA 140 138 138 137 137  K 3.8 3.8 3.9 3.5 4.2   CL 103 104 103 102 102  CO2 27 26 27 25 26   GLUCOSE 131* 131* 140* 134* 128*  BUN 44* 31* 24* 22* 17  CREATININE 1.10 1.16 1.29* 1.38* 1.25*  CALCIUM 8.8* 8.9 9.0 8.7* 9.0  MG 1.8 1.9 1.8 1.9 1.9  PHOS  --  2.9 3.5  --   --    GFR: Estimated Creatinine Clearance: 83.5 mL/min (Obie Silos) (by C-G formula based on SCr of 1.25 mg/dL (H)). Liver Function Tests: Recent Labs  Lab 05/19/19 0355 05/20/19 0426  ALBUMIN 2.5* 2.5*   No results for input(s): LIPASE, AMYLASE in the last 168 hours. No results for input(s): AMMONIA in the last 168 hours. Coagulation Profile: Recent Labs  Lab 05/18/19 0437 05/19/19 0355 05/20/19 0426 05/21/19 0339 05/22/19 0400  INR 1.3* 1.6* 1.5* 1.3* 1.3*   Cardiac Enzymes: No results for input(s): CKTOTAL, CKMB, CKMBINDEX, TROPONINI in the last 168 hours. BNP (last 3 results) No results for input(s): PROBNP in the last 8760 hours. HbA1C: Recent Labs    05/22/19 0021  HGBA1C 6.7*   CBG: Recent Labs  Lab 05/21/19 1321 05/21/19 1634 05/21/19 2059 05/22/19 0653 05/22/19 1135  GLUCAP 152* 214* 231* 114* 131*   Lipid Profile: No results for input(s): CHOL, HDL, LDLCALC, TRIG, CHOLHDL, LDLDIRECT in the last 72 hours. Thyroid Function Tests: No results for input(s): TSH, T4TOTAL, FREET4, T3FREE, THYROIDAB in the last 72 hours. Anemia Panel: No results for input(s): VITAMINB12, FOLATE, FERRITIN, TIBC, IRON, RETICCTPCT in the last 72 hours. Sepsis Labs: No results for input(s): PROCALCITON, LATICACIDVEN in the last 168 hours.  No results found for this or any previous visit (from the past 240 hour(s)).       Radiology Studies: Dg Chest 2 View  Result Date: 05/22/2019 CLINICAL DATA:  Cough EXAM: CHEST - 2 VIEW COMPARISON:  05/15/2011 FINDINGS: Cardiac shadow remains enlarged. Postsurgical changes are noted. The lungs are well aerated bilaterally. No focal infiltrate or effusion is seen. No bony abnormality is noted. IMPRESSION: No acute abnormality  seen. Stable cardiomegaly. Electronically Signed   By: Inez Catalina M.D.   On: 05/22/2019 08:26        Scheduled Meds: . allopurinol  200 mg Oral Daily  . coumadin book   Does not apply Once  . furosemide  40 mg Oral BID  . guaiFENesin  600 mg Oral BID  . hydrocortisone   Rectal BID  . insulin aspart  0-20 Units Subcutaneous TID WC  . insulin aspart  0-5 Units Subcutaneous QHS  . isosorbide-hydrALAZINE  1 tablet Oral TID  . multivitamin with minerals  1 tablet Oral Daily  .  pantoprazole  40 mg Intravenous Q12H  . predniSONE  40 mg Oral Q breakfast  . Ensure Max Protein  11 oz Oral Daily  . sacubitril-valsartan  1 tablet Oral BID  . senna  1 tablet Oral BID  . spironolactone  12.5 mg Oral Daily  . warfarin  7.5 mg Oral ONCE-1800  . Warfarin - Pharmacist Dosing Inpatient   Does not apply q1800   Continuous Infusions: . sodium chloride 20 mL/hr at 05/19/19 1432  . ceFEPime (MAXIPIME) IV 2 g (05/22/19 1257)  . heparin 1,350 Units/hr (05/22/19 0133)     LOS: 11 days    Time spent: over 48 min    Fayrene Helper, MD [facility] Pager [email or web address]  If 7PM-7AM, please contact night-coverage [email or web address] Password [email or web address] 05/22/2019, 1:59 PM

## 2019-05-23 ENCOUNTER — Encounter (HOSPITAL_COMMUNITY): Payer: Medicare Other

## 2019-05-23 LAB — CBC
HCT: 25.4 % — ABNORMAL LOW (ref 39.0–52.0)
Hemoglobin: 8.1 g/dL — ABNORMAL LOW (ref 13.0–17.0)
MCH: 27.6 pg (ref 26.0–34.0)
MCHC: 31.9 g/dL (ref 30.0–36.0)
MCV: 86.4 fL (ref 80.0–100.0)
Platelets: 399 10*3/uL (ref 150–400)
RBC: 2.94 MIL/uL — ABNORMAL LOW (ref 4.22–5.81)
RDW: 15.9 % — ABNORMAL HIGH (ref 11.5–15.5)
WBC: 24.7 10*3/uL — ABNORMAL HIGH (ref 4.0–10.5)
nRBC: 0.2 % (ref 0.0–0.2)

## 2019-05-23 LAB — BASIC METABOLIC PANEL
Anion gap: 9 (ref 5–15)
BUN: 22 mg/dL — ABNORMAL HIGH (ref 6–20)
CO2: 27 mmol/L (ref 22–32)
Calcium: 8.8 mg/dL — ABNORMAL LOW (ref 8.9–10.3)
Chloride: 101 mmol/L (ref 98–111)
Creatinine, Ser: 1.45 mg/dL — ABNORMAL HIGH (ref 0.61–1.24)
GFR calc Af Amer: >60 mL/min (ref >60)
GFR calc non Af Amer: 53 mL/min — ABNORMAL LOW (ref >60)
Glucose, Bld: 130 mg/dL — ABNORMAL HIGH (ref 70–99)
Potassium: 3.7 mmol/L (ref 3.5–5.1)
Sodium: 137 mmol/L (ref 135–145)

## 2019-05-23 LAB — MAGNESIUM: Magnesium: 2 mg/dL (ref 1.7–2.4)

## 2019-05-23 LAB — PROTIME-INR
INR: 1.3 — ABNORMAL HIGH (ref 0.8–1.2)
Prothrombin Time: 15.9 seconds — ABNORMAL HIGH (ref 11.4–15.2)

## 2019-05-23 LAB — GLUCOSE, CAPILLARY
Glucose-Capillary: 111 mg/dL — ABNORMAL HIGH (ref 70–99)
Glucose-Capillary: 119 mg/dL — ABNORMAL HIGH (ref 70–99)

## 2019-05-23 LAB — HEPARIN LEVEL (UNFRACTIONATED): Heparin Unfractionated: 0.38 IU/mL (ref 0.30–0.70)

## 2019-05-23 MED ORDER — SACUBITRIL-VALSARTAN 24-26 MG PO TABS: 1.0000 | ORAL_TABLET | Freq: Two times a day (BID) | ORAL | 0 refills | Status: DC

## 2019-05-23 MED ORDER — SPIRONOLACTONE 25 MG PO TABS: 12.5000 mg | ORAL_TABLET | Freq: Every day | ORAL | 0 refills | Status: DC

## 2019-05-23 MED ORDER — ISOSORB DINITRATE-HYDRALAZINE 20-37.5 MG PO TABS: 1.0000 | ORAL_TABLET | Freq: Three times a day (TID) | ORAL | 0 refills | Status: DC

## 2019-05-23 MED ORDER — HYDRALAZINE HCL 25 MG PO TABS: 25.0000 mg | ORAL_TABLET | Freq: Three times a day (TID) | ORAL | 0 refills | Status: DC

## 2019-05-23 MED ORDER — HYDROCORTISONE (PERIANAL) 2.5 % EX CREA: TOPICAL_CREAM | Freq: Two times a day (BID) | CUTANEOUS | 0 refills | Status: DC

## 2019-05-23 MED ORDER — HYDRALAZINE HCL 25 MG PO TABS: 25.0000 mg | ORAL_TABLET | Freq: Two times a day (BID) | ORAL | 0 refills | Status: DC

## 2019-05-23 MED ORDER — PANTOPRAZOLE SODIUM 40 MG PO TBEC: 40.0000 mg | DELAYED_RELEASE_TABLET | Freq: Two times a day (BID) | ORAL | 0 refills | Status: DC

## 2019-05-23 MED ORDER — WARFARIN SODIUM 7.5 MG PO TABS: 7.5000 mg | ORAL_TABLET | Freq: Every day | ORAL | 0 refills | Status: DC

## 2019-05-23 MED ORDER — ISOSORBIDE MONONITRATE 10 MG PO TABS: 10.0000 mg | ORAL_TABLET | Freq: Every day | ORAL | 0 refills | Status: DC

## 2019-05-23 MED ORDER — HYDROCORTISONE (PERIANAL) 2.5 % EX CREA: TOPICAL_CREAM | Freq: Two times a day (BID) | CUTANEOUS | 0 refills | Status: AC

## 2019-05-23 MED ORDER — ALLOPURINOL 100 MG PO TABS: 200.0000 mg | ORAL_TABLET | Freq: Every day | ORAL | 0 refills | Status: DC

## 2019-05-23 MED ORDER — WARFARIN SODIUM 5 MG PO TABS: 7.5000 mg | ORAL_TABLET | Freq: Every day | ORAL | 0 refills | Status: DC

## 2019-05-23 MED ORDER — SACUBITRIL-VALSARTAN 24-26 MG PO TABS: 1.0000 | ORAL_TABLET | Freq: Two times a day (BID) | ORAL | 0 refills | Status: AC

## 2019-05-23 MED ORDER — ISOSORBIDE MONONITRATE 20 MG PO TABS: 60.0000 mg | ORAL_TABLET | Freq: Every day | ORAL | 0 refills | Status: DC

## 2019-05-23 MED ORDER — WARFARIN SODIUM 10 MG PO TABS
10.0000 mg | ORAL_TABLET | Freq: Once | ORAL | Status: AC
  Administered 2019-05-23: 10 mg via ORAL
  Filled 2019-05-23: qty 1

## 2019-05-23 MED FILL — ISOSORBIDE MN ER 60 MG TAB: 60 | 30 days supply | Qty: 30 | Fill #0

## 2019-05-23 MED FILL — PANTOPRAZOLE SOD DR 40 MG T: 40 | 30 days supply | Qty: 60 | Fill #0

## 2019-05-23 MED FILL — WARFARIN SODIUM 5 MG TABLET: 5 | 45 days supply | Qty: 45 | Fill #0

## 2019-05-23 MED FILL — ENTRESTO 24 MG-26 MG TABLET: 24-26 | 30 days supply | Qty: 60 | Fill #0

## 2019-05-23 MED FILL — SPIRONOLACTONE 25 MG TABLET: 25 | 30 days supply | Qty: 15 | Fill #0

## 2019-05-23 MED FILL — ALLOPURINOL 100 MG TABLET: 100 | 30 days supply | Qty: 60 | Fill #0

## 2019-05-23 MED FILL — hydrALAZINE HCL 25 MG TABS: 25 | 30 days supply | Qty: 90 | Fill #0

## 2019-05-23 NOTE — Discharge Summary (Signed)
Physician Discharge Summary  [name] DQQ:[id number] DOB: Jun 25, 1961 DOA: 05/11/2019  PCP: [name], MD  Admit date: 05/11/2019 Discharge date: 05/23/2019  Time spent: 40 minutes  Recommendations for Outpatient Follow-up:  1. Follow up outpatient CBC/CMP 2. Follow up INR as outpatient 3. Follow up with cardiology outpatient regarding LV apical thrombus 4. Follow up with cardiology as outpatient regarding cardiac resynchronization therapy 5. No beta blocker due to SA/AV nodal dysfunction per cards 6. Follow up with gastroenterology outpatient to consider repeat endoscopy   Discharge Diagnoses:  Active Problems:   Acute systolic CHF (congestive heart failure) (HCC)   Cardiogenic shock (HCC)   Acute respiratory failure (HCC)   Pseudomonas pneumonia (HCC)   Complaint of melena   Acute gastric ulcer with hemorrhage   Internal and external bleeding hemorrhoids   Discharge Condition: stable  Diet recommendation: heart healthy  Filed Weights   05/20/19 0600 05/21/19 0516 05/23/19 0433  Weight: 115.9 kg 116.1 kg 116.5 kg    History of present illness:  [name] a 58 y.o.malewith PMH of systolic heart failure (EF 15%), Aortic stenosis s/p replacement, ascending thoracic aortic aneurysm, pulmonary HTN, obesity, and HTN presentsto the emergency department for evaluation of shortness of breath. Symptoms have been worsening over the past 7days. He denies any fevers but has had some cough. He notes worsening swelling in the legs per EMS and reports that he has been compliant with his Lasix. Denies chest pain.EMS state that initially the patient appeared to have mild symptoms. He was placed on oxygen at home and allowed to gather several things around his room which she was able to walk and do. After walking, they report significantly worsening respiratory distress requiringnonrebreather.He noted significantly elevated blood pressures.They gave  aspirin in route along with 1 nitroglycerin without significant change in symptoms.  He was admitted for acute hypoxic respiratory failure due to HF exacerbation and pseudomonas pneumonia.  He was treated with antibiotics and diuretics.  He required intubation, but has now been extubated.  He was noted to have an LV apical thrombus and started on anticoagulation, but he developed a GI bleed due to suspected stress ulcers.  These were treated by GI and his anticoagulation has been restarted.  He'll need close follow up at discharge with cardiology, his PCP, and gastroenterology.   Hospital Course:  Acute hypoxic respiratory failure requiring intubation  - due to CHF exacerbation with flash pulmonary edema and Pseudomonas pneumonia - grew Pseudomonas in sputum - Successfully extubated 5/27 - Wean oxygen; currently on RA - cefepime started 5/28 to finish out an additional 8 days of abx tx for PSA (End 05/22/19) - Repeat CXR on 6/4 - without acute abnormality   Acute systolic HF/Cardiogenic shock w/ BiV HF - Echo 05/11/19: EF 20-25%, mod decreased RVF (see report) - remains on heparin gtt; warfarin restarted (pharm dosing) - no BB d/t SA, AV nodal dysfxn - BiDil and aldactone -> transition to hydral/imdur due to cost - lasix 40 BID PO - Cardiology feels he will eventually need pacemaker - looks like plan to follow outpatient.  Afib - per cards; will d/w EP about CRT pacing per their note (looks like plan for outpatient follow up?) - warfarin  LV apical thrombus - warfarin restarted - continue heparin  - discussed with pharmacy/cardiology, given recent GI bleed, no plan for lovenox at d/c.  Instructed pt to call PCP and follow up INR check on Monday.   GI Bleed  Melena: EGD with  nonbleeding gastric ulcers with adherent clot.  S/p clipping.  Per GI, suspect stress related ulcers.  Also with hemorrhoids on anoscopy.   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 Small, fixed thrombus on the apical wall of the left ventricle. 2. The left ventricle has severely reduced systolic function, with an ejection fraction of 20-25%. The cavity size was moderately dilated. There is moderate concentric left ventricular hypertrophy. Indeterminate diastolic filling due to E-A fusion. Left ventricular diffuse hypokinesis. 3. The right ventricle has moderately reduced systolic function. The cavity was mildly enlarged. There is mildly increased right ventricular wall thickness. Right ventricular systolic pressure could not be assessed. 4. Left atrial size was severely dilated. 5. Right atrial size was severely dilated. 6. Trivial pericardial effusion is present. 7. The mitral valve is myxomatous. Mild thickening of the mitral valve leaflet. No evidence of mitral valve stenosis. 8. The tricuspid valve is grossly normal. 9. A 60mm an Edwards bioprosthesis valve is present in the aortic position. Procedure Date: 2016.  EGD 6/1 Impression - Non-bleeding gastric ulcers with adherent clot. Clips (MR conditional) were placed. 1 clip each ulcer - 2 cliups misfired - 6 used total. - Erythematous mucosa in the prepyloric region - Erythematous mucosa in the prepyloric region of the stomach. - The examination was otherwise normal. - No specimens collected. ANOSCOPY ALSO DONE - INFLAMED GRADE 2 INTERNAL AND EXTERNAL HEMORRHOIDS THAT ARE  CAUSING RECTAL BLEEDING Recommendatinos - Return patient to hospital ward for ongoing care. - Cardiac diet. - Continue present medications. - STARTED PPI INFUSION WHICH I WOULD CONTINUE X 48 -72 HOURS OK TO RESUME HEPARIN BUT NEED TO SEE HOW HE WILL DO BEFORE RESTARTING WARFARIN PRESUME THESE ARE STRESS-RELATED ULCERS, ? SOME CONTRIBUTION FROM STEROIDS BUT SUSPECT PRIMARY INSULT WAS STRESS FROM OTHER ILLNESS, HYPOTENSION, ETC TREAT HEMORRHOIDS WITH HC CREAM   Consultations: GI Cardiology PCCM  Discharge Exam: Vitals:   05/23/19 0433 05/23/19 1151  BP: (!) 111/49 116/88  Pulse: (!) 52 61  Resp: 18 18  Temp: 98.5 F (36.9 C) 97.7 F (36.5 C)  SpO2: 95% 97%   Ready for discharge Discussed recommendations for d/c plan, need for f/u on Monday No complaints.   General: No acute distress. Cardiovascular: irreg irregular Lungs: Clear to auscultation bilaterally Abdomen: Soft, nontender, nondistended  Neurological: Alert and oriented 3. Moves all extremities 4 . Cranial nerves II through XII grossly intact. Skin: Warm and dry. No rashes or lesions. Extremities: No clubbing or cyanosis. No edema. Psychiatric: Mood and affect are normal. Insight and judgment are appropraite.   Discharge Instructions   Discharge Instructions    (HEART FAILURE PATIENTS) Call MD:  Anytime you have any of the following symptoms: 1) 3 pound weight gain in 24 hours or 5 pounds in 1 week 2) shortness of breath, with or without a dry hacking cough 3) swelling in the hands, feet or stomach 4) if you have to sleep on extra pillows  at night in order to breathe.   Complete by:  As directed    Call MD for:  difficulty breathing, headache or visual disturbances   Complete by:  As directed    Call MD for:  extreme fatigue   Complete by:  As directed    Call MD for:  hives   Complete by:  As directed    Call MD for:  persistant dizziness or light-headedness   Complete by:  As directed    Call MD  for:  persistant nausea and vomiting   Complete by:  As directed    Call MD for:  redness, tenderness, or signs of infection (pain, swelling, redness, odor or green/yellow discharge around incision site)   Complete by:  As directed    Call MD for:  severe uncontrolled pain   Complete by:  As directed    Call MD for:  temperature >100.4   Complete by:  As directed    Diet - low sodium heart healthy   Complete by:  As directed    Discharge instructions   Complete by:  As directed    You were seen for heart failure and pneumonia.  You were treated with antibiotics for your pneumonia.  Your were started on new medications for your heart failure.  Your lisinopril and coreg were STOPPED.  You were started on entresto, bidil, spironolactone.  You'll continue lasix 40 mg twice daily. Continue the isosorbide mononitrate and the hydralazine, but please note the change in dose.  You have a left ventricular apical thrombus that needs to be treated with anticoagulation.  It is extremely important that you have a follow up appointment on Monday, 6/8 for an INR check.    You had a GI bleed that was related to suspected stress ulcers and hemorrhoids.  Continue the anusol for an additional 2 days.  Continue protonix twice daily for 6 weeks and then transition to once daily.  You need outpatient follow up with gastroenterology to discuss a repeat endoscopy to ensure ulcer healing.  You should follow up with your primary cardiologist to discuss pacing.  Follow up closely with your PCP, cardiologist, and gastroenterologist in follow up.  Follow up repeat labs on Monday as noted above (INR, CBC, BMP).  Return for new, recurrent, or worsening symptoms.  Please ask your PCP to request records from this hospitalization so they know what was done and what the next steps will be.   Heart Failure patients record your daily weight using the same scale at the same time of day   Complete by:  As directed     Increase activity slowly   Complete by:  As directed      Allergies as of 05/23/2019   No Known Allergies     Medication List    STOP taking these medications   aspirin EC 81 MG tablet   carvedilol 3.125 MG tablet Commonly known as:  COREG   Crestor 10 MG tablet Generic drug:  rosuvastatin   lisinopril 40 MG tablet Commonly known as:  ZESTRIL     TAKE these medications   allopurinol 100 MG tablet Commonly known as:  ZYLOPRIM Take 2 tablets (200 mg total) by mouth daily for 30 days. Notes to patient:  6/6 If agreeable   furosemide 40 MG tablet Commonly known as:  LASIX Take 40 mg by mouth 2 (two) times daily. Notes to patient:  6/5 5pm   hydrALAZINE 25 MG tablet Commonly known as:  APRESOLINE Take 1 tablet (25 mg total) by mouth 3 (three) times daily for 30 days. What changed:  when to take this   hydrocortisone 2.5 % rectal cream Commonly known as:  ANUSOL-HC Place rectally 2 (two) times daily for 2 days. (take for additional 2 days for your hemorrhoids) Notes to patient:  6/5 10pm   isosorbide mononitrate 20 MG tablet Commonly known as:  ISMO Take 3 tablets (60 mg total) by mouth daily for 30 days. What changed:    medication strength  how much to take   pantoprazole 40 MG tablet Commonly known as:  PROTONIX Take 1 tablet (40 mg total) by mouth 2 (two) times daily for 30 days. Notes to patient:  6/5 10pm   sacubitril-valsartan 24-26 MG Commonly known as:  ENTRESTO Take 1 tablet by mouth 2 (two) times daily for 30 days. Notes to patient:  6/5 10pm   spironolactone 25 MG tablet Commonly known as:  ALDACTONE Take 0.5 tablets (12.5 mg total) by mouth daily for 30 days. Notes to patient:  6/6 10am   warfarin 5 MG tablet Commonly known as:  COUMADIN Take 1.5 tablets (7.5 mg total) by mouth daily for 30 doses. Please follow up 6/8 (Monday) for an INR check with your PCP      No Known Allergies Follow-up Information    [facility]. Go on 05/23/2019.   Specialty:  Cardiology Why:  9:30 AM, [facility], parking code [place] information: [address] [id number] [place]       [name], MD Follow up.   Specialty:  Internal Medicine Contact information: [address] [place] [address] [phone]        Sueanne Margarita, MD On 06/03/2019.   Specialty:  Cardiology Why:  @2 :45pm Tele Visit Contact information: [address] [place] [address] [phone]        [facility] On 06/05/2019.   Specialty:  Gastroenterology Why:  Call for follow up appointment for your ulcers@4 :pm with Zoom Contact information: [address] [place] [address] [phone]           The results of significant diagnostics from this hospitalization (including imaging, microbiology, ancillary and laboratory) are listed below for reference.    Significant Diagnostic Studies: Dg Chest 2 View  Result Date: 05/22/2019 CLINICAL DATA:  Cough EXAM: CHEST - 2 VIEW COMPARISON:  05/15/2011 FINDINGS: Cardiac shadow remains enlarged. Postsurgical changes are noted. The lungs are well aerated bilaterally. No focal infiltrate or effusion is seen. No bony abnormality is noted. IMPRESSION: No acute abnormality seen. Stable cardiomegaly. Electronically Signed   By: Inez Catalina M.D.   On: 05/22/2019 08:26   Dg Wrist Complete Right  Result Date: 05/15/2019 CLINICAL DATA:  Right wrist pain since 0 05/11/2019. No reported injury. EXAM: RIGHT WRIST - COMPLETE 3+ VIEW COMPARISON:  None. FINDINGS: There is no evidence of fracture or dislocation. There is no evidence of arthropathy or other focal bone abnormality. Soft tissues are unremarkable. IMPRESSION: Negative exam. Electronically Signed   By: Inge Rise M.D.   On: 05/15/2019 19:33   Dg Abd 1 View  Result  Date: 05/13/2019 CLINICAL DATA:  Orogastric tube placement. EXAM: ABDOMEN - 1 VIEW COMPARISON:  Radiograph December 03, 2018. FINDINGS: The bowel gas pattern is normal. Distal tip of enteric tube is seen in proximal stomach. No radio-opaque calculi or other  significant radiographic abnormality are seen. IMPRESSION: Distal tip of enteric tube seen in proximal stomach. Electronically Signed   By: Marijo Conception M.D.   On: 05/13/2019 12:59   Dg Chest Port 1 View  Result Date: 05/15/2019 CLINICAL DATA:  Acute respiratory failure. EXAM: PORTABLE CHEST 1 VIEW COMPARISON:  05/14/2019. FINDINGS: Interim extubation and removal of NG tube. Prior cardiac valve replacement. Surgical clips over the upper chest. Stable cardiomegaly. Stable left base atelectasis/infiltrate. Small left pleural effusion cannot be excluded. No pneumothorax. IMPRESSION: 1.  Interim extubation and removal of NG tube. 2.  Prior cardiac valve replacement.  Stable cardiomegaly. 3. Stable left base atelectasis/infiltrate. Small left pleural effusion cannot be excluded. Electronically Signed   By: Marcello Moores  Register   On: 05/15/2019 07:11   Dg Chest Port 1 View  Result Date: 05/14/2019 CLINICAL DATA:  Shortness of breath. EXAM: PORTABLE CHEST 1 VIEW COMPARISON:  Chest x-ray from yesterday. FINDINGS: Unchanged endotracheal tube. Enteric tube entering the stomach with the tip below the field of view. Stable cardiomegaly status post AVR. Normal pulmonary vascularity. Unchanged left basilar atelectasis/infiltrate. Unchanged small left pleural effusion. No pneumothorax. No acute osseous abnormality. IMPRESSION: 1. Unchanged left basilar atelectasis and/or infiltrate with small left pleural effusion. Electronically Signed   By: Titus Dubin M.D.   On: 05/14/2019 09:29   Dg Chest Port 1 View  Result Date: 05/13/2019 CLINICAL DATA:  Arrest.  Intubation. EXAM: PORTABLE CHEST 1 VIEW COMPARISON:  Earlier same day FINDINGS: Endotracheal tube tip remains  6.5 cm above the carina. Nasogastric tube tip is in the mid chest level. Previous median sternotomy and aortic valve replacement. Cardiomegaly. Atelectasis in the left lower lobe. IMPRESSION: Endotracheal tube well position 6.5 cm above the carina. Nasogastric tube only in the midthoracic esophagus. Electronically Signed   By: Nelson Chimes M.D.   On: 05/13/2019 08:09   Dg Chest Port 1 View  Result Date: 05/13/2019 CLINICAL DATA:  Pulmonary edema. EXAM: PORTABLE CHEST 1 VIEW COMPARISON:  05/12/2019. FINDINGS: Endotracheal tube, NG tube in stable position. Stable cardiomegaly. Prior median sternotomy and cardiac valve replacement. Continued improvement of bibasilar atelectasis/infiltrates. Small left pleural effusion again noted. No pneumothorax. IMPRESSION: 1.  Lines and tubes stable position. 2.  Stable cardiomegaly.  Prior cardiac valve replacement. 3. Continued improvement of bibasilar atelectasis/infiltrates. Small left pleural effusion again noted. Electronically Signed   By: Marcello Moores  Register   On: 05/13/2019 06:10   Dg Chest Port 1 View  Result Date: 05/12/2019 CLINICAL DATA:  58 year old male with history of acute respiratory failure. EXAM: PORTABLE CHEST 1 VIEW COMPARISON:  Chest x-ray 05/11/2019. FINDINGS: An endotracheal tube is in place with tip 7.0 cm above the carina. A nasogastric tube is seen extending into the stomach, however, the tip of the nasogastric tube extends below the lower margin of the image. Lung volumes are normal. There continues to be some patchy areas of interstitial prominence and airspace disease most evident throughout the mid to lower lungs bilaterally (left greater than right), with significant improved aeration compared to the yesterday's examination. Small left pleural effusion. No right pleural effusion. No pneumothorax. Surgical clips project over the lateral aspect of the right apically region. Mild cardiomegaly. Upper mediastinal contours are within normal limits.  Aortic atherosclerosis. Status post median sternotomy for aortic valve replacement. IMPRESSION: 1. Support apparatus and postoperative changes, as above. 2. Improving aeration throughout the lungs bilaterally, favored to reflect resolving multilobar pneumonia. 3. Small left pleural effusion. Electronically Signed   By: Quillian Quince  Entrikin M.D.   On: 05/12/2019 05:08   Dg Chest Portable 1 View  Result Date: 05/11/2019 CLINICAL DATA:  58 year old male with history of respiratory failure. EXAM: PORTABLE CHEST 1 VIEW COMPARISON:  Chest CT 12/04/2018. FINDINGS: An endotracheal tube is in place with tip 6.3 cm above the carina. A nasogastric tube is seen extending into the stomach, however, the tip of the nasogastric tube extends below the lower margin of the image. Transcutaneous defibrillator pad projecting over the upper left hemithorax. Lung volumes are normal. Diffuse interstitial prominence and patchy airspace consolidation, most evident throughout the mid to lower lungs bilaterally, but asymmetrically distributed (right greater than left). No definite pleural effusions. Mild cardiomegaly. Upper mediastinal contours are distorted by patient positioning. Status post median sternotomy for aortic valve replacement. Aortic atherosclerosis. IMPRESSION: 1. Support apparatus and postoperative changes, as above. 2. Patchy multifocal asymmetrically distributed interstitial and airspace consolidation concerning for multilobar pneumonia. 3. Aortic atherosclerosis. Electronically Signed   By: Vinnie Langton M.D.   On: 05/11/2019 10:27   Vas [place] Lower Extremity Venous (dvt)  Result Date: 05/12/2019  Lower Venous Study Indications: Edema, and SOB.  Risk Factors: CHF, EF of 15%. Limitations: Ventilation. Comparison Study: Prior negative study from 04/19/15 is available for comparison Performing Technologist: Sharion Dove RVS  Examination Guidelines: A complete evaluation includes B-mode imaging, spectral Doppler, color  Doppler, and power Doppler as needed of all accessible portions of each vessel. Bilateral testing is considered an integral part of a complete examination. Limited examinations for reoccurring indications may be performed as noted.  +---------+---------------+---------+-----------+----------+-------+ RIGHT    CompressibilityPhasicitySpontaneityPropertiesSummary +---------+---------------+---------+-----------+----------+-------+ CFV      Full           Yes      Yes                          +---------+---------------+---------+-----------+----------+-------+ SFJ      Full                                                 +---------+---------------+---------+-----------+----------+-------+ FV Prox  Full                                                 +---------+---------------+---------+-----------+----------+-------+ FV Mid   Full                                                 +---------+---------------+---------+-----------+----------+-------+ FV DistalFull                                                 +---------+---------------+---------+-----------+----------+-------+ PFV      Full                                                 +---------+---------------+---------+-----------+----------+-------+ POP  Full           Yes      Yes                          +---------+---------------+---------+-----------+----------+-------+ PTV      Full                                                 +---------+---------------+---------+-----------+----------+-------+ PERO     Full                                                 +---------+---------------+---------+-----------+----------+-------+   +---------+---------------+---------+-----------+----------+-------+ LEFT     CompressibilityPhasicitySpontaneityPropertiesSummary +---------+---------------+---------+-----------+----------+-------+ CFV      Full           Yes      Yes                           +---------+---------------+---------+-----------+----------+-------+ SFJ      Full                                                 +---------+---------------+---------+-----------+----------+-------+ FV Prox  Full                                                 +---------+---------------+---------+-----------+----------+-------+ FV Mid   Full                                                 +---------+---------------+---------+-----------+----------+-------+ FV DistalFull                                                 +---------+---------------+---------+-----------+----------+-------+ PFV      Full                                                 +---------+---------------+---------+-----------+----------+-------+ POP      Full           Yes      Yes                          +---------+---------------+---------+-----------+----------+-------+ PTV      Full                                                 +---------+---------------+---------+-----------+----------+-------+ PERO     Full                                                 +---------+---------------+---------+-----------+----------+-------+  Summary: Right: There is no evidence of deep vein thrombosis in the lower extremity. Left: There is no evidence of deep vein thrombosis in the lower extremity.  *See table(s) above for measurements and observations. Electronically signed by Deitra Mayo MD on 05/12/2019 at 6:42:20 AM.    Final     Microbiology: No results found for this or any previous visit (from the past 240 hour(s)).   Labs: Basic Metabolic Panel: Recent Labs  Lab 05/19/19 0355 05/20/19 0426 05/21/19 0339 05/22/19 0400 05/23/19 0313  NA 138 138 137 137 137  K 3.8 3.9 3.5 4.2 3.7  CL 104 103 102 102 101  CO2 26 27 25 26 27   GLUCOSE 131* 140* 134* 128* 130*  BUN 31* 24* 22* 17 22*  CREATININE 1.16 1.29* 1.38* 1.25* 1.45*  CALCIUM 8.9 9.0 8.7* 9.0 8.8*  MG  1.9 1.8 1.9 1.9 2.0  PHOS 2.9 3.5  --   --   --    Liver Function Tests: Recent Labs  Lab 05/19/19 0355 05/20/19 0426  ALBUMIN 2.5* 2.5*   No results for input(s): LIPASE, AMYLASE in the last 168 hours. No results for input(s): AMMONIA in the last 168 hours. CBC: Recent Labs  Lab 05/19/19 0355 05/19/19 1903 05/20/19 0426 05/21/19 0339 05/22/19 0400 05/23/19 0313  WBC 20.3*  --  22.2* 23.8* 23.6* 24.7*  NEUTROABS  --   --  17.2*  --   --   --   HGB 8.6* 9.5* 8.4* 8.3* 8.1* 8.1*  HCT 26.9* 29.3* 26.0* 25.7* 25.4* 25.4*  MCV 85.1  --  86.7 86.2 86.1 86.4  PLT 282  --  309 347 369 399   Cardiac Enzymes: No results for input(s): CKTOTAL, CKMB, CKMBINDEX, TROPONINI in the last 168 hours. BNP: BNP (last 3 results) Recent Labs    11/29/18 0808 05/11/19 0844  BNP 1,232.9* 1,980.2*    ProBNP (last 3 results) No results for input(s): PROBNP in the last 8760 hours.  CBG: Recent Labs  Lab 05/22/19 0653 05/22/19 1135 05/22/19 1644 05/23/19 0625 05/23/19 1146  GLUCAP 114* 131* 230* 111* 119*       Signed:  Fayrene Helper MD.  [facility] 05/23/2019, 8:08 PM

## 2019-05-23 NOTE — Progress Notes (Signed)
ANTICOAGULATION CONSULT NOTE  Pharmacy Consult for Heparin Indication: LV apical thrombus + Afib   Patient Measurements: Height: 5\' 11"  (180.3 cm) Weight: 256 lb 14.4 oz (116.5 kg) IBW/kg (Calculated) : 75.3 Heparin Dosing Weight: 100kg  Vital Signs: Temp: 98.5 F (36.9 C) (06/05 0433) Temp Source: Oral (06/05 0433) BP: 111/49 (06/05 0433) Pulse Rate: 52 (06/05 0433)  Labs: Recent Labs    05/21/19 0339  05/22/19 0021 05/22/19 0400 05/22/19 1000 05/23/19 0313  HGB 8.3*  --   --  8.1*  --  8.1*  HCT 25.7*  --   --  25.4*  --  25.4*  PLT 347  --   --  369  --  399  LABPROT 16.3*  --   --  16.3*  --  15.9*  INR 1.3*  --   --  1.3*  --  1.3*  HEPARINUNFRC 0.97*   < > 0.93*  --  0.66 0.38  CREATININE 1.38*  --   --  1.25*  --  1.45*   < > = values in this interval not displayed.    Assessment: 7 yoM admitted with SOB requiring intubation. Labs reveal slightly elevated troponins and D-dimer >20. Baseline INR and aPTT normal, CBC wnl, no anticoagulation PTA. 5/24 ECHO showing small, fixed thrombus on apical wall of LV. Pharmacy consulted to dose Heparin for anticoagulation.   Heparin is running into R peripheral line and heparin level drawn from opposite side. Heparin level came back therapeutic at 0.38, on 1350 units/hr. INR remains at 1.3 today - restarted on 6/3. Oral intake documented at 75%. Hgb 8.1 (stable), plt 399. No s/sx of bleeding. No infusion issues.   Goal of Therapy:  Heparin level 0.3-0.7 units/ml  INR 2-3 Monitor platelets by anticoagulation protocol: Yes   Plan:  Warfarin 10 mg today prior to discharge- discharge on 7.5 mg dose with close INR follow-up for Monday    Continue heparin drip at 1350 units/hr - plan to discontinue when ready for discharge Monitor daily HL, INR, and CBC Will continue to monitor for any bleeding complications  Thanks for allowing pharmacy to be a part of this patient's care.  Antonietta Jewel, PharmD, [place] Clinical  Pharmacist  Pager: [phone] Phone: [phone]

## 2019-05-23 NOTE — Care Management Important Message (Signed)
Important Message  Patient Details  Name: [name] MRN: [id number] Date of Birth: 09-05-1961   Medicare Important Message Given:  Yes    Shelda Altes 05/23/2019, 12:29 PM

## 2019-05-29 ENCOUNTER — Telehealth: Payer: Self-pay | Admitting: *Deleted

## 2019-05-29 NOTE — Telephone Encounter (Signed)
Spoke with patient and patient consented to   Confirm consent - "In the setting of the current Covid19 crisis, you are scheduled for a (phone or video) visit with your provider on (date) at (time).  Just as we do with many in-office visits, in order for you to participate in this visit, we must obtain consent.  If you'd like, I can send this to your mychart (if signed up) or email for you to review.  Otherwise, I can obtain your verbal consent now.  All virtual visits are billed to your insurance company just like a normal visit would be.  By agreeing to a virtual visit, we'd like you to understand that the technology does not allow for your provider to perform an examination, and thus may limit your provider's ability to fully assess your condition. If your provider identifies any concerns that need to be evaluated in person, we will make arrangements to do so.  Finally, though the technology is pretty good, we cannot assure that it will always work on either your or our end, and in the setting of a video visit, we may have to convert it to a phone-only visit.  In either situation, we cannot ensure that we have a secure connection.  Are you willing to proceed?" STAFF: Did the patient verbally acknowledge consent to telehealth visit? Document YES/NO here: YES   FULL LENGTH CONSENT FOR TELE-HEALTH VISIT   I hereby voluntarily request, consent and authorize [facility] and its employed or contracted physicians, physician assistants, nurse practitioners or other licensed health care professionals (the Practitioner), to provide me with telemedicine health care services (the "Services") as deemed necessary by the treating Practitioner. I acknowledge and consent to receive the Services by the Practitioner via telemedicine. I understand that the telemedicine visit will involve communicating with the Practitioner through live audiovisual communication technology and the disclosure of certain medical information  by electronic transmission. I acknowledge that I have been given the opportunity to request an in-person assessment or other available alternative prior to the telemedicine visit and am voluntarily participating in the telemedicine visit.  I understand that I have the right to withhold or withdraw my consent to the use of telemedicine in the course of my care at any time, without affecting my right to future care or treatment, and that the Practitioner or I may terminate the telemedicine visit at any time. I understand that I have the right to inspect all information obtained and/or recorded in the course of the telemedicine visit and may receive copies of available information for a reasonable fee.  I understand that some of the potential risks of receiving the Services via telemedicine include:  [name] Delay or interruption in medical evaluation due to technological equipment failure or disruption; . Information transmitted may not be sufficient (e.g. poor resolution of images) to allow for appropriate medical decision making by the Practitioner; and/or  . In rare instances, security protocols could fail, causing a breach of personal health information.  Furthermore, I acknowledge that it is my responsibility to provide information about my medical history, conditions and care that is complete and accurate to the best of my ability. I acknowledge that Practitioner's advice, recommendations, and/or decision may be based on factors not within their control, such as incomplete or inaccurate data provided by me or distortions of diagnostic images or specimens that may result from electronic transmissions. I understand that the practice of medicine is not an exact science and that Practitioner makes no  warranties or guarantees regarding treatment outcomes. I acknowledge that I will receive a copy of this consent concurrently upon execution via email to the email address I last provided but may also request a printed  copy by calling the office of [facility].    I understand that my insurance will be billed for this visit.   I have read or had this consent read to me. . I understand the contents of this consent, which adequately explains the benefits and risks of the Services being provided via telemedicine.  . I have been provided ample opportunity to ask questions regarding this consent and the Services and have had my questions answered to my satisfaction. . I give my informed consent for the services to be provided through the use of telemedicine in my medical care  By participating in this telemedicine visit I agree to the above.

## 2019-06-03 ENCOUNTER — Encounter: Payer: Self-pay | Admitting: Physician Assistant

## 2019-06-03 ENCOUNTER — Other Ambulatory Visit: Payer: Self-pay

## 2019-06-03 ENCOUNTER — Telehealth (INDEPENDENT_AMBULATORY_CARE_PROVIDER_SITE_OTHER): Payer: Medicare Other | Admitting: Physician Assistant

## 2019-06-03 ENCOUNTER — Telehealth: Payer: Self-pay | Admitting: Pharmacist

## 2019-06-03 VITALS — Ht 71.0 in | Wt 243.0 lb

## 2019-06-03 DIAGNOSIS — N182 Chronic kidney disease, stage 2 (mild): Secondary | ICD-10-CM

## 2019-06-03 DIAGNOSIS — I10 Essential (primary) hypertension: Secondary | ICD-10-CM

## 2019-06-03 DIAGNOSIS — Z8719 Personal history of other diseases of the digestive system: Secondary | ICD-10-CM

## 2019-06-03 DIAGNOSIS — I5022 Chronic systolic (congestive) heart failure: Secondary | ICD-10-CM

## 2019-06-03 DIAGNOSIS — Z952 Presence of prosthetic heart valve: Secondary | ICD-10-CM

## 2019-06-03 DIAGNOSIS — I4892 Unspecified atrial flutter: Secondary | ICD-10-CM

## 2019-06-03 DIAGNOSIS — I240 Acute coronary thrombosis not resulting in myocardial infarction: Secondary | ICD-10-CM

## 2019-06-03 NOTE — Progress Notes (Signed)
Virtual Visit via Telephone Note   This visit type was conducted due to national recommendations for restrictions regarding the COVID-19 Pandemic (e.g. social distancing) in an effort to limit this patient's exposure and mitigate transmission in our community.  Due to his co-morbid illnesses, this patient is at least at moderate risk for complications without adequate follow up.  This format is felt to be most appropriate for this patient at this time.  The patient did not have access to video technology/had technical difficulties with video requiring transitioning to audio format only (telephone).  All issues noted in this document were discussed and addressed.  No physical exam could be performed with this format.  Please refer to the patient's chart for his  consent to telehealth for [facility].   Date:  06/03/2019   ID:  [name], DOB 1961-09-24, MRN [id number]  Patient Location: Home Provider Location: Home  PCP:  [name], MD  Cardiologist:  [name], MD   Electrophysiologist:  None  CHF:  [place]  Evaluation Performed:  Follow-Up Visit  Chief Complaint:  Post hospital follow up   History of Present Illness:    [name] is a 58 y.o. male with systolic heart failure secondary to nonischemic cardiomyopathy, aortic valve disease, ascending thoracic aortic aneurysm, atrial fibrillation/flutter, diabetes, chronic kidney disease, hypertension, pulmonary hypertension, obesity.  The patient was initially evaluated for his valve disease in 2015 but was lost to follow-up.  He was admitted to the hospital in April 2016 with decompensated heart failure and underwent bioprosthetic aortic valve replacement, replacement of the ascending aorta and R atrial Maze procedure.  He was not seen by cardiology or cardiothoracic surgery after discharge.    He was admitted in Dec 2019 with respiratory failure in the setting of decompensated CHF and pneumonia.  He had  bradycardia and junctional rhythm in the hospital.   He was last see in Jan 2020.  He was admitted 5/24-6/5 with acute hypoxic respiratory failure due to decompensated CHF and pseudomonas pneumonia.  He required intubation.  Echo demonstrated EF 20-25 with LV thrombus.  He was placed on anticoagulation but developed acute GI bleed.   EGD demonstrated non-bleeding gastric ulcers.  He underwent clipping and was placed on PPI. He was cleared by GI to resume anticoagulation and was placed on Warfarin.   He was seen by the CHF Team.  It was suspected his low EF was due to tachycardia as he was in [place] with HR in 140s on admit.  He did have evidence of possible AV dissociation with accelerated junctional outpacing his sinus.  Plan is to follow up with EP as an outpatient to determine if he would be a candidate for CRT.      He is evaluated today for post hospitalization follow up.  He is feeling good without chest pain, shortness of breath, syncope, paroxysmal nocturnal dyspnea, leg swelling.  He had not had any bleeding issues other than some hemorrhoidal bleeding.  He denies melena. He has seen his PCP but prefers to have his Coumadin managed at our office.    The patient does not have symptoms concerning for COVID-19 infection (fever, chills, cough, or new shortness of breath).    Past Medical History:  Diagnosis Date  . Aortic valve disease    a. 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 Atrial flutter ([place])   . Benign hypertensive heart and renal disease   .  Chronic combined systolic and diastolic CHF (congestive heart failure) ([place])   . Diabetes mellitus ([place])   . Essential hypertension 11/19/2014  . Insomnia   . Murmur   . Noncompliance   . Obesity   . Pulmonary hypertension ([place])    Past Surgical History:  Procedure Laterality Date  . AORTIC VALVE REPLACEMENT N/A 04/05/2015   Procedure: AORTIC VALVE REPLACEMENT (AVR)  using a 31mm Edwards Aortic Magna Ease Valve ;  Surgeon: [name], MD;  Location: [facility];  Service: Open Heart Surgery;  Laterality: N/A;  . ESOPHAGOGASTRODUODENOSCOPY (EGD) WITH PROPOFOL N/A 05/19/2019   Procedure: ESOPHAGOGASTRODUODENOSCOPY (EGD) WITH PROPOFOL;  Surgeon: [name], MD;  Location: [place];  Service: Gastroenterology;  Laterality: N/A;  . HEMOSTASIS CLIP PLACEMENT  05/19/2019   Procedure: HEMOSTASIS CLIP PLACEMENT;  Surgeon: [name], MD;  Location:  [facility] ENDOSCOPY;  Service: Gastroenterology;;  . LEFT AND RIGHT HEART CATHETERIZATION WITH CORONARY ANGIOGRAM N/A 11/27/2014   Procedure: LEFT AND RIGHT HEART CATHETERIZATION WITH CORONARY ANGIOGRAM;  Surgeon: [name], MD;  Location: [facility] CATH LAB;  Service: Cardiovascular;  Laterality: N/A;  . MAZE N/A 04/05/2015   Procedure: MAZE;  Surgeon: [name], MD;  Location: [place];  Service: Open Heart Surgery;  Laterality: N/A;  . REPLACEMENT ASCENDING AORTA N/A 04/05/2015   Procedure: REPLACEMENT ASCENDING AORTA with a 49mm Hemashield Platinum Graft;  Surgeon: [name], MD;  Location: [place];  Service: Open Heart Surgery;  Laterality: N/A;  . TEE WITHOUT CARDIOVERSION N/A 04/05/2015   Procedure: TRANSESOPHAGEAL ECHOCARDIOGRAM (TEE);  Surgeon: [name], MD;  Location: [place];  Service: Open Heart Surgery;  Laterality: N/A;     Current Meds  Medication Sig  . allopurinol (ZYLOPRIM) 100 MG tablet Take 2 tablets (200 mg total) by mouth daily for 30 days.  . furosemide (LASIX) 40 MG tablet Take 40 mg by mouth 2 (two) times daily.  . hydrALAZINE (APRESOLINE) 25 MG tablet Take 1 tablet (25 mg total) by mouth 3 (three) times daily for 30 days.  . isosorbide mononitrate (ISMO) 20 MG tablet Take 3 tablets (60 mg total) by mouth daily for 30 days.  . pantoprazole (PROTONIX) 40 MG tablet Take 1 tablet (40 mg total) by mouth 2 (two) times daily for 30 days. (Patient not taking: Reported on 06/03/2019)  .  sacubitril-valsartan (ENTRESTO) 24-26 MG Take 1 tablet by mouth 2 (two) times daily for 30 days.  [name] spironolactone (ALDACTONE) 25 MG tablet Take 0.5 tablets (12.5 mg total) by mouth daily for 30 days.  [name] warfarin (COUMADIN) 5 MG tablet Take 1.5 tablets (7.5 mg total) by mouth daily for 30 doses. Please follow up 6/8 (Monday) for an INR check with your PCP     Allergies:   Patient has no known allergies.   Social History   Tobacco Use  . Smoking status: Never Smoker  . Smokeless tobacco: Never Used  Substance Use Topics  . Alcohol use: Never    Alcohol/week: 0.0 standard drinks    Frequency: Never  . Drug use: Never     Family Hx: The patient's family history is negative for Heart attack.  ROS:   Please see the history of present illness.     All other systems reviewed and are negative.   Prior CV studies:   The following studies were reviewed today:   Echocardiogram 05/11/2019 Small fixed thrombus on the apical wall of the left ventricle  EF 20-25, moderate concentric LVH, diffuse  HK, moderately reduced RV SF, severe BAE, trivial pericardial effusion, bioprosthetic AVR okay  Echocardiogram 11/29/2018 Severe LVH, EF 50-55, mild anteroseptal hypokinesis, grade 1 diastolic dysfunction, aortic valve prosthesis functioning normally, mild AI, mean gradient 10 mmHg, severe LAE, mild RVE, moderately reduced RVSF, moderate to severe RAE, PASP 56 (moderate to severe pulmonary hypertension)  Echo 04/20/2015 Severe LVH, EF 50-55, anteroseptal hypokinesis, aortic valve bioprosthesis functioning normally, mild AI, mild LAE, moderate RAE, small to moderate pericardial effusion  Carotid US 04/01/2015 - The vertebral arteries appear patent with antegrade flow. - Findings consistent with 1-39 percent stenosis involving the right internal carotid artery and the left internal carotid artery.  Echocardiogram 03/29/2015 Moderate concentric LVH, EF 30-35, diffuse HK, possibly bicuspid  aortic valve with moderate to severe AI, severe BAE, PASP 42  Cardiac catheterization 11/27/2014 Severe nonischemic dilated cardiomyopathy with EF estimate 15%. Normal coronary arteries.  Labs/Other Tests and Data Reviewed:    EKG:  No ECG reviewed.  Recent Labs: 05/11/2019: B Natriuretic Peptide 1,980.2 05/15/2019: ALT 47 05/23/2019: BUN 22; Creatinine, Ser 1.45; Hemoglobin 8.1; Magnesium 2.0; Platelets 399; Potassium 3.7; Sodium 137   Recent Lipid Panel Lab Results  Component Value Date/Time   TRIG 146 05/14/2019 04:00 AM    Wt Readings from Last 3 Encounters:  06/03/19 243 lb (110.2 kg)  05/23/19 256 lb 14.4 oz (116.5 kg)  01/07/19 253 lb 1.9 oz (114.8 kg)     Objective:    Vital Signs:  Ht 5\' 11"  (1.803 m)   Wt 243 lb (110.2 kg)   BMI 33.89 kg/m    VITAL SIGNS:  reviewed GEN:  no acute distress RESPIRATORY:  no labored breathing NEURO:  alert and oriented PSYCH:  appropriate mood  ASSESSMENT & PLAN:    Chronic systolic CHF (congestive heart failure) ([place]) -  Plan: EF 20-25.  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  He had normal cors in 2016.  He was in AFlutter with HR 140s when he was admitted.  He is doing well with NYHA 2 symptoms and volume seems stable.  He is not on beta-blocker due to probable AV dissociation.  Continue Entresto, hydralazine, nitrates, spironolactone.  Will get records from PCP.  Will get follow up BMET in the office this week.  I discussed his case with Dr. Haroldine Laws over the phone.  I will have him see Dr. Haroldine Laws in the [facility] in 2-3 weeks.    Essential hypertension -  Plan: We will provide him with a BP cuff at our office so he can track his BP.  Atrial flutter, unspecified type ([place]) -  Plan: He is on Coumadin for anticoagulation.  He will have this managed at our office.  I will arrange an appt.  He will also need EP follow up.  I discussed with Dr. Haroldine Laws.  He notes Dr. [name] reviewed his ECGs in the hospital.  The patient will  probably need CRT in the future.  Will refer him to Dr. [name].  CKD (chronic kidney disease), stage II-III -  Plan: Arrange follow up BMET   S/P AVR -  Plan: Stable on recent Echo.  Continue SBE prophylaxis.    LV (left ventricular) mural thrombus without MI -  Plan: Continue Coumadin.  We will follow in our office.   History of GI bleed -  Plan: No further bleeding.  Continue PPI.    COVID-19 Education: The signs and symptoms of COVID-19 were discussed with the patient and how to seek care for testing (follow  up with PCP or arrange E-visit).  The importance of social distancing was discussed today.  Time:   Today, I have spent 25 minutes with the patient with telehealth technology discussing the above problems.     Medication Adjustments/Labs and Tests Ordered: Current medicines are reviewed at length with the patient today.  Concerns regarding medicines are outlined above.   Tests Ordered: No orders of the defined types were placed in this encounter.   Medication Changes: No orders of the defined types were placed in this encounter.   Follow Up:  In Person in 2 week(s) with Dr. Haroldine Laws in the CHF clinic.  Signed, Richardson Dopp, PA-C  06/03/2019 5:30 PM     [facility]

## 2019-06-03 NOTE — Patient Instructions (Addendum)
Medication Instructions:  Your physician recommends that you continue on your current medications as directed. Please refer to the Current Medication list given to you today.  If you need a refill on your cardiac medications before your next appointment, please call your pharmacy.   Lab work: NONE ORDERED  TODAY   If you have labs (blood work) drawn today and your tests are completely normal, you will receive your results only by: [name] MyChart Message (if you have MyChart) OR . A paper copy in the mail If you have any lab test that is abnormal or we need to change your treatment, we will call you to review the results.  Testing/Procedures:NONE ORDERED  TODAY  Follow-Up: Will be contacted on  recommendations on follow up   Any Other Special Instructions Will Be Listed Below (If Applicable).

## 2019-06-03 NOTE — Telephone Encounter (Signed)
New Coumadin patient referred by Richardson Dopp. Appt made for this Friday.  1. COVID-19 Pre-Screening Questions:  . In the past 7 to 10 days have you had a cough,  shortness of breath, headache, congestion, fever (100 or greater) body aches, chills, sore throat, or sudden loss of taste or sense of smell?  No . Have you been around anyone with known Covid 19.  No . Have you been around anyone who is awaiting Covid 19 test results in the past 7 to 10 days?  No . Have you been around anyone who has been exposed to Covid 19, or has mentioned symptoms of Covid 19 within the past 7 to 10 days?  No    2. Pt advised of visitor restrictions (no visitors allowed except if needed to conduct the visit). Also advised to arrive at appointment time and wear a mask.

## 2019-06-04 ENCOUNTER — Other Ambulatory Visit: Payer: Self-pay | Admitting: *Deleted

## 2019-06-04 DIAGNOSIS — I5022 Chronic systolic (congestive) heart failure: Secondary | ICD-10-CM

## 2019-06-04 DIAGNOSIS — I4892 Unspecified atrial flutter: Secondary | ICD-10-CM

## 2019-06-05 ENCOUNTER — Encounter: Payer: Self-pay | Admitting: Internal Medicine

## 2019-06-05 ENCOUNTER — Telehealth: Payer: Self-pay | Admitting: Physician Assistant

## 2019-06-05 ENCOUNTER — Ambulatory Visit (INDEPENDENT_AMBULATORY_CARE_PROVIDER_SITE_OTHER): Payer: Medicare Other | Admitting: Internal Medicine

## 2019-06-05 VITALS — Ht 71.0 in | Wt 243.0 lb

## 2019-06-05 DIAGNOSIS — K250 Acute gastric ulcer with hemorrhage: Secondary | ICD-10-CM | POA: Diagnosis not present

## 2019-06-05 DIAGNOSIS — Z7901 Long term (current) use of anticoagulants: Secondary | ICD-10-CM | POA: Diagnosis not present

## 2019-06-05 DIAGNOSIS — D62 Acute posthemorrhagic anemia: Secondary | ICD-10-CM

## 2019-06-05 DIAGNOSIS — K648 Other hemorrhoids: Secondary | ICD-10-CM | POA: Diagnosis not present

## 2019-06-05 DIAGNOSIS — K644 Residual hemorrhoidal skin tags: Secondary | ICD-10-CM

## 2019-06-05 MED ORDER — PANTOPRAZOLE SODIUM 40 MG PO TBEC: 40.0000 mg | DELAYED_RELEASE_TABLET | Freq: Every day | ORAL | 0 refills | Status: DC

## 2019-06-05 NOTE — Progress Notes (Signed)
TELEHEALTH ENCOUNTER IN SETTING OF COVID-19 PANDEMIC - REQUESTED BY PATIENT SERVICE PROVIDED BY TELEMEDECINE - TYPE: phone PATIENT LOCATION: home PATIENT HAS CONSENTED TO TELEHEALTH VISIT PROVIDER LOCATION: OFFICE REFERRING PROVIDER:n/a PARTICIPANTS OTHER THAN PATIENT: None TIME SPENT ON CALL 8 mins    [name] 58 y.o. 12/22/1960 [id number]  Assessment & Plan:   Encounter Diagnoses  Name Primary?  . Acute gastric ulcer with hemorrhage Yes  . Internal and external bleeding hemorrhoids   . Chronic anticoagulation     It sounds like he is improved overall.  Unfortunately he did not get his PPI until the other day.  He can take that once a day rather than twice a day.  Cardiology is agreed to do a CBC when he goes there tomorrow.  Once I see those results will determine next steps.  I am plus minus on follow-up of the gastric ulcers.  A colonoscopy makes sense in some respects but he has significant comorbidities as well so I am not sure the utility of that.  I am pretty confident that the bright red rectal bleeding he has been having is from hemorrhoids.   I appreciate the opportunity to care for this patient. CC: [name], MD   CBC Latest Ref Rng & Units 06/06/2019 05/23/2019 05/22/2019  WBC 3.4 - 10.8 x10E3/uL 6.8 24.7(H) 23.6(H)  Hemoglobin 13.0 - 17.7 g/dL 10.4(L) 8.1(L) 8.1(L)  Hematocrit 37.5 - 51.0 % 32.6(L) 25.4(L) 25.4(L)  Platelets 150 - 450 x10E3/uL 381 399 369   Hgb better  I am going to have him f/u in person in about 1 month and decide how to proceed.  [name], MD, [facility]   Subjective:   Chief Complaint: Follow-up of gastric ulcer and hemorrhoids  HPI 58 year old man who I met in the hospital and performed EGD on 05/19/2019 in the setting of a severe critical illness, he had some prepyloric gastric ulcers that I clipped.  He had been bleeding when anticoagulation therapy was reinitiated, he had melena and acute blood loss anemia.  He also  had inflamed internal and external hemorrhoids on anoscopic exam, he was having some bright red rectal bleeding.  Since that time he is done well though he did not get his PPI until recently because he said it was not in the bag when he went to the pharmacy and he did not realize that until our pre-clinic visit call recently.  He now has the medication.  Hemorrhoids have bled rarely.  He did use some hydrocortisone cream to treat those. No Known Allergies Current Meds  Medication Sig  . allopurinol (ZYLOPRIM) 100 MG tablet Take 2 tablets (200 mg total) by mouth daily for 30 days.  . furosemide (LASIX) 40 MG tablet Take 40 mg by mouth 2 (two) times daily.  . hydrALAZINE (APRESOLINE) 25 MG tablet Take 1 tablet (25 mg total) by mouth 3 (three) times daily for 30 days.  . isosorbide mononitrate (ISMO) 20 MG tablet Take 3 tablets (60 mg total) by mouth daily for 30 days.  . pantoprazole (PROTONIX) 40 MG tablet Take 1 tablet (40 mg total) by mouth 2 (two) times daily for 30 days.  . sacubitril-valsartan (ENTRESTO) 24-26 MG Take 1 tablet by mouth 2 (two) times daily for 30 days.  [name] spironolactone (ALDACTONE) 25 MG tablet Take 0.5 tablets (12.5 mg total) by mouth daily for 30 days.  [name] warfarin (COUMADIN) 5 MG tablet Take 1.5 tablets (7.5 mg total) by mouth daily for  30 doses. Please follow up 6/8 (Monday) for an INR check with your PCP   Past Medical History:  Diagnosis Date  . Aortic valve disease    a. 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 Atrial flutter ([place])   . Benign hypertensive heart and renal disease   . Chronic combined systolic and diastolic CHF (congestive heart failure) ([place])   . Diabetes mellitus ([place])   . Essential hypertension 11/19/2014  . Insomnia   . Murmur   . Noncompliance   . Obesity   . Pulmonary hypertension ([place])    Past Surgical History:  Procedure Laterality Date  . AORTIC VALVE REPLACEMENT N/A  04/05/2015   Procedure: AORTIC VALVE REPLACEMENT (AVR) using a 26m Edwards Aortic Magna Ease Valve ;  Surgeon: PIvin Poot MD;  [facility]  Service: Open Heart Surgery;  Laterality: N/A;  . ESOPHAGOGASTRODUODENOSCOPY (EGD) WITH PROPOFOL N/A 05/19/2019   Procedure: ESOPHAGOGASTRODUODENOSCOPY (EGD) WITH PROPOFOL;  Surgeon: GGatha Mayer MD;  [facility]  Service: Gastroenterology;  Laterality: N/A;  . HEMOSTASIS CLIP PLACEMENT  05/19/2019   Procedure: HEMOSTASIS CLIP PLACEMENT;  Surgeon: GGatha Mayer MD;  [facility];  Service: Gastroenterology;;  . LEFT AND RIGHT HEART CATHETERIZATION WITH CORONARY ANGIOGRAM N/A 11/27/2014   Procedure: LEFT AND RIGHT HEART CATHETERIZATION WITH CORONARY ANGIOGRAM;  Surgeon: TTroy Sine MD;  [facility];  Service: Cardiovascular;  Laterality: N/A;  . MAZE N/A 04/05/2015   Procedure: MAZE;  Surgeon: PIvin Poot MD;  [place] [facility]  Service: Open Heart Surgery;  Laterality: N/A;  . REPLACEMENT ASCENDING AORTA N/A 04/05/2015   Procedure: REPLACEMENT ASCENDING AORTA with a 334mHemashield Platinum Graft;  Surgeon: PeIvin PootMD;  [facility] Service: Open [facility] Surgery;  Laterality: N/A;  . TEE WITHOUT CARDIOVERSION N/A 04/05/2015   Procedure: TRANSESOPHAGEAL ECHOCARDIOGRAM (TEE);  Surgeon: PeIvin PootMD;  [facility] Service: Open Heart Surgery;  Laterality: N/A;   Social History   Social History Narrative   Disabled - truck driver   Lives alone or with girlfriend   1 son born 2006   family history is not on file.   Review of Systems As per HPI

## 2019-06-05 NOTE — Telephone Encounter (Signed)
Please make sure patient gets a CBC along with his BMET (appt 6/19 at 11:15). The order has been placed. Richardson Dopp, PA-C    06/05/2019 5:12 PM

## 2019-06-05 NOTE — Telephone Encounter (Signed)
-----   Message from [name], MD sent at 06/05/2019  4:11 PM EDT ----- Regarding: CBC please Zakari Bathe,  Can you add a CBC to this man's labs for tomorrow?  He is getting a BMET and I need to f/u CBC   Dx can be acute blood loss anemia  Madaline Brilliant

## 2019-06-06 ENCOUNTER — Other Ambulatory Visit: Payer: Medicare Other | Admitting: *Deleted

## 2019-06-06 ENCOUNTER — Telehealth: Payer: Self-pay

## 2019-06-06 ENCOUNTER — Other Ambulatory Visit: Payer: Self-pay

## 2019-06-06 ENCOUNTER — Ambulatory Visit (INDEPENDENT_AMBULATORY_CARE_PROVIDER_SITE_OTHER): Payer: Medicare Other | Admitting: *Deleted

## 2019-06-06 DIAGNOSIS — I5022 Chronic systolic (congestive) heart failure: Secondary | ICD-10-CM

## 2019-06-06 DIAGNOSIS — I4892 Unspecified atrial flutter: Secondary | ICD-10-CM | POA: Diagnosis not present

## 2019-06-06 DIAGNOSIS — Q231 Congenital insufficiency of aortic valve: Secondary | ICD-10-CM

## 2019-06-06 DIAGNOSIS — Z5181 Encounter for therapeutic drug level monitoring: Secondary | ICD-10-CM | POA: Diagnosis not present

## 2019-06-06 DIAGNOSIS — I10 Essential (primary) hypertension: Secondary | ICD-10-CM

## 2019-06-06 LAB — BASIC METABOLIC PANEL
BUN/Creatinine Ratio: 12 (ref 9–20)
BUN: 22 mg/dL (ref 6–24)
CO2: 18 mmol/L — ABNORMAL LOW (ref 20–29)
Calcium: 9.4 mg/dL (ref 8.7–10.2)
Chloride: 96 mmol/L (ref 96–106)
Creatinine, Ser: 1.91 mg/dL — ABNORMAL HIGH (ref 0.76–1.27)
GFR calc Af Amer: 44 mL/min/{1.73_m2} — ABNORMAL LOW (ref >59)
GFR calc non Af Amer: 38 mL/min/{1.73_m2} — ABNORMAL LOW (ref >59)
Glucose: 143 mg/dL — ABNORMAL HIGH (ref 65–99)
Potassium: 4.7 mmol/L (ref 3.5–5.2)
Sodium: 133 mmol/L — ABNORMAL LOW (ref 134–144)

## 2019-06-06 LAB — CBC
Hematocrit: 32.6 % — ABNORMAL LOW (ref 37.5–51.0)
Hemoglobin: 10.4 g/dL — ABNORMAL LOW (ref 13.0–17.7)
MCH: 25.7 pg — ABNORMAL LOW (ref 26.6–33.0)
MCHC: 31.9 g/dL (ref 31.5–35.7)
MCV: 81 fL (ref 79–97)
Platelets: 381 10*3/uL (ref 150–450)
RBC: 4.04 x10E6/uL — ABNORMAL LOW (ref 4.14–5.80)
RDW: 15.8 % — ABNORMAL HIGH (ref 11.6–15.4)
WBC: 6.8 10*3/uL (ref 3.4–10.8)

## 2019-06-06 LAB — POCT INR: INR: 1.6 — AB (ref 2.0–3.0)

## 2019-06-06 MED ORDER — FUROSEMIDE 40 MG PO TABS: 40.0000 mg | ORAL_TABLET | Freq: Every day | ORAL | 3 refills | Status: DC

## 2019-06-06 NOTE — Telephone Encounter (Signed)
Spoke with the patient, he expressed understanding and is coming for repeat labs on 6/26. He is going to call and schedule appointment with Dr. Haroldine Laws. His weight is 242 lbs today.

## 2019-06-06 NOTE — Patient Instructions (Addendum)
Description   Take 2 tablets today and tomorrow, then change dose to 1.5 tablets everyday except for 2 tablets on Wednesday and on Friday. Call coumadin clinic # [phone], if you have any bleeding, procedures or changes in medication.        A full discussion of the nature of anticoagulants has been carried out.  A benefit risk analysis has been presented to the patient, so that they understand the justification for choosing anticoagulation at this time. The need for frequent and regular monitoring, precise dosage adjustment and compliance is stressed.  Side effects of potential bleeding are discussed.  The patient should avoid any OTC items containing aspirin or ibuprofen, and should avoid great swings in general diet.  Avoid alcohol consumption.  Call if any signs of abnormal bleeding.

## 2019-06-06 NOTE — Telephone Encounter (Signed)
-----   Message from Liliane Shi, [place] sent at 06/06/2019  5:12 PM EDT ----- Creatinine increased some.  K+ normal.  Sodium low.  Glucose high.   Recommendations:  - Decrease Lasix to 40 mg once daily   - Repeat BMET 1 week  - Weigh daily  - If weight increases 3 lbs in 1 day / 5 lbs in 1 week or he develops leg swelling or increased shortness of breath >> Take extra Lasix 40 mg x 1 and call  - Please make sure he is getting a follow up with Dr. Haroldine Laws in the [facility] (next 2-3 weeks) Richardson Dopp, PA-C    06/06/2019 5:08 PM

## 2019-06-09 ENCOUNTER — Telehealth: Payer: Self-pay

## 2019-06-09 NOTE — Telephone Encounter (Signed)

## 2019-06-10 NOTE — Telephone Encounter (Signed)
Saw him last week Had CBC at cardiology Blood count is getting better  I want to see him in the office (in person) in about 1 month and will determine if we need to repeat any scope.

## 2019-06-10 NOTE — Patient Instructions (Signed)
We will arrange a follow-up visit for July.  Blood counts are better.  [name], MD, [facility]

## 2019-06-13 ENCOUNTER — Ambulatory Visit (INDEPENDENT_AMBULATORY_CARE_PROVIDER_SITE_OTHER): Payer: Medicare Other | Admitting: Pharmacist

## 2019-06-13 ENCOUNTER — Other Ambulatory Visit: Payer: Self-pay

## 2019-06-13 ENCOUNTER — Other Ambulatory Visit: Payer: Medicare Other | Admitting: *Deleted

## 2019-06-13 DIAGNOSIS — Q231 Congenital insufficiency of aortic valve: Secondary | ICD-10-CM | POA: Diagnosis not present

## 2019-06-13 DIAGNOSIS — I4892 Unspecified atrial flutter: Secondary | ICD-10-CM | POA: Diagnosis not present

## 2019-06-13 DIAGNOSIS — I10 Essential (primary) hypertension: Secondary | ICD-10-CM

## 2019-06-13 DIAGNOSIS — Z5181 Encounter for therapeutic drug level monitoring: Secondary | ICD-10-CM | POA: Diagnosis not present

## 2019-06-13 DIAGNOSIS — I5022 Chronic systolic (congestive) heart failure: Secondary | ICD-10-CM

## 2019-06-13 LAB — BASIC METABOLIC PANEL
BUN/Creatinine Ratio: 11 (ref 9–20)
BUN: 16 mg/dL (ref 6–24)
CO2: 20 mmol/L (ref 20–29)
Calcium: 9 mg/dL (ref 8.7–10.2)
Chloride: 103 mmol/L (ref 96–106)
Creatinine, Ser: 1.43 mg/dL — ABNORMAL HIGH (ref 0.76–1.27)
GFR calc Af Amer: 62 mL/min/{1.73_m2} (ref >59)
GFR calc non Af Amer: 54 mL/min/{1.73_m2} — ABNORMAL LOW (ref >59)
Glucose: 109 mg/dL — ABNORMAL HIGH (ref 65–99)
Potassium: 4.3 mmol/L (ref 3.5–5.2)
Sodium: 139 mmol/L (ref 134–144)

## 2019-06-13 LAB — POCT INR: INR: 1.7 — AB (ref 2.0–3.0)

## 2019-06-13 NOTE — Patient Instructions (Signed)
Description   Take an extra 1/2 tablet today, take 2 tablets tomorrow, then start taking 1.5 tablets every day except for 2 tablets on monday and on Friday. Recheck INR in 1 week. Call coumadin clinic # [phone], if you have any bleeding, procedures or changes in medication.

## 2019-06-17 ENCOUNTER — Telehealth: Payer: Self-pay | Admitting: *Deleted

## 2019-06-17 NOTE — Telephone Encounter (Signed)
Spoke with patient and patient will pick up on  7-2 Thursday at [facility]

## 2019-06-19 ENCOUNTER — Telehealth: Payer: Self-pay

## 2019-06-19 ENCOUNTER — Ambulatory Visit (INDEPENDENT_AMBULATORY_CARE_PROVIDER_SITE_OTHER): Payer: Medicare Other | Admitting: *Deleted

## 2019-06-19 ENCOUNTER — Other Ambulatory Visit: Payer: Self-pay

## 2019-06-19 DIAGNOSIS — I4892 Unspecified atrial flutter: Secondary | ICD-10-CM | POA: Diagnosis not present

## 2019-06-19 DIAGNOSIS — Q231 Congenital insufficiency of aortic valve: Secondary | ICD-10-CM

## 2019-06-19 DIAGNOSIS — Z5181 Encounter for therapeutic drug level monitoring: Secondary | ICD-10-CM

## 2019-06-19 DIAGNOSIS — I240 Acute coronary thrombosis not resulting in myocardial infarction: Secondary | ICD-10-CM

## 2019-06-19 LAB — POCT INR: INR: 2.3 (ref 2.0–3.0)

## 2019-06-19 NOTE — Patient Instructions (Signed)
Description   Continue taking 1.5 tablets every day except for 2 tablets on monday and on Friday. Recheck INR in 1 week. Call coumadin clinic # [phone], if you have any bleeding, procedures or changes in medication.

## 2019-06-19 NOTE — Telephone Encounter (Signed)

## 2019-06-23 ENCOUNTER — Telehealth: Payer: Self-pay

## 2019-06-23 ENCOUNTER — Telehealth: Payer: Self-pay | Admitting: *Deleted

## 2019-06-23 NOTE — Telephone Encounter (Signed)
Patient left a msg on the refill vm requesting a call back about a new medication that he was started on. He needs for it to be refilled but also states that the medication cost $600/bottle. Call back number provided was [phone]. Not sure of which med he was referring to is sounded like he was saying crestor, but I believe it to be entresto. Thanks, MI

## 2019-06-23 NOTE — Telephone Encounter (Signed)
The patient does not have anymore Entresto, he said today he took his old medication doses since he did not have Entresto. The medication is too expensive for him to take. He took 40 mg of Lisinopril and 3.125 mg carvedilol which were his old prescriptions before. He stated he is a little lightheaded today. I advised hydration. He did not have a way to check his blood pressure.

## 2019-06-23 NOTE — Telephone Encounter (Signed)
Will have patient sign paperwork for patient assistance at his office visit this week.

## 2019-06-23 NOTE — Telephone Encounter (Signed)
Spoke with pt regarding appt on 06/25/19. Pt stated he would like a phone call rather than a video visit. Pt was advise to check vitals prior to appt. Pt questions and concerns were address.

## 2019-06-24 ENCOUNTER — Encounter (HOSPITAL_COMMUNITY): Payer: Medicare Other | Admitting: Internal Medicine

## 2019-06-24 NOTE — Telephone Encounter (Signed)
Pt was supposed to have an appt w/Dr Bensimhon this morning however he "no showed" for that appt.  Per pt's chart he has Medicare A & B and Medicaid family planning so not sure what insurance pays for prescriptions.  Pt is supposed to be on Entresto 24/26 mg BID per chart, med was ordered for only 30 days when entered into epic so that is why it was automatically taken off med list after those 30 days.  I have attempted to call pt and Left message to call back, have also been trying to call pharmacy however have not gotten an answer.

## 2019-06-24 NOTE — Telephone Encounter (Signed)
[name] is not listed in the pts med list. Per Telemedicine visit with Richardson Dopp, PA-c on 06/03/2019 the pt sees Dr Haroldine Laws for his CHF. Will forward this note to that office with a request to please call the pt to discuss.

## 2019-06-25 ENCOUNTER — Other Ambulatory Visit: Payer: Self-pay

## 2019-06-25 ENCOUNTER — Telehealth: Payer: Medicare Other | Admitting: Internal Medicine

## 2019-06-26 ENCOUNTER — Ambulatory Visit (INDEPENDENT_AMBULATORY_CARE_PROVIDER_SITE_OTHER): Payer: Medicare Other | Admitting: *Deleted

## 2019-06-26 ENCOUNTER — Other Ambulatory Visit: Payer: Self-pay

## 2019-06-26 ENCOUNTER — Telehealth: Payer: Self-pay

## 2019-06-26 ENCOUNTER — Telehealth: Payer: Self-pay | Admitting: *Deleted

## 2019-06-26 DIAGNOSIS — Z5181 Encounter for therapeutic drug level monitoring: Secondary | ICD-10-CM

## 2019-06-26 DIAGNOSIS — I4892 Unspecified atrial flutter: Secondary | ICD-10-CM

## 2019-06-26 LAB — POCT INR: INR: 2.1 (ref 2.0–3.0)

## 2019-06-26 NOTE — Telephone Encounter (Signed)

## 2019-06-26 NOTE — Patient Instructions (Signed)
Description   Continue taking 1.5 tablets every day except for 2 tablets on monday and on Friday. Recheck INR in 1 week. Call coumadin clinic # [phone], if you have any bleeding, procedures or changes in medication.

## 2019-06-26 NOTE — Telephone Encounter (Addendum)
Pt arrived at 1015am for a 945am appt for Anticoagulation Clinic. Pt is aware that we are unable to just allow him to be seen when he wants to show up for his appt. Pt was educated on this at his last visit as pt was late at that time, too, and he was educated about arriving on time for appts. Pt was reminded again and pt aware he will have to wait until 11am, our next available slot to be seen and he is agreeable. Also, pt is aware that next time if he is more than 15 minutes late, he will need to reschedule appt.  Spoke with pt and he is aware that we may not be able to work him in the future as this can run into others appt times. Pt states he needed to help someone advised to make appts later since he seems to arrive later. Pt is being seen at 10:27am today.

## 2019-07-02 NOTE — Telephone Encounter (Signed)
Patient returned call to report his co pay for entresto was over $72 Advised he may require PA for medication, I would have to look into this further with his insurance (MEDICARE A/B and Family [place])  Patient reports since he did not have entresto he restarted lisinopril and coreg Advised these medications were d/c'd at discharge and should NOT continue  Samples provided Medication Samples have been provided to the patient.  Drug name: entresto       Strength: 24/26        Qty: 28  LOT: [id number]  Exp.Date: 02/2021  Dosing instructions: one tab by mouth daily  The patient has been instructed regarding the correct time, dose, and frequency of taking this medication, including desired effects and most common side effects.   [name] 12:19 PM 07/02/2019  Patient assistance application left with samples. Will forward to SW to further discuss insurance    Schedulers to assist with rescheduling f/u appt

## 2019-07-03 ENCOUNTER — Ambulatory Visit (INDEPENDENT_AMBULATORY_CARE_PROVIDER_SITE_OTHER): Payer: Medicare Other | Admitting: *Deleted

## 2019-07-03 ENCOUNTER — Other Ambulatory Visit (HOSPITAL_COMMUNITY): Payer: Self-pay

## 2019-07-03 ENCOUNTER — Other Ambulatory Visit: Payer: Self-pay

## 2019-07-03 ENCOUNTER — Telehealth (HOSPITAL_COMMUNITY): Payer: Self-pay | Admitting: Licensed Clinical Social Worker

## 2019-07-03 DIAGNOSIS — I4892 Unspecified atrial flutter: Secondary | ICD-10-CM | POA: Diagnosis not present

## 2019-07-03 DIAGNOSIS — Z5181 Encounter for therapeutic drug level monitoring: Secondary | ICD-10-CM

## 2019-07-03 LAB — POCT INR: INR: 1.9 — AB (ref 2.0–3.0)

## 2019-07-03 MED ORDER — ENTRESTO 24-26 MG PO TABS: 1.0000 | ORAL_TABLET | Freq: Two times a day (BID) | ORAL | 3 refills | Status: DC

## 2019-07-03 NOTE — Telephone Encounter (Signed)
entresto script printed to accompany patient assistance application

## 2019-07-03 NOTE — Patient Instructions (Addendum)
Description   Take an extra 1/2 tablet today, then continue taking 1.5 tablets every day except for 2 tablets on Monday and on Friday. Recheck INR in 1 week. Call coumadin clinic # [phone], if you have any bleeding, procedures or changes in medication. HF # [id number],  [facility] main # [phone]

## 2019-07-03 NOTE — Telephone Encounter (Signed)
CSW consulted to help with [facility] assistance.  CSW helped pt complete application and left for pharmacy tech to follow up by completing physician portion and getting signature.  CSW will continue to follow and assist as needed  Jorge Ny, [facility] Desk#: [phone] Cell#: [phone]

## 2019-07-03 NOTE — Telephone Encounter (Signed)
Opened in error

## 2019-07-04 ENCOUNTER — Telehealth (HOSPITAL_COMMUNITY): Payer: Self-pay | Admitting: Pharmacy Technician

## 2019-07-04 NOTE — Telephone Encounter (Signed)
Oral Oncology Patient Advocate Encounter  Patient completed an application for [employer] ([employer]) in an effort to reduce the patient's out of pocket expense for Entresto to $0.    Application completed and faxed to [phone].   [employer] phone number for follow up is [phone].   This encounter will be updated until final determination.   Charlann Boxer, CPhT

## 2019-07-07 ENCOUNTER — Telehealth: Payer: Self-pay

## 2019-07-07 NOTE — Telephone Encounter (Signed)
Left patient detailed message with appointment date/time. Told him I will call him back on Wednesday to COVID screen him.

## 2019-07-07 NOTE — Telephone Encounter (Signed)
-----   Message from Brissa Asante E [place], [place] sent at 06/10/2019  2:17 PM EDT ----- Call him 07/07/2019 to remind him of his appointment 07/10/2019 at 10:30AM per patient request. Make sure he known our location.

## 2019-07-09 ENCOUNTER — Telehealth: Payer: Self-pay

## 2019-07-09 NOTE — Telephone Encounter (Signed)
Covid-19 screening questions   Do you now or have you had a fever in the last 14 days? No  Do you have any respiratory symptoms of shortness of breath or cough now or in the last 14 days? No  Do you have any family members or close contacts with diagnosed or suspected Covid-19 in the past 14 days? No  Have you been tested for Covid-19 and found to be positive? Was tested in February and found to be negative

## 2019-07-10 ENCOUNTER — Ambulatory Visit (INDEPENDENT_AMBULATORY_CARE_PROVIDER_SITE_OTHER): Payer: Medicare Other | Admitting: Internal Medicine

## 2019-07-10 ENCOUNTER — Other Ambulatory Visit (INDEPENDENT_AMBULATORY_CARE_PROVIDER_SITE_OTHER): Payer: Medicare Other

## 2019-07-10 ENCOUNTER — Other Ambulatory Visit: Payer: Self-pay | Admitting: Internal Medicine

## 2019-07-10 ENCOUNTER — Encounter: Payer: Self-pay | Admitting: Internal Medicine

## 2019-07-10 VITALS — BP 110/64 | HR 76 | Temp 99.0°F | Ht 69.5 in | Wt 264.5 lb

## 2019-07-10 DIAGNOSIS — J9601 Acute respiratory failure with hypoxia: Secondary | ICD-10-CM | POA: Diagnosis not present

## 2019-07-10 DIAGNOSIS — D62 Acute posthemorrhagic anemia: Secondary | ICD-10-CM

## 2019-07-10 DIAGNOSIS — K250 Acute gastric ulcer with hemorrhage: Secondary | ICD-10-CM | POA: Diagnosis not present

## 2019-07-10 DIAGNOSIS — Z1211 Encounter for screening for malignant neoplasm of colon: Secondary | ICD-10-CM

## 2019-07-10 DIAGNOSIS — K649 Unspecified hemorrhoids: Secondary | ICD-10-CM

## 2019-07-10 LAB — CBC
HCT: 31.5 % — ABNORMAL LOW (ref 39.0–52.0)
Hemoglobin: 9.8 g/dL — ABNORMAL LOW (ref 13.0–17.0)
MCHC: 31.1 g/dL (ref 30.0–36.0)
MCV: 72.9 fl — ABNORMAL LOW (ref 78.0–100.0)
Platelets: 323 10*3/uL (ref 150.0–400.0)
RBC: 4.32 Mil/uL (ref 4.22–5.81)
RDW: 19 % — ABNORMAL HIGH (ref 11.5–15.5)
WBC: 7.5 10*3/uL (ref 4.0–10.5)

## 2019-07-10 LAB — FERRITIN: Ferritin: 14.8 ng/mL — ABNORMAL LOW (ref 22.0–322.0)

## 2019-07-10 MED ORDER — PANTOPRAZOLE SODIUM 40 MG PO TBEC: 40.0000 mg | DELAYED_RELEASE_TABLET | Freq: Every day | ORAL | 3 refills | Status: DC

## 2019-07-10 NOTE — Progress Notes (Signed)
[name] 58 y.o. 07/24/1961 [id number]  Assessment & Plan:   Encounter Diagnoses  Name Primary?  . Acute gastric ulcer with hemorrhage Yes  . Acute blood loss anemia   . Acute respiratory failure with hypoxia ([place])   . Colon cancer screening   . Bleeding hemorrhoids      He is improved.  I rechecked his hemoglobin and a ferritin  CBC Latest Ref Rng & Units 07/10/2019 06/06/2019 05/23/2019  WBC 4.0 - 10.5 K/uL 7.5 6.8 24.7(H)  Hemoglobin 13.0 - 17.0 g/dL 9.8(L) 10.4(L) 8.1(L)  Hematocrit 39.0 - 52.0 % 31.5(L) 32.6(L) 25.4(L)  Platelets 150.0 - 400.0 K/uL 323.0 381 399   Lab Results  Component Value Date   FERRITIN 14.8 (L) 07/10/2019   He needs iron supplementation and we will arrange that oral versus parenteral though we will try oral.   I think a screening colonoscopy would be reasonable in this man but I think he needs to be more tuned up by an improved and I would wait till his hemoglobin was better may be normal.  We will call him and ask him to take ferrous sulfate daily and have him recheck labs in 2 months.  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  I am not inclined to recheck his gastric ulcers with an endoscopy barring clinical signs of problems again.  We have decided to check IgG SARS 2 coronavirus antibodies.  I would expect it would be negative but I think it would be useful information to him to have his he is convinced he had it.   I appreciate the opportunity to care for this patient. CC: [name], MD Dr. Haroldine Laws Subjective:   Chief Complaint: Follow-up of gastric ulcers  HPI The patient is here for follow-up, I had met him in the hospital in early June when he was critically ill and he had GI bleeding when they reinstituted his warfarin.  He had 4 gastric ulcers with stigmata of  hemorrhage and these were clipped.  His anticoagulation was held and he was started on PPI.  He did well and was seen in follow-up once and now being seen again in person.  His hemoglobin has improved right had but not been rechecked since early to mid June.  He is not taking iron supplements but he has been taking his pantoprazole daily actually twice daily.  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  He also had bleeding hemorrhoids identified at a anoscopy and those have stopped bleeding. No Known Allergies Current Meds  Medication Sig  . allopurinol (ZYLOPRIM) 100 MG tablet Take 2 tablets (200 mg total) by mouth daily for 30 days.  . AMBULATORY NON FORMULARY MEDICATION Herbal Laxative tabs 1 tab by mouth twice a day  . furosemide (LASIX) 40 MG tablet Take 1 tablet (40 mg total) by mouth daily. Take an extra 40 mg of lasix if your weight increase 3 lbs in 1 day or 5 lbs in a week or SOB  . hydrALAZINE (APRESOLINE) 25 MG tablet Take 1 tablet (25 mg total) by mouth 3 (three) times daily for 30 days.  . isosorbide mononitrate (ISMO) 20  MG tablet Take 3 tablets (60 mg total) by mouth daily for 30 days.  . pantoprazole (PROTONIX) 40 MG tablet Take 1 tablet (40 mg total) by mouth daily.  . sacubitril-valsartan (ENTRESTO) 24-26 MG Take 1 tablet by mouth 2 (two) times daily.  [name] spironolactone (ALDACTONE) 25 MG tablet Take 0.5 tablets (12.5 mg total) by mouth daily for 30 days.  [name] warfarin (COUMADIN) 5 MG tablet Take 1.5 tablets (7.5 mg total) by mouth daily for 30 doses. Please follow up 6/8 (Monday) for an INR check with your PCP  . [DISCONTINUED] pantoprazole (PROTONIX) 40 MG tablet Take 1 tablet (40 mg total) by mouth daily for 30 days.  . [DISCONTINUED] pantoprazole (PROTONIX) 40 MG tablet Take 1 tablet (40 mg total) by mouth daily.   Past Medical History:   Diagnosis Date  . Aortic valve disease    a. 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 Atrial flutter ([place])   . Benign hypertensive heart and renal disease   . Chronic combined systolic and diastolic CHF (congestive heart failure) ([place])   . Diabetes mellitus ([place])   . Essential hypertension 11/19/2014  . Insomnia   . Murmur   . Noncompliance   . Obesity   . Pulmonary hypertension ([place])    Past Surgical History:  Procedure Laterality Date  . AORTIC VALVE REPLACEMENT N/A 04/05/2015   Procedure: AORTIC VALVE REPLACEMENT (AVR) using a 16m Edwards Aortic Magna Ease Valve ;  Surgeon: PIvin Poot MD;  [place] [facility]  Service: Open Heart Surgery;  Laterality: N/A;  . ESOPHAGOGASTRODUODENOSCOPY (EGD) WITH PROPOFOL N/A 05/19/2019   Procedure: ESOPHAGOGASTRODUODENOSCOPY (EGD) WITH PROPOFOL;  Surgeon: GGatha Mayer MD;  [facility]  Service: Gastroenterology;  Laterality: N/A;  . HEMOSTASIS CLIP PLACEMENT  05/19/2019   Procedure: HEMOSTASIS CLIP PLACEMENT;  Surgeon: GGatha Mayer MD;  [facility]  Service: Gastroenterology;;  . LEFT AND RIGHT HEART CATHETERIZATION WITH CORONARY ANGIOGRAM N/A 11/27/2014   Procedure: LEFT AND RIGHT HEART CATHETERIZATION WITH CORONARY ANGIOGRAM;  Surgeon: TTroy Sine MD;  [facility];  Service: Cardiovascular;  Laterality: N/A;  . MAZE N/A 04/05/2015   Procedure: MAZE;  Surgeon: PIvin Poot MD;  [place] [facility]  Service: Open Heart Surgery;  Laterality: N/A;  . REPLACEMENT ASCENDING AORTA N/A 04/05/2015   Procedure: REPLACEMENT ASCENDING AORTA with a 31mHemashield Platinum Graft;  Surgeon: PeIvin PootMD;  [facility] Service: Open Heart Surgery;  Laterality: N/A;  . 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

## 2019-07-10 NOTE — Patient Instructions (Signed)
  We have sent the following medications to your pharmacy for you to pick up at your convenience: Pantoprazole   Your provider has requested that you go to the basement level for lab work before leaving today. Press "B" on the elevator. The lab is located at the first door on the left as you exit the elevator.  We will call you with results and plans.    I appreciate the opportunity to care for you. Silvano Rusk, MD, [facility]

## 2019-07-10 NOTE — Progress Notes (Signed)
Tell the patient iron is low and needs supplement with Ferrous sulfate 324 mg daily Ask him to come in first week of October for a repeat CBC and ferritin - he should mark down in phone ( I already entered orders) Also place a colonoscopy recall for October 2020

## 2019-07-11 ENCOUNTER — Ambulatory Visit (HOSPITAL_COMMUNITY)
Admission: RE | Admit: 2019-07-11 | Discharge: 2019-07-11 | Disposition: A | Discharge status: Home / Self Care | Payer: Medicare Other | Source: Ambulatory Visit | Attending: Internal Medicine | Admitting: Internal Medicine

## 2019-07-11 ENCOUNTER — Telehealth (HOSPITAL_COMMUNITY): Payer: Self-pay | Admitting: Pharmacy Technician

## 2019-07-11 ENCOUNTER — Telehealth: Payer: Self-pay

## 2019-07-11 ENCOUNTER — Other Ambulatory Visit (HOSPITAL_COMMUNITY): Payer: Self-pay

## 2019-07-11 ENCOUNTER — Encounter (HOSPITAL_COMMUNITY): Payer: Self-pay | Admitting: Internal Medicine

## 2019-07-11 ENCOUNTER — Other Ambulatory Visit: Payer: Self-pay

## 2019-07-11 VITALS — BP 127/69 | HR 67 | Wt 266.0 lb

## 2019-07-11 DIAGNOSIS — E1122 Type 2 diabetes mellitus with diabetic chronic kidney disease: Secondary | ICD-10-CM | POA: Diagnosis not present

## 2019-07-11 DIAGNOSIS — Q231 Congenital insufficiency of aortic valve: Secondary | ICD-10-CM

## 2019-07-11 DIAGNOSIS — N183 Chronic kidney disease, stage 3 (moderate): Secondary | ICD-10-CM | POA: Diagnosis not present

## 2019-07-11 DIAGNOSIS — N182 Chronic kidney disease, stage 2 (mild): Secondary | ICD-10-CM | POA: Diagnosis not present

## 2019-07-11 DIAGNOSIS — G4700 Insomnia, unspecified: Secondary | ICD-10-CM | POA: Diagnosis not present

## 2019-07-11 DIAGNOSIS — I4891 Unspecified atrial fibrillation: Secondary | ICD-10-CM | POA: Diagnosis not present

## 2019-07-11 DIAGNOSIS — I272 Pulmonary hypertension, unspecified: Secondary | ICD-10-CM | POA: Diagnosis not present

## 2019-07-11 DIAGNOSIS — Z86718 Personal history of other venous thrombosis and embolism: Secondary | ICD-10-CM | POA: Insufficient documentation

## 2019-07-11 DIAGNOSIS — I5082 Biventricular heart failure: Secondary | ICD-10-CM | POA: Diagnosis not present

## 2019-07-11 DIAGNOSIS — M109 Gout, unspecified: Secondary | ICD-10-CM | POA: Insufficient documentation

## 2019-07-11 DIAGNOSIS — Z7901 Long term (current) use of anticoagulants: Secondary | ICD-10-CM | POA: Insufficient documentation

## 2019-07-11 DIAGNOSIS — I4892 Unspecified atrial flutter: Secondary | ICD-10-CM | POA: Insufficient documentation

## 2019-07-11 DIAGNOSIS — I480 Paroxysmal atrial fibrillation: Secondary | ICD-10-CM | POA: Diagnosis not present

## 2019-07-11 DIAGNOSIS — I5022 Chronic systolic (congestive) heart failure: Secondary | ICD-10-CM

## 2019-07-11 DIAGNOSIS — Z953 Presence of xenogenic heart valve: Secondary | ICD-10-CM | POA: Insufficient documentation

## 2019-07-11 DIAGNOSIS — I428 Other cardiomyopathies: Secondary | ICD-10-CM | POA: Insufficient documentation

## 2019-07-11 DIAGNOSIS — I4510 Unspecified right bundle-branch block: Secondary | ICD-10-CM | POA: Diagnosis not present

## 2019-07-11 DIAGNOSIS — Z8711 Personal history of peptic ulcer disease: Secondary | ICD-10-CM | POA: Insufficient documentation

## 2019-07-11 DIAGNOSIS — I5021 Acute systolic (congestive) heart failure: Secondary | ICD-10-CM | POA: Diagnosis not present

## 2019-07-11 DIAGNOSIS — Z79899 Other long term (current) drug therapy: Secondary | ICD-10-CM | POA: Diagnosis not present

## 2019-07-11 DIAGNOSIS — I513 Intracardiac thrombosis, not elsewhere classified: Secondary | ICD-10-CM | POA: Diagnosis not present

## 2019-07-11 DIAGNOSIS — I130 Hypertensive heart and chronic kidney disease with heart failure and stage 1 through stage 4 chronic kidney disease, or unspecified chronic kidney disease: Secondary | ICD-10-CM | POA: Diagnosis not present

## 2019-07-11 DIAGNOSIS — I240 Acute coronary thrombosis not resulting in myocardial infarction: Secondary | ICD-10-CM

## 2019-07-11 LAB — BASIC METABOLIC PANEL
Anion gap: 8 (ref 5–15)
BUN: 10 mg/dL (ref 6–20)
CO2: 21 mmol/L — ABNORMAL LOW (ref 22–32)
Calcium: 9.2 mg/dL (ref 8.9–10.3)
Chloride: 108 mmol/L (ref 98–111)
Creatinine, Ser: 1.18 mg/dL (ref 0.61–1.24)
GFR calc Af Amer: >60 mL/min (ref >60)
GFR calc non Af Amer: >60 mL/min (ref >60)
Glucose, Bld: 119 mg/dL — ABNORMAL HIGH (ref 70–99)
Potassium: 4.1 mmol/L (ref 3.5–5.1)
Sodium: 137 mmol/L (ref 135–145)

## 2019-07-11 LAB — SAR COV2 SEROLOGY (COVID19)AB(IGG),IA: SARS CoV2 AB IGG: NEGATIVE

## 2019-07-11 LAB — BRAIN NATRIURETIC PEPTIDE: B Natriuretic Peptide: 789.9 pg/mL — ABNORMAL HIGH (ref 0.0–100.0)

## 2019-07-11 MED ORDER — SACUBITRIL-VALSARTAN 49-51 MG PO TABS: 1.0000 | ORAL_TABLET | Freq: Two times a day (BID) | ORAL | 3 refills | Status: DC

## 2019-07-11 NOTE — Progress Notes (Signed)
Please let him know that this test tells [place] he did not and has not had Covid-19

## 2019-07-11 NOTE — Patient Instructions (Addendum)
INCREASE Entresto 49/51mg  (1 tab) twice day  You were given samples of Entrsto 49/51mg  (1 bottle).  Once approved, your medication will be shipped to you from [employer].  Labs today We will only contact you if something comes back abnormal or we need to make some changes. Otherwise no news is good news!  Your physician has requested that you have an echocardiogram. Echocardiography is a painless test that uses sound waves to create images of your heart. It provides your doctor with information about the size and shape of your heart and how well your heart's chambers and valves are working. This procedure takes approximately one hour. There are no restrictions for this procedure.  Your physician recommends that you schedule a follow-up appointment in: You will get a phone call to schedule your follow up and echo.  At the [facility], you and your health needs are our priority. As part of our continuing mission to provide you with exceptional heart care, we have created designated Provider Care Teams. These Care Teams include your primary Cardiologist (physician) and Advanced Practice Providers (APPs- Physician Assistants and Nurse Practitioners) who all work together to provide you with the care you need, when you need it.   You may see any of the following providers on your designated Care Team at your next follow up: [name] Dr Glori Bickers . Dr Loralie Champagne . Darrick Grinder, NP

## 2019-07-11 NOTE — Progress Notes (Signed)
GIVEN BY PHARMACY TECH ELIZATBETH  Medication Samples have been provided to the patient.  Drug name: [name]       Strength: 49/51mg          Qty: 28  LOT: [id number]  Exp.Date: 6/22  Dosing instructions: 1 TAB BID  The patient has been instructed regarding the correct time, dose, and frequency of taking this medication, including desired effects and most common side effects.   [name] 11:29 AM 07/11/2019

## 2019-07-11 NOTE — Telephone Encounter (Signed)
Spoke with pt regarding appt on 07/14/19. Pt stated he has not been in contact with anyone who may have covid-19 and has no symptoms.

## 2019-07-11 NOTE — Progress Notes (Signed)
ADVANCED HF CLINIC NOTE   PCP: Primary Cardiologist:   HPI:  58 y/o male with h/o AVR in 4/16, pulmonary HTN, CKD 3, gout.  Admitted in 5/24-05/23/19 with respiratory failure due to HF exacerbation and pseudomonas PNA. He was treated with antibiotics and diuretics. Echo with EF 20-25% with apical clot. Also was in/out of AF/NSR. When in NSR had episodedsof heat block   He was noted to have an LV apical thrombus and started on anticoagulation, but he developed a GI bleed due to suspected stress ulcers.    Feels much better now. Getting walking outside. Says he can walk up to a mile if he walks slowly. No CP, edema, orthopnea or PND. Remains on warfarin No bleeding.    ROS: All systems negative except as listed in HPI, PMH and Problem List.  SH:  Social History   Socioeconomic History  . Marital status: Single    Spouse name: Not on file  . Number of children: Not on file  . Years of education: Not on file  . Highest education level: Not on file  Occupational History  . Not on file  Social Needs  . Financial resource strain: Not on file  . Food insecurity    Worry: Not on file    Inability: Not on file  . Transportation needs    Medical: Not on file    Non-medical: Not on file  Tobacco Use  . Smoking status: Never Smoker  . Smokeless tobacco: Never Used  Substance and Sexual Activity  . Alcohol use: Never    Alcohol/week: 0.0 standard drinks    Frequency: Never  . Drug use: Never  . Sexual activity: Yes    Partners: Female  Lifestyle  . Physical activity    Days per week: Not on file    Minutes per session: Not on file  . Stress: Not on file  Relationships  . Social Herbalist on phone: Not on file    Gets together: Not on file    Attends religious service: Not on file    Active member of club or organization: Not on file    Attends meetings of clubs or organizations: Not on file    Relationship status: Not on file  . Intimate partner  violence    Fear of current or ex partner: Not on file    Emotionally abused: Not on file    Physically abused: Not on file    Forced sexual activity: Not on file  Other Topics Concern  . 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 Heart attack Neg Hx     Past Medical History:  Diagnosis Date  . Aortic valve disease    a. 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 Atrial flutter ([place])   . Benign hypertensive heart and renal disease   . Chronic combined systolic and diastolic CHF (congestive heart failure) ([place])   . Diabetes mellitus ([place])   . Essential hypertension 11/19/2014  . Insomnia   . Murmur   . Noncompliance   . Obesity   . Pulmonary hypertension ([place])     Current Outpatient Medications  Medication Sig Dispense Refill  . allopurinol (ZYLOPRIM) 100 MG tablet Take 100 mg by mouth  daily.    . AMBULATORY NON FORMULARY MEDICATION Herbal Laxative tabs 1 tab by mouth twice a day    . furosemide (LASIX) 40 MG tablet Take 1 tablet (40 mg total) by mouth daily. Take an extra 40 mg of lasix if your weight increase 3 lbs in 1 day or 5 lbs in a week or SOB 90 tablet 3  . hydrALAZINE (APRESOLINE) 25 MG tablet Take 25 mg by mouth 3 (three) times daily.    . isosorbide mononitrate (ISMO) 20 MG tablet Take 20 mg by mouth 2 (two) times daily at 10 AM and 5 PM.    . pantoprazole (PROTONIX) 40 MG tablet Take 1 tablet (40 mg total) by mouth daily. 90 tablet 3  . sacubitril-valsartan (ENTRESTO) 24-26 MG Take 1 tablet by mouth 2 (two) times daily. 180 tablet 3  . spironolactone (ALDACTONE) 25 MG tablet Take 25 mg by mouth daily.    [name] warfarin (COUMADIN) 5 MG tablet Take 5 mg by mouth daily.     No current facility-administered medications for this encounter.     Vitals:   07/11/19 1036  BP:  127/69  Pulse: 67  SpO2: 96%  Weight: 120.7 kg (266 lb)    PHYSICAL EXAM:  General:  Well appearing. No resp difficulty HEENT: normal Neck: supple. JVP flat. Carotids 2+ bilaterally; no bruits. No lymphadenopathy or thryomegaly appreciated. Cor: PMI normal. Regular rate & rhythm. 2/6 SEM RUSB Lungs: clear Abdomen: soft, nontender, nondistended. No hepatosplenomegaly. No bruits or masses. Good bowel sounds. Extremities: no cyanosis, clubbing, rash, edema Neuro: alert & orientedx3, cranial nerves grossly intact. Moves all 4 extremities w/o difficulty. Affect pleasant.   ECG: AF 69 witr RBBB  Personally reviewed    ASSESSMENT & PLAN: 1. Acute systolic HF/cardiogenic shock with biventricular HF - Echo 05/11/19 EF 20-25% with moderately decreased RV function  - Echo 12/19 EF 50% - Due to NICM (coronaries normal in 2016). Suspect due to probable tachy-induced CM (Was in AFL in 140s on admit) - Volume status stable - Increase Entresto 49/51 - Continue hydralazine 25 tid Imdur - Spiro 12.5 bid - Lasix 40 daily - No b-blocker with sinus and AV node dysfunction - Labs today - repeat echo 4-6 weeks   2. PAFL/ AFIB - suspect this may be cause of CM  - however previous ECGs also suggest possible AV dissociation with accelerated junctional outpacing his sinus - Now in AF with rate control  - Has been seen by EP. No need for pacer just yet but may need to consider to permit b-blocker - Continue warfarin   3. AK on CKD 3 - baseline creatinine 1.8 - check BMET today  4. Bioprosthetic AVR in 4/16 - stable on echo  5. Pulmonary HTN - Likely WHO group 2&3  6. LV apical thrombus - on warfarin   7. GIB - had duodenal ulcers. Seen by GI. hgb stable  Glori Bickers, MD  11:22 AM

## 2019-07-11 NOTE — Telephone Encounter (Signed)
Met with patient in clinic today to discuss the approval from [employer] regarding his Entresto. He has been approved until 07/07/2020. He can call the pharmacy and set up his first shipment. When I went to tell him this the provider did decide to change him to the 49-39m dose of Entresto. Spoke with TLinus Ornwho will help in sending in the new RX for him and advised the patient that he can call [employer] later on this afternoon to set up his shipment.  We also provided him with a sample of the new strength to get him through until [facility] [facility]. That information was recorded as well.  Patient understands that he will call them each month when he has about a week left.  ECharlann Boxer CPhT

## 2019-07-14 NOTE — Telephone Encounter (Signed)
Called [employer] today to confirm they received new prescription for Entresto (49-51mg ) and to see if they had been successful with getting in touch with Mr. Holeman.  They have received the new prescription and it is set to go out today with delivery date of Wednesday 07/16/2019.  Charlann Boxer, CPhT

## 2019-07-15 ENCOUNTER — Ambulatory Visit (INDEPENDENT_AMBULATORY_CARE_PROVIDER_SITE_OTHER): Payer: Medicare Other | Admitting: *Deleted

## 2019-07-15 ENCOUNTER — Other Ambulatory Visit: Payer: Self-pay

## 2019-07-15 DIAGNOSIS — I4892 Unspecified atrial flutter: Secondary | ICD-10-CM | POA: Diagnosis not present

## 2019-07-15 DIAGNOSIS — Z5181 Encounter for therapeutic drug level monitoring: Secondary | ICD-10-CM

## 2019-07-15 LAB — POCT INR: INR: 2.1 (ref 2.0–3.0)

## 2019-07-15 MED ORDER — WARFARIN SODIUM 5 MG PO TABS: 5.0000 mg | ORAL_TABLET | Freq: Every day | ORAL | 1 refills | Status: DC

## 2019-07-15 NOTE — Patient Instructions (Addendum)
Description   Continue taking 1.5 tablets every day except for 2 tablets on Monday and on Friday. Recheck INR in  1.5 weeks. Call coumadin clinic # [phone], if you have any bleeding, procedures or changes in medication. HF # [id number],  [facility] main # [phone]

## 2019-07-24 ENCOUNTER — Ambulatory Visit (INDEPENDENT_AMBULATORY_CARE_PROVIDER_SITE_OTHER): Payer: Medicare Other | Admitting: Pharmacist

## 2019-07-24 ENCOUNTER — Other Ambulatory Visit: Payer: Self-pay

## 2019-07-24 DIAGNOSIS — Z5181 Encounter for therapeutic drug level monitoring: Secondary | ICD-10-CM

## 2019-07-24 DIAGNOSIS — I513 Intracardiac thrombosis, not elsewhere classified: Secondary | ICD-10-CM | POA: Diagnosis not present

## 2019-07-24 DIAGNOSIS — Q231 Congenital insufficiency of aortic valve: Secondary | ICD-10-CM | POA: Diagnosis not present

## 2019-07-24 DIAGNOSIS — I4892 Unspecified atrial flutter: Secondary | ICD-10-CM | POA: Diagnosis not present

## 2019-07-24 DIAGNOSIS — I240 Acute coronary thrombosis not resulting in myocardial infarction: Secondary | ICD-10-CM

## 2019-07-24 LAB — POCT INR: INR: 2.7 (ref 2.0–3.0)

## 2019-07-24 MED ORDER — WARFARIN SODIUM 5 MG PO TABS: ORAL_TABLET | ORAL | 0 refills | Status: DC

## 2019-07-24 NOTE — Patient Instructions (Signed)
Description   Continue taking 1.5 tablets every day except for 2 tablets on Monday and on Friday. Will start following with PCP Dr Joline Salt in [place] - has a follow up appt scheduled already.

## 2019-08-04 ENCOUNTER — Telehealth: Payer: Self-pay

## 2019-08-04 NOTE — Telephone Encounter (Signed)
Spoke with pt regarding appt on 08/06/19. Pt stated he will check his vitals prior to appt and did not have any questions at this time.

## 2019-08-06 ENCOUNTER — Encounter: Payer: Self-pay | Admitting: Internal Medicine

## 2019-08-06 ENCOUNTER — Telehealth (INDEPENDENT_AMBULATORY_CARE_PROVIDER_SITE_OTHER): Payer: Medicare Other | Admitting: Internal Medicine

## 2019-08-06 ENCOUNTER — Other Ambulatory Visit: Payer: Self-pay

## 2019-08-06 VITALS — BP 112/74 | HR 57 | Ht 69.5 in | Wt 258.0 lb

## 2019-08-06 DIAGNOSIS — I4819 Other persistent atrial fibrillation: Secondary | ICD-10-CM

## 2019-08-06 DIAGNOSIS — I5022 Chronic systolic (congestive) heart failure: Secondary | ICD-10-CM

## 2019-08-06 DIAGNOSIS — I513 Intracardiac thrombosis, not elsewhere classified: Secondary | ICD-10-CM

## 2019-08-06 DIAGNOSIS — I240 Acute coronary thrombosis not resulting in myocardial infarction: Secondary | ICD-10-CM

## 2019-08-06 DIAGNOSIS — I4892 Unspecified atrial flutter: Secondary | ICD-10-CM | POA: Diagnosis not present

## 2019-08-06 NOTE — Progress Notes (Signed)
Electrophysiology TeleHealth Note  Due to national recommendations of social distancing due to [place] 19, Audio  telehealth visit is felt to be most appropriate for this patient at this time. Verbal consent obtained from today for telehealth for [facility]  He does not have technology to allow for video visit.  A phone call is therefore performed.  Date:  08/06/2019   ID:  [name], DOB 08/12/1961, MRN [id number]  Location: home  Provider location: [address] [place] Evaluation Performed: New patient consult  PCP:  [name], MD  Cardiologist:  [name], MD  CHF:  Dr Haroldine Laws Electrophysiologist:  None   Chief Complaint:  Atrial arrhythmias/ CHF  History of Present Illness:    [name] is a 58 y.o. male who presents via audio conferencing for a telehealth visit today.   He was previously seen by me during a prior hospitalization for CHF in May (my consult note reviewed).  He was noted to have sinus node dysfunction with a slow sinus rate and frequent accelerated junctional rhythm.  He also had significant AV conduction disease with a very long first degree AV block and RBBB. He has a h/o atrial fibrillation and atypical atrial flutter and is s/p prior MAZE.    Since hospital discharge, he has done well.  He currently states "I feel great".  He provides a lot of details about his sexual activity over the past few weeks which he uses as a gauge for his quality of life.  He has no concerns currently.  Today, he denies symptoms of palpitations, chest pain, shortness of breath, orthopnea, PND, lower extremity edema, claudication, dizziness, presyncope, syncope, bleeding, or neurologic sequela. The patient is tolerating medications without difficulties and is otherwise without complaint today.     Past Medical History:  Diagnosis Date  . Aortic valve disease    a. severe AI/severe AS/bicuspid AV s/p bioprosthetic aortic valve with  replacement of ascending aorta 2016  . Atrial flutter ([place])   . Benign hypertensive heart and renal disease   . Chronic combined systolic and diastolic CHF (congestive heart failure) ([place])   . Diabetes mellitus ([place])   . Essential hypertension 11/19/2014  . Insomnia   . Murmur   . Noncompliance   . Obesity   . Pulmonary hypertension ([place])     Past Surgical History:  Procedure Laterality Date  . AORTIC VALVE REPLACEMENT N/A 04/05/2015   Procedure: AORTIC VALVE REPLACEMENT (AVR) using a 36mm Edwards Aortic Magna Ease Valve ;  Surgeon: [name], MD;  Location: [place];  Service: Open Heart Surgery;  Laterality: N/A;  . ESOPHAGOGASTRODUODENOSCOPY (EGD) WITH PROPOFOL N/A 05/19/2019   Procedure: ESOPHAGOGASTRODUODENOSCOPY (EGD) WITH PROPOFOL;  Surgeon: [name], MD;  Location: [place];  Service: Gastroenterology;  Laterality: N/A;  . HEMOSTASIS CLIP PLACEMENT  05/19/2019   Procedure: HEMOSTASIS CLIP PLACEMENT;  Surgeon: [name], MD;  Location[facility] ENDOSCOPY;  Service: Gastroenterology;;  . LEFT AND RIGHT HEART CATHETERIZATION WITH CORONARY ANGIOGRAM N/A 11/27/2014   Procedure: LEFT AND RIGHT HEART CATHETERIZATION WITH CORONARY ANGIOGRAM;  Surgeon: [name], MD;  Location: [facility] CATH LAB;  Service: Cardiovascular;  Laterality: N/A;  . MAZE N/A 04/05/2015   Procedure: MAZE;  Surgeon: [name], MD;  Location: [place];  Service: Open Heart Surgery;  Laterality: N/A;  . REPLACEMENT ASCENDING AORTA N/A 04/05/2015   Procedure: REPLACEMENT ASCENDING AORTA with a 43mm Hemashield Platinum Graft;  Surgeon:  [name], MD;  Location: [place];  Service: Open Heart Surgery;  Laterality: N/A;  . TEE WITHOUT CARDIOVERSION N/A 04/05/2015   Procedure: TRANSESOPHAGEAL ECHOCARDIOGRAM (TEE);  Surgeon: [name], MD;  Location: [place];  Service: Open Heart Surgery;  Laterality: N/A;    Current Outpatient Medications  Medication Sig Dispense Refill  . allopurinol (ZYLOPRIM) 100 MG  tablet Take 100 mg by mouth daily.    . AMBULATORY NON FORMULARY MEDICATION Herbal Laxative tabs 1 tab by mouth twice a day    . ferrous sulfate 325 (65 FE) MG EC tablet Take 325 mg by mouth daily.    . furosemide (LASIX) 40 MG tablet Take 1 tablet (40 mg total) by mouth daily. Take an extra 40 mg of lasix if your weight increase 3 lbs in 1 day or 5 lbs in a week or SOB 90 tablet 3  . hydrALAZINE (APRESOLINE) 25 MG tablet Take 25 mg by mouth 3 (three) times daily.    . isosorbide mononitrate (ISMO) 20 MG tablet Take 20 mg by mouth 2 (two) times daily at 10 AM and 5 PM.    . pantoprazole (PROTONIX) 40 MG tablet Take 1 tablet (40 mg total) by mouth daily. 90 tablet 3  . sacubitril-valsartan (ENTRESTO) 49-51 MG Take 1 tablet by mouth 2 (two) times daily. 180 tablet 3  . spironolactone (ALDACTONE) 25 MG tablet Take 25 mg by mouth daily.    [name] warfarin (COUMADIN) 5 MG tablet Take 1.5 to 2 tablets daily as directed by Coumadin clinic 60 tablet 0   No current facility-administered medications for this visit.     Allergies:   Patient has no known allergies.   Social History:  The patient  reports that he has never smoked. He has never used smokeless tobacco. He reports that he does not drink alcohol or use drugs.   Family History:  + HTN, DM   ROS:  Please see the history of present illness.   All other systems are personally reviewed and negative.    Exam:    Vital Signs:  BP 112/74   Pulse (!) 57   Ht 5' 9.5" (1.765 m)   Wt 258 lb (117 kg)   BMI 37.55 kg/m    Well sounding   Labs/Other Tests and Data Reviewed:    Recent Labs: 05/15/2019: ALT 47 05/23/2019: Magnesium 2.0 07/10/2019: Hemoglobin 9.8; Platelets 323.0 07/11/2019: B Natriuretic Peptide 789.9; BUN 10; Creatinine, Ser 1.18; Potassium 4.1; Sodium 137   Wt Readings from Last 3 Encounters:  08/06/19 258 lb (117 kg)  07/11/19 266 lb (120.7 kg)  07/10/19 264 lb 8 oz (120 kg)     Other studies personally reviewed:  Additional studies/ records that were reviewed today include: my prior consult note,  CHF records  Review of the above records today demonstrates: echo 05/11/2019 EF of 20% with Moderate LVH and apical thrombus  ekg 07/11/2019- afib (rate controlled), RBBB, inferior infarct pattern  ASSESSMENT & PLAN:    1.  Chronic systolic dysfunction with bivenricular HF Nonischemic CM Currently clinically improved with NYHA Class II/III CHF Medicine optimization per Dr Haroldine Laws Not currently interested in ICD therapy.  I have spoken with Dr Haroldine Laws this am and we agree that he is probably a poor candidate for ICD long temr.  2. Persistent afib/ atypical atrial flutter Rate controlled On coumadin Not an ablation candidate,  He has previously failed MAZE Continue current medicines  3. AV conduction system disease Currently stable  Avoid pacing if able  Follow-up:  With Dr Haroldine Laws as scheduled I will see as needed  Current medicines are reviewed at length with the patient today.   The patient does not have concerns regarding his medicines.  The following changes were made today:  none  Labs/ tests ordered today include:  No orders of the defined types were placed in this encounter.   Patient Risk:  after full review of this patients clinical status, I feel that they are at moderate risk at this time.   Today, I have spent 20 minutes with the patient with telehealth technology discussing atrial arrhythmias and CHF .    Signed, Thompson Grayer MD, [place] 08/06/2019 9:13 AM   [address] [place] [phone] (office) [phone] (fax)

## 2019-08-07 ENCOUNTER — Other Ambulatory Visit: Payer: Self-pay | Admitting: Cardiology

## 2019-08-07 MED ORDER — AMOXICILLIN 500 MG PO TABS: 2000.0000 mg | ORAL_TABLET | Freq: Once | ORAL | 3 refills | Status: DC | PRN

## 2019-08-07 NOTE — Telephone Encounter (Signed)
Pt has Bioprosthetic AVR so he does require antibiotics prior to dental work.  RX sent in, pt is aware and very appreciative.

## 2019-08-07 NOTE — Telephone Encounter (Signed)
Needs to be addressed by Dr. Haroldine Laws - I have not seen patient in 4 years

## 2019-08-07 NOTE — Telephone Encounter (Signed)
Pt calling asking for Dr. Radford Pax to prescribe a antibiotic for pt's dental appt on Monday. Please address

## 2019-08-14 ENCOUNTER — Other Ambulatory Visit (HOSPITAL_COMMUNITY): Payer: Self-pay

## 2019-08-14 ENCOUNTER — Telehealth (HOSPITAL_COMMUNITY): Payer: Self-pay | Admitting: Licensed Clinical Social Worker

## 2019-08-14 MED ORDER — SPIRONOLACTONE 25 MG PO TABS: 25.0000 mg | ORAL_TABLET | Freq: Every day | ORAL | 3 refills | Status: DC

## 2019-08-14 NOTE — Telephone Encounter (Signed)
Pt called and requested help getting a refill on spirolactone- CSW had RN send in new script to pharmacy- pt informed  Jorge Ny, [facility] Desk#: [phone] Cell#: [phone]

## 2019-08-20 ENCOUNTER — Encounter (HOSPITAL_COMMUNITY): Payer: Self-pay | Admitting: Internal Medicine

## 2019-08-20 ENCOUNTER — Other Ambulatory Visit: Payer: Self-pay

## 2019-08-20 ENCOUNTER — Ambulatory Visit (HOSPITAL_BASED_OUTPATIENT_CLINIC_OR_DEPARTMENT_OTHER)
Admission: RE | Admit: 2019-08-20 | Discharge: 2019-08-20 | Disposition: A | Discharge status: Home / Self Care | Payer: Medicare Other | Source: Ambulatory Visit | Attending: Internal Medicine | Admitting: Internal Medicine

## 2019-08-20 ENCOUNTER — Ambulatory Visit (HOSPITAL_COMMUNITY)
Admission: RE | Admit: 2019-08-20 | Discharge: 2019-08-20 | Disposition: A | Discharge status: Home / Self Care | Payer: Medicare Other | Source: Ambulatory Visit | Attending: Internal Medicine | Admitting: Internal Medicine

## 2019-08-20 VITALS — BP 130/71 | HR 61 | Wt 265.4 lb

## 2019-08-20 DIAGNOSIS — I4891 Unspecified atrial fibrillation: Secondary | ICD-10-CM | POA: Diagnosis not present

## 2019-08-20 DIAGNOSIS — G4700 Insomnia, unspecified: Secondary | ICD-10-CM | POA: Insufficient documentation

## 2019-08-20 DIAGNOSIS — Z953 Presence of xenogenic heart valve: Secondary | ICD-10-CM | POA: Insufficient documentation

## 2019-08-20 DIAGNOSIS — I5022 Chronic systolic (congestive) heart failure: Secondary | ICD-10-CM

## 2019-08-20 DIAGNOSIS — I272 Pulmonary hypertension, unspecified: Secondary | ICD-10-CM | POA: Diagnosis not present

## 2019-08-20 DIAGNOSIS — I130 Hypertensive heart and chronic kidney disease with heart failure and stage 1 through stage 4 chronic kidney disease, or unspecified chronic kidney disease: Secondary | ICD-10-CM | POA: Diagnosis not present

## 2019-08-20 DIAGNOSIS — I513 Intracardiac thrombosis, not elsewhere classified: Secondary | ICD-10-CM | POA: Insufficient documentation

## 2019-08-20 DIAGNOSIS — E1122 Type 2 diabetes mellitus with diabetic chronic kidney disease: Secondary | ICD-10-CM | POA: Insufficient documentation

## 2019-08-20 DIAGNOSIS — N183 Chronic kidney disease, stage 3 (moderate): Secondary | ICD-10-CM | POA: Insufficient documentation

## 2019-08-20 DIAGNOSIS — I4819 Other persistent atrial fibrillation: Secondary | ICD-10-CM

## 2019-08-20 DIAGNOSIS — M109 Gout, unspecified: Secondary | ICD-10-CM | POA: Insufficient documentation

## 2019-08-20 DIAGNOSIS — Z7901 Long term (current) use of anticoagulants: Secondary | ICD-10-CM | POA: Diagnosis not present

## 2019-08-20 DIAGNOSIS — Z952 Presence of prosthetic heart valve: Secondary | ICD-10-CM

## 2019-08-20 DIAGNOSIS — N182 Chronic kidney disease, stage 2 (mild): Secondary | ICD-10-CM

## 2019-08-20 DIAGNOSIS — I5042 Chronic combined systolic (congestive) and diastolic (congestive) heart failure: Secondary | ICD-10-CM | POA: Diagnosis present

## 2019-08-20 DIAGNOSIS — I4892 Unspecified atrial flutter: Secondary | ICD-10-CM | POA: Insufficient documentation

## 2019-08-20 DIAGNOSIS — I428 Other cardiomyopathies: Secondary | ICD-10-CM | POA: Diagnosis not present

## 2019-08-20 DIAGNOSIS — E669 Obesity, unspecified: Secondary | ICD-10-CM | POA: Insufficient documentation

## 2019-08-20 DIAGNOSIS — Z79899 Other long term (current) drug therapy: Secondary | ICD-10-CM | POA: Diagnosis not present

## 2019-08-20 DIAGNOSIS — K269 Duodenal ulcer, unspecified as acute or chronic, without hemorrhage or perforation: Secondary | ICD-10-CM | POA: Insufficient documentation

## 2019-08-20 MED ORDER — SACUBITRIL-VALSARTAN 97-103 MG PO TABS: 1.0000 | ORAL_TABLET | Freq: Two times a day (BID) | ORAL | 6 refills | Status: DC

## 2019-08-20 MED ORDER — PERFLUTREN LIPID MICROSPHERE
1.0000 mL | INTRAVENOUS | Status: DC | PRN
  Administered 2019-08-20: 4 mL via INTRAVENOUS
  Filled 2019-08-20: qty 10

## 2019-08-20 MED ORDER — SACUBITRIL-VALSARTAN 97-103 MG PO TABS: 1.0000 | ORAL_TABLET | Freq: Two times a day (BID) | ORAL | 3 refills | Status: DC

## 2019-08-20 NOTE — Progress Notes (Signed)
ADVANCED HF CLINIC NOTE   PCP: Primary Cardiologist:   HPI:  58 y/o male with h/o AVR in 4/16, pulmonary HTN, CKD 3, gout.  Admitted in 5/24-05/23/19 with respiratory failure due to HF exacerbation and pseudomonas PNA. He was treated with antibiotics and diuretics. Echo with EF 20-25% with apical clot. Also was in/out of AF/NSR. When in NSR had episodedsof heat block   He was noted to have an LV apical thrombus and started on anticoagulation, but he developed a GI bleed due to suspected stress ulcers.    Recently saw Dr. [name]. Remains in AF. No further pauses. We discussed and PM deferred.   Feels good. Starting to workout with Risk manager. No CP, SOB, orthopnea or PND. Doing cardio and weights.   Echo today EF still 25% AVR stable. Moderate RV HK Personally reviewed   ROS: All systems negative except as listed in HPI, PMH and Problem List.  SH:  Social History   Socioeconomic History  . Marital status: Single    Spouse name: Not on file  . Number of children: Not on file  . Years of education: Not on file  . Highest education level: Not on file  Occupational History  . Not on file  Social Needs  . Financial resource strain: Not on file  . Food insecurity    Worry: Not on file    Inability: Not on file  . Transportation needs    Medical: Not on file    Non-medical: Not on file  Tobacco Use  . Smoking status: Never Smoker  . Smokeless tobacco: Never Used  Substance and Sexual Activity  . Alcohol use: Never    Alcohol/week: 0.0 standard drinks    Frequency: Never  . Drug use: Never  . Sexual activity: Yes    Partners: Female  Lifestyle  . Physical activity    Days per week: Not on file    Minutes per session: Not on file  . Stress: Not on file  Relationships  . Social Herbalist on phone: Not on file    Gets together: Not on file    Attends religious service: Not on file    Active member of club or organization: Not on file   Attends meetings of clubs or organizations: Not on file    Relationship status: Not on file  . Intimate partner violence    Fear of current or ex partner: Not on file    Emotionally abused: Not on file    Physically abused: Not on file    Forced sexual activity: Not on file  Other Topics Concern  . 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 Heart attack Neg Hx     Past Medical History:  Diagnosis Date  . Aortic valve disease    a. 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 Atrial flutter ([place])   . Benign hypertensive heart and renal disease   . Chronic combined systolic and diastolic CHF (congestive heart failure) ([place])   . Diabetes mellitus ([place])   . Essential hypertension 11/19/2014  . Insomnia   . Murmur   . Noncompliance   . Obesity   . Pulmonary hypertension ([place])     Current Outpatient Medications  Medication Sig Dispense Refill  . allopurinol (ZYLOPRIM) 100 MG tablet Take 100 mg by mouth daily.    . AMBULATORY NON FORMULARY MEDICATION Herbal Laxative tabs 1 tab by mouth twice a day    . amoxicillin (AMOXIL) 500 MG tablet Take 4 tablets (2,000 mg total) by mouth once as needed for up to 1 dose (take 1 hour prior to dental appointment). 4 tablet 3  . ferrous sulfate 325 (65 FE) MG EC tablet Take 325 mg by mouth daily.    . furosemide (LASIX) 40 MG tablet Take 1 tablet (40 mg total) by mouth daily. Take an extra 40 mg of lasix if your weight increase 3 lbs in 1 day or 5 lbs in a week or SOB 90 tablet 3  . hydrALAZINE (APRESOLINE) 25 MG tablet Take 25 mg by mouth 3 (three) times daily.    . isosorbide mononitrate (ISMO) 20 MG tablet Take 20 mg by mouth 2 (two) times daily at 10 AM and 5 PM.    . pantoprazole (PROTONIX) 40 MG tablet Take 1 tablet (40 mg total) by mouth daily. 90  tablet 3  . sacubitril-valsartan (ENTRESTO) 49-51 MG Take 1 tablet by mouth 2 (two) times daily. 180 tablet 3  . spironolactone (ALDACTONE) 25 MG tablet Take 1 tablet (25 mg total) by mouth daily. 90 tablet 3  . warfarin (COUMADIN) 5 MG tablet Take 1.5 to 2 tablets daily as directed by Coumadin clinic 60 tablet 0   No current facility-administered medications for this encounter.    Facility-Administered Medications Ordered in Other Encounters  Medication Dose Route Frequency Provider Last Rate Last Dose  . perflutren lipid microspheres (DEFINITY) IV suspension  1-10 mL Intravenous PRN , Shaune Pascal, MD   4 mL at 08/20/19 1111    Vitals:   08/20/19 1203  BP: 130/71  Pulse: 61  SpO2: 97%  Weight: 120.4 kg (265 lb 6.4 oz)    PHYSICAL EXAM: General:  Well appearing. Muscular.  No resp difficulty HEENT: normal Neck: supple. no JVD. Carotids 2+ bilat; no bruits. No lymphadenopathy or thryomegaly appreciated. Cor: PMI nondisplaced. Irregular rate & rhythm. No rubs, gallops or murmurs. Lungs: clear Abdomen: obese soft, nontender, nondistended. No hepatosplenomegaly. No bruits or masses. Good bowel sounds. Extremities: no cyanosis, clubbing, rash, edema Neuro: alert & orientedx3, cranial nerves grossly intact. moves all 4 extremities w/o difficulty. Affect pleasant   ECG: AF 73 RBBB +PVCs Personally reviewed    ASSESSMENT & PLAN: 1. Chronic systolic HF with biventricular failure - Echo 05/11/19 EF 20-25% with moderately decreased RV function  - Echo 12/19 EF 50% - Due to NICM (coronaries normal in 2016). Suspect due to probable tachy-induced CM (Was in AFL in 140s on admit) - Echo done today EF still 25% Personally reviewed - Volume status stable - NYHA I-II - Increase Entresto to 97/103 - Continue hydralazine 25 tid Imdur - Spiro 12.5 bid - Lasix 40 daily - No b-blocker with sinus and AV node dysfunction - Labs today - If EF not improving will need to reconsider biv ICD   2. Persitent AFL/ AFIB with tachy-brady syndrome - has failed previous Maze. Not felt to be candidate for ablation - suspect this may be contributing to CM but rates no longer fast  - previous ECGs had  AV dissociation with accelerated junctional outpacing his sinus - Now in AF with rate control  - Has been seen by EP. No need for pacer just yet but may need to consider to  permit b-blocker. D/w Dr. [name] - Continue warfarin  3. CKD 3 - baseline creatinine 1.8 - check BMET today  4. Bioprosthetic AVR in 4/16 - stable on echo  5. Pulmonary HTN - Likely WHO group 2&3  6. LV apical thrombus - on warfarin   7. GIB - had duodenal ulcers. Seen by GI. hgb stable  Glori Bickers, MD  12:31 PM

## 2019-08-20 NOTE — Patient Instructions (Signed)
INCREASE Entresto to 97/103mg  (1 tab) twice a day  Your physician recommends that you schedule a follow-up appointment in: 2-3 months with Dr Haroldine Laws.   At the [facility], you and your health needs are our priority. As part of our continuing mission to provide you with exceptional heart care, we have created designated Provider Care Teams. These Care Teams include your primary Cardiologist (physician) and Advanced Practice Providers (APPs- Physician Assistants and Nurse Practitioners) who all work together to provide you with the care you need, when you need it.   You may see any of the following providers on your designated Care Team at your next follow up: [name] Dr Glori Bickers . Dr Loralie Champagne . Darrick Grinder, NP   Please be sure to bring in all your medications bottles to every appointment.

## 2019-08-20 NOTE — Progress Notes (Signed)
Echocardiogram 2D Echocardiogram has been performed.  Oneal Deputy [name] 08/20/2019, 11:16 AM

## 2019-08-21 ENCOUNTER — Telehealth (HOSPITAL_COMMUNITY): Payer: Self-pay | Admitting: Pharmacy Technician

## 2019-08-21 NOTE — Telephone Encounter (Signed)
Received a notification that patient needed a PA on Entresto 97-103mg . Patient should be getting his medication through [employer]. I called and confirmed with patient that he still wanted to [place] [employer] and he does. Called [facility] and cancelled new dose of Entresto with them.   Called [employer] to confirm that they have the new dose of Entresto on file and they do. Representative informed me that the patient just needed to call and set up shipment. Called patient back and informed him of this and provided him of [employer] phone number.  [phone]  Charlann Boxer, CPhT

## 2019-08-22 ENCOUNTER — Other Ambulatory Visit (HOSPITAL_COMMUNITY): Payer: Self-pay

## 2019-08-22 MED ORDER — SACUBITRIL-VALSARTAN 97-103 MG PO TABS: 1.0000 | ORAL_TABLET | Freq: Two times a day (BID) | ORAL | 3 refills | Status: DC

## 2019-09-01 ENCOUNTER — Other Ambulatory Visit: Payer: Self-pay | Admitting: Cardiology

## 2019-09-02 ENCOUNTER — Telehealth: Payer: Self-pay | Admitting: Internal Medicine

## 2019-09-02 NOTE — Telephone Encounter (Signed)
He should have OV.  Dr. Carlean Purl wanted him to remain on Coumadin for :"a while".

## 2019-09-02 NOTE — Telephone Encounter (Signed)
Pt is due for a colon in October, he is on warfarin. He was just seen in the office this past July. Does he need an ov again or can he be a direct book? pls advise.

## 2019-09-05 NOTE — Telephone Encounter (Signed)
OV scheduled for 10/28 at 9:50am.

## 2019-09-10 ENCOUNTER — Telehealth: Payer: Self-pay | Admitting: *Deleted

## 2019-09-10 NOTE — Telephone Encounter (Signed)
Prescription refill request received for Coumadin. Called pt who stated that he needed his Coumadin refilled and that he is not being followed by Dr. Joline Salt in [place]. ( See anti-coag visit 07/24/2019). Pt stated he ended up not moving to [place]. Scheduled pt for an Anti-coag visit for 09/16/19. Pt stated he had 2 weeks supply left of his Coumadin, will refill prescription at his next visit.

## 2019-09-16 ENCOUNTER — Ambulatory Visit (INDEPENDENT_AMBULATORY_CARE_PROVIDER_SITE_OTHER): Payer: Medicare Other | Admitting: *Deleted

## 2019-09-16 ENCOUNTER — Other Ambulatory Visit: Payer: Self-pay

## 2019-09-16 DIAGNOSIS — I4892 Unspecified atrial flutter: Secondary | ICD-10-CM | POA: Diagnosis not present

## 2019-09-16 DIAGNOSIS — Z5181 Encounter for therapeutic drug level monitoring: Secondary | ICD-10-CM | POA: Diagnosis not present

## 2019-09-16 LAB — POCT INR: INR: 2.4 (ref 2.0–3.0)

## 2019-09-16 NOTE — Patient Instructions (Signed)
Description   Continue taking 1.5 tablets every day except for 2 tablets on Monday and on Friday. Be consistent with your greens. Get INR rechecked in 3 weeks. [phone]

## 2019-09-18 ENCOUNTER — Other Ambulatory Visit: Payer: Self-pay | Admitting: Cardiology

## 2019-09-22 ENCOUNTER — Other Ambulatory Visit (HOSPITAL_COMMUNITY): Payer: Self-pay

## 2019-09-22 MED ORDER — HYDRALAZINE HCL 25 MG PO TABS: 25.0000 mg | ORAL_TABLET | Freq: Three times a day (TID) | ORAL | 3 refills | Status: DC

## 2019-09-22 MED ORDER — ALLOPURINOL 100 MG PO TABS: 100.0000 mg | ORAL_TABLET | Freq: Every day | ORAL | 3 refills | Status: DC

## 2019-10-07 ENCOUNTER — Other Ambulatory Visit: Payer: Self-pay

## 2019-10-07 ENCOUNTER — Ambulatory Visit (INDEPENDENT_AMBULATORY_CARE_PROVIDER_SITE_OTHER): Payer: Medicare Other | Admitting: *Deleted

## 2019-10-07 DIAGNOSIS — I4892 Unspecified atrial flutter: Secondary | ICD-10-CM | POA: Diagnosis not present

## 2019-10-07 DIAGNOSIS — Z5181 Encounter for therapeutic drug level monitoring: Secondary | ICD-10-CM | POA: Diagnosis not present

## 2019-10-07 LAB — POCT INR: INR: 2.6 (ref 2.0–3.0)

## 2019-10-07 NOTE — Patient Instructions (Signed)
Description   Continue taking 1.5 tablets every day except for 2 tablets on Monday and on Friday. Be consistent with your greens. Get INR rechecked in 4 weeks. [phone]. Pt not moving to [place] and still wants to be followed here.

## 2019-10-15 ENCOUNTER — Telehealth: Payer: Self-pay

## 2019-10-15 ENCOUNTER — Encounter: Payer: Self-pay | Admitting: Internal Medicine

## 2019-10-15 ENCOUNTER — Ambulatory Visit (INDEPENDENT_AMBULATORY_CARE_PROVIDER_SITE_OTHER): Payer: Medicare Other | Admitting: Internal Medicine

## 2019-10-15 ENCOUNTER — Other Ambulatory Visit (INDEPENDENT_AMBULATORY_CARE_PROVIDER_SITE_OTHER): Payer: Medicare Other

## 2019-10-15 VITALS — BP 100/64 | HR 76 | Temp 97.0°F | Ht 69.5 in | Wt 272.4 lb

## 2019-10-15 DIAGNOSIS — K250 Acute gastric ulcer with hemorrhage: Secondary | ICD-10-CM | POA: Diagnosis not present

## 2019-10-15 DIAGNOSIS — D509 Iron deficiency anemia, unspecified: Secondary | ICD-10-CM | POA: Diagnosis not present

## 2019-10-15 DIAGNOSIS — I4892 Unspecified atrial flutter: Secondary | ICD-10-CM

## 2019-10-15 DIAGNOSIS — Z7901 Long term (current) use of anticoagulants: Secondary | ICD-10-CM | POA: Diagnosis not present

## 2019-10-15 LAB — CBC WITH DIFFERENTIAL/PLATELET
Basophils Absolute: 0 10*3/uL (ref 0.0–0.1)
Basophils Relative: 0.5 % (ref 0.0–3.0)
Eosinophils Absolute: 0.1 10*3/uL (ref 0.0–0.7)
Eosinophils Relative: 1.4 % (ref 0.0–5.0)
HCT: 47.2 % (ref 39.0–52.0)
Hemoglobin: 15.5 g/dL (ref 13.0–17.0)
Lymphocytes Relative: 20.5 % (ref 12.0–46.0)
Lymphs Abs: 1.5 10*3/uL (ref 0.7–4.0)
MCHC: 32.8 g/dL (ref 30.0–36.0)
MCV: 78.2 fl (ref 78.0–100.0)
Monocytes Absolute: 0.6 10*3/uL (ref 0.1–1.0)
Monocytes Relative: 8.3 % (ref 3.0–12.0)
Neutro Abs: 5 10*3/uL (ref 1.4–7.7)
Neutrophils Relative %: 69.3 % (ref 43.0–77.0)
Platelets: 200 10*3/uL (ref 150.0–400.0)
RBC: 6.03 Mil/uL — ABNORMAL HIGH (ref 4.22–5.81)
RDW: 26.1 % — ABNORMAL HIGH (ref 11.5–15.5)
WBC: 7.2 10*3/uL (ref 4.0–10.5)

## 2019-10-15 NOTE — H&P (View-Only) (Signed)
[name] 58 y.o. 1961-06-10 [id number]  Assessment & Plan:   Encounter Diagnoses  Name Primary?  . Iron deficiency anemia, unspecified iron deficiency anemia type Yes  . Long term current use of anticoagulant   . Atrial flutter, unspecified type ([place])   . Acute gastric ulcer with hemorrhage     The patient is improved.  It is an appropriate time to schedule colonoscopy which will be done at the hospital because of his reduced ejection fraction.  We will query cardiology regarding holding his warfarin, I explained there is a rare but real risk of a stroke off that.  It looks like his clot in the LV is gone as of September.  Recheck CBC and ferritin today.  Further plans pending clinical course.  The risks and benefits as well as alternatives of endoscopic procedure(s) have been discussed and reviewed. All questions answered. The patient agrees to proceed.     Subjective:   Chief Complaint: History of gastric ulcers iron deficiency anemia  HPI Patient today is here for follow-up.  He had an EGD with multiple bleeding gastric ulcers in the setting of anticoagulation in June.  He recovered from that he has been on PPI he was iron deficient and has been taking ferrous sulfate.  I had thought a colonoscopy would be appropriate but wanted to wait until he could more safely hold his warfarin because he had an LV thrombus this summer.  Recent echocardiogram shows that that is gone (early September).  No significant dyspnea.  He is wanting to return to the gym but is concerned about Covid so he has avoided it.  This is frustrating to him. No Known Allergies Current Meds  Medication Sig  . allopurinol (ZYLOPRIM) 100 MG tablet Take 1 tablet (100 mg total) by mouth daily.  . AMBULATORY NON FORMULARY MEDICATION Herbal Laxative tabs 1 tab by mouth twice a day  . ferrous sulfate 325 (65 FE) MG EC tablet Take 325 mg by mouth daily.  . furosemide (LASIX) 40 MG tablet Take 1 tablet  (40 mg total) by mouth daily. Take an extra 40 mg of lasix if your weight increase 3 lbs in 1 day or 5 lbs in a week or SOB  . hydrALAZINE (APRESOLINE) 25 MG tablet Take 1 tablet (25 mg total) by mouth 3 (three) times daily.  . isosorbide mononitrate (ISMO) 20 MG tablet Take 20 mg by mouth 2 (two) times daily at 10 AM and 5 PM.  . pantoprazole (PROTONIX) 40 MG tablet Take 1 tablet (40 mg total) by mouth daily.  . sacubitril-valsartan (ENTRESTO) 97-103 MG Take 1 tablet by mouth 2 (two) times daily.  [name] spironolactone (ALDACTONE) 25 MG tablet Take 1 tablet (25 mg total) by mouth daily.  [name] warfarin (COUMADIN) 5 MG tablet TAKE ONE AND ONE-HALF TO TWO TABLETS BY MOUTH AS DIRECTED BY COUMADIN CLINIC   Past Medical History:  Diagnosis Date  . Acute respiratory failure ([place]) 05/11/2019  . Aortic valve disease    a. 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 Atrial flutter ([place])   . Benign hypertensive heart and renal disease   . Cardiogenic shock ([place])   . Chronic combined systolic and diastolic CHF (congestive heart failure) ([place])   . Chronic kidney disease (CKD), stage III (moderate)   . Diabetes mellitus ([place])   . Essential hypertension 11/19/2014  . Gout   . Hyperlipidemia   . Insomnia   .  Iron deficiency anemia   . Murmur   . Noncompliance   . Obesity   . Pseudomonas pneumonia ([place]) 05/16/2019  . Pulmonary hypertension ([place])    Past Surgical History:  Procedure Laterality Date  . AORTIC VALVE REPLACEMENT N/A 04/05/2015   Procedure: AORTIC VALVE REPLACEMENT (AVR) using a 69mm Edwards Aortic Magna Ease Valve ;  Surgeon: [name], MD;  Location: [facility];  Service: Open Heart Surgery;  Laterality: N/A;  . ESOPHAGOGASTRODUODENOSCOPY (EGD) WITH PROPOFOL N/A 05/19/2019   Procedure: ESOPHAGOGASTRODUODENOSCOPY (EGD) WITH PROPOFOL;  Surgeon: [name], MD;  Location: [facility];  Service: Gastroenterology;  Laterality: N/A;   . HEMOSTASIS CLIP PLACEMENT  05/19/2019   Procedure: HEMOSTASIS CLIP PLACEMENT;  Surgeon: [name], MD;  [facility] ENDOSCOPY;  Service: Gastroenterology;;  . LEFT AND RIGHT HEART CATHETERIZATION WITH CORONARY ANGIOGRAM N/A 11/27/2014   Procedure: LEFT AND RIGHT HEART CATHETERIZATION WITH CORONARY ANGIOGRAM;  Surgeon: [name], MD;  Location: [facility] CATH LAB;  Service: Cardiovascular;  Laterality: N/A;  . MAZE N/A 04/05/2015   Procedure: MAZE;  Surgeon: [name], MD;  Location: [facility];  Service: Open Heart Surgery;  Laterality: N/A;  . REPLACEMENT ASCENDING AORTA N/A 04/05/2015   Procedure: REPLACEMENT ASCENDING AORTA with a 11mm Hemashield Platinum Graft;  Surgeon: [name], MD;  Location: [employer] [facility];  Service: Open Heart Surgery;  Laterality: N/A;  . TEE WITHOUT CARDIOVERSION N/A 04/05/2015   Procedure: TRANSESOPHAGEAL ECHOCARDIOGRAM (TEE);  Surgeon: [name], MD;  Location: [facility];  Service: Open Heart Surgery;  Laterality: N/A;   Social History   Social History Narrative   Disabled - truck driver   Lives alone or with girlfriend   1 son born 2006   Rare alcohol never smoker no drug use   Big [employer] fan      family history includes Liver disease in his cousin; Other in his mother.   Review of Systems As per HPI  Objective:   Physical Exam BP 100/64   Pulse 76   Temp (!) 97 F (36.1 C)   Ht 5' 9.5" (1.765 m)   Wt 272 lb 6.4 oz (123.6 kg)   BMI 39.65 kg/m  NAD well-developed well-nourished muscular black man Lungs cta Cor NL sounds with normal rate and irregular rhythm  Abdomen obese Alert and oriented x3

## 2019-10-15 NOTE — Patient Instructions (Signed)
Your provider has requested that you go to the basement level for lab work before leaving today. Press "B" on the elevator. The lab is located at the first door on the left as you exit the elevator.   You have been scheduled for a colonoscopy. Please follow written instructions given to you at your visit today.  Please pick up your prep supplies at the pharmacy within the next 1-3 days. If you use inhalers (even only as needed), please bring them with you on the day of your procedure.  You will be contaced by our office prior to your procedure for directions on holding your Coumadin/Warfarin.  If you do not hear from our office 1 week prior to your scheduled procedure, please call [phone] to discuss.   Due to recent COVID-19 restrictions implemented by our local and state authorities and in an effort to keep both patients and staff as safe as possible, our hospital system now requires COVID-19 testing prior to any scheduled hospital procedure. Please go to our [facility] location drive thru testing site ([address] [place], [place] [address]) on 11/01/2019 at  11:10AM. There will be multiple testing areas, the first checkpoint being for pre-procedure/surgery testing. Get into the right (yellow) lane that leads to the PAT testing team. You will not be billed at the time of testing but may receive a bill later depending on your insurance. The approximate cost of the test is $100. You must agree to quarantine from the time of your testing until the procedure date on 11/05/2019 . This should include staying at home with ONLY the people you live with. Avoid take-out, grocery store shopping or leaving the house for any non-emergent reason. Failure to have your COVID-19 test done on the date and time you have been scheduled will result in cancellation of procedure. Please call our office at [phone] if you have any questions.    I appreciate the opportunity to care for you. Silvano Rusk,  MD, [facility]

## 2019-10-15 NOTE — Telephone Encounter (Signed)
Clinical pharmacist to review coumadin 

## 2019-10-15 NOTE — Telephone Encounter (Addendum)
Called pt to move INR check to 1 week before procedure to coordinate bridge. Pt states he does not think he needs to be on warfarin long term, doesn't like coming in for INR checks and states they are a hoax. States his blood clot from earlier this year resolved.  Echo from 08/20/19 does mention "no LV thrombus identified" - initially diagnosed on 05/11/19 echo.  Will route to Dr Haroldine Laws to see if pt is able to hold warfarin for 5 days without Lovenox bridge since his thrombus has resolved. His valve is bioprosthetic so his main indication for long term anticoagulation is atrial flutter/afib with CHADS2VASc score of 3 (CHF, HTN, DM). May be a candidate to switch to [place] therapy to avoid need for INR monitoring.

## 2019-10-15 NOTE — Assessment & Plan Note (Signed)
Should be healed

## 2019-10-15 NOTE — Telephone Encounter (Signed)
[facility] Pre-operative Risk Assessment     Request for surgical clearance:     Endoscopy Procedure  What type of surgery is being performed?     Colonoscopy  When is this surgery scheduled?     11/05/2019  What type of clearance is required ?   Pharmacy  Are there any medications that need to be held prior to surgery and how long? Coumadin, 5 days  Practice name and name of physician performing surgery?      [facility]  What is your office phone and fax number?      Phone- [phone]  [phone]  Anesthesia type (None, local, MAC, general) ?       MAC

## 2019-10-15 NOTE — Telephone Encounter (Signed)
Agree. Let's switch to Eliquis if he will agree.

## 2019-10-15 NOTE — Telephone Encounter (Signed)
Patient with diagnosis of A.Flutter, LV thrombus without MI (04/2019), and aortic valve replacement  on warfarin for anticoagulation.    Procedure: COLONOSCOPY Date of procedure: 11/05/2019  CHADS2-VASc score of  3 (CHF, HTN, DM2)  CrCl > 100ML/MIN Platelet count = 323  Per office protocol, patient can hold WARFARIN for 5 days prior to procedure.    Patient WILL need bridging with Lovenox (enoxaparin) around procedure.

## 2019-10-15 NOTE — Progress Notes (Signed)
[name] 58 y.o. Oct 22, 1961 [id number]  Assessment & Plan:   Encounter Diagnoses  Name Primary?  . Iron deficiency anemia, unspecified iron deficiency anemia type Yes  . Long term current use of anticoagulant   . Atrial flutter, unspecified type ([place])   . Acute gastric ulcer with hemorrhage     The patient is improved.  It is an appropriate time to schedule colonoscopy which will be done at the hospital because of his reduced ejection fraction.  We will query cardiology regarding holding his warfarin, I explained there is a rare but real risk of a stroke off that.  It looks like his clot in the LV is gone as of September.  Recheck CBC and ferritin today.  Further plans pending clinical course.  The risks and benefits as well as alternatives of endoscopic procedure(s) have been discussed and reviewed. All questions answered. The patient agrees to proceed.     Subjective:   Chief Complaint: History of gastric ulcers iron deficiency anemia  HPI Patient today is here for follow-up.  He had an EGD with multiple bleeding gastric ulcers in the setting of anticoagulation in June.  He recovered from that he has been on PPI he was iron deficient and has been taking ferrous sulfate.  I had thought a colonoscopy would be appropriate but wanted to wait until he could more safely hold his warfarin because he had an LV thrombus this summer.  Recent echocardiogram shows that that is gone (early September).  No significant dyspnea.  He is wanting to return to the gym but is concerned about Covid so he has avoided it.  This is frustrating to him. No Known Allergies Current Meds  Medication Sig  . allopurinol (ZYLOPRIM) 100 MG tablet Take 1 tablet (100 mg total) by mouth daily.  . AMBULATORY NON FORMULARY MEDICATION Herbal Laxative tabs 1 tab by mouth twice a day  . ferrous sulfate 325 (65 FE) MG EC tablet Take 325 mg by mouth daily.  . furosemide (LASIX) 40 MG tablet Take 1 tablet  (40 mg total) by mouth daily. Take an extra 40 mg of lasix if your weight increase 3 lbs in 1 day or 5 lbs in a week or SOB  . hydrALAZINE (APRESOLINE) 25 MG tablet Take 1 tablet (25 mg total) by mouth 3 (three) times daily.  . isosorbide mononitrate (ISMO) 20 MG tablet Take 20 mg by mouth 2 (two) times daily at 10 AM and 5 PM.  . pantoprazole (PROTONIX) 40 MG tablet Take 1 tablet (40 mg total) by mouth daily.  . sacubitril-valsartan (ENTRESTO) 97-103 MG Take 1 tablet by mouth 2 (two) times daily.  [name] spironolactone (ALDACTONE) 25 MG tablet Take 1 tablet (25 mg total) by mouth daily.  [name] warfarin (COUMADIN) 5 MG tablet TAKE ONE AND ONE-HALF TO TWO TABLETS BY MOUTH AS DIRECTED BY COUMADIN CLINIC   Past Medical History:  Diagnosis Date  . Acute respiratory failure ([place]) 05/11/2019  . Aortic valve disease    a. 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 Atrial flutter ([place])   . Benign hypertensive heart and renal disease   . Cardiogenic shock ([place])   . Chronic combined systolic and diastolic CHF (congestive heart failure) ([place])   . Chronic kidney disease (CKD), stage III (moderate)   . Diabetes mellitus ([place])   . Essential hypertension 11/19/2014  . Gout   . Hyperlipidemia   . Insomnia   .  Iron deficiency anemia   . Murmur   . Noncompliance   . Obesity   . Pseudomonas pneumonia ([place]) 05/16/2019  . Pulmonary hypertension ([place])    Past Surgical History:  Procedure Laterality Date  . AORTIC VALVE REPLACEMENT N/A 04/05/2015   Procedure: AORTIC VALVE REPLACEMENT (AVR) using a 56mm Edwards Aortic Magna Ease Valve ;  Surgeon: [name], MD;  Location: [place];  Service: Open Heart Surgery;  Laterality: N/A;  . ESOPHAGOGASTRODUODENOSCOPY (EGD) WITH PROPOFOL N/A 05/19/2019   Procedure: ESOPHAGOGASTRODUODENOSCOPY (EGD) WITH PROPOFOL;  Surgeon: [name], MD;  Location: [place];  Service: Gastroenterology;  Laterality: N/A;   . HEMOSTASIS CLIP PLACEMENT  05/19/2019   Procedure: HEMOSTASIS CLIP PLACEMENT;  Surgeon: [name], MD;  Location: [facility] ENDOSCOPY;  Service: Gastroenterology;;  . LEFT AND RIGHT HEART CATHETERIZATION WITH CORONARY ANGIOGRAM N/A 11/27/2014   Procedure: LEFT AND RIGHT HEART CATHETERIZATION WITH CORONARY ANGIOGRAM;  Surgeon: [name], MD;  Location: [facility] CATH LAB;  Service: Cardiovascular;  Laterality: N/A;  . MAZE N/A 04/05/2015   Procedure: MAZE;  Surgeon: [name], MD;  Location: [place];  Service: Open Heart Surgery;  Laterality: N/A;  . REPLACEMENT ASCENDING AORTA N/A 04/05/2015   Procedure: REPLACEMENT ASCENDING AORTA with a 38mm Hemashield Platinum Graft;  Surgeon: [name], MD;  Location: [place];  Service: Open Heart Surgery;  Laterality: N/A;  . 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   Review of Systems As per HPI  Objective:   Physical Exam BP 100/64   Pulse 76   Temp (!) 97 F (36.1 C)   Ht 5' 9.5" (1.765 m)   Wt 272 lb 6.4 oz (123.6 kg)   BMI 39.65 kg/m  NAD well-developed well-nourished muscular black man Lungs cta Cor NL sounds with normal rate and irregular rhythm  Abdomen obese Alert and oriented x3

## 2019-10-16 LAB — FERRITIN: Ferritin: 52.1 ng/mL (ref 22.0–322.0)

## 2019-10-16 MED ORDER — APIXABAN 5 MG PO TABS: 5.0000 mg | ORAL_TABLET | Freq: Two times a day (BID) | ORAL | 1 refills | Status: DC

## 2019-10-16 NOTE — Telephone Encounter (Signed)
Happy to see him in clinic or do televisit to discuss with him if needed. Otherwise he can stay on warfarin.

## 2019-10-16 NOTE — Telephone Encounter (Addendum)
Called pt and spent 25 minutes discussing anticoagulation with both him and his sister. He is agreeable to changing from warfarin to Eliquis, does not wish to come in for a last INR check. Pt took warfarin this AM, advised him to stop warfarin completely and start Eliquis 5mg  twice daily with first dose this Saturday morning. He has Medicaid so a 3 month supply costs $8.95 at his pharmacy.  Advised pt he will need to stop Eliquis 2 days prior to his colonoscopy. He would like Dr Carlean Purl to call him personally to let him know that this plan is ok.

## 2019-10-16 NOTE — Telephone Encounter (Signed)
Happy to see him in the office or do a televist to discuss but otherwise would have him sty on coumadin.

## 2019-10-16 NOTE — Telephone Encounter (Signed)
Please see notes and [name] requesting you to personally call him in regards to holding the Eliquis for 2 days prior to his Hospital colonscopy on 11/05/2019. Thank you.

## 2019-10-16 NOTE — Telephone Encounter (Signed)
I spoke to him and said I was ok with cardiology plans to change to and then hold Eliquis.  He proceeded to tell me he cannot afford eliquis and wants to stay on warfarin.  I referred him back to cardiology - to discuss. I need him to be able to hold warfarin 5 d before colonoscopy.  thanks

## 2019-10-17 NOTE — Telephone Encounter (Signed)
Dr. Haroldine Laws- patient only has medicaid family planning which does not cover much. He has a medicare part D plan but has a large deducible. Patient cannot afford. This brings [place] back to the question of if he needs a bridge to hold warfarin 5 days. Can you please comment. Thank you! If you think he needs a bridge, could you please do a televist to discuss as he did not believe anything we said when we first tried to to discuss it with him. Thank you

## 2019-10-20 NOTE — Telephone Encounter (Signed)
Ok to hold warfarin without a bridge.

## 2019-10-21 NOTE — Telephone Encounter (Signed)
Patient may hold coumadin for 5 days prior to procedure. Per Dr. Haroldine Laws, patient does NOT need a lovenox bridge.

## 2019-10-21 NOTE — Telephone Encounter (Signed)
   Primary Cardiologist: [name], MD  Chart reviewed as part of pre-operative protocol coverage. Given past medical history and time since last visit, based on ACC/AHA guidelines, [name] would be at acceptable risk for the planned procedure without further cardiovascular testing.   Patient may hold coumadin for 5 days prior to procedure per pharmacy team. Per Dr. Haroldine Laws, patient does NOT need a lovenox bridge.  I will route this recommendation to the requesting party via Epic fax function and remove from pre-op pool.  Please call with questions.  Kathyrn Drown, NP 10/21/2019, 10:04 AM

## 2019-10-22 NOTE — Telephone Encounter (Signed)
I have spoken to [name] and told him about holding his warfarin for 5 days before his procedure. He tells me that he hasn't been on his warfarin for the past 5 days because he thought cardiology wanted him to take Eliquis and that they needed to help him to get it cheaper. I told him he needs to be on his blood thinner. He requested that I transfer him to cardiology to talk to his "social worker " there. I have transferred and will see what they say.

## 2019-10-23 NOTE — Telephone Encounter (Signed)
I have called the [facility] to investigate further about Eliquis billing. They did not have Medicaid insurance on file. After I provided them with ID on file in EPIC, they said they are getting an error message saying "patient is not covered." They advised to have pt call Medicaid to inquire about coverage.  I then called patient to ask him if he had any more information to provide about Medicaid. He was very upset that he was being called again about this and said his social worker was dealing with this and to talk to them. I recommended for patient to call Medicaid for clarification about coverage.   When asking about his warfarin therapy, patient would not answer the direct yes/no question if he had restarted his warfarin while we are waiting to resolve the problems around starting Eliquis. He stated "you should already know." He did say he was aware of the instructions given to him on the paper around holding his warfarin around his upcoming procedure. He then stated he was tired of receiving multiple phone calls about this matter and was "done talking" to me about this and hung up the phone.

## 2019-10-27 ENCOUNTER — Telehealth (HOSPITAL_COMMUNITY): Payer: Self-pay | Admitting: Licensed Clinical Social Worker

## 2019-10-27 NOTE — Telephone Encounter (Signed)
CSW consulted to speak with pt regarding medical cost concerns.  Pt supposed to be on Eliquis but its too expensive.  CSW confirmed with patient advocate that pt has no Part D medicare coverage so should be eligible for [place] to get Eliquis at no cost.  CSW spoke with pt and mailed application.  CSW will continue to follow and assist as needed  Jorge Ny, [facility] Desk#: [phone] Cell#: [phone]

## 2019-10-28 ENCOUNTER — Other Ambulatory Visit: Payer: Self-pay | Admitting: Cardiology

## 2019-10-31 NOTE — Telephone Encounter (Signed)
I spoke with [name] and told him to hold his warfarin for the next 5 days. He verbalized understanding. And he is checking his mail for the information that [name] has mailed him. We confirmed the time/date for his covid screen.

## 2019-10-31 NOTE — Telephone Encounter (Signed)
   KP   [name] Male, 58 y.o., [birth year] MRN:  [id number] Phone:  [phone] (M) PCP:  [name], MD Coverage:  Medicare/Medicare Part A And B Next Appt With [facility]-SCREENING 11/01/2019 at 11:10 AM  Message Received: Wilburn Mylar Message Contents  Jorge Ny, LCSW  [place], Markiah Janeway E, CMA        I doubt he would have even received the assistance application for eliquis at this time as I just mailed it out- if he can bring it back with him when he gets his procedure or bring it in before that time as I instructed him to we can submit that same day and usually hear back in 1-2 weeks- i'm sure we could supply him some samples to hold him over while he awaits a determination   Previous Messages  ----- Message -----  From: [place], Vyncent Overby E, CMA  Sent: 10/30/2019  2:22 PM EST  To: Jorge Ny, LCSW   Hello, any luck with communications with this gentleman in regards to his blood thinner. His procedure is 11/05/2019 so if he's still on his warfarin I need him to start holding it tomorrow.

## 2019-11-01 ENCOUNTER — Other Ambulatory Visit (HOSPITAL_COMMUNITY)
Admission: RE | Admit: 2019-11-01 | Discharge: 2019-11-01 | Disposition: A | Discharge status: Home / Self Care | Payer: Medicare Other | Source: Ambulatory Visit | Attending: Internal Medicine | Admitting: Internal Medicine

## 2019-11-01 DIAGNOSIS — Z20828 Contact with and (suspected) exposure to other viral communicable diseases: Secondary | ICD-10-CM | POA: Insufficient documentation

## 2019-11-01 DIAGNOSIS — Z01812 Encounter for preprocedural laboratory examination: Secondary | ICD-10-CM | POA: Insufficient documentation

## 2019-11-02 LAB — NOVEL CORONAVIRUS, NAA (HOSP ORDER, SEND-OUT TO REF LAB; TAT 18-24 HRS): SARS-CoV-2, NAA: NOT DETECTED

## 2019-11-04 ENCOUNTER — Encounter (HOSPITAL_COMMUNITY): Payer: Self-pay | Admitting: *Deleted

## 2019-11-05 ENCOUNTER — Ambulatory Visit (HOSPITAL_COMMUNITY): Payer: Medicare Other | Admitting: Anesthesiology

## 2019-11-05 ENCOUNTER — Ambulatory Visit (HOSPITAL_COMMUNITY)
Admission: RE | Admit: 2019-11-05 | Discharge: 2019-11-05 | Disposition: A | Discharge status: Home / Self Care | Payer: Medicare Other | Attending: Internal Medicine | Admitting: Internal Medicine

## 2019-11-05 ENCOUNTER — Encounter (HOSPITAL_COMMUNITY)
Admission: RE | Disposition: A | Discharge status: Home / Self Care | Payer: Self-pay | Source: Home / Self Care | Attending: Internal Medicine

## 2019-11-05 ENCOUNTER — Encounter (HOSPITAL_COMMUNITY): Payer: Self-pay | Admitting: Certified Registered Nurse Anesthetist

## 2019-11-05 ENCOUNTER — Other Ambulatory Visit: Payer: Self-pay

## 2019-11-05 DIAGNOSIS — Z953 Presence of xenogenic heart valve: Secondary | ICD-10-CM | POA: Insufficient documentation

## 2019-11-05 DIAGNOSIS — Z79899 Other long term (current) drug therapy: Secondary | ICD-10-CM | POA: Insufficient documentation

## 2019-11-05 DIAGNOSIS — E1122 Type 2 diabetes mellitus with diabetic chronic kidney disease: Secondary | ICD-10-CM | POA: Diagnosis not present

## 2019-11-05 DIAGNOSIS — D122 Benign neoplasm of ascending colon: Secondary | ICD-10-CM | POA: Diagnosis present

## 2019-11-05 DIAGNOSIS — I272 Pulmonary hypertension, unspecified: Secondary | ICD-10-CM | POA: Diagnosis not present

## 2019-11-05 DIAGNOSIS — I4891 Unspecified atrial fibrillation: Secondary | ICD-10-CM | POA: Insufficient documentation

## 2019-11-05 DIAGNOSIS — D123 Benign neoplasm of transverse colon: Secondary | ICD-10-CM | POA: Diagnosis not present

## 2019-11-05 DIAGNOSIS — D509 Iron deficiency anemia, unspecified: Secondary | ICD-10-CM | POA: Diagnosis present

## 2019-11-05 DIAGNOSIS — K648 Other hemorrhoids: Secondary | ICD-10-CM | POA: Diagnosis not present

## 2019-11-05 DIAGNOSIS — I4892 Unspecified atrial flutter: Secondary | ICD-10-CM | POA: Diagnosis not present

## 2019-11-05 DIAGNOSIS — Z6837 Body mass index (BMI) 37.0-37.9, adult: Secondary | ICD-10-CM | POA: Diagnosis not present

## 2019-11-05 DIAGNOSIS — I5042 Chronic combined systolic (congestive) and diastolic (congestive) heart failure: Secondary | ICD-10-CM | POA: Diagnosis not present

## 2019-11-05 DIAGNOSIS — N183 Chronic kidney disease, stage 3 unspecified: Secondary | ICD-10-CM | POA: Insufficient documentation

## 2019-11-05 DIAGNOSIS — Z7901 Long term (current) use of anticoagulants: Secondary | ICD-10-CM | POA: Insufficient documentation

## 2019-11-05 DIAGNOSIS — Z8711 Personal history of peptic ulcer disease: Secondary | ICD-10-CM | POA: Insufficient documentation

## 2019-11-05 DIAGNOSIS — I130 Hypertensive heart and chronic kidney disease with heart failure and stage 1 through stage 4 chronic kidney disease, or unspecified chronic kidney disease: Secondary | ICD-10-CM | POA: Diagnosis not present

## 2019-11-05 DIAGNOSIS — M109 Gout, unspecified: Secondary | ICD-10-CM | POA: Insufficient documentation

## 2019-11-05 HISTORY — PX: COLONOSCOPY WITH PROPOFOL: SHX5780

## 2019-11-05 HISTORY — PX: POLYPECTOMY: SHX5525

## 2019-11-05 HISTORY — DX: Acute gastric ulcer with hemorrhage: K25.0

## 2019-11-05 SURGERY — COLONOSCOPY WITH PROPOFOL
Anesthesia: Monitor Anesthesia Care

## 2019-11-05 MED ORDER — LACTATED RINGERS IV SOLN
INTRAVENOUS | Status: DC
  Administered 2019-11-05: 1000 mL via INTRAVENOUS

## 2019-11-05 MED ORDER — PROPOFOL 10 MG/ML IV BOLUS
INTRAVENOUS | Status: DC | PRN
  Administered 2019-11-05: 70 mg via INTRAVENOUS

## 2019-11-05 MED ORDER — LIDOCAINE HCL (CARDIAC) PF 100 MG/5ML IV SOSY
PREFILLED_SYRINGE | INTRAVENOUS | Status: DC | PRN
  Administered 2019-11-05: 100 mg via INTRAVENOUS

## 2019-11-05 MED ORDER — PROPOFOL 500 MG/50ML IV EMUL
INTRAVENOUS | Status: DC | PRN
  Administered 2019-11-05: 150 ug/kg/min via INTRAVENOUS

## 2019-11-05 MED ORDER — SODIUM CHLORIDE 0.9 % IV SOLN: INTRAVENOUS | Status: DC

## 2019-11-05 SURGICAL SUPPLY — 22 items

## 2019-11-05 NOTE — Discharge Instructions (Signed)
I found and removed 2 tiny polyps - nothing bad going on.  I will let you know pathology results and when to have another routine colonoscopy by mail and/or My Chart. I appreciate the opportunity to care for you. [name], MD, FACG  YOU HAD AN ENDOSCOPIC PROCEDURE TODAY: Refer to the procedure report and other information in the discharge instructions given to you for any specific questions about what was found during the examination. If this information does not answer your questions, please call Dr. [name] office at [phone] to clarify.   YOU SHOULD EXPECT: Some feelings of bloating in the abdomen. Passage of more gas than usual. Walking can help get rid of the air that was put into your GI tract during the procedure and reduce the bloating. If you had a lower endoscopy (such as a colonoscopy or flexible sigmoidoscopy) you may notice spotting of blood in your stool or on the toilet paper. Some abdominal soreness may be present for a day or two, also.  DIET: Your first meal following the procedure should be a light meal and then it is ok to progress to your normal diet. A half-sandwich or bowl of soup is an example of a good first meal. Heavy or fried foods are harder to digest and may make you feel nauseous or bloated. Drink plenty of fluids but you should avoid alcoholic beverages for 24 hours.   ACTIVITY: Your care partner should take you home directly after the procedure. You should plan to take it easy, moving slowly for the rest of the day. You can resume normal activity the day after the procedure however YOU SHOULD NOT DRIVE, use power tools, machinery or perform tasks that involve climbing or major physical exertion for 24 hours (because of the sedation medicines used during the test).   SYMPTOMS TO REPORT IMMEDIATELY: A gastroenterologist can be reached at any hour. Please call [phone]  for any of the following symptoms:  Following lower endoscopy (colonoscopy, flexible  sigmoidoscopy) Excessive amounts of blood in the stool  Significant tenderness, worsening of abdominal pains  Swelling of the abdomen that is new, acute  Fever of 100 or higher  Following upper endoscopy (EGD, EUS, ERCP, esophageal dilation) Vomiting of blood or coffee ground material  New, significant abdominal pain  New, significant chest pain or pain under the shoulder blades  Painful or persistently difficult swallowing  New shortness of breath  Black, tarry-looking or red, bloody stools  FOLLOW UP:  If any biopsies were taken you will be contacted by phone or by letter within the next 1-3 weeks. Call [phone]  if you have not heard about the biopsies in 3 weeks.  Please also call with any specific questions about appointments or follow up tests.

## 2019-11-05 NOTE — Interval H&P Note (Signed)
History and Physical Interval Note:  11/05/2019 12:43 PM  [name]  has presented today for surgery, with the diagnosis of iron def anemia.  The various methods of treatment have been discussed with the patient and family. After consideration of risks, benefits and other options for treatment, the patient has consented to  Procedure(s): COLONOSCOPY WITH PROPOFOL (N/A) as a surgical intervention.  The patient's history has been reviewed, patient examined, no change in status, stable for surgery.  I have reviewed the patient's chart and labs.  Questions were answered to the patient's satisfaction.     Silvano Rusk

## 2019-11-05 NOTE — Op Note (Signed)
[facility] Patient Name: [name] Procedure Date: 11/05/2019 MRN: [id number] Attending MD: [name] , MD Date of Birth: 30-Aug-1961 CSN: [id number] Age: 58 Admit Type: Outpatient Procedure:                Colonoscopy Indications:              Iron deficiency anemia Providers:                [name], MD, Cleda Daub, RN, Cherylynn Ridges, Technician, Stephanie [place] CRNA Referring MD:              Medicines:                Propofol per Anesthesia, Monitored Anesthesia Care Complications:            No immediate complications. Estimated Blood Loss:     Estimated blood loss was minimal. Procedure:                Pre-Anesthesia Assessment:                           - Prior to the procedure, a History and Physical                            was performed, and patient medications and                            allergies were reviewed. The patient's tolerance of                            previous anesthesia was also reviewed. The risks                            and benefits of the procedure and the sedation                            options and risks were discussed with the patient.                            All questions were answered, and informed consent                            was obtained. Prior Anticoagulants: The patient                            last took Coumadin (warfarin) 5 days prior to the                            procedure. ASA Grade Assessment: IV - A patient                            with severe systemic disease that is a constant  threat to life. After reviewing the risks and                            benefits, the patient was deemed in satisfactory                            condition to undergo the procedure.                           After obtaining informed consent, the colonoscope                            was passed under direct vision. Throughout the                             procedure, the patient's blood pressure, pulse, and                            oxygen saturations were monitored continuously. The                            [id number]) Olympus colonoscope was                            introduced through the anus and advanced to the the                            cecum, identified by appendiceal orifice and                            ileocecal valve. The colonoscopy was performed                            without difficulty. The patient tolerated the                            procedure well. The quality of the bowel                            preparation was excellent. The bowel preparation                            used was Miralax via split dose instruction. The                            ileocecal valve, appendiceal orifice, and rectum                            were photographed. Scope In: 1:54:05 PM Scope Out: 2:09:04 PM Scope Withdrawal Time: 0 hours 12 minutes 17 seconds  Total Procedure Duration: 0 hours 14 minutes 59 seconds  Findings:      The perianal and digital rectal examinations were normal. Pertinent       negatives include normal prostate (size, shape, and consistency).  Two sessile polyps were found in the transverse colon and ascending       colon. The polyps were 1 to 2 mm in size. These polyps were removed with       a cold biopsy forceps. Resection and retrieval were complete.       Verification of patient identification for the specimen was done.       Estimated blood loss was minimal.      Internal hemorrhoids were found.      The exam was otherwise without abnormality on direct and retroflexion       views. Impression:               - Two 1 to 2 mm polyps in the transverse colon and                            in the ascending colon, removed with a cold biopsy                            forceps. Resected and retrieved.                           - Internal hemorrhoids.                           - The  examination was otherwise normal on direct                            and retroflexion views. Did have a medium lipoma                            ascedning colon and signs of melanosis Moderate Sedation:      Not Applicable - Patient had care per Anesthesia. Recommendation:           - Patient has a contact number available for                            emergencies. The signs and symptoms of potential                            delayed complications were discussed with the                            patient. Return to normal activities tomorrow.                            Written discharge instructions were provided to the                            patient.                           - Resume previous diet.                           - Continue present medications.                           -  Resume Coumadin (warfarin) at prior dose today.                           - Repeat colonoscopy is recommended. The                            colonoscopy date will be determined after pathology                            results from today's exam become available for                            review.                           - will contact with results                           Suspect fe defic was from gastric                            ulcers/multifactorial                           it has resolved Procedure Code(s):        --- Professional ---                           [phone], Colonoscopy, flexible; with biopsy, single                            or multiple Diagnosis Code(s):        --- Professional ---                           K63.5, Polyp of colon                           K64.8, Other hemorrhoids                           D50.9, Iron deficiency anemia, unspecified CPT copyright 2019 American Medical Association. All rights reserved. The codes documented in this report are preliminary and upon coder review may  be revised to meet current compliance requirements. [name],  MD 11/05/2019 2:29:46 PM This report has been signed electronically. Number of Addenda: 0

## 2019-11-05 NOTE — Anesthesia Preprocedure Evaluation (Addendum)
Anesthesia Evaluation  Patient identified by MRN, date of birth, ID band Patient awake    Reviewed: Allergy & Precautions, NPO status , Patient's Chart, lab work & pertinent test results  History of Anesthesia Complications Negative for: history of anesthetic complications  Airway Mallampati: II  TM Distance: >3 FB Neck ROM: Full    Dental  (+) Dental Advisory Given, Teeth Intact   Pulmonary neg pulmonary ROS,    Pulmonary exam normal        Cardiovascular hypertension, Pt. on medications (-) angina+CHF  Normal cardiovascular exam+ dysrhythmias Atrial Fibrillation + Valvular Problems/Murmurs    S/p AVR  '20 TTE - EF 20-25%. Moderate concentric left ventricular hypertrophy. Abnormal septal motion consistent with left bundle branch block. Left ventricular diffuse hypokinesis. The right ventricle has moderately reduced systolic function. LA was moderately dilated. Trivial pericardial effusion is present. 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 There is mild central AI. When compared to the prior study: Apical LV thrombus has resolved compared with prior study from 05/11/2019    Neuro/Psych negative neurological ROS  negative psych ROS   GI/Hepatic Neg liver ROS, PUD,   Endo/Other  diabetesMorbid obesity  Renal/GU CRFRenal disease     Musculoskeletal negative musculoskeletal ROS (+)   Abdominal   Peds  Hematology negative hematology ROS (+)   Anesthesia Other Findings   Reproductive/Obstetrics                            Anesthesia Physical Anesthesia Plan  ASA: IV  Anesthesia Plan: MAC   Post-op Pain Management:    Induction: Intravenous  PONV Risk Score and Plan: 1 and Propofol infusion and Treatment may vary due to age or medical condition  Airway Management Planned: Natural Airway and Simple Face Mask  Additional Equipment: None  Intra-op Plan:    Post-operative Plan:   Informed Consent: I have reviewed the patients History and Physical, chart, labs and discussed the procedure including the risks, benefits and alternatives for the proposed anesthesia with the patient or authorized representative who has indicated his/her understanding and acceptance.       Plan Discussed with: CRNA and Anesthesiologist  Anesthesia Plan Comments:        Anesthesia Quick Evaluation

## 2019-11-05 NOTE — Transfer of Care (Signed)
Immediate Anesthesia Transfer of Care Note  Patient: ZYMEIR SALMINEN  Procedure(s) Performed: COLONOSCOPY WITH PROPOFOL (N/A ) POLYPECTOMY  Patient Location: PACU and Endoscopy Unit  Anesthesia Type:MAC  Level of Consciousness: awake and drowsy  Airway & Oxygen Therapy: Patient Spontanous Breathing and Patient connected to face mask oxygen  Post-op Assessment: Report given to RN and Post -op Vital signs reviewed and stable  Post vital signs: Reviewed and stable  Last Vitals:  Vitals Value Taken Time  BP    Temp    Pulse 60 11/05/19 1417  Resp 14 11/05/19 1417  SpO2 97 % 11/05/19 1417  Vitals shown include unvalidated device data.  Last Pain:  Vitals:   11/05/19 1235  TempSrc: Oral  PainSc: 0-No pain         Complications: No apparent anesthesia complications

## 2019-11-05 NOTE — Anesthesia Postprocedure Evaluation (Signed)
Anesthesia Post Note  Patient: [name]  Procedure(s) Performed: COLONOSCOPY WITH PROPOFOL (N/A ) POLYPECTOMY     Patient location during evaluation: PACU Anesthesia Type: MAC Level of consciousness: awake and alert Pain management: pain level controlled Vital Signs Assessment: post-procedure vital signs reviewed and stable Respiratory status: spontaneous breathing, nonlabored ventilation and respiratory function stable Cardiovascular status: stable and blood pressure returned to baseline Anesthetic complications: no    Last Vitals:  Vitals:   11/05/19 1441 11/05/19 1450  BP: (!) 107/92   Pulse:  91  Resp: 19 (!) 21  Temp:    SpO2: 96% 96%    Last Pain:  Vitals:   11/05/19 1441  TempSrc:   PainSc: 0-No pain                 Audry Pili

## 2019-11-06 ENCOUNTER — Telehealth (HOSPITAL_COMMUNITY): Payer: Self-pay | Admitting: Licensed Clinical Social Worker

## 2019-11-06 ENCOUNTER — Encounter (HOSPITAL_COMMUNITY): Payer: Self-pay | Admitting: Internal Medicine

## 2019-11-06 ENCOUNTER — Other Ambulatory Visit (HOSPITAL_COMMUNITY): Payer: Self-pay

## 2019-11-06 LAB — SURGICAL PATHOLOGY

## 2019-11-06 MED ORDER — APIXABAN 5 MG PO TABS: 5.0000 mg | ORAL_TABLET | Freq: Two times a day (BID) | ORAL | 3 refills | Status: DC

## 2019-11-06 NOTE — Telephone Encounter (Signed)
Pt called CSW to come in and complete [employer] application for Eliquis assistance.  CSW assisted pt in completing application and then had MD complete their portion.  Application faxed in for review.  If pt is approved he will need to be transitioned off of coumadin and onto Elquis.  CSW will continue to follow and assist as needed  Jorge Ny, [place] [facility] Desk#: [phone] Cell#: [phone]

## 2019-11-06 NOTE — Telephone Encounter (Signed)
Goal for patient to stop Coumadin and start Eliquis. SW [place] spoke with Dr Claretta Fraise as she is applying for a grant for him.  Script printed for [place] to fax for grant.  If approved, pt will be notified to stop coumadin and start Eliquis.

## 2019-11-11 ENCOUNTER — Encounter: Payer: Self-pay | Admitting: Internal Medicine

## 2019-11-11 DIAGNOSIS — Z860100 Personal history of colon polyps, unspecified: Secondary | ICD-10-CM

## 2019-11-11 HISTORY — DX: Personal history of colon polyps, unspecified: Z86.0100

## 2019-11-11 HISTORY — DX: Personal history of colonic polyps: Z86.010

## 2019-11-11 NOTE — Progress Notes (Signed)
2 diminutive ssp Recall 2027

## 2019-11-21 ENCOUNTER — Ambulatory Visit (HOSPITAL_COMMUNITY)
Admission: RE | Admit: 2019-11-21 | Discharge: 2019-11-21 | Disposition: A | Discharge status: Home / Self Care | Payer: Medicare Other | Source: Ambulatory Visit | Attending: Internal Medicine | Admitting: Internal Medicine

## 2019-11-21 ENCOUNTER — Other Ambulatory Visit: Payer: Self-pay

## 2019-11-21 DIAGNOSIS — I5022 Chronic systolic (congestive) heart failure: Secondary | ICD-10-CM

## 2019-11-21 DIAGNOSIS — N182 Chronic kidney disease, stage 2 (mild): Secondary | ICD-10-CM

## 2019-11-21 DIAGNOSIS — I240 Acute coronary thrombosis not resulting in myocardial infarction: Secondary | ICD-10-CM

## 2019-11-21 DIAGNOSIS — Z952 Presence of prosthetic heart valve: Secondary | ICD-10-CM

## 2019-11-21 DIAGNOSIS — I4819 Other persistent atrial fibrillation: Secondary | ICD-10-CM

## 2019-11-21 MED ORDER — ISOSORBIDE MONONITRATE ER 60 MG PO TB24: 60.0000 mg | ORAL_TABLET | Freq: Every day | ORAL | 2 refills | Status: DC

## 2019-11-21 MED ORDER — SPIRONOLACTONE 25 MG PO TABS: 25.0000 mg | ORAL_TABLET | Freq: Every day | ORAL | 3 refills | Status: DC

## 2019-11-21 MED ORDER — FUROSEMIDE 40 MG PO TABS: 40.0000 mg | ORAL_TABLET | Freq: Every day | ORAL | 3 refills | Status: DC

## 2019-11-21 MED ORDER — HYDRALAZINE HCL 25 MG PO TABS: ORAL_TABLET | ORAL | 3 refills | Status: DC

## 2019-11-21 NOTE — Patient Instructions (Signed)
Medications were refilled and returned to your pharmacy electronically  Your physician recommends that you schedule a follow-up appointment in: 6 months with Dr Haroldine Laws and echo -if we have not called you by May 2021, please give our office a call to schedule your follow up  Your physician has requested that you have an echocardiogram. Echocardiography is a painless test that uses sound waves to create images of your heart. It provides your doctor with information about the size and shape of your heart and how well your heart's chambers and valves are working. This procedure takes approximately one hour. There are no restrictions for this procedure.  Do the following things EVERYDAY: 1) Weigh yourself in the morning before breakfast. Write it down and keep it in a log. 2) Take your medicines as prescribed 3) Eat low salt foods-Limit salt (sodium) to 2000 mg per day.  4) Stay as active as you can everyday 5) Limit all fluids for the day to less than 2 liters

## 2019-11-21 NOTE — Addendum Note (Signed)
Encounter addended by: Kerry Dory, CMA on: 11/21/2019 11:51 AM  Actions taken: Pharmacy for encounter modified, Order list changed, Diagnosis association updated, Clinical Note Signed

## 2019-11-21 NOTE — Progress Notes (Signed)
Heart Failure TeleHealth Note  Due to national recommendations of social distancing due to [place] 19, Audio/video telehealth visit is felt to be most appropriate for this patient at this time.  See MyChart message from today for patient consent regarding telehealth for [name].  Date:  11/21/2019   ID:  [name], DOB December 09, 1961, MRN [id number]  Location: Home  Provider location: [facility] Type of Visit: Established patient  PCP:  [name], MD  Cardiologist:  [name], MD Primary HF: [name]  Chief Complaint: Heart Failure follow-up   History of Present Illness:  58 y/o male with h/o AVR in 4/16, pulmonary HTN, CKD 3, gout.  Admitted in 5/24-05/23/19 with respiratory failure due to HF exacerbation and pseudomonas PNA. He was treated with antibiotics and diuretics. Echo with EF 20-25% with apical clot. Also was in/out of AF/NSR. When in NSR had episodedsof heat block  He was noted to have an LV apical thrombus and started on anticoagulation, but he developed a GI bleed due to suspected stress ulcers.   Recently saw Dr. [name]. Remains in AF. No further pauses. We discussed and PM deferred.   Feels good. Starting to workout with Risk manager. No CP, SOB, orthopnea or PND. Doing cardio and weights.   Echo 9/20 EF still 25% AVR stable. Moderate RV HK Personally reviewed  He presents via Engineer, civil (consulting) for a telehealth visit today due to COVID-19 crisis. Says he is doing fine. Breathing fine. Continue to lift weights, do situps and walking. No edema, orthopnea or PND. Weight stable at 162. Has not started Eliquis - working with SW. Still taking warfarin. No bleeding.   [name] denies symptoms worrisome for COVID 19.   Past Medical History:  Diagnosis Date  . Acute gastric ulcer with hemorrhage   . Acute respiratory failure ([place]) 05/11/2019  . Aortic valve disease    a. severe AI/severe  AS/bicuspid AV s/p bioprosthetic aortic valve with replacement of ascending aorta 2016  . Atrial flutter ([place])   . Benign hypertensive heart and renal disease   . Cardiogenic shock ([place])   . Chronic combined systolic and diastolic CHF (congestive heart failure) ([place])   . Chronic kidney disease (CKD), stage III (moderate)   . Diabetes mellitus ([place])   . Essential hypertension 11/19/2014  . Gout   . Hx of colonic polyps 11/11/2019  . Hyperlipidemia   . Insomnia   . Iron deficiency anemia   . Murmur   . Noncompliance   . Obesity   . Pseudomonas pneumonia ([place]) 05/16/2019  . Pulmonary hypertension ([place])    Past Surgical History:  Procedure Laterality Date  . AORTIC VALVE REPLACEMENT N/A 04/05/2015   Procedure: AORTIC VALVE REPLACEMENT (AVR) using a 40mm Edwards Aortic Magna Ease Valve ;  Surgeon: [name], MD;  Location: [facility];  Service: Open Heart Surgery;  Laterality: N/A;  . COLONOSCOPY WITH PROPOFOL N/A 11/05/2019   Procedure: COLONOSCOPY WITH PROPOFOL;  Surgeon: [name], MD;  Location: [facility] ENDOSCOPY;  Service: Endoscopy;  Laterality: N/A;  . ESOPHAGOGASTRODUODENOSCOPY (EGD) WITH PROPOFOL N/A 05/19/2019   Procedure: ESOPHAGOGASTRODUODENOSCOPY (EGD) WITH PROPOFOL;  Surgeon: [name], MD;  Location: [place];  Service: Gastroenterology;  Laterality: N/A;  . HEMOSTASIS CLIP PLACEMENT  05/19/2019   Procedure: HEMOSTASIS CLIP PLACEMENT;  Surgeon: [name], MD;  Location: [facility] ENDOSCOPY;  Service: Gastroenterology;;  . LEFT AND RIGHT HEART CATHETERIZATION WITH CORONARY ANGIOGRAM N/A 11/27/2014  Procedure: LEFT AND RIGHT HEART CATHETERIZATION WITH CORONARY ANGIOGRAM;  Surgeon: [name], MD;  Location: [facility] CATH LAB;  Service: Cardiovascular;  Laterality: N/A;  . MAZE N/A 04/05/2015   Procedure: MAZE;  Surgeon: [name], MD;  Location: [place];  Service: Open Heart Surgery;  Laterality: N/A;  . POLYPECTOMY  11/05/2019   Procedure: POLYPECTOMY;  Surgeon:  [name], MD;  Location: [facility] ENDOSCOPY;  Service: Endoscopy;;  . REPLACEMENT ASCENDING AORTA N/A 04/05/2015   Procedure: REPLACEMENT ASCENDING AORTA with a 24mm Hemashield Platinum Graft;  Surgeon: [name], MD;  Location: [place];  Service: Open Heart Surgery;  Laterality: N/A;  . TEE WITHOUT CARDIOVERSION N/A 04/05/2015   Procedure: TRANSESOPHAGEAL ECHOCARDIOGRAM (TEE);  Surgeon: [name], MD;  Location: [place];  Service: Open Heart Surgery;  Laterality: N/A;     Current Outpatient Medications  Medication Sig Dispense Refill  . allopurinol (ZYLOPRIM) 100 MG tablet Take 1 tablet (100 mg total) by mouth daily. (Patient taking differently: Take 100 mg by mouth daily as needed (gout). ) 90 tablet 3  . AMBULATORY NON FORMULARY MEDICATION Take 1 tablet by mouth 2 (two) times daily. Swiss Kriss Herbal Laxative tabs    . amoxicillin (AMOXIL) 500 MG tablet Take 4 tablets (2,000 mg total) by mouth once as needed for up to 1 dose (take 1 hour prior to dental appointment). (Patient taking differently: Take 2,000 mg by mouth See admin instructions. Take 2000 mg 1 hour prior to dental appointment) 4 tablet 3  . apixaban (ELIQUIS) 5 MG TABS tablet Take 1 tablet (5 mg total) by mouth 2 (two) times daily. 180 tablet 3  . ferrous sulfate 325 (65 FE) MG EC tablet Take 325 mg by mouth daily.    . furosemide (LASIX) 40 MG tablet Take 1 tablet (40 mg total) by mouth daily. Take an extra 40 mg of lasix if your weight increase 3 lbs in 1 day or 5 lbs in a week or SOB 90 tablet 3  . hydrALAZINE (APRESOLINE) 25 MG tablet Take 1 tablet (25 mg total) by mouth 3 (three) times daily. (Patient taking differently: Take 25-50 mg by mouth See admin instructions. Take 50 mg in the morning and 25 mg in the afternoon) 180 tablet 3  . isosorbide mononitrate (IMDUR) 60 MG 24 hr tablet Take 60 mg by mouth daily.    . Multiple Vitamins-Minerals (EMERGEN-C IMMUNE) PACK Take 1 packet by mouth daily.    .  Naphazoline-Glycerin (REDNESS RELIEF OP) Place 1 drop into both eyes daily as needed (redness).    . pantoprazole (PROTONIX) 40 MG tablet Take 1 tablet (40 mg total) by mouth daily. 90 tablet 3  . sacubitril-valsartan (ENTRESTO) 97-103 MG Take 1 tablet by mouth 2 (two) times daily. 180 tablet 3  . spironolactone (ALDACTONE) 25 MG tablet Take 1 tablet (25 mg total) by mouth daily. 90 tablet 3  . warfarin (COUMADIN) 5 MG tablet TAKE 1 AND 1/2 TO 2 TABLETS BY MOUTH AS DIRECTED BY COUMADIN CLINIC Take 2 tablets on 11/18 and 11/19 2020 and then resume regular schedule 60 tablet 1   No current facility-administered medications for this encounter.     Allergies:   Patient has no known allergies.   Social History:  The patient  reports that he has never smoked. He has never used smokeless tobacco. He reports current alcohol use. He reports that he does not use drugs.   Family History:  The patient's family history  includes Liver disease in his cousin; Other in his mother.   ROS:  Please see the history of present illness.   All other systems are personally reviewed and negative.   Exam:  (Video/Tele Health Call; Exam is subjective and or/visual.) General:  Speaks in full sentences. No resp difficulty. Lungs: Normal respiratory effort with conversation.  Abdomen: Non-distended per patient report Extremities: Pt denies edema. Neuro: Alert & oriented x 3.   Recent Labs: 05/15/2019: ALT 47 05/23/2019: Magnesium 2.0 07/11/2019: B Natriuretic Peptide 789.9; BUN 10; Creatinine, Ser 1.18; Potassium 4.1; Sodium 137 10/15/2019: Hemoglobin 15.5; Platelets 200.0  Personally reviewed   Wt Readings from Last 3 Encounters:  11/05/19 118.8 kg (262 lb)  10/15/19 123.6 kg (272 lb 6.4 oz)  08/20/19 120.4 kg (265 lb 6.4 oz)      ASSESSMENT AND PLAN:  1. Chronic systolic HF with biventricular failure - Echo 05/11/19 EF 20-25% with moderately decreased RV function  - Echo 12/19 EF 50% - Due to NICM  (coronaries normal in 2016). Suspect due to probable tachy-induced CM (Was in AFL in 140s on admit) - Echo 9/20 EF still 25% Personally reviewed - Volume statusstable - Stable NYHA I -ContinueEntresto 97/103 - Continue hydralazine 25 tid Imdur - Continue Spiro 12.5 bid - Lasix 40 daily - Consider Farxiga in future - No b-blocker with sinus and AV node dysfunction - If EF not improving will need to reconsider biv ICD. Dr. [name] following  2. Persitent AFL/ AFIB with tachy-brady syndrome - has failed previous Maze. Not felt to be candidate for ablation - suspect this may be contributing to CM but rates no longer fast  - previous ECGs had  AV dissociation with accelerated junctional outpacing his sinus - Now in AF with rate control  - Has been seen by EP. No need for pacer just yet but may need to consider to permit b-blocker. Followed by Dr. [name] - Continue warfarin  3. CKD 3 - baseline creatinine 1.8 - last BMET in with creatinine 1.2  4. Bioprosthetic AVR in 4/16 - stable on echo - Reminded on SBE prophylaxis  5. Pulmonary HTN - Likely WHO group 2&3  6. LV apical thrombus - on warfarin   7. GIB - had duodenal ulcers.  - recent colonoscopy 11/05/19 with Dr. Carlean Purl. 2 small polyps  Signed, Glori Bickers, MD  11/21/2019 11:05 AM  Advanced Heart Failure [place] [address] [phone] (office) [phone] (fax)

## 2019-11-28 ENCOUNTER — Telehealth (HOSPITAL_COMMUNITY): Payer: Self-pay | Admitting: Pharmacist

## 2019-11-28 NOTE — Telephone Encounter (Signed)
Advanced Heart Failure Patient Advocate Encounter   Patient was denied for manufacturer's assistance for Eliquis. Per [employer], he would need to spend an additional $554 on prescription drugs this year before approval. Has Medicare part D plan [facility]).   Given that we are unable to develop a long-term anticoagulation plan with any DOAC because of his insurance (both Eliquis and Xarelto $468/month because he has not met his deductible), he should continue on warfarin. Reasonable to re-evaluate if he obtains new prescription insurance in 2021 as compliance with warfarin has been an issue.Audry Riles, PharmD, BCPS, BCCP, CPP Heart Failure Clinic Pharmacist [phone]'

## 2019-12-01 ENCOUNTER — Telehealth: Payer: Self-pay

## 2019-12-01 NOTE — Telephone Encounter (Signed)
Noted Eliquis patient assistance denial - called pt to schedule INR check since continuing with warfarin therapy. Pt says he has been taking his warfarin "for a while now" but he says he's unable to come into clinic until next week to get INR checked. Scheduled appt for 12/22. Instructed pt that if he's interested, he could look into obtaining a new prescription insurance plan that would better cover Eliquis. Pt verbalized understanding and will contact us or CSW if he decides to switch plans.

## 2019-12-06 ENCOUNTER — Other Ambulatory Visit: Payer: Self-pay | Admitting: Cardiology

## 2019-12-08 NOTE — Telephone Encounter (Signed)
t is overdue for follow-up last seen 10/07/19.  Pt has appt on 12/09/19 will refill rx at appt. Note placed on appt refill in box.

## 2019-12-09 NOTE — Telephone Encounter (Signed)
Pt called this AM and rescheduled appt. He stated he needs to come to a an appt in the New Year. Advised of risks since he has not been seen since 10/07/2019 and he stated I just need to reschedule. Advised of risks of CVA, clots, bleeding and he stated okay reschedule me to next year. Appt rescheduled at patients request.

## 2019-12-23 ENCOUNTER — Other Ambulatory Visit: Payer: Self-pay

## 2019-12-23 ENCOUNTER — Ambulatory Visit (INDEPENDENT_AMBULATORY_CARE_PROVIDER_SITE_OTHER): Payer: Medicare Other

## 2019-12-23 DIAGNOSIS — I240 Acute coronary thrombosis not resulting in myocardial infarction: Secondary | ICD-10-CM

## 2019-12-23 DIAGNOSIS — Z5181 Encounter for therapeutic drug level monitoring: Secondary | ICD-10-CM | POA: Diagnosis not present

## 2019-12-23 DIAGNOSIS — Q231 Congenital insufficiency of aortic valve: Secondary | ICD-10-CM | POA: Diagnosis not present

## 2019-12-23 DIAGNOSIS — I4892 Unspecified atrial flutter: Secondary | ICD-10-CM

## 2019-12-23 LAB — POCT INR: INR: 1.9 — AB (ref 2.0–3.0)

## 2019-12-23 NOTE — Patient Instructions (Signed)
Description   Take 2 tablets tomorrow, then resume same dosage 1.5 tablets every day except for 2 tablets on Mondays and Fridays Be consistent with your greens. Get INR rechecked in 4 weeks. [phone].

## 2020-01-18 ENCOUNTER — Other Ambulatory Visit: Payer: Self-pay | Admitting: Cardiology

## 2020-02-19 ENCOUNTER — Ambulatory Visit (INDEPENDENT_AMBULATORY_CARE_PROVIDER_SITE_OTHER): Payer: Medicare Other | Admitting: *Deleted

## 2020-02-19 ENCOUNTER — Other Ambulatory Visit: Payer: Self-pay

## 2020-02-19 DIAGNOSIS — I4892 Unspecified atrial flutter: Secondary | ICD-10-CM | POA: Diagnosis not present

## 2020-02-19 DIAGNOSIS — Z5181 Encounter for therapeutic drug level monitoring: Secondary | ICD-10-CM

## 2020-02-19 LAB — POCT INR: INR: 1.9 — AB (ref 2.0–3.0)

## 2020-02-19 NOTE — Patient Instructions (Signed)
Description   Take 2 tablets today and then start taking 1.5 tablets daily except for 2 tablets on Mondays, Wednesday and Fridays. Be consistent with your greens. Get INR rechecked in 3 weeks. [phone].

## 2020-02-26 ENCOUNTER — Telehealth (HOSPITAL_COMMUNITY): Payer: Self-pay | Admitting: Licensed Clinical Social Worker

## 2020-02-26 NOTE — Telephone Encounter (Signed)
CSW received phone call from pt requesting help with getting COVID-19 vaccine.  CSW assisted pt in calling [place] and got him signed up for a shot at the [place] on 3/16 at 12:15pm in their drive thru   Bonnie will continue to follow and assist as needed  Jorge Ny, [place] [facility] Desk#: [phone] Cell#: [phone]

## 2020-02-27 NOTE — Telephone Encounter (Signed)
Entered in error

## 2020-03-11 ENCOUNTER — Other Ambulatory Visit: Payer: Self-pay

## 2020-03-11 ENCOUNTER — Ambulatory Visit (INDEPENDENT_AMBULATORY_CARE_PROVIDER_SITE_OTHER): Payer: Medicare Other | Admitting: *Deleted

## 2020-03-11 ENCOUNTER — Telehealth (HOSPITAL_COMMUNITY): Payer: Self-pay | Admitting: Licensed Clinical Social Worker

## 2020-03-11 DIAGNOSIS — I4892 Unspecified atrial flutter: Secondary | ICD-10-CM | POA: Diagnosis not present

## 2020-03-11 DIAGNOSIS — Z5181 Encounter for therapeutic drug level monitoring: Secondary | ICD-10-CM | POA: Diagnosis not present

## 2020-03-11 LAB — POCT INR: INR: 1.8 — AB (ref 2.0–3.0)

## 2020-03-11 NOTE — Patient Instructions (Signed)
Description   Take 2 tablets today and then start taking 1.5 tablets daily except for 2 tablets on Mondays, Wednesday and Fridays. Be consistent with your greens. Get INR rechecked in 3 weeks. [phone].

## 2020-03-11 NOTE — Telephone Encounter (Signed)
CSW received call from pt requesting medical guidance for cold.  Has a slight productive cough which has lasted about a week. No significant weight gain or other symptoms- has been able to work out 4 times this week so not feeling badly enough to remain home.  CSW discussed with triage RN who recommends trying delcim or robitussin for the cough- pt informed.  CSW had also spoken with pt about going to PCP- pt reports he does not see anyone at this time and is not interesting in getting another provider  Will continue to follow and assist as needed  Jorge Ny, [facility] Desk#: [phone] Cell#: [phone]

## 2020-03-22 ENCOUNTER — Ambulatory Visit: Payer: Self-pay

## 2020-04-02 ENCOUNTER — Other Ambulatory Visit: Payer: Self-pay | Admitting: Cardiology

## 2020-04-02 NOTE — Addendum Note (Signed)
Addended by: Johny Shock B on: 04/02/2020 11:46 AM   Modules accepted: Orders

## 2020-04-02 NOTE — Telephone Encounter (Signed)
Pt over due for an appointment. Called an spoke to pt and scheduled him to come in on 04/07/2019. Pt stated that he needed a refill, refill sent for a 1 month supply.

## 2020-04-06 ENCOUNTER — Ambulatory Visit (INDEPENDENT_AMBULATORY_CARE_PROVIDER_SITE_OTHER): Payer: Medicare Other | Admitting: Gastroenterology

## 2020-04-06 ENCOUNTER — Other Ambulatory Visit: Payer: Self-pay

## 2020-04-06 ENCOUNTER — Ambulatory Visit (INDEPENDENT_AMBULATORY_CARE_PROVIDER_SITE_OTHER): Payer: Medicare Other | Admitting: *Deleted

## 2020-04-06 ENCOUNTER — Encounter: Payer: Self-pay | Admitting: Gastroenterology

## 2020-04-06 VITALS — BP 124/62 | HR 84 | Temp 98.3°F | Ht 69.5 in | Wt 279.4 lb

## 2020-04-06 DIAGNOSIS — R1084 Generalized abdominal pain: Secondary | ICD-10-CM | POA: Diagnosis not present

## 2020-04-06 DIAGNOSIS — R194 Change in bowel habit: Secondary | ICD-10-CM | POA: Diagnosis not present

## 2020-04-06 DIAGNOSIS — I4892 Unspecified atrial flutter: Secondary | ICD-10-CM

## 2020-04-06 DIAGNOSIS — R140 Abdominal distension (gaseous): Secondary | ICD-10-CM | POA: Insufficient documentation

## 2020-04-06 DIAGNOSIS — Z5181 Encounter for therapeutic drug level monitoring: Secondary | ICD-10-CM

## 2020-04-06 LAB — POCT INR: INR: 2.1 (ref 2.0–3.0)

## 2020-04-06 NOTE — Progress Notes (Signed)
04/06/2020 [name] [id number] 30-Apr-1961   HISTORY OF PRESENT ILLNESS: This is a 59 year old male is a patient of Dr. [name].  He had an EGD in June 2020 at which time he had multiple bleeding gastric ulcers in the setting of anticoagulation use.  He recovered from that and has continued on pantoprazole 40 mg daily.  Then he had colonoscopy in November 2020 at which time he had 2 diminutive sessile serrated polyps removed with recall recommended a 7-year interval.  Also had internal hemorrhoids.  Nonetheless, he is here today complaining of some abdominal issues.  He says "I am having a blockage in my stomach".  He reports having 1 or 2 bowel movements a day, but says sometimes that there are liquid and sometimes small pieces.  He says that they just are not normal like they are supposed to be.  He says that this has been going on for about 2 months.  He hears a lot of noise in his abdomen and says that it feels and sounds like something is "trying to break through something".  No abdominal pain per se, but abdomen feels diffusely distended and bloated.  No nausea and vomiting.  He is frustrated because he says that he works out and he has not been able to lose weight, in fact has gained weight.   Past Medical History:  Diagnosis Date  . Acute gastric ulcer with hemorrhage   . Acute respiratory failure ([place]) 05/11/2019  . Aortic valve disease    a. 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 Atrial flutter ([facility])   . Benign hypertensive heart and renal disease   . Cardiogenic shock ([place])   . Chronic combined systolic and diastolic CHF (congestive heart failure) ([place])   . Chronic kidney disease (CKD), stage III (moderate)   . Diabetes mellitus ([place])   . Essential hypertension 11/19/2014  . Gout   . Hx of colonic polyps 11/11/2019  . Hyperlipidemia   . Insomnia   . Iron deficiency anemia   . Murmur   .  Noncompliance   . Obesity   . Pseudomonas pneumonia ([place]) 05/16/2019  . Pulmonary hypertension ([place])    Past Surgical History:  Procedure Laterality Date  . AORTIC VALVE REPLACEMENT N/A 04/05/2015   Procedure: AORTIC VALVE REPLACEMENT (AVR) using a 13mm Edwards Aortic Magna Ease Valve ;  Surgeon: [name], MD;  Location: [facility];  Service: Open Heart Surgery;  Laterality: N/A;  . COLONOSCOPY WITH PROPOFOL N/A 11/05/2019   Procedure: COLONOSCOPY WITH PROPOFOL;  Surgeon: [name], MD;  Location: [facility];  Service: Endoscopy;  Laterality: N/A;  . ESOPHAGOGASTRODUODENOSCOPY (EGD) WITH PROPOFOL N/A 05/19/2019   Procedure: ESOPHAGOGASTRODUODENOSCOPY (EGD) WITH PROPOFOL;  Surgeon: [name], MD;  Location: [facility];  Service: Gastroenterology;  Laterality: N/A;  . HEMOSTASIS CLIP PLACEMENT  05/19/2019   Procedure: HEMOSTASIS CLIP PLACEMENT;  Surgeon: [name], MD;  Location: [facility] ENDOSCOPY;  Service: Gastroenterology;;  . LEFT AND RIGHT HEART CATHETERIZATION WITH CORONARY ANGIOGRAM N/A 11/27/2014   Procedure: LEFT AND RIGHT HEART CATHETERIZATION WITH CORONARY ANGIOGRAM;  Surgeon: [name], MD;  Location: [facility] CATH LAB;  Service: Cardiovascular;  Laterality: N/A;  . MAZE N/A 04/05/2015   Procedure: MAZE;  Surgeon: [name], MD;  Location: [facility];  Service: Open Heart Surgery;  Laterality: N/A;  . POLYPECTOMY  11/05/2019   Procedure: POLYPECTOMY;  Surgeon: [name],  MD;  Location: [facility]OS[facility];  Service: Endoscopy;;  . REPLACEMENT ASCENDING AORTA N/A 04/05/2015   Procedure: REPLACEMENT ASCENDING AORTA with a 90mm Hemashield Platinum Graft;  Surgeon: [name], MD;  Location: [facility];  Service: Open Heart Surgery;  Laterality: N/A;  . TEE WITHOUT CARDIOVERSION N/A 04/05/2015   Procedure: TRANSESOPHAGEAL ECHOCARDIOGRAM (TEE);  Surgeon: [name], MD;  Location: [facility];  Service: Open Heart Surgery;  Laterality: N/A;    reports that he has never smoked.  He has never used smokeless tobacco. He reports current alcohol use. He reports that he does not use drugs. family history includes Liver disease in his cousin; Other in his mother. No Known Allergies    Outpatient Encounter Medications as of 04/06/2020  Medication Sig  . allopurinol (ZYLOPRIM) 100 MG tablet Take 1 tablet (100 mg total) by mouth daily. (Patient taking differently: Take 100 mg by mouth daily as needed (gout). )  . AMBULATORY NON FORMULARY MEDICATION Take 1 tablet by mouth 2 (two) times daily. Swiss Kriss Herbal Laxative tabs  . amoxicillin (AMOXIL) 500 MG tablet Take 4 tablets (2,000 mg total) by mouth once as needed for up to 1 dose (take 1 hour prior to dental appointment). (Patient taking differently: Take 2,000 mg by mouth See admin instructions. Take 2000 mg 1 hour prior to dental appointment)  . ferrous sulfate 325 (65 FE) MG EC tablet Take 325 mg by mouth daily.  . furosemide (LASIX) 40 MG tablet Take 1 tablet (40 mg total) by mouth daily. Take an extra 40 mg of lasix if your weight increase 3 lbs in 1 day or 5 lbs in a week or SOB  . hydrALAZINE (APRESOLINE) 25 MG tablet Take two in the AM and one the PM  . isosorbide mononitrate (IMDUR) 60 MG 24 hr tablet Take 1 tablet (60 mg total) by mouth daily.  . Multiple Vitamins-Minerals (EMERGEN-C IMMUNE) PACK Take 1 packet by mouth daily.  . Naphazoline-Glycerin (REDNESS RELIEF OP) Place 1 drop into both eyes daily as needed (redness).  . pantoprazole (PROTONIX) 40 MG tablet Take 1 tablet (40 mg total) by mouth daily.  . sacubitril-valsartan (ENTRESTO) 97-103 MG Take 1 tablet by mouth 2 (two) times daily.  [name] spironolactone (ALDACTONE) 25 MG tablet Take 1 tablet (25 mg total) by mouth daily.  [name] warfarin (COUMADIN) 5 MG tablet TAKE 1 AND 1/2 TO 2 TABLETS BY MOUTH AS DIRECTED BY COUMADIN CLINIC   No facility-administered encounter medications on file as of 04/06/2020.     REVIEW OF SYSTEMS  : All other systems reviewed and  negative except where noted in the History of Present Illness.   PHYSICAL EXAM: BP 124/62   Pulse 84   Temp 98.3 F (36.8 C)   Ht 5' 9.5" (1.765 m)   Wt 279 lb 6.4 oz (126.7 kg)   BMI 40.67 kg/m  General: Well developed AA male in no acute distress Head: Normocephalic and atraumatic Eyes:  Sclerae anicteric, conjunctiva pink. Ears: Normal auditory acuity Lungs: Clear throughout to auscultation; no increased WOB. Heart: Regular rate and rhythm; no M/R/G. Abdomen: Soft, non-distended.  Somewhat hyperactive BS present.  Non-tender. Musculoskeletal: Symmetrical with no gross deformities  Skin: No lesions on visible extremities Extremities: No edema  Neurological: Alert oriented x 4, grossly non-focal Psychological:  Alert and cooperative. Normal mood and affect  ASSESSMENT AND PLAN: *59 year old male with complaints of generalized abdominal distention/bloating and discomfort with altered bowel habits.  Also complains of hyperactive bowel sounds/borborygmi.  He is convinced that he has a blockage somewhere.  Had both EGD and colonoscopy within the past year.  We will plan for CT scan of the abdomen and pelvis with contrast.   CC:  [name], MD

## 2020-04-06 NOTE — Patient Instructions (Signed)
If you are age 60 or older, your body mass index should be between 23-30. Your Body mass index is 40.67 kg/m. If this is out of the aforementioned range listed, please consider follow up with your Primary Care Provider.  If you are age 75 or younger, your body mass index should be between 19-25. Your Body mass index is 40.67 kg/m. If this is out of the aformentioned range listed, please consider follow up with your Primary Care Provider.   You have been scheduled for a CT scan of the abdomen and pelvis at [facility] ([address]---this is in the same building as [employer]).   You are scheduled on Friday 04/16/20 at 2 pm. You should arrive 15 minutes prior to your appointment time for registration. Please follow the written instructions below on the day of your exam:  WARNING: IF YOU ARE ALLERGIC TO IODINE/X-RAY DYE, PLEASE NOTIFY RADIOLOGY IMMEDIATELY AT [phone]! YOU WILL BE GIVEN A 13 HOUR PREMEDICATION PREP.  1) Do not eat or drink anything after 10 am (4 hours prior to your test) 2) You have been given 2 bottles of oral contrast to drink. The solution may taste better if refrigerated, but do NOT add ice or any other liquid to this solution. Shake well before drinking.    Drink 1 bottle of contrast @ 12 pm (2 hours prior to your exam)  Drink 1 bottle of contrast @ 1 pm (1 hour prior to your exam)  You may take any medications as prescribed with a small amount of water, if necessary. If you take any of the following medications: METFORMIN, GLUCOPHAGE, GLUCOVANCE, AVANDAMET, RIOMET, FORTAMET, [place] MET, JANUMET, GLUMETZA or METAGLIP, you MAY be asked to HOLD this medication 48 hours AFTER the exam.  The purpose of you drinking the oral contrast is to aid in the visualization of your intestinal tract. The contrast solution may cause some diarrhea. Depending on your individual set of symptoms, you may also receive an intravenous injection of x-ray  contrast/dye. Plan on being at [facility] for 30 minutes or longer, depending on the type of exam you are having performed.  This test typically takes 30-45 minutes to complete.  If you have any questions regarding your exam or if you need to reschedule, you may call the CT department at [phone] between the hours of 8:00 am and 5:00 pm, Monday-Friday.  ________________________________________________________________

## 2020-04-06 NOTE — Patient Instructions (Signed)
Description   Continue taking 1.5 tablets daily except for 2 tablets on Mondays, Wednesday and Fridays. Be consistent with your greens. Get INR rechecked in 4 weeks. [phone].

## 2020-04-14 ENCOUNTER — Telehealth: Payer: Self-pay | Admitting: Gastroenterology

## 2020-04-14 ENCOUNTER — Other Ambulatory Visit: Payer: Self-pay | Admitting: *Deleted

## 2020-04-14 DIAGNOSIS — R1084 Generalized abdominal pain: Secondary | ICD-10-CM

## 2020-04-14 DIAGNOSIS — R140 Abdominal distension (gaseous): Secondary | ICD-10-CM

## 2020-04-14 DIAGNOSIS — R194 Change in bowel habit: Secondary | ICD-10-CM

## 2020-04-14 NOTE — Progress Notes (Signed)
Vanda from [place] CT needs order before upcoming CT for an I-STAT Creatinine. Order placed.

## 2020-04-14 NOTE — Telephone Encounter (Signed)
Order placed

## 2020-04-16 ENCOUNTER — Other Ambulatory Visit: Payer: Self-pay

## 2020-04-16 ENCOUNTER — Encounter (HOSPITAL_COMMUNITY): Payer: Self-pay

## 2020-04-16 ENCOUNTER — Ambulatory Visit (HOSPITAL_COMMUNITY)
Admission: RE | Admit: 2020-04-16 | Discharge: 2020-04-16 | Disposition: A | Discharge status: Home / Self Care | Payer: Medicare Other | Source: Ambulatory Visit | Attending: Gastroenterology | Admitting: Gastroenterology

## 2020-04-16 ENCOUNTER — Other Ambulatory Visit: Payer: Self-pay | Admitting: Gastroenterology

## 2020-04-16 DIAGNOSIS — R1084 Generalized abdominal pain: Secondary | ICD-10-CM | POA: Insufficient documentation

## 2020-04-16 DIAGNOSIS — R140 Abdominal distension (gaseous): Secondary | ICD-10-CM

## 2020-04-16 DIAGNOSIS — R194 Change in bowel habit: Secondary | ICD-10-CM | POA: Diagnosis present

## 2020-04-16 LAB — POCT I-STAT CREATININE: Creatinine, Ser: 2.2 mg/dL — ABNORMAL HIGH (ref 0.61–1.24)

## 2020-04-16 MED ORDER — SODIUM CHLORIDE (PF) 0.9 % IJ SOLN
INTRAMUSCULAR | Status: AC
  Filled 2020-04-16: qty 50

## 2020-04-16 MED ORDER — IOHEXOL 300 MG/ML  SOLN
100.0000 mL | Freq: Once | INTRAMUSCULAR | Status: AC | PRN
  Administered 2020-04-16: 100 mL via INTRAVENOUS

## 2020-04-19 ENCOUNTER — Telehealth: Payer: Self-pay | Admitting: Gastroenterology

## 2020-04-19 NOTE — Telephone Encounter (Signed)
Jess have you reviewed CT results?

## 2020-05-02 ENCOUNTER — Other Ambulatory Visit: Payer: Self-pay | Admitting: Cardiology

## 2020-05-04 ENCOUNTER — Ambulatory Visit (INDEPENDENT_AMBULATORY_CARE_PROVIDER_SITE_OTHER): Payer: Medicare Other | Admitting: *Deleted

## 2020-05-04 ENCOUNTER — Other Ambulatory Visit: Payer: Self-pay

## 2020-05-04 DIAGNOSIS — Z5181 Encounter for therapeutic drug level monitoring: Secondary | ICD-10-CM

## 2020-05-04 DIAGNOSIS — I4892 Unspecified atrial flutter: Secondary | ICD-10-CM | POA: Diagnosis not present

## 2020-05-04 LAB — POCT INR: INR: 2.2 (ref 2.0–3.0)

## 2020-05-04 NOTE — Patient Instructions (Addendum)
Description   Continue taking 1.5 tablets daily except for 2 tablets on Mondays, Wednesday and Fridays. Be consistent with your greens. Get INR rechecked in 5 weeks. [phone].

## 2020-05-19 ENCOUNTER — Ambulatory Visit: Payer: Medicare Other | Admitting: Internal Medicine

## 2020-05-24 ENCOUNTER — Telehealth (HOSPITAL_COMMUNITY): Payer: Self-pay | Admitting: Pharmacy Technician

## 2020-05-24 NOTE — Telephone Encounter (Signed)
It's time to renew patient's [employer] assistance. Will check to see the results of prior authorization and proceed with further steps.  Patient Advocate Encounter   Received notification from [employer] that prior authorization for [name] is required.   PA submitted on [employer] EPA Key [id number] Status is pending   Will continue to follow.

## 2020-05-25 ENCOUNTER — Telehealth (HOSPITAL_COMMUNITY): Payer: Self-pay | Admitting: Pharmacist

## 2020-05-25 MED ORDER — SACUBITRIL-VALSARTAN 97-103 MG PO TABS: 1.0000 | ORAL_TABLET | Freq: Two times a day (BID) | ORAL | 3 refills | Status: DC

## 2020-05-25 NOTE — Telephone Encounter (Signed)
Advanced Heart Failure Patient Advocate Encounter  Prior Authorization for [name] has been approved.     Effective dates: 05/24/20 through 05/24/21  Patients co-pay is $467.55 (deductible not met). Called and spoke with patient, he is aware that we will go back to [employer] if PAN is closed when he runs low on grant funds.  Was successful in securing patient an $1000.00 grant from Patient [employer] [facility]) to provide copayment coverage for [place].  This will keep the out of pocket expense at $0.     I have spoken with the patient.    The billing information is as follows and has been shared with [facility].   Member ID: [id number] Group ID: [id number] RxBin ID: [id number] PCN: [id number] Eligibility Start Date: 02/25/2020 Eligibility End Date: 05/24/2021 Assistance Amount: $1,000.00  Fund:  Heart Failure  Charlann Boxer, CPhT

## 2020-05-25 NOTE — Telephone Encounter (Signed)
Refill for Entresto sent to [facility] per patient request.

## 2020-06-08 ENCOUNTER — Ambulatory Visit (INDEPENDENT_AMBULATORY_CARE_PROVIDER_SITE_OTHER): Payer: Medicare Other | Admitting: *Deleted

## 2020-06-08 ENCOUNTER — Other Ambulatory Visit: Payer: Self-pay

## 2020-06-08 DIAGNOSIS — Z5181 Encounter for therapeutic drug level monitoring: Secondary | ICD-10-CM | POA: Diagnosis not present

## 2020-06-08 DIAGNOSIS — I240 Acute coronary thrombosis not resulting in myocardial infarction: Secondary | ICD-10-CM

## 2020-06-08 DIAGNOSIS — Q231 Congenital insufficiency of aortic valve: Secondary | ICD-10-CM

## 2020-06-08 DIAGNOSIS — I4892 Unspecified atrial flutter: Secondary | ICD-10-CM

## 2020-06-08 LAB — POCT INR: INR: 2.2 (ref 2.0–3.0)

## 2020-06-08 NOTE — Patient Instructions (Signed)
Description   °Continue taking 1.5 tablets daily except for 2 tablets on Mondays, Wednesday and Fridays. Be consistent with your greens. Get INR rechecked in 6 weeks. [phone].   °  ° °

## 2020-06-09 NOTE — Telephone Encounter (Signed)
Patient called to check on status of his entresto prior authorization. I informed him of the note below.

## 2020-06-21 ENCOUNTER — Other Ambulatory Visit: Payer: Self-pay | Admitting: Cardiology

## 2020-07-09 ENCOUNTER — Other Ambulatory Visit: Payer: Self-pay | Admitting: Internal Medicine

## 2020-07-14 ENCOUNTER — Telehealth (HOSPITAL_COMMUNITY): Payer: Self-pay | Admitting: Licensed Clinical Social Worker

## 2020-07-14 NOTE — Telephone Encounter (Signed)
Pt called [name] and informed he only has a few days left of his [name] and when he called [employer] they said he had to reapply to get medications through them.  [name] completed chart review and saw that this was discussed with pt in June and that he was signed up for $1000 PAN foundation grant at that time- [name] checked and he still has $1000 available on that grant.  [name] left message for pt to inform he needs to pick up medication from his normal pharmacy and provided him with PAN grant card information to provide to pharmacy.  Jorge Ny, LCSW Clinical Social Worker [facility] Desk#: [phone] Cell#: [phone]

## 2020-07-15 ENCOUNTER — Telehealth (HOSPITAL_COMMUNITY): Payer: Self-pay | Admitting: Licensed Clinical Social Worker

## 2020-07-15 NOTE — Telephone Encounter (Signed)
CSW received call from pt who reports he tried to get his meds from [facility] but they won't have the medication in stock until next week.  CSW got pt one bottle of samples and placed at the front desk- he will plan to come pick up tomorrow during his lunch break from school.  Jorge Ny, LCSW Clinical Social Worker [facility] Desk#: [phone] Cell#: [phone]

## 2020-07-23 ENCOUNTER — Telehealth: Payer: Self-pay | Admitting: Pharmacist

## 2020-07-23 NOTE — Telephone Encounter (Signed)
Pt missed INR check on 8/3, called and left message to reschedule.

## 2020-08-03 ENCOUNTER — Ambulatory Visit (HOSPITAL_COMMUNITY)
Admission: EM | Admit: 2020-08-03 | Discharge: 2020-08-03 | Disposition: A | Discharge status: Home / Self Care | Payer: Medicare Other | Attending: Family Medicine | Admitting: Family Medicine

## 2020-08-03 ENCOUNTER — Other Ambulatory Visit: Payer: Self-pay

## 2020-08-03 ENCOUNTER — Encounter (HOSPITAL_COMMUNITY): Payer: Self-pay

## 2020-08-03 ENCOUNTER — Telehealth (HOSPITAL_COMMUNITY): Payer: Self-pay | Admitting: Licensed Clinical Social Worker

## 2020-08-03 ENCOUNTER — Ambulatory Visit (INDEPENDENT_AMBULATORY_CARE_PROVIDER_SITE_OTHER): Payer: Medicare Other

## 2020-08-03 DIAGNOSIS — Z7901 Long term (current) use of anticoagulants: Secondary | ICD-10-CM | POA: Insufficient documentation

## 2020-08-03 DIAGNOSIS — R05 Cough: Secondary | ICD-10-CM | POA: Diagnosis present

## 2020-08-03 DIAGNOSIS — Z8719 Personal history of other diseases of the digestive system: Secondary | ICD-10-CM | POA: Insufficient documentation

## 2020-08-03 DIAGNOSIS — R059 Cough, unspecified: Secondary | ICD-10-CM

## 2020-08-03 DIAGNOSIS — Z8711 Personal history of peptic ulcer disease: Secondary | ICD-10-CM | POA: Diagnosis not present

## 2020-08-03 DIAGNOSIS — I272 Pulmonary hypertension, unspecified: Secondary | ICD-10-CM | POA: Diagnosis not present

## 2020-08-03 DIAGNOSIS — E669 Obesity, unspecified: Secondary | ICD-10-CM | POA: Diagnosis not present

## 2020-08-03 DIAGNOSIS — E785 Hyperlipidemia, unspecified: Secondary | ICD-10-CM | POA: Diagnosis not present

## 2020-08-03 DIAGNOSIS — I4892 Unspecified atrial flutter: Secondary | ICD-10-CM | POA: Diagnosis not present

## 2020-08-03 DIAGNOSIS — I5042 Chronic combined systolic (congestive) and diastolic (congestive) heart failure: Secondary | ICD-10-CM | POA: Diagnosis not present

## 2020-08-03 DIAGNOSIS — I130 Hypertensive heart and chronic kidney disease with heart failure and stage 1 through stage 4 chronic kidney disease, or unspecified chronic kidney disease: Secondary | ICD-10-CM | POA: Diagnosis not present

## 2020-08-03 DIAGNOSIS — I420 Dilated cardiomyopathy: Secondary | ICD-10-CM | POA: Diagnosis not present

## 2020-08-03 DIAGNOSIS — G4700 Insomnia, unspecified: Secondary | ICD-10-CM | POA: Insufficient documentation

## 2020-08-03 DIAGNOSIS — Z9889 Other specified postprocedural states: Secondary | ICD-10-CM | POA: Diagnosis not present

## 2020-08-03 DIAGNOSIS — Z79899 Other long term (current) drug therapy: Secondary | ICD-10-CM | POA: Diagnosis not present

## 2020-08-03 DIAGNOSIS — Z20822 Contact with and (suspected) exposure to covid-19: Secondary | ICD-10-CM | POA: Insufficient documentation

## 2020-08-03 DIAGNOSIS — D509 Iron deficiency anemia, unspecified: Secondary | ICD-10-CM | POA: Insufficient documentation

## 2020-08-03 DIAGNOSIS — Z953 Presence of xenogenic heart valve: Secondary | ICD-10-CM | POA: Diagnosis not present

## 2020-08-03 DIAGNOSIS — Z6837 Body mass index (BMI) 37.0-37.9, adult: Secondary | ICD-10-CM | POA: Insufficient documentation

## 2020-08-03 DIAGNOSIS — N183 Chronic kidney disease, stage 3 unspecified: Secondary | ICD-10-CM | POA: Diagnosis not present

## 2020-08-03 HISTORY — DX: COVID-19: U07.1

## 2020-08-03 NOTE — ED Triage Notes (Signed)
Pt c/o productive cough w/yellow mucousx1 mo. Pt denies any other symptoms

## 2020-08-03 NOTE — Discharge Instructions (Addendum)
You x ray did not show any infection or concerns. Recommend trying zyrtec daily  Otherwise follow-up with your primary for further issues.

## 2020-08-03 NOTE — Telephone Encounter (Signed)
Pt called CSW and requested help getting PCP appt.  CSW reached out to CHW case manager and was able to secure pt appt on 9/27.   Pt informed of appt information but states he has current concerns with cough that he has had for several weeks- has already discussed with our triage staff who recommended OTC medications to try but pt still having symptoms.  CSW suggested going to urgent care for treatment as we could not find PCP office that could see him imminently as he is not established with anyone.  Pt expressed understanding- CSW will continue to follow and assist as needed  Jorge Ny, [facility] Desk#: [phone] Cell#: [phone]

## 2020-08-04 ENCOUNTER — Telehealth: Payer: Self-pay | Admitting: *Deleted

## 2020-08-04 LAB — SARS CORONAVIRUS 2 (TAT 6-24 HRS): SARS Coronavirus 2: NEGATIVE

## 2020-08-04 NOTE — ED Provider Notes (Signed)
[facility]    CSN: [id number] Arrival date & time: 08/03/20  1556      History   Chief Complaint Chief Complaint  Patient presents with  . Cough    HPI LAKE CINQUEMANI is a 59 y.o. male.   Patient is a 59 year old male with significant past medical history.  He presents with productive cough with yellow/white mucus x1 month.  Symptoms have been constant worse at nighttime.  Denies any associated shortness of breath.  Denies any lower extremity swelling.  No fever, chills, body aches.  Has been fully vaccinated.  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  Denies any specific history of allergies.  No abdominal pain, chest pain, nausea or vomiting.     Past Medical History:  Diagnosis Date  . Acute gastric ulcer with hemorrhage   . Acute respiratory failure ([place]) 05/11/2019  . Aortic valve disease    a. 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 Atrial flutter ([place])   . Benign hypertensive heart and renal disease   . Cardiogenic shock ([place])   . Chronic combined systolic and diastolic CHF (congestive heart failure) ([place])   . Chronic kidney disease (CKD), stage III (moderate)   . COVID-19   . Diabetes mellitus ([place])   . Essential hypertension 11/19/2014  . Gout   . Hx of colonic polyps 11/11/2019  . Hyperlipidemia   . Insomnia   . Iron deficiency anemia   . Murmur   . Noncompliance   . Obesity   . Pseudomonas pneumonia ([place]) 05/16/2019  . Pulmonary hypertension [facility])     Patient Active Problem List   Diagnosis Date Noted  . Abdominal distention 04/06/2020  . Generalized abdominal pain 04/06/2020  . Change in bowel habits 04/06/2020  . Hx of colonic polyps 11/11/2019  . Anemia, iron deficiency 11/05/2019  . LV (left ventricular) mural thrombus without MI ([place]) 06/19/2019  . Internal and external bleeding hemorrhoids   . S/P AVR 04/05/2015  .  Aortic stenosis 03/29/2015  . Bicuspid aortic valve 03/29/2015  . Acute systolic CHF (congestive heart failure) ([place]) 03/27/2015  . Aortic insufficiency 11/19/2014  . Congestive dilated cardiomyopathy ([place]) 11/19/2014  . Essential hypertension 11/19/2014  . 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 Chronic systolic CHF (congestive heart failure) ([place]) 11/19/2014  . SOB (shortness of breath) 11/19/2014  . Atrial flutter ([place]) 11/19/2014    Past Surgical History:  Procedure Laterality Date  . AORTIC VALVE REPLACEMENT N/A 04/05/2015   Procedure: AORTIC VALVE REPLACEMENT (AVR) using a 49mm Edwards Aortic Magna Ease Valve ;  Surgeon: [name], MD;  Location: [place];  Service: Open Heart Surgery;  Laterality: N/A;  . COLONOSCOPY WITH PROPOFOL N/A 11/05/2019   Procedure: COLONOSCOPY WITH PROPOFOL;  Surgeon: [name], MD;  [facility];  Service: Endoscopy;  Laterality: N/A;  . ESOPHAGOGASTRODUODENOSCOPY (EGD) WITH PROPOFOL N/A 05/19/2019   Procedure: ESOPHAGOGASTRODUODENOSCOPY (EGD) WITH PROPOFOL;  Surgeon: [name], MD;  Location: [place];  Service: Gastroenterology;  Laterality: N/A;  . HEMOSTASIS CLIP PLACEMENT  05/19/2019   Procedure: HEMOSTASIS CLIP PLACEMENT;  Surgeon: [name], MD;  Location: [facility] ENDOSCOPY;  Service: Gastroenterology;;  . LEFT AND RIGHT HEART CATHETERIZATION WITH CORONARY ANGIOGRAM N/A 11/27/2014   Procedure: LEFT AND RIGHT HEART CATHETERIZATION WITH CORONARY ANGIOGRAM;  Surgeon: [name], MD;  Location: [facility] CATH LAB;  Service: Cardiovascular;  Laterality: N/A;  . MAZE N/A 04/05/2015   Procedure: MAZE;  Surgeon: [name], MD;  Location: [place];  Service: Open Heart Surgery;  Laterality: N/A;  . POLYPECTOMY  11/05/2019   Procedure: POLYPECTOMY;  Surgeon: [name], MD;  [facility];  Service: Endoscopy;;  . REPLACEMENT ASCENDING AORTA N/A 04/05/2015   Procedure: REPLACEMENT ASCENDING AORTA with a  63mm Hemashield Platinum Graft;  Surgeon: [name], MD;  Location: [place];  Service: Open Heart Surgery;  Laterality: N/A;  . TEE WITHOUT CARDIOVERSION N/A 04/05/2015   Procedure: TRANSESOPHAGEAL ECHOCARDIOGRAM (TEE);  Surgeon: [name], MD;  Location: [place];  Service: Open Heart Surgery;  Laterality: N/A;       Home Medications    Prior to Admission medications   Medication Sig Start Date End Date Taking? Authorizing Provider  allopurinol (ZYLOPRIM) 100 MG tablet Take 1 tablet (100 mg total) by mouth daily. Patient taking differently: Take 100 mg by mouth daily as needed (gout).  09/22/19   Bensimhon, Shaune Pascal, MD  AMBULATORY NON FORMULARY MEDICATION Take 1 tablet by mouth 2 (two) times daily. Swiss Kriss Herbal Laxative tabs    [provider]  amoxicillin (AMOXIL) 500 MG tablet Take 4 tablets (2,000 mg total) by mouth once as needed for up to 1 dose (take 1 hour prior to dental appointment). Patient taking differently: Take 2,000 mg by mouth See admin instructions. Take 2000 mg 1 hour prior to dental appointment 08/07/19   Bensimhon, Shaune Pascal, MD  ferrous sulfate 325 (65 FE) MG EC tablet Take 325 mg by mouth daily.    [provider]  furosemide (LASIX) 40 MG tablet Take 1 tablet (40 mg total) by mouth daily. Take an extra 40 mg of lasix if your weight increase 3 lbs in 1 day or 5 lbs in a week or SOB 11/21/19 11/20/20  Bensimhon, Shaune Pascal, MD  hydrALAZINE (APRESOLINE) 25 MG tablet Take two in the AM and one the PM 11/21/19   Bensimhon, Shaune Pascal, MD  isosorbide mononitrate (IMDUR) 60 MG 24 hr tablet Take 1 tablet (60 mg total) by mouth daily. 11/21/19   Bensimhon, Shaune Pascal, MD  Multiple Vitamins-Minerals (EMERGEN-C IMMUNE) PACK Take 1 packet by mouth daily.    [provider]  Naphazoline-Glycerin (REDNESS RELIEF OP) Place 1 drop into both eyes daily as needed (redness).    [provider]  pantoprazole (PROTONIX) 40 MG tablet TAKE ONE TABLET BY  MOUTH DAILY 07/09/20   [name], MD  sacubitril-valsartan (ENTRESTO) 97-103 MG Take 1 tablet by mouth 2 (two) times daily. 05/25/20   Bensimhon, Shaune Pascal, MD  spironolactone (ALDACTONE) 25 MG tablet Take 1 tablet (25 mg total) by mouth daily. 11/21/19   Bensimhon, Shaune Pascal, MD  warfarin (COUMADIN) 5 MG tablet TAKE 1 AND 1/2 TO 2 TABLETS BY MOUTH AS DIRECTED BY COUMADIN CLINIC 06/22/20   Sueanne Margarita, MD    Family History Family History  Problem Relation Age of Onset  . Other Mother        MVA  . Liver disease Cousin        cirrhosis; alcohol related  . Heart attack Neg Hx   . Colon cancer Neg Hx   . Esophageal cancer Neg Hx   . Stomach cancer Neg Hx   . Pancreatic cancer Neg Hx     Social History Social History   Tobacco Use  . Smoking status: Never Smoker  .  Smokeless tobacco: Never Used  Vaping Use  . Vaping Use: Never used  Substance Use Topics  . Alcohol use: Yes    Comment: 5 monthly  . Drug use: Never     Allergies   Patient has no known allergies.   Review of Systems Review of Systems   Physical Exam Triage Vital Signs ED Triage Vitals  Enc Vitals Group     BP 08/03/20 1613 (!) 141/81     Pulse Rate 08/03/20 1613 84     Resp 08/03/20 1613 18     Temp 08/03/20 1613 98.4 F (36.9 C)     Temp Source 08/03/20 1613 Oral     SpO2 08/03/20 1613 99 %     Weight 08/03/20 1614 270 lb (122.5 kg)     Height 08/03/20 1614 5\' 11"  (1.803 m)     Head Circumference --      Peak Flow --      Pain Score 08/03/20 1614 0     Pain Loc --      Pain Edu? --      Excl. in [place]? --    No data found.  Updated Vital Signs BP (!) 141/81   Pulse 84   Temp 98.4 F (36.9 C) (Oral)   Resp 18   Ht 5\' 11"  (1.803 m)   Wt 270 lb (122.5 kg)   SpO2 99%   BMI 37.66 kg/m   Visual Acuity Right Eye Distance:   Left Eye Distance:   Bilateral Distance:    Right Eye Near:   Left Eye Near:    Bilateral Near:     Physical Exam Vitals and nursing note reviewed.    Constitutional:      Appearance: Normal appearance.  HENT:     Head: Normocephalic and atraumatic.     Nose: Nose normal.  Eyes:     Conjunctiva/sclera: Conjunctivae normal.  Cardiovascular:     Rate and Rhythm: Normal rate and regular rhythm.  Pulmonary:     Effort: Pulmonary effort is normal.     Breath sounds: Normal breath sounds.  Musculoskeletal:        General: Normal range of motion.     Cervical back: Normal range of motion.     Right lower leg: No edema.     Left lower leg: No edema.  Skin:    General: Skin is warm and dry.  Neurological:     Mental Status: He is alert.  Psychiatric:        Mood and Affect: Mood normal.      UC Treatments / Results  Labs (all labs ordered are listed, but only abnormal results are displayed) Labs Reviewed  SARS CORONAVIRUS 2 (TAT 6-24 HRS)    EKG   Radiology DG Chest 2 View  Result Date: 08/03/2020 CLINICAL DATA:  59 year old male with cough. EXAM: CHEST - 2 VIEW COMPARISON:  Chest radiograph dated 05/22/2019. FINDINGS: There is cardiomegaly with vascular congestion. No focal consolidation, pleural effusion or pneumothorax. Median sternotomy wires and mechanical cardiac valve. Atherosclerotic calcification of the aorta. Degenerative changes of spine. No acute osseous pathology. IMPRESSION: Cardiomegaly with vascular congestion. No focal consolidation. Electronically Signed   By: Anner Crete M.D.   On: 08/03/2020 17:08    Procedures Procedures (including critical care time)  Medications Ordered in UC Medications - No data to display  Initial Impression / Assessment and Plan / UC Course  I have reviewed the triage vital signs and the nursing notes.  Pertinent labs & imaging results that were available during my care of the patient were reviewed by me and considered in my medical decision making (see chart for details).     Cough x1 month X-ray with no acute findings. Could be allergy related.  Recommend Zyrtec  daily. Follow-up with primary care for further issues. Final Clinical Impressions(s) / UC Diagnoses   Final diagnoses:  Cough     Discharge Instructions     You x ray did not show any infection or concerns. Recommend trying zyrtec daily  Otherwise follow-up with your primary for further issues.   ED Prescriptions    None     PDMP not reviewed this encounter.   Loura Halt A, NP 08/04/20 1139

## 2020-08-04 NOTE — Telephone Encounter (Signed)
Called pt since he is overdue for an Anticoagulation Appt; he was last seen on 06/08/2020. Left a message for him to call back.

## 2020-08-05 ENCOUNTER — Telehealth: Payer: Self-pay

## 2020-08-05 NOTE — Telephone Encounter (Signed)
lmomed for overdue inr 

## 2020-08-13 ENCOUNTER — Other Ambulatory Visit: Payer: Self-pay

## 2020-08-13 ENCOUNTER — Ambulatory Visit (INDEPENDENT_AMBULATORY_CARE_PROVIDER_SITE_OTHER): Payer: Medicare Other | Admitting: *Deleted

## 2020-08-13 DIAGNOSIS — Z5181 Encounter for therapeutic drug level monitoring: Secondary | ICD-10-CM

## 2020-08-13 DIAGNOSIS — I4892 Unspecified atrial flutter: Secondary | ICD-10-CM

## 2020-08-13 LAB — POCT INR: INR: 2.7 (ref 2.0–3.0)

## 2020-08-13 NOTE — Patient Instructions (Signed)
Description   Continue taking 1.5 tablets daily except for 2 tablets on Mondays, Wednesday and Fridays. Be consistent with your greens. Get INR rechecked in 6 weeks. [phone].

## 2020-08-23 ENCOUNTER — Other Ambulatory Visit (HOSPITAL_COMMUNITY): Payer: Self-pay | Admitting: Internal Medicine

## 2020-09-13 ENCOUNTER — Ambulatory Visit: Payer: Medicare Other | Admitting: Nurse Practitioner

## 2020-09-24 ENCOUNTER — Other Ambulatory Visit: Payer: Self-pay

## 2020-09-24 ENCOUNTER — Ambulatory Visit (INDEPENDENT_AMBULATORY_CARE_PROVIDER_SITE_OTHER): Payer: Medicare Other

## 2020-09-24 DIAGNOSIS — Z5181 Encounter for therapeutic drug level monitoring: Secondary | ICD-10-CM | POA: Diagnosis not present

## 2020-09-24 DIAGNOSIS — I240 Acute coronary thrombosis not resulting in myocardial infarction: Secondary | ICD-10-CM | POA: Diagnosis not present

## 2020-09-24 DIAGNOSIS — Q231 Congenital insufficiency of aortic valve: Secondary | ICD-10-CM | POA: Diagnosis not present

## 2020-09-24 DIAGNOSIS — I4892 Unspecified atrial flutter: Secondary | ICD-10-CM

## 2020-09-24 LAB — POCT INR: INR: 1.8 — AB (ref 2.0–3.0)

## 2020-09-24 NOTE — Patient Instructions (Signed)
Description   Take 2 tablets today and tomorrow, then resume same dosage 1.5 tablets daily except for 2 tablets on Mondays, Wednesday and Fridays. Be consistent with your greens. Get INR rechecked in 6 weeks, per pt request. [phone].

## 2020-09-30 ENCOUNTER — Other Ambulatory Visit (HOSPITAL_COMMUNITY): Payer: Self-pay | Admitting: Internal Medicine

## 2020-09-30 ENCOUNTER — Other Ambulatory Visit: Payer: Self-pay | Admitting: Cardiology

## 2020-10-01 ENCOUNTER — Telehealth: Payer: Self-pay | Admitting: *Deleted

## 2020-10-01 NOTE — Telephone Encounter (Signed)
Electronic refill system refill is down so called pt's pharmacy, spoke with Wilfred Lacy. Gave a verbal order for Warfarin 5mg  take 1.5 tablets to 2 tablets daily or as directed by Coumadin Clinic. Verbal for Warfarin 5mg  tablets with 55 tablets 1 refill and noted to him this is 30 day supply.

## 2020-10-04 ENCOUNTER — Encounter (HOSPITAL_COMMUNITY): Payer: Self-pay | Admitting: Cardiology

## 2020-10-26 ENCOUNTER — Encounter (HOSPITAL_COMMUNITY): Payer: Self-pay | Admitting: Emergency Medicine

## 2020-10-26 ENCOUNTER — Ambulatory Visit (HOSPITAL_COMMUNITY)
Admission: EM | Admit: 2020-10-26 | Discharge: 2020-10-26 | Disposition: A | Discharge status: Home / Self Care | Payer: Medicare Other | Attending: Family Medicine | Admitting: Family Medicine

## 2020-10-26 ENCOUNTER — Other Ambulatory Visit: Payer: Self-pay

## 2020-10-26 DIAGNOSIS — Z20822 Contact with and (suspected) exposure to covid-19: Secondary | ICD-10-CM | POA: Insufficient documentation

## 2020-10-26 DIAGNOSIS — Z79899 Other long term (current) drug therapy: Secondary | ICD-10-CM | POA: Diagnosis not present

## 2020-10-26 DIAGNOSIS — J40 Bronchitis, not specified as acute or chronic: Secondary | ICD-10-CM | POA: Diagnosis present

## 2020-10-26 LAB — SARS CORONAVIRUS 2 (TAT 6-24 HRS): SARS Coronavirus 2: NEGATIVE

## 2020-10-26 MED ORDER — ALBUTEROL SULFATE HFA 108 (90 BASE) MCG/ACT IN AERS: 2.0000 | INHALATION_SPRAY | Freq: Four times a day (QID) | RESPIRATORY_TRACT | 2 refills | Status: DC | PRN

## 2020-10-26 MED ORDER — AMOXICILLIN 875 MG PO TABS
875.0000 mg | ORAL_TABLET | Freq: Two times a day (BID) | ORAL | 0 refills | Status: DC
Start: 2020-10-26 — End: 2020-11-05

## 2020-10-26 NOTE — ED Triage Notes (Signed)
Pt presents with productive cough, sob and nasal congestion xs 3 weeks.

## 2020-10-26 NOTE — Discharge Instructions (Addendum)
Return if any problems.

## 2020-10-26 NOTE — ED Provider Notes (Signed)
[place]    CSN: [id number] Arrival date & time: 10/26/20  1445      History   Chief Complaint Chief Complaint  Patient presents with  . Cough  . Shortness of Breath    HPI [name] is a 59 y.o. male.   The history is provided by the patient. No language interpreter was used.  Cough Cough characteristics:  Productive Sputum characteristics:  Nondescript Severity:  Moderate Onset quality:  Gradual Timing:  Constant Progression:  Worsening Chronicity:  New Context: sick contacts   Relieved by:  Nothing Worsened by:  Nothing Associated symptoms: shortness of breath   Risk factors: recent infection   Shortness of Breath Associated symptoms: cough   Pt complains of coughing up phelgm.  Pt reports he has a lot of congestion.  Pt denies any covid risk.   Past Medical History:  Diagnosis Date  . Acute gastric ulcer with hemorrhage   . Acute respiratory failure ([place]) 05/11/2019  . Aortic valve disease    a. 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 Atrial flutter ([place])   . Benign hypertensive heart and renal disease   . Cardiogenic shock ([place])   . Chronic combined systolic and diastolic CHF (congestive heart failure) ([place])   . Chronic kidney disease (CKD), stage III (moderate) ([facility])   . COVID-19   . Diabetes mellitus ([place])   . Essential hypertension 11/19/2014  . Gout   . Hx of colonic polyps 11/11/2019  . Hyperlipidemia   . Insomnia   . Iron deficiency anemia   . Murmur   . Noncompliance   . Obesity   . Pseudomonas pneumonia ([place]) 05/16/2019  . Pulmonary hypertension [facility])     Patient Active Problem List   Diagnosis Date Noted  . Abdominal distention 04/06/2020  . Generalized abdominal pain 04/06/2020  . Change in bowel habits 04/06/2020  . Hx of colonic polyps 11/11/2019  . Anemia, iron deficiency 11/05/2019  . LV (left ventricular) mural thrombus without MI ([place])  06/19/2019  . Internal and external bleeding hemorrhoids   . S/P AVR 04/05/2015  . Aortic stenosis 03/29/2015  . Bicuspid aortic valve 03/29/2015  . Acute systolic CHF (congestive heart failure) ([place]) 03/27/2015  . Aortic insufficiency 11/19/2014  . Congestive dilated cardiomyopathy ([place]) 11/19/2014  . Essential hypertension 11/19/2014  . 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 Chronic systolic CHF (congestive heart failure) ([place]) 11/19/2014  . SOB (shortness of breath) 11/19/2014  . Atrial flutter ([place]) 11/19/2014    Past Surgical History:  Procedure Laterality Date  . AORTIC VALVE REPLACEMENT N/A 04/05/2015   Procedure: AORTIC VALVE REPLACEMENT (AVR) using a 69mm Edwards Aortic Magna Ease Valve ;  Surgeon: [name], MD;  Location: [place];  Service: Open Heart Surgery;  Laterality: N/A;  . COLONOSCOPY WITH PROPOFOL N/A 11/05/2019   Procedure: COLONOSCOPY WITH PROPOFOL;  Surgeon: [name], MD;  [facility]OS[facility];  Service: Endoscopy;  Laterality: N/A;  . ESOPHAGOGASTRODUODENOSCOPY (EGD) WITH PROPOFOL N/A 05/19/2019   Procedure: ESOPHAGOGASTRODUODENOSCOPY (EGD) WITH PROPOFOL;  Surgeon: [name], MD;  Location: [place];  Service: Gastroenterology;  Laterality: N/A;  . HEMOSTASIS CLIP PLACEMENT  05/19/2019   Procedure: HEMOSTASIS CLIP PLACEMENT;  Surgeon: [name], MD;  Location: [facility] ENDOSCOPY;  Service: Gastroenterology;;  . LEFT AND RIGHT HEART CATHETERIZATION WITH CORONARY ANGIOGRAM N/A 11/27/2014   Procedure: LEFT AND RIGHT HEART CATHETERIZATION WITH CORONARY ANGIOGRAM;  Surgeon: [name], MD;  Location: [facility] CATH LAB;  Service: Cardiovascular;  Laterality: N/A;  . MAZE N/A 04/05/2015   Procedure: MAZE;  Surgeon: [name], MD;  Location: [place];  Service: Open Heart Surgery;  Laterality: N/A;  . POLYPECTOMY  11/05/2019   Procedure: POLYPECTOMY;  Surgeon: [name], MD;  [facility] ENDOS[facility];  Service: Endoscopy;;  .  REPLACEMENT ASCENDING AORTA N/A 04/05/2015   Procedure: REPLACEMENT ASCENDING AORTA with a 90mm Hemashield Platinum Graft;  Surgeon: [name], MD;  Location: [place];  Service: Open Heart Surgery;  Laterality: N/A;  . TEE WITHOUT CARDIOVERSION N/A 04/05/2015   Procedure: TRANSESOPHAGEAL ECHOCARDIOGRAM (TEE);  Surgeon: [name], MD;  Location: [place];  Service: Open Heart Surgery;  Laterality: N/A;       Home Medications    Prior to Admission medications   Medication Sig Start Date End Date Taking? Authorizing Provider  albuterol (VENTOLIN HFA) 108 (90 Base) MCG/ACT inhaler Inhale 2 puffs into the lungs every 6 (six) hours as needed for wheezing or shortness of breath. 10/26/20   Fransico Meadow, PA-C  allopurinol (ZYLOPRIM) 100 MG tablet Take 1 tablet (100 mg total) by mouth daily. Patient taking differently: Take 100 mg by mouth daily as needed (gout).  09/22/19   Bensimhon, Shaune Pascal, MD  AMBULATORY NON FORMULARY MEDICATION Take 1 tablet by mouth 2 (two) times daily. Swiss Kriss Herbal Laxative tabs    [provider]  amoxicillin (AMOXIL) 875 MG tablet Take 1 tablet (875 mg total) by mouth 2 (two) times daily. 10/26/20   Fransico Meadow, PA-C  ferrous sulfate 325 (65 FE) MG EC tablet Take 325 mg by mouth daily.    [provider]  furosemide (LASIX) 40 MG tablet TAKE ONE TABLET BY MOUTH DAILY **PLEASE CALL FOR AN APPOINTMENT [phone]** 10/04/20   Bensimhon, Shaune Pascal, MD  hydrALAZINE (APRESOLINE) 25 MG tablet Take two in the AM and one the PM 11/21/19   Bensimhon, Shaune Pascal, MD  isosorbide mononitrate (IMDUR) 60 MG 24 hr tablet TAKE ONE TABLET BY MOUTH DAILY **PLEASE CALL FOR AN APPOINTMENT [phone]** 10/04/20   Bensimhon, Shaune Pascal, MD  Multiple Vitamins-Minerals (EMERGEN-C IMMUNE) PACK Take 1 packet by mouth daily.    [provider]  Naphazoline-Glycerin (REDNESS RELIEF OP) Place 1 drop into both eyes daily as needed (redness).    [provider]  pantoprazole (PROTONIX) 40 MG tablet TAKE ONE TABLET BY MOUTH DAILY 07/09/20   [name], MD  sacubitril-valsartan (ENTRESTO) 97-103 MG Take 1 tablet by mouth 2 (two) times daily. 05/25/20   Bensimhon, Shaune Pascal, MD  spironolactone (ALDACTONE) 25 MG tablet Take 1 tablet (25 mg total) by mouth daily. 11/21/19   Bensimhon, Shaune Pascal, MD  warfarin (COUMADIN) 5 MG tablet TAKE ONE AND HALF TO TWO TABLETS BY MOUTH DAILY AS DIRECTED BY COUMADIN CLINIC 10/01/20   Sueanne Margarita, MD    Family History Family History  Problem Relation Age of Onset  . Other Mother        MVA  . Liver disease Cousin        cirrhosis; alcohol related  . Heart attack Neg Hx   . Colon cancer Neg Hx   . Esophageal cancer Neg Hx   . Stomach cancer Neg Hx   . Pancreatic cancer Neg Hx     Social History Social History   Tobacco Use  . Smoking status: Never Smoker  . Smokeless  tobacco: Never Used  Vaping Use  . Vaping Use: Never used  Substance Use Topics  . Alcohol use: Yes    Comment: 5 monthly  . Drug use: Never     Allergies   Patient has no known allergies.   Review of Systems Review of Systems  Respiratory: Positive for cough and shortness of breath.   All other systems reviewed and are negative.    Physical Exam Triage Vital Signs ED Triage Vitals  Enc Vitals Group     BP 10/26/20 1548 (!) 132/92     Pulse Rate 10/26/20 1548 65     Resp 10/26/20 1548 19     Temp 10/26/20 1548 98.9 F (37.2 C)     Temp Source 10/26/20 1548 Oral     SpO2 10/26/20 1548 96 %     Weight --      Height --      Head Circumference --      Peak Flow --      Pain Score 10/26/20 1544 0     Pain Loc --      Pain Edu? --      Excl. in [place]? --    No data found.  Updated Vital Signs BP (!) 132/92 (BP Location: Right Arm)   Pulse 65   Temp 98.9 F (37.2 C) (Oral)   Resp 19   SpO2 96%   Visual Acuity Right Eye Distance:   Left Eye Distance:   Bilateral Distance:    Right Eye Near:   Left Eye  Near:    Bilateral Near:     Physical Exam Vitals and nursing note reviewed.  Constitutional:      Appearance: He is well-developed.  HENT:     Head: Normocephalic and atraumatic.  Eyes:     Conjunctiva/sclera: Conjunctivae normal.  Cardiovascular:     Rate and Rhythm: Normal rate and regular rhythm.     Heart sounds: Murmur heard.   Pulmonary:     Effort: Pulmonary effort is normal. No respiratory distress.     Breath sounds: Examination of the right-lower field reveals rhonchi. Examination of the left-lower field reveals rhonchi. Rhonchi present.  Abdominal:     Palpations: Abdomen is soft.     Tenderness: There is no abdominal tenderness.  Musculoskeletal:        General: Normal range of motion.     Cervical back: Neck supple.  Skin:    General: Skin is warm and dry.  Neurological:     General: No focal deficit present.     Mental Status: He is alert.  Psychiatric:        Mood and Affect: Mood normal.      UC Treatments / Results  Labs (all labs ordered are listed, but only abnormal results are displayed) Labs Reviewed  SARS CORONAVIRUS 2 (TAT 6-24 HRS)    EKG   Radiology No results found.  Procedures Procedures (including critical care time)  Medications Ordered in UC Medications - No data to display  Initial Impression / Assessment and Plan / UC Course  I have reviewed the triage vital signs and the nursing notes.  Pertinent labs & imaging results that were available during my care of the patient were reviewed by me and considered in my medical decision making (see chart for details).     MDM:  covid pending.  Pt has had a cough and congestion for over a week.  Pt requesting antibiotic treatment.  Pt has multiple  comorbidities.  I advised pt I will treat with antibiotic,  If covid is positive he will need MAB treatment  Final Clinical Impressions(s) / UC Diagnoses   Final diagnoses:  Bronchitis     Discharge Instructions     Return if any  problems.    ED Prescriptions    Medication Sig Dispense Auth. Provider   albuterol (VENTOLIN HFA) 108 (90 Base) MCG/ACT inhaler Inhale 2 puffs into the lungs every 6 (six) hours as needed for wheezing or shortness of breath. 8 g Jarelle Ates K, PA-C   amoxicillin (AMOXIL) 875 MG tablet Take 1 tablet (875 mg total) by mouth 2 (two) times daily. 20 tablet Fransico Meadow, [place]     PDMP not reviewed this encounter.   Fransico Meadow, [place] 10/26/20 1935

## 2020-11-01 ENCOUNTER — Other Ambulatory Visit (HOSPITAL_COMMUNITY): Payer: Self-pay | Admitting: Internal Medicine

## 2020-11-03 ENCOUNTER — Telehealth (HOSPITAL_COMMUNITY): Payer: Self-pay | Admitting: Licensed Clinical Social Worker

## 2020-11-03 NOTE — Telephone Encounter (Signed)
CSW received call from pt requesting help with getting Entresto from his pharmacy.  States that when he called them they were having issues with his PAN foundation and couldn't fill it for $0 copay.  CSW confirmed that PAN foundation is still active and has around $350 left in funds.  CSW called pt pharmacy who states there are not issues with PAN foundation on their end but that pt medicare information is outdated.  CSW called pt to inform and he states he did get a new card in the mail- he will call pharmacy to supply with new insurance information  Will continue to follow and assist as needed  Jorge Ny, [place] Worker [facility] Desk#: [phone] Cell#: [phone]

## 2020-11-04 ENCOUNTER — Telehealth (HOSPITAL_COMMUNITY): Payer: Self-pay | Admitting: Licensed Clinical Social Worker

## 2020-11-04 NOTE — Telephone Encounter (Addendum)
Pt called CSW back and informed that pharmacy saying his [employer] insurance doesn't start until 12/18/20 and that hey have no active coverage on file for him.  CSW had Patient Advocate/Pharmacy Tech look up pt insurance and she cannot find coverage for him at this time- appears as if he does not have prescription coverage until January.  Informed him we would need to apply for patient assistance through manufacturer to try and get medications for free.  Will continue to follow and assist as needed  Jorge Ny, [facility] Desk#: [phone] Cell#: [phone]

## 2020-11-05 ENCOUNTER — Telehealth (HOSPITAL_COMMUNITY): Payer: Self-pay | Admitting: Licensed Clinical Social Worker

## 2020-11-05 ENCOUNTER — Ambulatory Visit (INDEPENDENT_AMBULATORY_CARE_PROVIDER_SITE_OTHER): Payer: Medicare Other | Admitting: Pharmacist

## 2020-11-05 ENCOUNTER — Other Ambulatory Visit: Payer: Self-pay

## 2020-11-05 DIAGNOSIS — Q231 Congenital insufficiency of aortic valve: Secondary | ICD-10-CM | POA: Diagnosis not present

## 2020-11-05 DIAGNOSIS — Z5181 Encounter for therapeutic drug level monitoring: Secondary | ICD-10-CM | POA: Diagnosis not present

## 2020-11-05 DIAGNOSIS — I4892 Unspecified atrial flutter: Secondary | ICD-10-CM | POA: Diagnosis not present

## 2020-11-05 DIAGNOSIS — I240 Acute coronary thrombosis not resulting in myocardial infarction: Secondary | ICD-10-CM | POA: Diagnosis not present

## 2020-11-05 LAB — POCT INR: INR: 1.8 — AB (ref 2.0–3.0)

## 2020-11-05 NOTE — Patient Instructions (Signed)
Description   Take 2 tablets tomorrow and Sunday, then resume same dosage 1.5 tablets daily except for 2 tablets on Mondays, Wednesday and Fridays. Be consistent with your greens. Get INR rechecked in 6 weeks, per pt request. [phone].

## 2020-11-05 NOTE — Telephone Encounter (Signed)
Pt came by clinic to sign [employer] app for [facility] patient assistance- patient application complete- will fax in for review once MD portion is done  CSW provided samples of entresto for patient while we await determination from [place], [facility] Desk#: [phone] Cell#: [phone]

## 2020-11-08 ENCOUNTER — Telehealth (HOSPITAL_COMMUNITY): Payer: Self-pay | Admitting: Licensed Clinical Social Worker

## 2020-11-08 NOTE — Telephone Encounter (Signed)
Completed [place] for [employer] assistance submitted for review- fax confirmation received- awaiting determination  Jorge Ny, [facility] Desk#: [phone] Cell#: [phone]

## 2020-11-10 ENCOUNTER — Encounter (HOSPITAL_COMMUNITY): Payer: Medicare Other | Admitting: Internal Medicine

## 2020-11-19 ENCOUNTER — Other Ambulatory Visit (HOSPITAL_COMMUNITY): Payer: Self-pay | Admitting: Internal Medicine

## 2020-11-19 ENCOUNTER — Other Ambulatory Visit: Payer: Self-pay | Admitting: Cardiology

## 2020-11-25 ENCOUNTER — Telehealth (HOSPITAL_COMMUNITY): Payer: Self-pay | Admitting: Pharmacy Technician

## 2020-11-25 NOTE — Telephone Encounter (Signed)
Advanced Heart Failure Patient Advocate Encounter   Patient was approved to receive Entresto from [employer].  Patient ID: [id number]  Effective dates: 11/18/20 through 12/17/21  Charlann Boxer, CPhT

## 2020-12-08 ENCOUNTER — Other Ambulatory Visit (HOSPITAL_COMMUNITY): Payer: Self-pay | Admitting: Internal Medicine

## 2021-01-08 ENCOUNTER — Telehealth: Payer: Self-pay

## 2021-01-08 DIAGNOSIS — Z006 Encounter for examination for normal comparison and control in clinical research program: Secondary | ICD-10-CM

## 2021-01-09 NOTE — Progress Notes (Signed)
Advanced Heart Failure Clinic Note   Date:  01/09/2021   ID:  [name], DOB [birth year], MRN [id number]  Location: Home  Provider location: [facility] Type of Visit: Established patient  PCP:  Default, Provider, MD  Cardiologist:  Jonathon Him, MD Primary HF: [name]  Chief Complaint: Heart Failure follow-up   History of Present Illness:  [name] is a  60 y/o male with h/o systolic HF due to NICM, AS s/p AVR in 4/16, pulmonary HTN, chronic AF, CKD 3 and gout.  Admitted in 5/20 with respiratory failure due to HF exacerbation and pseudomonas PNA. He was treated with antibiotics and diuretics. Echo with EF 20-25% with apical clot. Also was in/out of AF/NSR. When in NSR had episodedsof heart block  Srtarted on anticoagulation, but he developed a GI bleed due to suspected stress ulcers.   Has seen Dr. Rayann Campbell in 8/20. Remains in AF. No further pauses. Not candidate for ablation or ICD at that time  Echo 9/20 EF 25% AVR stable. Moderate RV HK  Here for for routine f/u. Says for past 2 months has been having cough full of thick mucus. Seen by UC on 08/03/20 and 10/26/20. COVID negative and given albuterol and amoxicillin. CXR with mild vascular congestion. No benefit. No improvement. No having orthopnea and PND. + SOB with mild exertion. No edema. Has been complaint with meds. No fevers or chills. Weight stable.    Past Medical History:  Diagnosis Date  . Acute gastric ulcer with hemorrhage   . Acute respiratory failure ([place]) 05/11/2019  . Aortic valve disease    a. 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 Atrial flutter ([place])   . Benign hypertensive heart and renal disease   . Cardiogenic shock ([place])   . Chronic combined systolic and diastolic CHF (congestive heart failure) ([place])   . Chronic kidney disease (CKD), stage III (moderate) (HCC)   . COVID-19   . Diabetes mellitus  ([place])   . Essential hypertension 11/19/2014  . Gout   . Hx of colonic polyps 11/11/2019  . Hyperlipidemia   . Insomnia   . Iron deficiency anemia   . Murmur   . Noncompliance   . Obesity   . Pseudomonas pneumonia ([place]) 05/16/2019  . Pulmonary hypertension ([place])    Past Surgical History:  Procedure Laterality Date  . AORTIC VALVE REPLACEMENT N/A 04/05/2015   Procedure: AORTIC VALVE REPLACEMENT (AVR) using a 81mm Edwards Aortic Magna Ease Valve ;  Surgeon: [name], MD;  Location: [place];  Service: Open Heart Surgery;  Laterality: N/A;  . COLONOSCOPY WITH PROPOFOL N/A 11/05/2019   Procedure: COLONOSCOPY WITH PROPOFOL;  Surgeon: [name], MD;  [facility];  Service: Endoscopy;  Laterality: N/A;  . ESOPHAGOGASTRODUODENOSCOPY (EGD) WITH PROPOFOL N/A 05/19/2019   Procedure: ESOPHAGOGASTRODUODENOSCOPY (EGD) WITH PROPOFOL;  Surgeon: [name], MD;  Location: [place];  Service: Gastroenterology;  Laterality: N/A;  . HEMOSTASIS CLIP PLACEMENT  05/19/2019   Procedure: HEMOSTASIS CLIP PLACEMENT;  Surgeon: [name], MD;  Location: [facility] ENDOSCOPY;  Service: Gastroenterology;;  . LEFT AND RIGHT HEART CATHETERIZATION WITH CORONARY ANGIOGRAM N/A 11/27/2014   Procedure: LEFT AND RIGHT HEART CATHETERIZATION WITH CORONARY ANGIOGRAM;  Surgeon: [name], MD;  Location: [facility] C[facility] LAB;  Service: Cardiovascular;  Laterality: N/A;  . MAZE N/A 04/05/2015   Procedure: MAZE;  Surgeon: [name], MD;  Location: [facility]  OR;  Service: Open Heart Surgery;  Laterality: N/A;  . POLYPECTOMY  11/05/2019   Procedure: POLYPECTOMY;  Surgeon: [name], MD;  [facility] ENDOS[facility];  Service: Endoscopy;;  . REPLACEMENT ASCENDING AORTA N/A 04/05/2015   Procedure: REPLACEMENT ASCENDING AORTA with a 110mm Hemashield Platinum Graft;  Surgeon: [name], MD;  Location: [place];  Service: Open Heart Surgery;  Laterality: N/A;  . TEE WITHOUT CARDIOVERSION N/A 04/05/2015   Procedure:  TRANSESOPHAGEAL ECHOCARDIOGRAM (TEE);  Surgeon: [name], MD;  Location: [place];  Service: Open Heart Surgery;  Laterality: N/A;     Current Outpatient Medications  Medication Sig Dispense Refill  . furosemide (LASIX) 40 MG tablet Take 1 tablet (40 mg total) by mouth daily. 30 tablet 0  . isosorbide mononitrate (IMDUR) 60 MG 24 hr tablet Take 1 tablet (60 mg total) by mouth daily. 30 tablet 0  . sacubitril-valsartan (ENTRESTO) 97-103 MG Take 1 tablet by mouth 2 (two) times daily. 180 tablet 3  . warfarin (COUMADIN) 5 MG tablet TAKE 1 AND 1/2 TO 2 TABLETS BY MOUTH DAILY AS DIRECTED BY COUMADIN CLINIC 55 tablet 1   No current facility-administered medications for this encounter.    Allergies:   Patient has no known allergies.   Social History:  The patient  reports that he has never smoked. He has never used smokeless tobacco. He reports current alcohol use. He reports that he does not use drugs.   Family History:  The patient's family history includes Liver disease in his cousin; Other in his mother.   ROS:  Please see the history of present illness.   All other systems are personally reviewed and negative.   Vitals:   01/10/21 1224  BP: (!) 144/100  Pulse: 73  SpO2: 94%    Exam:   General:  Well appearing. + cough HEENT: normal Neck: supple. JVP 9-10Carotids 2+ bilat; no bruits. No lymphadenopathy or thryomegaly appreciated. Cor: PMI nondisplaced. Regular rate & rhythm. No rubs, gallops or murmurs. Lungs: clear Abdomen: obese soft, nontender, nondistended. No hepatosplenomegaly. No bruits or masses. Good bowel sounds. Extremities: no cyanosis, clubbing, rash, 2+ edema Neuro: alert & orientedx3, cranial nerves grossly intact. moves all 4 extremities w/o difficulty. Affect pleasant  Recent Labs: 04/16/2020: Creatinine, Ser 2.20  Personally reviewed   Wt Readings from Last 3 Encounters:  01/10/21 124.3 kg (274 lb)  08/03/20 122.5 kg (270 lb)  04/06/20 126.7 kg (279 lb  6.4 oz)    ECG: AF 74 bpm RBBB+ PVCs Personally reviewed  ASSESSMENT AND PLAN:  1. Productive cough and increasing SOB - I think this is mostly HF and volume overload but sputum is also a bit discolored.  - ReDS 35% - will repeat CXR - diurese as below - doxycycline 100 bid x 7 days 2.  Acute on chronic systolic HF with biventricular failure - Echo 12/19 EF 50% - Echo 05/11/19 EF 20-25% with moderately decreased RV function  - Due to NICM (coronaries normal in 2016). Suspect due to probable tachy-induced CM (Was in AFL in 140s on admit) - Echo 9/20 EF 25% - Volume statusappear elevated on lasix 40 daily - ReDS 35% - Will see if he is a candidate for At-Home trial, if not double lasix to 80 daily and give metolazone 2.5 x 2 days. (with kcl 40 daily extra). See back in 2-3 days - NYHA III -ContinueEntresto 97/103 - Continue Imdur 30  - Restart hydralazine 25 tid - Restart Spiro 25 daily -  Consider Farxiga in future - No b-blocker with sinus and AV node dysfunction - Needs re[peat echo  - If EF not improving will need to reconsider biv ICD. Dr. Rayann Campbell following  3. Persitent AFL/ AFIB with tachy-brady syndrome - Remains in AF. Rate control - has failed previous Maze. Not felt to be candidate for ablation - suspect this may be contributing to CM but rates no longer fast  - previous ECGs had  AV dissociation with accelerated junctional outpacing his sinus - Has been seen by EP. No need for pacer just yet but may need to consider to permit b-blocker. Followed by Dr. Rayann Campbell - Continue warfarin. No bleeding  4. CKD 3a - baseline creatinine 1.8 - last BMET in with creatinine 1.2 - repeat today  5. Bioprosthetic AVR in 4/16 - Reminded of SBE prophylaxis - due for repeat echo   6. 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 LV apical thrombus - on warfarin   8. GIB - had duodenal ulcers.  - colonoscopy 11/05/19 with Dr. Carlean Purl. 2 small polyps  Signed, Glori Bickers, MD  01/09/2021 4:39 PM  Advanced Heart Failure [facility] [address] Heart and [place] [address] [phone] (office) [phone] (fax)

## 2021-01-10 ENCOUNTER — Other Ambulatory Visit: Payer: Self-pay

## 2021-01-10 ENCOUNTER — Ambulatory Visit (HOSPITAL_COMMUNITY)
Admission: RE | Admit: 2021-01-10 | Discharge: 2021-01-10 | Disposition: A | Discharge status: Home / Self Care | Payer: Medicare Other | Source: Ambulatory Visit | Attending: Internal Medicine | Admitting: Internal Medicine

## 2021-01-10 ENCOUNTER — Encounter (HOSPITAL_COMMUNITY): Payer: Self-pay | Admitting: Internal Medicine

## 2021-01-10 VITALS — BP 144/100 | HR 73 | Wt 274.0 lb

## 2021-01-10 DIAGNOSIS — I495 Sick sinus syndrome: Secondary | ICD-10-CM | POA: Insufficient documentation

## 2021-01-10 DIAGNOSIS — Z79899 Other long term (current) drug therapy: Secondary | ICD-10-CM | POA: Insufficient documentation

## 2021-01-10 DIAGNOSIS — E1122 Type 2 diabetes mellitus with diabetic chronic kidney disease: Secondary | ICD-10-CM | POA: Diagnosis not present

## 2021-01-10 DIAGNOSIS — Z006 Encounter for examination for normal comparison and control in clinical research program: Secondary | ICD-10-CM

## 2021-01-10 DIAGNOSIS — I5082 Biventricular heart failure: Secondary | ICD-10-CM | POA: Insufficient documentation

## 2021-01-10 DIAGNOSIS — E785 Hyperlipidemia, unspecified: Secondary | ICD-10-CM | POA: Diagnosis not present

## 2021-01-10 DIAGNOSIS — I4892 Unspecified atrial flutter: Secondary | ICD-10-CM | POA: Diagnosis not present

## 2021-01-10 DIAGNOSIS — I272 Pulmonary hypertension, unspecified: Secondary | ICD-10-CM | POA: Diagnosis not present

## 2021-01-10 DIAGNOSIS — Z7901 Long term (current) use of anticoagulants: Secondary | ICD-10-CM | POA: Insufficient documentation

## 2021-01-10 DIAGNOSIS — N1831 Chronic kidney disease, stage 3a: Secondary | ICD-10-CM | POA: Insufficient documentation

## 2021-01-10 DIAGNOSIS — I5023 Acute on chronic systolic (congestive) heart failure: Secondary | ICD-10-CM | POA: Diagnosis not present

## 2021-01-10 DIAGNOSIS — R059 Cough, unspecified: Secondary | ICD-10-CM

## 2021-01-10 DIAGNOSIS — I428 Other cardiomyopathies: Secondary | ICD-10-CM | POA: Diagnosis not present

## 2021-01-10 DIAGNOSIS — I5042 Chronic combined systolic (congestive) and diastolic (congestive) heart failure: Secondary | ICD-10-CM | POA: Diagnosis present

## 2021-01-10 DIAGNOSIS — Z8711 Personal history of peptic ulcer disease: Secondary | ICD-10-CM | POA: Insufficient documentation

## 2021-01-10 DIAGNOSIS — N182 Chronic kidney disease, stage 2 (mild): Secondary | ICD-10-CM

## 2021-01-10 DIAGNOSIS — I4819 Other persistent atrial fibrillation: Secondary | ICD-10-CM | POA: Insufficient documentation

## 2021-01-10 DIAGNOSIS — Z952 Presence of prosthetic heart valve: Secondary | ICD-10-CM

## 2021-01-10 DIAGNOSIS — I130 Hypertensive heart and chronic kidney disease with heart failure and stage 1 through stage 4 chronic kidney disease, or unspecified chronic kidney disease: Secondary | ICD-10-CM | POA: Insufficient documentation

## 2021-01-10 DIAGNOSIS — Z8379 Family history of other diseases of the digestive system: Secondary | ICD-10-CM | POA: Insufficient documentation

## 2021-01-10 DIAGNOSIS — Z953 Presence of xenogenic heart valve: Secondary | ICD-10-CM | POA: Insufficient documentation

## 2021-01-10 DIAGNOSIS — I5022 Chronic systolic (congestive) heart failure: Secondary | ICD-10-CM

## 2021-01-10 LAB — CBC
HCT: 48.6 % (ref 39.0–52.0)
Hemoglobin: 15.8 g/dL (ref 13.0–17.0)
MCH: 27.1 pg (ref 26.0–34.0)
MCHC: 32.5 g/dL (ref 30.0–36.0)
MCV: 83.4 fL (ref 80.0–100.0)
Platelets: 257 10*3/uL (ref 150–400)
RBC: 5.83 MIL/uL — ABNORMAL HIGH (ref 4.22–5.81)
RDW: 15.2 % (ref 11.5–15.5)
WBC: 7.6 10*3/uL (ref 4.0–10.5)
nRBC: 0 % (ref 0.0–0.2)

## 2021-01-10 LAB — BASIC METABOLIC PANEL
Anion gap: 12 (ref 5–15)
BUN: 13 mg/dL (ref 6–20)
CO2: 25 mmol/L (ref 22–32)
Calcium: 9.1 mg/dL (ref 8.9–10.3)
Chloride: 101 mmol/L (ref 98–111)
Creatinine, Ser: 1.48 mg/dL — ABNORMAL HIGH (ref 0.61–1.24)
GFR, Estimated: 54 mL/min — ABNORMAL LOW (ref >60)
Glucose, Bld: 126 mg/dL — ABNORMAL HIGH (ref 70–99)
Potassium: 4 mmol/L (ref 3.5–5.1)
Sodium: 138 mmol/L (ref 135–145)

## 2021-01-10 LAB — MAGNESIUM: Magnesium: 2 mg/dL (ref 1.7–2.4)

## 2021-01-10 LAB — BRAIN NATRIURETIC PEPTIDE: B Natriuretic Peptide: 802 pg/mL — ABNORMAL HIGH (ref 0.0–100.0)

## 2021-01-10 MED ORDER — HYDRALAZINE HCL 25 MG PO TABS: 25.0000 mg | ORAL_TABLET | Freq: Three times a day (TID) | ORAL | 3 refills | Status: DC

## 2021-01-10 MED ORDER — DOXYCYCLINE HYCLATE 100 MG PO TABS: 100.0000 mg | ORAL_TABLET | Freq: Two times a day (BID) | ORAL | 0 refills | Status: DC

## 2021-01-10 MED ORDER — SPIRONOLACTONE 25 MG PO TABS: 25.0000 mg | ORAL_TABLET | Freq: Every day | ORAL | 3 refills | Status: DC

## 2021-01-10 MED ORDER — FUROSEMIDE 40 MG PO TABS: 40.0000 mg | ORAL_TABLET | Freq: Every day | ORAL | 4 refills | Status: DC

## 2021-01-10 MED ORDER — ISOSORBIDE MONONITRATE ER 60 MG PO TB24: 60.0000 mg | ORAL_TABLET | Freq: Every day | ORAL | 4 refills | Status: DC

## 2021-01-10 NOTE — Research (Signed)
  [employer] Protocol [id number] AT [facility] Pilot  Patient Global Assessment Visual Analog Scale (EQ-VAS)        Subject Number:   16-006  Date Completed:        24/JAN/2022_  Study Day:  Day 0       . We would like to know how good or bad your health is TODAY. [name] This scale is numbered 0 to 100. . 100 means the best health you can imagine.       0 means the worst health you can imagine. . Please let me know the number you feel indicated how your health is TODAY.    VAS SCORE - YOUR HEALTH TODAY : 60

## 2021-01-10 NOTE — Patient Instructions (Addendum)
Start Doxycline 100 mg (1 tablet) Twice daily for 10 days  Restart spironolactone 25 mg ( 1 tablet) daily  Restart Hydralazine 25 mg ( 1 tablet)  3 x a day  chest X-ray ordered today  Your physician has requested that you have an echocardiogram. Echocardiography is a painless test that uses sound waves to create images of your heart. It provides your doctor with information about the size and shape of your heart and how well your heart's chambers and valves are working. This procedure takes approximately one hour. There are no restrictions for this procedure.  Lab work done today we will call you with any abnormal results  Your physician recommends that you schedule a follow-up appointment for tomorrow Tuesday January 25,Thurday January 27 and Monday January 31  If you have any questions or concerns before your next appointment please send [place] a message through [place] or call our office at [phone].    TO LEAVE A MESSAGE FOR THE NURSE SELECT OPTION 2, PLEASE LEAVE A MESSAGE INCLUDING: . YOUR NAME . DATE OF BIRTH . CALL BACK NUMBER . REASON FOR CALL**this is important as we prioritize the call backs  Story AS LONG AS YOU CALL BEFORE 4:00 PM   At the [facility], you and your health needs are our priority. As part of our continuing mission to provide you with exceptional heart care, we have created designated Provider Care Teams. These Care Teams include your primary Cardiologist (physician) and Advanced Practice Providers (APPs- Physician Assistants and Nurse Practitioners) who all work together to provide you with the care you need, when you need it.   You may see any of the following providers on your designated Care Team at your next follow up: [name] Dr Glori Bickers . Dr Loralie Champagne . Darrick Grinder, NP . Lyda Jester, PA . Audry Riles, PharmD   Please be sure to bring in all your medications bottles to every  appointment.

## 2021-01-10 NOTE — Progress Notes (Signed)
ReDS Vest / Clip - 01/10/21 1300      ReDS Vest / Clip   Station Marker D    Ruler Value 37    ReDS Value Range Low volume    ReDS Actual Value 35

## 2021-01-10 NOTE — Research (Addendum)
At Home Informed Consent   Subject Name: [name]  Subject met inclusion and exclusion criteria.  The informed consent form, study requirements and expectations were reviewed with the subject and questions and concerns were addressed prior to the signing of the consent form.  The subject verbalized understanding of the trial requirements.  The subject agreed to participate in the AT Home trial and signed the informed consent at 1358 on 24/JAN/2022.  The informed consent was obtained prior to performance of any protocol-specific procedures for the subject.  A copy of the signed informed consent was given to the subject and a copy was placed in the subject's medical record.   Sherlyn Lees

## 2021-01-10 NOTE — Research (Signed)
  Avoiding Treatment in the Hospital with Furoscix for the Management of Congestion in Heart Failure - A Pilot Study  AT HOME-HF Pilot       [x]  Informed Consent (done before any of the following procedures were completed)  [x]  Limited Physical Exam   [x]  NYHA Classification  [x]  Eligibility Criteria  [x]  Medical History and Subject Demographics   [x]  Administration of the Composite Congestion Score (CCS)  [x]  Administration of the 5-Point Current Dyspnea Score  []  Weight & Height  [x]  Vital Signs  [x]  % Lung Fluid Measurement (ReDS)  [x]  Eligibility Criteria Blood work performed along with NT-ProBNP   []  Urine Pregnancy Test on females of childbearing potential  [x]  Administration of the [employer] Cardiomyopathy Questionnaire (KCCQ-12)  [x]  Administration of the patient global assessment visual analog scale (VAS)  [x]  Administration of the six minute walk test  [x]  Assessment of any Adverse Events  [x]  Review of concomitant medications   [x]  Scheduling next study visit

## 2021-01-10 NOTE — Research (Signed)
                6 Minute Walk Was the 6 minute walk performed:    ?[x]  Yes ?  [] No (Complete Protocol Deviation)  Vitals Before 6 Minute Walk Systolic Blood Pressure (mmHg) Diastolic Blood Pressure (mmHg) Heart Rate (BPM) Respiration Rate (BPM)   152 78 68 16  Vitals After 6 Minute Walk Systolic Blood Pressure (mmHg) Diastolic Blood Pressure (mmHg) Heart Rate (BPM) Respiration Rate (BPM)   164 127 85 22  Measured Before 6 Minute Walk Borg Scale Level of Shortness of breath Grade 0.5- very, very slight  Borg Scale Level of Fatigue Grade 0- none at all   Measured After 6 Minute Walk Borg Scale Level of Shortness of breath _Grade 3 Moderate  Borg Scale Level of Fatigue  Grade 3 Moderate   Distance covered in 6 minutes ??? 76.2 meters   Did the subject stop or pause before 6 minutes  ? [x] Yes (Complete Below) ?  [] No   Reason for stopping Reason Response If Yes, Comments   Shortness of breath (HF-related) ? Yes[x]   ? No[]     Light headedness (HF-related) ? Yes[]   ? No[x]     Fatigue (HF-related) ? Yes[x]   ? No[]     Other HF-related symptom ? Yes[]   ? No[x]     Non-HF symptom(s) ? Yes[]   ? No[x]

## 2021-01-10 NOTE — Research (Signed)
     AT HOME-HF Cardiopulmonary Examination & Physical Examination Summary    Cardiopulmonary Exam  Was the CP Exam Performed? [x] Yes (Complete Below) [] No (Complete Protocol Deviation)      Heart Sounds  S3: [x] Absent [] Present   S4: [x] Absent [] Present  Murmur  Murmur: [] Yes [x] No   If Yes:  [] Systolic [] Diastolic   If yes select grade: [] I/VI [] II/VI [] III/VI [] IV/VI [] V/VI [] VI/VI   Chest Sounds  Rales: [] Yes [x] No   If yes check one: [] ? 1/3 of lung fields full [] 1/2 of lung fields full [] > 1/2 of lung fields full  If yes check one: [] Left [] Right [] Bilateral    Edema   Edema (pedal): [] Absent [] Trace [] 1+ (slight) [x] 2+ (moderate) [] 3+ (severe)    Hepatomegaly  Hepatomegaly: [x] None [] < 2 cm [] 2-4 cm [] > 4 cm  Peripheral Pulses  Peripheral Pulses: [x] Normal [] Abnormal   If Abnormal, explain:             Physical Exam   Was the physical Exam Performed?  [x] Yes        [] No (complete protocol deviation)    . Lungs/Chest?     [x] Normal        [] Abnormal        [] Not Done  If abnormal, was it clinically significant?     [] Yes   [] No       . Dermatologic (Abdominal Area):   [x] Normal        [] Abnormal        [] Not Done   If abnormal, was it clinically significant?  [] Yes   [] No       . Periphery:     [x] Normal          [] Abnormal         [] Not Done   If abnormal, was it clinically significant?  [] Yes    [] No      . Other Body System Examined?  [] Yes        [x] No          New York Heart Association (NYHA) Heart Failure Classification Indicate the subject's current NYHA Classification at the present time (check only one) Was the NYHA Performed:    ?[x] Yes  ?  []No (Complete Protocol Deviation)     []  Class I:  Patients have cardiac disease but without the resulting limitations of physical activity. Ordinary physical activity does  not cause undue fatigue, palpitation, dyspnea or anginal pain.     []  Class II:    Patients have cardiac disease resulting in slight limitation of physical activity. They are comfortable at rest. Ordinary physical activity results in fatigue, palpitation, dyspnea or anginal pain.     [x]  Class III:  Patients have cardiac disease resulting in marked limitation of physical activity. They are comfortable at rest. Less than ordinary physical activity causes fatigue, palpitation, dyspnea or anginal pain.     []  Class IV: Patients have cardiac disease resulting in inability to carry on any physical activity without discomfort. Symptoms of cardiac insufficiency or of the anginal syndrome may be present even at rest. If any physical activity is undertaken, discomfort is increased.    

## 2021-01-10 NOTE — Addendum Note (Signed)
Encounter addended by: Stanford Scotland, RN on: 01/10/2021 3:27 PM  Actions taken: Clinical Note Signed

## 2021-01-10 NOTE — Progress Notes (Signed)
Advanced Heart Failure Clinic Note   Date:  01/10/2021   ID:  [name], DOB 08-09-1961, MRN [id number]  Location: Home  Provider location: [facility] Type of Visit: Established patient  PCP:  Default, Provider, MD  Cardiologist:  [name], MD Primary HF: [place]  Chief Complaint: Heart Failure.    History of Present Illness: [name] is a  60 y/o male with h/o systolic HF due to NICM, AS s/p AVR in 4/16, pulmonary HTN, chronic AF, CKD 3 and gout.  Admitted in 5/20 with respiratory failure due to HF exacerbation and pseudomonas PNA. He was treated with antibiotics and diuretics. Echo with EF 20-25% with apical clot. Also was in/out of AF/NSR. When in NSR had episodedsof heart block  Srtarted on anticoagulation, but he developed a GI bleed due to suspected stress ulcers.   Has seen Dr. [name] in 8/20. Remains in AF. No further pauses. Not candidate for ablation or ICD at that time  Echo 9/20 EF 25% AVR stable. Moderate RV HK  Referred by Dr Haroldine Laws for the At [facility] trial. Reds Clip 35%. SOB with mild exertion. No fever or chills. Weight at home stable. Has been out of hydralazine.     Past Medical History:  Diagnosis Date  . Acute gastric ulcer with hemorrhage   . Acute respiratory failure ([place]) 05/11/2019  . Aortic valve disease    a. 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 Atrial flutter ([place])   . Benign hypertensive heart and renal disease   . Cardiogenic shock ([place])   . Chronic combined systolic and diastolic CHF (congestive heart failure) ([place])   . Chronic kidney disease (CKD), stage III (moderate) (HCC)   . COVID-19   . Diabetes mellitus ([place])   . Essential hypertension 11/19/2014  . Gout   . Hx of colonic polyps 11/11/2019  . Hyperlipidemia   . Insomnia   . Iron deficiency anemia   . Murmur   . Noncompliance   . Obesity   . Pseudomonas pneumonia  ([place]) 05/16/2019  . Pulmonary hypertension ([place])    Past Surgical History:  Procedure Laterality Date  . AORTIC VALVE REPLACEMENT N/A 04/05/2015   Procedure: AORTIC VALVE REPLACEMENT (AVR) using a 36mm Edwards Aortic Magna Ease Valve ;  Surgeon: [name], MD;  Location: [place];  Service: Open Heart Surgery;  Laterality: N/A;  . COLONOSCOPY WITH PROPOFOL N/A 11/05/2019   Procedure: COLONOSCOPY WITH PROPOFOL;  Surgeon: [name], MD;  [facility];  Service: Endoscopy;  Laterality: N/A;  . ESOPHAGOGASTRODUODENOSCOPY (EGD) WITH PROPOFOL N/A 05/19/2019   Procedure: ESOPHAGOGASTRODUODENOSCOPY (EGD) WITH PROPOFOL;  Surgeon: [name], MD;  Location: [place];  Service: Gastroenterology;  Laterality: N/A;  . HEMOSTASIS CLIP PLACEMENT  05/19/2019   Procedure: HEMOSTASIS CLIP PLACEMENT;  Surgeon: [name], MD;  Location: [facility] ENDOSCOPY;  Service: Gastroenterology;;  . LEFT AND RIGHT HEART CATHETERIZATION WITH CORONARY ANGIOGRAM N/A 11/27/2014   Procedure: LEFT AND RIGHT HEART CATHETERIZATION WITH CORONARY ANGIOGRAM;  Surgeon: [name], MD;  Location: [facility] CATH LAB;  Service: Cardiovascular;  Laterality: N/A;  . MAZE N/A 04/05/2015   Procedure: MAZE;  Surgeon: [name], MD;  Location: [place];  Service: Open Heart Surgery;  Laterality: N/A;  . POLYPECTOMY  11/05/2019   Procedure: POLYPECTOMY;  Surgeon: [name], MD;  [facility];  Service: Endoscopy;;  . REPLACEMENT ASCENDING  AORTA N/A 04/05/2015   Procedure: REPLACEMENT ASCENDING AORTA with a 66mm Hemashield Platinum Graft;  Surgeon: [name], MD;  Location: [place];  Service: Open Heart Surgery;  Laterality: N/A;  . TEE WITHOUT CARDIOVERSION N/A 04/05/2015   Procedure: TRANSESOPHAGEAL ECHOCARDIOGRAM (TEE);  Surgeon: [name], MD;  Location: [place];  Service: Open Heart Surgery;  Laterality: N/A;     Current Outpatient Medications  Medication Sig Dispense Refill  . doxycycline  (VIBRA-TABS) 100 MG tablet Take 1 tablet (100 mg total) by mouth 2 (two) times daily. 20 tablet 0  . hydrALAZINE (APRESOLINE) 25 MG tablet Take 1 tablet (25 mg total) by mouth 3 (three) times daily. 270 tablet 3  . sacubitril-valsartan (ENTRESTO) 97-103 MG Take 1 tablet by mouth 2 (two) times daily. 180 tablet 3  . spironolactone (ALDACTONE) 25 MG tablet Take 1 tablet (25 mg total) by mouth daily. 90 tablet 3  . warfarin (COUMADIN) 5 MG tablet TAKE 1 AND 1/2 TO 2 TABLETS BY MOUTH DAILY AS DIRECTED BY COUMADIN CLINIC 55 tablet 1  . furosemide (LASIX) 40 MG tablet Take 1 tablet (40 mg total) by mouth daily. 60 tablet 4  . isosorbide mononitrate (IMDUR) 60 MG 24 hr tablet Take 1 tablet (60 mg total) by mouth daily. 60 tablet 4   No current facility-administered medications for this encounter.    Allergies:   Patient has no known allergies.   Social History:  The patient  reports that he has never smoked. He has never used smokeless tobacco. He reports current alcohol use. He reports that he does not use drugs.   Family History:  The patient's family history includes Liver disease in his cousin; Other in his mother.   ROS:  Please see the history of present illness.   All other systems are personally reviewed and negative.   Vitals:   01/10/21 1224  BP: (!) 144/100  Pulse: 73  SpO2: 94%    Exam:   General:   No resp difficulty HEENT: normal Neck: supple. JVP  9-10. Carotids 2+ bilat; no bruits. No lymphadenopathy or thryomegaly appreciated. Cor: PMI nondisplaced. Regular rate & rhythm. No rubs, gallops or murmurs. Lungs: clear Abdomen: soft, nontender, nondistended. No hepatosplenomegaly. No bruits or masses. Good bowel sounds. Extremities: no cyanosis, clubbing, rash, R and LLE 1-2+ edema Neuro: alert & orientedx3, cranial nerves grossly intact. moves all 4 extremities w/o difficulty. Affect pleasant  Recent Labs: 01/10/2021: BUN 13; Creatinine, Ser 1.48; Hemoglobin 15.8; Platelets  257; Potassium 4.0; Sodium 138  Personally reviewed   Wt Readings from Last 3 Encounters:  01/10/21 124.3 kg (274 lb)  08/03/20 122.5 kg (270 lb)  04/06/20 126.7 kg (279 lb 6.4 oz)      ASSESSMENT AND PLAN:  1.  Acute on chronic systolic HF with biventricular failure - Echo 12/19 EF 50% - Echo 05/11/19 EF 20-25% with moderately decreased RV function  - Due to NICM (coronaries normal in 2016). Suspect due to probable tachy-induced CM (Was in AFL in 140s on admit) - Echo 9/20 EF 25% - Reds Clip 35%.  - NYHA III. Volume overloaded.  -Consent for At Home. Would like to give 3 doses of subcutaneous lasix.  -ContinueEntresto 97/103 - Continue Imdur 30  - Continue  hydralazine 25 tid - Continue Spiro 25 daily - Consider Farxiga in future - No b-blocker with sinus and AV node dysfunction - Needs repeat echo  - If EF not improving will need to reconsider biv ICD. Dr.  Allred following  2. Persitent AFL/ AFIB with tachy-brady syndrome - Remains in AF. Rate control - has failed previous Maze. Not felt to be candidate for ablation - suspect this may be contributing to CM but rates no longer fast  - previous ECGs had  AV dissociation with accelerated junctional outpacing his sinus - Has been seen by EP. No need for pacer just yet but may need to consider to permit b-blocker. Followed by Dr. [name] - Continue warfarin. No bleeding  Follow up tomorrow to assess response. Check labs.   Jeanmarie Hubert, NP  01/10/2021 2:12 PM  Advanced Heart Failure [facility] [address] Heart and [place] [address] [phone] (office) [phone] (fax)

## 2021-01-11 ENCOUNTER — Ambulatory Visit (HOSPITAL_COMMUNITY)
Admission: RE | Admit: 2021-01-11 | Discharge: 2021-01-11 | Disposition: A | Discharge status: Home / Self Care | Payer: Medicare Other | Source: Ambulatory Visit | Attending: Family Medicine | Admitting: Family Medicine

## 2021-01-11 ENCOUNTER — Encounter (HOSPITAL_COMMUNITY): Payer: Self-pay

## 2021-01-11 VITALS — BP 131/86 | HR 71 | Wt 268.0 lb

## 2021-01-11 DIAGNOSIS — M109 Gout, unspecified: Secondary | ICD-10-CM | POA: Diagnosis not present

## 2021-01-11 DIAGNOSIS — I428 Other cardiomyopathies: Secondary | ICD-10-CM | POA: Insufficient documentation

## 2021-01-11 DIAGNOSIS — I5082 Biventricular heart failure: Secondary | ICD-10-CM | POA: Diagnosis not present

## 2021-01-11 DIAGNOSIS — Z7182 Exercise counseling: Secondary | ICD-10-CM | POA: Insufficient documentation

## 2021-01-11 DIAGNOSIS — Z006 Encounter for examination for normal comparison and control in clinical research program: Secondary | ICD-10-CM

## 2021-01-11 DIAGNOSIS — I272 Pulmonary hypertension, unspecified: Secondary | ICD-10-CM | POA: Diagnosis not present

## 2021-01-11 DIAGNOSIS — I420 Dilated cardiomyopathy: Secondary | ICD-10-CM | POA: Diagnosis not present

## 2021-01-11 DIAGNOSIS — Z6837 Body mass index (BMI) 37.0-37.9, adult: Secondary | ICD-10-CM | POA: Insufficient documentation

## 2021-01-11 DIAGNOSIS — R0683 Snoring: Secondary | ICD-10-CM | POA: Diagnosis not present

## 2021-01-11 DIAGNOSIS — Z7901 Long term (current) use of anticoagulants: Secondary | ICD-10-CM | POA: Diagnosis not present

## 2021-01-11 DIAGNOSIS — E669 Obesity, unspecified: Secondary | ICD-10-CM | POA: Insufficient documentation

## 2021-01-11 DIAGNOSIS — I495 Sick sinus syndrome: Secondary | ICD-10-CM | POA: Insufficient documentation

## 2021-01-11 DIAGNOSIS — I513 Intracardiac thrombosis, not elsewhere classified: Secondary | ICD-10-CM | POA: Diagnosis not present

## 2021-01-11 DIAGNOSIS — I4819 Other persistent atrial fibrillation: Secondary | ICD-10-CM | POA: Diagnosis not present

## 2021-01-11 DIAGNOSIS — Z8711 Personal history of peptic ulcer disease: Secondary | ICD-10-CM | POA: Insufficient documentation

## 2021-01-11 DIAGNOSIS — E1122 Type 2 diabetes mellitus with diabetic chronic kidney disease: Secondary | ICD-10-CM | POA: Insufficient documentation

## 2021-01-11 DIAGNOSIS — I130 Hypertensive heart and chronic kidney disease with heart failure and stage 1 through stage 4 chronic kidney disease, or unspecified chronic kidney disease: Secondary | ICD-10-CM | POA: Insufficient documentation

## 2021-01-11 DIAGNOSIS — N1831 Chronic kidney disease, stage 3a: Secondary | ICD-10-CM | POA: Diagnosis not present

## 2021-01-11 DIAGNOSIS — R059 Cough, unspecified: Secondary | ICD-10-CM

## 2021-01-11 DIAGNOSIS — Z713 Dietary counseling and surveillance: Secondary | ICD-10-CM | POA: Diagnosis not present

## 2021-01-11 DIAGNOSIS — I5022 Chronic systolic (congestive) heart failure: Secondary | ICD-10-CM | POA: Diagnosis not present

## 2021-01-11 DIAGNOSIS — Z79899 Other long term (current) drug therapy: Secondary | ICD-10-CM | POA: Insufficient documentation

## 2021-01-11 DIAGNOSIS — R053 Chronic cough: Secondary | ICD-10-CM | POA: Diagnosis not present

## 2021-01-11 DIAGNOSIS — Z953 Presence of xenogenic heart valve: Secondary | ICD-10-CM | POA: Diagnosis not present

## 2021-01-11 DIAGNOSIS — Z8719 Personal history of other diseases of the digestive system: Secondary | ICD-10-CM

## 2021-01-11 DIAGNOSIS — Z952 Presence of prosthetic heart valve: Secondary | ICD-10-CM

## 2021-01-11 LAB — BASIC METABOLIC PANEL
Anion gap: 13 (ref 5–15)
BUN: 16 mg/dL (ref 6–20)
CO2: 28 mmol/L (ref 22–32)
Calcium: 9.3 mg/dL (ref 8.9–10.3)
Chloride: 98 mmol/L (ref 98–111)
Creatinine, Ser: 1.68 mg/dL — ABNORMAL HIGH (ref 0.61–1.24)
GFR, Estimated: 47 mL/min — ABNORMAL LOW (ref >60)
Glucose, Bld: 104 mg/dL — ABNORMAL HIGH (ref 70–99)
Potassium: 4 mmol/L (ref 3.5–5.1)
Sodium: 139 mmol/L (ref 135–145)

## 2021-01-11 LAB — MAGNESIUM: Magnesium: 1.9 mg/dL (ref 1.7–2.4)

## 2021-01-11 NOTE — Research (Addendum)
     AT [facility] Cardiopulmonary Examination & Physical Examination Summary    Cardiopulmonary Exam  Was the CP Exam Performed? [x]  Yes (Complete Below) []  No (Complete Protocol Deviation)      Heart Sounds  S3: [x]  Absent []  Present   S4: [x]  Absent []  Present  Murmur  Murmur: []  Yes [x]  No   If Yes:  []  Systolic []  Diastolic   If yes select grade: []  I/VI []  II/VI []  III/VI []  IV/VI []  V/VI []  VI/VI   Chest Sounds  Rales: []  Yes [x]  No   If yes check one: []  ? 1/3 of lung fields full []  1/2 of lung fields full []  > 1/2 of lung fields full  If yes check one: []  Left []  Right []  Bilateral    Edema   Edema (pedal): []  Absent []  Trace [x]  1+ (slight) []  2+ (moderate) []  3+ (severe)    Hepatomegaly  Hepatomegaly: [x]  None []  < 2 cm []  2-4 cm []  > 4 cm  Peripheral Pulses  Peripheral Pulses: [x]  Normal []  Abnormal   If Abnormal, explain:             Physical Exam   Was the physical Exam Performed?  [x]  Yes        []  No (complete protocol deviation)    . Lungs/Chest?     [x]  Normal        []  Abnormal        []  Not Done  If abnormal, was it clinically significant?     []  Yes   []  No       . Dermatologic (Abdominal Area):   [x]  Normal        []  Abnormal        []  Not Done   If abnormal, was it clinically significant?  []  Yes   []  No       . Periphery:     [x]  Normal          []  Abnormal         []  Not Done   If abnormal, was it clinically significant?  []  Yes    []  No      . Other Body System Examined?  []  Yes        [x]  No          New York Heart Association (NYHA) Heart Failure Classification Indicate the subject's current NYHA Classification at the present time (check only one) Was the NYHA Performed:    ?[x]  Yes  ?  [] No (Complete Protocol Deviation)     []   Class I:  Patients have cardiac disease but without the resulting limitations of physical activity. Ordinary physical activity does  not cause undue fatigue, palpitation, dyspnea or anginal pain.     []   Class II:    Patients have cardiac disease resulting in slight limitation of physical activity. They are comfortable at rest. Ordinary physical activity results in fatigue, palpitation, dyspnea or anginal pain.     [x]   Class III:  Patients have cardiac disease resulting in marked limitation of physical activity. They are comfortable at rest. Less than ordinary physical activity causes fatigue, palpitation, dyspnea or anginal pain.     []   Class IV: Patients have cardiac disease resulting in inability to carry on any physical activity without discomfort. Symptoms of cardiac insufficiency or of the anginal syndrome may be present even at rest. If any physical activity is undertaken, discomfort is increased.

## 2021-01-11 NOTE — Research (Addendum)
  [employer] Protocol [id number] AT [facility]  Patient Global Assessment Visual Analog Scale (EQ-VAS)        Subject Number:  16-006  Date Completed:       01/11/2021  Study Day:  Day 1       . We would like to know how good or bad your health is TODAY. [name] This scale is numbered 0 to 100. . 100 means the best health you can imagine.       0 means the worst health you can imagine. . Please let me know the number you feel indicated how your health is TODAY.    VAS SCORE - YOUR HEALTH TODAY : 32

## 2021-01-11 NOTE — Research (Signed)
                6 Minute Walk Was the 6 minute walk performed:    ?[x]  Yes ?  [] No (Complete Protocol Deviation)  Vitals Before 6 Minute Walk Systolic Blood Pressure (mmHg) Diastolic Blood Pressure (mmHg) Heart Rate (BPM) Respiration Rate (BPM)   131 86 71 14  Vitals After 6 Minute Walk Systolic Blood Pressure (mmHg) Diastolic Blood Pressure (mmHg) Heart Rate (BPM) Respiration Rate (BPM)   154 96 86 20  Measured Before 6 Minute Walk Borg Scale Level of Shortness of breath 0.5 very, very slight  Borg Scale Level of Fatigue 0 none at all   Measured After 6 Minute Walk Borg Scale Level of Shortness of breath Grade 2 light  Borg Scale Level of Fatigue grade 2 light   Distance covered in 6 minutes ??? _76______: meters    Did the subject stop or pause before 6 minutes  ? [] Yes (Complete Below) ?  [x] No   Reason for stopping Reason Response If Yes, Comments   Shortness of breath (HF-related) ? Yes[]   ? No[]     Light headedness (HF-related) ? Yes[]   ? No[]     Fatigue (HF-related) ? Yes[]   ? No[]     Other HF-related symptom ? Yes[]   ? No[]     Non-HF symptom(s) ? Yes[]   ? No[]

## 2021-01-11 NOTE — Progress Notes (Signed)
Advanced Heart Failure Clinic Note   Date:  01/11/2021   PCP:  Default, Provider, MD  Cardiologist:  [name], MD Primary HF: Bensimhon  Chief Complaint: Heart Failure follow-up   History of Present Illness:  [name] is a  60 y/o male with h/o systolic HF due to NICM, AS s/p AVR in 4/16, pulmonary HTN, chronic AF, CKD 3 and gout.  Admitted in 5/20 with respiratory failure due to HF exacerbation and pseudomonas PNA. He was treated with antibiotics and diuretics. Echo with EF 20-25% with apical clot. Also was in/out of AF/NSR. When in NSR had episodedsof heart block  Srtarted on anticoagulation, but he developed a GI bleed due to suspected stress ulcers.   Has seen Dr. [name] in 8/20. Remains in AF. No further pauses. Not candidate for ablation or ICD at that time  Echo 9/20 EF 25% AVR stable. Moderate RV HK  Here for for routine f/u. Says for past 2 months has been having cough full of thick mucus. Seen by UC on 08/03/20 and 10/26/20. COVID negative and given albuterol and amoxicillin. CXR with mild vascular congestion. No benefit. No improvement. No having orthopnea and PND. + SOB with mild exertion. No edema. Has been complaint with meds. No fevers or chills. Weight stable.   Today he returns for HF follow up. Overall feeling good. Weight down 6 lbs from yesterday. Wants to work out in [employer]. Says he is no SOB and his swelling is better. Cough is better. Denies increasing SOB, CP, dizziness, edema, or PND/Orthopnea. Appetite ok. No fever or chills. Weight at home 265 pounds. Taking all medications. Is intermittent fasting but is eating packet of Lance crackers in the AM. Keeping fluids limited to <2 L/day.   Past Medical History:  Diagnosis Date  . Acute gastric ulcer with hemorrhage   . Acute respiratory failure ([place]) 05/11/2019  . Aortic valve disease    a. 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 Atrial  flutter ([place])   . Benign hypertensive heart and renal disease   . Cardiogenic shock ([place])   . Chronic combined systolic and diastolic CHF (congestive heart failure) ([place])   . Chronic kidney disease (CKD), stage III (moderate) (HCC)   . COVID-19   . Diabetes mellitus ([place])   . Essential hypertension 11/19/2014  . Gout   . Hx of colonic polyps 11/11/2019  . Hyperlipidemia   . Insomnia   . Iron deficiency anemia   . Murmur   . Noncompliance   . Obesity   . Pseudomonas pneumonia ([place]) 05/16/2019  . Pulmonary hypertension ([place])    Past Surgical History:  Procedure Laterality Date  . AORTIC VALVE REPLACEMENT N/A 04/05/2015   Procedure: AORTIC VALVE REPLACEMENT (AVR) using a 61mm Edwards Aortic Magna Ease Valve ;  Surgeon: [name], MD;  Location: [place];  Service: Open Heart Surgery;  Laterality: N/A;  . COLONOSCOPY WITH PROPOFOL N/A 11/05/2019   Procedure: COLONOSCOPY WITH PROPOFOL;  Surgeon: [name], MD;  [facility]: [facility];  Service: Endoscopy;  Laterality: N/A;  . ESOPHAGOGASTRODUODENOSCOPY (EGD) WITH PROPOFOL N/A 05/19/2019   Procedure: ESOPHAGOGASTRODUODENOSCOPY (EGD) WITH PROPOFOL;  Surgeon: [name], MD;  Location: [place];  Service: Gastroenterology;  Laterality: N/A;  . HEMOSTASIS CLIP PLACEMENT  05/19/2019   Procedure: HEMOSTASIS CLIP PLACEMENT;  Surgeon: [name], MD;  Location: [facility] ENDOSCOPY;  Service: Gastroenterology;;  . LEFT AND RIGHT HEART CATHETERIZATION  WITH CORONARY ANGIOGRAM N/A 11/27/2014   Procedure: LEFT AND RIGHT HEART CATHETERIZATION WITH CORONARY ANGIOGRAM;  Surgeon: [name], MD;  Location: [facility] CATH LAB;  Service: Cardiovascular;  Laterality: N/A;  . MAZE N/A 04/05/2015   Procedure: MAZE;  Surgeon: [name], MD;  Location: [place];  Service: Open Heart Surgery;  Laterality: N/A;  . POLYPECTOMY  11/05/2019   Procedure: POLYPECTOMY;  Surgeon: [name], MD;  [facility];  Service: Endoscopy;;  .  REPLACEMENT ASCENDING AORTA N/A 04/05/2015   Procedure: REPLACEMENT ASCENDING AORTA with a 45mm Hemashield Platinum Graft;  Surgeon: [name], MD;  Location: [place];  Service: Open Heart Surgery;  Laterality: N/A;  . TEE WITHOUT CARDIOVERSION N/A 04/05/2015   Procedure: TRANSESOPHAGEAL ECHOCARDIOGRAM (TEE);  Surgeon: [name], MD;  Location: [place];  Service: Open Heart Surgery;  Laterality: N/A;    Current Outpatient Medications  Medication Sig Dispense Refill  . doxycycline (VIBRA-TABS) 100 MG tablet Take 1 tablet (100 mg total) by mouth 2 (two) times daily. 20 tablet 0  . furosemide (LASIX) 40 MG tablet Take 1 tablet (40 mg total) by mouth daily. 60 tablet 4  . hydrALAZINE (APRESOLINE) 25 MG tablet Take 1 tablet (25 mg total) by mouth 3 (three) times daily. 270 tablet 3  . isosorbide mononitrate (IMDUR) 60 MG 24 hr tablet Take 1 tablet (60 mg total) by mouth daily. 60 tablet 4  . sacubitril-valsartan (ENTRESTO) 97-103 MG Take 1 tablet by mouth 2 (two) times daily. 180 tablet 3  . spironolactone (ALDACTONE) 25 MG tablet Take 1 tablet (25 mg total) by mouth daily. 90 tablet 3  . warfarin (COUMADIN) 5 MG tablet TAKE 1 AND 1/2 TO 2 TABLETS BY MOUTH DAILY AS DIRECTED BY COUMADIN CLINIC 55 tablet 1   No current facility-administered medications for this encounter.    Allergies:   Patient has no known allergies.   Social History:  The patient  reports that he has never smoked. He has never used smokeless tobacco. He reports current alcohol use. He reports that he does not use drugs.   Family History:  The patient's family history includes Liver disease in his cousin; Other in his mother.   ROS:  Please see the history of present illness.   All other systems are personally reviewed and negative.   Vitals:   01/11/21 1411  BP: 131/86  Pulse: 71  SpO2: 97%   Wt Readings from Last 3 Encounters:  01/11/21 121.6 kg (268 lb)  01/10/21 124.3 kg (274 lb)  08/03/20 122.5 kg (270 lb)    ReDs: 34% Exam:  General:  NAD. No resp difficulty HEENT: Normal Neck: Supple. JVP 7-8. Carotids 2+ bilat; no bruits. No lymphadenopathy or thryomegaly appreciated. Cor: PMI nondisplaced. Irregularly irregular rate & rhythm. No rubs, gallops or murmurs. Lungs: Clear Abdomen: Obese, soft, nontender, nondistended. No hepatosplenomegaly. No bruits or masses. Good bowel sounds. Extremities: No cyanosis, clubbing, rash, 1+ bilateral lower extremity edema Neuro: alert & oriented x 3, cranial nerves grossly intact. Moves all 4 extremities w/o difficulty. Affect pleasant.  Recent Labs: 01/10/2021: B Natriuretic Peptide 802.0; BUN 13; Creatinine, Ser 1.48; Hemoglobin 15.8; Magnesium 2.0; Platelets 257; Potassium 4.0; Sodium 138  Personally reviewed   Wt Readings from Last 3 Encounters:  01/11/21 121.6 kg (268 lb)  01/10/21 124.3 kg (274 lb)  08/03/20 122.5 kg (270 lb)     ASSESSMENT AND PLAN:  1. Productive cough and increasing SOB - Likely mostly HF  and volume overload. - Better today, CXR no vascular congestion, edema, consolidation or pleural effusions. - Continue diurese as below. - Continue doxycycline 100 bid x 7 days.  2. Chronic systolic HF with biventricular failure - Echo 12/19 EF 50% - Echo 05/11/19 EF 20-25% with moderately decreased RV function  - Due to NICM (coronaries normal in 2016). Suspect due to probable tachy-induced CM (Was in AFL in 140s on admit). - Echo 9/20 EF 25% - [facility]rolled with [employer]. Received lasix 40 mg sq yesterday w/ brisk urinary response. - NYHA III, volume status elevated, ReDs 34% but weight down 6 lbs and SOB better. - One more dose of lasix 40 mg SQ today, then lasix 40 mg po daily. -ContinueEntresto 97/103 mg bid. - Continue hydralazine 25 mg tid+ Imdur 30 mg daily. - Continue spiro 25 mg daily. - Consider Farxiga at next visit. - No b-blocker with sinus and AV node dysfunction. - Echo scheduled 2/22. - If EF not improving will need  to reconsider biv ICD. Dr. [name] following. - Spent time discussing low-salt choices and limiting fluids intake. - [place] today.  3. Persitent AFL/ AFIB with tachy-brady syndrome - Remains in AF. Rate controlled 71 bpm. - Has failed previous Maze. Not felt to be candidate for ablation. - Suspect this may be contributing to CM but rates no longer fast  - Previous ECGs had  AV dissociation with accelerated junctional outpacing his sinus - Has been seen by EP. No need for pacer just yet but may need to consider to permit b-blocker. Followed by Dr. [name]. - Continue warfarin. No bleeding.  4. CKD 3a - Baseline creatinine 1.8 - Last SCr 1.48 - Repeat today.  5. Bioprosthetic AVR in 4/16 - Reminded of SBE prophylaxis. - Echo 2/22.  6. Pulmonary HTN - Likely WHO group 2 & 3.  7. LV apical thrombus - On warfarin.   8. GIB - Had duodenal ulcers.  - Colonoscopy 11/05/19 with Dr. Carlean Purl. 2 small polyps.  9. Snoring - Says he has had one years ago. - Needs repeat sleep study.  10. Obesity Body mass index is 37.38 kg/m. - Encouraged portion control, low-carb choices, & salt restriction. - No heavy cardio or weight lifting until repeat Echo. Walking on TM or outside at a steady pace is OK. - Emphasized not to over-exert himself.   Will see back Thursday, may need another dose of SQ lasix. Consider addition of Farxiga at this time. Echo scheduled for next month then follow up with Dr. Haroldine Laws.  Frankey Poot, FNP  01/11/2021 3:14 PM  Advanced Heart Failure [place] [address] Heart and [place] [address] [phone] (office) [phone] (fax)

## 2021-01-11 NOTE — Patient Instructions (Signed)
Your physician recommends that you schedule a follow-up appointment in: 3-4 weeks   If you have any questions or concerns before your next appointment please send [place] a message through [place] or call our office at [phone].    TO LEAVE A MESSAGE FOR THE NURSE SELECT OPTION 2, PLEASE LEAVE A MESSAGE INCLUDING: . YOUR NAME . DATE OF BIRTH . CALL BACK NUMBER . REASON FOR CALL**this is important as we prioritize the call backs  YOU WILL RECEIVE A CALL BACK THE SAME DAY AS LONG AS YOU CALL BEFORE 4:00 PM

## 2021-01-11 NOTE — Research (Signed)
            Composite Congestion Score (CCS) Indicate the subject's current CCS Score at the present time (select for each signs/symptoms) Was CCS Performed:   ?  [x]Yes ?  []No (Complete Protocol Deviation)     Signs/Symptoms 0 - None  1 -Seldom 2 -Frequent 3 -Continuous  Dyspnoea [] [] [x] []  Orthopnoea [] [] [x] []  Fatigue [] [] [x] []   Signs/Symptoms 0 - <=6  1 - 6-9 2 - 10-15 3 - >=15  JVD (cm H2O) [x] [] [] []   Signs/Symptoms 0 -None  1 -Bases 2 -To <50% 3 -To >50%  Rales [x] [] [] []   Signs/Symptoms 0 -Absent/ trace  1 -Slight 2 -Moderate 3 -Marked  Oedema [x] [] [] []   

## 2021-01-12 ENCOUNTER — Telehealth: Payer: Self-pay | Admitting: *Deleted

## 2021-01-12 ENCOUNTER — Telehealth: Payer: Self-pay

## 2021-01-12 DIAGNOSIS — Z006 Encounter for examination for normal comparison and control in clinical research program: Secondary | ICD-10-CM

## 2021-01-12 NOTE — Telephone Encounter (Signed)
Called pt since he is overdue; was able to speak with pt and set an appt for tomorrow. Pt states he's been taking his warfarin and has lost some weight while in a research study.

## 2021-01-12 NOTE — Progress Notes (Signed)
Advanced Heart Failure Clinic Note   Date:  01/13/2021   PCP:  Default, Provider, MD  Cardiologist:  [name], MD Primary HF: Bensimhon  Chief Complaint: Heart Failure   History of Present Illness: [name] is a  60 y/o male with h/o systolic HF due to NICM, AS s/p AVR in 4/16, pulmonary HTN, chronic AF, CKD 3 and gout.  Admitted in 5/20 with respiratory failure due to HF exacerbation and pseudomonas PNA. He was treated with antibiotics and diuretics. Echo with EF 20-25% with apical clot. Also was in/out of AF/NSR. When in NSR had episodedsof heart block  Srtarted on anticoagulation, but he developed a GI bleed due to suspected stress ulcers.   Has seen Dr. [name] in 8/20. Remains in AF. No further pauses. Not candidate for ablation or ICD at that time  Echo 9/20 EF 25% AVR stable. Moderate RV HK  Here for for routine f/u. Says for past 2 months has been having cough full of thick mucus. Seen by UC on 08/03/20 and 10/26/20. COVID negative and given albuterol and amoxicillin. CXR with mild vascular congestion. No benefit. No improvement.   Enrolled in [employer] 01/11/20. Received SQ lasix  80 mg daily for 2 days.    Today he returns for HF follow up.Overall feeling fine. Denies SOB/PND/Orthopnea. Able sleep in the bed. Appetite ok. No fever or chills. Weight at home 270--->257   pounds. Taking all medications.   Past Medical History:  Diagnosis Date  . Acute gastric ulcer with hemorrhage   . Acute respiratory failure ([place]) 05/11/2019  . Aortic valve disease    a. 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 Atrial flutter ([place])   . Benign hypertensive heart and renal disease   . Cardiogenic shock ([place])   . Chronic combined systolic and diastolic CHF (congestive heart failure) ([place])   . Chronic kidney disease (CKD), stage III (moderate) (HCC)   . COVID-19   . Diabetes mellitus ([place])   . Essential hypertension  11/19/2014  . Gout   . Hx of colonic polyps 11/11/2019  . Hyperlipidemia   . Insomnia   . Iron deficiency anemia   . Murmur   . Noncompliance   . Obesity   . Pseudomonas pneumonia ([place]) 05/16/2019  . Pulmonary hypertension ([place])    Past Surgical History:  Procedure Laterality Date  . AORTIC VALVE REPLACEMENT N/A 04/05/2015   Procedure: AORTIC VALVE REPLACEMENT (AVR) using a 79mm Edwards Aortic Magna Ease Valve ;  Surgeon: [name], MD;  Location: [place];  Service: Open Heart Surgery;  Laterality: N/A;  . COLONOSCOPY WITH PROPOFOL N/A 11/05/2019   Procedure: COLONOSCOPY WITH PROPOFOL;  Surgeon: [name], MD;  Location: [facility];  Service: Endoscopy;  Laterality: N/A;  . ESOPHAGOGASTRODUODENOSCOPY (EGD) WITH PROPOFOL N/A 05/19/2019   Procedure: ESOPHAGOGASTRODUODENOSCOPY (EGD) WITH PROPOFOL;  Surgeon: [name], MD;  Location: [facility];  Service: Gastroenterology;  Laterality: N/A;  . HEMOSTASIS CLIP PLACEMENT  05/19/2019   Procedure: HEMOSTASIS CLIP PLACEMENT;  Surgeon: [name], MD;  Location: [facility] ENDOSCOPY;  Service: Gastroenterology;;  . LEFT AND RIGHT HEART CATHETERIZATION WITH CORONARY ANGIOGRAM N/A 11/27/2014   Procedure: LEFT AND RIGHT HEART CATHETERIZATION WITH CORONARY ANGIOGRAM;  Surgeon: [name], MD;  Location: [facility] CATH LAB;  Service: Cardiovascular;  Laterality: N/A;  . MAZE N/A 04/05/2015   Procedure: MAZE;  Surgeon: [name], MD;  Location: [place];  Service: Open Heart Surgery;  Laterality: N/A;  . POLYPECTOMY  11/05/2019   Procedure: POLYPECTOMY;  Surgeon: [name], MD;  [facility]: [facility];  Service: Endoscopy;;  . REPLACEMENT ASCENDING AORTA N/A 04/05/2015   Procedure: REPLACEMENT ASCENDING AORTA with a 78mm Hemashield Platinum Graft;  Surgeon: [name], MD;  Location: [place];  Service: Open Heart Surgery;  Laterality: N/A;  . TEE WITHOUT CARDIOVERSION N/A 04/05/2015   Procedure: TRANSESOPHAGEAL ECHOCARDIOGRAM (TEE);   Surgeon: [name], MD;  Location: [place];  Service: Open Heart Surgery;  Laterality: N/A;    Current Outpatient Medications  Medication Sig Dispense Refill  . furosemide (LASIX) 40 MG tablet Take 1 tablet (40 mg total) by mouth daily. 60 tablet 4  . hydrALAZINE (APRESOLINE) 25 MG tablet Take 1 tablet (25 mg total) by mouth 3 (three) times daily. 270 tablet 3  . isosorbide mononitrate (IMDUR) 60 MG 24 hr tablet Take 1 tablet (60 mg total) by mouth daily. 60 tablet 4  . sacubitril-valsartan (ENTRESTO) 97-103 MG Take 1 tablet by mouth 2 (two) times daily. 180 tablet 3  . spironolactone (ALDACTONE) 25 MG tablet Take 1 tablet (25 mg total) by mouth daily. 90 tablet 3  . warfarin (COUMADIN) 5 MG tablet TAKE 1 AND 1/2 TO 2 TABLETS BY MOUTH DAILY AS DIRECTED BY COUMADIN CLINIC 55 tablet 1   No current facility-administered medications for this encounter.    Allergies:   Patient has no known allergies.   Social History:  The patient  reports that he has never smoked. He has never used smokeless tobacco. He reports current alcohol use. He reports that he does not use drugs.   Family History:  The patient's family history includes Liver disease in his cousin; Other in his mother.   ROS:  Please see the history of present illness.   All other systems are personally reviewed and negative.   Vitals:   01/13/21 1550  BP: 122/70  Pulse: 64  SpO2: 97%   Wt Readings from Last 3 Encounters:  01/13/21 120.3 kg (265 lb 3.2 oz)  01/11/21 121.6 kg (268 lb)  01/10/21 124.3 kg (274 lb)   ReDs: 30%   Exam:  General:  Well appearing. No resp difficulty HEENT: normal Neck: supple. no JVD. Carotids 2+ bilat; no bruits. No lymphadenopathy or thryomegaly appreciated. Cor: PMI nondisplaced. Regular rate & rhythm. No rubs, gallops or murmurs. Lungs: clear Abdomen: soft, nontender, nondistended. No hepatosplenomegaly. No bruits or masses. Good bowel sounds. Extremities: no cyanosis, clubbing, rash,  edema Neuro: alert & orientedx3, cranial nerves grossly intact. moves all 4 extremities w/o difficulty. Affect pleasant  Recent Labs: 01/10/2021: B Natriuretic Peptide 802.0; Hemoglobin 15.8; Platelets 257 01/11/2021: BUN 16; Creatinine, Ser 1.68; Magnesium 1.9; Potassium 4.0; Sodium 139  Personally reviewed   Wt Readings from Last 3 Encounters:  01/13/21 120.3 kg (265 lb 3.2 oz)  01/11/21 121.6 kg (268 lb)  01/10/21 124.3 kg (274 lb)     ASSESSMENT AND PLAN:  1. Productive cough and increasing SOB - Likely mostly HF and volume overload. - Completing at doxycyline course.  - Resolved.   2. Chronic systolic HF with biventricular failure - Echo 12/19 EF 50% - Echo 05/11/19 EF 20-25% with moderately decreased RV function  - Due to NICM (coronaries normal in 2016). Suspect due to probable tachy-induced CM (Was in AFL in 140s on admit). - Echo 9/20 EF 25% - En[place] with [employer]. He has received SQ lasix  - NYHA  II. Volume status much improved. Reds Clip 30%.  - Cut back lasix to 40 mg daily with the addition of farxiga.    -ContinueEntresto 97/103 mg bid. - Continue hydralazine 25 mg tid+ Imdur 30 mg daily. - Continue spiro 25 mg daily. - Start Farxiga 10 mg daily.  Check BMET in am.  - No b-blocker with sinus and AV node dysfunction. - Echo scheduled 2/22. - If EF not improving will need to reconsider biv ICD. Dr. [name] following.  3. Persitent AFL/ AFIB with tachy-brady syndrome - Remains in AF.  - Has failed previous Maze. Not felt to be candidate for ablation. - Previous ECGs had  AV dissociation with accelerated junctional outpacing his sinus - Has been seen by EP. No need for pacer just yet but may need to consider to permit b-blocker. Followed by Dr. [name]. - Continue warfarin. No bleeding.  4. CKD 3a - Baseline creatinine 1.8 -Check BMET toaday and next week.  -  5. Bioprosthetic AVR in 4/16 - Reminded of SBE prophylaxis. - Echo 2/22.  6. Pulmonary  HTN - Likely WHO group 2 & 3.  7. LV apical thrombus - On warfarin.  - INR 2.5. [facility].   8. GIB - Had duodenal ulcers.  - Colonoscopy 11/05/19 with Dr. Carlean Purl. 2 small polyps.  9. Snoring - Says he has had one years ago. - Set up for home sleep study.   10. Obesity    Signed, Darrick Grinder, NP  01/13/2021 4:09 PM  Advanced Heart Failure [place] [address] Heart and [place] [address] [phone] (office) [phone] (fax)

## 2021-01-12 NOTE — Telephone Encounter (Addendum)
At Home-HF  Telephone Script Day: 2    Date of Phone Call: 26/JAN/2022                     Time: 1145                            Subject ID: [id number]        Phone Call Performed:  [x]  Yes  []  No  If No,   []  Subject did not respond   []  Subject unavailable   []  Other, specify ___________     Weight and Blood pressure:  1. Did you use the provided scale to weigh yourself this morning? [x]  Yes  []  No   If Yes, Did you document it in the diary? [x]  Yes []  No  What was your weight today? 258 lbs.  If No, please ask to the subject to weigh himself/herself now and obtain the measurement. _____ lbs.     2. Did you use the provided "cuff" to take your Blood Pressure and Heart Rate this morning? [x]  Yes  []  No  If yes, did you document it in the Diary?  [x]  Yes  [x]  No    What was your Blood Pressure and Heart Rate today?   Heart Rate: 58          Blood Pressure: 126/60   Patient Global Assessment Visual Analog Scale (VAS): Please ask if subject completed the Visual Analog Scale in the Diary.  [x]  Yes []  No If No, Ask to complete now and remind the subject to complete it before next scheduled call or visit.        5-Point Likert Scale: Current Dyspnea Status How much difficulty are you having in breathing now?  []  not short of breath               []  mildly short of breath             []  moderately short of breath             []  severely short of breath                  []  very severely short of breath     7-Point Likert Scale: Patient-Assessed Dyspnea Status Compared to how much difficulty you were having with your breathing just before starting in the study, how is your breathing now?  []  Markedly better       [x]  Moderately better       []  Minimally better       []  No change       []  Minimally worse       []  Moderately worse       []  Markedly worse       Medications/Changes and Medical Events:  1. How much oral diuretic did you take yesterday?  None   2. Did you document in the Diary start time and end time for Furoscix Infusor (if applicable)? [x]  Yes []  No []  N/A   3. Have you had any problems/issues with Infusor? []  Yes [x]  No []  N/A If yes, please describe: ______________________________________________   4. Since your last visit/call, have you started any new medications or have your medications changed? (review compare to last visit/call)  []  Yes [x]  No If yes, please specify:________________________________________________   5. Since your last visit/call, have you had any medical events, have you visited a hospital, Emergency  Department or seen any other health care provider?  []  Yes [x]  No If yes, please describe (including treatments):_____________________________   [employer] Protocol [id number]-01-008 AT [facility] Pilot  Patient Global Assessment Visual Analog Scale (EQ-VAS)        Subject Number:   [id number]  Date Completed:     26/JAN/2022  Study Day:  Day 2       . We would like to know how good or bad your health is TODAY. [name] This scale is numbered 0 to 100. . 100 means the best health you can imagine.       0 means the worst health you can imagine. . Please let me know the number you feel indicated how your health is TODAY.    VAS SCORE - YOUR HEALTH TODAY : 95

## 2021-01-13 ENCOUNTER — Other Ambulatory Visit: Payer: Self-pay

## 2021-01-13 ENCOUNTER — Encounter (HOSPITAL_COMMUNITY): Payer: Self-pay

## 2021-01-13 ENCOUNTER — Telehealth (HOSPITAL_COMMUNITY): Payer: Self-pay | Admitting: Cardiology

## 2021-01-13 ENCOUNTER — Ambulatory Visit (HOSPITAL_COMMUNITY)
Admission: RE | Admit: 2021-01-13 | Discharge: 2021-01-13 | Disposition: A | Discharge status: Home / Self Care | Payer: Medicare Other | Source: Ambulatory Visit | Attending: Adult Health | Admitting: Adult Health

## 2021-01-13 ENCOUNTER — Ambulatory Visit (INDEPENDENT_AMBULATORY_CARE_PROVIDER_SITE_OTHER): Payer: Medicare Other | Admitting: *Deleted

## 2021-01-13 VITALS — BP 122/70 | HR 64 | Wt 265.2 lb

## 2021-01-13 DIAGNOSIS — I5022 Chronic systolic (congestive) heart failure: Secondary | ICD-10-CM | POA: Diagnosis not present

## 2021-01-13 DIAGNOSIS — R0683 Snoring: Secondary | ICD-10-CM | POA: Insufficient documentation

## 2021-01-13 DIAGNOSIS — Z006 Encounter for examination for normal comparison and control in clinical research program: Secondary | ICD-10-CM

## 2021-01-13 DIAGNOSIS — Z8711 Personal history of peptic ulcer disease: Secondary | ICD-10-CM | POA: Insufficient documentation

## 2021-01-13 DIAGNOSIS — I513 Intracardiac thrombosis, not elsewhere classified: Secondary | ICD-10-CM | POA: Diagnosis not present

## 2021-01-13 DIAGNOSIS — Z5181 Encounter for therapeutic drug level monitoring: Secondary | ICD-10-CM

## 2021-01-13 DIAGNOSIS — R059 Cough, unspecified: Secondary | ICD-10-CM

## 2021-01-13 DIAGNOSIS — Z7901 Long term (current) use of anticoagulants: Secondary | ICD-10-CM | POA: Diagnosis not present

## 2021-01-13 DIAGNOSIS — I4819 Other persistent atrial fibrillation: Secondary | ICD-10-CM | POA: Diagnosis not present

## 2021-01-13 DIAGNOSIS — I5042 Chronic combined systolic (congestive) and diastolic (congestive) heart failure: Secondary | ICD-10-CM | POA: Diagnosis present

## 2021-01-13 DIAGNOSIS — Z79899 Other long term (current) drug therapy: Secondary | ICD-10-CM | POA: Insufficient documentation

## 2021-01-13 DIAGNOSIS — I272 Pulmonary hypertension, unspecified: Secondary | ICD-10-CM | POA: Insufficient documentation

## 2021-01-13 DIAGNOSIS — I428 Other cardiomyopathies: Secondary | ICD-10-CM | POA: Insufficient documentation

## 2021-01-13 DIAGNOSIS — I130 Hypertensive heart and chronic kidney disease with heart failure and stage 1 through stage 4 chronic kidney disease, or unspecified chronic kidney disease: Secondary | ICD-10-CM | POA: Diagnosis not present

## 2021-01-13 DIAGNOSIS — N1831 Chronic kidney disease, stage 3a: Secondary | ICD-10-CM | POA: Diagnosis not present

## 2021-01-13 DIAGNOSIS — I495 Sick sinus syndrome: Secondary | ICD-10-CM | POA: Diagnosis not present

## 2021-01-13 DIAGNOSIS — E669 Obesity, unspecified: Secondary | ICD-10-CM | POA: Insufficient documentation

## 2021-01-13 DIAGNOSIS — I5082 Biventricular heart failure: Secondary | ICD-10-CM | POA: Diagnosis not present

## 2021-01-13 DIAGNOSIS — I4892 Unspecified atrial flutter: Secondary | ICD-10-CM | POA: Diagnosis not present

## 2021-01-13 LAB — BASIC METABOLIC PANEL
Anion gap: 13 (ref 5–15)
BUN: 21 mg/dL — ABNORMAL HIGH (ref 6–20)
CO2: 28 mmol/L (ref 22–32)
Calcium: 8.9 mg/dL (ref 8.9–10.3)
Chloride: 100 mmol/L (ref 98–111)
Creatinine, Ser: 1.84 mg/dL — ABNORMAL HIGH (ref 0.61–1.24)
GFR, Estimated: 42 mL/min — ABNORMAL LOW (ref >60)
Glucose, Bld: 120 mg/dL — ABNORMAL HIGH (ref 70–99)
Potassium: 4.3 mmol/L (ref 3.5–5.1)
Sodium: 141 mmol/L (ref 135–145)

## 2021-01-13 LAB — MAGNESIUM: Magnesium: 1.8 mg/dL (ref 1.7–2.4)

## 2021-01-13 LAB — POCT INR: INR: 2.5 (ref 2.0–3.0)

## 2021-01-13 MED ORDER — FUROSEMIDE 40 MG PO TABS: 40.0000 mg | ORAL_TABLET | Freq: Every day | ORAL | 11 refills | Status: DC

## 2021-01-13 MED ORDER — DAPAGLIFLOZIN PROPANEDIOL 10 MG PO TABS: 10.0000 mg | ORAL_TABLET | Freq: Every day | ORAL | 11 refills | Status: DC

## 2021-01-13 NOTE — Progress Notes (Signed)
Patient Name:[name]         DOB: May 02, 1961      Height:5'11"     Weight: 265LB  Office [place]         Referring Provider:AMY CLEGG, NP/ Glori Bickers, MD   Today's Date:01/13/2021   STOP BANG RISK ASSESSMENT S (snore) Have you been told that you snore?     YES   T (tired) Are you often tired, fatigued, or sleepy during the day?   YES  O (obstruction) Do you stop breathing, choke, or gasp during sleep? NO   P (pressure) Do you have or are you being treated for high blood pressure? YES   B (BMI) Is your body index greater than 35 kg/m? YES   A (age) Are you 25 years old or older? YES   N (neck) Do you have a neck circumference greater than 16 inches?   YES   G (gender) Are you a male? YES   TOTAL STOP/BANG "YES" ANSWERS                                                                        For Office Use Only              Procedure Order Form    YES to 3+ Stop Bang questions OR two clinical symptoms - patient qualifies for WatchPAT (CPT 95800)     Submit: This Form + Patient Face Sheet + Clinical Note via CloudPAT or Fax: [phone]         Clinical Notes: Will consult Sleep Specialist and refer for management of therapy due to patient increased risk of Sleep Apnea. Ordering a sleep study due to the following two clinical symptoms: Excessive daytime sleepiness G47.10 Loud snoring R06.83 / Depression F32.9 / Unrefreshed by sleep G47.8  History of high blood pressure R03.0    I understand that I am proceeding with a home sleep apnea test as ordered by my treating physician. I understand that untreated sleep apnea is a serious cardiovascular risk factor and it is my responsibility to perform the test and seek management for sleep apnea. I will be contacted with the results and be managed for sleep apnea by a local sleep physician. I will be receiving equipment and further instructions from [facility]. I shall promptly ship back the  equipment via the included mailing label. I understand my insurance will be billed for the test and as the patient I am responsible for any insurance related out-of-pocket costs incurred. I have been provided with written instructions and can call for additional video or telephonic instruction, with 24-hour availability of qualified personnel to answer any questions: Patient Help Desk [phone].  Patient Signature ______________________________________________________   Date______________________ Patient Telemedicine Verbal Consent

## 2021-01-13 NOTE — Patient Instructions (Signed)
DECREASE Lasix to 40 mg, one tab daily START Farxiga 10 mg, one tab daily  Labs today We will only contact you if something comes back abnormal or we need to make some changes. Otherwise no news is good news!  Your physician has recommended that you have a sleep study. This test records several body functions during sleep, including: brain activity, eye movement, oxygen and carbon dioxide blood levels, heart rate and rhythm, breathing rate and rhythm, the flow of air through your mouth and nose, snoring, body muscle movements, and chest and belly movement. -once we get this approved with your insurance we will be in contact   Keep followup as scheduled   If you have any questions or concerns before your next appointment please send [place] a message through [place] or call our office at [phone].    TO LEAVE A MESSAGE FOR THE NURSE SELECT OPTION 2, PLEASE LEAVE A MESSAGE INCLUDING: . YOUR NAME . DATE OF BIRTH . CALL BACK NUMBER . REASON FOR CALL**this is important as we prioritize the call backs  YOU WILL RECEIVE A CALL BACK THE SAME DAY AS LONG AS YOU CALL BEFORE 4:00 PM

## 2021-01-13 NOTE — Telephone Encounter (Signed)
appt reminder call made Reports he will be present for appt appt detaisl given to pt

## 2021-01-13 NOTE — Patient Instructions (Signed)
Description   Continue same dosage 1.5 tablets daily except for 2 tablets on Mondays, Wednesday and Fridays. Be consistent with your greens. Get INR rechecked in 6 weeks, per pt request. [phone].

## 2021-01-14 ENCOUNTER — Telehealth: Payer: Self-pay | Admitting: *Deleted

## 2021-01-14 ENCOUNTER — Telehealth (HOSPITAL_COMMUNITY): Payer: Self-pay | Admitting: Pharmacy Technician

## 2021-01-14 DIAGNOSIS — Z006 Encounter for examination for normal comparison and control in clinical research program: Secondary | ICD-10-CM

## 2021-01-14 NOTE — Telephone Encounter (Addendum)
At Home-HF  Telephone Script Day: ___4___  Date of Phone Call: 28JAN2022      Time: 4098                                                   Subject ID: [id number]        Phone Call Performed:  [x]  Yes  []  No  If No,   []  Subject did not respond   []  Subject unavailable   []  Other, specify ___________     Weight and Blood pressure:  1. Did you use the provided scale to weigh yourself this morning? [x]  Yes  []  No   If Yes, Did you document it in the diary? [x]  Yes []  No  What was your weight today? ___257__ lbs.  If No, please ask to the subject to weigh himself/herself now and obtain the measurement. _____ lbs.     2. Did you use the provided "cuff" to take your Blood Pressure and Heart Rate this morning? [x]  Yes  []  No  If yes, did you document it in the Diary?  [x]  Yes  []  No    What was your Blood Pressure and Heart Rate today?   Heart Rate: ____66___           Blood Pressure: ___123/75_____         Patient Global Assessment Visual Analog Scale (VAS): Please ask if subject completed the Visual Analog Scale in the Diary.  [x]  Yes []  No If No, Ask to complete now and remind the subject to complete it before next scheduled call or visit.       5-Point Likert Scale: Current Dyspnea Status How much difficulty are you having in breathing now?  []  not short of breath               [x]  mildly short of breath             []  moderately short of breath             []  severely short of breath                  []  very severely short of breath     7-Point Likert Scale: Patient-Assessed Dyspnea Status Compared to how much difficulty you were having with your breathing just before starting in the study, how is your breathing now?  []  Markedly better       [x]  Moderately better       []  Minimally better       []  No change       []  Minimally worse       []  Moderately worse       []  Markedly worse       Medications/Changes and Medical Events:  1. How much  oral diuretic did you take yesterday? __Lasix 40 mg _______   2. Did you document in the Diary start time and end time for Furoscix Infusor (if applicable)? []  Yes []  No [x]  N/A   3. Have you had any problems/issues with Infusor? []  Yes []  No [x]  N/A If yes, please describe: ______________________________________________   4. Since your last visit/call, have you started any new medications or have your medications changed? (review compare to last visit/call)  []  Yes [x]  No If yes, please specify:________________________________________________   5. Since  your last visit/call, have you had any medical events, have you visited a hospital, Emergency Department or seen any other health care provider?  []  Yes [x]  No If yes, please describe (including treatments):_____________________________    [employer] Protocol [id number] AT [facility] Pilot  Patient Global Assessment Visual Analog Scale (EQ-VAS)   . We would like to know how good or bad your health is TODAY. [name] This scale is numbered 0 to 100. . 100 means the best health you can imagine.       0 means the worst health you can imagine. . Please let me know the number you feel indicated how your health is TODAY.    VAS SCORE - YOUR HEALTH TODAY : ___80________

## 2021-01-14 NOTE — Research (Signed)
     AT HOME-HF Cardiopulmonary Examination & Physical Examination Summary    Cardiopulmonary Exam  Was the CP Exam Performed? [x] Yes (Complete Below) [] No (Complete Protocol Deviation)      Heart Sounds  S3: [x] Absent [] Present   S4: [x] Absent [] Present  Murmur  Murmur: [] Yes [x] No   If Yes:  [] Systolic [] Diastolic   If yes select grade: [] I/VI [] II/VI [] III/VI [] IV/VI [] V/VI [] VI/VI   Chest Sounds  Rales: [] Yes [x] No   If yes check one: [] ? 1/3 of lung fields full [] 1/2 of lung fields full [] > 1/2 of lung fields full  If yes check one: [] Left [] Right [] Bilateral    Edema   Edema (pedal): [x] Absent [] Trace [] 1+ (slight) [] 2+ (moderate) [] 3+ (severe)    Hepatomegaly  Hepatomegaly: [x] None [] < 2 cm [] 2-4 cm [] > 4 cm  Peripheral Pulses  Peripheral Pulses: [x] Normal [] Abnormal   If Abnormal, explain:             Physical Exam   Was the physical Exam Performed?  [x] Yes        [] No (complete protocol deviation)    . Lungs/Chest?     [x] Normal        [] Abnormal        [] Not Done  If abnormal, was it clinically significant?     [] Yes   [] No       . Dermatologic (Abdominal Area):   [x] Normal        [] Abnormal        [] Not Done   If abnormal, was it clinically significant?  [] Yes   [] No       . Periphery:     [x] Normal          [] Abnormal         [] Not Done   If abnormal, was it clinically significant?  [] Yes    [] No      . Other Body System Examined?  [] Yes        [x] No          New York Heart Association (NYHA) Heart Failure Classification Indicate the subject's current NYHA Classification at the present time (check only one) Was the NYHA Performed:    ?[x] Yes  ?  []No (Complete Protocol Deviation)     []  Class I:  Patients have cardiac disease but without the resulting limitations of physical activity. Ordinary physical activity does  not cause undue fatigue, palpitation, dyspnea or anginal pain.     [x]  Class II:    Patients have cardiac disease resulting in slight limitation of physical activity. They are comfortable at rest. Ordinary physical activity results in fatigue, palpitation, dyspnea or anginal pain.     []  Class III:  Patients have cardiac disease resulting in marked limitation of physical activity. They are comfortable at rest. Less than ordinary physical activity causes fatigue, palpitation, dyspnea or anginal pain.     []  Class IV: Patients have cardiac disease resulting in inability to carry on any physical activity without discomfort. Symptoms of cardiac insufficiency or of the anginal syndrome may be present even at rest. If any physical activity is undertaken, discomfort is increased.    

## 2021-01-14 NOTE — Progress Notes (Addendum)
Advanced Heart Failure Clinic Note   Date:  01/17/2021   PCP:  Default, Provider, MD  Cardiologist:  [name], MD Primary HF: Dr. Haroldine Laws  Chief Complaint: Heart Failure   History of Present Illness: [name] is a  60 y/o male with h/o systolic HF due to NICM, AS s/p AVR in 4/16, pulmonary HTN, chronic AF, CKD 3 and gout.  Admitted in 5/20 with respiratory failure due to HF exacerbation and pseudomonas PNA. He was treated with antibiotics and diuretics. Echo with EF 20-25% with apical clot. Also was in/out of AF/NSR. When in NSR had episodedsof heart block  Srtarted on anticoagulation, but he developed a GI bleed due to suspected stress ulcers.   Has seen Dr. [name] in 8/20. Remains in AF. No further pauses. Not candidate for ablation or ICD at that time  Echo 9/20 EF 25% AVR stable. Moderate RV HK  Here for for routine f/u. Says for past 2 months has been having cough full of thick mucus. Seen by UC on 08/03/20 and 10/26/20. COVID negative and given albuterol and amoxicillin. CXR with mild vascular congestion. No benefit. No improvement.   Enrolled in AT HOME 01/11/20. Received SQ lasix  80 mg daily for 2 days.    He returns for HF follow up on 01/13/21. Weight at home 270--->257 pounds. Wilder Glade started and lasix decreased to 40 mg daily. Reds 30%.  Today he returns for HF follow up. Overall feeling fine. Denies increasing SOB, CP, dizziness, edema, or PND/Orthopnea. Appetite ok. No fever or chills. Weight at home 265 pounds. Taking all medications. Wants to start working out weights and cardio. Says he is on a strict diet and wants to get down to 220 lbs.    Past Medical History:  Diagnosis Date  . Acute gastric ulcer with hemorrhage   . Acute respiratory failure ([place]) 05/11/2019  . Aortic valve disease    a. 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 Atrial flutter ([place])   . Benign hypertensive heart and  renal disease   . Cardiogenic shock ([place])   . Chronic combined systolic and diastolic CHF (congestive heart failure) ([place])   . Chronic kidney disease (CKD), stage III (moderate) (HCC)   . COVID-19   . Diabetes mellitus ([place])   . Essential hypertension 11/19/2014  . Gout   . Hx of colonic polyps 11/11/2019  . Hyperlipidemia   . Insomnia   . Iron deficiency anemia   . Murmur   . Noncompliance   . Obesity   . Pseudomonas pneumonia ([place]) 05/16/2019  . Pulmonary hypertension ([place])    Past Surgical History:  Procedure Laterality Date  . AORTIC VALVE REPLACEMENT N/A 04/05/2015   Procedure: AORTIC VALVE REPLACEMENT (AVR) using a 11mm Edwards Aortic Magna Ease Valve ;  Surgeon: [name], MD;  Location: [place];  Service: Open Heart Surgery;  Laterality: N/A;  . COLONOSCOPY WITH PROPOFOL N/A 11/05/2019   Procedure: COLONOSCOPY WITH PROPOFOL;  Surgeon: [name], MD;  Location: [facility] ENDOSCOPY;  Service: Endoscopy;  Laterality: N/A;  . ESOPHAGOGASTRODUODENOSCOPY (EGD) WITH PROPOFOL N/A 05/19/2019   Procedure: ESOPHAGOGASTRODUODENOSCOPY (EGD) WITH PROPOFOL;  Surgeon: [name], MD;  Location: [place];  Service: Gastroenterology;  Laterality: N/A;  . HEMOSTASIS CLIP PLACEMENT  05/19/2019   Procedure: HEMOSTASIS CLIP PLACEMENT;  Surgeon: [name], MD;  [facility] ENDOSCOPY;  Service: Gastroenterology;;  . LEFT AND RIGHT HEART CATHETERIZATION WITH CORONARY  ANGIOGRAM N/A 11/27/2014   Procedure: LEFT AND RIGHT HEART CATHETERIZATION WITH CORONARY ANGIOGRAM;  Surgeon: [name], MD;  Location: [facility] CATH LAB;  Service: Cardiovascular;  Laterality: N/A;  . MAZE N/A 04/05/2015   Procedure: MAZE;  Surgeon: [name], MD;  Location: [place];  Service: Open Heart Surgery;  Laterality: N/A;  . POLYPECTOMY  11/05/2019   Procedure: POLYPECTOMY;  Surgeon: [name], MD;  Location: [facility] ENDOSCOPY;  Service: Endoscopy;;  . REPLACEMENT ASCENDING AORTA N/A 04/05/2015   Procedure:  REPLACEMENT ASCENDING AORTA with a 34mm Hemashield Platinum Graft;  Surgeon: [name], MD;  Location: [place];  Service: Open Heart Surgery;  Laterality: N/A;  . TEE WITHOUT CARDIOVERSION N/A 04/05/2015   Procedure: TRANSESOPHAGEAL ECHOCARDIOGRAM (TEE);  Surgeon: [name], MD;  Location: [place];  Service: Open Heart Surgery;  Laterality: N/A;    Current Outpatient Medications  Medication Sig Dispense Refill  . dapagliflozin propanediol (FARXIGA) 10 MG TABS tablet Take 1 tablet (10 mg total) by mouth daily before breakfast. 30 tablet 11  . furosemide (LASIX) 40 MG tablet Take 1 tablet (40 mg total) by mouth daily. 30 tablet 11  . hydrALAZINE (APRESOLINE) 25 MG tablet Take 1 tablet (25 mg total) by mouth 3 (three) times daily. 270 tablet 3  . isosorbide mononitrate (IMDUR) 60 MG 24 hr tablet Take 1 tablet (60 mg total) by mouth daily. 60 tablet 4  . sacubitril-valsartan (ENTRESTO) 97-103 MG Take 1 tablet by mouth 2 (two) times daily. 180 tablet 3  . spironolactone (ALDACTONE) 25 MG tablet Take 1 tablet (25 mg total) by mouth daily. 90 tablet 3  . warfarin (COUMADIN) 5 MG tablet TAKE 1 AND 1/2 TO 2 TABLETS BY MOUTH DAILY AS DIRECTED BY COUMADIN CLINIC 55 tablet 1   No current facility-administered medications for this encounter.    Allergies:   Patient has no known allergies.   Social History:  The patient  reports that he has never smoked. He has never used smokeless tobacco. He reports current alcohol use. He reports that he does not use drugs.   Family History:  The patient's family history includes Liver disease in his cousin; Other in his mother.   ROS:  Please see the history of present illness.   All other systems are personally reviewed and negative.   Vitals:   01/17/21 1445  BP: 112/90  Pulse: 66  SpO2: 96%   Wt Readings from Last 3 Encounters:  01/17/21 120.9 kg (266 lb 9.6 oz)  01/13/21 120.3 kg (265 lb 3.2 oz)  01/11/21 121.6 kg (268 lb)   ReDs: 27%   Exam:   General:  NAD. No resp difficulty HEENT: Normal Neck: Supple. No JVD. Carotids 2+ bilat; no bruits. No lymphadenopathy or thryomegaly appreciated. Cor: PMI nondisplaced. Irregularly irregular rate & rhythm. No rubs, gallops or murmurs. Lungs: Clear Abdomen: Obese, soft, nontender, nondistended. No hepatosplenomegaly. No bruits or masses. Good bowel sounds. Extremities: No cyanosis, clubbing, rash, edema Neuro: alert & oriented x 3, cranial nerves grossly intact. Moves all 4 extremities w/o difficulty. Affect pleasant.  Recent Labs: 01/10/2021: B Natriuretic Peptide 802.0; Hemoglobin 17.3; Platelets 257 01/13/2021: BUN 21; Creatinine, Ser 1.84; Magnesium 1.8; Potassium 4.3; Sodium 141  Personally reviewed   Wt Readings from Last 3 Encounters:  01/17/21 120.9 kg (266 lb 9.6 oz)  01/13/21 120.3 kg (265 lb 3.2 oz)  01/11/21 121.6 kg (268 lb)     ASSESSMENT AND PLAN:  1. Productive cough and  increasing SOB - Likely mostly HF and volume overload. - Completing at doxycyline course.  - Resolved.   2. Chronic systolic HF with biventricular failure - Echo 12/19 EF 50% - Echo 05/11/19 EF 20-25% with moderately decreased RV function  - Due to NICM (coronaries normal in 2016). Suspect due to probable tachy-induced CM (Was in AFL in 140s on admit). - Echo 9/20 EF 25% - [place] [facility]. He has received SQ lasix.  - NYHA II. Volume status euvolemic today. Reds Clip 27%.  - Continue lasix 40 mg daily.    -ContinueEntresto 97/103 mg bid. - Continue hydralazine 25 mg tid+ Imdur 30 mg daily. - Continue spiro 25 mg daily. - Continue Farxiga 10 mg daily.   - No b-blocker with sinus and AV node dysfunction. - Echo scheduled 2/22. - If EF not improving will need to reconsider biv ICD. Dr. [name] following. - BMET & Mag today.  3. Persitent AFL/ AFIB with tachy-brady syndrome - Remains in AF.  - Has failed previous Maze. Not felt to be candidate for ablation. - Previous ECGs had  AV dissociation with accelerated junctional outpacing his sinus - Has been seen by EP. No need for pacer just yet but may need to consider to permit b-blocker. Followed by Dr. [name]. - Continue warfarin. No bleeding.  4. CKD 3a - Baseline creatinine 1.8 - Check BMET today. -  5. Bioprosthetic AVR in 4/16 - Reminded of SBE prophylaxis. - Echo 2/22.  6. Pulmonary HTN - Likely WHO group 2 & 3.  7. LV apical thrombus - On warfarin.  - INR 2.5. [facility].   8. GIB - Had duodenal ulcers.  - Colonoscopy 11/05/19 with Dr. Carlean Purl. 2 small polyps.  9. Snoring - Says he has had one years ago. - Home sleep study pending.   10. Obesity Body mass index is 37.18 kg/m.  - Counseled on low carb, low-salt food choices. - Walking ok, but no heavy cardio/weights until after echo to reassess EF.   Follow up with Dr. Haroldine Laws as scheduled next week.  Frankey Poot, FNP  01/17/2021 4:34 PM  Advanced Heart Failure [place] [address] Heart and [place] [address] [phone] (office) [phone] (fax)

## 2021-01-14 NOTE — Research (Signed)
            Composite Congestion Score (CCS) Indicate the subject's current CCS Score at the present time (select for each signs/symptoms) Was CCS Performed:   ?  [x]Yes ?  []No (Complete Protocol Deviation)     Signs/Symptoms 0 - None  1 -Seldom 2 -Frequent 3 -Continuous  Dyspnoea [] [] [x] []  Orthopnoea [] [] [x] []  Fatigue [] [] [x] []   Signs/Symptoms 0 - <=6  1 - 6-9 2 - 10-15 3 - >=15  JVD (cm H2O) [x] [] [] []   Signs/Symptoms 0 -None  1 -Bases 2 -To <50% 3 -To >50%  Rales [x] [] [] []   Signs/Symptoms 0 -Absent/ trace  1 -Slight 2 -Moderate 3 -Marked  Oedema [x] [] [] []   

## 2021-01-14 NOTE — Telephone Encounter (Signed)
Patient was started on Farxiga in clinic yesterday. Received a notice from his insurance company that they provided him with a temporary fill of [place]. Vania Rea is the preferred medication in this case.  His current 30 day co-pay of Vania Rea is $47.  Asked CMA to update the medication list and send the Jardiance to his pharmacy.  Charlann Boxer, CPhT

## 2021-01-14 NOTE — Research (Addendum)
  [employer] Protocol [id number] AT [facility] Pilot  Patient Global Assessment Visual Analog Scale (EQ-VAS)        Subject Number:   [id number]  Date Completed:        27/JAN/2022  Study Day:  Day 3       . We would like to know how good or bad your health is TODAY. [name] This scale is numbered 0 to 100. . 100 means the best health you can imagine.       0 means the worst health you can imagine. . Please let me know the number you feel indicated how your health is TODAY.    VAS SCORE - YOUR HEALTH TODAY : 80       Avoiding Treatment in the Hospital with Furoscix for the Management of Congestion in Heart Failure - A Pilot Study  AT HOME-HF Pilot     Subject Number:   [id number]  Date Completed:        27/JAN/2022  Study Day:  Day 3   Vital Signs: Respiration Rate:  (bpm) 16 ReDS:  (%) 30

## 2021-01-15 ENCOUNTER — Telehealth: Payer: Self-pay

## 2021-01-15 DIAGNOSIS — Z006 Encounter for examination for normal comparison and control in clinical research program: Secondary | ICD-10-CM

## 2021-01-16 ENCOUNTER — Telehealth: Payer: Self-pay

## 2021-01-16 DIAGNOSIS — Z006 Encounter for examination for normal comparison and control in clinical research program: Secondary | ICD-10-CM

## 2021-01-17 ENCOUNTER — Encounter: Payer: Medicare Other | Admitting: *Deleted

## 2021-01-17 ENCOUNTER — Encounter (HOSPITAL_COMMUNITY): Payer: Self-pay

## 2021-01-17 ENCOUNTER — Other Ambulatory Visit: Payer: Self-pay

## 2021-01-17 ENCOUNTER — Ambulatory Visit (HOSPITAL_COMMUNITY)
Admission: RE | Admit: 2021-01-17 | Discharge: 2021-01-17 | Disposition: A | Discharge status: Home / Self Care | Payer: Medicare Other | Source: Ambulatory Visit | Attending: Family Medicine | Admitting: Family Medicine

## 2021-01-17 VITALS — BP 112/90 | HR 66 | Wt 266.6 lb

## 2021-01-17 DIAGNOSIS — I272 Pulmonary hypertension, unspecified: Secondary | ICD-10-CM | POA: Diagnosis not present

## 2021-01-17 DIAGNOSIS — Z953 Presence of xenogenic heart valve: Secondary | ICD-10-CM | POA: Diagnosis not present

## 2021-01-17 DIAGNOSIS — I428 Other cardiomyopathies: Secondary | ICD-10-CM | POA: Diagnosis not present

## 2021-01-17 DIAGNOSIS — I10 Essential (primary) hypertension: Secondary | ICD-10-CM

## 2021-01-17 DIAGNOSIS — I5022 Chronic systolic (congestive) heart failure: Secondary | ICD-10-CM | POA: Diagnosis present

## 2021-01-17 DIAGNOSIS — I130 Hypertensive heart and chronic kidney disease with heart failure and stage 1 through stage 4 chronic kidney disease, or unspecified chronic kidney disease: Secondary | ICD-10-CM | POA: Insufficient documentation

## 2021-01-17 DIAGNOSIS — E669 Obesity, unspecified: Secondary | ICD-10-CM | POA: Insufficient documentation

## 2021-01-17 DIAGNOSIS — E785 Hyperlipidemia, unspecified: Secondary | ICD-10-CM | POA: Diagnosis not present

## 2021-01-17 DIAGNOSIS — Z8719 Personal history of other diseases of the digestive system: Secondary | ICD-10-CM

## 2021-01-17 DIAGNOSIS — N1831 Chronic kidney disease, stage 3a: Secondary | ICD-10-CM | POA: Diagnosis not present

## 2021-01-17 DIAGNOSIS — E1122 Type 2 diabetes mellitus with diabetic chronic kidney disease: Secondary | ICD-10-CM | POA: Insufficient documentation

## 2021-01-17 DIAGNOSIS — I5042 Chronic combined systolic (congestive) and diastolic (congestive) heart failure: Secondary | ICD-10-CM | POA: Diagnosis not present

## 2021-01-17 DIAGNOSIS — Z006 Encounter for examination for normal comparison and control in clinical research program: Secondary | ICD-10-CM

## 2021-01-17 DIAGNOSIS — R059 Cough, unspecified: Secondary | ICD-10-CM

## 2021-01-17 DIAGNOSIS — Z7984 Long term (current) use of oral hypoglycemic drugs: Secondary | ICD-10-CM | POA: Insufficient documentation

## 2021-01-17 DIAGNOSIS — Z7901 Long term (current) use of anticoagulants: Secondary | ICD-10-CM | POA: Diagnosis not present

## 2021-01-17 DIAGNOSIS — I4819 Other persistent atrial fibrillation: Secondary | ICD-10-CM

## 2021-01-17 DIAGNOSIS — Z6837 Body mass index (BMI) 37.0-37.9, adult: Secondary | ICD-10-CM | POA: Diagnosis not present

## 2021-01-17 DIAGNOSIS — I240 Acute coronary thrombosis not resulting in myocardial infarction: Secondary | ICD-10-CM | POA: Insufficient documentation

## 2021-01-17 DIAGNOSIS — N182 Chronic kidney disease, stage 2 (mild): Secondary | ICD-10-CM

## 2021-01-17 DIAGNOSIS — I4891 Unspecified atrial fibrillation: Secondary | ICD-10-CM | POA: Diagnosis not present

## 2021-01-17 DIAGNOSIS — Z952 Presence of prosthetic heart valve: Secondary | ICD-10-CM

## 2021-01-17 DIAGNOSIS — R0683 Snoring: Secondary | ICD-10-CM | POA: Diagnosis not present

## 2021-01-17 DIAGNOSIS — K269 Duodenal ulcer, unspecified as acute or chronic, without hemorrhage or perforation: Secondary | ICD-10-CM | POA: Insufficient documentation

## 2021-01-17 LAB — BASIC METABOLIC PANEL
Anion gap: 9 (ref 5–15)
BUN: 22 mg/dL — ABNORMAL HIGH (ref 6–20)
CO2: 25 mmol/L (ref 22–32)
Calcium: 9.6 mg/dL (ref 8.9–10.3)
Chloride: 104 mmol/L (ref 98–111)
Creatinine, Ser: 1.74 mg/dL — ABNORMAL HIGH (ref 0.61–1.24)
GFR, Estimated: 45 mL/min — ABNORMAL LOW (ref >60)
Glucose, Bld: 110 mg/dL — ABNORMAL HIGH (ref 70–99)
Potassium: 4.6 mmol/L (ref 3.5–5.1)
Sodium: 138 mmol/L (ref 135–145)

## 2021-01-17 LAB — POCT I-STAT, CHEM 8
BUN: 15 mg/dL (ref 6–20)
Calcium, Ion: 1.22 mmol/L (ref 1.15–1.40)
Chloride: 100 mmol/L (ref 98–111)
Creatinine, Ser: 1.4 mg/dL — ABNORMAL HIGH (ref 0.61–1.24)
Glucose, Bld: 110 mg/dL — ABNORMAL HIGH (ref 70–99)
HCT: 51 % (ref 39.0–52.0)
Hemoglobin: 17.3 g/dL — ABNORMAL HIGH (ref 13.0–17.0)
Potassium: 4.4 mmol/L (ref 3.5–5.1)
Sodium: 141 mmol/L (ref 135–145)
TCO2: 26 mmol/L (ref 22–32)

## 2021-01-17 LAB — MAGNESIUM: Magnesium: 2.2 mg/dL (ref 1.7–2.4)

## 2021-01-17 NOTE — Patient Instructions (Signed)
Labs done today, your results will be available in MyChart, we will contact you for abnormal readings.  We will contact you about the sleep study.  If you have any questions or concerns before your next appointment please send [place] a message through [place] or call our office at [phone].    TO LEAVE A MESSAGE FOR THE NURSE SELECT OPTION 2, PLEASE LEAVE A MESSAGE INCLUDING: . YOUR NAME . DATE OF BIRTH . CALL BACK NUMBER . REASON FOR CALL**this is important as we prioritize the call backs  YOU WILL RECEIVE A CALL BACK THE SAME DAY AS LONG AS YOU CALL BEFORE 4:00 PM

## 2021-01-17 NOTE — Telephone Encounter (Signed)
  [employer] Protocol [id number] AT [facility] Pilot  Patient Global Assessment Visual Analog Scale (EQ-VAS)        Subject Number:   [id number]  Date Completed:        01/16/2021  Study Day:  Day 6       . We would like to know how good or bad your health is TODAY. [name] This scale is numbered 0 to 100. . 100 means the best health you can imagine.       0 means the worst health you can imagine. . Please let me know the number you feel indicated how your health is TODAY.    VAS SCORE - YOUR HEALTH TODAY : 64

## 2021-01-17 NOTE — Telephone Encounter (Addendum)
At Home-HF  Telephone Script Day: 6    Date of Phone Call: 01/16/2021                     Time:1246                                        Subject ID:[id number]        Phone Call Performed:  [x]  Yes  []  No  If No,   []  Subject did not respond   []  Subject unavailable   []  Other, specify ___________     Weight and Blood pressure:  1. Did you use the provided scale to weigh yourself this morning? [x]  Yes  []  No   If Yes, Did you document it in the diary? [x]  Yes []  No  What was your weight today? 262  lbs.  If No, please ask to the subject to weigh himself/herself now and obtain the measurement. _____ lbs.     2. Did you use the provided "cuff" to take your Blood Pressure and Heart Rate this morning? [x]  Yes  []  No  If yes, did you document it in the Diary?  [x]  Yes  []  No    What was your Blood Pressure and Heart Rate today?   Heart Rate:65        Blood Pressure: 141/85         Patient Global Assessment Visual Analog Scale (VAS): Please ask if subject completed the Visual Analog Scale in the Diary.  [x]  Yes []  No If No, Ask to complete now and remind the subject to complete it before next scheduled call or visit.       5-Point Likert Scale: Current Dyspnea Status How much difficulty are you having in breathing now?  [x]  not short of breath               []  mildly short of breath             []  moderately short of breath             []  severely short of breath                  []  very severely short of breath     7-Point Likert Scale: Patient-Assessed Dyspnea Status Compared to how much difficulty you were having with your breathing just before starting in the study, how is your breathing now?  []  Markedly better       [x]  Moderately better       []  Minimally better       []  No change       []  Minimally worse       []  Moderately worse       []  Markedly worse       Medications/Changes and Medical Events:  1. How much oral diuretic did you  take yesterday?40 mg of lasix, 25 mg spironolactone   2. Did you document in the Diary start time and end time for Furoscix Infusor (if applicable)? []  Yes []  No [x]  N/A   3. Have you had any problems/issues with Infusor? []  Yes []  No [x]  N/A If yes, please describe: ______________________________________________   4. Since your last visit/call, have you started any new medications or have your medications changed? (review compare to last visit/call)  []  Yes [x]  No If yes, please specify:________________________________________________  5. Since your last visit/call, have you had any medical events, have you visited a hospital, Emergency Department or seen any other health care provider?  []  Yes [x]  No If yes, please describe (including treatments):_____________________________

## 2021-01-17 NOTE — Telephone Encounter (Signed)
  [employer] Protocol [id number] AT [facility] Pilot  Patient Global Assessment Visual Analog Scale (EQ-VAS)        Subject Number:  [id number]  Date Completed:       01/15/2021  Study Day:   Day 5       . We would like to know how good or bad your health is TODAY. [name] This scale is numbered 0 to 100. . 100 means the best health you can imagine.       0 means the worst health you can imagine. . Please let me know the number you feel indicated how your health is TODAY.    VAS SCORE - YOUR HEALTH TODAY : 30

## 2021-01-17 NOTE — Telephone Encounter (Signed)
At Home-HF  Telephone Script Day: 5   Date: 01/15/2021 Time:1159 Subject ID: [id number]        Phone Call Performed:  [x]  Yes  []  No  If No,   []  Subject did not respond   []  Subject unavailable   []  Other, specify ___________     Weight and Blood pressure:  1. Did you use the provided scale to weigh yourself this morning? [x]  Yes  []  No   If Yes, Did you document it in the diary? [x]  Yes []  No  What was your weight today? 259  lbs.  If No, please ask to the subject to weigh himself/herself now and obtain the measurement. _____ lbs.     2. Did you use the provided "cuff" to take your Blood Pressure and Heart Rate this morning? [x]  Yes  []  No  If yes, did you document it in the Diary?  [x]  Yes  []  No    What was your Blood Pressure and Heart Rate today?   Heart Rate: 68          Blood Pressure: 145/85         Patient Global Assessment Visual Analog Scale (VAS): Please ask if subject completed the Visual Analog Scale in the Diary.  [x]  Yes []  No If No, Ask to complete now and remind the subject to complete it before next scheduled call or visit.       5-Point Likert Scale: Current Dyspnea Status How much difficulty are you having in breathing now?  [x]  not short of breath               []  mildly short of breath             []  moderately short of breath             []  severely short of breath                  []  very severely short of breath     7-Point Likert Scale: Patient-Assessed Dyspnea Status Compared to how much difficulty you were having with your breathing just before starting in the study, how is your breathing now?  []  Markedly better       [x]  Moderately better       []  Minimally better       []  No change       []  Minimally worse       []  Moderately worse       []  Markedly worse       Medications/Changes and Medical Events:  1. How much oral diuretic did you take yesterday? 40 mg of lasix, 25 mg of spironolactone   2. Did you  document in the Diary start time and end time for Furoscix Infusor (if applicable)? [x]  Yes []  No []  N/A   3. Have you had any problems/issues with Infusor? []  Yes []  No [x]  N/A If yes, please describe: ______________________________________________   4. Since your last visit/call, have you started any new medications or have your medications changed? (review compare to last visit/call)  []  Yes [x]  No If yes, please specify:________________________________________________   5. Since your last visit/call, have you had any medical events, have you visited a hospital, Emergency Department or seen any other health care provider?  []  Yes [x]  No If yes, please describe (including treatments):_____________________________

## 2021-01-18 NOTE — Research (Addendum)
          Day 7 Visit January 31st, 2022 Subject 16-006   Composite Congestion Score (CCS) Indicate the subject's current CCS Score at the present time (select for each signs/symptoms) Was CCS Performed:     [x] Yes   [] No (Complete Protocol Deviation)     Signs/Symptoms 0 - None  1 -Seldom 2 -Frequent 3 -Continuous  Dyspnoea [x]  []  []  []   Orthopnoea [x]  []  []  []   Fatigue [x]  []  []  []    Signs/Symptoms 0 - <=6  1 - 6-9 2 - 10-15 3 - >=15  JVD (cm H2O) [x]  []  []  []    Signs/Symptoms 0 -None  1 -Bases 2 -To <50% 3 -To >50%  Rales [x]  []  []  []    Signs/Symptoms 0 -Absent/ trace  1 -Slight 2 -Moderate 3 -Marked  Oedema [x]  []  []  []

## 2021-01-18 NOTE — Research (Signed)
             Day 7 Visit  Subject [id number] January 31st, 2022    6 Minute Walk Was the 6 minute walk performed:    [x]  Yes  [] No (Complete Protocol Deviation)  Vitals Before 6 Minute Walk Systolic Blood Pressure (mmHg) Diastolic Blood Pressure (mmHg) Heart Rate (BPM) Respiration Rate (BPM)   112 90 66 20  Vitals After 6 Minute Walk Systolic Blood Pressure (mmHg) Diastolic Blood Pressure (mmHg) Heart Rate (BPM) Respiration Rate (BPM)   Not completed  Not completed Not completed Not completed  Measured Before 6 Minute Walk Borg Scale Level of Shortness of breath: Grade 0 - Nothing at all  Borg Scale Level of Fatigue: Grade 0 - Nothing at all   Measured After 6 Minute Walk Borg Scale Level of Shortness of breath: Grade 0 - Nothing at all  Borg Scale Level of Fatigue: Grade 0 - Nothing at all   Distance covered in 6 minutes  305 : meters    Did the subject stop or pause before 6 minutes   [x] Yes (Complete Below)   [] No   Reason for stopping Reason Response If Yes, Comments   Shortness of breath (HF-related) Yes[]    No[x]     Light headedness (HF-related)  Yes[]    No[x]     Fatigue (HF-related)  Yes[]    No[x]     Other HF-related symptom Yes[]    No[x]     Non-HF symptom(s) Yes[x]    No[]  Patient said he had to stretch his calf muscles because they felt "tight"

## 2021-01-18 NOTE — Research (Signed)
AT [facility] Cardiopulmonary Examination & Physical Examination Summary Day 7 Visit  January 31st, 2022  Subject 16-006   Cardiopulmonary Exam  Was the CP Exam Performed? [x]  Yes (Complete Below) []  No (Complete Protocol Deviation)      Heart Sounds  S3: [x]  Absent []  Present   S4: [x]  Absent []  Present  Murmur  Murmur: []  Yes [x]  No   If Yes:  []  Systolic []  Diastolic   If yes select grade: []  I/VI []  II/VI []  III/VI []  IV/VI []  V/VI []  VI/VI   Chest Sounds  Rales: []  Yes [x]  No   If yes check one: []  ? 1/3 of lung fields full []  1/2 of lung fields full []  > 1/2 of lung fields full  If yes check one: []  Left []  Right []  Bilateral    Edema   Edema (pedal): [x]  Absent []  Trace []  1+ (slight) []  2+ (moderate) []  3+ (severe)    Hepatomegaly  Hepatomegaly: [x]  None []  < 2 cm []  2-4 cm []  > 4 cm  Peripheral Pulses  Peripheral Pulses: [x]  Normal []  Abnormal   If Abnormal, explain:             Physical Exam   Was the physical Exam Performed?  [x]  Yes        []  No (complete protocol deviation)    . Lungs/Chest?     [x]  Normal        []  Abnormal        []  Not Done  If abnormal, was it clinically significant?     []  Yes   []  No       . Dermatologic (Abdominal Area):   [x]  Normal        []  Abnormal        []  Not Done   If abnormal, was it clinically significant?  []  Yes   []  No       . Periphery:     [x]  Normal          []  Abnormal         []  Not Done   If abnormal, was it clinically significant?  []  Yes    []  No      . Other Body System Examined?  []  Yes        [x]  No          New York Heart Association (NYHA) Heart Failure Classification Indicate the subject's current NYHA Classification at the present time (check only one) Was the NYHA Performed:    [x]  Yes    [] No (Complete Protocol Deviation)     []   Class I:  Patients have cardiac disease but without the resulting limitations of  physical activity. Ordinary physical activity does not cause undue fatigue, palpitation, dyspnea or anginal pain.     []   Class II:    Patients have cardiac disease resulting in slight limitation of physical activity. They are comfortable at rest. Ordinary physical activity results in fatigue, palpitation, dyspnea or anginal pain.     [x]   Class III:  Patients have cardiac disease resulting in marked limitation of physical activity. They are comfortable at rest. Less than ordinary physical activity causes fatigue, palpitation, dyspnea or anginal pain.     []   Class IV: Patients have cardiac disease resulting in inability to carry on any physical activity without discomfort. Symptoms of cardiac insufficiency or of the anginal syndrome may be present even at rest. If any physical activity  is undertaken, discomfort is increased.

## 2021-01-18 NOTE — Research (Signed)
  [employer] Protocol [id number] AT [facility] Pilot  Patient Global Assessment Visual Analog Scale (EQ-VAS)        Subject Number:   [id number]  Date Completed:       January 31st, 2022  Study Day:   Day 7       . We would like to know how good or bad your health is TODAY. [name] This scale is numbered 0 to 100. . 100 means the best health you can imagine.       0 means the worst health you can imagine. . Please let me know the number you feel indicated how your health is TODAY.    VAS SCORE - YOUR HEALTH TODAY : 83

## 2021-01-20 ENCOUNTER — Other Ambulatory Visit: Payer: Self-pay

## 2021-01-20 ENCOUNTER — Telehealth (HOSPITAL_COMMUNITY): Payer: Self-pay | Admitting: Pharmacy Technician

## 2021-01-20 ENCOUNTER — Ambulatory Visit (HOSPITAL_COMMUNITY)
Admission: RE | Admit: 2021-01-20 | Discharge: 2021-01-20 | Disposition: A | Discharge status: Home / Self Care | Payer: Medicare Other | Source: Ambulatory Visit | Attending: Internal Medicine | Admitting: Internal Medicine

## 2021-01-20 DIAGNOSIS — I5022 Chronic systolic (congestive) heart failure: Secondary | ICD-10-CM

## 2021-01-20 DIAGNOSIS — I429 Cardiomyopathy, unspecified: Secondary | ICD-10-CM | POA: Insufficient documentation

## 2021-01-20 DIAGNOSIS — I4891 Unspecified atrial fibrillation: Secondary | ICD-10-CM | POA: Insufficient documentation

## 2021-01-20 DIAGNOSIS — E785 Hyperlipidemia, unspecified: Secondary | ICD-10-CM | POA: Diagnosis not present

## 2021-01-20 DIAGNOSIS — I359 Nonrheumatic aortic valve disorder, unspecified: Secondary | ICD-10-CM | POA: Insufficient documentation

## 2021-01-20 DIAGNOSIS — I130 Hypertensive heart and chronic kidney disease with heart failure and stage 1 through stage 4 chronic kidney disease, or unspecified chronic kidney disease: Secondary | ICD-10-CM | POA: Insufficient documentation

## 2021-01-20 DIAGNOSIS — N189 Chronic kidney disease, unspecified: Secondary | ICD-10-CM | POA: Insufficient documentation

## 2021-01-20 NOTE — Telephone Encounter (Signed)
Patient was seen recently in clinic and started on Farxiga. His insurance prefers [place]. Sent Chantel (CMA), a message Wednesday 2/2 requesting the patient's medication list be updated and a Jardiance prescription to be sent to his pharmacy.  Charlann Boxer, CPhT

## 2021-01-20 NOTE — Progress Notes (Signed)
  Echocardiogram 2D Echocardiogram has been performed.  [name] 01/20/2021, 3:35 PM

## 2021-01-21 ENCOUNTER — Telehealth (HOSPITAL_COMMUNITY): Payer: Self-pay | Admitting: Pharmacist

## 2021-01-21 MED ORDER — EMPAGLIFLOZIN 10 MG PO TABS: 10.0000 mg | ORAL_TABLET | Freq: Every day | ORAL | 3 refills | Status: DC

## 2021-01-21 NOTE — Telephone Encounter (Signed)
Sent Jardiance prescription to [facility]. Changing from [place] due to insurance preference.    Audry Riles, PharmD, BCPS, BCCP, CPP Heart Failure Clinic Pharmacist [phone]

## 2021-01-23 LAB — ECHOCARDIOGRAM COMPLETE
AR max vel: 1.62 cm2
AV Area VTI: 1.65 cm2
AV Area mean vel: 1.59 cm2
AV Mean grad: 8 mmHg
AV Peak grad: 14.4 mmHg
Ao pk vel: 1.9 m/s
Area-P 1/2: 4.49 cm2
P 1/2 time: 550 msec
S' Lateral: 4.4 cm

## 2021-01-26 ENCOUNTER — Encounter (HOSPITAL_COMMUNITY): Payer: Medicare Other | Admitting: Internal Medicine

## 2021-01-26 ENCOUNTER — Ambulatory Visit: Payer: Medicare Other | Admitting: Gastroenterology

## 2021-01-27 ENCOUNTER — Telehealth (HOSPITAL_COMMUNITY): Payer: Self-pay | Admitting: Licensed Clinical Social Worker

## 2021-01-27 ENCOUNTER — Telehealth: Payer: Self-pay

## 2021-01-27 DIAGNOSIS — Z006 Encounter for examination for normal comparison and control in clinical research program: Secondary | ICD-10-CM

## 2021-01-27 NOTE — Telephone Encounter (Signed)
CSW informed by research team that pt reporting inability to pay for Jardiance (cost $41/fill)  CSW provided pt with 14day free card and had pt sign [employer] application to hopefully get manufacturer assistance.   Will continue to follow and assist as needed  Jorge Ny, [facility] Desk#: [phone] Cell#: [phone]

## 2021-01-27 NOTE — Telephone Encounter (Signed)
At Home-HF  Telephone Script Day: 17    Date of Phone Call: 01/27/2021                                 Time: 1610                                                Subject ID: [id number]        Phone Call Performed:  [x]  Yes  []  No  If No,   []  Subject did not respond   []  Subject unavailable   []  Other, specify ___________     Weight and Blood pressure:  1. Did you use the provided scale to weigh yourself this morning? [x]  Yes  []  No   If Yes, Did you document it in the diary? [x]  Yes []  No  What was your weight today? 263 lbs.  If No, please ask to the subject to weigh himself/herself now and obtain the measurement. _____ lbs.     2. Did you use the provided "cuff" to take your Blood Pressure and Heart Rate this morning? [x]  Yes  []  No  If yes, did you document it in the Diary?  [x]  Yes  []  No    What was your Blood Pressure and Heart Rate today?   Heart Rate: 65           Blood Pressure: 139/69         Patient Global Assessment Visual Analog Scale (VAS): Please ask if subject completed the Visual Analog Scale in the Diary.  [x]  Yes []  No If No, Ask to complete now and remind the subject to complete it before next scheduled call or visit.       5-Point Likert Scale: Current Dyspnea Status How much difficulty are you having in breathing now?  [x]  not short of breath               []  mildly short of breath             []  moderately short of breath             []  severely short of breath                  []  very severely short of breath     7-Point Likert Scale: Patient-Assessed Dyspnea Status Compared to how much difficulty you were having with your breathing just before starting in the study, how is your breathing now?  []  Markedly better       [x]  Moderately better       []  Minimally better       []  No change       []  Minimally worse       []  Moderately worse       []  Markedly worse     [employer] Protocol [id number]-01-008 AT [facility]  Pilot  Patient Global Assessment Visual Analog Scale (EQ-VAS)        Subject Number:   [id number]  Date Completed:        01/27/2021  Study Day:  Day 17       . We would like to know how good or bad your health is TODAY. [name] This scale is numbered 0 to 100. . 100 means the  best health you can imagine.       0 means the worst health you can imagine. . Please let me know the number you feel indicated how your health is TODAY.    VAS SCORE - YOUR HEALTH TODAY : 75          Medications/Changes and Medical Events:  1. How much oral diuretic did you take yesterday? 40 mg lasix, 25 mg spironolactone    2. Did you document in the Diary start time and end time for Furoscix Infusor (if applicable)? []  Yes []  No [x]  N/A   3. Have you had any problems/issues with Infusor? []  Yes []  No [x]  N/A If yes, please describe: ______________________________________________   4. Since your last visit/call, have you started any new medications or have your medications changed? (review compare to last visit/call)  [x]  Yes []  No If yes, please specify: started Jardiance, discontinued Farxiga    5. Since your last visit/call, have you had any medical events, have you visited a hospital, Emergency Department or seen any other health care provider?  []  Yes [x]  No If yes, please describe (including treatments):_____________________________

## 2021-01-28 ENCOUNTER — Telehealth (HOSPITAL_COMMUNITY): Payer: Self-pay | Admitting: Pharmacy Technician

## 2021-01-28 NOTE — Telephone Encounter (Signed)
Sent in application to [employer] via fax.  Will follow up.

## 2021-02-01 ENCOUNTER — Other Ambulatory Visit (HOSPITAL_COMMUNITY): Payer: Self-pay | Admitting: Internal Medicine

## 2021-02-09 ENCOUNTER — Encounter (HOSPITAL_COMMUNITY): Payer: Self-pay

## 2021-02-09 ENCOUNTER — Other Ambulatory Visit: Payer: Self-pay

## 2021-02-09 ENCOUNTER — Ambulatory Visit (HOSPITAL_COMMUNITY)
Admission: RE | Admit: 2021-02-09 | Discharge: 2021-02-09 | Disposition: A | Discharge status: Home / Self Care | Payer: Medicare Other | Source: Ambulatory Visit | Attending: Family Medicine | Admitting: Family Medicine

## 2021-02-09 VITALS — BP 110/80 | HR 72 | Wt 268.2 lb

## 2021-02-09 DIAGNOSIS — Z7984 Long term (current) use of oral hypoglycemic drugs: Secondary | ICD-10-CM | POA: Diagnosis not present

## 2021-02-09 DIAGNOSIS — Z79899 Other long term (current) drug therapy: Secondary | ICD-10-CM | POA: Insufficient documentation

## 2021-02-09 DIAGNOSIS — I513 Intracardiac thrombosis, not elsewhere classified: Secondary | ICD-10-CM

## 2021-02-09 DIAGNOSIS — Z952 Presence of prosthetic heart valve: Secondary | ICD-10-CM | POA: Diagnosis not present

## 2021-02-09 DIAGNOSIS — I130 Hypertensive heart and chronic kidney disease with heart failure and stage 1 through stage 4 chronic kidney disease, or unspecified chronic kidney disease: Secondary | ICD-10-CM | POA: Insufficient documentation

## 2021-02-09 DIAGNOSIS — Z006 Encounter for examination for normal comparison and control in clinical research program: Secondary | ICD-10-CM

## 2021-02-09 DIAGNOSIS — R0683 Snoring: Secondary | ICD-10-CM

## 2021-02-09 DIAGNOSIS — E785 Hyperlipidemia, unspecified: Secondary | ICD-10-CM | POA: Diagnosis not present

## 2021-02-09 DIAGNOSIS — I272 Pulmonary hypertension, unspecified: Secondary | ICD-10-CM | POA: Insufficient documentation

## 2021-02-09 DIAGNOSIS — Z6837 Body mass index (BMI) 37.0-37.9, adult: Secondary | ICD-10-CM | POA: Insufficient documentation

## 2021-02-09 DIAGNOSIS — N182 Chronic kidney disease, stage 2 (mild): Secondary | ICD-10-CM | POA: Diagnosis not present

## 2021-02-09 DIAGNOSIS — I495 Sick sinus syndrome: Secondary | ICD-10-CM | POA: Insufficient documentation

## 2021-02-09 DIAGNOSIS — Z7901 Long term (current) use of anticoagulants: Secondary | ICD-10-CM | POA: Insufficient documentation

## 2021-02-09 DIAGNOSIS — Z953 Presence of xenogenic heart valve: Secondary | ICD-10-CM | POA: Insufficient documentation

## 2021-02-09 DIAGNOSIS — N1831 Chronic kidney disease, stage 3a: Secondary | ICD-10-CM | POA: Diagnosis not present

## 2021-02-09 DIAGNOSIS — E669 Obesity, unspecified: Secondary | ICD-10-CM | POA: Insufficient documentation

## 2021-02-09 DIAGNOSIS — Z8616 Personal history of COVID-19: Secondary | ICD-10-CM | POA: Insufficient documentation

## 2021-02-09 DIAGNOSIS — I4891 Unspecified atrial fibrillation: Secondary | ICD-10-CM | POA: Insufficient documentation

## 2021-02-09 DIAGNOSIS — E1122 Type 2 diabetes mellitus with diabetic chronic kidney disease: Secondary | ICD-10-CM | POA: Diagnosis not present

## 2021-02-09 DIAGNOSIS — I5082 Biventricular heart failure: Secondary | ICD-10-CM | POA: Diagnosis not present

## 2021-02-09 DIAGNOSIS — I5022 Chronic systolic (congestive) heart failure: Secondary | ICD-10-CM | POA: Insufficient documentation

## 2021-02-09 DIAGNOSIS — I4819 Other persistent atrial fibrillation: Secondary | ICD-10-CM | POA: Diagnosis not present

## 2021-02-09 DIAGNOSIS — I428 Other cardiomyopathies: Secondary | ICD-10-CM | POA: Diagnosis not present

## 2021-02-09 LAB — BASIC METABOLIC PANEL
Anion gap: 14 (ref 5–15)
BUN: 21 mg/dL — ABNORMAL HIGH (ref 6–20)
CO2: 21 mmol/L — ABNORMAL LOW (ref 22–32)
Calcium: 9.5 mg/dL (ref 8.9–10.3)
Chloride: 102 mmol/L (ref 98–111)
Creatinine, Ser: 1.65 mg/dL — ABNORMAL HIGH (ref 0.61–1.24)
GFR, Estimated: 48 mL/min — ABNORMAL LOW (ref >60)
Glucose, Bld: 121 mg/dL — ABNORMAL HIGH (ref 70–99)
Potassium: 4.4 mmol/L (ref 3.5–5.1)
Sodium: 137 mmol/L (ref 135–145)

## 2021-02-09 LAB — MAGNESIUM: Magnesium: 2.3 mg/dL (ref 1.7–2.4)

## 2021-02-09 NOTE — Research (Signed)
     AT [facility] Cardiopulmonary Examination & Physical Examination Summary    Cardiopulmonary Exam  Was the CP Exam Performed? [x]  Yes (Complete Below) []  No (Complete Protocol Deviation)      Heart Sounds  S3: [x]  Absent []  Present   S4: [x]  Absent []  Present  Murmur  Murmur: []  Yes [x]  No   If Yes:  []  Systolic []  Diastolic   If yes select grade: []  I/VI []  II/VI []  III/VI []  IV/VI []  V/VI []  VI/VI   Chest Sounds  Rales: []  Yes [x]  No   If yes check one: []  ? 1/3 of lung fields full []  1/2 of lung fields full []  > 1/2 of lung fields full  If yes check one: []  Left []  Right []  Bilateral    Edema   Edema (pedal): [x]  Absent []  Trace []  1+ (slight) []  2+ (moderate) []  3+ (severe)    Hepatomegaly  Hepatomegaly: [x]  None []  < 2 cm []  2-4 cm []  > 4 cm  Peripheral Pulses  Peripheral Pulses: [x]  Normal []  Abnormal   If Abnormal, explain:             Physical Exam   Was the physical Exam Performed?  [x]  Yes        []  No (complete protocol deviation)    . Lungs/Chest?     [x]  Normal        []  Abnormal        []  Not Done  If abnormal, was it clinically significant?     []  Yes   []  No       . Dermatologic (Abdominal Area):   [x]  Normal        []  Abnormal        []  Not Done   If abnormal, was it clinically significant?  []  Yes   []  No       . Periphery:     [x]  Normal          []  Abnormal         []  Not Done   If abnormal, was it clinically significant?  []  Yes    []  No      . Other Body System Examined?  []  Yes        [x]  No          New York Heart Association (NYHA) Heart Failure Classification Indicate the subject's current NYHA Classification at the present time (check only one) Was the NYHA Performed:    ?[x]  Yes  ?  [] No (Complete Protocol Deviation)     []   Class I:  Patients have cardiac disease but without the resulting limitations of physical activity. Ordinary physical activity does  not cause undue fatigue, palpitation, dyspnea or anginal pain.     [x]   Class II:    Patients have cardiac disease resulting in slight limitation of physical activity. They are comfortable at rest. Ordinary physical activity results in fatigue, palpitation, dyspnea or anginal pain.     []   Class III:  Patients have cardiac disease resulting in marked limitation of physical activity. They are comfortable at rest. Less than ordinary physical activity causes fatigue, palpitation, dyspnea or anginal pain.     []   Class IV: Patients have cardiac disease resulting in inability to carry on any physical activity without discomfort. Symptoms of cardiac insufficiency or of the anginal syndrome may be present even at rest. If any physical activity is undertaken, discomfort is increased.

## 2021-02-09 NOTE — Research (Signed)
            Composite Congestion Score (CCS) Indicate the subject's current CCS Score at the present time (select for each signs/symptoms) Was CCS Performed:   ?  [x] Yes ?  [] No (Complete Protocol Deviation)     Signs/Symptoms 0 - None  1 -Seldom 2 -Frequent 3 -Continuous  Dyspnoea []  [x]  []  []   Orthopnoea [x]  []  []  []   Fatigue []  [x]  []  []    Signs/Symptoms 0 - <=6  1 - 6-9 2 - 10-15 3 - >=15  JVD (cm H2O) [x]  []  []  []    Signs/Symptoms 0 -None  1 -Bases 2 -To <50% 3 -To >50%  Rales [x]  []  []  []    Signs/Symptoms 0 -Absent/ trace  1 -Slight 2 -Moderate 3 -Marked  Oedema [x]  []  []  []

## 2021-02-09 NOTE — Research (Addendum)
  [employer] Protocol [id number] AT [facility] Pilot  Patient Global Assessment Visual Analog Scale (EQ-VAS)        Subject Number:   [id number]  Date Completed:        02/09/2021  Study Day:   Day 30       . We would like to know how good or bad your health is TODAY. [name] This scale is numbered 0 to 100. . 100 means the best health you can imagine.       0 means the worst health you can imagine. . Please let me know the number you feel indicated how your health is TODAY.    VAS SCORE - YOUR HEALTH TODAY :90     Avoiding Treatment in the Hospital with Furoscix for the Management of Congestion in Heart Failure - A Pilot Study  AT HOME-HF Pilot    Subject Number:   [id number]  Date Completed:        02/09/2021  Study Day:   Day 30   Vital Signs: Respiration Rate:  (bpm) 16 ReDS:  (%) 29

## 2021-02-09 NOTE — Progress Notes (Signed)
Advanced Heart Failure Clinic Note   Date:  02/09/2021   PCP:  Default, Provider, MD  Cardiologist:  [name], MD Primary HF: Dr. Haroldine Laws  Chief Complaint: Heart Failure   History of Present Illness: [name] is a  60 y/o male with h/o systolic HF due to NICM, AS s/p AVR in 4/16, pulmonary HTN, chronic AF, CKD 3 and gout.  Admitted in 5/20 with respiratory failure due to HF exacerbation and pseudomonas PNA. He was treated with antibiotics and diuretics. Echo with EF 20-25% with apical clot. Also was in/out of AF/NSR. When in NSR had episodedsof heart block  Srtarted on anticoagulation, but he developed a GI bleed due to suspected stress ulcers.   Has seen Dr. [name] in 8/20. Remains in AF. No further pauses. Not candidate for ablation or ICD at that time  Echo 9/20 EF 25% AVR stable. Moderate RV HK  Here for for routine f/u. Says for past 2 months has been having cough full of thick mucus. Seen by UC on 08/03/20 and 10/26/20. COVID negative and given albuterol and amoxicillin. CXR with mild vascular congestion. No benefit. No improvement.   Enrolled in AT HOME 01/11/20. Received SQ lasix  80 mg daily for 2 days.    He returned for HF follow up on 01/13/21. Weight at home 270--->257 pounds. Wilder Glade started and lasix decreased to 40 mg daily. Reds 30%.  Echo (2/22): EF 20-25%, LV with global hypokinesis, RV moderately down.  Today he returns for HF follow up. Overall feeling fine. Denies increasing SOB, CP, dizziness, edema, or PND/Orthopnea. Appetite ok. No fever or chills. Weight at home 262 pounds. Taking all medications. Wants to start working out weights and increasing cardio.    Past Medical History:  Diagnosis Date  . Acute gastric ulcer with hemorrhage   . Acute respiratory failure ([place]) 05/11/2019  . Aortic valve disease    a. 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 Atrial flutter ([place])   . Benign  hypertensive heart and renal disease   . Cardiogenic shock ([place])   . Chronic combined systolic and diastolic CHF (congestive heart failure) ([place])   . Chronic kidney disease (CKD), stage III (moderate) (HCC)   . COVID-19   . Diabetes mellitus ([place])   . Essential hypertension 11/19/2014  . Gout   . Hx of colonic polyps 11/11/2019  . Hyperlipidemia   . Insomnia   . Iron deficiency anemia   . Murmur   . Noncompliance   . Obesity   . Pseudomonas pneumonia ([place]) 05/16/2019  . Pulmonary hypertension ([place])    Past Surgical History:  Procedure Laterality Date  . AORTIC VALVE REPLACEMENT N/A 04/05/2015   Procedure: AORTIC VALVE REPLACEMENT (AVR) using a 87mm Edwards Aortic Magna Ease Valve ;  Surgeon: [name], MD;  Location: [place];  Service: Open Heart Surgery;  Laterality: N/A;  . COLONOSCOPY WITH PROPOFOL N/A 11/05/2019   Procedure: COLONOSCOPY WITH PROPOFOL;  Surgeon: [name], MD;  [facility];  Service: Endoscopy;  Laterality: N/A;  . ESOPHAGOGASTRODUODENOSCOPY (EGD) WITH PROPOFOL N/A 05/19/2019   Procedure: ESOPHAGOGASTRODUODENOSCOPY (EGD) WITH PROPOFOL;  Surgeon: [name], MD;  Location: [place];  Service: Gastroenterology;  Laterality: N/A;  . HEMOSTASIS CLIP PLACEMENT  05/19/2019   Procedure: HEMOSTASIS CLIP PLACEMENT;  Surgeon: [name], MD;  Location: [facility] ENDOSCOPY;  Service: Gastroenterology;;  . LEFT AND RIGHT HEART CATHETERIZATION WITH CORONARY ANGIOGRAM N/A  11/27/2014   Procedure: LEFT AND RIGHT HEART CATHETERIZATION WITH CORONARY ANGIOGRAM;  Surgeon: [name], MD;  Location: [facility] CATH LAB;  Service: Cardiovascular;  Laterality: N/A;  . MAZE N/A 04/05/2015   Procedure: MAZE;  Surgeon: [name], MD;  Location: [place];  Service: Open Heart Surgery;  Laterality: N/A;  . POLYPECTOMY  11/05/2019   Procedure: POLYPECTOMY;  Surgeon: [name], MD;  [facility];  Service: Endoscopy;;  . REPLACEMENT ASCENDING AORTA N/A  04/05/2015   Procedure: REPLACEMENT ASCENDING AORTA with a 78mm Hemashield Platinum Graft;  Surgeon: [name], MD;  Location: [place];  Service: Open Heart Surgery;  Laterality: N/A;  . TEE WITHOUT CARDIOVERSION N/A 04/05/2015   Procedure: TRANSESOPHAGEAL ECHOCARDIOGRAM (TEE);  Surgeon: [name], MD;  Location: [place];  Service: Open Heart Surgery;  Laterality: N/A;    Current Outpatient Medications  Medication Sig Dispense Refill  . empagliflozin (JARDIANCE) 10 MG TABS tablet Take 1 tablet (10 mg total) by mouth daily. 90 tablet 3  . furosemide (LASIX) 40 MG tablet Take 1 tablet (40 mg total) by mouth daily. 30 tablet 11  . hydrALAZINE (APRESOLINE) 25 MG tablet Take 1 tablet (25 mg total) by mouth 3 (three) times daily. 270 tablet 3  . isosorbide mononitrate (IMDUR) 60 MG 24 hr tablet Take 1 tablet (60 mg total) by mouth daily. 60 tablet 4  . sacubitril-valsartan (ENTRESTO) 97-103 MG Take 1 tablet by mouth 2 (two) times daily. 180 tablet 3  . spironolactone (ALDACTONE) 25 MG tablet TAKE ONE TABLET BY MOUTH DAILY 90 tablet 3  . warfarin (COUMADIN) 5 MG tablet TAKE 1 AND 1/2 TO 2 TABLETS BY MOUTH DAILY AS DIRECTED BY COUMADIN CLINIC 55 tablet 1   No current facility-administered medications for this encounter.    Allergies:   Patient has no known allergies.   Social History:  The patient  reports that he has never smoked. He has never used smokeless tobacco. He reports current alcohol use. He reports that he does not use drugs.   Family History:  The patient's family history includes Liver disease in his cousin; Other in his mother.   ROS:  Please see the history of present illness.   All other systems are personally reviewed and negative.   Vitals:   02/09/21 1410  BP: 110/80  Pulse: 72  SpO2: 96%   Wt Readings from Last 3 Encounters:  02/09/21 121.7 kg (268 lb 3.2 oz)  01/17/21 120.9 kg (266 lb 9.6 oz)  01/13/21 120.3 kg (265 lb 3.2 oz)   Exam:  General:  NAD. No resp  difficulty HEENT: Normal Neck: Supple. No JVD. Carotids 2+ bilat; no bruits. No lymphadenopathy or thryomegaly appreciated. Cor: PMI nondisplaced. Irregular rate & rhythm. No rubs, gallops or murmurs. Lungs: Clear Abdomen: Obese, soft, nontender, nondistended. No hepatosplenomegaly. No bruits or masses. Good bowel sounds. Extremities: No cyanosis, clubbing, rash, edema Neuro: alert & oriented x 3, cranial nerves grossly intact. Moves all 4 extremities w/o difficulty. Affect pleasant.  Recent Labs: 01/10/2021: B Natriuretic Peptide 802.0; Hemoglobin 17.3; Platelets 257 02/09/2021: BUN 21; Creatinine, Ser 1.65; Magnesium 2.3; Potassium 4.4; Sodium 137  Personally reviewed  ASSESSMENT AND PLAN:  1. Chronic systolic HF with biventricular failure - Echo 12/19 EF 50%. - Echo 05/11/19 EF 20-25% with moderately decreased RV function  - Due to NICM (coronaries normal in 2016). Suspect due to probable tachy-induced CM (Was in AFL in 140s on admit). - Echo 9/20 EF  25% - Echo (2/22) EF 20-25%, moderate RV dysfunction. - NYHA II. Volume status euvolemic today. Reds 29%. - Continue lasix 40 mg daily.    -ContinueEntresto 97/103 mg bid. - Continue hydralazine 25 mg tid + Imdur 60 mg daily. - Continue spiro 25 mg daily. - Continue Jardiance 10 mg daily.   - No b-blocker with sinus and AV node dysfunction. - EF not improving (echo 2/22 EF 20-25%), need to reconsider biv ICD. Refer back to Dr. [name]. - OK to resume work outs, understands he is at increased risk for SCD. - [name] today.  2. Persitent AFL/ AFIB with tachy-brady syndrome - Remains in AF.  - Has failed previous Maze. Not felt to be candidate for ablation. - Previous ECGs had AV dissociation with accelerated junctional outpacing his sinus. - Has been seen by EP. No need for pacer just yet but may need to consider to permit b-blocker. Followed by Dr. [name]. - Continue warfarin. No bleeding.  3. CKD 3a - Baseline creatinine  1.8. - Check BMET today.  4. Bioprosthetic AVR in 4/16 - Reminded of SBE prophylaxis. - Stable by Echo 2/22.  5. Pulmonary HTN - Likely WHO group 2 & 3.  6. LV apical thrombus - On warfarin.  - [facility].   7. Snoring - Home sleep study pending.   8. Obesity Body mass index is 37.41 kg/m.  - Counseled on low carb, low-salt food choices. - Walking ok, but no heavy cardio/weights until after echo to reassess EF.  Follow up with Dr. Haroldine Laws 2-3 months.  Frankey Poot, FNP-BC  02/09/2021 3:54 PM  Advanced Heart Failure [place] [address] Heart and [place] [address] [phone] (office) [phone] (fax)

## 2021-02-09 NOTE — Research (Signed)
                6 Minute Walk Was the 6 minute walk performed:    ?[x]  Yes ?  [] No (Complete Protocol Deviation)  Vitals Before 6 Minute Walk Systolic Blood Pressure (mmHg) Diastolic Blood Pressure (mmHg) Heart Rate (BPM) Respiration Rate (BPM)   111 76 77 18  Vitals After 6 Minute Walk Systolic Blood Pressure (mmHg) Diastolic Blood Pressure (mmHg) Heart Rate (BPM) Respiration Rate (BPM)   123 65 74 22  Measured Before 6 Minute Walk Borg Scale Level of Shortness of breath not at all Borg Scale Level of Fatigue not at all   Measured After 6 Minute Walk Borg Scale Level of Shortness of breath not at all  Borg Scale Level of Fatigue not at all   Distance covered in 6 minutes ??? 366: meters    Did the subject stop or pause before 6 minutes  ? [x] Yes (Complete Below) ?  [] No   Reason for stopping Reason Response If Yes, Comments   Shortness of breath (HF-related) ? Yes[]   ? No[x]     Light headedness (HF-related) ? Yes[]   ? No[x]     Fatigue (HF-related) ? Yes[]   ? No[x]     Other HF-related symptom ? Yes[]   ? No[x]     Non-HF symptom(s) ? Yes[x]   ? No[]  Stopped to stretch calves

## 2021-02-09 NOTE — Patient Instructions (Signed)
Labs today We will only contact you if something comes back abnormal or we need to make some changes. Otherwise no news is good news!  You have been referred to [facility] They will be in contact with appt  Your physician recommends that you schedule a follow-up appointment in: 2-3 months with Dr Haroldine Laws  Do the following things EVERYDAY: 1) Weigh yourself in the morning before breakfast. Write it down and keep it in a log. 2) Take your medicines as prescribed 3) Eat low salt foods--Limit salt (sodium) to 2000 mg per day.  4) Stay as active as you can everyday 5) Limit all fluids for the day to less than 2 liters  At the [facility], you and your health needs are our priority. As part of our continuing mission to provide you with exceptional heart care, we have created designated Provider Care Teams. These Care Teams include your primary Cardiologist (physician) and Advanced Practice Providers (APPs- Physician Assistants and Nurse Practitioners) who all work together to provide you with the care you need, when you need it.   You may see any of the following providers on your designated Care Team at your next follow up: [name] Dr Glori Bickers . Dr Loralie Champagne . Dr Vickki Muff . Darrick Grinder, NP . Lyda Jester, [place] . Audry Riles, PharmD   Please be sure to bring in all your medications bottles to every appointment.   If you have any questions or concerns before your next appointment please send [place] a message through [place] or call our office at [phone].    TO LEAVE A MESSAGE FOR THE NURSE SELECT OPTION 2, PLEASE LEAVE A MESSAGE INCLUDING: . YOUR NAME . DATE OF BIRTH . CALL BACK NUMBER . REASON FOR CALL**this is important as we prioritize the call backs  YOU WILL RECEIVE A CALL BACK THE SAME DAY AS LONG AS YOU CALL BEFORE 4:00 PM

## 2021-02-11 ENCOUNTER — Telehealth (HOSPITAL_COMMUNITY): Payer: Self-pay | Admitting: *Deleted

## 2021-02-11 MED ORDER — AMOXICILLIN 500 MG PO TABS: 2000.0000 mg | ORAL_TABLET | ORAL | 3 refills | Status: DC

## 2021-02-11 NOTE — Telephone Encounter (Signed)
Pt called he is in dentist office and is asking about antibiotics prior  Pt has h/o bioprosthetic AVR so he does require SBE prophylaxis, AMox sent into pharmacy, he will resch dental appt,

## 2021-02-24 ENCOUNTER — Other Ambulatory Visit: Payer: Self-pay

## 2021-02-24 ENCOUNTER — Encounter: Payer: Self-pay | Admitting: Internal Medicine

## 2021-02-24 ENCOUNTER — Ambulatory Visit (INDEPENDENT_AMBULATORY_CARE_PROVIDER_SITE_OTHER): Payer: Medicare Other | Admitting: Internal Medicine

## 2021-02-24 ENCOUNTER — Ambulatory Visit (INDEPENDENT_AMBULATORY_CARE_PROVIDER_SITE_OTHER): Payer: Medicare Other | Admitting: *Deleted

## 2021-02-24 VITALS — BP 128/82 | HR 67 | Ht 71.0 in | Wt 272.0 lb

## 2021-02-24 DIAGNOSIS — I428 Other cardiomyopathies: Secondary | ICD-10-CM

## 2021-02-24 DIAGNOSIS — I4819 Other persistent atrial fibrillation: Secondary | ICD-10-CM

## 2021-02-24 DIAGNOSIS — I4892 Unspecified atrial flutter: Secondary | ICD-10-CM

## 2021-02-24 DIAGNOSIS — D6869 Other thrombophilia: Secondary | ICD-10-CM

## 2021-02-24 DIAGNOSIS — I5022 Chronic systolic (congestive) heart failure: Secondary | ICD-10-CM

## 2021-02-24 DIAGNOSIS — Q231 Congenital insufficiency of aortic valve: Secondary | ICD-10-CM

## 2021-02-24 DIAGNOSIS — Z5181 Encounter for therapeutic drug level monitoring: Secondary | ICD-10-CM

## 2021-02-24 DIAGNOSIS — Q2381 Bicuspid aortic valve: Secondary | ICD-10-CM

## 2021-02-24 DIAGNOSIS — I240 Acute coronary thrombosis not resulting in myocardial infarction: Secondary | ICD-10-CM

## 2021-02-24 LAB — POCT INR: INR: 3.7 — AB (ref 2.0–3.0)

## 2021-02-24 NOTE — Patient Instructions (Signed)
Medication Instructions:  Your physician recommends that you continue on your current medications as directed. Please refer to the Current Medication list given to you today.  Labwork: None ordered.  Testing/Procedures: None ordered.  Follow-Up: Your physician wants you to follow-up in: as needed with Thompson Grayer, MD   Any Other Special Instructions Will Be Listed Below (If Applicable).  If you need a refill on your cardiac medications before your next appointment, please call your pharmacy.

## 2021-02-24 NOTE — Patient Instructions (Signed)
Description   Hold your Warfarin dose tomorrow, then continue same dosage 1.5 tablets daily except for 2 tablets on Mondays, Wednesday and Fridays. Be consistent with your greens. Get INR rechecked in 4 weeks. [phone].

## 2021-02-24 NOTE — Progress Notes (Signed)
PCP: Default, Provider, MD Primary Cardiologist: Dr Haroldine Laws Primary EP: Dr [name]  [name] is a 59 y.o. male who presents today for routine electrophysiology followup.  Since last being seen in our clinic, the patient reports doing very well.  He has had COVID requiring ventilation since I saw him last.  He is currently doing very well.  Working as a Clinical research associate and exercising well.  Today, he denies symptoms of palpitations, chest pain, shortness of breath,  lower extremity edema, dizziness, presyncope, or syncope.  The patient is otherwise without complaint today.   Past Medical History:  Diagnosis Date  . Acute gastric ulcer with hemorrhage   . Acute respiratory failure ([place]) 05/11/2019  . Aortic valve disease    a. 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 Atrial flutter ([place])   . Benign hypertensive heart and renal disease   . Cardiogenic shock ([place])   . Chronic combined systolic and diastolic CHF (congestive heart failure) ([place])   . Chronic kidney disease (CKD), stage III (moderate) (HCC)   . COVID-19   . Diabetes mellitus ([place])   . Essential hypertension 11/19/2014  . Gout   . Hx of colonic polyps 11/11/2019  . Hyperlipidemia   . Insomnia   . Iron deficiency anemia   . Murmur   . Noncompliance   . Obesity   . Pseudomonas pneumonia ([place]) 05/16/2019  . Pulmonary hypertension ([place])    Past Surgical History:  Procedure Laterality Date  . AORTIC VALVE REPLACEMENT N/A 04/05/2015   Procedure: AORTIC VALVE REPLACEMENT (AVR) using a 12mm Edwards Aortic Magna Ease Valve ;  Surgeon: [name], MD;  Location: [place];  Service: Open Heart Surgery;  Laterality: N/A;  . COLONOSCOPY WITH PROPOFOL N/A 11/05/2019   Procedure: COLONOSCOPY WITH PROPOFOL;  Surgeon: [name], MD;  [facility];  Service: Endoscopy;  Laterality: N/A;  . ESOPHAGOGASTRODUODENOSCOPY (EGD) WITH PROPOFOL N/A 05/19/2019    Procedure: ESOPHAGOGASTRODUODENOSCOPY (EGD) WITH PROPOFOL;  Surgeon: [name], MD;  Location: [place];  Service: Gastroenterology;  Laterality: N/A;  . HEMOSTASIS CLIP PLACEMENT  05/19/2019   Procedure: HEMOSTASIS CLIP PLACEMENT;  Surgeon: [name], MD;  Location: [facility] ENDOSCOPY;  Service: Gastroenterology;;  . LEFT AND RIGHT HEART CATHETERIZATION WITH CORONARY ANGIOGRAM N/A 11/27/2014   Procedure: LEFT AND RIGHT HEART CATHETERIZATION WITH CORONARY ANGIOGRAM;  Surgeon: [name], MD;  Location: [facility] CATH LAB;  Service: Cardiovascular;  Laterality: N/A;  . MAZE N/A 04/05/2015   Procedure: MAZE;  Surgeon: [name], MD;  Location: [place];  Service: Open Heart Surgery;  Laterality: N/A;  . POLYPECTOMY  11/05/2019   Procedure: POLYPECTOMY;  Surgeon: [name], MD;  [facility]OS[facility];  Service: Endoscopy;;  . REPLACEMENT ASCENDING AORTA N/A 04/05/2015   Procedure: REPLACEMENT ASCENDING AORTA with a 32mm Hemashield Platinum Graft;  Surgeon: [name], MD;  Location: [place];  Service: Open Heart Surgery;  Laterality: N/A;  . TEE WITHOUT CARDIOVERSION N/A 04/05/2015   Procedure: TRANSESOPHAGEAL ECHOCARDIOGRAM (TEE);  Surgeon: [name], MD;  Location: [employer];  Service: Open Heart Surgery;  Laterality: N/A;    ROS- all systems are reviewed and negatives except as per HPI above  Current Outpatient Medications  Medication Sig Dispense Refill  . amoxicillin (AMOXIL) 500 MG tablet Take 4 tablets (2,000 mg total) by mouth as directed. 1 hour prior to dental work 4 tablet 3  . furosemide (  LASIX) 40 MG tablet Take 1 tablet (40 mg total) by mouth daily. 30 tablet 11  . hydrALAZINE (APRESOLINE) 25 MG tablet Take 1 tablet (25 mg total) by mouth 3 (three) times daily. 270 tablet 3  . isosorbide mononitrate (IMDUR) 60 MG 24 hr tablet Take 1 tablet (60 mg total) by mouth daily. 60 tablet 4  . sacubitril-valsartan (ENTRESTO) 97-103 MG Take 1 tablet by mouth 2 (two) times  daily. 180 tablet 3  . spironolactone (ALDACTONE) 25 MG tablet TAKE ONE TABLET BY MOUTH DAILY 90 tablet 3  . warfarin (COUMADIN) 5 MG tablet TAKE 1 AND 1/2 TO 2 TABLETS BY MOUTH DAILY AS DIRECTED BY COUMADIN CLINIC 55 tablet 1   No current facility-administered medications for this visit.    Physical Exam: Vitals:   02/24/21 1115  BP: 128/82  Pulse: 67  SpO2: 95%  Weight: 272 lb (123.4 kg)  Height: 5\' 11"  (1.803 m)    GEN- The patient is very fit appearing, very pleasant today, alert and oriented x 3 today.   Head- normocephalic, atraumatic Eyes-  Sclera clear, conjunctiva pink Ears- hearing intact Oropharynx- clear Lungs-  , normal work of breathing Heart- irregular rate and rhythm  GI- soft,  Extremities- no clubbing, cyanosis, or edema  Wt Readings from Last 3 Encounters:  02/24/21 272 lb (123.4 kg)  02/09/21 268 lb 3.2 oz (121.7 kg)  01/17/21 266 lb 9.6 oz (120.9 kg)    EKG tracing ordered today is personally reviewed and shows afib, V rate 67 bpm, IVCD with QRS 168 msec  Assessment and Plan:  1. Nonischemic CM The patient has a nonischemic CM (EF 20%), NYHA Class I-II CHF, and IVCD with QRS > 150 msec.  He is referred by Dr Haroldine Laws for risk stratification of sudden death and consideration of ICD implantation.  At this time, he meets MADIT II/ SCD-HeFT criteria for ICD implantation for primary prevention of sudden death.  He has a QRS > 150 msec with IVCD and therefore is also a candidate for CRT.  I have had a thorough discussion with the patient reviewing options.  The patient has had opportunities to ask questions and have them answered. The patient is very clear that he is no interested in ICD implantation at this time.  We have therefore decided together through a shared decision making process to not proceed with BiV ICD at this time.   Risks, benefits, alternatives to ICD implantation were discussed in detail with the patient today. The patient reports that he had  very substantial complications with his prior AVR procedure and is therefore not interested in any elective surgery at this time.  2. Longstanding persistent afib Rate controlled On coumadin  Return as needed  Thompson Grayer MD, [facility] 02/24/2021 11:26 AM

## 2021-03-02 ENCOUNTER — Other Ambulatory Visit: Payer: Self-pay | Admitting: Cardiology

## 2021-03-04 ENCOUNTER — Telehealth (HOSPITAL_COMMUNITY): Payer: Self-pay | Admitting: Licensed Clinical Social Worker

## 2021-03-04 NOTE — Telephone Encounter (Signed)
CSW forwarded VM from triage from pt requesting help finding an in network PCP.  CSW able to complete provider search for pt [facility] plan and emailed to patient.  Will continue to follow and assist as needed  Jorge Ny, [facility] Desk#: [phone] Cell#: [phone]

## 2021-03-15 NOTE — Research (Signed)
  Avoiding Treatment in the Hospital with Furoscix for the Management of Congestion in Heart Failure - A Pilot Study  AT [facility] Pilot   Visit Day: Day 7    Subject Number: [id number]    Date: January 31st, 2022    Respiration Rate:  (bpm) 20 ReDS:  (%) 27

## 2021-03-16 NOTE — Research (Signed)
  Avoiding Treatment in the Hospital with Furoscix for the Management of Congestion in Heart Failure - A Pilot Study  AT HOME-HF Pilot   Visit Day: day 1    Subject Number:[id number]    Date:01/11/2021   Vital Signs: Respiration Rate:  14 ReDS:  (%) 34

## 2021-03-16 NOTE — Research (Signed)
  Avoiding Treatment in the Hospital with Furoscix for the Management of Congestion in Heart Failure - A Pilot Study  AT HOME-HF Pilot   Visit Day: Day 0    Subject Number:[id number]   Date:_01/24/2022   Vital Signs: Respiration Rate:  16 ReDS:  (%) 35

## 2021-03-22 ENCOUNTER — Other Ambulatory Visit (HOSPITAL_COMMUNITY): Payer: Self-pay | Admitting: Internal Medicine

## 2021-03-30 ENCOUNTER — Other Ambulatory Visit: Payer: Self-pay

## 2021-03-30 ENCOUNTER — Ambulatory Visit (INDEPENDENT_AMBULATORY_CARE_PROVIDER_SITE_OTHER): Payer: Medicare Other | Admitting: Pharmacist

## 2021-03-30 DIAGNOSIS — I4892 Unspecified atrial flutter: Secondary | ICD-10-CM

## 2021-03-30 DIAGNOSIS — Z5181 Encounter for therapeutic drug level monitoring: Secondary | ICD-10-CM | POA: Diagnosis not present

## 2021-03-30 DIAGNOSIS — Q231 Congenital insufficiency of aortic valve: Secondary | ICD-10-CM

## 2021-03-30 DIAGNOSIS — I240 Acute coronary thrombosis not resulting in myocardial infarction: Secondary | ICD-10-CM | POA: Diagnosis not present

## 2021-03-30 LAB — POCT INR: INR: 2.5 (ref 2.0–3.0)

## 2021-03-30 NOTE — Patient Instructions (Addendum)
Description   Ccontinue same dosage 1.5 tablets daily except for 2 tablets on Mondays, Wednesday and Fridays. Be consistent with your greens. Get INR rechecked in 5 weeks. [phone].

## 2021-04-05 ENCOUNTER — Telehealth (HOSPITAL_COMMUNITY): Payer: Self-pay | Admitting: Pharmacy Technician

## 2021-04-05 MED ORDER — EMPAGLIFLOZIN 10 MG PO TABS: 10.0000 mg | ORAL_TABLET | Freq: Every day | ORAL | 6 refills | Status: DC

## 2021-04-05 NOTE — Telephone Encounter (Signed)
Called [employer] to check the status of the patient's application. The last time I called in, they stated the patient needed to send in POI. Representative stated that the RX needed to be updated. It looked like the year was not valid that was on the script. I was able to fix that verbally on the phone with their pharmacy.  Now the application will be updated, the representative stated she saw no other issues. The application process should resume now.

## 2021-04-06 NOTE — Telephone Encounter (Signed)
Advanced Heart Failure Patient Advocate Encounter   Patient was approved to receive Jardiance from [place]  Effective dates: 04/06/21 through 12/17/21  Called and spoke with the patient.   Charlann Boxer, CPhT

## 2021-04-25 ENCOUNTER — Ambulatory Visit: Payer: Medicare Other | Admitting: Family Medicine

## 2021-04-27 ENCOUNTER — Other Ambulatory Visit: Payer: Self-pay

## 2021-04-27 ENCOUNTER — Encounter: Payer: Self-pay | Admitting: Nurse Practitioner

## 2021-04-27 ENCOUNTER — Ambulatory Visit: Payer: Medicare (Managed Care) | Admitting: Nurse Practitioner

## 2021-04-27 VITALS — BP 130/70 | HR 60 | Temp 97.0°F | Wt 266.2 lb

## 2021-04-27 DIAGNOSIS — R159 Full incontinence of feces: Secondary | ICD-10-CM | POA: Diagnosis not present

## 2021-04-27 NOTE — Progress Notes (Signed)
   Subjective:  Patient ID: [name], male    DOB: [birth year]  Age: 60 y.o. MRN: [id number]  CC: Acute Visit (Pt c/o of skin tags around rectum that he would like looked at. )  HPI [name] present with rectal irritation since external hemorrhoid removal (unsure date of surgery). He also has occasional stool incontinence. Denies any rectal intercourse. He denies any constipation or diarrhea or hematochezia or rectal pain or fever. Reviewed 2021 colonoscopy report: no mention of external hemorrhoid. He plans to rel[facility] to [facility] in [facility], so he will establish pcp then  Reviewed past Medical, Social and Family history today.  Outpatient Medications Prior to Visit  Medication Sig Dispense Refill  . empagliflozin (JARDIANCE) 10 MG TABS tablet Take 1 tablet (10 mg total) by mouth daily before breakfast. 30 tablet 6  . furosemide (LASIX) 40 MG tablet Take 1 tablet (40 mg total) by mouth daily. 30 tablet 11  . isosorbide mononitrate (IMDUR) 60 MG 24 hr tablet Take 1 tablet (60 mg total) by mouth daily. 60 tablet 4  . sacubitril-valsartan (ENTRESTO) 97-103 MG Take 1 tablet by mouth 2 (two) times daily. 180 tablet 3  . spironolactone (ALDACTONE) 25 MG tablet TAKE ONE TABLET BY MOUTH DAILY 90 tablet 3  . warfarin (COUMADIN) 5 MG tablet Take 1 and 1/2 tablets by mouth daily except 2 tablets on Monday, Wednesday, and Friday or as directed by Anticoagulation Clinic. 55 tablet 1  . amoxicillin (AMOXIL) 500 MG tablet Take 4 tablets (2,000 mg total) by mouth as directed. 1 hour prior to dental work (Patient not taking: Reported on 04/27/2021) 4 tablet 3  . hydrALAZINE (APRESOLINE) 25 MG tablet Take 1 tablet (25 mg total) by mouth 3 (three) times daily. 270 tablet 3   No facility-administered medications prior to visit.    ROS See HPI  Objective:  BP 130/70 (BP Location: Left Arm, Patient Position: Sitting, Cuff Size: Large)   Pulse 60   Temp (!) 97 F (36.1 C) (Temporal)   Wt 266  lb 3.2 oz (120.7 kg)   SpO2 98%   BMI 37.13 kg/m   Physical Exam Exam conducted with a chaperone present.  Abdominal:     Tenderness: There is no abdominal tenderness.  Genitourinary:    Rectum: No mass, tenderness, anal fissure, external hemorrhoid or internal hemorrhoid. Abnormal anal tone.      Assessment & Plan:  This visit occurred during the SARS-CoV-2 public health emergency.  Safety protocols were in place, including screening questions prior to the visit, additional usage of staff PPE, and extensive cleaning of exam room while observing appropriate contact time as indicated for disinfecting solutions.   [name] was seen today for acute visit.  Diagnoses and all orders for this visit:  Rectal sphincter incontinence -     Ambulatory referral to General Surgery   Problem List Items Addressed This Visit   None   Visit Diagnoses    Rectal sphincter incontinence    -  Primary   Relevant Orders   Ambulatory referral to General Surgery      Follow-up: No follow-ups on file.  Wilfred Lacy, NP

## 2021-04-27 NOTE — Patient Instructions (Addendum)
You will be contacted to schedule appt with general surgeon.  Hemorrhoids Hemorrhoids are swollen veins that may develop:  In the butt (rectum). These are called internal hemorrhoids.  Around the opening of the butt (anus). These are called external hemorrhoids. Hemorrhoids can cause pain, itching, or bleeding. Most of the time, they do not cause serious problems. They usually get better with diet changes, lifestyle changes, and other home treatments. What are the causes? This condition may be caused by:  Having trouble pooping (constipation).  Pushing hard (straining) to poop.  Watery poop (diarrhea).  Pregnancy.  Being very overweight (obese).  Sitting for long periods of time.  Heavy lifting or other activity that causes you to strain.  Anal sex.  Riding a bike for a long period of time. What are the signs or symptoms? Symptoms of this condition include:  Pain.  Itching or soreness in the butt.  Bleeding from the butt.  Leaking poop.  Swelling in the area.  One or more lumps around the opening of your butt. How is this diagnosed? A doctor can often diagnose this condition by looking at the affected area. The doctor may also:  Do an exam that involves feeling the area with a gloved hand (digital rectal exam).  Examine the area inside your butt using a small tube (anoscope).  Order blood tests. This may be done if you have lost a lot of blood.  Have you get a test that involves looking inside the colon using a flexible tube with a camera on the end (sigmoidoscopy or colonoscopy). How is this treated? This condition can usually be treated at home. Your doctor may tell you to change what you eat, make lifestyle changes, or try home treatments. If these do not help, procedures can be done to remove the hemorrhoids or make them smaller. These may involve:  Placing rubber bands at the base of the hemorrhoids to cut off their blood supply.  Injecting medicine into  the hemorrhoids to shrink them.  Shining a type of light energy onto the hemorrhoids to cause them to fall off.  Doing surgery to remove the hemorrhoids or cut off their blood supply. Follow these instructions at home: Eating and drinking  Eat foods that have a lot of fiber in them. These include whole grains, beans, nuts, fruits, and vegetables.  Ask your doctor about taking products that have added fiber (fibersupplements).  Reduce the amount of fat in your diet. You can do this by: ? Eating low-fat dairy products. ? Eating less red meat. ? Avoiding processed foods.  Drink enough fluid to keep your pee (urine) pale yellow.   Managing pain and swelling  Take a warm-water bath (sitz bath) for 20 minutes to ease pain. Do this 3-4 times a day. You may do this in a bathtub or using a portable sitz bath that fits over the toilet.  If told, put ice on the painful area. It may be helpful to use ice between your warm baths. ? Put ice in a plastic bag. ? Place a towel between your skin and the bag. ? Leave the ice on for 20 minutes, 2-3 times a day.   General instructions  Take over-the-counter and prescription medicines only as told by your doctor. ? Medicated creams and medicines may be used as told.  Exercise often. Ask your doctor how much and what kind of exercise is best for you.  Go to the bathroom when you have the urge to poop. Do  not wait.  Avoid pushing too hard when you poop.  Keep your butt dry and clean. Use wet toilet paper or moist towelettes after pooping.  Do not sit on the toilet for a long time.  Keep all follow-up visits as told by your doctor. This is important. Contact a doctor if you:  Have pain and swelling that do not get better with treatment or medicine.  Have trouble pooping.  Cannot poop.  Have pain or swelling outside the area of the hemorrhoids. Get help right away if you have:  Bleeding that will not stop. Summary  Hemorrhoids are  swollen veins in the butt or around the opening of the butt.  They can cause pain, itching, or bleeding.  Eat foods that have a lot of fiber in them. These include whole grains, beans, nuts, fruits, and vegetables.  Take a warm-water bath (sitz bath) for 20 minutes to ease pain. Do this 3-4 times a day. This information is not intended to replace advice given to you by your health care provider. Make sure you discuss any questions you have with your health care provider. Document Revised: 12/12/2018 Document Reviewed: 04/25/2018 Elsevier Patient Education  [place].

## 2021-05-02 ENCOUNTER — Encounter (HOSPITAL_COMMUNITY): Payer: Self-pay

## 2021-05-02 ENCOUNTER — Emergency Department (HOSPITAL_COMMUNITY): Payer: Medicare (Managed Care)

## 2021-05-02 ENCOUNTER — Telehealth: Payer: Self-pay | Admitting: Nurse Practitioner

## 2021-05-02 ENCOUNTER — Telehealth (HOSPITAL_COMMUNITY): Payer: Self-pay | Admitting: Licensed Clinical Social Worker

## 2021-05-02 ENCOUNTER — Emergency Department (HOSPITAL_COMMUNITY)
Admission: EM | Admit: 2021-05-02 | Discharge: 2021-05-03 | Disposition: A | Discharge status: Home / Self Care | Payer: Medicare (Managed Care) | Attending: Emergency Medicine | Admitting: Emergency Medicine

## 2021-05-02 DIAGNOSIS — Z7901 Long term (current) use of anticoagulants: Secondary | ICD-10-CM | POA: Diagnosis not present

## 2021-05-02 DIAGNOSIS — M79671 Pain in right foot: Secondary | ICD-10-CM | POA: Diagnosis present

## 2021-05-02 DIAGNOSIS — N183 Chronic kidney disease, stage 3 unspecified: Secondary | ICD-10-CM | POA: Diagnosis not present

## 2021-05-02 DIAGNOSIS — E1122 Type 2 diabetes mellitus with diabetic chronic kidney disease: Secondary | ICD-10-CM | POA: Diagnosis not present

## 2021-05-02 DIAGNOSIS — Z79899 Other long term (current) drug therapy: Secondary | ICD-10-CM | POA: Insufficient documentation

## 2021-05-02 DIAGNOSIS — I130 Hypertensive heart and chronic kidney disease with heart failure and stage 1 through stage 4 chronic kidney disease, or unspecified chronic kidney disease: Secondary | ICD-10-CM | POA: Diagnosis not present

## 2021-05-02 DIAGNOSIS — R059 Cough, unspecified: Secondary | ICD-10-CM | POA: Insufficient documentation

## 2021-05-02 DIAGNOSIS — R509 Fever, unspecified: Secondary | ICD-10-CM | POA: Diagnosis not present

## 2021-05-02 DIAGNOSIS — Z20822 Contact with and (suspected) exposure to covid-19: Secondary | ICD-10-CM | POA: Insufficient documentation

## 2021-05-02 DIAGNOSIS — I5042 Chronic combined systolic (congestive) and diastolic (congestive) heart failure: Secondary | ICD-10-CM | POA: Insufficient documentation

## 2021-05-02 DIAGNOSIS — M79672 Pain in left foot: Secondary | ICD-10-CM

## 2021-05-02 LAB — CBC
HCT: 47 % (ref 39.0–52.0)
Hemoglobin: 15.1 g/dL (ref 13.0–17.0)
MCH: 27.7 pg (ref 26.0–34.0)
MCHC: 32.1 g/dL (ref 30.0–36.0)
MCV: 86.1 fL (ref 80.0–100.0)
Platelets: 249 10*3/uL (ref 150–400)
RBC: 5.46 MIL/uL (ref 4.22–5.81)
RDW: 15.2 % (ref 11.5–15.5)
WBC: 11.3 10*3/uL — ABNORMAL HIGH (ref 4.0–10.5)
nRBC: 0 % (ref 0.0–0.2)

## 2021-05-02 LAB — BASIC METABOLIC PANEL
Anion gap: 8 (ref 5–15)
BUN: 19 mg/dL (ref 6–20)
CO2: 24 mmol/L (ref 22–32)
Calcium: 9.3 mg/dL (ref 8.9–10.3)
Chloride: 103 mmol/L (ref 98–111)
Creatinine, Ser: 1.7 mg/dL — ABNORMAL HIGH (ref 0.61–1.24)
GFR, Estimated: 46 mL/min — ABNORMAL LOW (ref >60)
Glucose, Bld: 126 mg/dL — ABNORMAL HIGH (ref 70–99)
Potassium: 4.2 mmol/L (ref 3.5–5.1)
Sodium: 135 mmol/L (ref 135–145)

## 2021-05-02 LAB — BRAIN NATRIURETIC PEPTIDE: B Natriuretic Peptide: 963.4 pg/mL — ABNORMAL HIGH (ref 0.0–100.0)

## 2021-05-02 MED ORDER — ACETAMINOPHEN 325 MG PO TABS
650.0000 mg | ORAL_TABLET | Freq: Once | ORAL | Status: AC
  Administered 2021-05-02: 650 mg via ORAL
  Filled 2021-05-02: qty 2

## 2021-05-02 NOTE — Telephone Encounter (Signed)
CSW received call from pt stating that he has been coughing since Friday and is having foot pain (thinks this might be a gout flare up).  Reports no swelling in his feet and that he has actually be losing weight recently.  Discussed with triage and no concerns of heart failure exacerbation based on pt reported symptoms.  CSW encouraged pt to call his PCP office- provided him with their phone number.  Will continue to follow and assist as needed  [name], [facility] Desk#: [phone] Cell#: [phone]

## 2021-05-02 NOTE — Telephone Encounter (Signed)
Pt called in complaining of his feet hurting so bad he can not walk. He also is coughing a lot and very congested. I offered a Mychart video appointment for him tomorrow(05/03/21) for his coughing. He declined he asked me to put a phone message in for an antibiotic for this issue. I transferred him over to Nurse Triage for his feet hurting so bad and not being able to walk.

## 2021-05-02 NOTE — ED Provider Notes (Signed)
Emergency Medicine Provider Triage Evaluation Note  [name] , a 60 y.o. male  was evaluated in triage.  Pt complains of bilateral feet swelling x 15 days along with chest congestion with white phlegm for the past week. Febrile on arrival to the ED.Has not taken any medication. Swelling to BL feet is so severe he cannot ambulate.   Review of Systems  Positive: Shortness of breath, cough, bilateral feet swelling Negative: Chest pain, calf tenderness  Physical Exam  BP 113/71 (BP Location: Left Arm)   Pulse 72   Temp (!) 101.5 F (38.6 C) (Oral)   Resp 16   SpO2 96%  Gen:   Awake, no distress   Resp:  Normal effort  MSK:   Moves extremities without difficulty  Other:  Lungs are diminished to auscultation. No TTP along the chest wall. 1+ pitting edema BL without calf tenderness.   Medical Decision Making  Medically screening exam initiated at 8:33 PM.  Appropriate orders placed.  KAYIN OSMENT was informed that the remainder of the evaluation will be completed by another provider, this initial triage assessment does not replace that evaluation, and the importance of remaining in the ED until their evaluation is complete.  Patient here with bilateral feet swelling for the past 15 days. Prior hx of CHF. Also SOB with productive cough with white phlegm.  Febrile on arrival, with Tylenol.  Orders placed.    Janeece Fitting, PA-C 05/02/21 2037    Valarie Merino, MD 05/03/21 [phone]

## 2021-05-02 NOTE — ED Triage Notes (Signed)
Pt reports that for the past 2 weeks he has been having bilateral foot pain with swelling and unable to walk, pt has also had a productive cough with white phlegm, fevers at home, did not take anything. Denies CP

## 2021-05-03 LAB — RESP PANEL BY RT-PCR (FLU A&B, COVID) ARPGX2
Influenza A by PCR: NEGATIVE
Influenza B by PCR: NEGATIVE
SARS Coronavirus 2 by RT PCR: NEGATIVE

## 2021-05-03 LAB — URINALYSIS, ROUTINE W REFLEX MICROSCOPIC
Bacteria, UA: NONE SEEN
Bilirubin Urine: NEGATIVE
Glucose, UA: >=500 mg/dL — AB
Hgb urine dipstick: NEGATIVE
Ketones, ur: NEGATIVE mg/dL
Leukocytes,Ua: NEGATIVE
Nitrite: NEGATIVE
Protein, ur: 100 mg/dL — AB
Specific Gravity, Urine: 1.021 (ref 1.005–1.030)
pH: 5 (ref 5.0–8.0)

## 2021-05-03 LAB — CK: Total CK: 206 U/L (ref 49–397)

## 2021-05-03 LAB — PROTIME-INR
INR: 1.8 — ABNORMAL HIGH (ref 0.8–1.2)
Prothrombin Time: 20.8 seconds — ABNORMAL HIGH (ref 11.4–15.2)

## 2021-05-03 LAB — LACTIC ACID, PLASMA: Lactic Acid, Venous: 1 mmol/L (ref 0.5–1.9)

## 2021-05-03 LAB — URIC ACID: Uric Acid, Serum: 10.3 mg/dL — ABNORMAL HIGH (ref 3.7–8.6)

## 2021-05-03 MED ORDER — GABAPENTIN 100 MG PO CAPS: 100.0000 mg | ORAL_CAPSULE | Freq: Three times a day (TID) | ORAL | 0 refills | Status: DC

## 2021-05-03 MED ORDER — GABAPENTIN 100 MG PO CAPS
100.0000 mg | ORAL_CAPSULE | Freq: Once | ORAL | Status: AC
  Administered 2021-05-03: 100 mg via ORAL
  Filled 2021-05-03: qty 1

## 2021-05-03 NOTE — Discharge Instructions (Addendum)
Your work-up today was overall reassuring.   Your fever may be due to viral illness/cold. Foot pain could be due to neuropathy or from your jardiance.  Recommend that you talk to your doctor about this further.  I would call and schedule an appointment. In the meantime, can try the gabapentin to see if it helps with pain. Return here for new concerns.

## 2021-05-03 NOTE — ED Provider Notes (Signed)
[facility] EMERGENCY DEPARTMENT Provider Note   CSN: [id number] Arrival date & time: 05/02/21  1829     History Chief Complaint  Patient presents with  . Foot Pain  . Cough    [name] is a 60 y.o. male.  The history is provided by the patient and medical records.    60 year old male with history of congestive heart failure with EF 20 to 25%, chronic kidney disease stage III, diabetes, hypertension, gout, hyperlipidemia, iron deficiency anemia, presenting to the ED with multiple concerns.  States for the past 2 weeks or so he has been experiencing a lot of pain and intermittent swelling in his feet.  He initially thought this was a gout flare.  He reports he was started on some medication for this but has not had any improvement.  States now it is getting unbearable to the point where he is having a hard time walking and pain is now into lower legs as well.  He also reports cough with white phlegm over the past few days.  His roommate has been sick with URI type symptoms as well.  He does report some chills yesterday.  He denies any known covid exposures.  He has been fully vaccinated with booster.  Patient did have fever on arrival to ED but unaware of this.  He denies chest pain or SOB, no issues lying flat, and no weight gain recently.  States he has been complaint with this medications.   No vomiting, diarrhea, abdominal pain.  Has been eating/drinking well.  Does report dark colored urine but denies dysuria.  Past Medical History:  Diagnosis Date  . Acute gastric ulcer with hemorrhage   . Acute respiratory failure ([place]) 05/11/2019  . Aortic valve disease    a. 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 Atrial flutter ([place])   . Benign hypertensive heart and renal disease   . Cardiogenic shock ([place])   . Chronic combined systolic and diastolic CHF (congestive heart failure) ([place])   . Chronic  kidney disease (CKD), stage III (moderate) ([facility])   . COVID-19   . Diabetes mellitus ([place])   . Essential hypertension 11/19/2014  . Gout   . Hx of colonic polyps 11/11/2019  . Hyperlipidemia   . Insomnia   . Iron deficiency anemia   . Murmur   . Noncompliance   . Obesity   . Pseudomonas pneumonia ([place]) 05/16/2019  . Pulmonary hypertension [facility])     Patient Active Problem List   Diagnosis Date Noted  . Abdominal distention 04/06/2020  . Generalized abdominal pain 04/06/2020  . Change in bowel habits 04/06/2020  . Hx of colonic polyps 11/11/2019  . Anemia, iron deficiency 11/05/2019  . LV (left ventricular) mural thrombus without MI ([place]) 06/19/2019  . Internal and external bleeding hemorrhoids   . S/P AVR 04/05/2015  . Aortic stenosis 03/29/2015  . Bicuspid aortic valve 03/29/2015  . Acute systolic CHF (congestive heart failure) ([place]) 03/27/2015  . Aortic insufficiency 11/19/2014  . Congestive dilated cardiomyopathy ([place]) 11/19/2014  . Essential hypertension 11/19/2014  . 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 Chronic systolic CHF (congestive heart failure) ([place]) 11/19/2014  . SOB (shortness of breath) 11/19/2014  . Atrial flutter ([place]) 11/19/2014    Past Surgical History:  Procedure Laterality Date  . AORTIC VALVE REPLACEMENT N/A 04/05/2015   Procedure: AORTIC VALVE REPLACEMENT (AVR) using a 79mm Edwards Aortic Magna Ease Valve ;  Surgeon: [name], MD;  Location: [facility];  Service: Open Heart Surgery;  Laterality: N/A;  . COLONOSCOPY WITH PROPOFOL N/A 11/05/2019   Procedure: COLONOSCOPY WITH PROPOFOL;  Surgeon: [name], MD;  Location: [facility] ENDOSCOPY;  Service: Endoscopy;  Laterality: N/A;  . ESOPHAGOGASTRODUODENOSCOPY (EGD) WITH PROPOFOL N/A 05/19/2019   Procedure: ESOPHAGOGASTRODUODENOSCOPY (EGD) WITH PROPOFOL;  Surgeon: [name], MD;  Location: [facility];  Service: Gastroenterology;  Laterality: N/A;  . HEMOSTASIS CLIP PLACEMENT   05/19/2019   Procedure: HEMOSTASIS CLIP PLACEMENT;  Surgeon: [name], MD;  Location: [facility] ENDOSCOPY;  Service: Gastroenterology;;  . LEFT AND RIGHT HEART CATHETERIZATION WITH CORONARY ANGIOGRAM N/A 11/27/2014   Procedure: LEFT AND RIGHT HEART CATHETERIZATION WITH CORONARY ANGIOGRAM;  Surgeon: [name], MD;  Location: [facility] CATH LAB;  Service: Cardiovascular;  Laterality: N/A;  . MAZE N/A 04/05/2015   Procedure: MAZE;  Surgeon: [name], MD;  Location: [facility];  Service: Open Heart Surgery;  Laterality: N/A;  . POLYPECTOMY  11/05/2019   Procedure: POLYPECTOMY;  Surgeon: [name], MD;  Location: [facility] ENDOSCOPY;  Service: Endoscopy;;  . REPLACEMENT ASCENDING AORTA N/A 04/05/2015   Procedure: REPLACEMENT ASCENDING AORTA with a 42mm Hemashield Platinum Graft;  Surgeon: [name], MD;  Location: [facility];  Service: Open Heart Surgery;  Laterality: N/A;  . TEE WITHOUT CARDIOVERSION N/A 04/05/2015   Procedure: TRANSESOPHAGEAL ECHOCARDIOGRAM (TEE);  Surgeon: [name], MD;  Location: [facility];  Service: Open Heart Surgery;  Laterality: N/A;       Family History  Problem Relation Age of Onset  . Other Mother        MVA  . Liver disease Cousin        cirrhosis; alcohol related  . Heart attack Neg Hx   . Colon cancer Neg Hx   . Esophageal cancer Neg Hx   . Stomach cancer Neg Hx   . Pancreatic cancer Neg Hx     Social History   Tobacco Use  . Smoking status: Never Smoker  . Smokeless tobacco: Never Used  Vaping Use  . Vaping Use: Never used  Substance Use Topics  . Alcohol use: Yes    Comment: 5 monthly  . Drug use: Never    Home Medications Prior to Admission medications   Medication Sig Start Date End Date Taking? Authorizing Provider  amoxicillin (AMOXIL) 500 MG tablet Take 4 tablets (2,000 mg total) by mouth as directed. 1 hour prior to dental work Patient not taking: Reported on 04/27/2021 02/11/21   Bensimhon, Shaune Pascal, MD  empagliflozin (JARDIANCE) 10 MG  TABS tablet Take 1 tablet (10 mg total) by mouth daily before breakfast. 04/05/21   Bensimhon, Shaune Pascal, MD  furosemide (LASIX) 40 MG tablet Take 1 tablet (40 mg total) by mouth daily. 01/13/21   Clegg, Amy D, NP  hydrALAZINE (APRESOLINE) 25 MG tablet Take 1 tablet (25 mg total) by mouth 3 (three) times daily. 01/10/21 04/10/21  Bensimhon, Shaune Pascal, MD  isosorbide mononitrate (IMDUR) 60 MG 24 hr tablet Take 1 tablet (60 mg total) by mouth daily. 01/10/21   Bensimhon, Shaune Pascal, MD  sacubitril-valsartan (ENTRESTO) 97-103 MG Take 1 tablet by mouth 2 (two) times daily. 05/25/20   Bensimhon, Shaune Pascal, MD  spironolactone (ALDACTONE) 25 MG tablet TAKE ONE TABLET BY MOUTH DAILY Patient taking differently: Take 25 mg by mouth daily. 02/01/21   Bensimhon, Shaune Pascal, MD  warfarin (COUMADIN) 5 MG tablet Take 1 and 1/2 tablets  by mouth daily except 2 tablets on Monday, Wednesday, and Friday or as directed by Anticoagulation Clinic. 03/03/21   Sueanne Margarita, MD    Allergies    Patient has no known allergies.  Review of Systems   Review of Systems  Constitutional: Positive for fever.  Respiratory: Positive for cough.   Musculoskeletal: Positive for arthralgias.  All other systems reviewed and are negative.   Physical Exam Updated Vital Signs BP 111/63 (BP Location: Left Arm)   Pulse 65   Temp 98.7 F (37.1 C) (Oral)   Resp 18   SpO2 97%   Physical Exam Vitals and nursing note reviewed.  Constitutional:      Appearance: He is well-developed.  HENT:     Head: Normocephalic and atraumatic.  Eyes:     Conjunctiva/sclera: Conjunctivae normal.     Pupils: Pupils are equal, round, and reactive to light.  Cardiovascular:     Rate and Rhythm: Normal rate and regular rhythm.     Heart sounds: Normal heart sounds.  Pulmonary:     Effort: Pulmonary effort is normal.     Breath sounds: Normal breath sounds. No wheezing or rhonchi.     Comments: Hacking cough on exam, no signs of distress, able to speak in  full sentences without difficulty Abdominal:     General: Bowel sounds are normal.     Palpations: Abdomen is soft.  Musculoskeletal:        General: Normal range of motion.     Cervical back: Normal range of motion.     Comments: Very old appearing scab to left mid-shin, chronic per patient Both socks removed-- some trace edema around ankles but no overlying erythema, induration, or open wounds of the feet, no pitting edema up the legs  Skin:    General: Skin is warm and dry.  Neurological:     Mental Status: He is alert and oriented to person, place, and time.     ED Results / Procedures / Treatments   Labs (all labs ordered are listed, but only abnormal results are displayed) Labs Reviewed  CBC - Abnormal; Notable for the following components:      Result Value   WBC 11.3 (*)    All other components within normal limits  BASIC METABOLIC PANEL - Abnormal; Notable for the following components:   Glucose, Bld 126 (*)    Creatinine, Ser 1.70 (*)    GFR, Estimated 46 (*)    All other components within normal limits  BRAIN NATRIURETIC PEPTIDE - Abnormal; Notable for the following components:   B Natriuretic Peptide 963.4 (*)    All other components within normal limits  URIC ACID - Abnormal; Notable for the following components:   Uric Acid, Serum 10.3 (*)    All other components within normal limits  URINALYSIS, ROUTINE W REFLEX MICROSCOPIC - Abnormal; Notable for the following components:   Glucose, UA >=500 (*)    Protein, ur 100 (*)    All other components within normal limits  PROTIME-INR - Abnormal; Notable for the following components:   Prothrombin Time 20.8 (*)    INR 1.8 (*)    All other components within normal limits  RESP PANEL BY RT-PCR (FLU A&B, COVID) ARPGX2  CULTURE, BLOOD (ROUTINE X 2)  CULTURE, BLOOD (ROUTINE X 2)  CK  LACTIC ACID, PLASMA    EKG EKG Interpretation  Date/Time:  Tuesday May 03 2021 03:26:52 EDT Ventricular Rate:  70 PR Interval:  QRS Duration: 191 QT Interval:  482 QTC Calculation: 521 R Axis:   -83 Text Interpretation: Atrial fibrillation IVCD, consider atypical RBBB Abnormal T, consider ischemia, lateral leads Confirmed by Orpah Greek [phone]) on 05/03/2021 4:13:20 AM   Radiology DG Chest 2 View  Result Date: 05/02/2021 CLINICAL DATA:  Shortness of breath EXAM: CHEST - 2 VIEW COMPARISON:  01/10/2021 FINDINGS: Post sternotomy changes and valve prosthesis. Moderate cardiomegaly without edema, pleural effusion, pneumothorax or focal airspace disease. IMPRESSION: No active cardiopulmonary disease.  Cardiomegaly Electronically Signed   By: Donavan Foil M.D.   On: 05/02/2021 21:15    Procedures Procedures   Medications Ordered in ED Medications  acetaminophen (TYLENOL) tablet 650 mg (650 mg Oral Given 05/02/21 2037)  gabapentin (NEURONTIN) capsule 100 mg (100 mg Oral Given 05/03/21 0602)    ED Course  I have reviewed the triage vital signs and the nursing notes.  Pertinent labs & imaging results that were available during my care of the patient were reviewed by me and considered in my medical decision making (see chart for details).    MDM Rules/Calculators/A&P  60 y.o. M here with multiple complaints-- bilateral foot pain, cough, and new fever on arrival to ED.  Unaware of fevers at home but reports roommate sick with URI and he did have chills yesterday.  He is afebrile by time of my evaluation after tylenol in triage.  He is in NAD.  Does have dry, hacking cough on exam without signs of respiratory distress.  His feet are actually normal in appearance without open wounds/sores, erythema, induration, or cellulitis.  He has trace edema at the ankles without pitting.   Labs from triage as above-- normal WBC count, chemistry appears at baseline, BNP 963 but this is also near his baseline compared with prior.  Clinically, he actually does not appear fluid overloaded and has no SOB/chest pain, VSS on RA.   Given fever, will proceed with further infectious work-up.  Also reported recent treatment for gout and dark colored urine-- add uric acid, UA, and CK as well.  Lactate 1.0.  CK WNL.  Uric acid 10.3. covid screen negative.  UA is also negative for infection.  Fever may be viral in origin given his cough and sick contacts with similar.  Blood cultures have been sent.  In regards to his feet, his uric acid is mildly elevated but findings are exam are not overly concerning for gout and this is occurring in both feet, multiple joint areas.  May simply be neuropathy related to his DM.  He was also recently started on jardiance which can cause joint pain in some instances so this must be considered as well.  He has remained HD stable here in ED without recurrence of fever.  No clinical signs of sepsis at this time.  Feel he is stable for discharge home.  Will give trial of gabapentin to see if this helps his foot pain, but will need close follow-up with PCP.  May return here for any new/acute changes.  Shared visit with attending physician, Dr. Betsey Holiday, who agrees with treatment plan.  Final Clinical Impression(s) / ED Diagnoses Final diagnoses:  Fever, unspecified fever cause  Pain in both feet  Cough    Rx / DC Orders ED Discharge Orders         Ordered    gabapentin (NEURONTIN) 100 MG capsule  3 times daily        05/03/21 0559  Larene Pickett, PA-C 05/03/21 2919    Orpah Greek, MD 05/03/21 [phone]

## 2021-05-03 NOTE — Telephone Encounter (Signed)
Pt states he has to handle other issues and will schedule a appointment once he settles things with the hospital that he feels mistreated him.

## 2021-05-03 NOTE — ED Notes (Signed)
Medications follow up appts reviewed w/ pt. Pt became very irritated w/ this RN. Stating "oh I see how you do people who telling you to kick me out." Pt made aware that no one was kicking him out he was discharge. Pt educated on Rx for gabapentin. Pt again stated "I'm still coughing up mucous. RN attempted education on tx of URI. Pt stated "Im not mad at you buth this is wrong. RN went and obtained wheelchair for pt. Pt dressed self independently and sitting on side of bed In room waiting on nurse. Pt demanding and rude. Wanted to speak to higher up. Charge RN made aware. Left in wheelchair. Denies questions or concerns @ this time. Education on s/s of worsening and when to return. Able to bear weight on both legs.  Left w/ even and steady gait. NAD noted. PIV removed and VSS.

## 2021-05-03 NOTE — Telephone Encounter (Signed)
He needs to schedule a video appt

## 2021-05-04 ENCOUNTER — Telehealth: Payer: Self-pay | Admitting: Internal Medicine

## 2021-05-04 ENCOUNTER — Telehealth: Payer: Self-pay | Admitting: *Deleted

## 2021-05-04 ENCOUNTER — Telehealth (HOSPITAL_COMMUNITY): Payer: Self-pay | Admitting: *Deleted

## 2021-05-04 NOTE — Telephone Encounter (Signed)
Called pt since he missed today's appt; had to leave a message for him to call back to reschedule.

## 2021-05-04 NOTE — Telephone Encounter (Signed)
Spoke with patient about his foot pain/ gout and his cough and fever. Pt went to ER on 5/16. From ED note: Will give trial of gabapentin to see if this helps his foot pain, but will need close follow-up with PCP. Asked patient if he contacted his PCP yet after his hospital visit and he told me no. Advised him to call his PCP for follow up. The patient became mad and hung up on me.

## 2021-05-04 NOTE — Telephone Encounter (Signed)
Encounter not needed

## 2021-05-04 NOTE — Telephone Encounter (Signed)
Pt called stating he was sent to the emergency room by pcp for possible URI and fever. Pt said his feet hurt they are not swollen per pcp note uric acid was elevated but they think it is neuropathy rather than gout. Pt still has a cough, fever, and congestion. Pt requested an antibiotic. I explained to patient that we are his heart failure doctor and that pcp would need to prescribe an antibiotic and treat gout. I called pcp to speak with a nurse and no one was available I left a message for call back to patient to address concerns.

## 2021-05-04 NOTE — Telephone Encounter (Signed)
New message:     Patient calling stating that he is having some trouble with having some gout and patient states he is in a lot of pain and and running a fever. Patient went to the ED and stated they did not give him anything and told patient to leave. Please advise.

## 2021-05-05 ENCOUNTER — Inpatient Hospital Stay (HOSPITAL_COMMUNITY)
Admission: EM | Admit: 2021-05-05 | Discharge: 2021-05-14 | DRG: 554 | Disposition: A | Discharge status: Skilled Nursing Facility | Payer: Medicare (Managed Care) | Attending: Student | Admitting: Student

## 2021-05-05 ENCOUNTER — Telehealth: Payer: Self-pay | Admitting: Nurse Practitioner

## 2021-05-05 ENCOUNTER — Emergency Department (HOSPITAL_COMMUNITY): Payer: Medicare (Managed Care)

## 2021-05-05 ENCOUNTER — Encounter (HOSPITAL_COMMUNITY): Payer: Self-pay | Admitting: Pharmacy Technician

## 2021-05-05 DIAGNOSIS — R194 Change in bowel habit: Secondary | ICD-10-CM

## 2021-05-05 DIAGNOSIS — Z79899 Other long term (current) drug therapy: Secondary | ICD-10-CM

## 2021-05-05 DIAGNOSIS — N1831 Chronic kidney disease, stage 3a: Secondary | ICD-10-CM | POA: Diagnosis present

## 2021-05-05 DIAGNOSIS — E1140 Type 2 diabetes mellitus with diabetic neuropathy, unspecified: Secondary | ICD-10-CM | POA: Diagnosis present

## 2021-05-05 DIAGNOSIS — R509 Fever, unspecified: Secondary | ICD-10-CM | POA: Diagnosis not present

## 2021-05-05 DIAGNOSIS — R52 Pain, unspecified: Secondary | ICD-10-CM

## 2021-05-05 DIAGNOSIS — Z713 Dietary counseling and surveillance: Secondary | ICD-10-CM

## 2021-05-05 DIAGNOSIS — M109 Gout, unspecified: Principal | ICD-10-CM | POA: Diagnosis present

## 2021-05-05 DIAGNOSIS — G4700 Insomnia, unspecified: Secondary | ICD-10-CM | POA: Diagnosis present

## 2021-05-05 DIAGNOSIS — E0800 Diabetes mellitus due to underlying condition with hyperosmolarity without nonketotic hyperglycemic-hyperosmolar coma (NKHHC): Secondary | ICD-10-CM

## 2021-05-05 DIAGNOSIS — D509 Iron deficiency anemia, unspecified: Secondary | ICD-10-CM | POA: Diagnosis present

## 2021-05-05 DIAGNOSIS — R079 Chest pain, unspecified: Secondary | ICD-10-CM

## 2021-05-05 DIAGNOSIS — E1122 Type 2 diabetes mellitus with diabetic chronic kidney disease: Secondary | ICD-10-CM | POA: Diagnosis present

## 2021-05-05 DIAGNOSIS — I5022 Chronic systolic (congestive) heart failure: Secondary | ICD-10-CM | POA: Diagnosis present

## 2021-05-05 DIAGNOSIS — I130 Hypertensive heart and chronic kidney disease with heart failure and stage 1 through stage 4 chronic kidney disease, or unspecified chronic kidney disease: Secondary | ICD-10-CM | POA: Diagnosis present

## 2021-05-05 DIAGNOSIS — N182 Chronic kidney disease, stage 2 (mild): Secondary | ICD-10-CM | POA: Diagnosis present

## 2021-05-05 DIAGNOSIS — E1165 Type 2 diabetes mellitus with hyperglycemia: Secondary | ICD-10-CM | POA: Diagnosis not present

## 2021-05-05 DIAGNOSIS — I10 Essential (primary) hypertension: Secondary | ICD-10-CM | POA: Diagnosis present

## 2021-05-05 DIAGNOSIS — I5043 Acute on chronic combined systolic (congestive) and diastolic (congestive) heart failure: Secondary | ICD-10-CM | POA: Diagnosis present

## 2021-05-05 DIAGNOSIS — Z953 Presence of xenogenic heart valve: Secondary | ICD-10-CM

## 2021-05-05 DIAGNOSIS — Z6836 Body mass index (BMI) 36.0-36.9, adult: Secondary | ICD-10-CM

## 2021-05-05 DIAGNOSIS — T380X5A Adverse effect of glucocorticoids and synthetic analogues, initial encounter: Secondary | ICD-10-CM | POA: Diagnosis not present

## 2021-05-05 DIAGNOSIS — Z952 Presence of prosthetic heart valve: Secondary | ICD-10-CM

## 2021-05-05 DIAGNOSIS — Z20822 Contact with and (suspected) exposure to covid-19: Secondary | ICD-10-CM | POA: Diagnosis present

## 2021-05-05 DIAGNOSIS — Z8616 Personal history of COVID-19: Secondary | ICD-10-CM

## 2021-05-05 DIAGNOSIS — Z7901 Long term (current) use of anticoagulants: Secondary | ICD-10-CM

## 2021-05-05 DIAGNOSIS — Z7984 Long term (current) use of oral hypoglycemic drugs: Secondary | ICD-10-CM

## 2021-05-05 DIAGNOSIS — I420 Dilated cardiomyopathy: Secondary | ICD-10-CM | POA: Diagnosis present

## 2021-05-05 DIAGNOSIS — E785 Hyperlipidemia, unspecified: Secondary | ICD-10-CM | POA: Diagnosis present

## 2021-05-05 DIAGNOSIS — I272 Pulmonary hypertension, unspecified: Secondary | ICD-10-CM | POA: Diagnosis present

## 2021-05-05 DIAGNOSIS — I248 Other forms of acute ischemic heart disease: Secondary | ICD-10-CM | POA: Diagnosis present

## 2021-05-05 DIAGNOSIS — E1151 Type 2 diabetes mellitus with diabetic peripheral angiopathy without gangrene: Secondary | ICD-10-CM | POA: Diagnosis present

## 2021-05-05 DIAGNOSIS — J029 Acute pharyngitis, unspecified: Secondary | ICD-10-CM | POA: Diagnosis present

## 2021-05-05 DIAGNOSIS — K7460 Unspecified cirrhosis of liver: Secondary | ICD-10-CM | POA: Diagnosis present

## 2021-05-05 LAB — CBC WITH DIFFERENTIAL/PLATELET
Abs Immature Granulocytes: 0.06 10*3/uL (ref 0.00–0.07)
Basophils Absolute: 0 10*3/uL (ref 0.0–0.1)
Basophils Relative: 0 %
Eosinophils Absolute: 0 10*3/uL (ref 0.0–0.5)
Eosinophils Relative: 0 %
HCT: 44.9 % (ref 39.0–52.0)
Hemoglobin: 14.2 g/dL (ref 13.0–17.0)
Immature Granulocytes: 1 %
Lymphocytes Relative: 9 %
Lymphs Abs: 1.1 10*3/uL (ref 0.7–4.0)
MCH: 27.7 pg (ref 26.0–34.0)
MCHC: 31.6 g/dL (ref 30.0–36.0)
MCV: 87.5 fL (ref 80.0–100.0)
Monocytes Absolute: 1.1 10*3/uL — ABNORMAL HIGH (ref 0.1–1.0)
Monocytes Relative: 9 %
Neutro Abs: 10.2 10*3/uL — ABNORMAL HIGH (ref 1.7–7.7)
Neutrophils Relative %: 81 %
Platelets: 260 10*3/uL (ref 150–400)
RBC: 5.13 MIL/uL (ref 4.22–5.81)
RDW: 14.6 % (ref 11.5–15.5)
WBC: 12.6 10*3/uL — ABNORMAL HIGH (ref 4.0–10.5)
nRBC: 0 % (ref 0.0–0.2)

## 2021-05-05 LAB — COMPREHENSIVE METABOLIC PANEL
ALT: 21 U/L (ref 0–44)
AST: 25 U/L (ref 15–41)
Albumin: 3.3 g/dL — ABNORMAL LOW (ref 3.5–5.0)
Alkaline Phosphatase: 93 U/L (ref 38–126)
Anion gap: 10 (ref 5–15)
BUN: 23 mg/dL — ABNORMAL HIGH (ref 6–20)
CO2: 24 mmol/L (ref 22–32)
Calcium: 9.2 mg/dL (ref 8.9–10.3)
Chloride: 100 mmol/L (ref 98–111)
Creatinine, Ser: 1.66 mg/dL — ABNORMAL HIGH (ref 0.61–1.24)
GFR, Estimated: 47 mL/min — ABNORMAL LOW (ref >60)
Glucose, Bld: 116 mg/dL — ABNORMAL HIGH (ref 70–99)
Potassium: 4.8 mmol/L (ref 3.5–5.1)
Sodium: 134 mmol/L — ABNORMAL LOW (ref 135–145)
Total Bilirubin: 2.1 mg/dL — ABNORMAL HIGH (ref 0.3–1.2)
Total Protein: 7.5 g/dL (ref 6.5–8.1)

## 2021-05-05 LAB — URINALYSIS, ROUTINE W REFLEX MICROSCOPIC
Bacteria, UA: NONE SEEN
Bilirubin Urine: NEGATIVE
Glucose, UA: >=500 mg/dL — AB
Hgb urine dipstick: NEGATIVE
Ketones, ur: NEGATIVE mg/dL
Leukocytes,Ua: NEGATIVE
Nitrite: NEGATIVE
Protein, ur: 100 mg/dL — AB
Specific Gravity, Urine: 1.017 (ref 1.005–1.030)
pH: 5 (ref 5.0–8.0)

## 2021-05-05 LAB — C-REACTIVE PROTEIN: CRP: 17.8 mg/dL — ABNORMAL HIGH (ref <1.0)

## 2021-05-05 LAB — TROPONIN I (HIGH SENSITIVITY)
Troponin I (High Sensitivity): 79 ng/L — ABNORMAL HIGH (ref <18)
Troponin I (High Sensitivity): 84 ng/L — ABNORMAL HIGH (ref <18)

## 2021-05-05 LAB — BRAIN NATRIURETIC PEPTIDE: B Natriuretic Peptide: 1072.3 pg/mL — ABNORMAL HIGH (ref 0.0–100.0)

## 2021-05-05 LAB — LACTIC ACID, PLASMA: Lactic Acid, Venous: 1.4 mmol/L (ref 0.5–1.9)

## 2021-05-05 LAB — SEDIMENTATION RATE: Sed Rate: 53 mm/hr — ABNORMAL HIGH (ref 0–16)

## 2021-05-05 LAB — RESP PANEL BY RT-PCR (FLU A&B, COVID) ARPGX2
Influenza A by PCR: NEGATIVE
Influenza B by PCR: NEGATIVE
SARS Coronavirus 2 by RT PCR: NEGATIVE

## 2021-05-05 LAB — PROTIME-INR
INR: 2.5 — ABNORMAL HIGH (ref 0.8–1.2)
Prothrombin Time: 27 seconds — ABNORMAL HIGH (ref 11.4–15.2)

## 2021-05-05 MED ORDER — HYDROCODONE-ACETAMINOPHEN 5-325 MG PO TABS
2.0000 | ORAL_TABLET | Freq: Once | ORAL | Status: AC
  Administered 2021-05-05: 2 via ORAL
  Filled 2021-05-05: qty 2

## 2021-05-05 NOTE — Telephone Encounter (Signed)
Pt called and said that his feet are swelled up like basball bats and that he had went to [facility] and that they did kind of look at him but didn't give him any results and that they came into the room he was in and said he wouldn't be admitted and that if he didn't get up they would call security on him, He said he was just trying to figure out what the results were. Pt said they told him to get something over the counter but he felt like he needed an antibiotic or something. He requested call back from nurse. Please advise

## 2021-05-05 NOTE — Telephone Encounter (Signed)
Please advise to start gabapentin as prescribed by cardiologist. Need to schedule in person appt with me.

## 2021-05-05 NOTE — Telephone Encounter (Signed)
Called patient to give instructions and he states the ambulance was currently in his house because he wanted someone to take him to the hospital before his feet burst open. Pt states he did not have times to wait for a appointment. Instructed patient to contact our office after he has returned home for a hospital f/u. Pt verbalized understanding.

## 2021-05-05 NOTE — ED Provider Notes (Signed)
[facility] EMERGENCY DEPARTMENT Provider Note   CSN: [id number] Arrival date & time: 05/05/21  1554     History No chief complaint on file.   [name] is a 60 y.o. male.  HPI 60 year old male presents with bilateral feet pain.  The patient has been having some issues with his feet and swelling for a while but more prominently over the last week or so.  He is also been having on cough for about 3 weeks.  The feet seem to be worsening and are diffusely painful and swollen.  He was seen here about 3 days ago.  At that time he had a fever and no obvious source was found.  He states that since then his symptoms seem to be worse.  No headache, neck pain, chest pain, abdominal pain.  He does have some dysuria and dark urine. Has felt a subjective fever. No tick bites.   Past Medical History:  Diagnosis Date  . Acute gastric ulcer with hemorrhage   . Acute respiratory failure ([place]) 05/11/2019  . Aortic valve disease    a. 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 Atrial flutter ([place])   . Benign hypertensive heart and renal disease   . Cardiogenic shock ([place])   . Chronic combined systolic and diastolic CHF (congestive heart failure) ([place])   . Chronic kidney disease (CKD), stage III (moderate) (H[facility])   . COVID-19   . Diabetes mellitus ([place])   . Essential hypertension 11/19/2014  . Gout   . Hx of colonic polyps 11/11/2019  . Hyperlipidemia   . Insomnia   . Iron deficiency anemia   . Murmur   . Noncompliance   . Obesity   . Pseudomonas pneumonia ([place]) 05/16/2019  . Pulmonary hypertension [facility])     Patient Active Problem List   Diagnosis Date Noted  . Abdominal distention 04/06/2020  . Generalized abdominal pain 04/06/2020  . Change in bowel habits 04/06/2020  . Hx of colonic polyps 11/11/2019  . Anemia, iron deficiency 11/05/2019  . LV (left ventricular) mural thrombus without MI ([place])  06/19/2019  . Internal and external bleeding hemorrhoids   . S/P AVR 04/05/2015  . Aortic stenosis 03/29/2015  . Bicuspid aortic valve 03/29/2015  . Acute systolic CHF (congestive heart failure) ([place]) 03/27/2015  . Aortic insufficiency 11/19/2014  . Congestive dilated cardiomyopathy ([place]) 11/19/2014  . Essential hypertension 11/19/2014  . 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 Chronic systolic CHF (congestive heart failure) ([place]) 11/19/2014  . SOB (shortness of breath) 11/19/2014  . Atrial flutter ([place]) 11/19/2014    Past Surgical History:  Procedure Laterality Date  . AORTIC VALVE REPLACEMENT N/A 04/05/2015   Procedure: AORTIC VALVE REPLACEMENT (AVR) using a 35mm Edwards Aortic Magna Ease Valve ;  Surgeon: [name], MD;  Location: [facility];  Service: Open Heart Surgery;  Laterality: N/A;  . COLONOSCOPY WITH PROPOFOL N/A 11/05/2019   Procedure: COLONOSCOPY WITH PROPOFOL;  Surgeon: [name], MD;  Location: [facility] ENDOSCOPY;  Service: Endoscopy;  Laterality: N/A;  . ESOPHAGOGASTRODUODENOSCOPY (EGD) WITH PROPOFOL N/A 05/19/2019   Procedure: ESOPHAGOGASTRODUODENOSCOPY (EGD) WITH PROPOFOL;  Surgeon: [name], MD;  Location: [facility];  Service: Gastroenterology;  Laterality: N/A;  . HEMOSTASIS CLIP PLACEMENT  05/19/2019   Procedure: HEMOSTASIS CLIP PLACEMENT;  Surgeon: [name], MD;  Location: [facility] ENDOSCOPY;  Service: Gastroenterology;;  . LEFT AND RIGHT HEART CATHETERIZATION WITH CORONARY ANGIOGRAM N/A  11/27/2014   Procedure: LEFT AND RIGHT HEART CATHETERIZATION WITH CORONARY ANGIOGRAM;  Surgeon: [name], MD;  Location: [facility] CATH LAB;  Service: Cardiovascular;  Laterality: N/A;  . MAZE N/A 04/05/2015   Procedure: MAZE;  Surgeon: [name], MD;  Location: [facility];  Service: Open Heart Surgery;  Laterality: N/A;  . POLYPECTOMY  11/05/2019   Procedure: POLYPECTOMY;  Surgeon: [name], MD;  Location: [facility] ENDOSCOPY;  Service: Endoscopy;;  .  REPLACEMENT ASCENDING AORTA N/A 04/05/2015   Procedure: REPLACEMENT ASCENDING AORTA with a 11mm Hemashield Platinum Graft;  Surgeon: [name], MD;  Location: [facility];  Service: Open Heart Surgery;  Laterality: N/A;  . TEE WITHOUT CARDIOVERSION N/A 04/05/2015   Procedure: TRANSESOPHAGEAL ECHOCARDIOGRAM (TEE);  Surgeon: [name], MD;  Location: [facility];  Service: Open Heart Surgery;  Laterality: N/A;       Family History  Problem Relation Age of Onset  . Other Mother        MVA  . Liver disease Cousin        cirrhosis; alcohol related  . Heart attack Neg Hx   . Colon cancer Neg Hx   . Esophageal cancer Neg Hx   . Stomach cancer Neg Hx   . Pancreatic cancer Neg Hx     Social History   Tobacco Use  . Smoking status: Never Smoker  . Smokeless tobacco: Never Used  Vaping Use  . Vaping Use: Never used  Substance Use Topics  . Alcohol use: Yes    Comment: 5 monthly  . Drug use: Never    Home Medications Prior to Admission medications   Medication Sig Start Date End Date Taking? Authorizing Provider  amoxicillin (AMOXIL) 500 MG tablet Take 4 tablets (2,000 mg total) by mouth as directed. 1 hour prior to dental work Patient not taking: Reported on 04/27/2021 02/11/21   Bensimhon, Shaune Pascal, MD  empagliflozin (JARDIANCE) 10 MG TABS tablet Take 1 tablet (10 mg total) by mouth daily before breakfast. 04/05/21   Bensimhon, Shaune Pascal, MD  furosemide (LASIX) 40 MG tablet Take 1 tablet (40 mg total) by mouth daily. 01/13/21   Clegg, Amy D, NP  gabapentin (NEURONTIN) 100 MG capsule Take 1 capsule (100 mg total) by mouth 3 (three) times daily. 05/03/21   Larene Pickett, PA-C  hydrALAZINE (APRESOLINE) 25 MG tablet Take 1 tablet (25 mg total) by mouth 3 (three) times daily. 01/10/21 04/10/21  Bensimhon, Shaune Pascal, MD  isosorbide mononitrate (IMDUR) 60 MG 24 hr tablet Take 1 tablet (60 mg total) by mouth daily. 01/10/21   Bensimhon, Shaune Pascal, MD  sacubitril-valsartan (ENTRESTO) 97-103 MG Take 1  tablet by mouth 2 (two) times daily. 05/25/20   Bensimhon, Shaune Pascal, MD  spironolactone (ALDACTONE) 25 MG tablet TAKE ONE TABLET BY MOUTH DAILY Patient taking differently: Take 25 mg by mouth daily. 02/01/21   Bensimhon, Shaune Pascal, MD  warfarin (COUMADIN) 5 MG tablet Take 1 and 1/2 tablets by mouth daily except 2 tablets on Monday, Wednesday, and Friday or as directed by Anticoagulation Clinic. 03/03/21   Sueanne Margarita, MD    Allergies    Patient has no known allergies.  Review of Systems   Review of Systems  Constitutional: Positive for fever.  Respiratory: Positive for cough.   Cardiovascular: Positive for leg swelling. Negative for chest pain.  Gastrointestinal: Negative for abdominal pain.  Musculoskeletal: Positive for arthralgias and joint swelling. Negative for neck pain.  Neurological: Negative for headaches.  All other systems reviewed and are negative.   Physical Exam Updated Vital Signs BP 100/64   Pulse 68   Temp (!) 100.8 F (38.2 C) (Oral)   Resp 16   SpO2 96%   Physical Exam Vitals and nursing note reviewed.  Constitutional:      General: He is not in acute distress.    Appearance: He is well-developed. He is obese. He is not ill-appearing or diaphoretic.  HENT:     Head: Normocephalic and atraumatic.     Right Ear: External ear normal.     Left Ear: External ear normal.     Nose: Nose normal.  Eyes:     General:        Right eye: No discharge.        Left eye: No discharge.  Cardiovascular:     Rate and Rhythm: Normal rate and regular rhythm.     Pulses:          Dorsalis pedis pulses are 2+ on the right side and 2+ on the left side.     Heart sounds: Murmur heard.    Pulmonary:     Effort: Pulmonary effort is normal.     Breath sounds: Normal breath sounds. No wheezing.  Abdominal:     Palpations: Abdomen is soft.     Tenderness: There is no abdominal tenderness.  Musculoskeletal:     Cervical back: Neck supple.     Comments: Bilateral edema to  ankles/feet. No erythema. Has pain with movement and palpation of both feet and ankles.  Skin:    General: Skin is warm and dry.  Neurological:     Mental Status: He is alert.  Psychiatric:        Mood and Affect: Mood is not anxious.     ED Results / Procedures / Treatments   Labs (all labs ordered are listed, but only abnormal results are displayed) Labs Reviewed  CBC WITH DIFFERENTIAL/PLATELET - Abnormal; Notable for the following components:      Result Value   WBC 12.6 (*)    Neutro Abs 10.2 (*)    Monocytes Absolute 1.1 (*)    All other components within normal limits  COMPREHENSIVE METABOLIC PANEL - Abnormal; Notable for the following components:   Sodium 134 (*)    Glucose, Bld 116 (*)    BUN 23 (*)    Creatinine, Ser 1.66 (*)    Albumin 3.3 (*)    Total Bilirubin 2.1 (*)    GFR, Estimated 47 (*)    All other components within normal limits  BRAIN NATRIURETIC PEPTIDE - Abnormal; Notable for the following components:   B Natriuretic Peptide 1,072.3 (*)    All other components within normal limits  PROTIME-INR - Abnormal; Notable for the following components:   Prothrombin Time 27.0 (*)    INR 2.5 (*)    All other components within normal limits  URINALYSIS, ROUTINE W REFLEX MICROSCOPIC - Abnormal; Notable for the following components:   Glucose, UA >=500 (*)    Protein, ur 100 (*)    All other components within normal limits  C-REACTIVE PROTEIN - Abnormal; Notable for the following components:   CRP 17.8 (*)    All other components within normal limits  TROPONIN I (HIGH SENSITIVITY) - Abnormal; Notable for the following components:   Troponin I (High Sensitivity) 79 (*)    All other components within normal limits  TROPONIN I (HIGH SENSITIVITY) - Abnormal; Notable for the following components:  Troponin I (High Sensitivity) 84 (*)    All other components within normal limits  RESP PANEL BY RT-PCR (FLU A&B, COVID) [id number]  CULTURE, BLOOD (ROUTINE X 2)   CULTURE, BLOOD (ROUTINE X 2)  URINE CULTURE  LACTIC ACID, PLASMA  LACTIC ACID, PLASMA  SEDIMENTATION RATE    EKG EKG Interpretation  Date/Time:  Thursday May 05 2021 21:13:18 EDT Ventricular Rate:  67 PR Interval:    QRS Duration: 174 QT Interval:  467 QTC Calculation: 493 R Axis:   -83 Text Interpretation: Atrial fibrillation Ventricular premature complex Nonspecific IVCD with LAD Borderline abnrm T, anterolateral leads Confirmed by Sherwood Gambler [phone]) on 05/05/2021 9:19:25 PM   Radiology DG Chest 1 View  Result Date: 05/05/2021 CLINICAL DATA:  Chest pain. EXAM: CHEST  1 VIEW COMPARISON:  May 02, 2021. FINDINGS: Stable cardiomegaly. Sternotomy wires are noted. No pneumothorax or pleural effusion is noted. Both lungs are clear. The visualized skeletal structures are unremarkable. IMPRESSION: No active disease. Electronically Signed   By: Marijo Conception M.D.   On: 05/05/2021 16:22   DG Ankle Complete Left  Result Date: 05/05/2021 CLINICAL DATA:  Ankle pain and fevers, initial encounter EXAM: LEFT ANKLE COMPLETE - 3+ VIEW COMPARISON:  None. FINDINGS: There is no evidence of fracture, dislocation, or joint effusion. Mild tarsal degenerative changes are noted. No acute soft tissue abnormality is seen. IMPRESSION: No acute abnormality noted. Electronically Signed   By: Inez Catalina M.D.   On: 05/05/2021 22:03   DG Ankle Complete Right  Result Date: 05/05/2021 CLINICAL DATA:  Ankle pain and fevers, initial encounter EXAM: RIGHT ANKLE - COMPLETE 3+ VIEW COMPARISON:  None. FINDINGS: Calcaneal spurring is noted. No significant soft tissue abnormality is noted. Tiny bony density is noted adjacent to the distal fibula likely related to prior trauma and nonunion. No new focal abnormality is seen. IMPRESSION: Chronic changes without acute abnormality. Electronically Signed   By: Inez Catalina M.D.   On: 05/05/2021 22:02   CT Chest Wo Contrast  Result Date: 05/05/2021 CLINICAL DATA:   Persistent cough EXAM: CT CHEST WITHOUT CONTRAST TECHNIQUE: Multidetector CT imaging of the chest was performed following the standard protocol without IV contrast. COMPARISON:  Chest x-ray from earlier in the same day, CT from 04/16/2020. FINDINGS: Cardiovascular: Somewhat limited due to lack of IV contrast. Aortic calcifications are noted without aneurysmal dilatation. Heart is enlarged in size similar to that seen on prior chest x-ray. Aortic valve calcifications are noted. Mediastinum/Nodes: Thoracic inlet demonstrates some scattered calcifications within the thyroid although no discrete nodule is seen. No sizable hilar or mediastinal adenopathy is noted. The esophagus is within normal limits. Lungs/Pleura: Mild scarring is noted in the bases bilaterally. No sizable effusion is seen. No focal infiltrate or sizable parenchymal nodule is noted. Upper Abdomen: Visualized upper abdomen shows no acute abnormality. Musculoskeletal: Degenerative changes of the thoracic spine are noted. IMPRESSION: Mild scarring in the bases bilaterally. Stable cardiomegaly. No acute abnormality is noted. Aortic Atherosclerosis (ICD10-I70.0). Electronically Signed   By: Inez Catalina M.D.   On: 05/05/2021 21:42   DG Foot Complete Left  Result Date: 05/05/2021 CLINICAL DATA:  Foot pain and fevers, initial encounter EXAM: LEFT FOOT - COMPLETE 3+ VIEW COMPARISON:  None. FINDINGS: Mild tarsal degenerative changes are noted. No acute fracture or dislocation is noted. No soft tissue abnormality is seen. IMPRESSION: Negative. Electronically Signed   By: Inez Catalina M.D.   On: 05/05/2021 22:03   DG Foot Complete Right  Result  Date: 05/05/2021 CLINICAL DATA:  Foot pain and fevers, initial encounter EXAM: RIGHT FOOT COMPLETE - 3+ VIEW COMPARISON:  None. FINDINGS: Calcaneal spurring is noted. Mild tarsal degenerative changes are seen. No acute fracture or dislocation is noted. No soft tissue abnormality is seen. IMPRESSION: No acute  abnormality noted. Electronically Signed   By: Inez Catalina M.D.   On: 05/05/2021 22:04    Procedures Procedures   Medications Ordered in ED Medications  HYDROcodone-acetaminophen (NORCO/VICODIN) 5-325 MG per tablet 2 tablet (2 tablets Oral Given 05/05/21 2155)    ED Course  I have reviewed the triage vital signs and the nursing notes.  Pertinent labs & imaging results that were available during my care of the patient were reviewed by me and considered in my medical decision making (see chart for details).    MDM Rules/Calculators/A&P                          Unclear cause of why the patient is having a fever.  He had a fever a couple nights ago as well with essentially negative work-up besides mild leukocytosis.  Negative blood cultures.  Repeat blood cultures obtained.  Labs are pretty benign except his CRP is pretty elevated at 17.8.  Troponins are high but this is probably chronic from CHF.  No clear bacterial source of infection or other source.  No cellulitis.  No obvious joint effusions to suggest septic joints.  No tick bites or back pain.  However he is chronically ill with significant comorbidities and I think with his aortic valve replacement he will need at least an echo to rule out endocarditis.  Discussed with hospitalist for admission. Final Clinical Impression(s) / ED Diagnoses Final diagnoses:  Fever of unknown origin    Rx / DC Orders ED Discharge Orders    None       Sherwood Gambler, MD 05/05/21 2341

## 2021-05-05 NOTE — ED Triage Notes (Signed)
Pt bib ems from home with reports of increased BLE edema since being seen 3 days ago. Pt now unable to stand for long due to the swelling. Pt with hx of heart transplant. Pt also with dark brown urine. Endorses burning with urination. Pt endorses compliance with his medications.  128/90 HR 68 96% RA

## 2021-05-05 NOTE — ED Provider Notes (Signed)
Emergency Medicine Provider Triage Evaluation Note  [name] , a 60 y.o. male  was evaluated in triage.  Pt complains of swelling in his bilateral lower extremities, started 2 weeks ago, today cannot stand on his feet due to the pain in his feet.  No warmth, fevers, chills.  Urine is very dark per EMS.  Was seen here 2 days ago with similar complaints, BNP appears to be at baseline, he was sent home with gabapentin.  He has a history of heart transplant.  He does have a burning sensation when he urinates, no  urinary retention.  Reportedly has been compliant with his medications  Review of Systems  Positive: As above Negative: As above  Physical Exam  There were no vitals taken for this visit. Gen:   Awake, no distress   Resp:  Normal effort  MSK:   Moves extremities without difficulty  Other:  Trace edema in bilateral lower extremities,  chronically ill-appearing, speaking in full sentences without increased work of breathing  Medical Decision Making  Medically screening exam initiated at 3:44 PM.  Appropriate orders placed.  [name] was informed that the remainder of the evaluation will be completed by another provider, this initial triage assessment does not replace that evaluation, and the importance of remaining in the ED until their evaluation is complete.     Jonathon Balding, PA-C 05/05/21 [place], DO 05/05/21 1623

## 2021-05-05 NOTE — H&P (Addendum)
History and Physical        Hospital Admission Note Date: 05/06/2021  Patient name: [name] Medical record number: [id number] Date of birth: 23-Jul-1961 Age: 60 y.o. Gender: male  PCP: Nche, Charlene Brooke, NP    Patient coming from: Home   I have reviewed all records in the [facility].    Chief Complaint:  Feet Pain Bilaterally, Cough   HPI: [name] is a 60 y.o. 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 Reports foot pain has become increasingly worse, making ambulation difficult. Denies radiation into legs. Denies ulcerations of feet. Also endorsing productive cough x3 weeks.    ED work-up/course:   Review of Systems: Positives marked in 'bold' Constitutional: Denies fever, chills, diaphoresis, poor appetite and fatigue.  HEENT: Denies photophobia, eye pain, redness, hearing loss, ear pain, congestion, sore throat, rhinorrhea, sneezing, mouth sores, trouble swallowing, neck pain, neck stiffness and tinnitus.   Respiratory: Denies SOB, DOE, cough, chest tightness,  and wheezing.   Cardiovascular: Denies chest pain, palpitations and leg swelling.  Gastrointestinal: Denies nausea, vomiting, abdominal pain, diarrhea, constipation, blood in stool and abdominal distention.  Genitourinary: Denies dysuria, urgency, frequency, hematuria, flank pain and difficulty urinating.  Musculoskeletal: Denies myalgias, back pain, joint swelling, arthralgias and gait problem.  Skin: Denies pallor, rash and wound.  Neurological: Denies dizziness, seizures, syncope, weakness, light-headedness, numbness and headaches.  Hematological: Denies adenopathy. Easy bruising, personal or family bleeding history  Psychiatric/Behavioral: Denies suicidal ideation, mood changes, confusion, nervousness, sleep disturbance and agitation  Past Medical  History: Past Medical History:  Diagnosis Date  . Acute gastric ulcer with hemorrhage   . Acute respiratory failure ([place]) 05/11/2019  . Aortic valve disease    a. 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 Atrial flutter ([place])   . Benign hypertensive heart and renal disease   . Cardiogenic shock ([place])   . Chronic combined systolic and diastolic CHF (congestive heart failure) ([place])   . Chronic kidney disease (CKD), stage III (moderate) (HCC)   . COVID-19   . Diabetes mellitus ([place])   . Essential hypertension 11/19/2014  . Gout   . Hx of colonic polyps 11/11/2019  . Hyperlipidemia   . Insomnia   . Iron deficiency anemia   . Murmur   . Noncompliance   . Obesity   . Pseudomonas pneumonia ([place]) 05/16/2019  . Pulmonary hypertension ([place])     Past Surgical History:  Procedure Laterality Date  . AORTIC VALVE REPLACEMENT N/A 04/05/2015   Procedure: AORTIC VALVE REPLACEMENT (AVR) using a 84m Edwards Aortic Magna Ease Valve ;  Surgeon: PIvin Poot MD;  [facility] [facility]  Service: Open Heart Surgery;  Laterality: N/A;  . COLONOSCOPY WITH PROPOFOL N/A 11/05/2019   Procedure: COLONOSCOPY WITH PROPOFOL;  Surgeon: GGatha Mayer MD;  Location: [facility] ENDOSCOPY;  Service: Endoscopy;  Laterality: N/A;  . ESOPHAGOGASTRODUODENOSCOPY (EGD) WITH PROPOFOL N/A 05/19/2019   Procedure: ESOPHAGOGASTRODUODENOSCOPY (EGD) WITH PROPOFOL;  Surgeon: GGatha Mayer MD;  Location: [facility]  Service: Gastroenterology;  Laterality: N/A;  . HEMOSTASIS CLIP PLACEMENT  05/19/2019   Procedure: HEMOSTASIS CLIP PLACEMENT;  Surgeon: [name], MD;  [facility] END[facility]COPY;  Service: Gastroenterology;;  . LEFT AND RIGHT HEART CATHETERIZATION WITH CORONARY ANGIOGRAM N/A 11/27/2014   Procedure: LEFT AND RIGHT HEART CATHETERIZATION WITH CORONARY ANGIOGRAM;  Surgeon: [name], MD;  Location: [facility] CATH LAB;  Service: Cardiovascular;  Laterality: N/A;   . MAZE N/A 04/05/2015   Procedure: MAZE;  Surgeon: [name], MD;  Location: [place];  Service: Open Heart Surgery;  Laterality: N/A;  . POLYPECTOMY  11/05/2019   Procedure: POLYPECTOMY;  Surgeon: [name], MD;  Location: [facility] ENDOS[facility];  Service: Endoscopy;;  . REPLACEMENT ASCENDING AORTA N/A 04/05/2015   Procedure: REPLACEMENT ASCENDING AORTA with a 50mm Hemashield Platinum Graft;  Surgeon: [name], MD;  Location: [place];  Service: Open Heart Surgery;  Laterality: N/A;  . TEE WITHOUT CARDIOVERSION N/A 04/05/2015   Procedure: TRANSESOPHAGEAL ECHOCARDIOGRAM (TEE);  Surgeon: [name], MD;  Location: [place];  Service: Open Heart Surgery;  Laterality: N/A;    Medications: Prior to Admission medications   Medication Sig Start Date End Date Taking? Authorizing Provider  amoxicillin (AMOXIL) 500 MG tablet Take 4 tablets (2,000 mg total) by mouth as directed. 1 hour prior to dental work Patient not taking: Reported on 04/27/2021 02/11/21   Bensimhon, Shaune Pascal, MD  empagliflozin (JARDIANCE) 10 MG TABS tablet Take 1 tablet (10 mg total) by mouth daily before breakfast. 04/05/21   Bensimhon, Shaune Pascal, MD  furosemide (LASIX) 40 MG tablet Take 1 tablet (40 mg total) by mouth daily. 01/13/21   Clegg, Amy D, NP  gabapentin (NEURONTIN) 100 MG capsule Take 1 capsule (100 mg total) by mouth 3 (three) times daily. 05/03/21   Larene Pickett, PA-C  hydrALAZINE (APRESOLINE) 25 MG tablet Take 1 tablet (25 mg total) by mouth 3 (three) times daily. 01/10/21 04/10/21  Bensimhon, Shaune Pascal, MD  isosorbide mononitrate (IMDUR) 60 MG 24 hr tablet Take 1 tablet (60 mg total) by mouth daily. 01/10/21   Bensimhon, Shaune Pascal, MD  sacubitril-valsartan (ENTRESTO) 97-103 MG Take 1 tablet by mouth 2 (two) times daily. 05/25/20   Bensimhon, Shaune Pascal, MD  spironolactone (ALDACTONE) 25 MG tablet TAKE ONE TABLET BY MOUTH DAILY Patient taking differently: Take 25 mg by mouth daily. 02/01/21   Bensimhon, Shaune Pascal, MD  warfarin  (COUMADIN) 5 MG tablet Take 1 and 1/2 tablets by mouth daily except 2 tablets on Monday, Wednesday, and Friday or as directed by Anticoagulation Clinic. 03/03/21   Sueanne Margarita, MD    Allergies:  No Known Allergies  Social History:  reports that he has never smoked. He has never used smokeless tobacco. He reports current alcohol use. He reports that he does not use drugs.  Family History: Family History  Problem Relation Age of Onset  . Other Mother        MVA  . Liver disease Cousin        cirrhosis; alcohol related  . Heart attack Neg Hx   . Colon cancer Neg Hx   . Esophageal cancer Neg Hx   . Stomach cancer Neg Hx   . Pancreatic cancer Neg Hx     Physical Exam: Blood pressure 99/66, pulse 66, temperature (!) 100.8 F (38.2 C), temperature source Oral, resp. rate 18, SpO2 97 %. General: Alert, awake, oriented x3, in no acute distress. Eyes: pink conjunctiva,anicteric sclera, pupils equal and reactive to  light and accomodation, HEENT: normocephalic, atraumatic, oropharynx clear Neck: supple, no masses or lymphadenopathy, no goiter, no bruits, no JVD CVS: Regular rate and rhythm, murmur appreciated, rubs or gallops. Bilateral edema to ankles/feet. 2+ pedal pulses. Neg Homan's sign bilaterally. No palpable cords or calf tenderness.  Resp : Clear to auscultation bilaterally, no wheezing, rales or rhonchi. GI : Soft, nontender, nondistended, positive bowel sounds, no masses. No hepatomegaly. No hernia.  Musculoskeletal: TTP of feet bilaterally  Neuro: Grossly intact, no focal neurological deficits, strength 5/5 upper and lower extremities bilaterally Psych: alert and oriented x 3, normal mood and affect Skin: no rashes or lesions, warm and dry   LABS on Admission: I have personally reviewed all the labs and imagings below    Basic Metabolic Panel: Recent Labs  Lab 05/02/21 2033 05/05/21 1600  NA 135 134*  K 4.2 4.8  CL 103 100  CO2 24 24  GLUCOSE 126* 116*  BUN 19  23*  CREATININE 1.70* 1.66*  CALCIUM 9.3 9.2   Liver Function Tests: Recent Labs  Lab 05/05/21 1600  AST 25  ALT 21  ALKPHOS 93  BILITOT 2.1*  PROT 7.5  ALBUMIN 3.3*   No results for input(s): LIPASE, AMYLASE in the last 168 hours. No results for input(s): AMMONIA in the last 168 hours. CBC: Recent Labs  Lab 05/02/21 2033 05/05/21 1600  WBC 11.3* 12.6*  NEUTROABS  --  10.2*  HGB 15.1 14.2  HCT 47.0 44.9  MCV 86.1 87.5  PLT 249 260   Cardiac Enzymes: Recent Labs  Lab 05/02/21 2033  CKTOTAL 206   BNP: Invalid input(s): POCBNP CBG: No results for input(s): GLUCAP in the last 168 hours.  Radiological Exams on Admission:  DG Chest 1 View  Result Date: 05/05/2021 CLINICAL DATA:  Chest pain. EXAM: CHEST  1 VIEW COMPARISON:  May 02, 2021. FINDINGS: Stable cardiomegaly. Sternotomy wires are noted. No pneumothorax or pleural effusion is noted. Both lungs are clear. The visualized skeletal structures are unremarkable. IMPRESSION: No active disease. Electronically Signed   By: Marijo Conception M.D.   On: 05/05/2021 16:22   DG Ankle Complete Left  Result Date: 05/05/2021 CLINICAL DATA:  Ankle pain and fevers, initial encounter EXAM: LEFT ANKLE COMPLETE - 3+ VIEW COMPARISON:  None. FINDINGS: There is no evidence of fracture, dislocation, or joint effusion. Mild tarsal degenerative changes are noted. No acute soft tissue abnormality is seen. IMPRESSION: No acute abnormality noted. Electronically Signed   By: Inez Catalina M.D.   On: 05/05/2021 22:03   DG Ankle Complete Right  Result Date: 05/05/2021 CLINICAL DATA:  Ankle pain and fevers, initial encounter EXAM: RIGHT ANKLE - COMPLETE 3+ VIEW COMPARISON:  None. FINDINGS: Calcaneal spurring is noted. No significant soft tissue abnormality is noted. Tiny bony density is noted adjacent to the distal fibula likely related to prior trauma and nonunion. No new focal abnormality is seen. IMPRESSION: Chronic changes without acute  abnormality. Electronically Signed   By: Inez Catalina M.D.   On: 05/05/2021 22:02   CT Chest Wo Contrast  Result Date: 05/05/2021 CLINICAL DATA:  Persistent cough EXAM: CT CHEST WITHOUT CONTRAST TECHNIQUE: Multidetector CT imaging of the chest was performed following the standard protocol without IV contrast. COMPARISON:  Chest x-ray from earlier in the same day, CT from 04/16/2020. FINDINGS: Cardiovascular: Somewhat limited due to lack of IV contrast. Aortic calcifications are noted without aneurysmal dilatation. Heart is enlarged in size similar to that seen on prior chest x-ray. Aortic  valve calcifications are noted. Mediastinum/Nodes: Thoracic inlet demonstrates some scattered calcifications within the thyroid although no discrete nodule is seen. No sizable hilar or mediastinal adenopathy is noted. The esophagus is within normal limits. Lungs/Pleura: Mild scarring is noted in the bases bilaterally. No sizable effusion is seen. No focal infiltrate or sizable parenchymal nodule is noted. Upper Abdomen: Visualized upper abdomen shows no acute abnormality. Musculoskeletal: Degenerative changes of the thoracic spine are noted. IMPRESSION: Mild scarring in the bases bilaterally. Stable cardiomegaly. No acute abnormality is noted. Aortic Atherosclerosis (ICD10-I70.0). Electronically Signed   By: Inez Catalina M.D.   On: 05/05/2021 21:42   DG Foot Complete Left  Result Date: 05/05/2021 CLINICAL DATA:  Foot pain and fevers, initial encounter EXAM: LEFT FOOT - COMPLETE 3+ VIEW COMPARISON:  None. FINDINGS: Mild tarsal degenerative changes are noted. No acute fracture or dislocation is noted. No soft tissue abnormality is seen. IMPRESSION: Negative. Electronically Signed   By: Inez Catalina M.D.   On: 05/05/2021 22:03   DG Foot Complete Right  Result Date: 05/05/2021 CLINICAL DATA:  Foot pain and fevers, initial encounter EXAM: RIGHT FOOT COMPLETE - 3+ VIEW COMPARISON:  None. FINDINGS: Calcaneal spurring is noted.  Mild tarsal degenerative changes are seen. No acute fracture or dislocation is noted. No soft tissue abnormality is seen. IMPRESSION: No acute abnormality noted. Electronically Signed   By: Inez Catalina M.D.   On: 05/05/2021 22:04     Assessment/Plan Active Problems:   Congestive dilated cardiomyopathy (HCC)   Essential hypertension   CKD (chronic kidney disease), stage II-III   S/P AVR   Fever  Fever  T 100.8 at presentation. T 101.5 during ED visit on 5/16. Blood cultures obtained 5/16 were negative. Unknown etiology of fever. Has no other abnormal vital signs, low suspicion for sepsis. Labs are remarkable for low grade leukocytosis with 12.6 and absolute neutrophils 10.2. Lactic acid was normal. Patient denies any open wounds and none appreciated on exam. Patient is endorsing foot pain but xrays of feet/ankles were negative. Of note, CRP 17.8 and ESR 53. Lower suspicion for osteomyelitis given lack of wounds, ulcers, or skin color changes/increased warmth as well as lack of findings on xray. Bilateral involvement would also be uncommon. Patient is endorsing cough; however CXR and CT chest unremarkable for acute lung disease. Respiratory swab neg. Potential that patient has other viral URI as etiology of fever. UA is only remarkable for glucose and protein, not concerning for infection.  -admit to Medsurg  -monitor fever curve  -blood cultures pending, consider repeat if no source noted  -urine culture pending, hold jardiance if determined to have UTI  -trend CBC  -hold off on antibiotics for now, low threshold to start broad spectrum if has repeat fever or worsens  -given history of AVR, will obtain echo to rule out endocarditis  -if intiial work up is unremarkable and fever persists, would need to begin more detailed FUO work up and consider ID involvement   Foot Pain Bilaterally. Exam unremarkable without sign of cellulitis, wounds, DVTs. Xray imaging was negative. 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 Recent glucose control not known without recent A1c. Noted that patient does have 500 glucose on urine. May also be related to gout as uric acid was 10.3 on labs 5/16 however does not have any exam findings consistent with gout. Will monitor and proceed with further work up/treatment as indicated.   HFrEF  EF 20-25% with global hypokinesis Feb 2022. Lung exam is clear and appears to not be significantly volume overloaded on exam. CXR without pulmonary edema. No respiratory distress and stable on RA. BNP is significantly elevated to 1000 but this appears consistent with his baseline. Mild troponin elevation, likely related to demand from chronic CHF.  -continue home Lasix, Entresto, Spironolactone   History of Aortic Stenosis with Aortic Valve Replacement  -continue Coumadin per pharmacy  -obtain echo given fever with history of prosthetic valve   HTN  BP stable at admission.  -continue home regimen    T2DM  Last A1c available for review 6.7% in 2020.  -obtain A1c  -continue Jardiance, hold if renal function worsens or urine culture + -CBGs, add SSI as needed   CKD Stage III Cr 1.66 at admission, GFD 47. Labs consistent with baseline.  -monitor BMET -avoid nephrotoxic agents    DVT prophylaxis: Home Coumadin   CODE STATUS: FULL   Consults called: None    Admission status: Inpatient   The medical decision making on this patient was of high complexity and the patient is at high risk for clinical deterioration, therefore this is a level 3 admission.  Severity of Illness:      The appropriate patient status for this patient is INPATIENT. Inpatient status is judged to be reasonable and necessary in order to provide the required intensity of service to ensure the patient's safety. The patient's presenting symptoms, physical exam findings, and initial radiographic and laboratory data in the context  of their chronic comorbidities is felt to place them at high risk for further clinical deterioration. Furthermore, it is not anticipated that the patient will be medically stable for discharge from the hospital within 2 midnights of admission. The following factors support the patient status of inpatient.   " The patient's presenting symptoms include foot pain, cough. " The worrisome physical exam findings include bilateral foot tenderness. " The initial radiographic and laboratory data are worrisome because of elevated CRP, elevated ESR, negative foot and ankle xrays. " The chronic co-morbidities include  HTN, CHF, T2DM, history of AVR on Coumadin.   * I certify that at the point of admission it is my clinical judgment that the patient will require inpatient hospital care spanning beyond 2 midnights from the point of admission due to high intensity of service, high risk for further deterioration and high frequency of surveillance required.*    Time Spent on Admission: 46 minutes      Melina Schools D.O.  [facility] 05/06/2021, 1:11 AM

## 2021-05-06 ENCOUNTER — Inpatient Hospital Stay (HOSPITAL_BASED_OUTPATIENT_CLINIC_OR_DEPARTMENT_OTHER): Payer: Medicare (Managed Care)

## 2021-05-06 ENCOUNTER — Other Ambulatory Visit: Payer: Self-pay

## 2021-05-06 ENCOUNTER — Inpatient Hospital Stay (HOSPITAL_COMMUNITY): Payer: Medicare (Managed Care)

## 2021-05-06 DIAGNOSIS — I10 Essential (primary) hypertension: Secondary | ICD-10-CM | POA: Diagnosis not present

## 2021-05-06 DIAGNOSIS — R609 Edema, unspecified: Secondary | ICD-10-CM

## 2021-05-06 DIAGNOSIS — R509 Fever, unspecified: Secondary | ICD-10-CM | POA: Diagnosis present

## 2021-05-06 DIAGNOSIS — I420 Dilated cardiomyopathy: Secondary | ICD-10-CM | POA: Diagnosis not present

## 2021-05-06 DIAGNOSIS — N182 Chronic kidney disease, stage 2 (mild): Secondary | ICD-10-CM | POA: Diagnosis not present

## 2021-05-06 LAB — CBC
HCT: 42.7 % (ref 39.0–52.0)
Hemoglobin: 14.1 g/dL (ref 13.0–17.0)
MCH: 28.6 pg (ref 26.0–34.0)
MCHC: 33 g/dL (ref 30.0–36.0)
MCV: 86.6 fL (ref 80.0–100.0)
Platelets: 285 10*3/uL (ref 150–400)
RBC: 4.93 MIL/uL (ref 4.22–5.81)
RDW: 14.6 % (ref 11.5–15.5)
WBC: 11.3 10*3/uL — ABNORMAL HIGH (ref 4.0–10.5)
nRBC: 0 % (ref 0.0–0.2)

## 2021-05-06 LAB — TROPONIN I (HIGH SENSITIVITY)
Troponin I (High Sensitivity): 55 ng/L — ABNORMAL HIGH (ref <18)
Troponin I (High Sensitivity): 62 ng/L — ABNORMAL HIGH (ref <18)

## 2021-05-06 LAB — COMPREHENSIVE METABOLIC PANEL
ALT: 20 U/L (ref 0–44)
AST: 23 U/L (ref 15–41)
Albumin: 3 g/dL — ABNORMAL LOW (ref 3.5–5.0)
Alkaline Phosphatase: 91 U/L (ref 38–126)
Anion gap: 10 (ref 5–15)
BUN: 28 mg/dL — ABNORMAL HIGH (ref 6–20)
CO2: 24 mmol/L (ref 22–32)
Calcium: 8.8 mg/dL — ABNORMAL LOW (ref 8.9–10.3)
Chloride: 99 mmol/L (ref 98–111)
Creatinine, Ser: 1.69 mg/dL — ABNORMAL HIGH (ref 0.61–1.24)
GFR, Estimated: 46 mL/min — ABNORMAL LOW (ref >60)
Glucose, Bld: 131 mg/dL — ABNORMAL HIGH (ref 70–99)
Potassium: 4.8 mmol/L (ref 3.5–5.1)
Sodium: 133 mmol/L — ABNORMAL LOW (ref 135–145)
Total Bilirubin: 1.4 mg/dL — ABNORMAL HIGH (ref 0.3–1.2)
Total Protein: 7 g/dL (ref 6.5–8.1)

## 2021-05-06 LAB — RAPID URINE DRUG SCREEN, HOSP PERFORMED
Amphetamines: NOT DETECTED
Barbiturates: NOT DETECTED
Benzodiazepines: NOT DETECTED
Cocaine: NOT DETECTED
Opiates: POSITIVE — AB
Tetrahydrocannabinol: NOT DETECTED

## 2021-05-06 LAB — CBG MONITORING, ED: Glucose-Capillary: 108 mg/dL — ABNORMAL HIGH (ref 70–99)

## 2021-05-06 LAB — PROTIME-INR
INR: 3 — ABNORMAL HIGH (ref 0.8–1.2)
Prothrombin Time: 31.1 seconds — ABNORMAL HIGH (ref 11.4–15.2)

## 2021-05-06 LAB — MRSA PCR SCREENING: MRSA by PCR: NEGATIVE

## 2021-05-06 LAB — HIV ANTIBODY (ROUTINE TESTING W REFLEX): HIV Screen 4th Generation wRfx: NONREACTIVE

## 2021-05-06 LAB — GLUCOSE, CAPILLARY
Glucose-Capillary: 123 mg/dL — ABNORMAL HIGH (ref 70–99)
Glucose-Capillary: 118 mg/dL — ABNORMAL HIGH (ref 70–99)

## 2021-05-06 LAB — HEMOGLOBIN A1C
Hgb A1c MFr Bld: 7.1 % — ABNORMAL HIGH (ref 4.8–5.6)
Mean Plasma Glucose: 157.07 mg/dL

## 2021-05-06 MED ORDER — HYDROCODONE-ACETAMINOPHEN 5-325 MG PO TABS
2.0000 | ORAL_TABLET | Freq: Once | ORAL | Status: AC
  Administered 2021-05-06: 2 via ORAL
  Filled 2021-05-06: qty 2

## 2021-05-06 MED ORDER — SPIRONOLACTONE 25 MG PO TABS
25.0000 mg | ORAL_TABLET | Freq: Every day | ORAL | Status: DC
  Administered 2021-05-07 – 2021-05-10 (×4): 25 mg via ORAL
  Filled 2021-05-06 (×5): qty 1

## 2021-05-06 MED ORDER — ACETAMINOPHEN 650 MG RE SUPP: 650.0000 mg | Freq: Four times a day (QID) | RECTAL | Status: DC | PRN

## 2021-05-06 MED ORDER — WARFARIN - PHARMACIST DOSING INPATIENT: Freq: Every day | Status: DC

## 2021-05-06 MED ORDER — ISOSORBIDE MONONITRATE ER 60 MG PO TB24
60.0000 mg | ORAL_TABLET | Freq: Every day | ORAL | Status: DC
  Administered 2021-05-07: 60 mg via ORAL
  Filled 2021-05-06: qty 1
  Filled 2021-05-06: qty 2

## 2021-05-06 MED ORDER — EMPAGLIFLOZIN 10 MG PO TABS
10.0000 mg | ORAL_TABLET | Freq: Every day | ORAL | Status: DC
  Administered 2021-05-07 – 2021-05-14 (×8): 10 mg via ORAL
  Filled 2021-05-06 (×10): qty 1

## 2021-05-06 MED ORDER — GABAPENTIN 100 MG PO CAPS
100.0000 mg | ORAL_CAPSULE | Freq: Three times a day (TID) | ORAL | Status: DC
  Filled 2021-05-06 (×4): qty 1

## 2021-05-06 MED ORDER — HYDRALAZINE HCL 25 MG PO TABS
25.0000 mg | ORAL_TABLET | Freq: Three times a day (TID) | ORAL | Status: DC
  Administered 2021-05-06 – 2021-05-07 (×2): 25 mg via ORAL
  Filled 2021-05-06 (×5): qty 1

## 2021-05-06 MED ORDER — FUROSEMIDE 40 MG PO TABS
40.0000 mg | ORAL_TABLET | Freq: Every day | ORAL | Status: DC
  Administered 2021-05-07 – 2021-05-14 (×8): 40 mg via ORAL
  Filled 2021-05-06 (×8): qty 1
  Filled 2021-05-06: qty 2

## 2021-05-06 MED ORDER — SACUBITRIL-VALSARTAN 97-103 MG PO TABS
1.0000 | ORAL_TABLET | Freq: Two times a day (BID) | ORAL | Status: DC
  Administered 2021-05-06 – 2021-05-11 (×12): 1 via ORAL
  Filled 2021-05-06 (×15): qty 1

## 2021-05-06 MED ORDER — WARFARIN SODIUM 10 MG PO TABS
10.0000 mg | ORAL_TABLET | Freq: Once | ORAL | Status: AC
  Administered 2021-05-06: 10 mg via ORAL
  Filled 2021-05-06 (×3): qty 1

## 2021-05-06 MED ORDER — ACETAMINOPHEN 325 MG PO TABS
650.0000 mg | ORAL_TABLET | Freq: Four times a day (QID) | ORAL | Status: DC | PRN
  Administered 2021-05-08: 650 mg via ORAL
  Filled 2021-05-06: qty 2

## 2021-05-06 NOTE — Care Management CC44 (Signed)
Condition Code 44 Documentation Completed  Patient Details  Name: [name] MRN: [id number] Date of Birth: Jul 01, 1961   Condition Code 44 given:  Yes Patient signature on Condition Code 44 notice:  Yes Documentation of 2 MD's agreement:  Yes Code 44 added to claim:  Yes    Fuller Mandril, RN 05/06/2021, 2:35 PM

## 2021-05-06 NOTE — ED Notes (Signed)
Patient called RN back to bedside after medications were scanned. Opened and placed in a medicine cup and told RN he was not going to take any medications dispenced from pharmacy, that he was going to take his medication from home which he had with him at bedside. Pharmacist was consulted to talk with patient regarding this process.

## 2021-05-06 NOTE — Progress Notes (Signed)
ANTICOAGULATION CONSULT NOTE - Initial Consult  Pharmacy Consult for Coumadin Indication: Atrial flutter, h/o LV thrombus  No Known Allergies  Patient Measurements:    Vital Signs: Temp: 100.8 F (38.2 C) (05/19 1556) Temp Source: Oral (05/19 1556) BP: 99/66 (05/20 0000) Pulse Rate: 66 (05/20 0000)  Labs: Recent Labs    05/03/21 0343 05/05/21 1600 05/05/21 1935 05/05/21 2023  HGB  --  14.2  --   --   HCT  --  44.9  --   --   PLT  --  260  --   --   LABPROT 20.8*  --   --  27.0*  INR 1.8*  --   --  2.5*  CREATININE  --  1.66*  --   --   TROPONINIHS  --  79* 84*  --     Estimated Creatinine Clearance: 62.6 mL/min (A) (by C-G formula based on SCr of 1.66 mg/dL (H)).   Medical History: Past Medical History:  Diagnosis Date  . Acute gastric ulcer with hemorrhage   . Acute respiratory failure ([place]) 05/11/2019  . Aortic valve disease    a. 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 Atrial flutter ([place])   . Benign hypertensive heart and renal disease   . Cardiogenic shock ([place])   . Chronic combined systolic and diastolic CHF (congestive heart failure) ([place])   . Chronic kidney disease (CKD), stage III (moderate) (HCC)   . COVID-19   . Diabetes mellitus ([place])   . Essential hypertension 11/19/2014  . Gout   . Hx of colonic polyps 11/11/2019  . Hyperlipidemia   . Insomnia   . Iron deficiency anemia   . Murmur   . Noncompliance   . Obesity   . Pseudomonas pneumonia ([place]) 05/16/2019  . Pulmonary hypertension (HCC)     Medications:  No current facility-administered medications on file prior to encounter.   Current Outpatient Medications on File Prior to Encounter  Medication Sig Dispense Refill  . amoxicillin (AMOXIL) 500 MG tablet Take 4 tablets (2,000 mg total) by mouth as directed. 1 hour prior to dental work (Patient not taking: Reported on 04/27/2021) 4 tablet 3  . empagliflozin (JARDIANCE) 10 MG  TABS tablet Take 1 tablet (10 mg total) by mouth daily before breakfast. 30 tablet 6  . furosemide (LASIX) 40 MG tablet Take 1 tablet (40 mg total) by mouth daily. 30 tablet 11  . gabapentin (NEURONTIN) 100 MG capsule Take 1 capsule (100 mg total) by mouth 3 (three) times daily. 30 capsule 0  . hydrALAZINE (APRESOLINE) 25 MG tablet Take 1 tablet (25 mg total) by mouth 3 (three) times daily. 270 tablet 3  . isosorbide mononitrate (IMDUR) 60 MG 24 hr tablet Take 1 tablet (60 mg total) by mouth daily. 60 tablet 4  . sacubitril-valsartan (ENTRESTO) 97-103 MG Take 1 tablet by mouth 2 (two) times daily. 180 tablet 3  . spironolactone (ALDACTONE) 25 MG tablet TAKE ONE TABLET BY MOUTH DAILY (Patient taking differently: Take 25 mg by mouth daily.) 90 tablet 3  . warfarin (COUMADIN) 5 MG tablet Take 1 and 1/2 tablets by mouth daily except 2 tablets on Monday, Wednesday, and Friday or as directed by Anticoagulation Clinic. 55 tablet 1     Assessment: 60 y.o. male admitted LE edema and fevers, h/o Aflutter, to continue Coumadin  Goal of Therapy:  INR 2-3 Monitor platelets by anticoagulation protocol: Yes   Plan:  Coumadin 10  mg today Daily INR  Caryl Pina 05/06/2021,12:58 AM

## 2021-05-06 NOTE — ED Notes (Signed)
Received verbal report from [place]

## 2021-05-06 NOTE — Progress Notes (Signed)
Lower extremity venous has been completed.   Preliminary results in CV Proc.   Abram Sander 05/06/2021 1:30 PM

## 2021-05-06 NOTE — Progress Notes (Signed)
Sent message to provider to confirm that provider is aware patient is refusing medication and has his home medications in his posssesion.

## 2021-05-06 NOTE — ED Notes (Signed)
STILL NEEDS URINE.

## 2021-05-06 NOTE — ED Notes (Signed)
Provider sent a message requesting pain meds at this time

## 2021-05-06 NOTE — ED Notes (Signed)
Pharmacist at bedside talking with patient about taking his medications.

## 2021-05-06 NOTE — Plan of Care (Signed)

## 2021-05-06 NOTE — Evaluation (Signed)
Physical Therapy Evaluation Patient Details Name: [name] MRN: [id number] DOB: 19-Mar-1961 Today's Date: 05/06/2021   History of Present Illness  Pt is a 60 y/o male admitted 5/19 secondary to BLE pain and swelling. Workup pending. PMH includes HTN, s/p AVR, CHF, and DM.  Clinical Impression  Pt admitted secondary to problem above with deficits below. Pt requiring mod A to stand at EOB this session. Difficulty bearing weight on BLE secondary to pain and numbness. Pt requiring 3 attempts to stand this session. Given current deficits, feel pt will likely benefit from SNF level therapies at d/c, however, if he progresses well with mobility, may be able to d/c home with HHPT. Will continue to follow acutely to maximize functional mobility independence and safety.     Follow Up Recommendations SNF;Supervision for mobility/OOB (vs HHPT if pt progresses well)    Equipment Recommendations  Rolling walker with 5" wheels;3in1 (PT);Wheelchair (measurements PT);Wheelchair cushion (measurements PT)    Recommendations for Other Services       Precautions / Restrictions Precautions Precautions: Fall Restrictions Weight Bearing Restrictions: No      Mobility  Bed Mobility Overal bed mobility: Needs Assistance Bed Mobility: Sit to Supine;Supine to Sit     Supine to sit: Supervision Sit to supine: Supervision   General bed mobility comments: Supervision for safety.    Transfers Overall transfer level: Needs assistance Equipment used: Rolling walker (2 wheeled) Transfers: Sit to/from Stand Sit to Stand: Mod assist         General transfer comment: Required 3 attempts to stand this session. Mod A for lift assist. Pt with difficulty putting pressure on BLE secondary to pain.  Ambulation/Gait                Stairs            Wheelchair Mobility    Modified Rankin (Stroke Patients Only)       Balance Overall balance assessment: Needs  assistance Sitting-balance support: No upper extremity supported;Feet supported Sitting balance-Leahy Scale: Good     Standing balance support: Bilateral upper extremity supported Standing balance-Leahy Scale: Poor Standing balance comment: Reliant on BUE support                             Pertinent Vitals/Pain Pain Assessment: Faces Faces Pain Scale: Hurts even more Pain Location: bilateral feet Pain Descriptors / Indicators: Aching;Pins and needles Pain Intervention(s): Monitored during session;Limited activity within patient's tolerance;Repositioned    Home Living Family/patient expects to be discharged to:: Private residence Living Arrangements: Non-relatives/Friends Available Help at Discharge: Family;Available PRN/intermittently Type of Home: Other(Comment) (townhouse) Home Access: Level entry     Home Layout: Two level Home Equipment: None      Prior Function Level of Independence: Independent               Hand Dominance        Extremity/Trunk Assessment   Upper Extremity Assessment Upper Extremity Assessment: Defer to OT evaluation    Lower Extremity Assessment Lower Extremity Assessment: RLE deficits/detail;LLE deficits/detail RLE Deficits / Details: Reports pain and decreased sensation in feet. Could feel some sensation. LLE Deficits / Details: Reports pain and decreased sensation in feet. Could feel some sensation.    Cervical / Trunk Assessment Cervical / Trunk Assessment: Normal  Communication   Communication: No difficulties  Cognition Arousal/Alertness: Awake/alert Behavior During Therapy: WFL for tasks assessed/performed Overall Cognitive Status: No family/caregiver present to determine baseline  cognitive functioning                                        General Comments      Exercises     Assessment/Plan    PT Assessment Patient needs continued PT services  PT Problem List Decreased  strength;Decreased activity tolerance;Decreased balance;Decreased mobility;Decreased knowledge of use of DME;Decreased knowledge of precautions;Impaired sensation;Pain       PT Treatment Interventions Stair training;Gait training;DME instruction;Therapeutic activities;Functional mobility training;Balance training;Therapeutic exercise;Patient/family education    PT Goals (Current goals can be found in the Care Plan section)  Acute Rehab PT Goals Patient Stated Goal: to be able to walk PT Goal Formulation: With patient Time For Goal Achievement: 05/20/21 Potential to Achieve Goals: Good    Frequency Min 3X/week   Barriers to discharge        Co-evaluation               AM-PAC PT "6 Clicks" Mobility  Outcome Measure Help needed turning from your back to your side while in a flat bed without using bedrails?: A Little Help needed moving from lying on your back to sitting on the side of a flat bed without using bedrails?: A Little Help needed moving to and from a bed to a chair (including a wheelchair)?: A Lot Help needed standing up from a chair using your arms (e.g., wheelchair or bedside chair)?: A Lot Help needed to walk in hospital room?: Total Help needed climbing 3-5 steps with a railing? : Total 6 Click Score: 12    End of Session Equipment Utilized During Treatment: Gait belt Activity Tolerance: Patient limited by pain Patient left: in bed;with call bell/phone within reach (on stretcher in ED) Nurse Communication: Mobility status PT Visit Diagnosis: Pain;Difficulty in walking, not elsewhere classified (R26.2) Pain - Right/Left:  (bilateral) Pain - part of body: Ankle and joints of foot    Time: 5643-3295 PT Time Calculation (min) (ACUTE ONLY): 20 min   Charges:   PT Evaluation $PT Eval Moderate Complexity: 1 Mod          Reuel Derby, PT, DPT  Acute Rehabilitation Services  Pager: [phone] Office: [phone]   Rudean Hitt 05/06/2021, 2:50 PM

## 2021-05-06 NOTE — Progress Notes (Signed)
PROGRESS NOTE    [name]  QBH:[id number] DOB: 1961/01/21 DOA: 05/05/2021 PCP: Flossie Buffy, NP   Brief Narrative: 60 year old male diabetes AVR on Coumadin history of hypertension and congestive heart failure admitted with complaints of bilateral foot pain and swelling.  He also reports a productive cough. He denies chest pain does report shortness of breath. Assessment & Plan:   Active Problems:   Congestive dilated cardiomyopathy (HCC)   Essential hypertension   CKD (chronic kidney disease), stage II-III   S/P AVR   Fever  #1 bilateral foot pain work-up includes x-rays of both feet and leg and venous Doppler which does not reveal anything acute.  No fracture seen he does have some chronic changes heel spurs.  His pain is likely due to neuropathy from longstanding type 2 diabetes.  Though his uric acid level was elevated he does not have any significant findings of gout. Venous Doppler ordered pending  #2 fever when patient visited the ED on 05/02/2021 he had a temperature of 101.5.  He has been afebrile today and his T-max is 100.8.  Cultures have been negative so far.  Mild leukocytosis at 12.6.  No evidence of active acute infection at this time noted.  #3 acute on chronic systolic heart failure-he was admitted with shortness of breath.  His last echo from February 2022 with ejection fraction 25 to 25%. Exam with trace bilateral pitting edema, decreased breath sounds at the bases chest x-rays with bibasilar atelectasis. BNP 1072 Continue Lasix 40 mg daily. Troponin 79, 84.  Trend. Troponin mildly elevated likely from demand ischemia Continue Lasix Entresto and Aldactone CT chest-mild scarring in bases bilaterally with stable cardiomegaly no acute abnormality. Urine drug screen pending.  #4 history of aortic valve replacement for aortic stenosis on Coumadin  #5 type 2 diabetes complicated with neuropathy with an A1c of 7.1  Continue Jardiance  #6 stage IIIa CKD  at baseline  #7 history of essential hypertension on Lasix 40 mg daily, hydralazine 25 mg 3 times a day, Imdur 60 mg daily, Aldactone 25 mg daily.  Estimated body mass index is 35.57 kg/m as calculated from the following:   Height as of this encounter: 5\' 11"  (1.803 m).   Weight as of this encounter: 115.7 kg.  DVT prophylaxis:coumadin Code Status: full Family Communication: His brother was on the phone while I was in the room with the patient Disposition Plan:  Status is: Inpatient  Dispo: The patient is from: Home              Anticipated d/c is to: Home              Patient currently is not medically stable to d/c.   Difficult to place patient No    Consultants:   None  Procedures: None Antimicrobials none  Subjective: He is resting in bed complaining of cramps in both lower extremities He reports cough Denies chest pain He reports shortness of breath  Objective: Vitals:   05/06/21 0502 05/06/21 0700 05/06/21 0730 05/06/21 1000  BP: (!) 85/67 100/67 (!) 112/94 121/76  Pulse: 62 (!) 57 64 67  Resp: 18 18 18 18   Temp:   98.4 F (36.9 C)   TempSrc:   Oral   SpO2: 93% 99% 96% 95%  Weight:      Height:       No intake or output data in the 24 hours ending 05/06/21 1223 Filed Weights   05/06/21 0137  Weight: 115.7 kg  Examination:  General exam: Appears calm and comfortable  Respiratory system: Clear to auscultation. Respiratory effort normal. Cardiovascular system: S1 & S2 heard, RRR. No JVD, murmurs, rubs, gallops or clicks. No pedal edema. Gastrointestinal system: Abdomen is nondistended, soft and nontender. No organomegaly or masses felt. Normal bowel sounds heard. Central nervous system: Alert and oriented. No focal neurological deficits. Extremities: Trace bilateral pitting edema Skin: No rashes, lesions or ulcers Psychiatry: Judgement and insight appear normal. Mood & affect appropriate.     Data Reviewed: I have personally reviewed following  labs and imaging studies  CBC: Recent Labs  Lab 05/02/21 2033 05/05/21 1600  WBC 11.3* 12.6*  NEUTROABS  --  10.2*  HGB 15.1 14.2  HCT 47.0 44.9  MCV 86.1 87.5  PLT 249 [id number]   Basic Metabolic Panel: Recent Labs  Lab 05/02/21 2033 05/05/21 1600  NA 135 134*  K 4.2 4.8  CL 103 100  CO2 24 24  GLUCOSE 126* 116*  BUN 19 23*  CREATININE 1.70* 1.66*  CALCIUM 9.3 9.2   GFR: Estimated Creatinine Clearance: 61.2 mL/min (A) (by C-G formula based on SCr of 1.66 mg/dL (H)). Liver Function Tests: Recent Labs  Lab 05/05/21 1600  AST 25  ALT 21  ALKPHOS 93  BILITOT 2.1*  PROT 7.5  ALBUMIN 3.3*   No results for input(s): LIPASE, AMYLASE in the last 168 hours. No results for input(s): AMMONIA in the last 168 hours. Coagulation Profile: Recent Labs  Lab 05/03/21 0343 05/05/21 2023  INR 1.8* 2.5*   Cardiac Enzymes: Recent Labs  Lab 05/02/21 2033  CKTOTAL 206   BNP (last 3 results) No results for input(s): PROBNP in the last 8760 hours. HbA1C: Recent Labs    05/06/21 0626  HGBA1C 7.1*   CBG: Recent Labs  Lab 05/06/21 1146  GLUCAP 108*   Lipid Profile: No results for input(s): CHOL, HDL, LDLCALC, TRIG, CHOLHDL, LDLDIRECT in the last 72 hours. Thyroid Function Tests: No results for input(s): TSH, T4TOTAL, FREET4, T3FREE, THYROIDAB in the last 72 hours. Anemia Panel: No results for input(s): VITAMINB12, FOLATE, FERRITIN, TIBC, IRON, RETICCTPCT in the last 72 hours. Sepsis Labs: Recent Labs  Lab 05/03/21 0321 05/05/21 2023  LATICACIDVEN 1.0 1.4    Recent Results (from the past 240 hour(s))  Resp Panel by RT-PCR (Flu A&B, Covid) Nasopharyngeal Swab     Status: None   Collection Time: 05/03/21  3:47 AM   Specimen: Nasopharyngeal Swab; Nasopharyngeal(NP) swabs in vial transport medium  Result Value Ref Range Status   SARS Coronavirus 2 by RT PCR NEGATIVE NEGATIVE Final    Comment: (NOTE) SARS-CoV-2 target nucleic acids are NOT DETECTED.  The SARS-CoV-2  RNA is generally detectable in upper respiratory specimens during the acute phase of infection. The lowest concentration of SARS-CoV-2 viral copies this assay can detect is 138 copies/mL. A negative result does not preclude SARS-Cov-2 infection and should not be used as the sole basis for treatment or other patient management decisions. A negative result may occur with  improper specimen collection/handling, submission of specimen other than nasopharyngeal swab, presence of viral mutation(s) within the areas targeted by this assay, and inadequate number of viral copies(<138 copies/mL). A negative result must be combined with clinical observations, patient history, and epidemiological information. The expected result is Negative.  Fact Sheet for Patients:  EntrepreneurPulse.com.au  Fact Sheet for Healthcare Providers:  IncredibleEmployment.be  This test is no t yet approved or cleared by the [place] and  has been authorized  for detection and/or diagnosis of SARS-CoV-2 by FDA under an Emergency Use Authorization (EUA). This EUA will remain  in effect (meaning this test can be used) for the duration of the COVID-19 declaration under Section 564(b)(1) of the Act, 21 U.S.C.section 360bbb-3(b)(1), unless the authorization is terminated  or revoked sooner.       Influenza A by PCR NEGATIVE NEGATIVE Final   Influenza B by PCR NEGATIVE NEGATIVE Final    Comment: (NOTE) The Xpert Xpress SARS-CoV-2/FLU/RSV plus assay is intended as an aid in the diagnosis of influenza from Nasopharyngeal swab specimens and should not be used as a sole basis for treatment. Nasal washings and aspirates are unacceptable for Xpert Xpress SARS-CoV-2/FLU/RSV testing.  Fact Sheet for Patients: EntrepreneurPulse.com.au  Fact Sheet for Healthcare Providers: IncredibleEmployment.be  This test is not yet approved or cleared by the  [place] FDA and has been authorized for detection and/or diagnosis of SARS-CoV-2 by FDA under an Emergency Use Authorization (EUA). This EUA will remain in effect (meaning this test can be used) for the duration of the COVID-19 declaration under Section 564(b)(1) of the Act, 21 U.S.C. section 360bbb-3(b)(1), unless the authorization is terminated or revoked.  Performed at [facility], [place] [address] [place], [place] [address]   Blood culture (routine x 2)     Status: None (Preliminary result)   Collection Time: 05/03/21  4:15 AM   Specimen: BLOOD  Result Value Ref Range Status   Specimen Description BLOOD SITE NOT SPECIFIED  Final   Special Requests   Final    BOTTLES DRAWN AEROBIC AND ANAEROBIC Blood Culture results may not be optimal due to an inadequate volume of blood received in culture bottles   Culture   Final    NO GROWTH 3 DAYS Performed at [facility], [place] [address] [place], [place] [address]    Report Status PENDING  Incomplete  Blood culture (routine x 2)     Status: None (Preliminary result)   Collection Time: 05/03/21  4:17 AM   Specimen: BLOOD  Result Value Ref Range Status   Specimen Description BLOOD SITE NOT SPECIFIED  Final   Special Requests   Final    BOTTLES DRAWN AEROBIC AND ANAEROBIC Blood Culture results may not be optimal due to an inadequate volume of blood received in culture bottles   Culture   Final    NO GROWTH 3 DAYS Performed at [facility], [place] [address], [place], [place] [address]    Report Status PENDING  Incomplete  Resp Panel by RT-PCR (Flu A&B, Covid) Nasopharyngeal Swab     Status: None   Collection Time: 05/05/21  8:24 PM   Specimen: Nasopharyngeal Swab; Nasopharyngeal(NP) swabs in vial transport medium  Result Value Ref Range Status   SARS Coronavirus 2 by RT PCR NEGATIVE NEGATIVE Final    Comment: (NOTE) SARS-CoV-2 target nucleic acids are NOT DETECTED.  The SARS-CoV-2 RNA is generally detectable in  upper respiratory specimens during the acute phase of infection. The lowest concentration of SARS-CoV-2 viral copies this assay can detect is 138 copies/mL. A negative result does not preclude SARS-Cov-2 infection and should not be used as the sole basis for treatment or other patient management decisions. A negative result may occur with  improper specimen collection/handling, submission of specimen other than nasopharyngeal swab, presence of viral mutation(s) within the areas targeted by this assay, and inadequate number of viral copies(<138 copies/mL). A negative result must be combined with clinical observations,  patient history, and epidemiological information. The expected result is Negative.  Fact Sheet for Patients:  EntrepreneurPulse.com.au  Fact Sheet for Healthcare Providers:  IncredibleEmployment.be  This test is no t yet approved or cleared by the [place] FDA and  has been authorized for detection and/or diagnosis of SARS-CoV-2 by FDA under an Emergency Use Authorization (EUA). This EUA will remain  in effect (meaning this test can be used) for the duration of the COVID-19 declaration under Section 564(b)(1) of the Act, 21 U.S.C.section 360bbb-3(b)(1), unless the authorization is terminated  or revoked sooner.       Influenza A by PCR NEGATIVE NEGATIVE Final   Influenza B by PCR NEGATIVE NEGATIVE Final    Comment: (NOTE) The Xpert Xpress SARS-CoV-2/FLU/RSV plus assay is intended as an aid in the diagnosis of influenza from Nasopharyngeal swab specimens and should not be used as a sole basis for treatment. Nasal washings and aspirates are unacceptable for Xpert Xpress SARS-CoV-2/FLU/RSV testing.  Fact Sheet for Patients: EntrepreneurPulse.com.au  Fact Sheet for Healthcare Providers: IncredibleEmployment.be  This test is not yet approved or cleared by the [place] FDA and has been  authorized for detection and/or diagnosis of SARS-CoV-2 by FDA under an Emergency Use Authorization (EUA). This EUA will remain in effect (meaning this test can be used) for the duration of the COVID-19 declaration under Section 564(b)(1) of the Act, 21 U.S.C. section 360bbb-3(b)(1), unless the authorization is terminated or revoked.  Performed at [facility], [place] [address], [place],  [address]          Radiology Studies: DG Chest 1 View  Result Date: 05/05/2021 CLINICAL DATA:  Chest pain. EXAM: CHEST  1 VIEW COMPARISON:  May 02, 2021. FINDINGS: Stable cardiomegaly. Sternotomy wires are noted. No pneumothorax or pleural effusion is noted. Both lungs are clear. The visualized skeletal structures are unremarkable. IMPRESSION: No active disease. Electronically Signed   By: Marijo Conception M.D.   On: 05/05/2021 16:22   DG Ankle Complete Left  Result Date: 05/05/2021 CLINICAL DATA:  Ankle pain and fevers, initial encounter EXAM: LEFT ANKLE COMPLETE - 3+ VIEW COMPARISON:  None. FINDINGS: There is no evidence of fracture, dislocation, or joint effusion. Mild tarsal degenerative changes are noted. No acute soft tissue abnormality is seen. IMPRESSION: No acute abnormality noted. Electronically Signed   By: Inez Catalina M.D.   On: 05/05/2021 22:03   DG Ankle Complete Right  Result Date: 05/05/2021 CLINICAL DATA:  Ankle pain and fevers, initial encounter EXAM: RIGHT ANKLE - COMPLETE 3+ VIEW COMPARISON:  None. FINDINGS: Calcaneal spurring is noted. No significant soft tissue abnormality is noted. Tiny bony density is noted adjacent to the distal fibula likely related to prior trauma and nonunion. No new focal abnormality is seen. IMPRESSION: Chronic changes without acute abnormality. Electronically Signed   By: Inez Catalina M.D.   On: 05/05/2021 22:02   CT Chest Wo Contrast  Result Date: 05/05/2021 CLINICAL DATA:  Persistent cough EXAM: CT CHEST WITHOUT CONTRAST TECHNIQUE:  Multidetector CT imaging of the chest was performed following the standard protocol without IV contrast. COMPARISON:  Chest x-ray from earlier in the same day, CT from 04/16/2020. FINDINGS: Cardiovascular: Somewhat limited due to lack of IV contrast. Aortic calcifications are noted without aneurysmal dilatation. Heart is enlarged in size similar to that seen on prior chest x-ray. Aortic valve calcifications are noted. Mediastinum/Nodes: Thoracic inlet demonstrates some scattered calcifications within the thyroid although no discrete nodule is seen. No sizable hilar or  mediastinal adenopathy is noted. The esophagus is within normal limits. Lungs/Pleura: Mild scarring is noted in the bases bilaterally. No sizable effusion is seen. No focal infiltrate or sizable parenchymal nodule is noted. Upper Abdomen: Visualized upper abdomen shows no acute abnormality. Musculoskeletal: Degenerative changes of the thoracic spine are noted. IMPRESSION: Mild scarring in the bases bilaterally. Stable cardiomegaly. No acute abnormality is noted. Aortic Atherosclerosis (ICD10-I70.0). Electronically Signed   By: Inez Catalina M.D.   On: 05/05/2021 21:42   DG Foot Complete Left  Result Date: 05/05/2021 CLINICAL DATA:  Foot pain and fevers, initial encounter EXAM: LEFT FOOT - COMPLETE 3+ VIEW COMPARISON:  None. FINDINGS: Mild tarsal degenerative changes are noted. No acute fracture or dislocation is noted. No soft tissue abnormality is seen. IMPRESSION: Negative. Electronically Signed   By: Inez Catalina M.D.   On: 05/05/2021 22:03   DG Foot Complete Right  Result Date: 05/05/2021 CLINICAL DATA:  Foot pain and fevers, initial encounter EXAM: RIGHT FOOT COMPLETE - 3+ VIEW COMPARISON:  None. FINDINGS: Calcaneal spurring is noted. Mild tarsal degenerative changes are seen. No acute fracture or dislocation is noted. No soft tissue abnormality is seen. IMPRESSION: No acute abnormality noted. Electronically Signed   By: Inez Catalina M.D.    On: 05/05/2021 22:04        Scheduled Meds: . empagliflozin  10 mg Oral QAC breakfast  . furosemide  40 mg Oral Daily  . gabapentin  100 mg Oral TID  . hydrALAZINE  25 mg Oral TID  . isosorbide mononitrate  60 mg Oral Daily  . sacubitril-valsartan  1 tablet Oral BID  . spironolactone  25 mg Oral Daily  . warfarin  10 mg Oral ONCE-1600  . Warfarin - Pharmacist Dosing Inpatient   Does not apply q1600   Continuous Infusions:   LOS: 0 days    Georgette Shell, MD 05/06/2021, 12:23 PM

## 2021-05-07 ENCOUNTER — Other Ambulatory Visit (HOSPITAL_COMMUNITY): Payer: Medicare (Managed Care)

## 2021-05-07 ENCOUNTER — Observation Stay (HOSPITAL_COMMUNITY): Payer: Medicare (Managed Care)

## 2021-05-07 DIAGNOSIS — R509 Fever, unspecified: Secondary | ICD-10-CM

## 2021-05-07 LAB — ECHOCARDIOGRAM COMPLETE
AV Mean grad: 12 mmHg
AV Peak grad: 23.6 mmHg
Ao pk vel: 2.43 m/s
Area-P 1/2: 6.83 cm2
Height: 71 in
S' Lateral: 4.85 cm
Weight: 4232.83 oz

## 2021-05-07 LAB — PROTIME-INR
INR: 3.1 — ABNORMAL HIGH (ref 0.8–1.2)
Prothrombin Time: 31.7 seconds — ABNORMAL HIGH (ref 11.4–15.2)

## 2021-05-07 LAB — URINE CULTURE: Culture: NO GROWTH

## 2021-05-07 LAB — GLUCOSE, CAPILLARY: Glucose-Capillary: 115 mg/dL — ABNORMAL HIGH (ref 70–99)

## 2021-05-07 MED ORDER — ISOSORBIDE MONONITRATE ER 30 MG PO TB24
30.0000 mg | ORAL_TABLET | Freq: Every day | ORAL | Status: DC
  Administered 2021-05-08 – 2021-05-14 (×7): 30 mg via ORAL
  Filled 2021-05-07 (×7): qty 1

## 2021-05-07 MED ORDER — AZITHROMYCIN 500 MG PO TABS
500.0000 mg | ORAL_TABLET | Freq: Every day | ORAL | Status: DC
  Administered 2021-05-07 – 2021-05-09 (×3): 500 mg via ORAL
  Filled 2021-05-07 (×3): qty 1

## 2021-05-07 MED ORDER — HYDRALAZINE HCL 10 MG PO TABS
10.0000 mg | ORAL_TABLET | Freq: Three times a day (TID) | ORAL | Status: DC
  Administered 2021-05-07 – 2021-05-14 (×17): 10 mg via ORAL
  Filled 2021-05-07 (×20): qty 1

## 2021-05-07 MED ORDER — PERFLUTREN LIPID MICROSPHERE
1.0000 mL | INTRAVENOUS | Status: AC | PRN
Start: 2021-05-07 — End: 2021-05-07
  Administered 2021-05-07: 2 mL via INTRAVENOUS
  Filled 2021-05-07: qty 10

## 2021-05-07 MED ORDER — GABAPENTIN 100 MG PO CAPS
200.0000 mg | ORAL_CAPSULE | Freq: Three times a day (TID) | ORAL | Status: DC
  Administered 2021-05-07 – 2021-05-14 (×21): 200 mg via ORAL
  Filled 2021-05-07 (×21): qty 2

## 2021-05-07 NOTE — Progress Notes (Addendum)
Occupational Therapy Evaluation Patient Details Name: [name] MRN: [id number] DOB: [birth year] Today's Date: 05/07/2021    History of Present Illness Pt is a 60 y/o male admitted 5/19 secondary to BLE pain and swelling. Workup pending. PMH includes HTN, s/p AVR, CHF, and DM.   Clinical Impression   Pt presents with above diagonsis. PTA pt PLOF living at home with friend in 2 level home with 1 step entrance, reports mostly I with ADLs and IADLs with no AE in home environment. Pt currently limited with safe ADL engagement due to pain and neuropathy of BLE per MD while in room. Resident will benefit from continued acute OT and HHOT in home setting to improve independence and set up of living environment for safety.     Follow Up Recommendations  Home health OT    Equipment Recommendations  3 in 1 bedside commode    Recommendations for Other Services       Precautions / Restrictions Precautions Precautions: Fall Restrictions Weight Bearing Restrictions: No      Mobility Bed Mobility Overal bed mobility: Needs Assistance Bed Mobility: Sit to Supine;Supine to Sit     Supine to sit: Supervision Sit to supine: Supervision   General bed mobility comments: sitting EoB on entry    Transfers Overall transfer level: Needs assistance Equipment used: Rolling walker (2 wheeled) Transfers: Sit to/from Stand Sit to Stand: Mod assist;From elevated surface;+2 physical assistance         General transfer comment: modAx2 for power up and steadying in RW with increased use of momentum, despite multimodal cues for pushing off from sitting surface pt pulls up on RW at last second    Balance Overall balance assessment: Needs assistance Sitting-balance support: No upper extremity supported;Feet supported Sitting balance-Leahy Scale: Good     Standing balance support: Bilateral upper extremity supported Standing balance-Leahy Scale: Poor Standing balance comment: Reliant on BUE  support                           ADL either performed or assessed with clinical judgement   ADL Overall ADL's : Needs assistance/impaired Eating/Feeding: Set up;Sitting   Grooming: Wash/dry face;Oral care;Min guard;Minimal assistance;Standing (standing at sink with recliner presented behind, for safety and tolerance.) Grooming Details (indicate cue type and reason): initial transfer to sink, pt reported pain and fatigue and required to sit. 2nd attempt standing for shaving resident able to stand with min guard for safety, however Mod A push to stand with cueing for hand placement and sequencing. Pt dependent on leaning on counter. Upper Body Bathing: Modified independent;Sitting   Lower Body Bathing: Minimal assistance;Sitting/lateral leans   Upper Body Dressing : Sitting;Set up   Lower Body Dressing: Minimal assistance   Toilet Transfer: Minimal assistance;+2 for physical assistance Toilet Transfer Details (indicate cue type and reason): simulated toilet transfer from bed to chair at sink. heavy cueing for hand placement with RW and sequencing to reach for seated surface prior to sitting.   Toileting - Clothing Manipulation Details (indicate cue type and reason): use of urinal in standing with RW +2 for safety     Functional mobility during ADLs: Minimal assistance;+2 for physical assistance;+2 for safety/equipment       Vision         Perception     Praxis      Pertinent Vitals/Pain Pain Assessment: Faces Faces Pain Scale: Hurts even more Pain Location: bilateral feet and thighs Pain Descriptors /  Indicators: Aching;Pins and needles Pain Intervention(s): Limited activity within patient's tolerance;Monitored during session;Repositioned     Hand Dominance Right   Extremity/Trunk Assessment Upper Extremity Assessment Upper Extremity Assessment: Overall WFL for tasks assessed   Lower Extremity Assessment Lower Extremity Assessment: Defer to PT  evaluation RLE Deficits / Details: Reports pain and decreased sensation in feet. Could feel some sensation. LLE Deficits / Details: Reports pain and decreased sensation in feet. Could feel some sensation.   Cervical / Trunk Assessment Cervical / Trunk Assessment: Normal   Communication Communication Communication: No difficulties   Cognition Arousal/Alertness: Awake/alert Behavior During Therapy: WFL for tasks assessed/performed Overall Cognitive Status: No family/caregiver present to determine baseline cognitive functioning                                     General Comments  VSS on RA    Exercises     Shoulder Instructions      Home Living Family/patient expects to be discharged to:: Private residence Living Arrangements: Non-relatives/Friends Available Help at Discharge: Family;Available PRN/intermittently Type of Home: Other(Comment) (townhouse) Home Access: Level entry     Home Layout: Two level Alternate Level Stairs-Number of Steps: flight Alternate Level Stairs-Rails: Right Bathroom Shower/Tub: Teacher, early years/pre: Standard     Home Equipment: None   Additional Comments: reports living with friend      Prior Functioning/Environment Level of Independence: Independent                 OT Problem List: Decreased strength;Decreased activity tolerance;Impaired balance (sitting and/or standing);Decreased safety awareness;Pain;Increased edema      OT Treatment/Interventions: Self-care/ADL training;Therapeutic exercise;Therapeutic activities;Patient/family education;Balance training    OT Goals(Current goals can be found in the care plan section) Acute Rehab OT Goals Patient Stated Goal: to be able to walk OT Goal Formulation: With patient Time For Goal Achievement: 05/21/21 Potential to Achieve Goals: Fair ADL Goals Pt Will Perform Grooming: with supervision;with adaptive equipment;standing Pt Will Perform Lower Body  Dressing: with min guard assist;sit to/from stand;with adaptive equipment Pt Will Transfer to Toilet: with min assist;stand pivot transfer  OT Frequency: Min 2X/week   Barriers to D/C: Inaccessible home environment          Co-evaluation PT/OT/SLP Co-Evaluation/Treatment: Yes Reason for Co-Treatment: To address functional/ADL transfers;Complexity of the patient's impairments (multi-system involvement);For patient/therapist safety   OT goals addressed during session: ADL's and self-care;Proper use of Adaptive equipment and DME      AM-PAC OT "6 Clicks" Daily Activity     Outcome Measure Help from another person eating meals?: A Little Help from another person taking care of personal grooming?: A Little Help from another person toileting, which includes using toliet, bedpan, or urinal?: A Little Help from another person bathing (including washing, rinsing, drying)?: A Lot Help from another person to put on and taking off regular upper body clothing?: A Little Help from another person to put on and taking off regular lower body clothing?: A Lot 6 Click Score: 16   End of Session Equipment Utilized During Treatment: Gait belt;Rolling walker Nurse Communication: Mobility status  Activity Tolerance: Patient limited by fatigue;Patient limited by pain Patient left: in chair;with call bell/phone within reach;with chair alarm set;with nursing/sitter in room (MD)  OT Visit Diagnosis: Muscle weakness (generalized) (M62.81);Pain Pain - Right/Left:  (BLE ankles and feet) Pain - part of body: Ankle and joints of foot  Time: 9509-3267 OT Time Calculation (min): 50 min Charges:  OT General Charges $OT Visit: 1 Visit OT Evaluation $OT Eval Moderate Complexity: 1 Mod OT Treatments $Self Care/Home Management : 8-22 mins  Minus Breeding, MSOT, OTR/L  Supplemental Rehabilitation Services  [phone]   Marius Ditch 05/07/2021, 11:21 AM

## 2021-05-07 NOTE — Progress Notes (Addendum)
Addendum:  It was brought to therapist attention by pt overestimation of ambulation distance in note below. Pt only ambulated 8 feet with PT/OT to sink. Therapist has apologized for the mistake and assured pt it would be changed in his records. Please see below.  Shaden Higley B. Migdalia Dk PT, DPT Acute Rehabilitation Services Pager [phone] Office [phone]  Physical Therapy Treatment Patient Details Name: [name] MRN: [id number] DOB: 1961-08-31 Today's Date: 05/07/2021    History of Present Illness Pt is a 60 y/o male admitted 5/19 secondary to BLE pain and swelling. Workup pending. PMH includes HTN, s/p AVR, CHF, and DM.    PT Comments    Pt continues to have neuropathic pain in his feet and legs that is impacting his safe mobility. Pt sitting EOB on entry. With attempts to stand pt requires modAx2 for power up and steadying with RW. Pt able to ambulate with minAx2 and constant cuing for upright posture and proximity to RW. Pt with good UE able to assist in offweighting LE with ambulation. Pt worked with OT at sink to wash face and able to stand in tripoded position for ~5 minutes. Given pt's main complaint is nerve pain in feet and otherwise is strong PT recommending HHPT and wheelchair for mobility. PT will continue to follow acutely.      Follow Up Recommendations  Supervision for mobility/OOB;Home health PT     Equipment Recommendations  Rolling walker with 5" wheels;3in1 (PT);Wheelchair (measurements PT);Wheelchair cushion (measurements PT)       Precautions / Restrictions Precautions Precautions: Fall Restrictions Weight Bearing Restrictions: No    Mobility  Bed Mobility               General bed mobility comments: sitting EoB on entry    Transfers Overall transfer level: Needs assistance Equipment used: Rolling walker (2 wheeled) Transfers: Sit to/from Stand Sit to Stand: Mod assist;From elevated surface;+2 physical assistance          General transfer comment: modAx2 for power up and steadying in RW with increased use of momentum, despite multimodal cues for pushing off from sitting surface pt pulls up on RW at last second  Ambulation/Gait Ambulation/Gait assistance: Min assist;+2 physical assistance Gait Distance (Feet):  8 feet Assistive device: Rolling walker (2 wheeled) Gait Pattern/deviations: Step-through pattern;Decreased step length - right;Decreased step length - left;Antalgic;Trunk flexed Gait velocity: slowed Gait velocity interpretation: <1.31 ft/sec, indicative of household ambulator General Gait Details: minAx2 and maximal multimodal cuing for upright posture, and proximity to RW. pt reports understanding but has poor carryover         Balance Overall balance assessment: Needs assistance Sitting-balance support: No upper extremity supported;Feet supported Sitting balance-Leahy Scale: Good     Standing balance support: Bilateral upper extremity supported Standing balance-Leahy Scale: Poor Standing balance comment: Reliant on BUE support                            Cognition Arousal/Alertness: Awake/alert Behavior During Therapy: WFL for tasks assessed/performed Overall Cognitive Status: No family/caregiver present to determine baseline cognitive functioning                                           General Comments General comments (skin integrity, edema, etc.): VSS on RA      Pertinent Vitals/Pain Pain Assessment: Faces Faces  Pain Scale: Hurts even more Pain Location: bilateral feet and thighs Pain Descriptors / Indicators: Aching;Pins and needles Pain Intervention(s): Limited activity within patient's tolerance;Monitored during session;Repositioned           PT Goals (current goals can now be found in the care plan section) Acute Rehab PT Goals Patient Stated Goal: to be able to walk PT Goal Formulation: With patient Time For Goal Achievement:  05/20/21 Potential to Achieve Goals: Good Progress towards PT goals: Progressing toward goals    Frequency    Min 3X/week      PT Plan Current plan remains appropriate       AM-PAC PT "6 Clicks" Mobility   Outcome Measure  Help needed turning from your back to your side while in a flat bed without using bedrails?: A Little Help needed moving from lying on your back to sitting on the side of a flat bed without using bedrails?: A Little Help needed moving to and from a bed to a chair (including a wheelchair)?: A Lot Help needed standing up from a chair using your arms (e.g., wheelchair or bedside chair)?: A Lot Help needed to walk in hospital room?: Total Help needed climbing 3-5 steps with a railing? : Total 6 Click Score: 12    End of Session Equipment Utilized During Treatment: Gait belt Activity Tolerance: Patient limited by pain Patient left: with call bell/phone within reach;with chair alarm set;in chair (on stretcher in ED) Nurse Communication: Mobility status PT Visit Diagnosis: Pain;Difficulty in walking, not elsewhere classified (R26.2) Pain - Right/Left:  (bilateral) Pain - part of body: Ankle and joints of foot     Time: 5427-0623 PT Time Calculation (min) (ACUTE ONLY): 46 min  Charges:  $Gait Training: 8-22 mins                     Ellieana Dolecki B. Migdalia Dk PT, DPT Acute Rehabilitation Services Pager [phone] Office [phone]    [place] 05/07/2021, 9:31 AM

## 2021-05-07 NOTE — Progress Notes (Signed)
ANTICOAGULATION CONSULT NOTE - Follow Up Consult  Pharmacy Consult for Coumadin Indication: hx aflutter, LV thrombus, bioprosthetic AVR  No Known Allergies  Patient Measurements: Height: 5\' 11"  (180.3 cm) Weight: 120 kg (264 lb 8.8 oz) IBW/kg (Calculated) : 75.3  Vital Signs: Temp: 98.5 F (36.9 C) (05/21 0448) Temp Source: Oral (05/21 0448) BP: 94/75 (05/21 0448) Pulse Rate: 54 (05/21 0041)  Labs: Recent Labs    05/05/21 1600 05/05/21 1935 05/05/21 2023 05/06/21 1321 05/06/21 1340 05/06/21 1630 05/07/21 0228  HGB 14.2  --   --  14.1  --   --   --   HCT 44.9  --   --  42.7  --   --   --   PLT 260  --   --  285  --   --   --   LABPROT  --   --  27.0*  --  31.1*  --  31.7*  INR  --   --  2.5*  --  3.0*  --  3.1*  CREATININE 1.66*  --   --  1.69*  --   --   --   TROPONINIHS 79* 84*  --  55*  --  62*  --     Estimated Creatinine Clearance: 61.3 mL/min (A) (by C-G formula based on SCr of 1.69 mg/dL (H)).   Assessment: Anticoag: Warfarin pta for hx aflutter, LV thrombus, bioprosthetic AVR. INR up to 3.1. Dopplers negative for new DVT. -PTA dose Coumadin10 mg MWF, 7.5 mg TTSS with admit INR 2.5  Goal of Therapy:  INR 2-3 Monitor platelets by anticoagulation protocol: Yes   Plan:  Hold Coumadin tonight. DAily INR   Ritu Gagliardo S. Alford Highland, PharmD, BCPS Clinical Staff Pharmacist [email or web address] Alford Highland, [place] 05/07/2021,8:01 AM

## 2021-05-07 NOTE — Progress Notes (Addendum)
PROGRESS NOTE    LOPEZ DENTINGER  BMW:[id number] DOB: 30-Dec-1960 DOA: 05/05/2021 PCP: Flossie Buffy, NP   Brief Narrative: 60 year old male diabetes AVR on Coumadin history of hypertension and congestive heart failure admitted with complaints of bilateral foot pain and swelling.  He also reports a productive cough. He denies chest pain does report shortness of breath. Assessment & Plan:   Active Problems:   Congestive dilated cardiomyopathy (HCC)   Essential hypertension   CKD (chronic kidney disease), stage II-III   S/P AVR   Fever of unknown origin   Fever  #1 bilateral foot pain work-up includes x-rays of both feet and leg and venous Doppler which does not reveal anything acute.  No fracture seen he does have some chronic changes heel spurs.  His pain is likely due to neuropathy from longstanding type 2 diabetes.  Though his uric acid level was elevated he does not have any significant findings of gout. Increase Neurontin to 200 mg 3 times a day. Venous Doppler no DVT. He is adamant about staying another day.  #2 fever likely from bronchitis. when patient visited the ED on 05/02/2021 he had a temperature of 101.5.  He has been afebrile today and his T-max is 100.  Cultures have been negative so far.  Mild leukocytosis at 12.6.  No evidence of active acute infection at this time noted. Patient is bringing up green phlegm with wheezing and shortness of breath.  We will start him on Zithromax for 5 days.  #3 acute on chronic systolic heart failure-he was admitted with shortness of breath.  His last echo from February 2022 with ejection fraction 25 to 25%. Exam with trace bilateral pitting edema, decreased breath sounds at the bases chest x-rays with bibasilar atelectasis. BNP 1072 Continue Lasix 40 mg daily. Troponin peaked at 84. Troponin mildly elevated likely from demand ischemia Continue Lasix Entresto and Aldactone CT chest-mild scarring in bases bilaterally with stable  cardiomegaly no acute abnormality. Urine drug screen negative  #4 history of aortic valve replacement for aortic stenosis on Coumadin  #5 type 2 diabetes complicated with neuropathy with an A1c of 7.1  Continue Jardiance  #6 stage IIIa CKD at baseline  #7 history of essential hypertension on Lasix 40 mg daily, hydralazine 25 mg 3 times a day, Imdur 60 mg daily, Aldactone 25 mg daily.  His blood pressure is running soft I have adjusted the dose of hydralazine and Imdur.  Estimated body mass index is 36.9 kg/m as calculated from the following:   Height as of this encounter: 5\' 11"  (1.803 m).   Weight as of this encounter: 120 kg.  DVT prophylaxis:coumadin Code Status: full Family Communication: His brother was on the phone while I was in the room with the patient Disposition Plan:  Status is: Inpatient  Dispo: The patient is from: Home              Anticipated d/c is to: Home              Patient currently is not medically stable to d/c.   Difficult to place patient No    Consultants:   None  Procedures: None Antimicrobials none  Subjective: He continues to complain of bilateral foot pain He feels he is not ready to go home and he wants to stay another day to make sure he can walk okay.  I did explain to him all the work-up has been negative for acute findings. Objective: Vitals:   05/07/21 0043  05/07/21 0448 05/07/21 0456 05/07/21 0901  BP:  94/75  101/76  Pulse:    66  Resp: 16  18 20   Temp:  98.5 F (36.9 C)  98 F (36.7 C)  TempSrc:  Oral  Oral  SpO2:    98%  Weight:      Height:        Intake/Output Summary (Last 24 hours) at 05/07/2021 1351 Last data filed at 05/07/2021 1300 Gross per 24 hour  Intake 1140 ml  Output 950 ml  Net 190 ml   Filed Weights   05/06/21 0137 05/06/21 1610  Weight: 115.7 kg 120 kg    Examination:  General exam: Appears calm and comfortable  Respiratory system: Scattered rhonchi to auscultation. Respiratory effort  normal. Cardiovascular system: S1 & S2 heard, RRR. No JVD, murmurs, rubs, gallops or clicks. No pedal edema. Gastrointestinal system: Abdomen is nondistended, soft and nontender. No organomegaly or masses felt. Normal bowel sounds heard. Central nervous system: Alert and oriented. No focal neurological deficits. Extremities: Trace bilateral pitting edema Skin: No rashes, lesions or ulcers Psychiatry: Judgement and insight appear normal. Mood & affect appropriate.     Data Reviewed: I have personally reviewed following labs and imaging studies  CBC: Recent Labs  Lab 05/02/21 2033 05/05/21 1600 05/06/21 1321  WBC 11.3* 12.6* 11.3*  NEUTROABS  --  10.2*  --   HGB 15.1 14.2 14.1  HCT 47.0 44.9 42.7  MCV 86.1 87.5 86.6  PLT 249 260 [id number]   Basic Metabolic Panel: Recent Labs  Lab 05/02/21 2033 05/05/21 1600 05/06/21 1321  NA 135 134* 133*  K 4.2 4.8 4.8  CL 103 100 99  CO2 24 24 24   GLUCOSE 126* 116* 131*  BUN 19 23* 28*  CREATININE 1.70* 1.66* 1.69*  CALCIUM 9.3 9.2 8.8*   GFR: Estimated Creatinine Clearance: 61.3 mL/min (A) (by C-G formula based on SCr of 1.69 mg/dL (H)). Liver Function Tests: Recent Labs  Lab 05/05/21 1600 05/06/21 1321  AST 25 23  ALT 21 20  ALKPHOS 93 91  BILITOT 2.1* 1.4*  PROT 7.5 7.0  ALBUMIN 3.3* 3.0*   No results for input(s): LIPASE, AMYLASE in the last 168 hours. No results for input(s): AMMONIA in the last 168 hours. Coagulation Profile: Recent Labs  Lab 05/03/21 0343 05/05/21 2023 05/06/21 1340 05/07/21 0228  INR 1.8* 2.5* 3.0* 3.1*   Cardiac Enzymes: Recent Labs  Lab 05/02/21 2033  CKTOTAL 206   BNP (last 3 results) No results for input(s): PROBNP in the last 8760 hours. HbA1C: Recent Labs    05/06/21 0626  HGBA1C 7.1*   CBG: Recent Labs  Lab 05/06/21 1146 05/06/21 1612 05/06/21 2025  GLUCAP 108* 123* 118*   Lipid Profile: No results for input(s): CHOL, HDL, LDLCALC, TRIG, CHOLHDL, LDLDIRECT in the last 72  hours. Thyroid Function Tests: No results for input(s): TSH, T4TOTAL, FREET4, T3FREE, THYROIDAB in the last 72 hours. Anemia Panel: No results for input(s): VITAMINB12, FOLATE, FERRITIN, TIBC, IRON, RETICCTPCT in the last 72 hours. Sepsis Labs: Recent Labs  Lab 05/03/21 0321 05/05/21 2023  LATICACIDVEN 1.0 1.4    Recent Results (from the past 240 hour(s))  Resp Panel by RT-PCR (Flu A&B, Covid) Nasopharyngeal Swab     Status: None   Collection Time: 05/03/21  3:47 AM   Specimen: Nasopharyngeal Swab; Nasopharyngeal(NP) swabs in vial transport medium  Result Value Ref Range Status   SARS Coronavirus 2 by RT PCR NEGATIVE NEGATIVE Final  Comment: (NOTE) SARS-CoV-2 target nucleic acids are NOT DETECTED.  The SARS-CoV-2 RNA is generally detectable in upper respiratory specimens during the acute phase of infection. The lowest concentration of SARS-CoV-2 viral copies this assay can detect is 138 copies/mL. A negative result does not preclude SARS-Cov-2 infection and should not be used as the sole basis for treatment or other patient management decisions. A negative result may occur with  improper specimen collection/handling, submission of specimen other than nasopharyngeal swab, presence of viral mutation(s) within the areas targeted by this assay, and inadequate number of viral copies(<138 copies/mL). A negative result must be combined with clinical observations, patient history, and epidemiological information. The expected result is Negative.  Fact Sheet for Patients:  EntrepreneurPulse.com.au  Fact Sheet for Healthcare Providers:  IncredibleEmployment.be  This test is no t yet approved or cleared by the [place] FDA and  has been authorized for detection and/or diagnosis of SARS-CoV-2 by FDA under an Emergency Use Authorization (EUA). This EUA will remain  in effect (meaning this test can be used) for the duration of the COVID-19  declaration under Section 564(b)(1) of the Act, 21 U.S.C.section 360bbb-3(b)(1), unless the authorization is terminated  or revoked sooner.       Influenza A by PCR NEGATIVE NEGATIVE Final   Influenza B by PCR NEGATIVE NEGATIVE Final    Comment: (NOTE) The Xpert Xpress SARS-CoV-2/FLU/RSV plus assay is intended as an aid in the diagnosis of influenza from Nasopharyngeal swab specimens and should not be used as a sole basis for treatment. Nasal washings and aspirates are unacceptable for Xpert Xpress SARS-CoV-2/FLU/RSV testing.  Fact Sheet for Patients: EntrepreneurPulse.com.au  Fact Sheet for Healthcare Providers: IncredibleEmployment.be  This test is not yet approved or cleared by the [place] FDA and has been authorized for detection and/or diagnosis of SARS-CoV-2 by FDA under an Emergency Use Authorization (EUA). This EUA will remain in effect (meaning this test can be used) for the duration of the COVID-19 declaration under Section 564(b)(1) of the Act, 21 U.S.C. section 360bbb-3(b)(1), unless the authorization is terminated or revoked.  Performed at [facility], [place] [address] [place], [place] [address]   Blood culture (routine x 2)     Status: None (Preliminary result)   Collection Time: 05/03/21  4:15 AM   Specimen: BLOOD  Result Value Ref Range Status   Specimen Description BLOOD SITE NOT SPECIFIED  Final   Special Requests   Final    BOTTLES DRAWN AEROBIC AND ANAEROBIC Blood Culture results may not be optimal due to an inadequate volume of blood received in culture bottles   Culture   Final    NO GROWTH 3 DAYS Performed at [facility], [place] [address], [place], [place] [address]    Report Status PENDING  Incomplete  Blood culture (routine x 2)     Status: None (Preliminary result)   Collection Time: 05/03/21  4:17 AM   Specimen: BLOOD  Result Value Ref Range Status   Specimen Description BLOOD SITE NOT  SPECIFIED  Final   Special Requests   Final    BOTTLES DRAWN AEROBIC AND ANAEROBIC Blood Culture results may not be optimal due to an inadequate volume of blood received in culture bottles   Culture   Final    NO GROWTH 3 DAYS Performed at [facility], [place] [address], [place], [place] [address]    Report Status PENDING  Incomplete  Resp Panel by RT-PCR (Flu A&B, Covid) Nasopharyngeal Swab  Status: None   Collection Time: 05/05/21  8:24 PM   Specimen: Nasopharyngeal Swab; Nasopharyngeal(NP) swabs in vial transport medium  Result Value Ref Range Status   SARS Coronavirus 2 by RT PCR NEGATIVE NEGATIVE Final    Comment: (NOTE) SARS-CoV-2 target nucleic acids are NOT DETECTED.  The SARS-CoV-2 RNA is generally detectable in upper respiratory specimens during the acute phase of infection. The lowest concentration of SARS-CoV-2 viral copies this assay can detect is 138 copies/mL. A negative result does not preclude SARS-Cov-2 infection and should not be used as the sole basis for treatment or other patient management decisions. A negative result may occur with  improper specimen collection/handling, submission of specimen other than nasopharyngeal swab, presence of viral mutation(s) within the areas targeted by this assay, and inadequate number of viral copies(<138 copies/mL). A negative result must be combined with clinical observations, patient history, and epidemiological information. The expected result is Negative.  Fact Sheet for Patients:  EntrepreneurPulse.com.au  Fact Sheet for Healthcare Providers:  IncredibleEmployment.be  This test is no t yet approved or cleared by the [place] FDA and  has been authorized for detection and/or diagnosis of SARS-CoV-2 by FDA under an Emergency Use Authorization (EUA). This EUA will remain  in effect (meaning this test can be used) for the duration of the COVID-19 declaration under Section  564(b)(1) of the Act, 21 U.S.C.section 360bbb-3(b)(1), unless the authorization is terminated  or revoked sooner.       Influenza A by PCR NEGATIVE NEGATIVE Final   Influenza B by PCR NEGATIVE NEGATIVE Final    Comment: (NOTE) The Xpert Xpress SARS-CoV-2/FLU/RSV plus assay is intended as an aid in the diagnosis of influenza from Nasopharyngeal swab specimens and should not be used as a sole basis for treatment. Nasal washings and aspirates are unacceptable for Xpert Xpress SARS-CoV-2/FLU/RSV testing.  Fact Sheet for Patients: EntrepreneurPulse.com.au  Fact Sheet for Healthcare Providers: IncredibleEmployment.be  This test is not yet approved or cleared by the [place] FDA and has been authorized for detection and/or diagnosis of SARS-CoV-2 by FDA under an Emergency Use Authorization (EUA). This EUA will remain in effect (meaning this test can be used) for the duration of the COVID-19 declaration under Section 564(b)(1) of the Act, 21 U.S.C. section 360bbb-3(b)(1), unless the authorization is terminated or revoked.  Performed at [facility], [place] [address], [place], [place] [address]   Urine culture     Status: None   Collection Time: 05/05/21 10:36 PM   Specimen: Urine, Random  Result Value Ref Range Status   Specimen Description URINE, RANDOM  Final   Special Requests NONE  Final   Culture   Final    NO GROWTH Performed at [facility], [place] [address], [place], [place] [address]    Report Status 05/07/2021 FINAL  Final  MRSA PCR Screening     Status: None   Collection Time: 05/06/21  8:01 PM   Specimen: Nasopharyngeal  Result Value Ref Range Status   MRSA by PCR NEGATIVE NEGATIVE Final    Comment:        The GeneXpert MRSA Assay (FDA approved for NASAL specimens only), is one component of a comprehensive MRSA colonization surveillance program. It is not intended to diagnose MRSA infection nor to guide  or monitor treatment for MRSA infections. Performed at [facility], [place] [address] [place], [place] [address]          Radiology Studies: DG Chest 1 View  Result Date: 05/05/2021 CLINICAL DATA:  Chest pain. EXAM: CHEST  1 VIEW COMPARISON:  May 02, 2021. FINDINGS: Stable cardiomegaly. Sternotomy wires are noted. No pneumothorax or pleural effusion is noted. Both lungs are clear. The visualized skeletal structures are unremarkable. IMPRESSION: No active disease. Electronically Signed   By: Marijo Conception M.D.   On: 05/05/2021 16:22   DG Ankle Complete Left  Result Date: 05/05/2021 CLINICAL DATA:  Ankle pain and fevers, initial encounter EXAM: LEFT ANKLE COMPLETE - 3+ VIEW COMPARISON:  None. FINDINGS: There is no evidence of fracture, dislocation, or joint effusion. Mild tarsal degenerative changes are noted. No acute soft tissue abnormality is seen. IMPRESSION: No acute abnormality noted. Electronically Signed   By: Inez Catalina M.D.   On: 05/05/2021 22:03   DG Ankle Complete Right  Result Date: 05/05/2021 CLINICAL DATA:  Ankle pain and fevers, initial encounter EXAM: RIGHT ANKLE - COMPLETE 3+ VIEW COMPARISON:  None. FINDINGS: Calcaneal spurring is noted. No significant soft tissue abnormality is noted. Tiny bony density is noted adjacent to the distal fibula likely related to prior trauma and nonunion. No new focal abnormality is seen. IMPRESSION: Chronic changes without acute abnormality. Electronically Signed   By: Inez Catalina M.D.   On: 05/05/2021 22:02   CT Chest Wo Contrast  Result Date: 05/05/2021 CLINICAL DATA:  Persistent cough EXAM: CT CHEST WITHOUT CONTRAST TECHNIQUE: Multidetector CT imaging of the chest was performed following the standard protocol without IV contrast. COMPARISON:  Chest x-ray from earlier in the same day, CT from 04/16/2020. FINDINGS: Cardiovascular: Somewhat limited due to lack of IV contrast. Aortic calcifications are noted without aneurysmal  dilatation. Heart is enlarged in size similar to that seen on prior chest x-ray. Aortic valve calcifications are noted. Mediastinum/Nodes: Thoracic inlet demonstrates some scattered calcifications within the thyroid although no discrete nodule is seen. No sizable hilar or mediastinal adenopathy is noted. The esophagus is within normal limits. Lungs/Pleura: Mild scarring is noted in the bases bilaterally. No sizable effusion is seen. No focal infiltrate or sizable parenchymal nodule is noted. Upper Abdomen: Visualized upper abdomen shows no acute abnormality. Musculoskeletal: Degenerative changes of the thoracic spine are noted. IMPRESSION: Mild scarring in the bases bilaterally. Stable cardiomegaly. No acute abnormality is noted. Aortic Atherosclerosis (ICD10-I70.0). Electronically Signed   By: Inez Catalina M.D.   On: 05/05/2021 21:42   DG Foot Complete Left  Result Date: 05/05/2021 CLINICAL DATA:  Foot pain and fevers, initial encounter EXAM: LEFT FOOT - COMPLETE 3+ VIEW COMPARISON:  None. FINDINGS: Mild tarsal degenerative changes are noted. No acute fracture or dislocation is noted. No soft tissue abnormality is seen. IMPRESSION: Negative. Electronically Signed   By: Inez Catalina M.D.   On: 05/05/2021 22:03   DG Foot Complete Right  Result Date: 05/05/2021 CLINICAL DATA:  Foot pain and fevers, initial encounter EXAM: RIGHT FOOT COMPLETE - 3+ VIEW COMPARISON:  None. FINDINGS: Calcaneal spurring is noted. Mild tarsal degenerative changes are seen. No acute fracture or dislocation is noted. No soft tissue abnormality is seen. IMPRESSION: No acute abnormality noted. Electronically Signed   By: Inez Catalina M.D.   On: 05/05/2021 22:04   VAS [place] LOWER EXTREMITY VENOUS (DVT)  Result Date: 05/06/2021  Lower Venous DVT Study Patient Name:  AMRO DISCALA  Date of Exam:   05/06/2021 Medical Rec #: [id number]          Accession #:    [id number] Date of Birth: May 03, 1961  Patient Gender: M Patient Age:    42Y Exam Location:  [facility] Procedure:      VAS [place] LOWER EXTREMITY VENOUS (DVT) Referring Phys: [id number] Letroy Vazguez G Vernesha Talbot --------------------------------------------------------------------------------  Indications: Edema.  Comparison Study: 05-11-2019 Bilateral lower extremity venous study was negative                   for DVT. Performing Technologist: Abram Sander RVS  Examination Guidelines: A complete evaluation includes B-mode imaging, spectral Doppler, color Doppler, and power Doppler as needed of all accessible portions of each vessel. Bilateral testing is considered an integral part of a complete examination. Limited examinations for reoccurring indications may be performed as noted. The reflux portion of the exam is performed with the patient in reverse Trendelenburg.  +---------+---------------+---------+-----------+----------+--------------+ RIGHT    CompressibilityPhasicitySpontaneityPropertiesThrombus Aging +---------+---------------+---------+-----------+----------+--------------+ CFV      Full           Yes      Yes                                 +---------+---------------+---------+-----------+----------+--------------+ SFJ      Full                                                        +---------+---------------+---------+-----------+----------+--------------+ FV Prox  Full                                                        +---------+---------------+---------+-----------+----------+--------------+ FV Mid   Full                                                        +---------+---------------+---------+-----------+----------+--------------+ FV DistalFull                                                        +---------+---------------+---------+-----------+----------+--------------+ PFV      Full                                                        +---------+---------------+---------+-----------+----------+--------------+  POP      Full           Yes      Yes                                 +---------+---------------+---------+-----------+----------+--------------+ PTV      Full                                                        +---------+---------------+---------+-----------+----------+--------------+  PERO     Full                                                        +---------+---------------+---------+-----------+----------+--------------+   +---------+---------------+---------+-----------+----------+--------------+ LEFT     CompressibilityPhasicitySpontaneityPropertiesThrombus Aging +---------+---------------+---------+-----------+----------+--------------+ CFV      Full           Yes      Yes                                 +---------+---------------+---------+-----------+----------+--------------+ SFJ      Full                                                        +---------+---------------+---------+-----------+----------+--------------+ FV Prox  Full                                                        +---------+---------------+---------+-----------+----------+--------------+ FV Mid   Full                                                        +---------+---------------+---------+-----------+----------+--------------+ FV DistalFull                                                        +---------+---------------+---------+-----------+----------+--------------+ PFV      Full                                                        +---------+---------------+---------+-----------+----------+--------------+ POP      Full           Yes      Yes                                 +---------+---------------+---------+-----------+----------+--------------+ PTV      Full                                                        +---------+---------------+---------+-----------+----------+--------------+ PERO     Full                                                         +---------+---------------+---------+-----------+----------+--------------+  Summary: RIGHT: - There is no evidence of deep vein thrombosis in the lower extremity.  - No cystic structure found in the popliteal fossa.  LEFT: - There is no evidence of deep vein thrombosis in the lower extremity.  - No cystic structure found in the popliteal fossa.  *See table(s) above for measurements and observations. Electronically signed by Deitra Mayo MD on 05/06/2021 at 8:15:33 PM.    Final         Scheduled Meds: . azithromycin  500 mg Oral Daily  . empagliflozin  10 mg Oral QAC breakfast  . furosemide  40 mg Oral Daily  . gabapentin  100 mg Oral TID  . hydrALAZINE  25 mg Oral TID  . isosorbide mononitrate  60 mg Oral Daily  . sacubitril-valsartan  1 tablet Oral BID  . spironolactone  25 mg Oral Daily  . Warfarin - Pharmacist Dosing Inpatient   Does not apply q1600   Continuous Infusions:   LOS: 1 day    Georgette Shell, MD 05/07/2021, 1:51 PM

## 2021-05-07 NOTE — Progress Notes (Signed)
Echocardiogram with defiantly and 3D is complete.  [name]

## 2021-05-07 NOTE — Plan of Care (Signed)
  Problem: Clinical Measurements: Goal: Ability to maintain clinical measurements within normal limits will improve Outcome: Progressing   

## 2021-05-08 ENCOUNTER — Observation Stay (HOSPITAL_COMMUNITY): Payer: Medicare (Managed Care)

## 2021-05-08 LAB — CULTURE, BLOOD (ROUTINE X 2)
Culture: NO GROWTH
Culture: NO GROWTH

## 2021-05-08 LAB — PROTIME-INR
INR: 2.7 — ABNORMAL HIGH (ref 0.8–1.2)
Prothrombin Time: 28.4 seconds — ABNORMAL HIGH (ref 11.4–15.2)

## 2021-05-08 LAB — GLUCOSE, CAPILLARY
Glucose-Capillary: 113 mg/dL — ABNORMAL HIGH (ref 70–99)
Glucose-Capillary: 101 mg/dL — ABNORMAL HIGH (ref 70–99)

## 2021-05-08 MED ORDER — WARFARIN SODIUM 7.5 MG PO TABS
7.5000 mg | ORAL_TABLET | ORAL | Status: DC
  Administered 2021-05-08 – 2021-05-12 (×3): 7.5 mg via ORAL
  Filled 2021-05-08 (×3): qty 1

## 2021-05-08 MED ORDER — BISACODYL 10 MG RE SUPP
10.0000 mg | Freq: Once | RECTAL | Status: AC
  Administered 2021-05-08: 10 mg via RECTAL
  Filled 2021-05-08: qty 1

## 2021-05-08 MED ORDER — WARFARIN SODIUM 10 MG PO TABS
10.0000 mg | ORAL_TABLET | ORAL | Status: DC
  Administered 2021-05-09 – 2021-05-13 (×3): 10 mg via ORAL
  Filled 2021-05-08 (×5): qty 1

## 2021-05-08 MED ORDER — GUAIFENESIN 100 MG/5ML PO SOLN
5.0000 mL | ORAL | Status: DC | PRN
  Administered 2021-05-08 – 2021-05-09 (×3): 100 mg via ORAL
  Filled 2021-05-08 (×3): qty 5

## 2021-05-08 MED ORDER — PREDNISONE 20 MG PO TABS
20.0000 mg | ORAL_TABLET | Freq: Every day | ORAL | Status: DC
  Administered 2021-05-09 – 2021-05-13 (×5): 20 mg via ORAL
  Filled 2021-05-08 (×5): qty 1

## 2021-05-08 MED ORDER — COLCHICINE 0.6 MG PO TABS
0.6000 mg | ORAL_TABLET | Freq: Four times a day (QID) | ORAL | Status: AC
  Administered 2021-05-08 (×2): 0.6 mg via ORAL
  Filled 2021-05-08 (×2): qty 1

## 2021-05-08 MED ORDER — COLCHICINE 0.6 MG PO TABS: 0.6000 mg | ORAL_TABLET | Freq: Once | ORAL | Status: DC

## 2021-05-08 NOTE — Progress Notes (Signed)
ANTICOAGULATION CONSULT NOTE - Follow Up Consult  Pharmacy Consult for Coumadin Indication: hx aflutter, LV thrombus, bioprosthetic AVR  No Known Allergies  Patient Measurements: Height: 5\' 11"  (180.3 cm) Weight: 120 kg (264 lb 8.8 oz) IBW/kg (Calculated) : 75.3  Vital Signs: Temp: 98.6 F (37 C) (05/22 0532) Temp Source: Oral (05/22 0532) BP: 110/86 (05/22 0532) Pulse Rate: 56 (05/22 0532)  Labs: Recent Labs    05/05/21 1600 05/05/21 1935 05/05/21 2023 05/06/21 1321 05/06/21 1340 05/06/21 1630 05/07/21 0228 05/08/21 0148  HGB 14.2  --   --  14.1  --   --   --   --   HCT 44.9  --   --  42.7  --   --   --   --   PLT 260  --   --  285  --   --   --   --   LABPROT  --   --    < >  --  31.1*  --  31.7* 28.4*  INR  --   --    < >  --  3.0*  --  3.1* 2.7*  CREATININE 1.66*  --   --  1.69*  --   --   --   --   TROPONINIHS 79* 84*  --  55*  --  62*  --   --    < > = values in this interval not displayed.    Estimated Creatinine Clearance: 61.3 mL/min (A) (by C-G formula based on SCr of 1.69 mg/dL (H)).   Assessment: Anticoag: Warfarin pta for hx aflutter, LV thrombus, bioprosthetic AVR. INR 3.1>2.7. Dopplers negative for new DVT.  -PTA dose Coumadin10 mg MWF, 7.5 mg TTSS with admit INR 2.5  Goal of Therapy:  INR 2-3 Monitor platelets by anticoagulation protocol: Yes   Plan:  Resume home Coumadin regimen Daily INR.   Maddix Heinz S. Alford Highland, PharmD, BCPS Clinical Staff Pharmacist [email or web address] Alford Highland, Tico Crotteau Stillinger 05/08/2021,8:28 AM

## 2021-05-08 NOTE — Plan of Care (Signed)
  Problem: Elimination: Goal: Will not experience complications related to bowel motility Outcome: Progressing   

## 2021-05-08 NOTE — TOC Initial Note (Signed)
Transition of Care [facility]) - Initial/Assessment Note    Patient Details  Name: [name] MRN: [id number] Date of Birth: [birth year]  Transition of Care [facility]) CM/SW Contact:    Pollie Friar, RN Phone Number: 05/08/2021, 11:35 AM  Clinical Narrative:                 Recommendations are for [facility] services but pt only ambulated 10 feet with 2+ assist. CM met with the patient and he states he lives with a friend that works and he will be alone at the apartment. He is asking for SNF rehab prior to d/c home. CM updated him that not sure which SNF are in network with his insurance. He voices understanding and wishes to be faxed out in the [facility] area.  If patient progress well and is able to d/c home he doesn't want the wheelchair, he only asked for the walker and 3 in 1.  CM will attempt to provide him bed offers this pm.  TOC following.  Expected Discharge Plan: Skilled Nursing Facility Barriers to Discharge: Continued Medical Work up   Patient Goals and CMS Choice   CMS Medicare.gov Compare Post Acute Care list provided to:: Patient Choice offered to / list presented to : Patient  Expected Discharge Plan and Services Expected Discharge Plan: [place] In-house Referral: Clinical Social Work Discharge Planning Services: CM Consult Post Acute Care Choice: [place] Living arrangements for the past 2 months: Apartment Expected Discharge Date: 05/07/21                                    Prior Living Arrangements/Services Living arrangements for the past 2 months: Apartment Lives with:: Roommate Patient language and need for interpreter reviewed:: Yes Do you feel safe going back to the place where you live?: No   pt states he is alone when roommate at work and he is not ambulating well  Need for Family Participation in Patient Care: Yes (Comment) Care giver support system in place?: No (comment)   Criminal Activity/Legal Involvement  Pertinent to Current Situation/Hospitalization: No - Comment as needed  Activities of Daily Living Home Assistive Devices/Equipment: None ADL Screening (condition at time of admission) Patient's cognitive ability adequate to safely complete daily activities?: Yes Is the patient deaf or have difficulty hearing?: No Does the patient have difficulty seeing, even when wearing glasses/contacts?: No Does the patient have difficulty concentrating, remembering, or making decisions?: No Patient able to express need for assistance with ADLs?: Yes Does the patient have difficulty dressing or bathing?: No Independently performs ADLs?: Yes (appropriate for developmental age) Communication: Independent Dressing (OT): Independent Grooming: Independent Feeding: Independent Bathing: Independent Toileting: Independent In/Out Bed: Independent Walks in Home: Independent Does the patient have difficulty walking or climbing stairs?: No Weakness of Legs: None Weakness of Arms/Hands: None  Permission Sought/Granted                  Emotional Assessment Appearance:: Appears stated age Attitude/Demeanor/Rapport: Engaged Affect (typically observed): Accepting Orientation: : Oriented to Self,Oriented to Place,Oriented to  Time,Oriented to Situation   Psych Involvement: No (comment)  Admission diagnosis:  Fever of unknown origin [R50.9] Fever [R50.9] Chest pain [R07.9] Patient Active Problem List   Diagnosis Date Noted  . Fever of unknown origin 05/06/2021  . Fever 05/06/2021  . Abdominal distention 04/06/2020  . Generalized abdominal pain 04/06/2020  . Change in  bowel habits 04/06/2020  . Hx of colonic polyps 11/11/2019  . Anemia, iron deficiency 11/05/2019  . LV (left ventricular) mural thrombus without MI ([place]) 06/19/2019  . Internal and external bleeding hemorrhoids   . S/P AVR 04/05/2015  . Aortic stenosis 03/29/2015  . Bicuspid aortic valve 03/29/2015  . Acute systolic CHF  (congestive heart failure) ([place]) 03/27/2015  . Aortic insufficiency 11/19/2014  . Congestive dilated cardiomyopathy ([place]) 11/19/2014  . Essential hypertension 11/19/2014  . 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 Chronic systolic CHF (congestive heart failure) ([place]) 11/19/2014  . SOB (shortness of breath) 11/19/2014  . Atrial flutter ([place]) 11/19/2014   PCP:  Flossie Buffy, NP Pharmacy:   [facility] [place], [place] [address] Phone: [phone] Fax: [place] [address] [place] [address] Phone: [phone] Fax: [phone]  [facility] [place], [place] - [address] [place] [address] Phone: [phone] Fax: [phone]  [place], [place] [address] [place] [address] Phone: [phone] Fax: [phone]     Social Determinants of Health ([place]) Interventions    Readmission Risk Interventions No flowsheet data found.

## 2021-05-08 NOTE — NC FL2 (Signed)
[place] LEVEL OF CARE SCREENING TOOL     IDENTIFICATION  Patient Name: [name] Birthdate: 1960/12/27 Sex: male Admission Date (Current Location): 05/05/2021  [facility] and [place] Number:  Herbalist and Address:  [facility], [place] [address] [place], [place] [address]      Provider Number: [id number]  Attending Physician Name and Address:  Georgette Shell, MD  Relative Name and Phone Number:       Current Level of Care: Hospital Recommended Level of Care: [place] Prior Approval Number:    Date Approved/Denied:   PASRR Number: [id number]  Discharge Plan: SNF    Current Diagnoses: Patient Active Problem List   Diagnosis Date Noted  . Fever of unknown origin 05/06/2021  . Fever 05/06/2021  . Abdominal distention 04/06/2020  . Generalized abdominal pain 04/06/2020  . Change in bowel habits 04/06/2020  . Hx of colonic polyps 11/11/2019  . Anemia, iron deficiency 11/05/2019  . LV (left ventricular) mural thrombus without MI ([place]) 06/19/2019  . Internal and external bleeding hemorrhoids   . S/P AVR 04/05/2015  . Aortic stenosis 03/29/2015  . Bicuspid aortic valve 03/29/2015  . Acute systolic CHF (congestive heart failure) ([place]) 03/27/2015  . Aortic insufficiency 11/19/2014  . Congestive dilated cardiomyopathy ([place]) 11/19/2014  . Essential hypertension 11/19/2014  . 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 Chronic systolic CHF (congestive heart failure) ([place]) 11/19/2014  . SOB (shortness of breath) 11/19/2014  . Atrial flutter ([place]) 11/19/2014    Orientation RESPIRATION BLADDER Height & Weight     Self,Time,Situation,Place  Normal Continent Weight: 264 lb 8.8 oz (120 kg) Height:  5\' 11"  (180.3 cm)  BEHAVIORAL SYMPTOMS/MOOD NEUROLOGICAL BOWEL NUTRITION STATUS      Continent Diet (heart healthy/carb modified)  AMBULATORY STATUS COMMUNICATION OF NEEDS Skin   Extensive  Assist Verbally Normal                       Personal Care Assistance Level of Assistance  Bathing,Feeding,Dressing Bathing Assistance: Limited assistance Feeding assistance: Independent Dressing Assistance: Limited assistance     Functional Limitations Info             SPECIAL CARE FACTORS FREQUENCY  PT (By licensed PT),OT (By licensed OT)     PT Frequency: 5x/wk OT Frequency: 5x/wk            Contractures Contractures Info: Not present    Additional Factors Info  Code Status,Allergies Code Status Info: Full Allergies Info: NKA           Current Medications (05/08/2021):  This is the current hospital active medication list Current Facility-Administered Medications  Medication Dose Route Frequency Provider Last Rate Last Admin  . acetaminophen (TYLENOL) tablet 650 mg  650 mg Oral Q6H PRN Nicolette Bang, DO   650 mg at 05/08/21 1751   Or  . acetaminophen (TYLENOL) suppository 650 mg  650 mg Rectal Q6H PRN Nicolette Bang, DO      . azithromycin [facility]) tablet 500 mg  500 mg Oral Daily Georgette Shell, MD   500 mg at 05/07/21 1455  . bisacodyl (DULCOLAX) suppository 10 mg  10 mg Rectal Once Georgette Shell, MD      . empagliflozin (JARDIANCE) tablet 10 mg  10 mg Oral QAC breakfast Nicolette Bang, DO   10 mg at 05/08/21 0258  . furosemide (LASIX) tablet 40 mg  40 mg  Oral Daily Nicolette Bang, DO   40 mg at 05/07/21 1123  . gabapentin (NEURONTIN) capsule 200 mg  200 mg Oral TID Georgette Shell, MD   200 mg at 05/07/21 2116  . guaiFENesin (ROBITUSSIN) 100 MG/5ML solution 100 mg  5 mL Oral Q4H PRN Nicolette Bang, DO   100 mg at 05/08/21 0656  . hydrALAZINE (APRESOLINE) tablet 10 mg  10 mg Oral Q8H Georgette Shell, MD   10 mg at 05/08/21 0556  . isosorbide mononitrate (IMDUR) 24 hr tablet 30 mg  30 mg Oral Daily Georgette Shell, MD      . sacubitril-valsartan [facility]) 97-103 mg per  tablet  1 tablet Oral BID Nicolette Bang, DO   1 tablet at 05/07/21 2117  . spironolactone (ALDACTONE) tablet 25 mg  25 mg Oral Daily Nicolette Bang, DO   25 mg at 05/07/21 1123  . [START ON 05/09/2021] warfarin (COUMADIN) tablet 10 mg  10 mg Oral Once per day on Mon Wed Fri Karren Cobble, RPH      . warfarin (COUMADIN) tablet 7.5 mg  7.5 mg Oral Once per day on Sun Tue Thu Sat Karren Cobble,       . Warfarin - Pharmacist Dosing Inpatient   Does not apply q1600 Nicolette Bang, DO         Discharge Medications: Please see discharge summary for a list of discharge medications.  Relevant Imaging Results:  Relevant Lab Results:   Additional Information SS#: [id number]  Geralynn Ochs, LCSW

## 2021-05-08 NOTE — Progress Notes (Signed)
Patient called for Tylenol for for his left knee and right wrist. On assess, those two extremities are very warm to touch compared to the the opposite. Will notified the provider and continue to monitor.

## 2021-05-08 NOTE — Progress Notes (Signed)
PROGRESS NOTE    [name]  [id number] DOB: 1961/01/28 DOA: 05/05/2021 PCP: Flossie Buffy, NP   Brief Narrative: 60 year old male diabetes AVR on Coumadin history of hypertension and congestive heart failure admitted with complaints of bilateral foot pain and swelling.  He also reports a productive cough. He denies chest pain does report shortness of breath.  Assessment & Plan:   Active Problems:   Congestive dilated cardiomyopathy (HCC)   Essential hypertension   CKD (chronic kidney disease), stage II-III   S/P AVR   Fever of unknown origin   Fever  #1 bilateral foot pain -likely multifactorial secondary to gout, neuropathy, musculoskeletal pain -work-up includes x-rays of both feet and leg and venous Doppler which does not reveal anything acute.  No fracture seen he does have some chronic changes heel spurs.  His pain is likely due to neuropathy from longstanding type 2 diabetes.  He thinks he probably have had gout in the past currently not on any medications He continues with bilateral feet pain with elevated uric acid trending up to 10.2.  I will start him on colchicine. CRP and sed rate is elevated. Increase Neurontin to 200 mg 3 times a day. He reports severe pain right wrist,check xrays of wrist PT note reports he walked 10 feet with 2 assist.  Patient lives alone and he will not be able to care for himself.  He will and he wants to go to a rehab prior to discharge home so that that he can take care of himself.  #2 fever likely from bronchitis. when patient visited the ED on 05/02/2021 he had a temperature of 101.5.  He has been afebrile today and his T-max is 100.  Cultures have been negative so far.  Mild leukocytosis at 12.6.  No evidence of active acute infection at this time noted. Patient is bringing up green phlegm with wheezing and shortness of breath. Zithromax for 5 days.  #3 acute on chronic systolic heart failure-he was admitted with shortness of  breath.  His last echo from February 2022 with ejection fraction 25 to 25%. Exam with trace bilateral pitting edema, decreased breath sounds at the bases chest x-rays with bibasilar atelectasis. BNP 1072 Continue Lasix 40 mg daily. Troponin peaked at 84. Troponin mildly elevated likely from demand ischemia Continue Lasix Entresto and Aldactone CT chest-mild scarring in bases bilaterally with stable cardiomegaly no acute abnormality. Urine drug screen negative  #4 history of aortic valve replacement for aortic stenosis on Coumadin  #5 type 2 diabetes complicated with neuropathy with an A1c of 7.1  Continue Jardiance  #6 stage IIIa CKD at baseline  #7 history of essential hypertension on Lasix 40 mg daily, hydralazine 25 mg 3 times a day, Imdur 60 mg daily, Aldactone 25 mg daily.  His blood pressure is running soft I have adjusted the dose of hydralazine and Imdur.  Estimated body mass index is 36.9 kg/m as calculated from the following:   Height as of this encounter: 5\' 11"  (1.803 m).   Weight as of this encounter: 120 kg.  DVT prophylaxis:coumadin Code Status: full Family Communication: His brother was on the phone while I was in the room with the patient Disposition Plan:  Status is: Inpatient  Dispo: The patient is from: Home              Anticipated d/c is to: Home              Patient currently is not medically stable  to d/c.   Difficult to place patient No    Consultants:   None  Procedures: None Antimicrobials none  Subjective: He continues to complain of bilateral foot pain He continues to complain of bilateral feet pain he does not feel he is ready to go home with this pain and that he would turn around and just come back to the hospital.  He walked only 10 feet with 2 people holding him yesterday with PT. Objective: Vitals:   05/07/21 1652 05/07/21 2030 05/08/21 0532 05/08/21 0904  BP: 114/77 104/70 110/86 94/70  Pulse: 100 66 (!) 56 67  Resp: 18 16 16 18    Temp: 99.6 F (37.6 C) 100.1 F (37.8 C) 98.6 F (37 C) 99.1 F (37.3 C)  TempSrc: Oral Oral Oral Oral  SpO2: 96% 97% 95% 96%  Weight:      Height:        Intake/Output Summary (Last 24 hours) at 05/08/2021 1245 Last data filed at 05/08/2021 0900 Gross per 24 hour  Intake 480 ml  Output 1775 ml  Net -1295 ml   Filed Weights   05/06/21 0137 05/06/21 1610  Weight: 115.7 kg 120 kg    Examination:  General exam: Appears calm and comfortable  Respiratory system: Scattered rhonchi to auscultation. Respiratory effort normal. Cardiovascular system: S1 & S2 heard, RRR. No JVD, murmurs, rubs, gallops or clicks. No pedal edema. Gastrointestinal system: Abdomen is nondistended, soft and nontender. No organomegaly or masses felt. Normal bowel sounds heard. Central nervous system: Alert and oriented. No focal neurological deficits. Extremities: Trace bilateral pitting edema, tender to touch both feet Skin: No rashes, lesions or ulcers Psychiatry: Judgement and insight appear normal. Mood & affect appropriate.     Data Reviewed: I have personally reviewed following labs and imaging studies  CBC: Recent Labs  Lab 05/02/21 2033 05/05/21 1600 05/06/21 1321  WBC 11.3* 12.6* 11.3*  NEUTROABS  --  10.2*  --   HGB 15.1 14.2 14.1  HCT 47.0 44.9 42.7  MCV 86.1 87.5 86.6  PLT 249 260 518   Basic Metabolic Panel: Recent Labs  Lab 05/02/21 2033 05/05/21 1600 05/06/21 1321  NA 135 134* 133*  K 4.2 4.8 4.8  CL 103 100 99  CO2 24 24 24   GLUCOSE 126* 116* 131*  BUN 19 23* 28*  CREATININE 1.70* 1.66* 1.69*  CALCIUM 9.3 9.2 8.8*   GFR: Estimated Creatinine Clearance: 61.3 mL/min (A) (by C-G formula based on SCr of 1.69 mg/dL (H)). Liver Function Tests: Recent Labs  Lab 05/05/21 1600 05/06/21 1321  AST 25 23  ALT 21 20  ALKPHOS 93 91  BILITOT 2.1* 1.4*  PROT 7.5 7.0  ALBUMIN 3.3* 3.0*   No results for input(s): LIPASE, AMYLASE in the last 168 hours. No results for  input(s): AMMONIA in the last 168 hours. Coagulation Profile: Recent Labs  Lab 05/03/21 0343 05/05/21 2023 05/06/21 1340 05/07/21 0228 05/08/21 0148  INR 1.8* 2.5* 3.0* 3.1* 2.7*   Cardiac Enzymes: Recent Labs  Lab 05/02/21 2033  CKTOTAL 206   BNP (last 3 results) No results for input(s): PROBNP in the last 8760 hours. HbA1C: Recent Labs    05/06/21 0626  HGBA1C 7.1*   CBG: Recent Labs  Lab 05/06/21 1146 05/06/21 1612 05/06/21 2025 05/07/21 2028 05/08/21 0636  GLUCAP 108* 123* 118* 115* 113*   Lipid Profile: No results for input(s): CHOL, HDL, LDLCALC, TRIG, CHOLHDL, LDLDIRECT in the last 72 hours. Thyroid Function Tests: No results for input(s):  TSH, T4TOTAL, FREET4, T3FREE, THYROIDAB in the last 72 hours. Anemia Panel: No results for input(s): VITAMINB12, FOLATE, FERRITIN, TIBC, IRON, RETICCTPCT in the last 72 hours. Sepsis Labs: Recent Labs  Lab 05/03/21 0321 05/05/21 2023  LATICACIDVEN 1.0 1.4    Recent Results (from the past 240 hour(s))  Resp Panel by RT-PCR (Flu A&B, Covid) Nasopharyngeal Swab     Status: None   Collection Time: 05/03/21  3:47 AM   Specimen: Nasopharyngeal Swab; Nasopharyngeal(NP) swabs in vial transport medium  Result Value Ref Range Status   SARS Coronavirus 2 by RT PCR NEGATIVE NEGATIVE Final    Comment: (NOTE) SARS-CoV-2 target nucleic acids are NOT DETECTED.  The SARS-CoV-2 RNA is generally detectable in upper respiratory specimens during the acute phase of infection. The lowest concentration of SARS-CoV-2 viral copies this assay can detect is 138 copies/mL. A negative result does not preclude SARS-Cov-2 infection and should not be used as the sole basis for treatment or other patient management decisions. A negative result may occur with  improper specimen collection/handling, submission of specimen other than nasopharyngeal swab, presence of viral mutation(s) within the areas targeted by this assay, and inadequate number  of viral copies(<138 copies/mL). A negative result must be combined with clinical observations, patient history, and epidemiological information. The expected result is Negative.  Fact Sheet for Patients:  BloggerCourse.com  Fact Sheet for Healthcare Providers:  SeriousBroker.it  This test is no t yet approved or cleared by the [place] FDA and  has been authorized for detection and/or diagnosis of SARS-CoV-2 by FDA under an Emergency Use Authorization (EUA). This EUA will remain  in effect (meaning this test can be used) for the duration of the COVID-19 declaration under Section 564(b)(1) of the Act, 21 U.S.C.section 360bbb-3(b)(1), unless the authorization is terminated  or revoked sooner.       Influenza A by PCR NEGATIVE NEGATIVE Final   Influenza B by PCR NEGATIVE NEGATIVE Final    Comment: (NOTE) The Xpert Xpress SARS-CoV-2/FLU/RSV plus assay is intended as an aid in the diagnosis of influenza from Nasopharyngeal swab specimens and should not be used as a sole basis for treatment. Nasal washings and aspirates are unacceptable for Xpert Xpress SARS-CoV-2/FLU/RSV testing.  Fact Sheet for Patients: BloggerCourse.com  Fact Sheet for Healthcare Providers: SeriousBroker.it  This test is not yet approved or cleared by the [place] FDA and has been authorized for detection and/or diagnosis of SARS-CoV-2 by FDA under an Emergency Use Authorization (EUA). This EUA will remain in effect (meaning this test can be used) for the duration of the COVID-19 declaration under Section 564(b)(1) of the Act, 21 U.S.C. section 360bbb-3(b)(1), unless the authorization is terminated or revoked.  Performed at [facility], [address] [place], [place] [address]   Blood culture (routine x 2)     Status: None   Collection Time: 05/03/21  4:15 AM   Specimen: BLOOD  Result  Value Ref Range Status   Specimen Description BLOOD SITE NOT SPECIFIED  Final   Special Requests   Final    BOTTLES DRAWN AEROBIC AND ANAEROBIC Blood Culture results may not be optimal due to an inadequate volume of blood received in culture bottles   Culture   Final    NO GROWTH 5 DAYS Performed at [facility], [address] [place], [place] [address]    Report Status 05/08/2021 FINAL  Final  Blood culture (routine x 2)     Status: None  Collection Time: 05/03/21  4:17 AM   Specimen: BLOOD  Result Value Ref Range Status   Specimen Description BLOOD SITE NOT SPECIFIED  Final   Special Requests   Final    BOTTLES DRAWN AEROBIC AND ANAEROBIC Blood Culture results may not be optimal due to an inadequate volume of blood received in culture bottles   Culture   Final    NO GROWTH 5 DAYS Performed at [facility], [place] [address], [place], [place] [address]    Report Status 05/08/2021 FINAL  Final  Resp Panel by RT-PCR (Flu A&B, Covid) Nasopharyngeal Swab     Status: None   Collection Time: 05/05/21  8:24 PM   Specimen: Nasopharyngeal Swab; Nasopharyngeal(NP) swabs in vial transport medium  Result Value Ref Range Status   SARS Coronavirus 2 by RT PCR NEGATIVE NEGATIVE Final    Comment: (NOTE) SARS-CoV-2 target nucleic acids are NOT DETECTED.  The SARS-CoV-2 RNA is generally detectable in upper respiratory specimens during the acute phase of infection. The lowest concentration of SARS-CoV-2 viral copies this assay can detect is 138 copies/mL. A negative result does not preclude SARS-Cov-2 infection and should not be used as the sole basis for treatment or other patient management decisions. A negative result may occur with  improper specimen collection/handling, submission of specimen other than nasopharyngeal swab, presence of viral mutation(s) within the areas targeted by this assay, and inadequate number of viral copies(<138 copies/mL). A negative result must be  combined with clinical observations, patient history, and epidemiological information. The expected result is Negative.  Fact Sheet for Patients:  EntrepreneurPulse.com.au  Fact Sheet for Healthcare Providers:  IncredibleEmployment.be  This test is no t yet approved or cleared by the [place] FDA and  has been authorized for detection and/or diagnosis of SARS-CoV-2 by FDA under an Emergency Use Authorization (EUA). This EUA will remain  in effect (meaning this test can be used) for the duration of the COVID-19 declaration under Section 564(b)(1) of the Act, 21 U.S.C.section 360bbb-3(b)(1), unless the authorization is terminated  or revoked sooner.       Influenza A by PCR NEGATIVE NEGATIVE Final   Influenza B by PCR NEGATIVE NEGATIVE Final    Comment: (NOTE) The Xpert Xpress SARS-CoV-2/FLU/RSV plus assay is intended as an aid in the diagnosis of influenza from Nasopharyngeal swab specimens and should not be used as a sole basis for treatment. Nasal washings and aspirates are unacceptable for Xpert Xpress SARS-CoV-2/FLU/RSV testing.  Fact Sheet for Patients: EntrepreneurPulse.com.au  Fact Sheet for Healthcare Providers: IncredibleEmployment.be  This test is not yet approved or cleared by the [place] FDA and has been authorized for detection and/or diagnosis of SARS-CoV-2 by FDA under an Emergency Use Authorization (EUA). This EUA will remain in effect (meaning this test can be used) for the duration of the COVID-19 declaration under Section 564(b)(1) of the Act, 21 U.S.C. section 360bbb-3(b)(1), unless the authorization is terminated or revoked.  Performed at [facility], [place] [address], [place], [place] [address]   Culture, blood (routine x 2)     Status: None (Preliminary result)   Collection Time: 05/05/21  8:40 PM   Specimen: BLOOD  Result Value Ref Range Status   Specimen  Description BLOOD RIGHT ANTECUBITAL  Final   Special Requests   Final    BOTTLES DRAWN AEROBIC AND ANAEROBIC Blood Culture adequate volume   Culture   Final    NO GROWTH 2 DAYS Performed at [facility],  [address] [place] [address], [place], [place] [address]    Report Status PENDING  Incomplete  Culture, blood (routine x 2)     Status: None (Preliminary result)   Collection Time: 05/05/21  8:45 PM   Specimen: BLOOD RIGHT HAND  Result Value Ref Range Status   Specimen Description BLOOD RIGHT HAND  Final   Special Requests   Final    BOTTLES DRAWN AEROBIC AND ANAEROBIC Blood Culture adequate volume   Culture   Final    NO GROWTH 2 DAYS Performed at [facility], [place] [address] [place], [place] [address]    Report Status PENDING  Incomplete  Urine culture     Status: None   Collection Time: 05/05/21 10:36 PM   Specimen: Urine, Random  Result Value Ref Range Status   Specimen Description URINE, RANDOM  Final   Special Requests NONE  Final   Culture   Final    NO GROWTH Performed at [facility], [place] [address], [place], [place] [address]    Report Status 05/07/2021 FINAL  Final  MRSA PCR Screening     Status: None   Collection Time: 05/06/21  8:01 PM   Specimen: Nasopharyngeal  Result Value Ref Range Status   MRSA by PCR NEGATIVE NEGATIVE Final    Comment:        The GeneXpert MRSA Assay (FDA approved for NASAL specimens only), is one component of a comprehensive MRSA colonization surveillance program. It is not intended to diagnose MRSA infection nor to guide or monitor treatment for MRSA infections. Performed at [facility], [place] [address] [place], [place] [address]          Radiology Studies: DG Wrist 2 Views Right  Result Date: 05/08/2021 CLINICAL DATA:  Wrist pain that began yesterday uncertain etiology. Affected grip strength. EXAM: RIGHT WRIST - 2 VIEW COMPARISON:  May 15, 2019 FINDINGS: There is no evidence of fracture or dislocation. There  is no evidence of arthropathy or other focal bone abnormality. IMPRESSION: Negative. Electronically Signed   By: Zetta Bills M.D.   On: 05/08/2021 09:27   ECHOCARDIOGRAM COMPLETE  Result Date: 05/07/2021    ECHOCARDIOGRAM REPORT   Patient Name:   [name] Date of Exam: 05/07/2021 Medical Rec #:  [id number]         Height:       71.0 in Accession #:    [id number]        Weight:       264.6 lb Date of Birth:  12-02-1961          BSA:          2.375 m Patient Age:    46 years          BP:           101/76 mmHg Patient Gender: M                 HR:           66 bpm. Exam Location:  Inpatient Procedure: 2D Echo, 3D Echo, Color Doppler, Cardiac Doppler and Intracardiac            Opacification Agent Indications:    Fever R50.9  History:        Patient has prior history of Echocardiogram examinations, most                 recent 01/20/2021. CHF, Arrythmias:Atrial Fibrillation; Risk  Factors:Hypertension, Dyslipidemia and Diabetes. Chronic kidney                 disease.                 Aortic Valve: 27 mm [facility] Ease valve is present in the                 aortic position. Procedure Date: 04/05/2015.  Sonographer:    Darlina Sicilian RDCS Referring Phys: [id number] [place]  1. No apical thrombus. Left ventricular ejection fraction, by estimation, is 20 to 25%. The left ventricle has severely decreased function. The left ventricle demonstrates global hypokinesis. The left ventricular internal cavity size was moderately dilated. There is moderate left ventricular hypertrophy. Left ventricular diastolic function could not be evaluated.  2. Right ventricular systolic function is mildly reduced. The right ventricular size is mildly enlarged. There is moderately elevated pulmonary artery systolic pressure.  3. Left atrial size was moderately dilated.  4. The mitral valve is abnormal. Mild mitral valve regurgitation.  5. The aortic valve has been repaired/replaced. Aortic  valve regurgitation is mild. There is a 27 mm [facility] Ease valve present in the aortic position. Procedure Date: 04/05/2015. Aortic valve mean gradient measures 12.0 mmHg. DI is 0.32  6. The inferior vena cava is dilated in size with <50% respiratory variability, suggesting right atrial pressure of 15 mmHg. Comparison(s): Changes from prior study are noted. 01/20/2021: LVEF 20-25%, global hypokinesis, 27 mm [facility] MagnaEase valve, mean gradient 8 mmHg. Conclusion(s)/Recommendation(s): No evidence of valvular vegetations on this transthoracic echocardiogram, although the gradient across the bioprosthetic aortic valve has increased. Would recommend a transesophageal echocardiogram to exclude infective endocarditis if clinically indicated. FINDINGS  Left Ventricle: No apical thrombus. Left ventricular ejection fraction, by estimation, is 20 to 25%. The left ventricle has severely decreased function. The left ventricle demonstrates global hypokinesis. Definity contrast agent was given IV to delineate the left ventricular endocardial borders. The left ventricular internal cavity size was moderately dilated. There is moderate left ventricular hypertrophy. Left ventricular diastolic function could not be evaluated due to atrial fibrillation. Left ventricular diastolic function could not be evaluated. Right Ventricle: The right ventricular size is mildly enlarged. No increase in right ventricular wall thickness. Right ventricular systolic function is mildly reduced. There is moderately elevated pulmonary artery systolic pressure. The tricuspid regurgitant velocity is 2.91 m/s, and with an assumed right atrial pressure of 15 mmHg, the estimated right ventricular systolic pressure is 84.1 mmHg. Left Atrium: Left atrial size was moderately dilated. Right Atrium: Right atrial size was normal in size. Pericardium: There is no evidence of pericardial effusion. Mitral Valve: The mitral valve is abnormal. There is mild  thickening of the mitral valve leaflet(s). Mild mitral valve regurgitation. Tricuspid Valve: The tricuspid valve is grossly normal. Tricuspid valve regurgitation is mild. Aortic Valve: The aortic valve has been repaired/replaced. Aortic valve regurgitation is mild. Aortic valve mean gradient measures 12.0 mmHg. Aortic valve peak gradient measures 23.6 mmHg. There is a 27 mm [facility] Ease valve present in the aortic position. Procedure Date: 04/05/2015. Pulmonic Valve: The pulmonic valve was grossly normal. Pulmonic valve regurgitation is trivial. Aorta: The aortic root and ascending aorta are structurally normal, with no evidence of dilitation. Venous: The inferior vena cava is dilated in size with less than 50% respiratory variability, suggesting right atrial pressure of 15 mmHg. IAS/Shunts: No atrial level shunt detected by color flow Doppler.  LEFT VENTRICLE PLAX 2D LVIDd:  6.40 cm LVIDs:         4.85 cm LV PW:         1.70 cm LV IVS:        1.30 cm                        3D Volume EF:                        3D EF:        24 %                        LV EDV:       397 ml                        LV ESV:       302 ml                        LV SV:        95 ml RIGHT VENTRICLE RV S prime:     6.83 cm/s TAPSE (M-mode): 1.2 cm LEFT ATRIUM              Index       RIGHT ATRIUM           Index LA diam:        5.25 cm  2.21 cm/m  RA Area:     24.60 cm LA Vol (A2C):   106.0 ml 44.64 ml/m RA Volume:   70.30 ml  29.60 ml/m LA Vol (A4C):   112.0 ml 47.16 ml/m LA Biplane Vol: 113.0 ml 47.58 ml/m  AORTIC VALVE AV Vmax:           242.80 cm/s AV Vmean:          154.400 cm/s AV VTI:            0.404 m AV Peak Grad:      23.6 mmHg AV Mean Grad:      12.0 mmHg LVOT Vmax:         80.53 cm/s LVOT Vmean:        50.325 cm/s LVOT VTI:          0.129 m LVOT/AV VTI ratio: 0.32  AORTA Ao Asc diam: 3.10 cm MITRAL VALVE                TRICUSPID VALVE MV Area (PHT): 6.83 cm     TR Peak grad:   33.9 mmHg MV Decel Time: 111 msec      TR Vmax:        291.00 cm/s MV E velocity: 101.60 cm/s                             SHUNTS                             Systemic VTI: 0.13 m Lyman Bishop MD Electronically signed by Lyman Bishop MD Signature Date/Time: 05/07/2021/4:35:54 PM    Final    VAS [place] LOWER EXTREMITY VENOUS (DVT)  Result Date: 05/06/2021  Lower Venous DVT Study Patient Name:  [name]  Date of Exam:   05/06/2021 Medical Rec #: [id number]          Accession #:  [id number] Date of Birth: 04/17/1961           Patient Gender: M Patient Age:   60Y Exam Location:  [facility] Procedure:      VAS [place] LOWER EXTREMITY VENOUS (DVT) Referring Phys: [id number] Noland Fordyce Jilliana Burkes --------------------------------------------------------------------------------  Indications: Edema.  Comparison Study: 05-11-2019 Bilateral lower extremity venous study was negative                   for DVT. Performing Technologist: Abram Sander RVS  Examination Guidelines: A complete evaluation includes B-mode imaging, spectral Doppler, color Doppler, and power Doppler as needed of all accessible portions of each vessel. Bilateral testing is considered an integral part of a complete examination. Limited examinations for reoccurring indications may be performed as noted. The reflux portion of the exam is performed with the patient in reverse Trendelenburg.  +---------+---------------+---------+-----------+----------+--------------+ RIGHT    CompressibilityPhasicitySpontaneityPropertiesThrombus Aging +---------+---------------+---------+-----------+----------+--------------+ CFV      Full           Yes      Yes                                 +---------+---------------+---------+-----------+----------+--------------+ SFJ      Full                                                        +---------+---------------+---------+-----------+----------+--------------+ FV Prox  Full                                                         +---------+---------------+---------+-----------+----------+--------------+ FV Mid   Full                                                        +---------+---------------+---------+-----------+----------+--------------+ FV DistalFull                                                        +---------+---------------+---------+-----------+----------+--------------+ PFV      Full                                                        +---------+---------------+---------+-----------+----------+--------------+ POP      Full           Yes      Yes                                 +---------+---------------+---------+-----------+----------+--------------+ PTV      Full                                                        +---------+---------------+---------+-----------+----------+--------------+  PERO     Full                                                        +---------+---------------+---------+-----------+----------+--------------+   +---------+---------------+---------+-----------+----------+--------------+ LEFT     CompressibilityPhasicitySpontaneityPropertiesThrombus Aging +---------+---------------+---------+-----------+----------+--------------+ CFV      Full           Yes      Yes                                 +---------+---------------+---------+-----------+----------+--------------+ SFJ      Full                                                        +---------+---------------+---------+-----------+----------+--------------+ FV Prox  Full                                                        +---------+---------------+---------+-----------+----------+--------------+ FV Mid   Full                                                        +---------+---------------+---------+-----------+----------+--------------+ FV DistalFull                                                         +---------+---------------+---------+-----------+----------+--------------+ PFV      Full                                                        +---------+---------------+---------+-----------+----------+--------------+ POP      Full           Yes      Yes                                 +---------+---------------+---------+-----------+----------+--------------+ PTV      Full                                                        +---------+---------------+---------+-----------+----------+--------------+ PERO     Full                                                        +---------+---------------+---------+-----------+----------+--------------+  Summary: RIGHT: - There is no evidence of deep vein thrombosis in the lower extremity.  - No cystic structure found in the popliteal fossa.  LEFT: - There is no evidence of deep vein thrombosis in the lower extremity.  - No cystic structure found in the popliteal fossa.  *See table(s) above for measurements and observations. Electronically signed by Deitra Mayo MD on 05/06/2021 at 8:15:33 PM.    Final         Scheduled Meds: . azithromycin  500 mg Oral Daily  . empagliflozin  10 mg Oral QAC breakfast  . furosemide  40 mg Oral Daily  . gabapentin  200 mg Oral TID  . hydrALAZINE  10 mg Oral Q8H  . isosorbide mononitrate  30 mg Oral Daily  . sacubitril-valsartan  1 tablet Oral BID  . spironolactone  25 mg Oral Daily  . [START ON 05/09/2021] warfarin  10 mg Oral Once per day on Mon Wed Fri  . warfarin  7.5 mg Oral Once per day on Sun Tue Thu Sat  . Warfarin - Pharmacist Dosing Inpatient   Does not apply q1600   Continuous Infusions:   LOS: 1 day    Georgette Shell, MD 05/08/2021, 12:45 PM

## 2021-05-09 ENCOUNTER — Observation Stay (HOSPITAL_COMMUNITY): Payer: Medicare (Managed Care)

## 2021-05-09 ENCOUNTER — Encounter (HOSPITAL_COMMUNITY): Payer: Medicare Other | Admitting: Internal Medicine

## 2021-05-09 DIAGNOSIS — E785 Hyperlipidemia, unspecified: Secondary | ICD-10-CM | POA: Diagnosis present

## 2021-05-09 DIAGNOSIS — Z6836 Body mass index (BMI) 36.0-36.9, adult: Secondary | ICD-10-CM | POA: Diagnosis not present

## 2021-05-09 DIAGNOSIS — N182 Chronic kidney disease, stage 2 (mild): Secondary | ICD-10-CM | POA: Diagnosis not present

## 2021-05-09 DIAGNOSIS — G4700 Insomnia, unspecified: Secondary | ICD-10-CM | POA: Diagnosis present

## 2021-05-09 DIAGNOSIS — M25572 Pain in left ankle and joints of left foot: Secondary | ICD-10-CM

## 2021-05-09 DIAGNOSIS — K7460 Unspecified cirrhosis of liver: Secondary | ICD-10-CM | POA: Diagnosis present

## 2021-05-09 DIAGNOSIS — I513 Intracardiac thrombosis, not elsewhere classified: Secondary | ICD-10-CM | POA: Diagnosis not present

## 2021-05-09 DIAGNOSIS — Z79899 Other long term (current) drug therapy: Secondary | ICD-10-CM | POA: Diagnosis not present

## 2021-05-09 DIAGNOSIS — I5023 Acute on chronic systolic (congestive) heart failure: Secondary | ICD-10-CM | POA: Diagnosis not present

## 2021-05-09 DIAGNOSIS — R509 Fever, unspecified: Secondary | ICD-10-CM | POA: Diagnosis present

## 2021-05-09 DIAGNOSIS — M25571 Pain in right ankle and joints of right foot: Secondary | ICD-10-CM

## 2021-05-09 DIAGNOSIS — M79672 Pain in left foot: Secondary | ICD-10-CM | POA: Diagnosis not present

## 2021-05-09 DIAGNOSIS — Z7901 Long term (current) use of anticoagulants: Secondary | ICD-10-CM | POA: Diagnosis not present

## 2021-05-09 DIAGNOSIS — E1140 Type 2 diabetes mellitus with diabetic neuropathy, unspecified: Secondary | ICD-10-CM | POA: Diagnosis present

## 2021-05-09 DIAGNOSIS — Z8616 Personal history of COVID-19: Secondary | ICD-10-CM | POA: Diagnosis not present

## 2021-05-09 DIAGNOSIS — M79671 Pain in right foot: Secondary | ICD-10-CM | POA: Diagnosis not present

## 2021-05-09 DIAGNOSIS — N1832 Chronic kidney disease, stage 3b: Secondary | ICD-10-CM | POA: Diagnosis not present

## 2021-05-09 DIAGNOSIS — I420 Dilated cardiomyopathy: Secondary | ICD-10-CM

## 2021-05-09 DIAGNOSIS — I248 Other forms of acute ischemic heart disease: Secondary | ICD-10-CM | POA: Diagnosis present

## 2021-05-09 DIAGNOSIS — Z7984 Long term (current) use of oral hypoglycemic drugs: Secondary | ICD-10-CM | POA: Diagnosis not present

## 2021-05-09 DIAGNOSIS — M25531 Pain in right wrist: Secondary | ICD-10-CM

## 2021-05-09 DIAGNOSIS — E1165 Type 2 diabetes mellitus with hyperglycemia: Secondary | ICD-10-CM | POA: Diagnosis not present

## 2021-05-09 DIAGNOSIS — E1151 Type 2 diabetes mellitus with diabetic peripheral angiopathy without gangrene: Secondary | ICD-10-CM | POA: Diagnosis present

## 2021-05-09 DIAGNOSIS — M109 Gout, unspecified: Secondary | ICD-10-CM | POA: Diagnosis present

## 2021-05-09 DIAGNOSIS — I130 Hypertensive heart and chronic kidney disease with heart failure and stage 1 through stage 4 chronic kidney disease, or unspecified chronic kidney disease: Secondary | ICD-10-CM | POA: Diagnosis present

## 2021-05-09 DIAGNOSIS — J029 Acute pharyngitis, unspecified: Secondary | ICD-10-CM | POA: Diagnosis present

## 2021-05-09 DIAGNOSIS — D509 Iron deficiency anemia, unspecified: Secondary | ICD-10-CM | POA: Diagnosis present

## 2021-05-09 DIAGNOSIS — I10 Essential (primary) hypertension: Secondary | ICD-10-CM

## 2021-05-09 DIAGNOSIS — Z953 Presence of xenogenic heart valve: Secondary | ICD-10-CM | POA: Diagnosis not present

## 2021-05-09 DIAGNOSIS — I272 Pulmonary hypertension, unspecified: Secondary | ICD-10-CM | POA: Diagnosis present

## 2021-05-09 DIAGNOSIS — Z952 Presence of prosthetic heart valve: Secondary | ICD-10-CM

## 2021-05-09 DIAGNOSIS — I5043 Acute on chronic combined systolic (congestive) and diastolic (congestive) heart failure: Secondary | ICD-10-CM | POA: Diagnosis present

## 2021-05-09 DIAGNOSIS — E1122 Type 2 diabetes mellitus with diabetic chronic kidney disease: Secondary | ICD-10-CM | POA: Diagnosis present

## 2021-05-09 DIAGNOSIS — Z20822 Contact with and (suspected) exposure to covid-19: Secondary | ICD-10-CM | POA: Diagnosis present

## 2021-05-09 DIAGNOSIS — N1831 Chronic kidney disease, stage 3a: Secondary | ICD-10-CM | POA: Diagnosis present

## 2021-05-09 LAB — GLUCOSE, CAPILLARY
Glucose-Capillary: 120 mg/dL — ABNORMAL HIGH (ref 70–99)
Glucose-Capillary: 125 mg/dL — ABNORMAL HIGH (ref 70–99)
Glucose-Capillary: 198 mg/dL — ABNORMAL HIGH (ref 70–99)
Glucose-Capillary: 146 mg/dL — ABNORMAL HIGH (ref 70–99)

## 2021-05-09 LAB — PROTIME-INR
INR: 2.1 — ABNORMAL HIGH (ref 0.8–1.2)
Prothrombin Time: 23.2 seconds — ABNORMAL HIGH (ref 11.4–15.2)

## 2021-05-09 MED ORDER — COLCHICINE 0.6 MG PO TABS
0.6000 mg | ORAL_TABLET | Freq: Every day | ORAL | Status: DC
  Administered 2021-05-09 – 2021-05-14 (×6): 0.6 mg via ORAL
  Filled 2021-05-09 (×6): qty 1

## 2021-05-09 NOTE — Progress Notes (Signed)
Physical Therapy Treatment Patient Details Name: [name] MRN: [id number] DOB: 05/14/1961 Today's Date: 05/09/2021    History of Present Illness Pt is a 60 y/o male admitted 5/19 secondary to BLE pain and swelling. Workup pending. PMH includes HTN, s/p AVR, CHF, and DM.    PT Comments    Pt on phone with brother on entry, agrees to therapy, brother providing encouragement. Pt reports continued pain in bilateral feet and thighs especially with weightbearing and in R wrist with distraction and pronation. Pt limited in safe mobility by pain and related decreased command follow. Pt is supervision for bed mobility and min A for coming into standing. Pt unable to progress mobility due to pain. D/c plans updated to SNF level rehab due to inability to progress mobility for d/c home. PT will continue to follow acutely.     Follow Up Recommendations  SNF     Equipment Recommendations  Rolling walker with 5" wheels;3in1 (PT);Wheelchair (measurements PT);Wheelchair cushion (measurements PT)       Precautions / Restrictions Precautions Precautions: Fall Restrictions Weight Bearing Restrictions: No    Mobility  Bed Mobility Overal bed mobility: Needs Assistance Bed Mobility: Supine to Sit     Supine to sit: Supervision     General bed mobility comments: increased use of momentum to come to sitting EoB, no physical assist given    Transfers Overall transfer level: Needs assistance Equipment used: Rolling walker (2 wheeled) Transfers: Sit to/from Stand Sit to Stand: From elevated surface;Min assist         General transfer comment: min A for adjustment of posture with cuing for bringing chest upright and bringing pelvis forward to come to fully upright, demonstrated proper technique however pt refuses additional trial      Balance Overall balance assessment: Needs assistance Sitting-balance support: No upper extremity supported;Feet supported Sitting balance-Leahy Scale:  Good     Standing balance support: Bilateral upper extremity supported Standing balance-Leahy Scale: Poor Standing balance comment: Reliant on BUE support                            Cognition Arousal/Alertness: Awake/alert Behavior During Therapy: WFL for tasks assessed/performed Overall Cognitive Status: No family/caregiver present to determine baseline cognitive functioning                                        Exercises Other Exercises Other Exercises: triceps extensions leaning back in bed x3    General Comments General comments (skin integrity, edema, etc.): VSS on RA      Pertinent Vitals/Pain Pain Assessment: Faces Faces Pain Scale: Hurts even more Pain Location: bilateral feet and R wrist with distraction and pronation Pain Descriptors / Indicators: Aching;Pins and needles Pain Intervention(s): Limited activity within patient's tolerance;Monitored during session;Repositioned           PT Goals (current goals can now be found in the care plan section) Acute Rehab PT Goals Patient Stated Goal: to be able to walk PT Goal Formulation: With patient Time For Goal Achievement: 05/20/21 Potential to Achieve Goals: Good Progress towards PT goals: Not progressing toward goals - comment (limited by pain)    Frequency    Min 3X/week      PT Plan Current plan remains appropriate       AM-PAC PT "6 Clicks" Mobility   Outcome Measure  Help  needed turning from your back to your side while in a flat bed without using bedrails?: A Little Help needed moving from lying on your back to sitting on the side of a flat bed without using bedrails?: A Little Help needed moving to and from a bed to a chair (including a wheelchair)?: A Lot Help needed standing up from a chair using your arms (e.g., wheelchair or bedside chair)?: A Lot Help needed to walk in hospital room?: Total Help needed climbing 3-5 steps with a railing? : Total 6 Click Score:  12    End of Session Equipment Utilized During Treatment: Gait belt Activity Tolerance: Patient limited by pain Patient left: with bed alarm set;with call bell/phone within reach;in bed (sitting EoB) Nurse Communication: Mobility status PT Visit Diagnosis: Pain;Difficulty in walking, not elsewhere classified (R26.2) Pain - Right/Left:  (bilateral) Pain - part of body: Ankle and joints of foot     Time: 6384-5364 PT Time Calculation (min) (ACUTE ONLY): 28 min  Charges:  $Therapeutic Exercise: 23-37 mins                     Andre Swander B. Migdalia Dk PT, DPT Acute Rehabilitation Services Pager [phone] Office [phone]    [place] 05/09/2021, 4:49 PM

## 2021-05-09 NOTE — Discharge Instructions (Signed)

## 2021-05-09 NOTE — Consult Note (Addendum)
Date of Admission:  05/05/2021          Reason for Consult: Fever of unknown origin    Referring Provider: Dr. Zigmund Daniel   Assessment:  1. High fevers with drenching night sweats 2. History of aortic valve replacement 3. Bilateral severe ankle pain and limited range of motion concerning for possible septic arthritis 4. Apparent right wrist pain as well 5. Chronic kidney disease 6. We diagnosed diabetes mellitus 7. Congestive heart failure 8. Obesity 9. Gout  Plan:  1. Repeat blood cultures 2. And MRI both ankles and feet without contrast given his chronic kidney disease 3. May need orthopedic surgery consult depending upon MRI findings 4. I would recommend TEE as well 5. Will also likely need to image his wrist if he is still having wrist pain 6. Check CPK  Active Problems:   Congestive dilated cardiomyopathy (HCC)   Essential hypertension   CKD (chronic kidney disease), stage II-III   S/P AVR   Fever of unknown origin   Fever   Scheduled Meds: . colchicine  0.6 mg Oral Daily  . empagliflozin  10 mg Oral QAC breakfast  . furosemide  40 mg Oral Daily  . gabapentin  200 mg Oral TID  . hydrALAZINE  10 mg Oral Q8H  . isosorbide mononitrate  30 mg Oral Daily  . predniSONE  20 mg Oral Q breakfast  . sacubitril-valsartan  1 tablet Oral BID  . spironolactone  25 mg Oral Daily  . warfarin  10 mg Oral Once per day on Mon Wed Fri  . warfarin  7.5 mg Oral Once per day on Sun Tue Thu Sat  . Warfarin - Pharmacist Dosing Inpatient   Does not apply q1600   Continuous Infusions: PRN Meds:.acetaminophen **OR** acetaminophen, guaiFENesin  HPI: [name] is a 59 y.o. male past medical history significant for congestive heart failure hypertension history of aortic valve replacement on Coumadin came to the ER 16 May with fever and inability to walk for several days due to severe pain in his feet with swelling.  That particular day he had a fever of up to 101.5 in the ER  but admission labs were also pertinent for leukocytosis with a white count of 11,300.  He had plain films of his feet performed which failed to show any evidence of pathology.  He tells me that he was barely seen by a provider in the ER and that after he had his x-rays performed he was told by nurse that he needed to go and that if he did not leave security would be called.  By his account he had tremendous difficulty being able to get back home and required 2 people to help lift him into a car and transport him back to the house.  Then came back to the hospital 10 [place] to have inability to walk with severe pain in his ankles and feet bilaterally.  Apparently also has had right-sided wrist pain.  He was admitted to the hospitalist service and had a CT of the chest performed without contrast that failed to show any evidence of infection.  Been started on azithromycin for possible atypical pneumonia.  He has been given colchicine as well as prednisone for the possibility that he could be having a gout flare.  Despite this however his pain in his ankles and feet persists and he is continuing to have drenching night sweats.  He showed me his shirt which was lying on the couch  which was completely drenched in sweat from last night.  He has had a 2 D echocardiogram which did not show any vegetations.  I am worried based on his symptoms that he could have native valve, prosthetic valve endocarditis and/or possible polyarticular septic arthritis.  I spent greater than 80 minutes with the patient including greater than 50% of time in face to face counsel of the patient re workup for his fevers, and possible septic arthritis, native or prosthetic valve endocarditis.   Review of Systems: Review of Systems  Constitutional: Positive for fever. Negative for chills, malaise/fatigue and weight loss.  HENT: Negative for congestion and sore throat.   Eyes: Negative for blurred vision and photophobia.   Respiratory: Positive for cough. Negative for shortness of breath and wheezing.   Cardiovascular: Negative for chest pain, palpitations and leg swelling.  Gastrointestinal: Negative for abdominal pain, blood in stool, constipation, diarrhea, heartburn, melena, nausea and vomiting.  Genitourinary: Negative for dysuria, flank pain and hematuria.  Musculoskeletal: Positive for joint pain and myalgias. Negative for back pain and falls.  Skin: Negative for itching and rash.  Neurological: Positive for dizziness and weakness. Negative for focal weakness, loss of consciousness and headaches.  Endo/Heme/Allergies: Does not bruise/bleed easily.  Psychiatric/Behavioral: Negative for depression and suicidal ideas. The patient does not have insomnia.     Past Medical History:  Diagnosis Date  . Acute gastric ulcer with hemorrhage   . Acute respiratory failure ([place]) 05/11/2019  . Aortic valve disease    a. 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 Atrial flutter ([place])   . Benign hypertensive heart and renal disease   . Cardiogenic shock ([place])   . Chronic combined systolic and diastolic CHF (congestive heart failure) ([place])   . Chronic kidney disease (CKD), stage III (moderate) (HCC)   . COVID-19   . Diabetes mellitus ([place])   . Essential hypertension 11/19/2014  . Gout   . Hx of colonic polyps 11/11/2019  . Hyperlipidemia   . Insomnia   . Iron deficiency anemia   . Murmur   . Noncompliance   . Obesity   . Pseudomonas pneumonia ([place]) 05/16/2019  . Pulmonary hypertension (HCC)     Social History   Tobacco Use  . Smoking status: Never Smoker  . Smokeless tobacco: Never Used  Vaping Use  . Vaping Use: Never used  Substance Use Topics  . Alcohol use: Yes    Comment: 5 monthly  . Drug use: Never    Family History  Problem Relation Age of Onset  . Other Mother        MVA  . Liver disease Cousin        cirrhosis; alcohol  related  . Heart attack Neg Hx   . Colon cancer Neg Hx   . Esophageal cancer Neg Hx   . Stomach cancer Neg Hx   . Pancreatic cancer Neg Hx    No Known Allergies  OBJECTIVE: Blood pressure 113/87, pulse (!) 59, temperature 98.6 F (37 C), temperature source Oral, resp. rate 16, height 5\' 11"  (1.803 m), weight 120 kg, SpO2 96 %.  Physical Exam Constitutional:      Appearance: He is well-developed.  HENT:     Head: Normocephalic and atraumatic.  Eyes:     Conjunctiva/sclera: Conjunctivae normal.  Cardiovascular:     Rate and Rhythm: Regular rhythm. Bradycardia present.     Heart sounds: No murmur heard. No friction rub. No  gallop.   Pulmonary:     Effort: Pulmonary effort is normal. No respiratory distress.     Breath sounds: No stridor. No wheezing or rhonchi.  Abdominal:     General: There is no distension.     Palpations: Abdomen is soft. There is no mass.     Tenderness: There is no abdominal tenderness.     Hernia: No hernia is present.  Musculoskeletal:     Cervical back: Normal range of motion and neck supple.     Right ankle: Tenderness present. Decreased range of motion.     Left ankle: Tenderness present. Decreased range of motion.     Right foot: Decreased range of motion. Tenderness present.     Left foot: Decreased range of motion. Tenderness present.  Skin:    General: Skin is warm and dry.     Coloration: Skin is not pale.     Findings: No erythema or rash.  Neurological:     Mental Status: He is alert and oriented to person, place, and time.     Lab Results Lab Results  Component Value Date   WBC 11.3 (H) 05/06/2021   HGB 14.1 05/06/2021   HCT 42.7 05/06/2021   MCV 86.6 05/06/2021   PLT 285 05/06/2021    Lab Results  Component Value Date   CREATININE 1.69 (H) 05/06/2021   BUN 28 (H) 05/06/2021   NA 133 (L) 05/06/2021   K 4.8 05/06/2021   CL 99 05/06/2021   CO2 24 05/06/2021    Lab Results  Component Value Date   ALT 20 05/06/2021   AST  23 05/06/2021   ALKPHOS 91 05/06/2021   BILITOT 1.4 (H) 05/06/2021     Microbiology: Recent Results (from the past 240 hour(s))  Resp Panel by RT-PCR (Flu A&B, Covid) Nasopharyngeal Swab     Status: None   Collection Time: 05/03/21  3:47 AM   Specimen: Nasopharyngeal Swab; Nasopharyngeal(NP) swabs in vial transport medium  Result Value Ref Range Status   SARS Coronavirus 2 by RT PCR NEGATIVE NEGATIVE Final    Comment: (NOTE) SARS-CoV-2 target nucleic acids are NOT DETECTED.  The SARS-CoV-2 RNA is generally detectable in upper respiratory specimens during the acute phase of infection. The lowest concentration of SARS-CoV-2 viral copies this assay can detect is 138 copies/mL. A negative result does not preclude SARS-Cov-2 infection and should not be used as the sole basis for treatment or other patient management decisions. A negative result may occur with  improper specimen collection/handling, submission of specimen other than nasopharyngeal swab, presence of viral mutation(s) within the areas targeted by this assay, and inadequate number of viral copies(<138 copies/mL). A negative result must be combined with clinical observations, patient history, and epidemiological information. The expected result is Negative.  Fact Sheet for Patients:  EntrepreneurPulse.com.au  Fact Sheet for Healthcare Providers:  IncredibleEmployment.be  This test is no t yet approved or cleared by the [place] FDA and  has been authorized for detection and/or diagnosis of SARS-CoV-2 by FDA under an Emergency Use Authorization (EUA). This EUA will remain  in effect (meaning this test can be used) for the duration of the COVID-19 declaration under Section 564(b)(1) of the Act, 21 U.S.C.section 360bbb-3(b)(1), unless the authorization is terminated  or revoked sooner.       Influenza A by PCR NEGATIVE NEGATIVE Final   Influenza B by PCR NEGATIVE NEGATIVE Final     Comment: (NOTE) The Xpert Xpress SARS-CoV-2/FLU/RSV plus assay is intended as  an aid in the diagnosis of influenza from Nasopharyngeal swab specimens and should not be used as a sole basis for treatment. Nasal washings and aspirates are unacceptable for Xpert Xpress SARS-CoV-2/FLU/RSV testing.  Fact Sheet for Patients: EntrepreneurPulse.com.au  Fact Sheet for Healthcare Providers: IncredibleEmployment.be  This test is not yet approved or cleared by the [place] FDA and has been authorized for detection and/or diagnosis of SARS-CoV-2 by FDA under an Emergency Use Authorization (EUA). This EUA will remain in effect (meaning this test can be used) for the duration of the COVID-19 declaration under Section 564(b)(1) of the Act, 21 U.S.C. section 360bbb-3(b)(1), unless the authorization is terminated or revoked.  Performed at [facility], [place] [address] [place], [place] [address]   Blood culture (routine x 2)     Status: None   Collection Time: 05/03/21  4:15 AM   Specimen: BLOOD  Result Value Ref Range Status   Specimen Description BLOOD SITE NOT SPECIFIED  Final   Special Requests   Final    BOTTLES DRAWN AEROBIC AND ANAEROBIC Blood Culture results may not be optimal due to an inadequate volume of blood received in culture bottles   Culture   Final    NO GROWTH 5 DAYS Performed at [facility], [place] [address] [place], [place] [address]    Report Status 05/08/2021 FINAL  Final  Blood culture (routine x 2)     Status: None   Collection Time: 05/03/21  4:17 AM   Specimen: BLOOD  Result Value Ref Range Status   Specimen Description BLOOD SITE NOT SPECIFIED  Final   Special Requests   Final    BOTTLES DRAWN AEROBIC AND ANAEROBIC Blood Culture results may not be optimal due to an inadequate volume of blood received in culture bottles   Culture   Final    NO GROWTH 5 DAYS Performed at [facility], [place] [address], [place], [place] [address]    Report Status 05/08/2021 FINAL  Final  Resp Panel by RT-PCR (Flu A&B, Covid) Nasopharyngeal Swab     Status: None   Collection Time: 05/05/21  8:24 PM   Specimen: Nasopharyngeal Swab; Nasopharyngeal(NP) swabs in vial transport medium  Result Value Ref Range Status   SARS Coronavirus 2 by RT PCR NEGATIVE NEGATIVE Final    Comment: (NOTE) SARS-CoV-2 target nucleic acids are NOT DETECTED.  The SARS-CoV-2 RNA is generally detectable in upper respiratory specimens during the acute phase of infection. The lowest concentration of SARS-CoV-2 viral copies this assay can detect is 138 copies/mL. A negative result does not preclude SARS-Cov-2 infection and should not be used as the sole basis for treatment or other patient management decisions. A negative result may occur with  improper specimen collection/handling, submission of specimen other than nasopharyngeal swab, presence of viral mutation(s) within the areas targeted by this assay, and inadequate number of viral copies(<138 copies/mL). A negative result must be combined with clinical observations, patient history, and epidemiological information. The expected result is Negative.  Fact Sheet for Patients:  EntrepreneurPulse.com.au  Fact Sheet for Healthcare Providers:  IncredibleEmployment.be  This test is no t yet approved or cleared by the [place] FDA and  has been authorized for detection and/or diagnosis of SARS-CoV-2 by FDA under an Emergency Use Authorization (EUA). This EUA will remain  in effect (meaning this test can be used) for the duration of the COVID-19 declaration under Section 564(b)(1) of the Act, 21 U.S.C.section 360bbb-3(b)(1), unless the authorization  is terminated  or revoked sooner.       Influenza A by PCR NEGATIVE NEGATIVE Final   Influenza B by PCR NEGATIVE NEGATIVE Final    Comment: (NOTE) The Xpert Xpress SARS-CoV-2/FLU/RSV plus  assay is intended as an aid in the diagnosis of influenza from Nasopharyngeal swab specimens and should not be used as a sole basis for treatment. Nasal washings and aspirates are unacceptable for Xpert Xpress SARS-CoV-2/FLU/RSV testing.  Fact Sheet for Patients: EntrepreneurPulse.com.au  Fact Sheet for Healthcare Providers: IncredibleEmployment.be  This test is not yet approved or cleared by the [place] FDA and has been authorized for detection and/or diagnosis of SARS-CoV-2 by FDA under an Emergency Use Authorization (EUA). This EUA will remain in effect (meaning this test can be used) for the duration of the COVID-19 declaration under Section 564(b)(1) of the Act, 21 U.S.C. section 360bbb-3(b)(1), unless the authorization is terminated or revoked.  Performed at [facility], [place] [address] [place], [place] [address]   Culture, blood (routine x 2)     Status: None (Preliminary result)   Collection Time: 05/05/21  8:40 PM   Specimen: BLOOD  Result Value Ref Range Status   Specimen Description BLOOD RIGHT ANTECUBITAL  Final   Special Requests   Final    BOTTLES DRAWN AEROBIC AND ANAEROBIC Blood Culture adequate volume   Culture   Final    NO GROWTH 3 DAYS Performed at [facility], [place] [address] [place], [place] [address]    Report Status PENDING  Incomplete  Culture, blood (routine x 2)     Status: None (Preliminary result)   Collection Time: 05/05/21  8:45 PM   Specimen: BLOOD RIGHT HAND  Result Value Ref Range Status   Specimen Description BLOOD RIGHT HAND  Final   Special Requests   Final    BOTTLES DRAWN AEROBIC AND ANAEROBIC Blood Culture adequate volume   Culture   Final    NO GROWTH 3 DAYS Performed at [facility], [place] [address] [place], [place] [address]    Report Status PENDING  Incomplete  Urine culture     Status: None   Collection Time: 05/05/21 10:36 PM   Specimen: Urine, Random  Result  Value Ref Range Status   Specimen Description URINE, RANDOM  Final   Special Requests NONE  Final   Culture   Final    NO GROWTH Performed at [facility], [place] [address] [place], [place] [address]    Report Status 05/07/2021 FINAL  Final  MRSA PCR Screening     Status: None   Collection Time: 05/06/21  8:01 PM   Specimen: Nasopharyngeal  Result Value Ref Range Status   MRSA by PCR NEGATIVE NEGATIVE Final    Comment:        The GeneXpert MRSA Assay (FDA approved for NASAL specimens only), is one component of a comprehensive MRSA colonization surveillance program. It is not intended to diagnose MRSA infection nor to guide or monitor treatment for MRSA infections. Performed at [facility], [place] [address] [place], [place] [address]     Alcide Evener, [place] [facility] [phone] pager  05/09/2021, 11:31 AM

## 2021-05-09 NOTE — Progress Notes (Signed)
Occupational Therapy Treatment Patient Details Name: [name] MRN: [id number] DOB: 1961-02-09 Today's Date: 05/09/2021    History of present illness Pt is a 60 y/o male admitted 5/19 secondary to BLE pain and swelling. Workup pending. PMH includes HTN, s/p AVR, CHF, and DM.   OT comments  Pt making gradual progress towards OT goals this session. 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 Pt able to sit <>stand x3 from elevated EOB with MIN A +2 but unable to take steps to recliner or even march in place. Pt continues to want to DC to rehab ( unsure if pt will qualify for SNF) although pt does reports that a w/c would fit through his home if needed. Pt would continue to benefit from skilled occupational therapy while admitted and after d/c to address the below listed limitations in order to improve overall functional mobility and facilitate independence with BADL participation. DC plan remains appropriate, will follow acutely per POC.     Follow Up Recommendations  Home health OT    Equipment Recommendations  3 in 1 bedside commode    Recommendations for Other Services      Precautions / Restrictions Precautions Precautions: Fall Restrictions Weight Bearing Restrictions: No       Mobility Bed Mobility Overal bed mobility: Needs Assistance Bed Mobility: Sit to Supine;Supine to Sit     Supine to sit: Supervision Sit to supine: Supervision   General bed mobility comments: no physical assist needed    Transfers Overall transfer level: Needs assistance Equipment used: Rolling walker (2 wheeled) Transfers: Sit to/from Stand Sit to Stand: Min assist;+2 physical assistance;From elevated surface         General transfer comment: pt sit<>stand x3 from elevated EOB with MIN A +2 to power up into standing    Balance Overall balance assessment: Needs assistance Sitting-balance support: No upper extremity supported;Feet supported Sitting  balance-Leahy Scale: Good Sitting balance - Comments: sitting EOB for ADLS with no LOB   Standing balance support: Bilateral upper extremity supported Standing balance-Leahy Scale: Poor Standing balance comment: Reliant on BUE support and external assist                           ADL either performed or assessed with clinical judgement   ADL Overall ADL's : Needs assistance/impaired     Grooming: Wash/dry face;Oral care;Sitting;Set up Grooming Details (indicate cue type and reason): set- up from sitting EOB     Lower Body Bathing: Total assistance;Sitting/lateral leans Lower Body Bathing Details (indicate cue type and reason): unable to reach LB from EOB without assist     Lower Body Dressing: Total assistance;Sitting/lateral leans Lower Body Dressing Details (indicate cue type and reason): to don socks, unable to reach feet from EOB without assist   Toilet Transfer Details (indicate cue type and reason): unable to take steps, MIN A +2 to stand only         Functional mobility during ADLs: Minimal assistance;+2 for physical assistance;+2 for safety/equipment (sit<>stand only) General ADL Comments: pt most limited by pain in feet, unable to take steps     Vision       Perception     Praxis      Cognition Arousal/Alertness: Awake/alert Behavior During Therapy: WFL for tasks assessed/performed Overall Cognitive Status: No family/caregiver present to determine baseline cognitive functioning  Exercises     Shoulder Instructions       General Comments      Pertinent Vitals/ Pain       Pain Assessment: Faces Faces Pain Scale: Hurts whole lot Pain Location: bilateral feet Pain Descriptors / Indicators: Aching;Pins and needles Pain Intervention(s): Limited activity within patient's tolerance;Monitored during session;Repositioned  Home Living                                           Prior Functioning/Environment              Frequency  Min 2X/week        Progress Toward Goals  OT Goals(current goals can now be found in the care plan section)  Progress towards OT goals: Progressing toward goals  Acute Rehab OT Goals Patient Stated Goal: to get out of here OT Goal Formulation: With patient Time For Goal Achievement: 05/21/21 Potential to Achieve Goals: [place] Discharge plan remains appropriate;Frequency remains appropriate    Co-evaluation                 AM-PAC OT "6 Clicks" Daily Activity     Outcome Measure   Help from another person eating meals?: None Help from another person taking care of personal grooming?: A Little Help from another person toileting, which includes using toliet, bedpan, or urinal?: A Lot Help from another person bathing (including washing, rinsing, drying)?: A Lot Help from another person to put on and taking off regular upper body clothing?: None Help from another person to put on and taking off regular lower body clothing?: Total 6 Click Score: 16    End of Session Equipment Utilized During Treatment: Gait belt;Rolling walker  OT Visit Diagnosis: Muscle weakness (generalized) (M62.81);Pain Pain - Right/Left:  (bilateral feet)   Activity Tolerance Patient limited by pain;Other (comment) (pain in bilateral feet; unable to progress functional mobility)   Patient Left in bed;with call bell/phone within reach;with bed alarm set   Nurse Communication Mobility status;Other (comment) (unable to progress OOB)        Time: 7829-5621 OT Time Calculation (min): 43 min  Charges: OT General Charges $OT Visit: 1 Visit OT Treatments $Self Care/Home Management : 38-52 mins Harley Alto., COTA/L Acute Rehabilitation Services [place]    Precious Haws 05/09/2021, 10:03 AM

## 2021-05-09 NOTE — Progress Notes (Signed)
PROGRESS NOTE    [name]  [id number] DOB: 1961/01/28 DOA: 05/05/2021 PCP: Flossie Buffy, NP   Brief Narrative: 60 year old male diabetes AVR on Coumadin history of hypertension and congestive heart failure admitted with complaints of bilateral foot pain and swelling.  He also reports a productive cough. He denies chest pain does report shortness of breath.  Assessment & Plan:   Principal Problem:   Fever of unknown origin Active Problems:   Congestive dilated cardiomyopathy (HCC)   Essential hypertension   CKD (chronic kidney disease), stage II-III   S/P AVR   Fever  #1 bilateral foot pain -likely multifactorial secondary to gout, neuropathy, musculoskeletal pain -work-up includes x-rays of both feet and leg and venous Doppler which does not reveal anything acute.  No fracture seen he does have some chronic changes heel spurs.  His pain is likely due to neuropathy from longstanding type 2 diabetes.  He thinks he probably have had gout in the past currently not on any medications He continues with bilateral feet pain with elevated uric acid trending up to 10.2.  I will start him on colchicine. CRP and sed rate is elevated. Increase Neurontin to 200 mg 3 times a day. He reports severe pain right wrist,check xrays of wrist PT note reports he walked 10 feet with 2 assist.  Patient lives alone and he will not be able to care for himself.  He will and he wants to go to a rehab prior to discharge home so that that he can take care of himself.  #[id number]-appreciate ID input.when patient visited the ED on 05/02/2021 he had a temperature of 101.5.  He has been afebrile today and his T-max is 100.  Cultures have been negative so far.  Mild leukocytosis at 12.6.  No evidence of active acute infection at this time noted. Patient is bringing up green phlegm with wheezing and shortness of breath. Zithromax for 5 days.  #3 acute on chronic systolic heart failure-he was admitted with  shortness of breath.  His last echo from February 2022 with ejection fraction 25 to 25%. Exam with trace bilateral pitting edema, decreased breath sounds at the bases chest x-rays with bibasilar atelectasis. BNP 1072 Continue Lasix 40 mg daily. Troponin peaked at 84. Troponin mildly elevated likely from demand ischemia Continue Lasix Entresto and Aldactone CT chest-mild scarring in bases bilaterally with stable cardiomegaly no acute abnormality. Urine drug screen negative  #4 history of aortic valve replacement for aortic stenosis on Coumadin  #5 type 2 diabetes complicated with neuropathy with an A1c of 7.1  Continue Jardiance  #6 stage IIIa CKD at baseline  #7 history of essential hypertension on Lasix 40 mg daily, hydralazine 25 mg 3 times a day, Imdur 60 mg daily, Aldactone 25 mg daily.  His blood pressure is running soft I have adjusted the dose of hydralazine and Imdur.  Estimated body mass index is 36.9 kg/m as calculated from the following:   Height as of this encounter: 5\' 11"  (1.803 m).   Weight as of this encounter: 120 kg.  DVT prophylaxis:coumadin Code Status: full Family Communication: His brother was on the phone while I was in the room with the patient Disposition Plan:  Status is: Inpatient  Dispo: The patient is from: Home              Anticipated d/c is to: Home              Patient currently is not medically stable to  d/c.   Difficult to place patient No    Consultants:   None  Procedures: None Antimicrobials none  Subjective: He was working with OT and was struggling to stand up even for few seconds.  Objective: Vitals:   05/08/21 2111 05/09/21 0629 05/09/21 0640 05/09/21 1038  BP: 116/67 (!) 104/57  113/87  Pulse: 61 68  (!) 59  Resp: 16  16   Temp: 98.7 F (37.1 C) 99.6 F (37.6 C)  98.6 F (37 C)  TempSrc: Oral Oral  Oral  SpO2: 94%   96%  Weight:      Height:        Intake/Output Summary (Last 24 hours) at 05/09/2021 1309 Last data  filed at 05/09/2021 1104 Gross per 24 hour  Intake 240 ml  Output 2200 ml  Net -1960 ml   Filed Weights   05/06/21 0137 05/06/21 1610  Weight: 115.7 kg 120 kg    Examination:  General exam: Appears calm and comfortable  Respiratory system: Scattered rhonchi to auscultation. Respiratory effort normal. Cardiovascular system: S1 & S2 heard, RRR. No JVD, murmurs, rubs, gallops or clicks. No pedal edema. Gastrointestinal system: Abdomen is nondistended, soft and nontender. No organomegaly or masses felt. Normal bowel sounds heard. Central nervous system: Alert and oriented. No focal neurological deficits. Extremities: Trace bilateral pitting edema, tender to touch both feet Skin: No rashes, lesions or ulcers Psychiatry: Judgement and insight appear normal. Mood & affect appropriate.     Data Reviewed: I have personally reviewed following labs and imaging studies  CBC: Recent Labs  Lab 05/02/21 2033 05/05/21 1600 05/06/21 1321  WBC 11.3* 12.6* 11.3*  NEUTROABS  --  10.2*  --   HGB 15.1 14.2 14.1  HCT 47.0 44.9 42.7  MCV 86.1 87.5 86.6  PLT 249 260 [id number]   Basic Metabolic Panel: Recent Labs  Lab 05/02/21 2033 05/05/21 1600 05/06/21 1321  NA 135 134* 133*  K 4.2 4.8 4.8  CL 103 100 99  CO2 24 24 24   GLUCOSE 126* 116* 131*  BUN 19 23* 28*  CREATININE 1.70* 1.66* 1.69*  CALCIUM 9.3 9.2 8.8*   GFR: Estimated Creatinine Clearance: 61.3 mL/min (A) (by C-G formula based on SCr of 1.69 mg/dL (H)). Liver Function Tests: Recent Labs  Lab 05/05/21 1600 05/06/21 1321  AST 25 23  ALT 21 20  ALKPHOS 93 91  BILITOT 2.1* 1.4*  PROT 7.5 7.0  ALBUMIN 3.3* 3.0*   No results for input(s): LIPASE, AMYLASE in the last 168 hours. No results for input(s): AMMONIA in the last 168 hours. Coagulation Profile: Recent Labs  Lab 05/05/21 2023 05/06/21 1340 05/07/21 0228 05/08/21 0148 05/09/21 0434  INR 2.5* 3.0* 3.1* 2.7* 2.1*   Cardiac Enzymes: Recent Labs  Lab  05/02/21 2033  CKTOTAL 206   BNP (last 3 results) No results for input(s): PROBNP in the last 8760 hours. HbA1C: No results for input(s): HGBA1C in the last 72 hours. CBG: Recent Labs  Lab 05/07/21 2028 05/08/21 0636 05/08/21 2111 05/09/21 0653 05/09/21 1148  GLUCAP 115* 113* 101* 120* 125*   Lipid Profile: No results for input(s): CHOL, HDL, LDLCALC, TRIG, CHOLHDL, LDLDIRECT in the last 72 hours. Thyroid Function Tests: No results for input(s): TSH, T4TOTAL, FREET4, T3FREE, THYROIDAB in the last 72 hours. Anemia Panel: No results for input(s): VITAMINB12, FOLATE, FERRITIN, TIBC, IRON, RETICCTPCT in the last 72 hours. Sepsis Labs: Recent Labs  Lab 05/03/21 0321 05/05/21 2023  LATICACIDVEN 1.0 1.4    Recent  Results (from the past 240 hour(s))  Resp Panel by RT-PCR (Flu A&B, Covid) Nasopharyngeal Swab     Status: None   Collection Time: 05/03/21  3:47 AM   Specimen: Nasopharyngeal Swab; Nasopharyngeal(NP) swabs in vial transport medium  Result Value Ref Range Status   SARS Coronavirus 2 by RT PCR NEGATIVE NEGATIVE Final    Comment: (NOTE) SARS-CoV-2 target nucleic acids are NOT DETECTED.  The SARS-CoV-2 RNA is generally detectable in upper respiratory specimens during the acute phase of infection. The lowest concentration of SARS-CoV-2 viral copies this assay can detect is 138 copies/mL. A negative result does not preclude SARS-Cov-2 infection and should not be used as the sole basis for treatment or other patient management decisions. A negative result may occur with  improper specimen collection/handling, submission of specimen other than nasopharyngeal swab, presence of viral mutation(s) within the areas targeted by this assay, and inadequate number of viral copies(<138 copies/mL). A negative result must be combined with clinical observations, patient history, and epidemiological information. The expected result is Negative.  Fact Sheet for Patients:   EntrepreneurPulse.com.au  Fact Sheet for Healthcare Providers:  IncredibleEmployment.be  This test is no t yet approved or cleared by the [place] FDA and  has been authorized for detection and/or diagnosis of SARS-CoV-2 by FDA under an Emergency Use Authorization (EUA). This EUA will remain  in effect (meaning this test can be used) for the duration of the COVID-19 declaration under Section 564(b)(1) of the Act, 21 U.S.C.section 360bbb-3(b)(1), unless the authorization is terminated  or revoked sooner.       Influenza A by PCR NEGATIVE NEGATIVE Final   Influenza B by PCR NEGATIVE NEGATIVE Final    Comment: (NOTE) The Xpert Xpress SARS-CoV-2/FLU/RSV plus assay is intended as an aid in the diagnosis of influenza from Nasopharyngeal swab specimens and should not be used as a sole basis for treatment. Nasal washings and aspirates are unacceptable for Xpert Xpress SARS-CoV-2/FLU/RSV testing.  Fact Sheet for Patients: EntrepreneurPulse.com.au  Fact Sheet for Healthcare Providers: IncredibleEmployment.be  This test is not yet approved or cleared by the [place] FDA and has been authorized for detection and/or diagnosis of SARS-CoV-2 by FDA under an Emergency Use Authorization (EUA). This EUA will remain in effect (meaning this test can be used) for the duration of the COVID-19 declaration under Section 564(b)(1) of the Act, 21 U.S.C. section 360bbb-3(b)(1), unless the authorization is terminated or revoked.  Performed at [facility], [place] [address] [place], [place] [address]   Blood culture (routine x 2)     Status: None   Collection Time: 05/03/21  4:15 AM   Specimen: BLOOD  Result Value Ref Range Status   Specimen Description BLOOD SITE NOT SPECIFIED  Final   Special Requests   Final    BOTTLES DRAWN AEROBIC AND ANAEROBIC Blood Culture results may not be optimal due to an inadequate  volume of blood received in culture bottles   Culture   Final    NO GROWTH 5 DAYS Performed at [facility], [place] [address], [place], [place] [address]    Report Status 05/08/2021 FINAL  Final  Blood culture (routine x 2)     Status: None   Collection Time: 05/03/21  4:17 AM   Specimen: BLOOD  Result Value Ref Range Status   Specimen Description BLOOD SITE NOT SPECIFIED  Final   Special Requests   Final    BOTTLES DRAWN AEROBIC AND ANAEROBIC Blood Culture results may  not be optimal due to an inadequate volume of blood received in culture bottles   Culture   Final    NO GROWTH 5 DAYS Performed at [facility], [place] [address], [place], [place] [address]    Report Status 05/08/2021 FINAL  Final  Resp Panel by RT-PCR (Flu A&B, Covid) Nasopharyngeal Swab     Status: None   Collection Time: 05/05/21  8:24 PM   Specimen: Nasopharyngeal Swab; Nasopharyngeal(NP) swabs in vial transport medium  Result Value Ref Range Status   SARS Coronavirus 2 by RT PCR NEGATIVE NEGATIVE Final    Comment: (NOTE) SARS-CoV-2 target nucleic acids are NOT DETECTED.  The SARS-CoV-2 RNA is generally detectable in upper respiratory specimens during the acute phase of infection. The lowest concentration of SARS-CoV-2 viral copies this assay can detect is 138 copies/mL. A negative result does not preclude SARS-Cov-2 infection and should not be used as the sole basis for treatment or other patient management decisions. A negative result may occur with  improper specimen collection/handling, submission of specimen other than nasopharyngeal swab, presence of viral mutation(s) within the areas targeted by this assay, and inadequate number of viral copies(<138 copies/mL). A negative result must be combined with clinical observations, patient history, and epidemiological information. The expected result is Negative.  Fact Sheet for Patients:  EntrepreneurPulse.com.au  Fact Sheet for  Healthcare Providers:  IncredibleEmployment.be  This test is no t yet approved or cleared by the [place] FDA and  has been authorized for detection and/or diagnosis of SARS-CoV-2 by FDA under an Emergency Use Authorization (EUA). This EUA will remain  in effect (meaning this test can be used) for the duration of the COVID-19 declaration under Section 564(b)(1) of the Act, 21 U.S.C.section 360bbb-3(b)(1), unless the authorization is terminated  or revoked sooner.       Influenza A by PCR NEGATIVE NEGATIVE Final   Influenza B by PCR NEGATIVE NEGATIVE Final    Comment: (NOTE) The Xpert Xpress SARS-CoV-2/FLU/RSV plus assay is intended as an aid in the diagnosis of influenza from Nasopharyngeal swab specimens and should not be used as a sole basis for treatment. Nasal washings and aspirates are unacceptable for Xpert Xpress SARS-CoV-2/FLU/RSV testing.  Fact Sheet for Patients: EntrepreneurPulse.com.au  Fact Sheet for Healthcare Providers: IncredibleEmployment.be  This test is not yet approved or cleared by the [place] FDA and has been authorized for detection and/or diagnosis of SARS-CoV-2 by FDA under an Emergency Use Authorization (EUA). This EUA will remain in effect (meaning this test can be used) for the duration of the COVID-19 declaration under Section 564(b)(1) of the Act, 21 U.S.C. section 360bbb-3(b)(1), unless the authorization is terminated or revoked.  Performed at [facility], [place] [address], [place], [place] [address]   Culture, blood (routine x 2)     Status: None (Preliminary result)   Collection Time: 05/05/21  8:40 PM   Specimen: BLOOD  Result Value Ref Range Status   Specimen Description BLOOD RIGHT ANTECUBITAL  Final   Special Requests   Final    BOTTLES DRAWN AEROBIC AND ANAEROBIC Blood Culture adequate volume   Culture   Final    NO GROWTH 3 DAYS Performed at [facility], [place] [address], [place],  [address]    Report Status PENDING  Incomplete  Culture, blood (routine x 2)     Status: None (Preliminary result)   Collection Time: 05/05/21  8:45 PM   Specimen: BLOOD RIGHT HAND  Result  Value Ref Range Status   Specimen Description BLOOD RIGHT HAND  Final   Special Requests   Final    BOTTLES DRAWN AEROBIC AND ANAEROBIC Blood Culture adequate volume   Culture   Final    NO GROWTH 3 DAYS Performed at [facility], [address] [place], [place] [address]    Report Status PENDING  Incomplete  Urine culture     Status: None   Collection Time: 05/05/21 10:36 PM   Specimen: Urine, Random  Result Value Ref Range Status   Specimen Description URINE, RANDOM  Final   Special Requests NONE  Final   Culture   Final    NO GROWTH Performed at [facility], [place] [address], [place], [place] [address]    Report Status 05/07/2021 FINAL  Final  MRSA PCR Screening     Status: None   Collection Time: 05/06/21  8:01 PM   Specimen: Nasopharyngeal  Result Value Ref Range Status   MRSA by PCR NEGATIVE NEGATIVE Final    Comment:        The GeneXpert MRSA Assay (FDA approved for NASAL specimens only), is one component of a comprehensive MRSA colonization surveillance program. It is not intended to diagnose MRSA infection nor to guide or monitor treatment for MRSA infections. Performed at [facility], [place] [address] [place], [place] [address]          Radiology Studies: DG Wrist 2 Views Right  Result Date: 05/08/2021 CLINICAL DATA:  Wrist pain that began yesterday uncertain etiology. Affected grip strength. EXAM: RIGHT WRIST - 2 VIEW COMPARISON:  May 15, 2019 FINDINGS: There is no evidence of fracture or dislocation. There is no evidence of arthropathy or other focal bone abnormality. IMPRESSION: Negative. Electronically Signed   By: Zetta Bills M.D.   On: 05/08/2021 09:27   ECHOCARDIOGRAM COMPLETE  Result Date: 05/07/2021     ECHOCARDIOGRAM REPORT   Patient Name:   REON HUNLEY Date of Exam: 05/07/2021 Medical Rec #:  [id number]         Height:       71.0 in Accession #:    [id number]        Weight:       264.6 lb Date of Birth:  Aug 21, 1961          BSA:          2.375 m Patient Age:    80 years          BP:           101/76 mmHg Patient Gender: M                 HR:           66 bpm. Exam Location:  Inpatient Procedure: 2D Echo, 3D Echo, Color Doppler, Cardiac Doppler and Intracardiac            Opacification Agent Indications:    Fever R50.9  History:        Patient has prior history of Echocardiogram examinations, most                 recent 01/20/2021. CHF, Arrythmias:Atrial Fibrillation; Risk                 Factors:Hypertension, Dyslipidemia and Diabetes. Chronic kidney                 disease.  Aortic Valve: 27 mm [facility] Ease valve is present in the                 aortic position. Procedure Date: 04/05/2015.  Sonographer:    Darlina Sicilian RDCS Referring Phys: [id number] [place]  1. No apical thrombus. Left ventricular ejection fraction, by estimation, is 20 to 25%. The left ventricle has severely decreased function. The left ventricle demonstrates global hypokinesis. The left ventricular internal cavity size was moderately dilated. There is moderate left ventricular hypertrophy. Left ventricular diastolic function could not be evaluated.  2. Right ventricular systolic function is mildly reduced. The right ventricular size is mildly enlarged. There is moderately elevated pulmonary artery systolic pressure.  3. Left atrial size was moderately dilated.  4. The mitral valve is abnormal. Mild mitral valve regurgitation.  5. The aortic valve has been repaired/replaced. Aortic valve regurgitation is mild. There is a 27 mm [facility] Ease valve present in the aortic position. Procedure Date: 04/05/2015. Aortic valve mean gradient measures 12.0 mmHg. DI is 0.32  6. The inferior vena cava  is dilated in size with <50% respiratory variability, suggesting right atrial pressure of 15 mmHg. Comparison(s): Changes from prior study are noted. 01/20/2021: LVEF 20-25%, global hypokinesis, 27 mm [employer] MagnaEase valve, mean gradient 8 mmHg. Conclusion(s)/Recommendation(s): No evidence of valvular vegetations on this transthoracic echocardiogram, although the gradient across the bioprosthetic aortic valve has increased. Would recommend a transesophageal echocardiogram to exclude infective endocarditis if clinically indicated. FINDINGS  Left Ventricle: No apical thrombus. Left ventricular ejection fraction, by estimation, is 20 to 25%. The left ventricle has severely decreased function. The left ventricle demonstrates global hypokinesis. Definity contrast agent was given IV to delineate the left ventricular endocardial borders. The left ventricular internal cavity size was moderately dilated. There is moderate left ventricular hypertrophy. Left ventricular diastolic function could not be evaluated due to atrial fibrillation. Left ventricular diastolic function could not be evaluated. Right Ventricle: The right ventricular size is mildly enlarged. No increase in right ventricular wall thickness. Right ventricular systolic function is mildly reduced. There is moderately elevated pulmonary artery systolic pressure. The tricuspid regurgitant velocity is 2.91 m/s, and with an assumed right atrial pressure of 15 mmHg, the estimated right ventricular systolic pressure is [id number] mmHg. Left Atrium: Left atrial size was moderately dilated. Right Atrium: Right atrial size was normal in size. Pericardium: There is no evidence of pericardial effusion. Mitral Valve: The mitral valve is abnormal. There is mild thickening of the mitral valve leaflet(s). Mild mitral valve regurgitation. Tricuspid Valve: The tricuspid valve is grossly normal. Tricuspid valve regurgitation is mild. Aortic Valve: The aortic valve has been  repaired/replaced. Aortic valve regurgitation is mild. Aortic valve mean gradient measures 12.0 mmHg. Aortic valve peak gradient measures 23.6 mmHg. There is a 27 mm [facility] Ease valve present in the aortic position. Procedure Date: 04/05/2015. Pulmonic Valve: The pulmonic valve was grossly normal. Pulmonic valve regurgitation is trivial. Aorta: The aortic root and ascending aorta are structurally normal, with no evidence of dilitation. Venous: The inferior vena cava is dilated in size with less than 50% respiratory variability, suggesting right atrial pressure of 15 mmHg. IAS/Shunts: No atrial level shunt detected by color flow Doppler.  LEFT VENTRICLE PLAX 2D LVIDd:         6.40 cm LVIDs:         4.85 cm LV PW:         1.70 cm LV IVS:  1.30 cm                        3D Volume EF:                        3D EF:        24 %                        LV EDV:       397 ml                        LV ESV:       302 ml                        LV SV:        95 ml RIGHT VENTRICLE RV S prime:     6.83 cm/s TAPSE (M-mode): 1.2 cm LEFT ATRIUM              Index       RIGHT ATRIUM           Index LA diam:        5.25 cm  2.21 cm/m  RA Area:     24.60 cm LA Vol (A2C):   106.0 ml 44.64 ml/m RA Volume:   70.30 ml  29.60 ml/m LA Vol (A4C):   112.0 ml 47.16 ml/m LA Biplane Vol: 113.0 ml 47.58 ml/m  AORTIC VALVE AV Vmax:           242.80 cm/s AV Vmean:          154.400 cm/s AV VTI:            0.404 m AV Peak Grad:      23.6 mmHg AV Mean Grad:      12.0 mmHg LVOT Vmax:         80.53 cm/s LVOT Vmean:        50.325 cm/s LVOT VTI:          0.129 m LVOT/AV VTI ratio: 0.32  AORTA Ao Asc diam: 3.10 cm MITRAL VALVE                TRICUSPID VALVE MV Area (PHT): 6.83 cm     TR Peak grad:   33.9 mmHg MV Decel Time: 111 msec     TR Vmax:        291.00 cm/s MV E velocity: 101.60 cm/s                             SHUNTS                             Systemic VTI: 0.13 m Lyman Bishop MD Electronically signed by Lyman Bishop MD  Signature Date/Time: 05/07/2021/4:35:54 PM    Final         Scheduled Meds: . colchicine  0.6 mg Oral Daily  . empagliflozin  10 mg Oral QAC breakfast  . furosemide  40 mg Oral Daily  . gabapentin  200 mg Oral TID  . hydrALAZINE  10 mg Oral Q8H  . isosorbide mononitrate  30 mg Oral Daily  . predniSONE  20 mg Oral Q breakfast  . sacubitril-valsartan  1 tablet Oral BID  . spironolactone  25 mg Oral Daily  . warfarin  10 mg Oral Once per day on Mon Wed Fri  . warfarin  7.5 mg Oral Once per day on Sun Tue Thu Sat  . Warfarin - Pharmacist Dosing Inpatient   Does not apply q1600   Continuous Infusions:   LOS: 0 days    Georgette Shell, MD 05/09/2021, 1:09 PM

## 2021-05-09 NOTE — Progress Notes (Signed)
ANTICOAGULATION CONSULT NOTE - Follow Up Consult  Pharmacy Consult for Coumadin Indication: hx aflutter, LV thrombus, bioprosthetic AVR  No Known Allergies  Patient Measurements: Height: 5\' 11"  (180.3 cm) Weight: 120 kg (264 lb 8.8 oz) IBW/kg (Calculated) : 75.3  Vital Signs: Temp: 99.6 F (37.6 C) (05/23 0629) Temp Source: Oral (05/23 0629) BP: 104/57 (05/23 0629) Pulse Rate: 68 (05/23 0629)  Labs: Recent Labs    05/06/21 1321 05/06/21 1340 05/06/21 1630 05/07/21 0228 05/08/21 0148 05/09/21 0434  HGB 14.1  --   --   --   --   --   HCT 42.7  --   --   --   --   --   PLT 285  --   --   --   --   --   LABPROT  --    < >  --  31.7* 28.4* 23.2*  INR  --    < >  --  3.1* 2.7* 2.1*  CREATININE 1.69*  --   --   --   --   --   TROPONINIHS 55*  --  62*  --   --   --    < > = values in this interval not displayed.    Estimated Creatinine Clearance: 61.3 mL/min (A) (by C-G formula based on SCr of 1.69 mg/dL (H)).   Assessment: Anticoag: Warfarin pta for hx aflutter, LV thrombus, bioprosthetic AVR. INR down to 2.1. Dopplers negative for new DVT.  -PTA dose Coumadin10 mg MWF, 7.5 mg TTSS with admit INR 2.5  We will continue with the home dose for now since it was just restarted and missed dose on 5/21.  Goal of Therapy:  INR 2-3 Monitor platelets by anticoagulation protocol: Yes   Plan:  Resume home Coumadin regimen Daily INR.   Onnie Boer, PharmD, BCIDP, AAHIVP, CPP Infectious Disease Pharmacist 05/09/2021 8:50 AM

## 2021-05-09 NOTE — Progress Notes (Signed)
Nutrition Brief Note  Patient identified on the Malnutrition Screening Tool (MST) Report  Wt Readings from Last 15 Encounters:  05/06/21 120 kg  04/27/21 120.7 kg  02/24/21 123.4 kg  02/09/21 121.7 kg  01/17/21 120.9 kg  01/13/21 120.3 kg  01/11/21 121.6 kg  01/10/21 124.3 kg  08/03/20 122.5 kg  04/06/20 126.7 kg  11/05/19 118.8 kg  10/15/19 123.6 kg  08/20/19 120.4 kg  08/06/19 117 kg  07/11/19 120.7 kg   [name] is a 60 y.o. 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 Reports foot pain has become increasingly worse, making ambulation difficult. Denies radiation into legs. Denies ulcerations of feet. Also endorsing productive cough x3 weeks.  Pt admitted with bilateral foot pain and CHF.  Reviewed I/O's: -1.6 L x 24 hours and -3 L since admission  UOP: 2.1 L x 24 hours  Spoke with pt at bedside, who reports pt with increased appetite since admission. He consumed all of his lunch tray. Noted meal completions 25-100%.   Per pt, he has been making modifications to his diet over the past few months secondary to CHF and DM. He reports eating mostly fruits, vegetables, protein shakes, and water. He has been cutting back on carbohydrates. He reports he is trying to reduce condiments at home as well- he shares with this RD that he ate too much sour cream on his shrimp tacos PTA. 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   Pt reports his UBW is around 260#. Reviewed wt hx; pt has experienced a 3.5% wt loss over the past 4 months, which is not significant for time frame.   Per pt, he was just diagnosed with DM last week and reports he was never educated on in it.   RD provided "Carbohydrate Counting for People with Diabetes" handout from the Academy of [employer]. Discussed different food groups  and their effects on blood sugar, emphasizing carbohydrate-containing foods. Provided list of carbohydrates and recommended serving sizes of common foods.  Discussed importance of controlled and consistent carbohydrate intake throughout the day. Provided examples of ways to balance meals/snacks and encouraged intake of high-fiber, whole grain complex carbohydrates. Teach back method used.  RD also referred to [facility] Nutrition and Diabetes Education Services for further support.   Nutrition-Focused physical exam completed. Findings are no fat depletion, no muscle depletion, and mild edema.   Medications reviewed and include lasix and prednisone.  Lab Results  Component Value Date   HGBA1C 7.1 (H) 05/06/2021   PTA DM medications are 10 mg jardiance daily.   Labs reviewed: CBGS: 120-125 (inpatient orders for glycemic control are 10 mg jardiance daily).   Body mass index is 36.9 kg/m. Patient meets criteria for obesity, class II based on current BMI.   Current diet order is heart healthy/ carb modified, patient is consuming approximately 100% of meals at this time. Labs and medications reviewed.   No nutrition interventions warranted at this time. If nutrition issues arise, please consult RD.   Loistine Chance, RD, LDN, [place] Registered Dietitian II Certified Diabetes Care and Education Specialist Please refer to [facility] for RD and/or RD on-call/weekend/after hours pager

## 2021-05-10 ENCOUNTER — Inpatient Hospital Stay (HOSPITAL_COMMUNITY): Payer: Medicare (Managed Care)

## 2021-05-10 DIAGNOSIS — N182 Chronic kidney disease, stage 2 (mild): Secondary | ICD-10-CM | POA: Diagnosis not present

## 2021-05-10 DIAGNOSIS — R61 Generalized hyperhidrosis: Secondary | ICD-10-CM

## 2021-05-10 DIAGNOSIS — M79671 Pain in right foot: Secondary | ICD-10-CM

## 2021-05-10 DIAGNOSIS — I420 Dilated cardiomyopathy: Secondary | ICD-10-CM | POA: Diagnosis not present

## 2021-05-10 DIAGNOSIS — R509 Fever, unspecified: Secondary | ICD-10-CM | POA: Diagnosis not present

## 2021-05-10 DIAGNOSIS — M79672 Pain in left foot: Secondary | ICD-10-CM

## 2021-05-10 DIAGNOSIS — I10 Essential (primary) hypertension: Secondary | ICD-10-CM | POA: Diagnosis not present

## 2021-05-10 DIAGNOSIS — M109 Gout, unspecified: Principal | ICD-10-CM

## 2021-05-10 DIAGNOSIS — G8929 Other chronic pain: Secondary | ICD-10-CM

## 2021-05-10 LAB — CBC
HCT: 41.7 % (ref 39.0–52.0)
Hemoglobin: 13.8 g/dL (ref 13.0–17.0)
MCH: 28.1 pg (ref 26.0–34.0)
MCHC: 33.1 g/dL (ref 30.0–36.0)
MCV: 84.9 fL (ref 80.0–100.0)
Platelets: 369 10*3/uL (ref 150–400)
RBC: 4.91 MIL/uL (ref 4.22–5.81)
RDW: 14.1 % (ref 11.5–15.5)
WBC: 12.7 10*3/uL — ABNORMAL HIGH (ref 4.0–10.5)
nRBC: 0 % (ref 0.0–0.2)

## 2021-05-10 LAB — PROTIME-INR
INR: 2.3 — ABNORMAL HIGH (ref 0.8–1.2)
Prothrombin Time: 24.9 seconds — ABNORMAL HIGH (ref 11.4–15.2)

## 2021-05-10 LAB — COMPREHENSIVE METABOLIC PANEL
ALT: 18 U/L (ref 0–44)
AST: 13 U/L — ABNORMAL LOW (ref 15–41)
Albumin: 3 g/dL — ABNORMAL LOW (ref 3.5–5.0)
Alkaline Phosphatase: 88 U/L (ref 38–126)
Anion gap: 8 (ref 5–15)
BUN: 26 mg/dL — ABNORMAL HIGH (ref 6–20)
CO2: 24 mmol/L (ref 22–32)
Calcium: 9.5 mg/dL (ref 8.9–10.3)
Chloride: 102 mmol/L (ref 98–111)
Creatinine, Ser: 1.4 mg/dL — ABNORMAL HIGH (ref 0.61–1.24)
GFR, Estimated: 58 mL/min — ABNORMAL LOW (ref >60)
Glucose, Bld: 143 mg/dL — ABNORMAL HIGH (ref 70–99)
Potassium: 4.4 mmol/L (ref 3.5–5.1)
Sodium: 134 mmol/L — ABNORMAL LOW (ref 135–145)
Total Bilirubin: 1.1 mg/dL (ref 0.3–1.2)
Total Protein: 7 g/dL (ref 6.5–8.1)

## 2021-05-10 LAB — GLUCOSE, CAPILLARY
Glucose-Capillary: 120 mg/dL — ABNORMAL HIGH (ref 70–99)
Glucose-Capillary: 194 mg/dL — ABNORMAL HIGH (ref 70–99)

## 2021-05-10 LAB — C-REACTIVE PROTEIN: CRP: 6.5 mg/dL — ABNORMAL HIGH (ref <1.0)

## 2021-05-10 LAB — SEDIMENTATION RATE: Sed Rate: 59 mm/hr — ABNORMAL HIGH (ref 0–16)

## 2021-05-10 NOTE — Progress Notes (Signed)
ANTICOAGULATION CONSULT NOTE - Follow Up Consult  Pharmacy Consult for Coumadin Indication: hx aflutter, LV thrombus, bioprosthetic AVR  No Known Allergies  Patient Measurements: Height: 5\' 11"  (180.3 cm) Weight: 120 kg (264 lb 8.8 oz) IBW/kg (Calculated) : 75.3  Vital Signs: Temp: 98 F (36.7 C) (05/24 0354) Temp Source: Oral (05/24 0354) BP: 98/63 (05/24 0354) Pulse Rate: 59 (05/24 0354)  Labs: Recent Labs    05/08/21 0148 05/09/21 0434 05/10/21 0359  HGB  --   --  13.8  HCT  --   --  41.7  PLT  --   --  369  LABPROT 28.4* 23.2* 24.9*  INR 2.7* 2.1* 2.3*  CREATININE  --   --  1.40*    Estimated Creatinine Clearance: 74 mL/min (A) (by C-G formula based on SCr of 1.4 mg/dL (H)).   Assessment: Anticoag: Warfarin pta for hx aflutter, LV thrombus, bioprosthetic AVR. Dopplers negative for new DVT.  -PTA dose Coumadin10 mg MWF, 7.5 mg TTSS with admit INR 2.5  INR continues to be therapeutic at 2.3. We will continue on the home dose and probably can change INR monitoring to MWF soon.   Goal of Therapy:  INR 2-3 Monitor platelets by anticoagulation protocol: Yes   Plan:  Coumadin 7.5mg  qday except 10mg  MWF Daily INR.   Onnie Boer, PharmD, BCIDP, AAHIVP, CPP Infectious Disease Pharmacist 05/10/2021 9:34 AM

## 2021-05-10 NOTE — Progress Notes (Signed)
PROGRESS NOTE    ANUEL SITTER  HDQ:[id number] DOB: [birth year] DOA: 05/05/2021 PCP: Flossie Buffy, NP   Brief Narrative: 60 year old male diabetes AVR on Coumadin history of hypertension and congestive heart failure admitted with complaints of bilateral foot pain and swelling.  He also reports a productive cough. He denies chest pain does report shortness of breath.  Assessment & Plan: bilateral foot pain  Likely gout and a combination of neuropathy. MRI negative for any acute abnormality including septic arthritis. X-ray negative.  Doppler negative. Patient reports pain while ambulating therefore we will check ABI. Continue colchicine and prednisone as well as Neurontin.  Right wrist pain. Range of motion limited. Suspect this could be also secondary to gout. Monitor for now on response to treatment. May require further work-up although x-ray was negative.  Fever of unknown origin. Drenching night sweats Initially presented with a temp of 101.5. CT chest negative for any acute abnormality no lymphadenopathy. Received oral azithromycin to complete treatment for bronchitis/pharyngitis. ID consulted due to persistent fever although currently afebrile. Not sure whether further work-up is indicated for now.  Will follow ID recommendation.  acute on chronic systolic heart failure Elevated troponin HTN he was admitted with shortness of breath.  His last echo from February 2022 with ejection fraction 25 to 25%. Exam with trace bilateral pitting edema, decreased breath sounds at the bases chest x-rays with bibasilar atelectasis. BNP 1072 Continue Lasix 40 mg daily. Troponin peaked at 84. Troponin mildly elevated likely from demand ischemia Continue Lasix Entresto and Aldactone CT chest-mild scarring in bases bilaterally with stable cardiomegaly no acute abnormality. Urine drug screen negative  history of aortic valve replacement for aortic stenosis on Coumadin  type 2  diabetes complicated with neuropathy  with an A1c of 7.1  Continue Jardiance  stage IIIa CKD  Renal function at baseline  Obesity. Placing the patient at high risk for poor outcome. Body mass index is 36.9 kg/m.   DVT prophylaxis:coumadin Code Status: full Family Communication: No family at bedside. Disposition Plan:  Status is: Inpatient  Dispo: The patient is from: Home              Anticipated d/c is to: Home              Patient currently is not medically stable to d/c.   Difficult to place patient No    Consultants:   None  Procedures: None Antimicrobials none  Subjective:  No nausea no vomiting but no fever no chills.  No acute complaint. Continues to have right wrist pain as well as bilateral leg pain. Pain described as pins-and-needles type.  Mild.  Objective: Vitals:   05/09/21 2111 05/10/21 0354 05/10/21 0945 05/10/21 1748  BP: 102/64 98/63 107/82 113/89  Pulse: (!) 57 (!) 59 69 70  Resp: 17 16 18 20   Temp: 98.3 F (36.8 C) 98 F (36.7 C) 98.3 F (36.8 C) 98 F (36.7 C)  TempSrc: Oral Oral Oral Oral  SpO2: 99% 97% 93% 96%  Weight:      Height:        Intake/Output Summary (Last 24 hours) at 05/10/2021 1955 Last data filed at 05/10/2021 1700 Gross per 24 hour  Intake 960 ml  Output 1575 ml  Net -615 ml   Filed Weights   05/06/21 0137 05/06/21 1610  Weight: 115.7 kg 120 kg    Examination:  General: Appear in mild distress, no Rash; Oral Mucosa Clear, moist. no Abnormal Neck Mass Or lumps, Conjunctiva  normal  Cardiovascular: S1 and S2 Present, no Murmur, Respiratory: good respiratory effort, Bilateral Air entry present and CTA, no Crackles, no wheezes Abdomen: Bowel Sound present, Soft and no tenderness Extremities: no Pedal edema Neurology: alert and oriented to time, place, and person affect appropriate. no new focal deficit Gait not checked due to patient safety concerns   Data Reviewed: I have personally reviewed following labs and  imaging studies  CBC: Recent Labs  Lab 05/05/21 1600 05/06/21 1321 05/10/21 0359  WBC 12.6* 11.3* 12.7*  NEUTROABS 10.2*  --   --   HGB 14.2 14.1 13.8  HCT 44.9 42.7 41.7  MCV 87.5 86.6 84.9  PLT 260 285 350   Basic Metabolic Panel: Recent Labs  Lab 05/05/21 1600 05/06/21 1321 05/10/21 0359  NA 134* 133* 134*  K 4.8 4.8 4.4  CL 100 99 102  CO2 24 24 24   GLUCOSE 116* 131* 143*  BUN 23* 28* 26*  CREATININE 1.66* 1.69* 1.40*  CALCIUM 9.2 8.8* 9.5   GFR: Estimated Creatinine Clearance: 74 mL/min (A) (by C-G formula based on SCr of 1.4 mg/dL (H)). Liver Function Tests: Recent Labs  Lab 05/05/21 1600 05/06/21 1321 05/10/21 0359  AST 25 23 13*  ALT 21 20 18   ALKPHOS 93 91 88  BILITOT 2.1* 1.4* 1.1  PROT 7.5 7.0 7.0  ALBUMIN 3.3* 3.0* 3.0*   No results for input(s): LIPASE, AMYLASE in the last 168 hours. No results for input(s): AMMONIA in the last 168 hours. Coagulation Profile: Recent Labs  Lab 05/06/21 1340 05/07/21 0228 05/08/21 0148 05/09/21 0434 05/10/21 0359  INR 3.0* 3.1* 2.7* 2.1* 2.3*   Cardiac Enzymes: No results for input(s): CKTOTAL, CKMB, CKMBINDEX, TROPONINI in the last 168 hours. BNP (last 3 results) No results for input(s): PROBNP in the last 8760 hours. HbA1C: No results for input(s): HGBA1C in the last 72 hours. CBG: Recent Labs  Lab 05/09/21 0653 05/09/21 1148 05/09/21 1644 05/09/21 2111 05/10/21 0646  GLUCAP 120* 125* 198* 146* 120*   Lipid Profile: No results for input(s): CHOL, HDL, LDLCALC, TRIG, CHOLHDL, LDLDIRECT in the last 72 hours. Thyroid Function Tests: No results for input(s): TSH, T4TOTAL, FREET4, T3FREE, THYROIDAB in the last 72 hours. Anemia Panel: No results for input(s): VITAMINB12, FOLATE, FERRITIN, TIBC, IRON, RETICCTPCT in the last 72 hours. Sepsis Labs: Recent Labs  Lab 05/05/21 2023  LATICACIDVEN 1.4    Recent Results (from the past 240 hour(s))  Resp Panel by RT-PCR (Flu A&B, Covid)  Nasopharyngeal Swab     Status: None   Collection Time: 05/03/21  3:47 AM   Specimen: Nasopharyngeal Swab; Nasopharyngeal(NP) swabs in vial transport medium  Result Value Ref Range Status   SARS Coronavirus 2 by RT PCR NEGATIVE NEGATIVE Final    Comment: (NOTE) SARS-CoV-2 target nucleic acids are NOT DETECTED.  The SARS-CoV-2 RNA is generally detectable in upper respiratory specimens during the acute phase of infection. The lowest concentration of SARS-CoV-2 viral copies this assay can detect is 138 copies/mL. A negative result does not preclude SARS-Cov-2 infection and should not be used as the sole basis for treatment or other patient management decisions. A negative result may occur with  improper specimen collection/handling, submission of specimen other than nasopharyngeal swab, presence of viral mutation(s) within the areas targeted by this assay, and inadequate number of viral copies(<138 copies/mL). A negative result must be combined with clinical observations, patient history, and epidemiological information. The expected result is Negative.  Fact Sheet for Patients:  EntrepreneurPulse.com.au  Fact Sheet for Healthcare Providers:  IncredibleEmployment.be  This test is no t yet approved or cleared by the [place] FDA and  has been authorized for detection and/or diagnosis of SARS-CoV-2 by FDA under an Emergency Use Authorization (EUA). This EUA will remain  in effect (meaning this test can be used) for the duration of the COVID-19 declaration under Section 564(b)(1) of the Act, 21 U.S.C.section 360bbb-3(b)(1), unless the authorization is terminated  or revoked sooner.       Influenza A by PCR NEGATIVE NEGATIVE Final   Influenza B by PCR NEGATIVE NEGATIVE Final    Comment: (NOTE) The Xpert Xpress SARS-CoV-2/FLU/RSV plus assay is intended as an aid in the diagnosis of influenza from Nasopharyngeal swab specimens and should not be  used as a sole basis for treatment. Nasal washings and aspirates are unacceptable for Xpert Xpress SARS-CoV-2/FLU/RSV testing.  Fact Sheet for Patients: EntrepreneurPulse.com.au  Fact Sheet for Healthcare Providers: IncredibleEmployment.be  This test is not yet approved or cleared by the [place] FDA and has been authorized for detection and/or diagnosis of SARS-CoV-2 by FDA under an Emergency Use Authorization (EUA). This EUA will remain in effect (meaning this test can be used) for the duration of the COVID-19 declaration under Section 564(b)(1) of the Act, 21 U.S.C. section 360bbb-3(b)(1), unless the authorization is terminated or revoked.  Performed at [facility], [place] [address] [place], [place] [address]   Blood culture (routine x 2)     Status: None   Collection Time: 05/03/21  4:15 AM   Specimen: BLOOD  Result Value Ref Range Status   Specimen Description BLOOD SITE NOT SPECIFIED  Final   Special Requests   Final    BOTTLES DRAWN AEROBIC AND ANAEROBIC Blood Culture results may not be optimal due to an inadequate volume of blood received in culture bottles   Culture   Final    NO GROWTH 5 DAYS Performed at [facility], [place] [address] [place], [place] [address]    Report Status 05/08/2021 FINAL  Final  Blood culture (routine x 2)     Status: None   Collection Time: 05/03/21  4:17 AM   Specimen: BLOOD  Result Value Ref Range Status   Specimen Description BLOOD SITE NOT SPECIFIED  Final   Special Requests   Final    BOTTLES DRAWN AEROBIC AND ANAEROBIC Blood Culture results may not be optimal due to an inadequate volume of blood received in culture bottles   Culture   Final    NO GROWTH 5 DAYS Performed at [facility], [place] [address], [place], [place] [address]    Report Status 05/08/2021 FINAL  Final  Resp Panel by RT-PCR (Flu A&B, Covid) Nasopharyngeal Swab     Status: None   Collection Time: 05/05/21   8:24 PM   Specimen: Nasopharyngeal Swab; Nasopharyngeal(NP) swabs in vial transport medium  Result Value Ref Range Status   SARS Coronavirus 2 by RT PCR NEGATIVE NEGATIVE Final    Comment: (NOTE) SARS-CoV-2 target nucleic acids are NOT DETECTED.  The SARS-CoV-2 RNA is generally detectable in upper respiratory specimens during the acute phase of infection. The lowest concentration of SARS-CoV-2 viral copies this assay can detect is 138 copies/mL. A negative result does not preclude SARS-Cov-2 infection and should not be used as the sole basis for treatment or other patient management decisions. A negative result may occur with  improper specimen collection/handling, submission of specimen other than nasopharyngeal swab, presence of  viral mutation(s) within the areas targeted by this assay, and inadequate number of viral copies(<138 copies/mL). A negative result must be combined with clinical observations, patient history, and epidemiological information. The expected result is Negative.  Fact Sheet for Patients:  EntrepreneurPulse.com.au  Fact Sheet for Healthcare Providers:  IncredibleEmployment.be  This test is no t yet approved or cleared by the [place] FDA and  has been authorized for detection and/or diagnosis of SARS-CoV-2 by FDA under an Emergency Use Authorization (EUA). This EUA will remain  in effect (meaning this test can be used) for the duration of the COVID-19 declaration under Section 564(b)(1) of the Act, 21 U.S.C.section 360bbb-3(b)(1), unless the authorization is terminated  or revoked sooner.       Influenza A by PCR NEGATIVE NEGATIVE Final   Influenza B by PCR NEGATIVE NEGATIVE Final    Comment: (NOTE) The Xpert Xpress SARS-CoV-2/FLU/RSV plus assay is intended as an aid in the diagnosis of influenza from Nasopharyngeal swab specimens and should not be used as a sole basis for treatment. Nasal washings and aspirates  are unacceptable for Xpert Xpress SARS-CoV-2/FLU/RSV testing.  Fact Sheet for Patients: EntrepreneurPulse.com.au  Fact Sheet for Healthcare Providers: IncredibleEmployment.be  This test is not yet approved or cleared by the [place] FDA and has been authorized for detection and/or diagnosis of SARS-CoV-2 by FDA under an Emergency Use Authorization (EUA). This EUA will remain in effect (meaning this test can be used) for the duration of the COVID-19 declaration under Section 564(b)(1) of the Act, 21 U.S.C. section 360bbb-3(b)(1), unless the authorization is terminated or revoked.  Performed at [facility], [place] [address], [place], [place] [address]   Culture, blood (routine x 2)     Status: None (Preliminary result)   Collection Time: 05/05/21  8:40 PM   Specimen: BLOOD  Result Value Ref Range Status   Specimen Description BLOOD RIGHT ANTECUBITAL  Final   Special Requests   Final    BOTTLES DRAWN AEROBIC AND ANAEROBIC Blood Culture adequate volume   Culture   Final    NO GROWTH 4 DAYS Performed at [facility], [place] [address], [place], [place] [address]    Report Status PENDING  Incomplete  Culture, blood (routine x 2)     Status: None (Preliminary result)   Collection Time: 05/05/21  8:45 PM   Specimen: BLOOD RIGHT HAND  Result Value Ref Range Status   Specimen Description BLOOD RIGHT HAND  Final   Special Requests   Final    BOTTLES DRAWN AEROBIC AND ANAEROBIC Blood Culture adequate volume   Culture   Final    NO GROWTH 4 DAYS Performed at [facility], [place] [address] [place], [place] [address]    Report Status PENDING  Incomplete  Urine culture     Status: None   Collection Time: 05/05/21 10:36 PM   Specimen: Urine, Random  Result Value Ref Range Status   Specimen Description URINE, RANDOM  Final   Special Requests NONE  Final   Culture   Final    NO GROWTH Performed at [facility], [place]  [address] [place], [place] [address]    Report Status 05/07/2021 FINAL  Final  MRSA PCR Screening     Status: None   Collection Time: 05/06/21  8:01 PM   Specimen: Nasopharyngeal  Result Value Ref Range Status   MRSA by PCR NEGATIVE NEGATIVE Final    Comment:  The GeneXpert MRSA Assay (FDA approved for NASAL specimens only), is one component of a comprehensive MRSA colonization surveillance program. It is not intended to diagnose MRSA infection nor to guide or monitor treatment for MRSA infections. Performed at [facility], [place] [address] [place], [place] [address]   Culture, blood (Routine X 2) w Reflex to ID Panel     Status: None (Preliminary result)   Collection Time: 05/09/21  3:26 PM   Specimen: BLOOD  Result Value Ref Range Status   Specimen Description BLOOD LEFT ANTECUBITAL  Final   Special Requests   Final    BOTTLES DRAWN AEROBIC AND ANAEROBIC Blood Culture adequate volume   Culture   Final    NO GROWTH < 24 HOURS Performed at [facility], [place] [address], [place], [place] [address]    Report Status PENDING  Incomplete  Culture, blood (Routine X 2) w Reflex to ID Panel     Status: None (Preliminary result)   Collection Time: 05/09/21  3:26 PM   Specimen: BLOOD  Result Value Ref Range Status   Specimen Description BLOOD LEFT ANTECUBITAL  Final   Special Requests   Final    BOTTLES DRAWN AEROBIC AND ANAEROBIC Blood Culture adequate volume   Culture   Final    NO GROWTH < 24 HOURS Performed at [facility], [place] [address] [place],  [address]    Report Status PENDING  Incomplete         Radiology Studies: MR FOOT RIGHT WO CONTRAST  Result Date: 05/09/2021 CLINICAL DATA:  Bilateral foot and ankle pain and swelling. The patient is unable to walk. Fever. EXAM: MRI OF THE RIGHT FOREFOOT WITHOUT CONTRAST; MRI OF THE RIGHT ANKLE WITHOUT CONTRAST TECHNIQUE: Multiplanar, multisequence MR imaging of the right ankle and forefoot was  performed. No intravenous contrast was administered. COMPARISON:  Plain films right foot 05/05/2022. FINDINGS: Bones/Joint/Cartilage Marrow edema is seen throughout almost the entire medial cuneiform and about the second, third and fourth tarsometatarsal joints. Edema appears worst at the articulation of the middle and medial cuneiforms and the second tarsometatarsal joint. An intraosseous geode is seen in the talus at the posterior facet of the subtalar joint. No joint effusion is identified. No fracture. Ligaments Intact. Muscles and Tendons No intramuscular fluid collection. There is mild fatty atrophy of intrinsic musculature the foot. Soft tissues Mild subcutaneous edema about the foot and ankle is noted. IMPRESSION: Edema about the first through fourth tarsometatarsal joints and articulation of the middle and medial cuneiforms is nonspecific but likely due to degenerative or stress change rather than osteomyelitis. Negative for abscess or joint effusion. Mild subcutaneous edema about the dorsum of the foot could be due to dependent change or cellulitis. Electronically Signed   By: Inge Rise M.D.   On: 05/09/2021 13:27   MR FOOT LEFT WO CONTRAST  Result Date: 05/09/2021 CLINICAL DATA:  Left foot and ankle pain. History of fevers. The patient is unable to walk. Question septic arthritis. EXAM: MRI OF THE LEFT FOOT WITHOUT CONTRAST; MRI OF THE LEFT ANKLE WITHOUT CONTRAST TECHNIQUE: Multiplanar, multisequence MR imaging of the left foot and ankle was performed. No intravenous contrast was administered. COMPARISON:  Plain films left foot 05/05/2021. FINDINGS: Bones/Joint/Cartilage This examination is somewhat degraded by patient motion. No joint effusion is seen. No fracture or stress change. Mild marrow edema is seen about the second, third and fourth tarsometatarsal joints. Imaged bones otherwise appear normal. Ligaments Intact and normal  in appearance. Muscles and Tendons No intramuscular fluid  collection is identified. No tendon tear or strain. There is mild atrophy of intrinsic musculature the foot. Soft tissues No focal fluid collection is identified. Mild to moderate subcutaneous edema over the dorsum of the foot and about the ankle is noted. IMPRESSION: Mild marrow edema about the second, third and fourth tarsometatarsal joints without effusion is likely due to stress change or mild degenerative disease. The appearance is not typical of osteomyelitis although infection cannot be completely excluded. Negative for abscess. Subcutaneous edema about the foot and ankle could be due to dependent change or cellulitis. Electronically Signed   By: Inge Rise M.D.   On: 05/09/2021 13:20   MR ANKLE RIGHT WO CONTRAST  Result Date: 05/09/2021 CLINICAL DATA:  Bilateral foot and ankle pain and swelling. The patient is unable to walk. Fever. EXAM: MRI OF THE RIGHT FOREFOOT WITHOUT CONTRAST; MRI OF THE RIGHT ANKLE WITHOUT CONTRAST TECHNIQUE: Multiplanar, multisequence MR imaging of the right ankle and forefoot was performed. No intravenous contrast was administered. COMPARISON:  Plain films right foot 05/05/2022. FINDINGS: Bones/Joint/Cartilage Marrow edema is seen throughout almost the entire medial cuneiform and about the second, third and fourth tarsometatarsal joints. Edema appears worst at the articulation of the middle and medial cuneiforms and the second tarsometatarsal joint. An intraosseous geode is seen in the talus at the posterior facet of the subtalar joint. No joint effusion is identified. No fracture. Ligaments Intact. Muscles and Tendons No intramuscular fluid collection. There is mild fatty atrophy of intrinsic musculature the foot. Soft tissues Mild subcutaneous edema about the foot and ankle is noted. IMPRESSION: Edema about the first through fourth tarsometatarsal joints and articulation of the middle and medial cuneiforms is nonspecific but likely due to degenerative or stress change  rather than osteomyelitis. Negative for abscess or joint effusion. Mild subcutaneous edema about the dorsum of the foot could be due to dependent change or cellulitis. Electronically Signed   By: Inge Rise M.D.   On: 05/09/2021 13:27   MR ANKLE LEFT WO CONTRAST  Result Date: 05/09/2021 CLINICAL DATA:  Left foot and ankle pain. History of fevers. The patient is unable to walk. Question septic arthritis. EXAM: MRI OF THE LEFT FOOT WITHOUT CONTRAST; MRI OF THE LEFT ANKLE WITHOUT CONTRAST TECHNIQUE: Multiplanar, multisequence MR imaging of the left foot and ankle was performed. No intravenous contrast was administered. COMPARISON:  Plain films left foot 05/05/2021. FINDINGS: Bones/Joint/Cartilage This examination is somewhat degraded by patient motion. No joint effusion is seen. No fracture or stress change. Mild marrow edema is seen about the second, third and fourth tarsometatarsal joints. Imaged bones otherwise appear normal. Ligaments Intact and normal in appearance. Muscles and Tendons No intramuscular fluid collection is identified. No tendon tear or strain. There is mild atrophy of intrinsic musculature the foot. Soft tissues No focal fluid collection is identified. Mild to moderate subcutaneous edema over the dorsum of the foot and about the ankle is noted. IMPRESSION: Mild marrow edema about the second, third and fourth tarsometatarsal joints without effusion is likely due to stress change or mild degenerative disease. The appearance is not typical of osteomyelitis although infection cannot be completely excluded. Negative for abscess. Subcutaneous edema about the foot and ankle could be due to dependent change or cellulitis. Electronically Signed   By: Inge Rise M.D.   On: 05/09/2021 13:20        Scheduled Meds: . colchicine  0.6 mg Oral Daily  . empagliflozin  10 mg Oral QAC breakfast  . furosemide  40 mg Oral Daily  . gabapentin  200 mg Oral TID  . hydrALAZINE  10 mg Oral Q8H   . isosorbide mononitrate  30 mg Oral Daily  . predniSONE  20 mg Oral Q breakfast  . sacubitril-valsartan  1 tablet Oral BID  . spironolactone  25 mg Oral Daily  . warfarin  10 mg Oral Once per day on Mon Wed Fri  . warfarin  7.5 mg Oral Once per day on Sun Tue Thu Sat  . Warfarin - Pharmacist Dosing Inpatient   Does not apply q1600   Continuous Infusions:   LOS: 1 day    Berle Mull, MD 05/10/2021, 7:55 PM

## 2021-05-10 NOTE — Plan of Care (Signed)
  Problem: Health Behavior/Discharge Planning: Goal: Ability to manage health-related needs will improve Outcome: Progressing   Problem: Pain Managment: Goal: General experience of comfort will improve Outcome: Progressing   

## 2021-05-10 NOTE — Progress Notes (Signed)
Subjective: Still having drenching sweats at night   Antibiotics:  Anti-infectives (From admission, onward)   Start     Dose/Rate Route Frequency Ordered Stop   05/07/21 1400  azithromycin (ZITHROMAX) tablet 500 mg  Status:  Discontinued        500 mg Oral Daily 05/07/21 1300 05/09/21 1043      Medications: Scheduled Meds: . colchicine  0.6 mg Oral Daily  . empagliflozin  10 mg Oral QAC breakfast  . furosemide  40 mg Oral Daily  . gabapentin  200 mg Oral TID  . hydrALAZINE  10 mg Oral Q8H  . isosorbide mononitrate  30 mg Oral Daily  . predniSONE  20 mg Oral Q breakfast  . sacubitril-valsartan  1 tablet Oral BID  . spironolactone  25 mg Oral Daily  . warfarin  10 mg Oral Once per day on Mon Wed Fri  . warfarin  7.5 mg Oral Once per day on Sun Tue Thu Sat  . Warfarin - Pharmacist Dosing Inpatient   Does not apply q1600   Continuous Infusions: PRN Meds:.acetaminophen **OR** acetaminophen, guaiFENesin    Objective: Weight change:   Intake/Output Summary (Last 24 hours) at 05/10/2021 1624 Last data filed at 05/10/2021 1300 Gross per 24 hour  Intake 1080 ml  Output 2575 ml  Net -1495 ml   Blood pressure 107/82, pulse 69, temperature 98.3 F (36.8 C), temperature source Oral, resp. rate 18, height 5\' 11"  (1.803 m), weight 120 kg, SpO2 93 %. Temp:  [98 F (36.7 C)-98.5 F (36.9 C)] 98.3 F (36.8 C) (05/24 0945) Pulse Rate:  [57-70] 69 (05/24 0945) Resp:  [16-18] 18 (05/24 0945) BP: (98-125)/(63-84) 107/82 (05/24 0945) SpO2:  [93 %-99 %] 93 % (05/24 0945)  Physical Exam: Physical Exam Constitutional:      Appearance: He is well-developed.  HENT:     Head: Normocephalic and atraumatic.  Eyes:     Conjunctiva/sclera: Conjunctivae normal.  Cardiovascular:     Rate and Rhythm: Normal rate and regular rhythm.     Heart sounds: No murmur heard. No gallop.   Pulmonary:     Effort: Pulmonary effort is normal. No respiratory distress.     Breath sounds:  Normal breath sounds. No stridor. No wheezing or rhonchi.  Abdominal:     General: There is no distension.     Palpations: Abdomen is soft.  Musculoskeletal:     Right wrist: No swelling, deformity, effusion or tenderness. Decreased range of motion.     Cervical back: Normal range of motion and neck supple.     Comments: His range of motion in both feet and ankles have improved  Skin:    General: Skin is warm and dry.     Findings: No erythema or rash.  Neurological:     General: No focal deficit present.     Mental Status: He is alert and oriented to person, place, and time.  Psychiatric:        Mood and Affect: Mood normal.        Behavior: Behavior normal.        Thought Content: Thought content normal.        Judgment: Judgment normal.      CBC:    BMET Recent Labs    05/10/21 0359  NA 134*  K 4.4  CL 102  CO2 24  GLUCOSE 143*  BUN 26*  CREATININE 1.40*  CALCIUM 9.5     Liver Panel  Recent Labs    05/10/21 0359  PROT 7.0  ALBUMIN 3.0*  AST 13*  ALT 18  ALKPHOS 88  BILITOT 1.1       Sedimentation Rate Recent Labs    05/10/21 0359  ESRSEDRATE 59*   C-Reactive Protein Recent Labs    05/10/21 0359  CRP 6.5*    Micro Results: Recent Results (from the past 720 hour(s))  Resp Panel by RT-PCR (Flu A&B, Covid) Nasopharyngeal Swab     Status: None   Collection Time: 05/03/21  3:47 AM   Specimen: Nasopharyngeal Swab; Nasopharyngeal(NP) swabs in vial transport medium  Result Value Ref Range Status   SARS Coronavirus 2 by RT PCR NEGATIVE NEGATIVE Final    Comment: (NOTE) SARS-CoV-2 target nucleic acids are NOT DETECTED.  The SARS-CoV-2 RNA is generally detectable in upper respiratory specimens during the acute phase of infection. The lowest concentration of SARS-CoV-2 viral copies this assay can detect is 138 copies/mL. A negative result does not preclude SARS-Cov-2 infection and should not be used as the sole basis for treatment or other  patient management decisions. A negative result may occur with  improper specimen collection/handling, submission of specimen other than nasopharyngeal swab, presence of viral mutation(s) within the areas targeted by this assay, and inadequate number of viral copies(<138 copies/mL). A negative result must be combined with clinical observations, patient history, and epidemiological information. The expected result is Negative.  Fact Sheet for Patients:  EntrepreneurPulse.com.au  Fact Sheet for Healthcare Providers:  IncredibleEmployment.be  This test is no t yet approved or cleared by the [place] FDA and  has been authorized for detection and/or diagnosis of SARS-CoV-2 by FDA under an Emergency Use Authorization (EUA). This EUA will remain  in effect (meaning this test can be used) for the duration of the COVID-19 declaration under Section 564(b)(1) of the Act, 21 U.S.C.section 360bbb-3(b)(1), unless the authorization is terminated  or revoked sooner.       Influenza A by PCR NEGATIVE NEGATIVE Final   Influenza B by PCR NEGATIVE NEGATIVE Final    Comment: (NOTE) The Xpert Xpress SARS-CoV-2/FLU/RSV plus assay is intended as an aid in the diagnosis of influenza from Nasopharyngeal swab specimens and should not be used as a sole basis for treatment. Nasal washings and aspirates are unacceptable for Xpert Xpress SARS-CoV-2/FLU/RSV testing.  Fact Sheet for Patients: EntrepreneurPulse.com.au  Fact Sheet for Healthcare Providers: IncredibleEmployment.be  This test is not yet approved or cleared by the [place] FDA and has been authorized for detection and/or diagnosis of SARS-CoV-2 by FDA under an Emergency Use Authorization (EUA). This EUA will remain in effect (meaning this test can be used) for the duration of the COVID-19 declaration under Section 564(b)(1) of the Act, 21 U.S.C. section  360bbb-3(b)(1), unless the authorization is terminated or revoked.  Performed at [facility], [place] [address] [place], [place] [address]   Blood culture (routine x 2)     Status: None   Collection Time: 05/03/21  4:15 AM   Specimen: BLOOD  Result Value Ref Range Status   Specimen Description BLOOD SITE NOT SPECIFIED  Final   Special Requests   Final    BOTTLES DRAWN AEROBIC AND ANAEROBIC Blood Culture results may not be optimal due to an inadequate volume of blood received in culture bottles   Culture   Final    NO GROWTH 5 DAYS Performed at [facility], [place] [address] [place], [place] [address]    Report Status  05/08/2021 FINAL  Final  Blood culture (routine x 2)     Status: None   Collection Time: 05/03/21  4:17 AM   Specimen: BLOOD  Result Value Ref Range Status   Specimen Description BLOOD SITE NOT SPECIFIED  Final   Special Requests   Final    BOTTLES DRAWN AEROBIC AND ANAEROBIC Blood Culture results may not be optimal due to an inadequate volume of blood received in culture bottles   Culture   Final    NO GROWTH 5 DAYS Performed at [facility], [place] [address], [place], [place] [address]    Report Status 05/08/2021 FINAL  Final  Resp Panel by RT-PCR (Flu A&B, Covid) Nasopharyngeal Swab     Status: None   Collection Time: 05/05/21  8:24 PM   Specimen: Nasopharyngeal Swab; Nasopharyngeal(NP) swabs in vial transport medium  Result Value Ref Range Status   SARS Coronavirus 2 by RT PCR NEGATIVE NEGATIVE Final    Comment: (NOTE) SARS-CoV-2 target nucleic acids are NOT DETECTED.  The SARS-CoV-2 RNA is generally detectable in upper respiratory specimens during the acute phase of infection. The lowest concentration of SARS-CoV-2 viral copies this assay can detect is 138 copies/mL. A negative result does not preclude SARS-Cov-2 infection and should not be used as the sole basis for treatment or other patient management decisions. A negative result may occur  with  improper specimen collection/handling, submission of specimen other than nasopharyngeal swab, presence of viral mutation(s) within the areas targeted by this assay, and inadequate number of viral copies(<138 copies/mL). A negative result must be combined with clinical observations, patient history, and epidemiological information. The expected result is Negative.  Fact Sheet for Patients:  EntrepreneurPulse.com.au  Fact Sheet for Healthcare Providers:  IncredibleEmployment.be  This test is no t yet approved or cleared by the [place] FDA and  has been authorized for detection and/or diagnosis of SARS-CoV-2 by FDA under an Emergency Use Authorization (EUA). This EUA will remain  in effect (meaning this test can be used) for the duration of the COVID-19 declaration under Section 564(b)(1) of the Act, 21 U.S.C.section 360bbb-3(b)(1), unless the authorization is terminated  or revoked sooner.       Influenza A by PCR NEGATIVE NEGATIVE Final   Influenza B by PCR NEGATIVE NEGATIVE Final    Comment: (NOTE) The Xpert Xpress SARS-CoV-2/FLU/RSV plus assay is intended as an aid in the diagnosis of influenza from Nasopharyngeal swab specimens and should not be used as a sole basis for treatment. Nasal washings and aspirates are unacceptable for Xpert Xpress SARS-CoV-2/FLU/RSV testing.  Fact Sheet for Patients: EntrepreneurPulse.com.au  Fact Sheet for Healthcare Providers: IncredibleEmployment.be  This test is not yet approved or cleared by the [place] FDA and has been authorized for detection and/or diagnosis of SARS-CoV-2 by FDA under an Emergency Use Authorization (EUA). This EUA will remain in effect (meaning this test can be used) for the duration of the COVID-19 declaration under Section 564(b)(1) of the Act, 21 U.S.C. section 360bbb-3(b)(1), unless the authorization is terminated  or revoked.  Performed at [facility], [place] [address], [place], [place] [address]   Culture, blood (routine x 2)     Status: None (Preliminary result)   Collection Time: 05/05/21  8:40 PM   Specimen: BLOOD  Result Value Ref Range Status   Specimen Description BLOOD RIGHT ANTECUBITAL  Final   Special Requests   Final    BOTTLES DRAWN AEROBIC AND ANAEROBIC Blood Culture adequate volume  Culture   Final    NO GROWTH 4 DAYS Performed at [facility], [place] [address] [place], [place] [address]    Report Status PENDING  Incomplete  Culture, blood (routine x 2)     Status: None (Preliminary result)   Collection Time: 05/05/21  8:45 PM   Specimen: BLOOD RIGHT HAND  Result Value Ref Range Status   Specimen Description BLOOD RIGHT HAND  Final   Special Requests   Final    BOTTLES DRAWN AEROBIC AND ANAEROBIC Blood Culture adequate volume   Culture   Final    NO GROWTH 4 DAYS Performed at [facility], [place] [address] [place], [place] [address]    Report Status PENDING  Incomplete  Urine culture     Status: None   Collection Time: 05/05/21 10:36 PM   Specimen: Urine, Random  Result Value Ref Range Status   Specimen Description URINE, RANDOM  Final   Special Requests NONE  Final   Culture   Final    NO GROWTH Performed at [facility], [place] [address], [place], [place] [address]    Report Status 05/07/2021 FINAL  Final  MRSA PCR Screening     Status: None   Collection Time: 05/06/21  8:01 PM   Specimen: Nasopharyngeal  Result Value Ref Range Status   MRSA by PCR NEGATIVE NEGATIVE Final    Comment:        The GeneXpert MRSA Assay (FDA approved for NASAL specimens only), is one component of a comprehensive MRSA colonization surveillance program. It is not intended to diagnose MRSA infection nor to guide or monitor treatment for MRSA infections. Performed at [facility], [place] [address] [place], [place] [address]   Culture, blood (Routine X 2)  w Reflex to ID Panel     Status: None (Preliminary result)   Collection Time: 05/09/21  3:26 PM   Specimen: BLOOD  Result Value Ref Range Status   Specimen Description BLOOD LEFT ANTECUBITAL  Final   Special Requests   Final    BOTTLES DRAWN AEROBIC AND ANAEROBIC Blood Culture adequate volume   Culture   Final    NO GROWTH < 24 HOURS Performed at [facility], [place] [address] [place],  [address]    Report Status PENDING  Incomplete  Culture, blood (Routine X 2) w Reflex to ID Panel     Status: None (Preliminary result)   Collection Time: 05/09/21  3:26 PM   Specimen: BLOOD  Result Value Ref Range Status   Specimen Description BLOOD LEFT ANTECUBITAL  Final   Special Requests   Final    BOTTLES DRAWN AEROBIC AND ANAEROBIC Blood Culture adequate volume   Culture   Final    NO GROWTH < 24 HOURS Performed at [facility], [place] [address], [place],  [address]    Report Status PENDING  Incomplete    Studies/Results: MR FOOT RIGHT WO CONTRAST  Result Date: 05/09/2021 CLINICAL DATA:  Bilateral foot and ankle pain and swelling. The patient is unable to walk. Fever. EXAM: MRI OF THE RIGHT FOREFOOT WITHOUT CONTRAST; MRI OF THE RIGHT ANKLE WITHOUT CONTRAST TECHNIQUE: Multiplanar, multisequence MR imaging of the right ankle and forefoot was performed. No intravenous contrast was administered. COMPARISON:  Plain films right foot 05/05/2022. FINDINGS: Bones/Joint/Cartilage Marrow edema is seen throughout almost the entire medial cuneiform and about the second, third and fourth tarsometatarsal joints. Edema appears worst at the articulation of  the middle and medial cuneiforms and the second tarsometatarsal joint. An intraosseous geode is seen in the talus at the posterior facet of the subtalar joint. No joint effusion is identified. No fracture. Ligaments Intact. Muscles and Tendons No intramuscular fluid collection. There is mild fatty atrophy of intrinsic musculature the foot.  Soft tissues Mild subcutaneous edema about the foot and ankle is noted. IMPRESSION: Edema about the first through fourth tarsometatarsal joints and articulation of the middle and medial cuneiforms is nonspecific but likely due to degenerative or stress change rather than osteomyelitis. Negative for abscess or joint effusion. Mild subcutaneous edema about the dorsum of the foot could be due to dependent change or cellulitis. Electronically Signed   By: Inge Rise M.D.   On: 05/09/2021 13:27   MR FOOT LEFT WO CONTRAST  Result Date: 05/09/2021 CLINICAL DATA:  Left foot and ankle pain. History of fevers. The patient is unable to walk. Question septic arthritis. EXAM: MRI OF THE LEFT FOOT WITHOUT CONTRAST; MRI OF THE LEFT ANKLE WITHOUT CONTRAST TECHNIQUE: Multiplanar, multisequence MR imaging of the left foot and ankle was performed. No intravenous contrast was administered. COMPARISON:  Plain films left foot 05/05/2021. FINDINGS: Bones/Joint/Cartilage This examination is somewhat degraded by patient motion. No joint effusion is seen. No fracture or stress change. Mild marrow edema is seen about the second, third and fourth tarsometatarsal joints. Imaged bones otherwise appear normal. Ligaments Intact and normal in appearance. Muscles and Tendons No intramuscular fluid collection is identified. No tendon tear or strain. There is mild atrophy of intrinsic musculature the foot. Soft tissues No focal fluid collection is identified. Mild to moderate subcutaneous edema over the dorsum of the foot and about the ankle is noted. IMPRESSION: Mild marrow edema about the second, third and fourth tarsometatarsal joints without effusion is likely due to stress change or mild degenerative disease. The appearance is not typical of osteomyelitis although infection cannot be completely excluded. Negative for abscess. Subcutaneous edema about the foot and ankle could be due to dependent change or cellulitis. Electronically  Signed   By: Inge Rise M.D.   On: 05/09/2021 13:20   MR ANKLE RIGHT WO CONTRAST  Result Date: 05/09/2021 CLINICAL DATA:  Bilateral foot and ankle pain and swelling. The patient is unable to walk. Fever. EXAM: MRI OF THE RIGHT FOREFOOT WITHOUT CONTRAST; MRI OF THE RIGHT ANKLE WITHOUT CONTRAST TECHNIQUE: Multiplanar, multisequence MR imaging of the right ankle and forefoot was performed. No intravenous contrast was administered. COMPARISON:  Plain films right foot 05/05/2022. FINDINGS: Bones/Joint/Cartilage Marrow edema is seen throughout almost the entire medial cuneiform and about the second, third and fourth tarsometatarsal joints. Edema appears worst at the articulation of the middle and medial cuneiforms and the second tarsometatarsal joint. An intraosseous geode is seen in the talus at the posterior facet of the subtalar joint. No joint effusion is identified. No fracture. Ligaments Intact. Muscles and Tendons No intramuscular fluid collection. There is mild fatty atrophy of intrinsic musculature the foot. Soft tissues Mild subcutaneous edema about the foot and ankle is noted. IMPRESSION: Edema about the first through fourth tarsometatarsal joints and articulation of the middle and medial cuneiforms is nonspecific but likely due to degenerative or stress change rather than osteomyelitis. Negative for abscess or joint effusion. Mild subcutaneous edema about the dorsum of the foot could be due to dependent change or cellulitis. Electronically Signed   By: Inge Rise M.D.   On: 05/09/2021 13:27   MR ANKLE LEFT WO CONTRAST  Result Date: 05/09/2021 CLINICAL DATA:  Left foot and ankle pain. History of fevers. The patient is unable to walk. Question septic arthritis. EXAM: MRI OF THE LEFT FOOT WITHOUT CONTRAST; MRI OF THE LEFT ANKLE WITHOUT CONTRAST TECHNIQUE: Multiplanar, multisequence MR imaging of the left foot and ankle was performed. No intravenous contrast was administered. COMPARISON:  Plain  films left foot 05/05/2021. FINDINGS: Bones/Joint/Cartilage This examination is somewhat degraded by patient motion. No joint effusion is seen. No fracture or stress change. Mild marrow edema is seen about the second, third and fourth tarsometatarsal joints. Imaged bones otherwise appear normal. Ligaments Intact and normal in appearance. Muscles and Tendons No intramuscular fluid collection is identified. No tendon tear or strain. There is mild atrophy of intrinsic musculature the foot. Soft tissues No focal fluid collection is identified. Mild to moderate subcutaneous edema over the dorsum of the foot and about the ankle is noted. IMPRESSION: Mild marrow edema about the second, third and fourth tarsometatarsal joints without effusion is likely due to stress change or mild degenerative disease. The appearance is not typical of osteomyelitis although infection cannot be completely excluded. Negative for abscess. Subcutaneous edema about the foot and ankle could be due to dependent change or cellulitis. Electronically Signed   By: Inge Rise M.D.   On: 05/09/2021 13:20      Assessment/Plan:  INTERVAL HISTORY: MRI of both feet and ankles not suggestive of infection   Principal Problem:   Fever of unknown origin Active Problems:   Congestive dilated cardiomyopathy (HCC)   Essential hypertension   CKD (chronic kidney disease), stage II-III   S/P AVR   Fever    [name] is a 60 y.o. male with admission with severe foot and ankle pain and high fevers, history of aortic valve replacement, drenching night sweats while in the hospital.  #1 FUO:  Not clear to me why he had his high fever he had and now with his MRI findings I cannot really relate his pain and inability to walk there to an infectious etiology.  Already had a CT of the chest which was negative he has had blood cultures that were negative and repeat blood cultures taken yesterday though he was on a macrolide at the  time.  2D echocardiogram does not show evidence of endocarditis  I will order CT of the abdomen and pelvis with oral contrast only  Would consider TEE though if he no longer is having fevers and only having the night sweats (which he was not having so much at home) could not pursue this and see how he does in the outpatient world. He wonders if the type of pillow is irritating him and causing him to sweat   #2  Foot ankle and wrist pain: Seems to be improving on corticosteroids and colchicine consistent with likely gout  I spent greater than 76minutes with the patient including greater than 50% of time in face to face counsel of the patient and review of his radiographs personally his laboratory microbiological data and in coordination of his care.   LOS: 1 day   Alcide Evener 05/10/2021, 4:24 PM

## 2021-05-11 ENCOUNTER — Inpatient Hospital Stay (HOSPITAL_COMMUNITY): Payer: Medicare (Managed Care)

## 2021-05-11 ENCOUNTER — Encounter (HOSPITAL_COMMUNITY): Payer: Medicare Other

## 2021-05-11 DIAGNOSIS — M79671 Pain in right foot: Secondary | ICD-10-CM

## 2021-05-11 DIAGNOSIS — K7469 Other cirrhosis of liver: Secondary | ICD-10-CM

## 2021-05-11 DIAGNOSIS — N1831 Chronic kidney disease, stage 3a: Secondary | ICD-10-CM

## 2021-05-11 DIAGNOSIS — R509 Fever, unspecified: Secondary | ICD-10-CM | POA: Diagnosis not present

## 2021-05-11 DIAGNOSIS — I5023 Acute on chronic systolic (congestive) heart failure: Secondary | ICD-10-CM | POA: Diagnosis not present

## 2021-05-11 DIAGNOSIS — I739 Peripheral vascular disease, unspecified: Secondary | ICD-10-CM

## 2021-05-11 DIAGNOSIS — M109 Gout, unspecified: Secondary | ICD-10-CM | POA: Diagnosis not present

## 2021-05-11 DIAGNOSIS — M79672 Pain in left foot: Secondary | ICD-10-CM

## 2021-05-11 DIAGNOSIS — I420 Dilated cardiomyopathy: Secondary | ICD-10-CM | POA: Diagnosis not present

## 2021-05-11 DIAGNOSIS — I10 Essential (primary) hypertension: Secondary | ICD-10-CM | POA: Diagnosis not present

## 2021-05-11 DIAGNOSIS — N182 Chronic kidney disease, stage 2 (mild): Secondary | ICD-10-CM | POA: Diagnosis not present

## 2021-05-11 DIAGNOSIS — E1165 Type 2 diabetes mellitus with hyperglycemia: Secondary | ICD-10-CM

## 2021-05-11 LAB — CULTURE, BLOOD (ROUTINE X 2)
Culture: NO GROWTH
Culture: NO GROWTH
Special Requests: ADEQUATE
Special Requests: ADEQUATE

## 2021-05-11 LAB — COMPREHENSIVE METABOLIC PANEL
ALT: 21 U/L (ref 0–44)
AST: 16 U/L (ref 15–41)
Albumin: 3.1 g/dL — ABNORMAL LOW (ref 3.5–5.0)
Alkaline Phosphatase: 89 U/L (ref 38–126)
Anion gap: 9 (ref 5–15)
BUN: 32 mg/dL — ABNORMAL HIGH (ref 6–20)
CO2: 26 mmol/L (ref 22–32)
Calcium: 10 mg/dL (ref 8.9–10.3)
Chloride: 99 mmol/L (ref 98–111)
Creatinine, Ser: 1.52 mg/dL — ABNORMAL HIGH (ref 0.61–1.24)
GFR, Estimated: 52 mL/min — ABNORMAL LOW (ref >60)
Glucose, Bld: 141 mg/dL — ABNORMAL HIGH (ref 70–99)
Potassium: 4.7 mmol/L (ref 3.5–5.1)
Sodium: 134 mmol/L — ABNORMAL LOW (ref 135–145)
Total Bilirubin: 0.6 mg/dL (ref 0.3–1.2)
Total Protein: 7.2 g/dL (ref 6.5–8.1)

## 2021-05-11 LAB — GLUCOSE, CAPILLARY
Glucose-Capillary: 137 mg/dL — ABNORMAL HIGH (ref 70–99)
Glucose-Capillary: 135 mg/dL — ABNORMAL HIGH (ref 70–99)
Glucose-Capillary: 198 mg/dL — ABNORMAL HIGH (ref 70–99)
Glucose-Capillary: 136 mg/dL — ABNORMAL HIGH (ref 70–99)

## 2021-05-11 LAB — PROTIME-INR
INR: 2.2 — ABNORMAL HIGH (ref 0.8–1.2)
Prothrombin Time: 24.2 seconds — ABNORMAL HIGH (ref 11.4–15.2)

## 2021-05-11 LAB — RESP PANEL BY RT-PCR (FLU A&B, COVID) ARPGX2
Influenza A by PCR: NEGATIVE
Influenza B by PCR: NEGATIVE
SARS Coronavirus 2 by RT PCR: NEGATIVE

## 2021-05-11 LAB — CBC
HCT: 42.4 % (ref 39.0–52.0)
Hemoglobin: 13.6 g/dL (ref 13.0–17.0)
MCH: 27.6 pg (ref 26.0–34.0)
MCHC: 32.1 g/dL (ref 30.0–36.0)
MCV: 86.2 fL (ref 80.0–100.0)
Platelets: 414 10*3/uL — ABNORMAL HIGH (ref 150–400)
RBC: 4.92 MIL/uL (ref 4.22–5.81)
RDW: 14.2 % (ref 11.5–15.5)
WBC: 14.7 10*3/uL — ABNORMAL HIGH (ref 4.0–10.5)
nRBC: 0 % (ref 0.0–0.2)

## 2021-05-11 LAB — C-REACTIVE PROTEIN: CRP: 3.6 mg/dL — ABNORMAL HIGH (ref <1.0)

## 2021-05-11 MED ORDER — ISOSORBIDE MONONITRATE ER 30 MG PO TB24: 30.0000 mg | ORAL_TABLET | Freq: Every day | ORAL | Status: DC

## 2021-05-11 MED ORDER — INSULIN ASPART 100 UNIT/ML IJ SOLN: 0.0000 [IU] | Freq: Every day | INTRAMUSCULAR | Status: DC

## 2021-05-11 MED ORDER — INSULIN ASPART 100 UNIT/ML IJ SOLN
0.0000 [IU] | Freq: Three times a day (TID) | INTRAMUSCULAR | Status: DC
  Administered 2021-05-14: 1 [IU] via SUBCUTANEOUS

## 2021-05-11 MED ORDER — SPIRONOLACTONE 12.5 MG HALF TABLET
12.5000 mg | ORAL_TABLET | Freq: Every day | ORAL | Status: DC
  Administered 2021-05-11: 12.5 mg via ORAL
  Filled 2021-05-11 (×2): qty 1

## 2021-05-11 MED ORDER — ATORVASTATIN CALCIUM 40 MG PO TABS
40.0000 mg | ORAL_TABLET | Freq: Every day | ORAL | Status: DC
  Administered 2021-05-11 – 2021-05-13 (×3): 40 mg via ORAL
  Filled 2021-05-11 (×3): qty 1

## 2021-05-11 MED ORDER — SPIRONOLACTONE 25 MG PO TABS: 12.5000 mg | ORAL_TABLET | Freq: Every day | ORAL | 0 refills | Status: DC

## 2021-05-11 MED ORDER — GABAPENTIN 300 MG PO CAPS: 300.0000 mg | ORAL_CAPSULE | Freq: Three times a day (TID) | ORAL | 1 refills | Status: DC

## 2021-05-11 MED ORDER — PREDNISONE 20 MG PO TABS: 20.0000 mg | ORAL_TABLET | Freq: Every day | ORAL | 0 refills | Status: AC

## 2021-05-11 MED ORDER — LINAGLIPTIN 5 MG PO TABS
5.0000 mg | ORAL_TABLET | Freq: Every day | ORAL | Status: DC
  Administered 2021-05-11 – 2021-05-14 (×4): 5 mg via ORAL
  Filled 2021-05-11 (×4): qty 1

## 2021-05-11 MED ORDER — ASPIRIN EC 81 MG PO TBEC
81.0000 mg | DELAYED_RELEASE_TABLET | Freq: Every day | ORAL | Status: DC
  Administered 2021-05-11 – 2021-05-14 (×4): 81 mg via ORAL
  Filled 2021-05-11 (×4): qty 1

## 2021-05-11 MED ORDER — COLCHICINE 0.6 MG PO TABS: 0.6000 mg | ORAL_TABLET | Freq: Every day | ORAL | 0 refills | Status: DC

## 2021-05-11 NOTE — Plan of Care (Signed)
  Problem: Education: Goal: Knowledge of General Education information will improve Description: Including pain rating scale, medication(s)/side effects and non-pharmacologic comfort measures Outcome: Completed/Met   Problem: Health Behavior/Discharge Planning: Goal: Ability to manage health-related needs will improve Outcome: Completed/Met   Problem: Clinical Measurements: Goal: Will remain free from infection Outcome: Completed/Met Goal: Diagnostic test results will improve Outcome: Completed/Met Goal: Respiratory complications will improve Outcome: Completed/Met Goal: Cardiovascular complication will be avoided Outcome: Completed/Met

## 2021-05-11 NOTE — Plan of Care (Signed)
  Problem: Clinical Measurements: Goal: Ability to maintain clinical measurements within normal limits will improve Outcome: Completed/Met   Problem: Elimination: Goal: Will not experience complications related to bowel motility Outcome: Completed/Met Goal: Will not experience complications related to urinary retention Outcome: Completed/Met   Problem: Pain Managment: Goal: General experience of comfort will improve Outcome: Completed/Met   Problem: Safety: Goal: Ability to remain free from injury will improve Outcome: Completed/Met   Problem: Skin Integrity: Goal: Risk for impaired skin integrity will decrease Outcome: Completed/Met

## 2021-05-11 NOTE — Care Management Important Message (Signed)
Important Message  Patient Details  Name: [name] MRN: [id number] Date of Birth: January 24, 1961   Medicare Important Message Given:  Yes     Philomene Haff P Hellena Pridgen 05/11/2021, 2:56 PM

## 2021-05-11 NOTE — Progress Notes (Signed)
PROGRESS NOTE  ADYNN CASERES RUE:[id number] DOB: Jul 24, 1961   PCP: Flossie Buffy, NP  Patient is from: Home  DOA: 05/05/2021 LOS: 2  Chief complaints: Bilateral feet pain, swelling and productive cough  Brief Narrative / Interim history: 60 year old M with PMH of DM-2, AVR on Coumadin, HTN, systolic CHF, JYN-8G and obesity presenting with bilateral feet pain and swelling as well as productive cough, and admitted for fever of unknown origin, bilateral feet pain and acute on chronic systolic CHF.  Patient was febrile to 101.5 on presentation.  Infectious work-up including cultures, bilateral foot and ankle x-ray and MRI, TTE, and CT chest/abdomen/pelvis unrevealing.  Infectious disease consulted.  Patient received azithromycin for 3 days.  Uric acid was elevated, and he was a started on colchicine and prednisone.  Therapy recommended SNF.  TOC working on this.  Subjective: Seen and examined earlier this morning.  No major events overnight of this morning.  Continues to endorse bilateral feet pain.  He rates his pain 8-9 on a scale of 10 although he does not appear to be in that much distress.  He describes the pain as stabbing.  Worse with bearing weight.  No other complaints.  Objective: Vitals:   05/10/21 1748 05/10/21 2052 05/11/21 0543 05/11/21 1005  BP: 113/89 113/77 (!) 98/51 101/70  Pulse: 70 69 (!) 58 (!) 49  Resp: 20 16 20 18   Temp: 98 F (36.7 C) 97.8 F (36.6 C) 98.1 F (36.7 C) 98.1 F (36.7 C)  TempSrc: Oral Oral Oral Oral  SpO2: 96% 97% 94% 94%  Weight:      Height:        Intake/Output Summary (Last 24 hours) at 05/11/2021 1701 Last data filed at 05/11/2021 1254 Gross per 24 hour  Intake 1680 ml  Output 2500 ml  Net -820 ml   Filed Weights   05/06/21 0137 05/06/21 1610  Weight: 115.7 kg 120 kg    Examination:  GENERAL: No apparent distress.  Nontoxic. HEENT: MMM.  Vision and hearing grossly intact.  NECK: Supple.  No apparent JVD.  RESP: On RA.   No IWOB.  Fair aeration bilaterally. CVS:  RRR. Heart sounds normal.  ABD/GI/GU: BS+. Abd soft, NTND.  MSK/EXT:  Moves extremities. No apparent deformity.  Trace bilateral pedal edema.  SKIN: no apparent skin lesion or wound NEURO: Awake, alert and oriented appropriately.  No apparent focal neuro deficit. PSYCH: Calm. Normal affect.   Procedures:  None  Microbiology summarized: NFAOZ-30 and influenza PCR nonreactive MRSA PCR nonreactive Urine culture negative Blood cultures negative on 5/19 and 5/23  Assessment & Plan: Bilateral feet pain/bilateral PAD-likely due to gout and neuropathy.  ABI with bilateral PAD.  Uric acid elevated.  Inflammatory markers elevated but improved.  Otherwise, extensive unrevealing work-up as above -Continue colchicine and prednisone -Start aspirin and atorvastatin -Outpatient follow-up with vascular surgery  Right wrist pain: Could be due to gout -Treatment as above  Fever of unknown origin: likely due to gout.  Last true fever was on 5/19 Drenching night sweats -Infectious work-up unrevealing. -ID signed off.  Acute on chronic systolic heart failure: TTE with LVEF of 20 to 25%, GH, moderate LVH mildly reduced RVSF, moderate LAE.  Appears euvolemic on exam.  BNP was 1072.  Had about 2.4 L UOP/24 hours.  Net -4 L so far. Cr slightly up at 1.52. -Continue home p.o. Lasix 40 mg daily -GDMT-Entresto, hydralazine, Imdur, Jardiance and Aldactone.  Reduced Aldactone today. -Monitor fluid status and renal function  Elevated troponin: Peaked at 84.  Likely demand ischemia -Cardiac meds as above -Added aspirin and atorvastatin given PAD   Essential hypertension: Normotensive. -Cardiac meds as above  History of aortic valve replacement: INR therapeutic -Continue Coumadin per pharmacy  Uncontrolled DM-2 with neuropathy, hyperglycemia and macrovascular complication: Y3K 1.6%.  On Jardiance at home.  Hyperglycemia likely due to steroid. No results for  input(s): HGBA1C in the last 72 hours. Recent Labs  Lab 05/10/21 0646 05/10/21 2117 05/11/21 1006 05/11/21 1228 05/11/21 1618  GLUCAP 120* 194* 137* 135* 198*  -Continue Jardiance -Added Tradjenta -Add SSI-sensitive.  CKD-3A: Stable. Recent Labs    01/10/21 1410 01/11/21 1456 01/13/21 1616 01/17/21 1537 02/09/21 1448 05/02/21 2033 05/05/21 1600 05/06/21 1321 05/10/21 0359 05/11/21 0405  BUN 15 16 21* 22* 21* 19 23* 28* 26* 32*  CREATININE 1.40* 1.68* 1.84* 1.74* 1.65* 1.70* 1.66* 1.69* 1.40* 1.52*  -Continue monitoring  Liver cirrhosis: Incidental finding on CT abdomen and pelvis.  Reports alcohol in the past -Check acute hepatitis panel -Encourage weight loss  Morbid obesity Body mass index is 36.9 kg/m.         DVT prophylaxis:   warfarin (COUMADIN) tablet 7.5 mg  warfarin (COUMADIN) tablet 10 mg  Code Status: Full code Family Communication: Patient and/or RN. Available if any question.  Level of care: Med-Surg Status is: Inpatient  Remains inpatient appropriate because:Unsafe d/c plan   Dispo: The patient is from: Home              Anticipated d/c is to: SNF              Patient currently is medically stable to d/c.   Difficult to place patient No       Consultants:  Infectious disease   Sch Meds:  Scheduled Meds: . aspirin EC  81 mg Oral Daily  . atorvastatin  40 mg Oral Daily  . colchicine  0.6 mg Oral Daily  . empagliflozin  10 mg Oral QAC breakfast  . furosemide  40 mg Oral Daily  . gabapentin  200 mg Oral TID  . hydrALAZINE  10 mg Oral Q8H  . insulin aspart  0-5 Units Subcutaneous QHS  . [START ON 05/12/2021] insulin aspart  0-9 Units Subcutaneous TID WC  . isosorbide mononitrate  30 mg Oral Daily  . linagliptin  5 mg Oral Daily  . predniSONE  20 mg Oral Q breakfast  . sacubitril-valsartan  1 tablet Oral BID  . spironolactone  12.5 mg Oral Daily  . warfarin  10 mg Oral Once per day on Mon Wed Fri  . warfarin  7.5 mg Oral  Once per day on Sun Tue Thu Sat  . Warfarin - Pharmacist Dosing Inpatient   Does not apply q1600   Continuous Infusions: PRN Meds:.acetaminophen **OR** acetaminophen, guaiFENesin  Antimicrobials: Anti-infectives (From admission, onward)   Start     Dose/Rate Route Frequency Ordered Stop   05/07/21 1400  azithromycin (ZITHROMAX) tablet 500 mg  Status:  Discontinued        500 mg Oral Daily 05/07/21 1300 05/09/21 1043       I have personally reviewed the following labs and images: CBC: Recent Labs  Lab 05/05/21 1600 05/06/21 1321 05/10/21 0359 05/11/21 0405  WBC 12.6* 11.3* 12.7* 14.7*  NEUTROABS 10.2*  --   --   --   HGB 14.2 14.1 13.8 13.6  HCT 44.9 42.7 41.7 42.4  MCV 87.5 86.6 84.9 86.2  PLT 260 285 369 414*  BMP &GFR Recent Labs  Lab 05/05/21 1600 05/06/21 1321 05/10/21 0359 05/11/21 0405  NA 134* 133* 134* 134*  K 4.8 4.8 4.4 4.7  CL 100 99 102 99  CO2 24 24 24 26   GLUCOSE 116* 131* 143* 141*  BUN 23* 28* 26* 32*  CREATININE 1.66* 1.69* 1.40* 1.52*  CALCIUM 9.2 8.8* 9.5 10.0   Estimated Creatinine Clearance: 68.1 mL/min (A) (by C-G formula based on SCr of 1.52 mg/dL (H)). Liver & Pancreas: Recent Labs  Lab 05/05/21 1600 05/06/21 1321 05/10/21 0359 05/11/21 0405  AST 25 23 13* 16  ALT 21 20 18 21   ALKPHOS 93 91 88 89  BILITOT 2.1* 1.4* 1.1 0.6  PROT 7.5 7.0 7.0 7.2  ALBUMIN 3.3* 3.0* 3.0* 3.1*   No results for input(s): LIPASE, AMYLASE in the last 168 hours. No results for input(s): AMMONIA in the last 168 hours. Diabetic: No results for input(s): HGBA1C in the last 72 hours. Recent Labs  Lab 05/10/21 0646 05/10/21 2117 05/11/21 1006 05/11/21 1228 05/11/21 1618  GLUCAP 120* 194* 137* 135* 198*   Cardiac Enzymes: No results for input(s): CKTOTAL, CKMB, CKMBINDEX, TROPONINI in the last 168 hours. No results for input(s): PROBNP in the last 8760 hours. Coagulation Profile: Recent Labs  Lab 05/07/21 0228 05/08/21 0148 05/09/21 0434  05/10/21 0359 05/11/21 0405  INR 3.1* 2.7* 2.1* 2.3* 2.2*   Thyroid Function Tests: No results for input(s): TSH, T4TOTAL, FREET4, T3FREE, THYROIDAB in the last 72 hours. Lipid Profile: No results for input(s): CHOL, HDL, LDLCALC, TRIG, CHOLHDL, LDLDIRECT in the last 72 hours. Anemia Panel: No results for input(s): VITAMINB12, FOLATE, FERRITIN, TIBC, IRON, RETICCTPCT in the last 72 hours. Urine analysis:    Component Value Date/Time   COLORURINE YELLOW 05/05/2021 2236   APPEARANCEUR CLEAR 05/05/2021 2236   LABSPEC 1.017 05/05/2021 2236   PHURINE 5.0 05/05/2021 2236   GLUCOSEU >=500 (A) 05/05/2021 2236   HGBUR NEGATIVE 05/05/2021 2236   BILIRUBINUR NEGATIVE 05/05/2021 2236   KETONESUR NEGATIVE 05/05/2021 2236   PROTEINUR 100 (A) 05/05/2021 2236   UROBILINOGEN 1.0 04/17/2015 1348   NITRITE NEGATIVE 05/05/2021 2236   LEUKOCYTESUR NEGATIVE 05/05/2021 2236   Sepsis Labs: Invalid input(s): PROCALCITONIN, [place]  Microbiology: Recent Results (from the past 240 hour(s))  Resp Panel by RT-PCR (Flu A&B, Covid) Nasopharyngeal Swab     Status: None   Collection Time: 05/03/21  3:47 AM   Specimen: Nasopharyngeal Swab; Nasopharyngeal(NP) swabs in vial transport medium  Result Value Ref Range Status   SARS Coronavirus 2 by RT PCR NEGATIVE NEGATIVE Final    Comment: (NOTE) SARS-CoV-2 target nucleic acids are NOT DETECTED.  The SARS-CoV-2 RNA is generally detectable in upper respiratory specimens during the acute phase of infection. The lowest concentration of SARS-CoV-2 viral copies this assay can detect is 138 copies/mL. A negative result does not preclude SARS-Cov-2 infection and should not be used as the sole basis for treatment or other patient management decisions. A negative result may occur with  improper specimen collection/handling, submission of specimen other than nasopharyngeal swab, presence of viral mutation(s) within the areas targeted by this assay, and inadequate  number of viral copies(<138 copies/mL). A negative result must be combined with clinical observations, patient history, and epidemiological information. The expected result is Negative.  Fact Sheet for Patients:  EntrepreneurPulse.com.au  Fact Sheet for Healthcare Providers:  IncredibleEmployment.be  This test is no t yet approved or cleared by the [place] FDA and  has been authorized for detection  and/or diagnosis of SARS-CoV-2 by FDA under an Emergency Use Authorization (EUA). This EUA will remain  in effect (meaning this test can be used) for the duration of the COVID-19 declaration under Section 564(b)(1) of the Act, 21 U.S.C.section 360bbb-3(b)(1), unless the authorization is terminated  or revoked sooner.       Influenza A by PCR NEGATIVE NEGATIVE Final   Influenza B by PCR NEGATIVE NEGATIVE Final    Comment: (NOTE) The Xpert Xpress SARS-CoV-2/FLU/RSV plus assay is intended as an aid in the diagnosis of influenza from Nasopharyngeal swab specimens and should not be used as a sole basis for treatment. Nasal washings and aspirates are unacceptable for Xpert Xpress SARS-CoV-2/FLU/RSV testing.  Fact Sheet for Patients: EntrepreneurPulse.com.au  Fact Sheet for Healthcare Providers: IncredibleEmployment.be  This test is not yet approved or cleared by the [place] FDA and has been authorized for detection and/or diagnosis of SARS-CoV-2 by FDA under an Emergency Use Authorization (EUA). This EUA will remain in effect (meaning this test can be used) for the duration of the COVID-19 declaration under Section 564(b)(1) of the Act, 21 U.S.C. section 360bbb-3(b)(1), unless the authorization is terminated or revoked.  Performed at [facility], [place] [address] [place], [place] [address]   Blood culture (routine x 2)     Status: None   Collection Time: 05/03/21  4:15 AM   Specimen: BLOOD   Result Value Ref Range Status   Specimen Description BLOOD SITE NOT SPECIFIED  Final   Special Requests   Final    BOTTLES DRAWN AEROBIC AND ANAEROBIC Blood Culture results may not be optimal due to an inadequate volume of blood received in culture bottles   Culture   Final    NO GROWTH 5 DAYS Performed at [facility], [place] [address], [place], [place] [address]    Report Status 05/08/2021 FINAL  Final  Blood culture (routine x 2)     Status: None   Collection Time: 05/03/21  4:17 AM   Specimen: BLOOD  Result Value Ref Range Status   Specimen Description BLOOD SITE NOT SPECIFIED  Final   Special Requests   Final    BOTTLES DRAWN AEROBIC AND ANAEROBIC Blood Culture results may not be optimal due to an inadequate volume of blood received in culture bottles   Culture   Final    NO GROWTH 5 DAYS Performed at [facility], [place] [address], [place], [place] [address]    Report Status 05/08/2021 FINAL  Final  Resp Panel by RT-PCR (Flu A&B, Covid) Nasopharyngeal Swab     Status: None   Collection Time: 05/05/21  8:24 PM   Specimen: Nasopharyngeal Swab; Nasopharyngeal(NP) swabs in vial transport medium  Result Value Ref Range Status   SARS Coronavirus 2 by RT PCR NEGATIVE NEGATIVE Final    Comment: (NOTE) SARS-CoV-2 target nucleic acids are NOT DETECTED.  The SARS-CoV-2 RNA is generally detectable in upper respiratory specimens during the acute phase of infection. The lowest concentration of SARS-CoV-2 viral copies this assay can detect is 138 copies/mL. A negative result does not preclude SARS-Cov-2 infection and should not be used as the sole basis for treatment or other patient management decisions. A negative result may occur with  improper specimen collection/handling, submission of specimen other than nasopharyngeal swab, presence of viral mutation(s) within the areas targeted by this assay, and inadequate number of viral copies(<138 copies/mL). A negative result must  be combined with clinical observations, patient history, and epidemiological  information. The expected result is Negative.  Fact Sheet for Patients:  EntrepreneurPulse.com.au  Fact Sheet for Healthcare Providers:  IncredibleEmployment.be  This test is no t yet approved or cleared by the [place] FDA and  has been authorized for detection and/or diagnosis of SARS-CoV-2 by FDA under an Emergency Use Authorization (EUA). This EUA will remain  in effect (meaning this test can be used) for the duration of the COVID-19 declaration under Section 564(b)(1) of the Act, 21 U.S.C.section 360bbb-3(b)(1), unless the authorization is terminated  or revoked sooner.       Influenza A by PCR NEGATIVE NEGATIVE Final   Influenza B by PCR NEGATIVE NEGATIVE Final    Comment: (NOTE) The Xpert Xpress SARS-CoV-2/FLU/RSV plus assay is intended as an aid in the diagnosis of influenza from Nasopharyngeal swab specimens and should not be used as a sole basis for treatment. Nasal washings and aspirates are unacceptable for Xpert Xpress SARS-CoV-2/FLU/RSV testing.  Fact Sheet for Patients: EntrepreneurPulse.com.au  Fact Sheet for Healthcare Providers: IncredibleEmployment.be  This test is not yet approved or cleared by the [place] FDA and has been authorized for detection and/or diagnosis of SARS-CoV-2 by FDA under an Emergency Use Authorization (EUA). This EUA will remain in effect (meaning this test can be used) for the duration of the COVID-19 declaration under Section 564(b)(1) of the Act, 21 U.S.C. section 360bbb-3(b)(1), unless the authorization is terminated or revoked.  Performed at [facility], [place] [address] [place], [place] [address]   Culture, blood (routine x 2)     Status: None   Collection Time: 05/05/21  8:40 PM   Specimen: BLOOD  Result Value Ref Range Status   Specimen Description BLOOD  RIGHT ANTECUBITAL  Final   Special Requests   Final    BOTTLES DRAWN AEROBIC AND ANAEROBIC Blood Culture adequate volume   Culture   Final    NO GROWTH 5 DAYS Performed at [facility], [place] [address], [place], [place] [address]    Report Status 05/11/2021 FINAL  Final  Culture, blood (routine x 2)     Status: None   Collection Time: 05/05/21  8:45 PM   Specimen: BLOOD RIGHT HAND  Result Value Ref Range Status   Specimen Description BLOOD RIGHT HAND  Final   Special Requests   Final    BOTTLES DRAWN AEROBIC AND ANAEROBIC Blood Culture adequate volume   Culture   Final    NO GROWTH 5 DAYS Performed at [facility], [place] [address], [place], [place] [address]    Report Status 05/11/2021 FINAL  Final  Urine culture     Status: None   Collection Time: 05/05/21 10:36 PM   Specimen: Urine, Random  Result Value Ref Range Status   Specimen Description URINE, RANDOM  Final   Special Requests NONE  Final   Culture   Final    NO GROWTH Performed at [facility], [place] [address], [place], [place] [address]    Report Status 05/07/2021 FINAL  Final  MRSA PCR Screening     Status: None   Collection Time: 05/06/21  8:01 PM   Specimen: Nasopharyngeal  Result Value Ref Range Status   MRSA by PCR NEGATIVE NEGATIVE Final    Comment:        The GeneXpert MRSA Assay (FDA approved for NASAL specimens only), is one component of a comprehensive MRSA colonization surveillance program. It is not intended to diagnose MRSA infection nor to guide  or monitor treatment for MRSA infections. Performed at [facility], [place] [address], [place], [place] [address]   Culture, blood (Routine X 2) w Reflex to ID Panel     Status: None (Preliminary result)   Collection Time: 05/09/21  3:26 PM   Specimen: BLOOD  Result Value Ref Range Status   Specimen Description BLOOD LEFT ANTECUBITAL  Final   Special Requests   Final    BOTTLES DRAWN AEROBIC AND ANAEROBIC Blood Culture adequate  volume   Culture   Final    NO GROWTH 2 DAYS Performed at [facility], [place] [address], [place], [place] [address]    Report Status PENDING  Incomplete  Culture, blood (Routine X 2) w Reflex to ID Panel     Status: None (Preliminary result)   Collection Time: 05/09/21  3:26 PM   Specimen: BLOOD  Result Value Ref Range Status   Specimen Description BLOOD LEFT ANTECUBITAL  Final   Special Requests   Final    BOTTLES DRAWN AEROBIC AND ANAEROBIC Blood Culture adequate volume   Culture   Final    NO GROWTH 2 DAYS Performed at [facility], [place] [address] [place], [place] [address]    Report Status PENDING  Incomplete  Resp Panel by RT-PCR (Flu A&B, Covid) Nasopharyngeal Swab     Status: None   Collection Time: 05/11/21 11:54 AM   Specimen: Nasopharyngeal Swab; Nasopharyngeal(NP) swabs in vial transport medium  Result Value Ref Range Status   SARS Coronavirus 2 by RT PCR NEGATIVE NEGATIVE Final    Comment: (NOTE) SARS-CoV-2 target nucleic acids are NOT DETECTED.  The SARS-CoV-2 RNA is generally detectable in upper respiratory specimens during the acute phase of infection. The lowest concentration of SARS-CoV-2 viral copies this assay can detect is 138 copies/mL. A negative result does not preclude SARS-Cov-2 infection and should not be used as the sole basis for treatment or other patient management decisions. A negative result may occur with  improper specimen collection/handling, submission of specimen other than nasopharyngeal swab, presence of viral mutation(s) within the areas targeted by this assay, and inadequate number of viral copies(<138 copies/mL). A negative result must be combined with clinical observations, patient history, and epidemiological information. The expected result is Negative.  Fact Sheet for Patients:  EntrepreneurPulse.com.au  Fact Sheet for Healthcare Providers:  IncredibleEmployment.be  This test is  no t yet approved or cleared by the [place] FDA and  has been authorized for detection and/or diagnosis of SARS-CoV-2 by FDA under an Emergency Use Authorization (EUA). This EUA will remain  in effect (meaning this test can be used) for the duration of the COVID-19 declaration under Section 564(b)(1) of the Act, 21 U.S.C.section 360bbb-3(b)(1), unless the authorization is terminated  or revoked sooner.       Influenza A by PCR NEGATIVE NEGATIVE Final   Influenza B by PCR NEGATIVE NEGATIVE Final    Comment: (NOTE) The Xpert Xpress SARS-CoV-2/FLU/RSV plus assay is intended as an aid in the diagnosis of influenza from Nasopharyngeal swab specimens and should not be used as a sole basis for treatment. Nasal washings and aspirates are unacceptable for Xpert Xpress SARS-CoV-2/FLU/RSV testing.  Fact Sheet for Patients: EntrepreneurPulse.com.au  Fact Sheet for Healthcare Providers: IncredibleEmployment.be  This test is not yet approved or cleared by the [place] FDA and has been authorized for detection and/or diagnosis of SARS-CoV-2 by FDA under an Emergency Use Authorization (EUA). This EUA will remain in effect (meaning  this test can be used) for the duration of the COVID-19 declaration under Section 564(b)(1) of the Act, 21 U.S.C. section 360bbb-3(b)(1), unless the authorization is terminated or revoked.  Performed at [facility], [place] [address] [place], [place] [address]     Radiology Studies: CT ABDOMEN PELVIS WO CONTRAST  Result Date: 05/10/2021 CLINICAL DATA:  Fever of unknown origin, night sweats EXAM: CT ABDOMEN AND PELVIS WITHOUT CONTRAST TECHNIQUE: Multidetector CT imaging of the abdomen and pelvis was performed following the standard protocol without IV contrast. COMPARISON:  04/16/2020 FINDINGS: Lower chest: The visualized lung bases are clear. Mild to moderate cardiomegaly is stable. Hepatobiliary: The liver contour  is nodular in keeping with changes of underlying cirrhosis. Geographic fatty hepatic infiltration noted within the right hepatic lobe. No definite intrahepatic mass lesion identified on this noncontrast examination. The gallbladder is decompressed but is otherwise unremarkable. No intra or extrahepatic biliary ductal dilation. Pancreas: Unremarkable Spleen: Unremarkable Adrenals/Urinary Tract: Adrenal glands are unremarkable. Kidneys are normal, without renal calculi, focal lesion, or hydronephrosis. Bladder is unremarkable. Stomach/Bowel: Stomach is within normal limits. Appendix appears normal. No evidence of bowel wall thickening, distention, or inflammatory changes. No free intraperitoneal gas or fluid. Vascular/Lymphatic: Mild aortoiliac atherosclerotic calcification. No aortic aneurysm. No pathologic adenopathy within the abdomen and pelvis. Reproductive: Prostate is unremarkable. Other: Small bilateral fat containing inguinal hernias. Tiny fat containing umbilical hernia. Rectum unremarkable. Musculoskeletal: No acute bone abnormality. No lytic or blastic bone lesion. IMPRESSION: No acute intra-abdominal pathology identified. No definite radiographic source for the patient's reported fever. Stable mild-to-moderate cardiomegaly. Stable findings in keeping with cirrhosis and geographic fatty hepatic infiltration. Aortic Atherosclerosis (ICD10-I70.0). Electronically Signed   By: Fidela Salisbury MD   On: 05/10/2021 23:35   VAS [place] ABI WITH/WO TBI  Result Date: 05/11/2021  LOWER EXTREMITY DOPPLER STUDY Patient Name:  FRANCISCOJAVIER WRONSKI  Date of Exam:   05/11/2021 Medical Rec #: [id number]          Accession #:    [id number] Date of Birth: 1961/05/17           Patient Gender: M Patient Age:   60Y Exam Location:  [facility] Procedure:      VAS [place] ABI WITH/WO TBI Referring Phys: [id number] [facility] M PATEL --------------------------------------------------------------------------------  Indications: Bilateral foot  pain. High Risk Factors: Hypertension, hyperlipidemia, Diabetes, no history of                    smoking. Other Factors: CKD3, CHF.  Comparison Study: No previous exams Performing Technologist: Hill, Jody RVT, RDMS  Examination Guidelines: A complete evaluation includes at minimum, Doppler waveform signals and systolic blood pressure reading at the level of bilateral brachial, anterior tibial, and posterior tibial arteries, when vessel segments are accessible. Bilateral testing is considered an integral part of a complete examination. Photoelectric Plethysmograph (PPG) waveforms and toe systolic pressure readings are included as required and additional duplex testing as needed. Limited examinations for reoccurring indications may be performed as noted.  ABI Findings: +---------+------------------+-----+-------------------+--------+ Right    Rt Pressure (mmHg)IndexWaveform           Comment  +---------+------------------+-----+-------------------+--------+ Brachial 106                    triphasic                   +---------+------------------+-----+-------------------+--------+ PTA      71  0.67 dampened monophasic         +---------+------------------+-----+-------------------+--------+ DP       52                0.49 dampened monophasic         +---------+------------------+-----+-------------------+--------+ Great Toe0                 0.00 Absent                      +---------+------------------+-----+-------------------+--------+ +---------+------------------+-----+----------+-----------------+ Left     Lt Pressure (mmHg)IndexWaveform  Comment           +---------+------------------+-----+----------+-----------------+ Brachial 102                    triphasic                   +---------+------------------+-----+----------+-----------------+ PTA      91                0.86 biphasic  auditory biphasic  +---------+------------------+-----+----------+-----------------+ DP       82                0.77 monophasic                  +---------+------------------+-----+----------+-----------------+ Great Toe49                0.46 Abnormal                    +---------+------------------+-----+----------+-----------------+ +-------+-----------+-----------+------------+------------+ ABI/TBIToday's ABIToday's TBIPrevious ABIPrevious TBI +-------+-----------+-----------+------------+------------+ Right  0.67                                           +-------+-----------+-----------+------------+------------+ Left   0.86                                           +-------+-----------+-----------+------------+------------+   Summary: Right: Resting right ankle-brachial index indicates moderate right lower extremity arterial disease. The right toe-brachial index is unobtainable/absent. Left: Resting left ankle-brachial index indicates mild left lower extremity arterial disease. The left toe-brachial index is abnormal.  *See table(s) above for measurements and observations.     Preliminary       Eldrige Pitkin T. Newman  If 7PM-7AM, please contact night-coverage [email or web address] 05/11/2021, 5:01 PM

## 2021-05-11 NOTE — Progress Notes (Signed)
ANTICOAGULATION CONSULT NOTE - Follow Up Consult  Pharmacy Consult for Coumadin Indication: hx aflutter, LV thrombus, bioprosthetic AVR  No Known Allergies  Patient Measurements: Height: 5\' 11"  (180.3 cm) Weight: 120 kg (264 lb 8.8 oz) IBW/kg (Calculated) : 75.3  Vital Signs: Temp: 98.1 F (36.7 C) (05/25 0543) Temp Source: Oral (05/25 0543) BP: 98/51 (05/25 0543) Pulse Rate: 58 (05/25 0543)  Labs: Recent Labs    05/09/21 0434 05/10/21 0359 05/11/21 0405  HGB  --  13.8 13.6  HCT  --  41.7 42.4  PLT  --  369 414*  LABPROT 23.2* 24.9* 24.2*  INR 2.1* 2.3* 2.2*  CREATININE  --  1.40* 1.52*    Estimated Creatinine Clearance: 68.1 mL/min (A) (by C-G formula based on SCr of 1.52 mg/dL (H)).   Assessment: Anticoag: Warfarin pta for hx aflutter, LV thrombus, bioprosthetic AVR. Dopplers negative for new DVT.  -PTA dose Coumadin10 mg MWF, 7.5 mg TTSS with admit INR 2.5  INR continues to be therapeutic at 2.2. We will continue on the home dose and change INR monitoring to MWF since it's stable.   Goal of Therapy:  INR 2-3 Monitor platelets by anticoagulation protocol: Yes   Plan:  Coumadin 7.5mg  qday except 10mg  MWF Change INR to MWF   Onnie Boer, PharmD, BCIDP, AAHIVP, CPP Infectious Disease Pharmacist 05/11/2021 9:01 AM

## 2021-05-11 NOTE — Progress Notes (Signed)
Subjective: Did not have any sweats whatsoever last night   Antibiotics:  Anti-infectives (From admission, onward)   Start     Dose/Rate Route Frequency Ordered Stop   05/07/21 1400  azithromycin (ZITHROMAX) tablet 500 mg  Status:  Discontinued        500 mg Oral Daily 05/07/21 1300 05/09/21 1043      Medications: Scheduled Meds: . colchicine  0.6 mg Oral Daily  . empagliflozin  10 mg Oral QAC breakfast  . furosemide  40 mg Oral Daily  . gabapentin  200 mg Oral TID  . hydrALAZINE  10 mg Oral Q8H  . isosorbide mononitrate  30 mg Oral Daily  . predniSONE  20 mg Oral Q breakfast  . sacubitril-valsartan  1 tablet Oral BID  . spironolactone  12.5 mg Oral Daily  . warfarin  10 mg Oral Once per day on Mon Wed Fri  . warfarin  7.5 mg Oral Once per day on Sun Tue Thu Sat  . Warfarin - Pharmacist Dosing Inpatient   Does not apply q1600   Continuous Infusions: PRN Meds:.acetaminophen **OR** acetaminophen, guaiFENesin    Objective: Weight change:   Intake/Output Summary (Last 24 hours) at 05/11/2021 1226 Last data filed at 05/11/2021 1100 Gross per 24 hour  Intake 1840 ml  Output 2850 ml  Net -1010 ml   Blood pressure 101/70, pulse (!) 49, temperature 98.1 F (36.7 C), temperature source Oral, resp. rate 18, height 5\' 11"  (1.803 m), weight 120 kg, SpO2 94 %. Temp:  [97.8 F (36.6 C)-98.1 F (36.7 C)] 98.1 F (36.7 C) (05/25 1005) Pulse Rate:  [49-70] 49 (05/25 1005) Resp:  [16-20] 18 (05/25 1005) BP: (98-113)/(51-89) 101/70 (05/25 1005) SpO2:  [94 %-97 %] 94 % (05/25 1005)  Physical Exam: Physical Exam Constitutional:      Appearance: He is well-developed.  HENT:     Head: Normocephalic and atraumatic.  Eyes:     Conjunctiva/sclera: Conjunctivae normal.  Cardiovascular:     Rate and Rhythm: Normal rate and regular rhythm.     Heart sounds: No murmur heard. No gallop.   Pulmonary:     Effort: Pulmonary effort is normal. No respiratory distress.      Breath sounds: Normal breath sounds. No stridor. No wheezing or rhonchi.  Abdominal:     General: There is no distension.     Palpations: Abdomen is soft.  Musculoskeletal:     Right wrist: No swelling, deformity, effusion or tenderness. Decreased range of motion.     Cervical back: Normal range of motion and neck supple.     Comments: His range of motion in both feet and ankles have improved  Skin:    General: Skin is warm and dry.     Findings: No erythema or rash.  Neurological:     General: No focal deficit present.     Mental Status: He is alert and oriented to person, place, and time.  Psychiatric:        Mood and Affect: Mood normal.        Behavior: Behavior normal.        Thought Content: Thought content normal.        Judgment: Judgment normal.      CBC:    BMET Recent Labs    05/10/21 0359 05/11/21 0405  NA 134* 134*  K 4.4 4.7  CL 102 99  CO2 24 26  GLUCOSE 143* 141*  BUN 26* 32*  CREATININE 1.40* 1.52*  CALCIUM 9.5 10.0     Liver Panel  Recent Labs    05/10/21 0359 05/11/21 0405  PROT 7.0 7.2  ALBUMIN 3.0* 3.1*  AST 13* 16  ALT 18 21  ALKPHOS 88 89  BILITOT 1.1 0.6       Sedimentation Rate Recent Labs    05/10/21 0359  ESRSEDRATE 59*   C-Reactive Protein Recent Labs    05/10/21 0359 05/11/21 0405  CRP 6.5* 3.6*    Micro Results: Recent Results (from the past 720 hour(s))  Resp Panel by RT-PCR (Flu A&B, Covid) Nasopharyngeal Swab     Status: None   Collection Time: 05/03/21  3:47 AM   Specimen: Nasopharyngeal Swab; Nasopharyngeal(NP) swabs in vial transport medium  Result Value Ref Range Status   SARS Coronavirus 2 by RT PCR NEGATIVE NEGATIVE Final    Comment: (NOTE) SARS-CoV-2 target nucleic acids are NOT DETECTED.  The SARS-CoV-2 RNA is generally detectable in upper respiratory specimens during the acute phase of infection. The lowest concentration of SARS-CoV-2 viral copies this assay can detect is 138 copies/mL. A  negative result does not preclude SARS-Cov-2 infection and should not be used as the sole basis for treatment or other patient management decisions. A negative result may occur with  improper specimen collection/handling, submission of specimen other than nasopharyngeal swab, presence of viral mutation(s) within the areas targeted by this assay, and inadequate number of viral copies(<138 copies/mL). A negative result must be combined with clinical observations, patient history, and epidemiological information. The expected result is Negative.  Fact Sheet for Patients:  EntrepreneurPulse.com.au  Fact Sheet for Healthcare Providers:  IncredibleEmployment.be  This test is no t yet approved or cleared by the [place] FDA and  has been authorized for detection and/or diagnosis of SARS-CoV-2 by FDA under an Emergency Use Authorization (EUA). This EUA will remain  in effect (meaning this test can be used) for the duration of the COVID-19 declaration under Section 564(b)(1) of the Act, 21 U.S.C.section 360bbb-3(b)(1), unless the authorization is terminated  or revoked sooner.       Influenza A by PCR NEGATIVE NEGATIVE Final   Influenza B by PCR NEGATIVE NEGATIVE Final    Comment: (NOTE) The Xpert Xpress SARS-CoV-2/FLU/RSV plus assay is intended as an aid in the diagnosis of influenza from Nasopharyngeal swab specimens and should not be used as a sole basis for treatment. Nasal washings and aspirates are unacceptable for Xpert Xpress SARS-CoV-2/FLU/RSV testing.  Fact Sheet for Patients: EntrepreneurPulse.com.au  Fact Sheet for Healthcare Providers: IncredibleEmployment.be  This test is not yet approved or cleared by the [place] FDA and has been authorized for detection and/or diagnosis of SARS-CoV-2 by FDA under an Emergency Use Authorization (EUA). This EUA will remain in effect (meaning this test can  be used) for the duration of the COVID-19 declaration under Section 564(b)(1) of the Act, 21 U.S.C. section 360bbb-3(b)(1), unless the authorization is terminated or revoked.  Performed at [facility], [place] [address], [place], [place] [address]   Blood culture (routine x 2)     Status: None   Collection Time: 05/03/21  4:15 AM   Specimen: BLOOD  Result Value Ref Range Status   Specimen Description BLOOD SITE NOT SPECIFIED  Final   Special Requests   Final    BOTTLES DRAWN AEROBIC AND ANAEROBIC Blood Culture results may not be optimal due to an inadequate volume of blood received in culture bottles   Culture   Final  NO GROWTH 5 DAYS Performed at [facility], [place] [address], [place], [place] [address]    Report Status 05/08/2021 FINAL  Final  Blood culture (routine x 2)     Status: None   Collection Time: 05/03/21  4:17 AM   Specimen: BLOOD  Result Value Ref Range Status   Specimen Description BLOOD SITE NOT SPECIFIED  Final   Special Requests   Final    BOTTLES DRAWN AEROBIC AND ANAEROBIC Blood Culture results may not be optimal due to an inadequate volume of blood received in culture bottles   Culture   Final    NO GROWTH 5 DAYS Performed at [facility], [place] [address] [place], [place] [address]    Report Status 05/08/2021 FINAL  Final  Resp Panel by RT-PCR (Flu A&B, Covid) Nasopharyngeal Swab     Status: None   Collection Time: 05/05/21  8:24 PM   Specimen: Nasopharyngeal Swab; Nasopharyngeal(NP) swabs in vial transport medium  Result Value Ref Range Status   SARS Coronavirus 2 by RT PCR NEGATIVE NEGATIVE Final    Comment: (NOTE) SARS-CoV-2 target nucleic acids are NOT DETECTED.  The SARS-CoV-2 RNA is generally detectable in upper respiratory specimens during the acute phase of infection. The lowest concentration of SARS-CoV-2 viral copies this assay can detect is 138 copies/mL. A negative result does not preclude SARS-Cov-2 infection and should  not be used as the sole basis for treatment or other patient management decisions. A negative result may occur with  improper specimen collection/handling, submission of specimen other than nasopharyngeal swab, presence of viral mutation(s) within the areas targeted by this assay, and inadequate number of viral copies(<138 copies/mL). A negative result must be combined with clinical observations, patient history, and epidemiological information. The expected result is Negative.  Fact Sheet for Patients:  EntrepreneurPulse.com.au  Fact Sheet for Healthcare Providers:  IncredibleEmployment.be  This test is no t yet approved or cleared by the [place] FDA and  has been authorized for detection and/or diagnosis of SARS-CoV-2 by FDA under an Emergency Use Authorization (EUA). This EUA will remain  in effect (meaning this test can be used) for the duration of the COVID-19 declaration under Section 564(b)(1) of the Act, 21 U.S.C.section 360bbb-3(b)(1), unless the authorization is terminated  or revoked sooner.       Influenza A by PCR NEGATIVE NEGATIVE Final   Influenza B by PCR NEGATIVE NEGATIVE Final    Comment: (NOTE) The Xpert Xpress SARS-CoV-2/FLU/RSV plus assay is intended as an aid in the diagnosis of influenza from Nasopharyngeal swab specimens and should not be used as a sole basis for treatment. Nasal washings and aspirates are unacceptable for Xpert Xpress SARS-CoV-2/FLU/RSV testing.  Fact Sheet for Patients: EntrepreneurPulse.com.au  Fact Sheet for Healthcare Providers: IncredibleEmployment.be  This test is not yet approved or cleared by the [place] FDA and has been authorized for detection and/or diagnosis of SARS-CoV-2 by FDA under an Emergency Use Authorization (EUA). This EUA will remain in effect (meaning this test can be used) for the duration of the COVID-19 declaration under  Section 564(b)(1) of the Act, 21 U.S.C. section 360bbb-3(b)(1), unless the authorization is terminated or revoked.  Performed at [facility], [place] [address] [place], [place] [address]   Culture, blood (routine x 2)     Status: None   Collection Time: 05/05/21  8:40 PM   Specimen: BLOOD  Result Value Ref Range Status   Specimen Description BLOOD RIGHT ANTECUBITAL  Final  Special Requests   Final    BOTTLES DRAWN AEROBIC AND ANAEROBIC Blood Culture adequate volume   Culture   Final    NO GROWTH 5 DAYS Performed at [facility], [place] [address] [place], [place] [address]    Report Status 05/11/2021 FINAL  Final  Culture, blood (routine x 2)     Status: None   Collection Time: 05/05/21  8:45 PM   Specimen: BLOOD RIGHT HAND  Result Value Ref Range Status   Specimen Description BLOOD RIGHT HAND  Final   Special Requests   Final    BOTTLES DRAWN AEROBIC AND ANAEROBIC Blood Culture adequate volume   Culture   Final    NO GROWTH 5 DAYS Performed at [facility], [place] [address] [place], [place] [address]    Report Status 05/11/2021 FINAL  Final  Urine culture     Status: None   Collection Time: 05/05/21 10:36 PM   Specimen: Urine, Random  Result Value Ref Range Status   Specimen Description URINE, RANDOM  Final   Special Requests NONE  Final   Culture   Final    NO GROWTH Performed at [facility], [place] [address], [place], [place] [address]    Report Status 05/07/2021 FINAL  Final  MRSA PCR Screening     Status: None   Collection Time: 05/06/21  8:01 PM   Specimen: Nasopharyngeal  Result Value Ref Range Status   MRSA by PCR NEGATIVE NEGATIVE Final    Comment:        The GeneXpert MRSA Assay (FDA approved for NASAL specimens only), is one component of a comprehensive MRSA colonization surveillance program. It is not intended to diagnose MRSA infection nor to guide or monitor treatment for MRSA infections. Performed at [facility],  [place] [address], [place], [place] [address]   Culture, blood (Routine X 2) w Reflex to ID Panel     Status: None (Preliminary result)   Collection Time: 05/09/21  3:26 PM   Specimen: BLOOD  Result Value Ref Range Status   Specimen Description BLOOD LEFT ANTECUBITAL  Final   Special Requests   Final    BOTTLES DRAWN AEROBIC AND ANAEROBIC Blood Culture adequate volume   Culture   Final    NO GROWTH 2 DAYS Performed at [facility], [place] [address], [place], [place] [address]    Report Status PENDING  Incomplete  Culture, blood (Routine X 2) w Reflex to ID Panel     Status: None (Preliminary result)   Collection Time: 05/09/21  3:26 PM   Specimen: BLOOD  Result Value Ref Range Status   Specimen Description BLOOD LEFT ANTECUBITAL  Final   Special Requests   Final    BOTTLES DRAWN AEROBIC AND ANAEROBIC Blood Culture adequate volume   Culture   Final    NO GROWTH 2 DAYS Performed at [facility], [place] [address] [place], [place] [address]    Report Status PENDING  Incomplete    Studies/Results: CT ABDOMEN PELVIS WO CONTRAST  Result Date: 05/10/2021 CLINICAL DATA:  Fever of unknown origin, night sweats EXAM: CT ABDOMEN AND PELVIS WITHOUT CONTRAST TECHNIQUE: Multidetector CT imaging of the abdomen and pelvis was performed following the standard protocol without IV contrast. COMPARISON:  04/16/2020 FINDINGS: Lower chest: The visualized lung bases are clear. Mild to moderate cardiomegaly is stable. Hepatobiliary: The liver contour is nodular in keeping with changes of underlying cirrhosis. Geographic fatty hepatic  infiltration noted within the right hepatic lobe. No definite intrahepatic mass lesion identified on this noncontrast examination. The gallbladder is decompressed but is otherwise unremarkable. No intra or extrahepatic biliary ductal dilation. Pancreas: Unremarkable Spleen: Unremarkable Adrenals/Urinary Tract: Adrenal glands are unremarkable. Kidneys are normal, without renal  calculi, focal lesion, or hydronephrosis. Bladder is unremarkable. Stomach/Bowel: Stomach is within normal limits. Appendix appears normal. No evidence of bowel wall thickening, distention, or inflammatory changes. No free intraperitoneal gas or fluid. Vascular/Lymphatic: Mild aortoiliac atherosclerotic calcification. No aortic aneurysm. No pathologic adenopathy within the abdomen and pelvis. Reproductive: Prostate is unremarkable. Other: Small bilateral fat containing inguinal hernias. Tiny fat containing umbilical hernia. Rectum unremarkable. Musculoskeletal: No acute bone abnormality. No lytic or blastic bone lesion. IMPRESSION: No acute intra-abdominal pathology identified. No definite radiographic source for the patient's reported fever. Stable mild-to-moderate cardiomegaly. Stable findings in keeping with cirrhosis and geographic fatty hepatic infiltration. Aortic Atherosclerosis (ICD10-I70.0). Electronically Signed   By: Fidela Salisbury MD   On: 05/10/2021 23:35   MR FOOT RIGHT WO CONTRAST  Result Date: 05/09/2021 CLINICAL DATA:  Bilateral foot and ankle pain and swelling. The patient is unable to walk. Fever. EXAM: MRI OF THE RIGHT FOREFOOT WITHOUT CONTRAST; MRI OF THE RIGHT ANKLE WITHOUT CONTRAST TECHNIQUE: Multiplanar, multisequence MR imaging of the right ankle and forefoot was performed. No intravenous contrast was administered. COMPARISON:  Plain films right foot 05/05/2022. FINDINGS: Bones/Joint/Cartilage Marrow edema is seen throughout almost the entire medial cuneiform and about the second, third and fourth tarsometatarsal joints. Edema appears worst at the articulation of the middle and medial cuneiforms and the second tarsometatarsal joint. An intraosseous geode is seen in the talus at the posterior facet of the subtalar joint. No joint effusion is identified. No fracture. Ligaments Intact. Muscles and Tendons No intramuscular fluid collection. There is mild fatty atrophy of intrinsic  musculature the foot. Soft tissues Mild subcutaneous edema about the foot and ankle is noted. IMPRESSION: Edema about the first through fourth tarsometatarsal joints and articulation of the middle and medial cuneiforms is nonspecific but likely due to degenerative or stress change rather than osteomyelitis. Negative for abscess or joint effusion. Mild subcutaneous edema about the dorsum of the foot could be due to dependent change or cellulitis. Electronically Signed   By: Inge Rise M.D.   On: 05/09/2021 13:27   MR FOOT LEFT WO CONTRAST  Result Date: 05/09/2021 CLINICAL DATA:  Left foot and ankle pain. History of fevers. The patient is unable to walk. Question septic arthritis. EXAM: MRI OF THE LEFT FOOT WITHOUT CONTRAST; MRI OF THE LEFT ANKLE WITHOUT CONTRAST TECHNIQUE: Multiplanar, multisequence MR imaging of the left foot and ankle was performed. No intravenous contrast was administered. COMPARISON:  Plain films left foot 05/05/2021. FINDINGS: Bones/Joint/Cartilage This examination is somewhat degraded by patient motion. No joint effusion is seen. No fracture or stress change. Mild marrow edema is seen about the second, third and fourth tarsometatarsal joints. Imaged bones otherwise appear normal. Ligaments Intact and normal in appearance. Muscles and Tendons No intramuscular fluid collection is identified. No tendon tear or strain. There is mild atrophy of intrinsic musculature the foot. Soft tissues No focal fluid collection is identified. Mild to moderate subcutaneous edema over the dorsum of the foot and about the ankle is noted. IMPRESSION: Mild marrow edema about the second, third and fourth tarsometatarsal joints without effusion is likely due to stress change or mild degenerative disease. The appearance is not typical of osteomyelitis although infection cannot be completely excluded.  Negative for abscess. Subcutaneous edema about the foot and ankle could be due to dependent change or  cellulitis. Electronically Signed   By: Inge Rise M.D.   On: 05/09/2021 13:20   MR ANKLE RIGHT WO CONTRAST  Result Date: 05/09/2021 CLINICAL DATA:  Bilateral foot and ankle pain and swelling. The patient is unable to walk. Fever. EXAM: MRI OF THE RIGHT FOREFOOT WITHOUT CONTRAST; MRI OF THE RIGHT ANKLE WITHOUT CONTRAST TECHNIQUE: Multiplanar, multisequence MR imaging of the right ankle and forefoot was performed. No intravenous contrast was administered. COMPARISON:  Plain films right foot 05/05/2022. FINDINGS: Bones/Joint/Cartilage Marrow edema is seen throughout almost the entire medial cuneiform and about the second, third and fourth tarsometatarsal joints. Edema appears worst at the articulation of the middle and medial cuneiforms and the second tarsometatarsal joint. An intraosseous geode is seen in the talus at the posterior facet of the subtalar joint. No joint effusion is identified. No fracture. Ligaments Intact. Muscles and Tendons No intramuscular fluid collection. There is mild fatty atrophy of intrinsic musculature the foot. Soft tissues Mild subcutaneous edema about the foot and ankle is noted. IMPRESSION: Edema about the first through fourth tarsometatarsal joints and articulation of the middle and medial cuneiforms is nonspecific but likely due to degenerative or stress change rather than osteomyelitis. Negative for abscess or joint effusion. Mild subcutaneous edema about the dorsum of the foot could be due to dependent change or cellulitis. Electronically Signed   By: Inge Rise M.D.   On: 05/09/2021 13:27   MR ANKLE LEFT WO CONTRAST  Result Date: 05/09/2021 CLINICAL DATA:  Left foot and ankle pain. History of fevers. The patient is unable to walk. Question septic arthritis. EXAM: MRI OF THE LEFT FOOT WITHOUT CONTRAST; MRI OF THE LEFT ANKLE WITHOUT CONTRAST TECHNIQUE: Multiplanar, multisequence MR imaging of the left foot and ankle was performed. No intravenous contrast was  administered. COMPARISON:  Plain films left foot 05/05/2021. FINDINGS: Bones/Joint/Cartilage This examination is somewhat degraded by patient motion. No joint effusion is seen. No fracture or stress change. Mild marrow edema is seen about the second, third and fourth tarsometatarsal joints. Imaged bones otherwise appear normal. Ligaments Intact and normal in appearance. Muscles and Tendons No intramuscular fluid collection is identified. No tendon tear or strain. There is mild atrophy of intrinsic musculature the foot. Soft tissues No focal fluid collection is identified. Mild to moderate subcutaneous edema over the dorsum of the foot and about the ankle is noted. IMPRESSION: Mild marrow edema about the second, third and fourth tarsometatarsal joints without effusion is likely due to stress change or mild degenerative disease. The appearance is not typical of osteomyelitis although infection cannot be completely excluded. Negative for abscess. Subcutaneous edema about the foot and ankle could be due to dependent change or cellulitis. Electronically Signed   By: Inge Rise M.D.   On: 05/09/2021 13:20      Assessment/Plan:  INTERVAL HISTORY: CT of the abdomen pelvis was unremarkable and fevers have disappeared along with night sweats   Principal Problem:   Fever of unknown origin Active Problems:   Congestive dilated cardiomyopathy (HCC)   Essential hypertension   CKD (chronic kidney disease), stage II-III   S/P AVR   Fever    [name] is a 60 y.o. male with admission with severe foot and ankle pain and high fevers, history of aortic valve replacement, drenching night sweats while in the hospital.  #1 FUO:  Not clear to me why he had his  high fever he had and now with his MRI findings I cannot really relate his pain and inability to walk there to an infectious etiology.  Already had a CT of the chest which was negative he has had blood cultures that were negative and repeat  blood cultures taken yesterday though he was on a macrolide at the time.  2D echocardiogram does not show evidence of endocarditis  T abdomen pelvis was negative  I discussed with him pros and cons of trying to pursue a transesophageal echocardiogram.  I do not feel at this point in time that his really worth the risk and he agrees to that.  I have called Trish with cardiology to cancel the TEE.  #2  Foot ankle and wrist pain: Seems to be improving on corticosteroids and colchicine consistent with likely gout  I spent greater than 35 minutes with the patient including greater than 50% of time in face to face counsel of the patient review of his radiographic data laboratory data and microbiological data and in coordination of his care.  I will sign off for now please call with further questions..   LOS: 2 days   Alcide Evener 05/11/2021, 12:26 PM

## 2021-05-11 NOTE — Progress Notes (Signed)
CSW spoke with patient at bedside - he states he wants a facility where he can get a private room.  CSW spoke with admissions at [facility], [facility], and [facility] who all state the patient would have his own private room.  Loma Boston at [facility] to [employer] authorization - CSW updated patient on information.  Madilyn Fireman, MSW, LCSW Transitions of Care  Clinical Social Worker II [phone]

## 2021-05-11 NOTE — Progress Notes (Signed)
ABI exam has been completed.    Results can be found under chart review under CV PROC. 05/11/2021 2:49 PM Benzion Mesta RVT, RDMS

## 2021-05-11 NOTE — Progress Notes (Signed)
PT Cancellation Note  Patient Details Name: [name] MRN: [id number] DOB: [birth year]   Cancelled Treatment:    Reason Eval/Treat Not Completed: Patient declined, no reason specified. Patient said he walked earlier with family. Plans to walk again later. Declines PT at this time. Will re-attempt tomorrow.     Jayjay Littles 05/11/2021, 3:37 PM

## 2021-05-12 DIAGNOSIS — I420 Dilated cardiomyopathy: Secondary | ICD-10-CM | POA: Diagnosis not present

## 2021-05-12 DIAGNOSIS — N182 Chronic kidney disease, stage 2 (mild): Secondary | ICD-10-CM | POA: Diagnosis not present

## 2021-05-12 DIAGNOSIS — I10 Essential (primary) hypertension: Secondary | ICD-10-CM | POA: Diagnosis not present

## 2021-05-12 DIAGNOSIS — R509 Fever, unspecified: Secondary | ICD-10-CM | POA: Diagnosis not present

## 2021-05-12 LAB — RENAL FUNCTION PANEL
Albumin: 2.9 g/dL — ABNORMAL LOW (ref 3.5–5.0)
Anion gap: 5 (ref 5–15)
BUN: 35 mg/dL — ABNORMAL HIGH (ref 6–20)
CO2: 30 mmol/L (ref 22–32)
Calcium: 9.5 mg/dL (ref 8.9–10.3)
Chloride: 101 mmol/L (ref 98–111)
Creatinine, Ser: 1.65 mg/dL — ABNORMAL HIGH (ref 0.61–1.24)
GFR, Estimated: 47 mL/min — ABNORMAL LOW (ref >60)
Glucose, Bld: 139 mg/dL — ABNORMAL HIGH (ref 70–99)
Phosphorus: 4.9 mg/dL — ABNORMAL HIGH (ref 2.5–4.6)
Potassium: 4.5 mmol/L (ref 3.5–5.1)
Sodium: 136 mmol/L (ref 135–145)

## 2021-05-12 LAB — LIPID PANEL
Cholesterol: 174 mg/dL (ref 0–200)
HDL: 28 mg/dL — ABNORMAL LOW (ref >40)
LDL Cholesterol: 121 mg/dL — ABNORMAL HIGH (ref 0–99)
Total CHOL/HDL Ratio: 6.2 RATIO
Triglycerides: 126 mg/dL (ref <150)
VLDL: 25 mg/dL (ref 0–40)

## 2021-05-12 LAB — GLUCOSE, CAPILLARY
Glucose-Capillary: 113 mg/dL — ABNORMAL HIGH (ref 70–99)
Glucose-Capillary: 148 mg/dL — ABNORMAL HIGH (ref 70–99)
Glucose-Capillary: 176 mg/dL — ABNORMAL HIGH (ref 70–99)

## 2021-05-12 LAB — HEPATITIS PANEL, ACUTE
HCV Ab: NONREACTIVE
Hep A IgM: NONREACTIVE
Hep B C IgM: NONREACTIVE
Hepatitis B Surface Ag: NONREACTIVE

## 2021-05-12 LAB — MAGNESIUM: Magnesium: 2.5 mg/dL — ABNORMAL HIGH (ref 1.7–2.4)

## 2021-05-12 MED ORDER — SACUBITRIL-VALSARTAN 49-51 MG PO TABS
1.0000 | ORAL_TABLET | Freq: Two times a day (BID) | ORAL | Status: DC
  Administered 2021-05-12 – 2021-05-14 (×5): 1 via ORAL
  Filled 2021-05-12 (×6): qty 1

## 2021-05-12 NOTE — TOC Progression Note (Signed)
[facility] - Pro[facility] Note    Patient Details  Name: [name] MRN: [id number] Date of Birth: 1961/06/02  [facility]) CM/SW Contact  Jonathon Salina Mila Homer, LCSW Phone Number: 05/12/2021, 5:18 PM  Clinical Narrative:  Call made to [name], admissions director at [facility] (10:11 am) regarding patient. CSW informed that [name] is still pending and [name] number is [phone]. CSW was asked if patient vaccinated and was informed that CSW will talk with patient. Antionetta also informed CSW that she will needs patient's MAR, and labs at discharge and also needs to know who will come to sign his admissions paperwork.  Visited with [name] and updated him on awaiting insurance auth for him to discharge to [facility] and information needed by the SNF at discharge. Patient reported that he is vaccinated and provided his COVID card - 2 vaccinations: 07/10/20 and 07/31/20 and 3rd booster dose 10/22/20.  A copy made and placed in patient's chart and copy made for SNF.  CSW will follow-up with admissions director on Friday.    Expected Discharge Plan: [place] Barriers to Discharge: Continued Medical Work up  Expected Discharge Plan and Services Expected Discharge Plan: [place] In-house Referral: Clinical Social Work Discharge Planning Services: CM Consult Post Acute Care Choice: [place] Living arrangements for the past 2 months: Apartment Expected Discharge Date: 05/11/21                                     Social Determinants of Health (SDOH) Interventions    Readmission Risk Interventions No flowsheet data found.

## 2021-05-12 NOTE — Plan of Care (Signed)
  Problem: Activity: Goal: Risk for activity intolerance will decrease Outcome: Progressing   

## 2021-05-12 NOTE — Progress Notes (Signed)
PROGRESS NOTE  AMIRI RIECHERS OQH:[id number] DOB: May 29, 1961   PCP: Flossie Buffy, NP  Patient is from: Home  DOA: 05/05/2021 LOS: 3  Chief complaints: Bilateral feet pain, swelling and productive cough  Brief Narrative / Interim history: 60 year old M with PMH of DM-2, AVR on Coumadin, HTN, systolic CHF, TWS-5K and obesity presenting with bilateral feet pain and swelling as well as productive cough, and admitted for fever of unknown origin, bilateral feet pain and acute on chronic systolic CHF.  Patient was febrile to 101.5 on presentation.  Infectious work-up including cultures, bilateral foot and ankle x-ray and MRI, TTE, and CT chest/abdomen/pelvis unrevealing.  Infectious disease consulted.  Patient received azithromycin for 3 days.  Uric acid was elevated, and he was a started on colchicine and prednisone.  ABI also consistent with bilateral PAD.  Started on atorvastatin and aspirin.  He needs outpatient follow-up with vascular surgery.  Therapy recommended SNF.  TOC working on this.  Subjective: Seen and examined earlier this morning.  No major events overnight of this morning.  Reports improvement in his bilateral feet pain.  Denies chest pain, shortness of breath, GI or UTI symptoms.  Objective: Vitals:   05/11/21 1736 05/11/21 2108 05/12/21 0534 05/12/21 1006  BP: 104/73 107/65 (!) 95/55 99/60  Pulse: (!) 54 (!) 59 (!) 51 (!) 56  Resp: 19 18 18 18   Temp: (!) 97.4 F (36.3 C) 98.2 F (36.8 C) 98.4 F (36.9 C) 97.7 F (36.5 C)  TempSrc: Oral Oral Oral Oral  SpO2: 94% 96% 96% 100%  Weight:      Height:        Intake/Output Summary (Last 24 hours) at 05/12/2021 1357 Last data filed at 05/12/2021 1015 Gross per 24 hour  Intake 960 ml  Output 1500 ml  Net -540 ml   Filed Weights   05/06/21 0137 05/06/21 1610  Weight: 115.7 kg 120 kg    Examination:  GENERAL: No apparent distress.  Nontoxic. HEENT: MMM.  Vision and hearing grossly intact.  NECK: Supple.  No  apparent JVD.  RESP: On RA no IWOB.  Fair aeration bilaterally. CVS:  RRR. Heart sounds normal.  ABD/GI/GU: BS+. Abd soft, NTND.  MSK/EXT:  Moves extremities. No apparent deformity. No edema.  SKIN: no apparent skin lesion or wound NEURO: Awake and alert. Oriented appropriately.  No apparent focal neuro deficit. PSYCH: Calm. Normal affect.   Procedures:  None  Microbiology summarized: CLEXN-17 and influenza PCR nonreactive MRSA PCR nonreactive Urine culture negative Blood cultures negative on 5/19 and 5/23  Assessment & Plan: Bilateral feet pain/bilateral PAD-likely due to gout and neuropathy.  ABI with bilateral PAD.  Uric acid elevated.  Inflammatory markers elevated but improved.  Otherwise, extensive unrevealing work-up as above -Continue colchicine and prednisone -Continue aspirin and atorvastatin for PAD -Outpatient follow-up with vascular surgery for PAD  Right wrist pain: Could be due to gout -Treatment as above  Fever of unknown origin: likely due to gout.  Last true fever was on 5/19 Drenching night sweats -Infectious work-up unrevealing. -ID signed off.  Acute on chronic systolic heart failure: TTE with LVEF of 20 to 25%, GH, moderate LVH mildly reduced RVSF, moderate LAE.  Appears euvolemic on exam.  BNP was 1072.  Heart 1.9 L UOP/24 hours.  Net -5 L.  Cr slightly up to 1.65 from 1.52.  Soft blood pressures -Continue home p.o. Lasix 40 mg daily -Decrease Entresto.  Stop Aldactone -Continue Imdur, hydralazine and Jardiance -No room for beta-blocker  due to bradycardia -Monitor fluid status and renal function  Elevated troponin: Peaked at 84.  Likely demand ischemia -Cardiac meds as above -Added aspirin and atorvastatin given PAD  Essential hypertension: Normotensive. -Cardiac meds as above  History of aortic valve replacement: INR therapeutic -Continue Coumadin per pharmacy  Uncontrolled DM-2 with neuropathy, hyperglycemia and macrovascular complication:  H4V 4.2%.  On Jardiance at home.  Hyperglycemia likely due to steroid. No results for input(s): HGBA1C in the last 72 hours. Recent Labs  Lab 05/11/21 1228 05/11/21 1618 05/11/21 2106 05/12/21 0955 05/12/21 1228  GLUCAP 135* 198* 136* 113* 148*  -Continue Jardiance -Continue Tradjenta-added here -Continue SSI-sensitive.  CKD-3A/azotemia: Relatively stable. Recent Labs    01/11/21 1456 01/13/21 1616 01/17/21 1537 02/09/21 1448 05/02/21 2033 05/05/21 1600 05/06/21 1321 05/10/21 0359 05/11/21 0405 05/12/21 0351  BUN 16 21* 22* 21* 19 23* 28* 26* 32* 35*  CREATININE 1.68* 1.84* 1.74* 1.65* 1.70* 1.66* 1.69* 1.40* 1.52* 1.65*  -Continue monitoring  Liver cirrhosis: Incidental finding on CT abdomen and pelvis.  Reports alcohol in the past.  Acute hepatitis panel negative. -Encourage weight loss  Morbid obesity Body mass index is 36.9 kg/m.         DVT prophylaxis:   warfarin (COUMADIN) tablet 7.5 mg  warfarin (COUMADIN) tablet 10 mg  Code Status: Full code Family Communication: Patient and/or RN. Available if any question.  Level of care: Med-Surg Status is: Inpatient  Remains inpatient appropriate because:Unsafe d/c plan   Dispo: The patient is from: Home              Anticipated d/c is to: SNF              Patient currently is medically stable to d/c.   Difficult to place patient No       Consultants:  Infectious disease-signed off   Sch Meds:  Scheduled Meds: . aspirin EC  81 mg Oral Daily  . atorvastatin  40 mg Oral q1800  . colchicine  0.6 mg Oral Daily  . empagliflozin  10 mg Oral QAC breakfast  . furosemide  40 mg Oral Daily  . gabapentin  200 mg Oral TID  . hydrALAZINE  10 mg Oral Q8H  . insulin aspart  0-5 Units Subcutaneous QHS  . insulin aspart  0-9 Units Subcutaneous TID WC  . isosorbide mononitrate  30 mg Oral Daily  . linagliptin  5 mg Oral Daily  . predniSONE  20 mg Oral Q breakfast  . sacubitril-valsartan  1 tablet Oral BID   . warfarin  10 mg Oral Once per day on Mon Wed Fri  . warfarin  7.5 mg Oral Once per day on Sun Tue Thu Sat  . Warfarin - Pharmacist Dosing Inpatient   Does not apply q1600   Continuous Infusions: PRN Meds:.acetaminophen **OR** acetaminophen, guaiFENesin  Antimicrobials: Anti-infectives (From admission, onward)   Start     Dose/Rate Route Frequency Ordered Stop   05/07/21 1400  azithromycin (ZITHROMAX) tablet 500 mg  Status:  Discontinued        500 mg Oral Daily 05/07/21 1300 05/09/21 1043       I have personally reviewed the following labs and images: CBC: Recent Labs  Lab 05/05/21 1600 05/06/21 1321 05/10/21 0359 05/11/21 0405  WBC 12.6* 11.3* 12.7* 14.7*  NEUTROABS 10.2*  --   --   --   HGB 14.2 14.1 13.8 13.6  HCT 44.9 42.7 41.7 42.4  MCV 87.5 86.6 84.9 86.2  PLT 260 285  369 414*   BMP &GFR Recent Labs  Lab 05/05/21 1600 05/06/21 1321 05/10/21 0359 05/11/21 0405 05/12/21 0351  NA 134* 133* 134* 134* 136  K 4.8 4.8 4.4 4.7 4.5  CL 100 99 102 99 101  CO2 24 24 24 26 30   GLUCOSE 116* 131* 143* 141* 139*  BUN 23* 28* 26* 32* 35*  CREATININE 1.66* 1.69* 1.40* 1.52* 1.65*  CALCIUM 9.2 8.8* 9.5 10.0 9.5  MG  --   --   --   --  2.5*  PHOS  --   --   --   --  4.9*   Estimated Creatinine Clearance: 62.8 mL/min (A) (by C-G formula based on SCr of 1.65 mg/dL (H)). Liver & Pancreas: Recent Labs  Lab 05/05/21 1600 05/06/21 1321 05/10/21 0359 05/11/21 0405 05/12/21 0351  AST 25 23 13* 16  --   ALT 21 20 18 21   --   ALKPHOS 93 91 88 89  --   BILITOT 2.1* 1.4* 1.1 0.6  --   PROT 7.5 7.0 7.0 7.2  --   ALBUMIN 3.3* 3.0* 3.0* 3.1* 2.9*   No results for input(s): LIPASE, AMYLASE in the last 168 hours. No results for input(s): AMMONIA in the last 168 hours. Diabetic: No results for input(s): HGBA1C in the last 72 hours. Recent Labs  Lab 05/11/21 1228 05/11/21 1618 05/11/21 2106 05/12/21 0955 05/12/21 1228  GLUCAP 135* 198* 136* 113* 148*   Cardiac  Enzymes: No results for input(s): CKTOTAL, CKMB, CKMBINDEX, TROPONINI in the last 168 hours. No results for input(s): PROBNP in the last 8760 hours. Coagulation Profile: Recent Labs  Lab 05/07/21 0228 05/08/21 0148 05/09/21 0434 05/10/21 0359 05/11/21 0405  INR 3.1* 2.7* 2.1* 2.3* 2.2*   Thyroid Function Tests: No results for input(s): TSH, T4TOTAL, FREET4, T3FREE, THYROIDAB in the last 72 hours. Lipid Profile: Recent Labs    05/12/21 0351  CHOL 174  HDL 28*  LDLCALC 121*  TRIG 126  CHOLHDL 6.2   Anemia Panel: No results for input(s): VITAMINB12, FOLATE, FERRITIN, TIBC, IRON, RETICCTPCT in the last 72 hours. Urine analysis:    Component Value Date/Time   COLORURINE YELLOW 05/05/2021 2236   APPEARANCEUR CLEAR 05/05/2021 2236   LABSPEC 1.017 05/05/2021 2236   PHURINE 5.0 05/05/2021 2236   GLUCOSEU >=500 (A) 05/05/2021 2236   HGBUR NEGATIVE 05/05/2021 2236   BILIRUBINUR NEGATIVE 05/05/2021 2236   KETONESUR NEGATIVE 05/05/2021 2236   PROTEINUR 100 (A) 05/05/2021 2236   UROBILINOGEN 1.0 04/17/2015 1348   NITRITE NEGATIVE 05/05/2021 2236   LEUKOCYTESUR NEGATIVE 05/05/2021 2236   Sepsis Labs: Invalid input(s): PROCALCITONIN, [place]  Microbiology: Recent Results (from the past 240 hour(s))  Resp Panel by RT-PCR (Flu A&B, Covid) Nasopharyngeal Swab     Status: None   Collection Time: 05/03/21  3:47 AM   Specimen: Nasopharyngeal Swab; Nasopharyngeal(NP) swabs in vial transport medium  Result Value Ref Range Status   SARS Coronavirus 2 by RT PCR NEGATIVE NEGATIVE Final    Comment: (NOTE) SARS-CoV-2 target nucleic acids are NOT DETECTED.  The SARS-CoV-2 RNA is generally detectable in upper respiratory specimens during the acute phase of infection. The lowest concentration of SARS-CoV-2 viral copies this assay can detect is 138 copies/mL. A negative result does not preclude SARS-Cov-2 infection and should not be used as the sole basis for treatment or other  patient management decisions. A negative result may occur with  improper specimen collection/handling, submission of specimen other than nasopharyngeal swab, presence of viral  mutation(s) within the areas targeted by this assay, and inadequate number of viral copies(<138 copies/mL). A negative result must be combined with clinical observations, patient history, and epidemiological information. The expected result is Negative.  Fact Sheet for Patients:  EntrepreneurPulse.com.au  Fact Sheet for Healthcare Providers:  IncredibleEmployment.be  This test is no t yet approved or cleared by the [place] FDA and  has been authorized for detection and/or diagnosis of SARS-CoV-2 by FDA under an Emergency Use Authorization (EUA). This EUA will remain  in effect (meaning this test can be used) for the duration of the COVID-19 declaration under Section 564(b)(1) of the Act, 21 U.S.C.section 360bbb-3(b)(1), unless the authorization is terminated  or revoked sooner.       Influenza A by PCR NEGATIVE NEGATIVE Final   Influenza B by PCR NEGATIVE NEGATIVE Final    Comment: (NOTE) The Xpert Xpress SARS-CoV-2/FLU/RSV plus assay is intended as an aid in the diagnosis of influenza from Nasopharyngeal swab specimens and should not be used as a sole basis for treatment. Nasal washings and aspirates are unacceptable for Xpert Xpress SARS-CoV-2/FLU/RSV testing.  Fact Sheet for Patients: EntrepreneurPulse.com.au  Fact Sheet for Healthcare Providers: IncredibleEmployment.be  This test is not yet approved or cleared by the [place] FDA and has been authorized for detection and/or diagnosis of SARS-CoV-2 by FDA under an Emergency Use Authorization (EUA). This EUA will remain in effect (meaning this test can be used) for the duration of the COVID-19 declaration under Section 564(b)(1) of the Act, 21 U.S.C. section  360bbb-3(b)(1), unless the authorization is terminated or revoked.  Performed at [facility], [place] [address], [place], [place] [address]   Blood culture (routine x 2)     Status: None   Collection Time: 05/03/21  4:15 AM   Specimen: BLOOD  Result Value Ref Range Status   Specimen Description BLOOD SITE NOT SPECIFIED  Final   Special Requests   Final    BOTTLES DRAWN AEROBIC AND ANAEROBIC Blood Culture results may not be optimal due to an inadequate volume of blood received in culture bottles   Culture   Final    NO GROWTH 5 DAYS Performed at [facility], [place] [address], [place], [place] [address]    Report Status 05/08/2021 FINAL  Final  Blood culture (routine x 2)     Status: None   Collection Time: 05/03/21  4:17 AM   Specimen: BLOOD  Result Value Ref Range Status   Specimen Description BLOOD SITE NOT SPECIFIED  Final   Special Requests   Final    BOTTLES DRAWN AEROBIC AND ANAEROBIC Blood Culture results may not be optimal due to an inadequate volume of blood received in culture bottles   Culture   Final    NO GROWTH 5 DAYS Performed at [facility], [place] [address], [place], [place] [address]    Report Status 05/08/2021 FINAL  Final  Resp Panel by RT-PCR (Flu A&B, Covid) Nasopharyngeal Swab     Status: None   Collection Time: 05/05/21  8:24 PM   Specimen: Nasopharyngeal Swab; Nasopharyngeal(NP) swabs in vial transport medium  Result Value Ref Range Status   SARS Coronavirus 2 by RT PCR NEGATIVE NEGATIVE Final    Comment: (NOTE) SARS-CoV-2 target nucleic acids are NOT DETECTED.  The SARS-CoV-2 RNA is generally detectable in upper respiratory specimens during the acute phase of infection. The lowest concentration of SARS-CoV-2 viral copies this assay can detect is 138 copies/mL. A negative  result does not preclude SARS-Cov-2 infection and should not be used as the sole basis for treatment or other patient management decisions. A negative result may occur  with  improper specimen collection/handling, submission of specimen other than nasopharyngeal swab, presence of viral mutation(s) within the areas targeted by this assay, and inadequate number of viral copies(<138 copies/mL). A negative result must be combined with clinical observations, patient history, and epidemiological information. The expected result is Negative.  Fact Sheet for Patients:  EntrepreneurPulse.com.au  Fact Sheet for Healthcare Providers:  IncredibleEmployment.be  This test is no t yet approved or cleared by the [place] FDA and  has been authorized for detection and/or diagnosis of SARS-CoV-2 by FDA under an Emergency Use Authorization (EUA). This EUA will remain  in effect (meaning this test can be used) for the duration of the COVID-19 declaration under Section 564(b)(1) of the Act, 21 U.S.C.section 360bbb-3(b)(1), unless the authorization is terminated  or revoked sooner.       Influenza A by PCR NEGATIVE NEGATIVE Final   Influenza B by PCR NEGATIVE NEGATIVE Final    Comment: (NOTE) The Xpert Xpress SARS-CoV-2/FLU/RSV plus assay is intended as an aid in the diagnosis of influenza from Nasopharyngeal swab specimens and should not be used as a sole basis for treatment. Nasal washings and aspirates are unacceptable for Xpert Xpress SARS-CoV-2/FLU/RSV testing.  Fact Sheet for Patients: EntrepreneurPulse.com.au  Fact Sheet for Healthcare Providers: IncredibleEmployment.be  This test is not yet approved or cleared by the [place] FDA and has been authorized for detection and/or diagnosis of SARS-CoV-2 by FDA under an Emergency Use Authorization (EUA). This EUA will remain in effect (meaning this test can be used) for the duration of the COVID-19 declaration under Section 564(b)(1) of the Act, 21 U.S.C. section 360bbb-3(b)(1), unless the authorization is terminated  or revoked.  Performed at [facility], [place] [address], [place], [place] [address]   Culture, blood (routine x 2)     Status: None   Collection Time: 05/05/21  8:40 PM   Specimen: BLOOD  Result Value Ref Range Status   Specimen Description BLOOD RIGHT ANTECUBITAL  Final   Special Requests   Final    BOTTLES DRAWN AEROBIC AND ANAEROBIC Blood Culture adequate volume   Culture   Final    NO GROWTH 5 DAYS Performed at [facility], [place] [address] [place], [place] [address]    Report Status 05/11/2021 FINAL  Final  Culture, blood (routine x 2)     Status: None   Collection Time: 05/05/21  8:45 PM   Specimen: BLOOD RIGHT HAND  Result Value Ref Range Status   Specimen Description BLOOD RIGHT HAND  Final   Special Requests   Final    BOTTLES DRAWN AEROBIC AND ANAEROBIC Blood Culture adequate volume   Culture   Final    NO GROWTH 5 DAYS Performed at [facility], [place] [address] [place], [place] [address]    Report Status 05/11/2021 FINAL  Final  Urine culture     Status: None   Collection Time: 05/05/21 10:36 PM   Specimen: Urine, Random  Result Value Ref Range Status   Specimen Description URINE, RANDOM  Final   Special Requests NONE  Final   Culture   Final    NO GROWTH Performed at [facility], [place] [address], [place], [place] [address]    Report Status 05/07/2021 FINAL  Final  MRSA PCR Screening  Status: None   Collection Time: 05/06/21  8:01 PM   Specimen: Nasopharyngeal  Result Value Ref Range Status   MRSA by PCR NEGATIVE NEGATIVE Final    Comment:        The GeneXpert MRSA Assay (FDA approved for NASAL specimens only), is one component of a comprehensive MRSA colonization surveillance program. It is not intended to diagnose MRSA infection nor to guide or monitor treatment for MRSA infections. Performed at [facility], [place] [address] [place], [place] [address]   Culture, blood (Routine X 2) w Reflex to ID Panel     Status:  None (Preliminary result)   Collection Time: 05/09/21  3:26 PM   Specimen: BLOOD  Result Value Ref Range Status   Specimen Description BLOOD LEFT ANTECUBITAL  Final   Special Requests   Final    BOTTLES DRAWN AEROBIC AND ANAEROBIC Blood Culture adequate volume   Culture   Final    NO GROWTH 3 DAYS Performed at [facility], [place] [address], [place], [place] [address]    Report Status PENDING  Incomplete  Culture, blood (Routine X 2) w Reflex to ID Panel     Status: None (Preliminary result)   Collection Time: 05/09/21  3:26 PM   Specimen: BLOOD  Result Value Ref Range Status   Specimen Description BLOOD LEFT ANTECUBITAL  Final   Special Requests   Final    BOTTLES DRAWN AEROBIC AND ANAEROBIC Blood Culture adequate volume   Culture   Final    NO GROWTH 3 DAYS Performed at [facility], [place] [address], [place], [place] [address]    Report Status PENDING  Incomplete  Resp Panel by RT-PCR (Flu A&B, Covid) Nasopharyngeal Swab     Status: None   Collection Time: 05/11/21 11:54 AM   Specimen: Nasopharyngeal Swab; Nasopharyngeal(NP) swabs in vial transport medium  Result Value Ref Range Status   SARS Coronavirus 2 by RT PCR NEGATIVE NEGATIVE Final    Comment: (NOTE) SARS-CoV-2 target nucleic acids are NOT DETECTED.  The SARS-CoV-2 RNA is generally detectable in upper respiratory specimens during the acute phase of infection. The lowest concentration of SARS-CoV-2 viral copies this assay can detect is 138 copies/mL. A negative result does not preclude SARS-Cov-2 infection and should not be used as the sole basis for treatment or other patient management decisions. A negative result may occur with  improper specimen collection/handling, submission of specimen other than nasopharyngeal swab, presence of viral mutation(s) within the areas targeted by this assay, and inadequate number of viral copies(<138 copies/mL). A negative result must be combined with clinical  observations, patient history, and epidemiological information. The expected result is Negative.  Fact Sheet for Patients:  EntrepreneurPulse.com.au  Fact Sheet for Healthcare Providers:  IncredibleEmployment.be  This test is no t yet approved or cleared by the [place] FDA and  has been authorized for detection and/or diagnosis of SARS-CoV-2 by FDA under an Emergency Use Authorization (EUA). This EUA will remain  in effect (meaning this test can be used) for the duration of the COVID-19 declaration under Section 564(b)(1) of the Act, 21 U.S.C.section 360bbb-3(b)(1), unless the authorization is terminated  or revoked sooner.       Influenza A by PCR NEGATIVE NEGATIVE Final   Influenza B by PCR NEGATIVE NEGATIVE Final    Comment: (NOTE) The Xpert Xpress SARS-CoV-2/FLU/RSV plus assay is intended as an aid in the diagnosis of influenza from Nasopharyngeal swab specimens and should not be  used as a sole basis for treatment. Nasal washings and aspirates are unacceptable for Xpert Xpress SARS-CoV-2/FLU/RSV testing.  Fact Sheet for Patients: EntrepreneurPulse.com.au  Fact Sheet for Healthcare Providers: IncredibleEmployment.be  This test is not yet approved or cleared by the [place] FDA and has been authorized for detection and/or diagnosis of SARS-CoV-2 by FDA under an Emergency Use Authorization (EUA). This EUA will remain in effect (meaning this test can be used) for the duration of the COVID-19 declaration under Section 564(b)(1) of the Act, 21 U.S.C. section 360bbb-3(b)(1), unless the authorization is terminated or revoked.  Performed at [facility], [place] [address] [place], [place] [address]     Radiology Studies: VAS [place] ABI WITH/WO TBI  Result Date: 05/11/2021  LOWER EXTREMITY DOPPLER STUDY Patient Name:  [name]  Date of Exam:   05/11/2021 Medical Rec #: [id number]           Accession #:    [id number] Date of Birth: 26-Apr-1961           Patient Gender: M Patient Age:   60Y Exam Location:  [facility] Procedure:      VAS [place] ABI WITH/WO TBI Referring Phys: [id number] [facility] M PATEL --------------------------------------------------------------------------------  Indications: Bilateral foot pain. High Risk Factors: Hypertension, hyperlipidemia, Diabetes, no history of                    smoking. Other Factors: CKD3, CHF.  Comparison Study: No previous exams Performing Technologist: Hill, Jody RVT, RDMS  Examination Guidelines: A complete evaluation includes at minimum, Doppler waveform signals and systolic blood pressure reading at the level of bilateral brachial, anterior tibial, and posterior tibial arteries, when vessel segments are accessible. Bilateral testing is considered an integral part of a complete examination. Photoelectric Plethysmograph (PPG) waveforms and toe systolic pressure readings are included as required and additional duplex testing as needed. Limited examinations for reoccurring indications may be performed as noted.  ABI Findings: +---------+------------------+-----+-------------------+--------+ Right    Rt Pressure (mmHg)IndexWaveform           Comment  +---------+------------------+-----+-------------------+--------+ Brachial 106                    triphasic                   +---------+------------------+-----+-------------------+--------+ PTA      71                0.67 dampened monophasic         +---------+------------------+-----+-------------------+--------+ DP       52                0.49 dampened monophasic         +---------+------------------+-----+-------------------+--------+ Great Toe0                 0.00 Absent                      +---------+------------------+-----+-------------------+--------+ +---------+------------------+-----+----------+-----------------+ Left     Lt Pressure (mmHg)IndexWaveform   Comment           +---------+------------------+-----+----------+-----------------+ Brachial 102                    triphasic                   +---------+------------------+-----+----------+-----------------+ PTA      91                0.86 biphasic  auditory biphasic +---------+------------------+-----+----------+-----------------+ DP       82                0.77 monophasic                  +---------+------------------+-----+----------+-----------------+ Great Toe49                0.46 Abnormal                    +---------+------------------+-----+----------+-----------------+ +-------+-----------+-----------+------------+------------+ ABI/TBIToday's ABIToday's TBIPrevious ABIPrevious TBI +-------+-----------+-----------+------------+------------+ Right  0.67                                           +-------+-----------+-----------+------------+------------+ Left   0.86                                           +-------+-----------+-----------+------------+------------+   Summary: Right: Resting right ankle-brachial index indicates moderate right lower extremity arterial disease. The right toe-brachial index is unobtainable/absent. Left: Resting left ankle-brachial index indicates mild left lower extremity arterial disease. The left toe-brachial index is abnormal.  *See table(s) above for measurements and observations.  Electronically signed by Harold Barban MD on 05/11/2021 at 9:21:16 PM.    Final       Benay Pomeroy T. Wilton  If 7PM-7AM, please contact night-coverage [email or web address] 05/12/2021, 1:57 PM

## 2021-05-12 NOTE — Progress Notes (Signed)
Occupational Therapy Treatment Patient Details Name: [name] MRN: [id number] DOB: 1961-05-05 Today's Date: 05/12/2021    History of present illness Pt is a 60 y/o male admitted 5/19 secondary to BLE pain and swelling. Workup pending. PMH includes HTN, s/p AVR, CHF, and DM.   OT comments  Pt making great progress this session with pt able to sit<>stand with supervision and able to complete functional mobility greater than a household distance with RW ( ~ 150 ft) and min guard assist. Pt currently requires set- up for UB ADLS and LB ADLs. Pt continues to want to go to rehab although based on todays progress feel pt is still appropriate for [facility] pending support at home. Pt would continue to benefit from skilled occupational therapy while admitted and after d/c to address the below listed limitations in order to improve overall functional mobility and facilitate independence with BADL participation. DC plan remains appropriate, will follow acutely per POC.    Follow Up Recommendations  Home health OT    Equipment Recommendations  3 in 1 bedside commode    Recommendations for Other Services      Precautions / Restrictions Precautions Precautions: Fall Restrictions Weight Bearing Restrictions: No       Mobility Bed Mobility               General bed mobility comments: sitting EOB upon arrival, returned to chair at end of session    Transfers Overall transfer level: Needs assistance Equipment used: Rolling walker (2 wheeled) Transfers: Sit to/from Stand Sit to Stand: Supervision         General transfer comment: pt impulsively powered up from EOB with no physical assist and immediatley began walking    Balance   Sitting-balance support: No upper extremity supported;Feet supported Sitting balance-Leahy Scale: Good Sitting balance - Comments: sitting EOB with no UE support upon arrival   Standing balance support: Bilateral upper extremity supported;No upper  extremity supported Standing balance-Leahy Scale: Fair Standing balance comment: can static stand briefly to don mask                           ADL either performed or assessed with clinical judgement   ADL                   Upper Body Dressing : Sitting;Set up Upper Body Dressing Details (indicate cue type and reason): to don back side cover Lower Body Dressing: Supervision/safety;Set up Lower Body Dressing Details (indicate cue type and reason): to don slide on shoes Toilet Transfer: Min Marine scientist Details (indicate cue type and reason): simulated via functional mobility         Functional mobility during ADLs: Min guard;Rolling walker General ADL Comments: pt with great progress this session able to ambulate greater than a household distance, pt continues to c/o pain but appears to be manageable     Vision       Perception     Praxis      Cognition Arousal/Alertness: Awake/alert Behavior During Therapy: WFL for tasks assessed/performed Overall Cognitive Status: No family/caregiver present to determine baseline cognitive functioning                                 General Comments: can be tangenital at times, difficult to redirect        Exercises     Shoulder Instructions  General Comments on RA with SpO2 100% HR 71 bpm during ambulation    Pertinent Vitals/ Pain       Pain Assessment: 0-10 Pain Score: 6  (up to a 7 with walking) Pain Location: bilateral feet Pain Descriptors / Indicators: Aching;Pins and needles Pain Intervention(s): Monitored during session  Home Living                                          Prior Functioning/Environment              Frequency  Min 2X/week        Progress Toward Goals  OT Goals(current goals can now be found in the care plan section)  Progress towards OT goals: Progressing toward goals  Acute Rehab OT Goals Patient  Stated Goal: to go to rehab OT Goal Formulation: With patient Time For Goal Achievement: 05/21/21 Potential to Achieve Goals: [place] Discharge plan remains appropriate;Frequency remains appropriate    Co-evaluation                 AM-PAC OT "6 Clicks" Daily Activity     Outcome Measure   Help from another person eating meals?: None Help from another person taking care of personal grooming?: A Little Help from another person toileting, which includes using toliet, bedpan, or urinal?: A Little Help from another person bathing (including washing, rinsing, drying)?: A Little Help from another person to put on and taking off regular upper body clothing?: None Help from another person to put on and taking off regular lower body clothing?: A Little 6 Click Score: 20    End of Session Equipment Utilized During Treatment: Rolling walker;Other (comment) (pt would not allow OTA to don gait belt)  OT Visit Diagnosis: Muscle weakness (generalized) (M62.81);Pain Pain - Right/Left:  (bilateral) Pain - part of body:  (feet)   Activity Tolerance     Patient Left in chair;with call bell/phone within reach   Nurse Communication Mobility status;Other (comment) (please order SCDs)        Time: 4481-8563 OT Time Calculation (min): 18 min  Charges: OT General Charges $OT Visit: 1 Visit OT Treatments $Self Care/Home Management : 8-22 mins  Harley Alto., COTA/L Acute Rehabilitation Services [phone]    Precious Haws 05/12/2021, 8:26 AM

## 2021-05-12 NOTE — Progress Notes (Signed)
Physical Therapy Treatment Patient Details Name: [name] MRN: [id number] DOB: Mar 27, 1961 Today's Date: 05/12/2021    History of Present Illness Pt is a 60 y/o male admitted 5/19 secondary to BLE pain and swelling. workout unremarkable except Uric acid was elevated PMH includes HTN, s/p AVR, CHF, and DM.    PT Comments    Pt agreeable to exercises and OOB to be weighed. Pt states he already walked with OT today. Pt requiring increased redirection to attend to tasks. Pt able to perform exercises wtihout assist. Discussed progress with pt and mentioned d/c home, pt states he is going to a facility for rehab to get stronger. Pt performed bed mobility mod I and S due to safety for transfers. Pt will benefit from skilled PT to address deficits in balance, strength, endurance ands afety to maximize independence with functional mobility prior to discharge.     Follow Up Recommendations  SNF     Equipment Recommendations  Rolling walker with 5" wheels;3in1 (PT);Wheelchair (measurements PT);Wheelchair cushion (measurements PT)    Recommendations for Other Services       Precautions / Restrictions Precautions Precautions: Fall Restrictions Weight Bearing Restrictions: No    Mobility  Bed Mobility Overal bed mobility: Modified Independent             General bed mobility comments: sitting EOB upon arrival, returned to chair at end of session    Transfers Overall transfer level: Needs assistance Equipment used: Rolling walker (2 wheeled) Transfers: Sit to/from Stand Sit to Stand: Supervision         General transfer comment: performed 3 trials of standing with RW wtih S from therapist due to poor safety awareness and impulsivity  Ambulation/Gait                 Stairs             Wheelchair Mobility    Modified Rankin (Stroke Patients Only)       Balance Overall balance assessment: Mild deficits observed, not formally tested Sitting-balance  support: No upper extremity supported;Feet supported Sitting balance-Leahy Scale: Good Sitting balance - Comments: sitting EOB with no UE support upon arrival   Standing balance support: Bilateral upper extremity supported;No upper extremity supported Standing balance-Leahy Scale: Fair Standing balance comment: can static stand briefly to don mask                            Cognition Arousal/Alertness: Awake/alert Behavior During Therapy: WFL for tasks assessed/performed Overall Cognitive Status: No family/caregiver present to determine baseline cognitive functioning                                 General Comments: can be tangenital at times, difficult to redirect      Exercises General Exercises - Lower Extremity Ankle Circles/Pumps: AROM;Both;10 reps;Seated Long Arc Quad: AROM;Both;10 reps;Seated Heel Slides: AROM;Both;10 reps;Supine Hip ABduction/ADduction: AROM;Both;10 reps;Supine Straight Leg Raises: AROM;Both;10 reps;Supine Hip Flexion/Marching: AROM;Both;10 reps;Seated    General Comments General comments (skin integrity, edema, etc.): VSS. Discussed d/c home with improved moblity, pt states he is going to a facility to get stronger      Pertinent Vitals/Pain Pain Assessment: 0-10 Pain Score: 5  Pain Location: bilateral feet Pain Descriptors / Indicators: Aching;Pins and needles Pain Intervention(s): Monitored during session    Home Living  Prior Function            PT Goals (current goals can now be found in the care plan section) Acute Rehab PT Goals Patient Stated Goal: to go to rehab PT Goal Formulation: With patient Time For Goal Achievement: 05/20/21 Potential to Achieve Goals: Good Progress towards PT goals: Progressing toward goals    Frequency    Min 2X/week      PT Plan Frequency needs to be updated    Campbell-evaluation              AM-PAC PT "6 Clicks" Mobility   Outcome  Measure  Help needed turning from your back to your side while in a flat bed without using bedrails?: None Help needed moving from lying on your back to sitting on the side of a flat bed without using bedrails?: None Help needed moving to and from a bed to a chair (including a wheelchair)?: A Little Help needed standing up from a chair using your arms (e.g., wheelchair or bedside chair)?: A Little Help needed to walk in hospital room?: A Little Help needed climbing 3-5 steps with a railing? : A Little 6 Click Score: 20    End of Session Equipment Utilized During Treatment: Gait belt Activity Tolerance: Patient limited by pain;Other (comment) (pt states he already walked with OT and will walk again after lunch) Patient left: in bed;with call bell/phone within reach Nurse Communication: Mobility status PT Visit Diagnosis: Pain;Difficulty in walking, not elsewhere classified (R26.2) Pain - part of body: Ankle and joints of foot     Time: 1010-1038 PT Time Calculation (min) (ACUTE ONLY): 28 min  Charges:  $Therapeutic Exercise: 8-22 mins $Therapeutic Activity: 8-22 mins                     [name], DPT Acute Rehabilitation Services [phone]   [name] 05/12/2021, 11:41 AM

## 2021-05-13 ENCOUNTER — Encounter (HOSPITAL_COMMUNITY)
Admission: EM | Disposition: A | Discharge status: Skilled Nursing Facility | Payer: Self-pay | Source: Home / Self Care | Attending: Student

## 2021-05-13 DIAGNOSIS — Z953 Presence of xenogenic heart valve: Secondary | ICD-10-CM

## 2021-05-13 DIAGNOSIS — I10 Essential (primary) hypertension: Secondary | ICD-10-CM | POA: Diagnosis not present

## 2021-05-13 DIAGNOSIS — R509 Fever, unspecified: Secondary | ICD-10-CM | POA: Diagnosis not present

## 2021-05-13 DIAGNOSIS — N182 Chronic kidney disease, stage 2 (mild): Secondary | ICD-10-CM | POA: Diagnosis not present

## 2021-05-13 DIAGNOSIS — I513 Intracardiac thrombosis, not elsewhere classified: Secondary | ICD-10-CM

## 2021-05-13 DIAGNOSIS — Z8679 Personal history of other diseases of the circulatory system: Secondary | ICD-10-CM

## 2021-05-13 DIAGNOSIS — I420 Dilated cardiomyopathy: Secondary | ICD-10-CM | POA: Diagnosis not present

## 2021-05-13 LAB — CBC
HCT: 43.3 % (ref 39.0–52.0)
Hemoglobin: 13.9 g/dL (ref 13.0–17.0)
MCH: 27.7 pg (ref 26.0–34.0)
MCHC: 32.1 g/dL (ref 30.0–36.0)
MCV: 86.3 fL (ref 80.0–100.0)
Platelets: 425 10*3/uL — ABNORMAL HIGH (ref 150–400)
RBC: 5.02 MIL/uL (ref 4.22–5.81)
RDW: 14.3 % (ref 11.5–15.5)
WBC: 13.2 10*3/uL — ABNORMAL HIGH (ref 4.0–10.5)
nRBC: 0 % (ref 0.0–0.2)

## 2021-05-13 LAB — RENAL FUNCTION PANEL
Albumin: 3.1 g/dL — ABNORMAL LOW (ref 3.5–5.0)
Anion gap: 8 (ref 5–15)
BUN: 34 mg/dL — ABNORMAL HIGH (ref 6–20)
CO2: 24 mmol/L (ref 22–32)
Calcium: 9.5 mg/dL (ref 8.9–10.3)
Chloride: 102 mmol/L (ref 98–111)
Creatinine, Ser: 1.51 mg/dL — ABNORMAL HIGH (ref 0.61–1.24)
GFR, Estimated: 53 mL/min — ABNORMAL LOW (ref >60)
Glucose, Bld: 99 mg/dL (ref 70–99)
Phosphorus: 4.9 mg/dL — ABNORMAL HIGH (ref 2.5–4.6)
Potassium: 4.8 mmol/L (ref 3.5–5.1)
Sodium: 134 mmol/L — ABNORMAL LOW (ref 135–145)

## 2021-05-13 LAB — GLUCOSE, CAPILLARY
Glucose-Capillary: 101 mg/dL — ABNORMAL HIGH (ref 70–99)
Glucose-Capillary: 126 mg/dL — ABNORMAL HIGH (ref 70–99)
Glucose-Capillary: 129 mg/dL — ABNORMAL HIGH (ref 70–99)
Glucose-Capillary: 194 mg/dL — ABNORMAL HIGH (ref 70–99)

## 2021-05-13 LAB — PROTIME-INR
INR: 2 — ABNORMAL HIGH (ref 0.8–1.2)
Prothrombin Time: 22.4 seconds — ABNORMAL HIGH (ref 11.4–15.2)

## 2021-05-13 LAB — MAGNESIUM: Magnesium: 2.4 mg/dL (ref 1.7–2.4)

## 2021-05-13 SURGERY — ECHOCARDIOGRAM, TRANSESOPHAGEAL
Anesthesia: Monitor Anesthesia Care

## 2021-05-13 MED ORDER — ALLOPURINOL 100 MG PO TABS
100.0000 mg | ORAL_TABLET | Freq: Every day | ORAL | Status: DC
  Administered 2021-05-13 – 2021-05-14 (×2): 100 mg via ORAL
  Filled 2021-05-13 (×2): qty 1

## 2021-05-13 NOTE — Clinical Social Work Note (Addendum)
Patient's discharge to [place] pending receipt of insurance authorization from [place]. CSW will continue to follow and provide intervention services as needed through discharge.   Caryn Gienger Givens, MSW, LCSW Licensed Clinical Social Worker [place] [phone]

## 2021-05-13 NOTE — Plan of Care (Signed)
  Problem: Activity: Goal: Risk for activity intolerance will decrease Outcome: Progressing   

## 2021-05-13 NOTE — Research (Signed)
           Day 0 Screening Day - Subject [id number] January 24th, 2022    Composite Congestion Score (CCS) Indicate the subject's current CCS Score at the present time (select for each signs/symptoms) Was CCS Performed:     [x] Yes   [] No (Complete Protocol Deviation)     Signs/Symptoms 0 - None  1 -Seldom 2 -Frequent 3 -Continuous  Dyspnoea []  []  [x]  []   Orthopnoea []  []  [x]  []   Fatigue []  []  [x]  []    Signs/Symptoms 0 - <=6  1 - 6-9 2 - 10-15 3 - >=15  JVD (cm H2O) []  [x]  []  []    Signs/Symptoms 0 -None  1 -Bases 2 -To <50% 3 -To >50%  Rales [x]  []  []  []    Signs/Symptoms 0 -Absent/ trace  1 -Slight 2 -Moderate 3 -Marked  Oedema []  []  [x]  []

## 2021-05-13 NOTE — Progress Notes (Signed)
ANTICOAGULATION CONSULT NOTE - Follow Up Consult  Pharmacy Consult for Coumadin Indication: hx aflutter, LV thrombus (from May 2020), bioprosthetic AVR  No Known Allergies  Patient Measurements: Height: 5\' 11"  (180.3 cm) Weight: 120 kg (264 lb 8.8 oz) IBW/kg (Calculated) : 75.3  Vital Signs: Temp: 98.5 F (36.9 C) (05/27 0903) Temp Source: Oral (05/27 0903) BP: 109/74 (05/27 0903) Pulse Rate: 54 (05/27 0903)  Labs: Recent Labs    05/11/21 0405 05/12/21 0351 05/13/21 0613  HGB 13.6  --  13.9  HCT 42.4  --  43.3  PLT 414*  --  425*  LABPROT 24.2*  --  22.4*  INR 2.2*  --  2.0*  CREATININE 1.52* 1.65* 1.51*    Estimated Creatinine Clearance: 68.6 mL/min (A) (by C-G formula based on SCr of 1.51 mg/dL (H)).   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 Dopplers negative for new DVT.   -PTA dose Coumadin 10 mg MWF, 7.5 mg TTSS with admit INR 2.5  INR continues to be therapeutic 2.0 however has been trending down over past 3 days (2.3 > 2.2 > 2.0).  Goal of Therapy:  INR 2-3 Monitor platelets by anticoagulation protocol: Yes   Plan:  -Continue home Coumadin 7.5mg  qday except 10mg  MWF, however if INR trends down further tomorrow, could consider instead of 7.5mg  on Saturday to give another 10mg . Noticed in chart that patient did miss a 7.5mg  dose on 5/21 (past Saturday) so so lower total mg of Coumadin this past week.   -INR level's remain MWF however will get a x1 INR draw tomorrow to assess the trend. If another INR needed for Sunday, will need to be ordered separately.   Joetta Manners, PharmD, [facility] Clinical Pharmacist  Please check AMION for all [place] phone numbers After 10:00 PM, call [place] [phone]

## 2021-05-13 NOTE — Telephone Encounter (Signed)
At Home-HF  Telephone Script Day: 5     Date of Phone Call: January 29th, 2022                                   Time: 10:00am                                                    Subject ID: [id number]        Phone Call Performed:  [x]  Yes  []  No  If No,   []  Subject did not respond   []  Subject unavailable   []  Other, specify ___________     Weight and Blood pressure:  1. Did you use the provided scale to weigh yourself this morning? [x]  Yes  []  No   If Yes, Did you document it in the diary? [x]  Yes []  No  What was your weight today? 259 lbs.  If No, please ask to the subject to weigh himself/herself now and obtain the measurement. _____ lbs.     2. Did you use the provided "cuff" to take your Blood Pressure and Heart Rate this morning? [x]  Yes  []  No  If yes, did you document it in the Diary?  [x]  Yes  []  No    What was your Blood Pressure and Heart Rate today?   Heart Rate: 68 bpm           Blood Pressure: 145/85 mmHg         Patient Global Assessment Visual Analog Scale (VAS): Please ask if subject completed the Visual Analog Scale in the Diary.  [x]  Yes []  No If No, Ask to complete now and remind the subject to complete it before next scheduled call or visit.       5-Point Likert Scale: Current Dyspnea Status How much difficulty are you having in breathing now?  []  not short of breath               [x]  mildly short of breath             []  moderately short of breath             []  severely short of breath                  []  very severely short of breath     7-Point Likert Scale: Patient-Assessed Dyspnea Status Compared to how much difficulty you were having with your breathing just before starting in the study, how is your breathing now?  []  Markedly better       [x]  Moderately better       []  Minimally better       []  No change       []  Minimally worse       []  Moderately worse       []  Markedly worse       Medications/Changes and  Medical Events:  1. How much oral diuretic did you take yesterday? ___________    2. Did you document in the Diary start time and end time for Furoscix Infusor (if applicable)? []  Yes []  No []  N/A   3. Have you had any problems/issues with Infusor? []  Yes []  No [x]  N/A If yes, please describe: ______________________________________________  4. Since your last visit/call, have you started any new medications or have your medications changed? (review compare to last visit/call)  []  Yes [x]  No If yes, please specify:________________________________________________   5. Since your last visit/call, have you had any medical events, have you visited a hospital, Emergency Department or seen any other health care provider?  []  Yes [x]  No If yes, please describe (including treatments):_____________________________

## 2021-05-13 NOTE — Progress Notes (Signed)
PROGRESS NOTE  [name] NWG:[id number] DOB: [birth year]   PCP: Flossie Buffy, NP  Patient is from: Home  DOA: 05/05/2021 LOS: 4  Chief complaints: Bilateral feet pain, swelling and productive cough  Brief Narrative / Interim history: 60 year old M with PMH of DM-2, LV thrombus/A-flutter on Coumadin, bioprosthetic AVR, HTN, systolic CHF, VHQ-4O and obesity presenting with bilateral feet pain and swelling as well as productive cough, and admitted for fever of unknown origin, bilateral feet pain and acute on chronic systolic CHF.  Patient was febrile to 101.5 on presentation.  Infectious work-up including cultures, bilateral foot and ankle x-ray and MRI, TTE, and CT chest/abdomen/pelvis unrevealing.  Infectious disease consulted.  Patient received azithromycin for 3 days.  Uric acid was elevated, and he was a started on colchicine and prednisone.  ABI also consistent with bilateral PAD.  Started on atorvastatin and aspirin.  He needs outpatient follow-up with vascular surgery.  Therapy recommended SNF.  Waiting on insurance authorization.  Subjective: Seen and examined earlier this morning.  No major events overnight of this morning.  He reports productive cough with whitish phlegm when he tries to lie on his left side.  Denies chest pain or shortness of breath.  Denies GI or UTI symptoms.  Feet pain improved.  Objective: Vitals:   05/12/21 1710 05/12/21 2047 05/13/21 0454 05/13/21 0903  BP: (!) 112/55 102/69 107/77 109/74  Pulse: (!) 57 64 63 (!) 54  Resp: 18 20 16 20   Temp: 98.3 F (36.8 C) 98.3 F (36.8 C) 98.1 F (36.7 C) 98.5 F (36.9 C)  TempSrc: Oral Oral Oral Oral  SpO2: 99% 95% 96% 95%  Weight:      Height:        Intake/Output Summary (Last 24 hours) at 05/13/2021 1415 Last data filed at 05/13/2021 0901 Gross per 24 hour  Intake 360 ml  Output 1100 ml  Net -740 ml   Filed Weights   05/06/21 0137 05/06/21 1610  Weight: 115.7 kg 120 kg     Examination:  GENERAL: No apparent distress.  Nontoxic. HEENT: MMM.  Vision and hearing grossly intact.  NECK: Supple.  No apparent JVD.  RESP: On RA.  No IWOB.  Fair aeration bilaterally. CVS:  RRR.  2/6 SEM over RUSB and LUSB. ABD/GI/GU: BS+. Abd soft, NTND.  MSK/EXT:  Moves extremities. No apparent deformity. No edema.  SKIN: no apparent skin lesion or wound NEURO: Awake and alert. Oriented appropriately.  No apparent focal neuro deficit. PSYCH: Calm. Normal affect.   Procedures:  None  Microbiology summarized: [id number] and influenza PCR nonreactive MRSA PCR nonreactive Urine culture negative Blood cultures negative on 5/19 and 5/23  Assessment & Plan: Bilateral feet pain/bilateral PAD-likely due to gout and neuropathy.  ABI with bilateral PAD.  Uric acid elevated.  Inflammatory markers elevated but improved.  Otherwise, extensive unrevealing work-up as above -P.o. prednisone 5/23-5/26 -Continue p.o. colchicine. -Start p.o. allopurinol -Continue aspirin and atorvastatin for PAD -Outpatient follow-up with vascular surgery for PAD  Right wrist pain: Could be due to gout.  Seems to have resolved. -Treatment as above  Fever of unknown origin: likely due to gout.  Last true fever was on 5/19 Drenching night sweats -Infectious work-up unrevealing. -ID signed off.  Acute on chronic systolic heart failure: TTE with LVEF of 20 to 25%, GH, moderate LVH mildly reduced RVSF, moderate LAE.  Appears euvolemic on exam.  BNP was 1072.  Had 2.0 L UOP/24 hours.  Net -6 L so far.  -  Continue home p.o. Lasix 40 mg daily -Continue decreased dose of Entresto -Continue Imdur, hydralazine and Jardiance -No room for beta-blocker due to bradycardia -Monitor fluid status and renal function  History LV thrombus/A. flutter: INR therapeutic at 2.0.  Goal INR 2.0-3.0. -Continue Coumadin per pharmacy  History of bioprosthetic AVR-stable  Elevated troponin: Peaked at 84.  Likely demand  ischemia -Cardiac meds as above -Continue aspirin and atorvastatin given PAD  Essential hypertension: Normotensive. -Cardiac meds as above  Uncontrolled DM-2 with neuropathy, hyperglycemia and macrovascular complication: W0J 8.1%.  On Jardiance at home.  Hyperglycemia likely due to steroid. No results for input(s): HGBA1C in the last 72 hours. Recent Labs  Lab 05/12/21 0955 05/12/21 1228 05/12/21 2140 05/13/21 0714 05/13/21 1130  GLUCAP 113* 148* 176* 101* 126*  -Continue Jardiance -Continue Tradjenta-added here -Continue SSI-sensitive.  CKD-3A/azotemia: Relatively stable. Recent Labs    01/13/21 1616 01/17/21 1537 02/09/21 1448 05/02/21 2033 05/05/21 1600 05/06/21 1321 05/10/21 0359 05/11/21 0405 05/12/21 0351 05/13/21 0613  BUN 21* 22* 21* 19 23* 28* 26* 32* 35* 34*  CREATININE 1.84* 1.74* 1.65* 1.70* 1.66* 1.69* 1.40* 1.52* 1.65* 1.51*  -Continue monitoring  Liver cirrhosis: Incidental finding on CT abdomen and pelvis.  Reports alcohol in the past.  Acute hepatitis panel negative. -Encourage weight loss  Morbid obesity Body mass index is 36.9 kg/m.         DVT prophylaxis:   warfarin (COUMADIN) tablet 7.5 mg  warfarin (COUMADIN) tablet 10 mg  Code Status: Full code Family Communication: Patient and/or RN. Available if any question.  Level of care: Med-Surg Status is: Inpatient  Remains inpatient appropriate because:Unsafe d/c plan   Dispo: The patient is from: Home              Anticipated d/c is to: SNF              Patient currently is medically stable to d/c.   Difficult to place patient No       Consultants:  Infectious disease-signed off   Sch Meds:  Scheduled Meds: . allopurinol  100 mg Oral Daily  . aspirin EC  81 mg Oral Daily  . atorvastatin  40 mg Oral q1800  . colchicine  0.6 mg Oral Daily  . empagliflozin  10 mg Oral QAC breakfast  . furosemide  40 mg Oral Daily  . gabapentin  200 mg Oral TID  . hydrALAZINE  10 mg  Oral Q8H  . insulin aspart  0-5 Units Subcutaneous QHS  . insulin aspart  0-9 Units Subcutaneous TID WC  . isosorbide mononitrate  30 mg Oral Daily  . linagliptin  5 mg Oral Daily  . sacubitril-valsartan  1 tablet Oral BID  . warfarin  10 mg Oral Once per day on Mon Wed Fri  . warfarin  7.5 mg Oral Once per day on Sun Tue Thu Sat  . Warfarin - Pharmacist Dosing Inpatient   Does not apply q1600   Continuous Infusions: PRN Meds:.acetaminophen **OR** acetaminophen, guaiFENesin  Antimicrobials: Anti-infectives (From admission, onward)   Start     Dose/Rate Route Frequency Ordered Stop   05/07/21 1400  azithromycin (ZITHROMAX) tablet 500 mg  Status:  Discontinued        500 mg Oral Daily 05/07/21 1300 05/09/21 1043       I have personally reviewed the following labs and images: CBC: Recent Labs  Lab 05/10/21 0359 05/11/21 0405 05/13/21 0613  WBC 12.7* 14.7* 13.2*  HGB 13.8 13.6 13.9  HCT  41.7 42.4 43.3  MCV 84.9 86.2 86.3  PLT 369 414* 425*   BMP &GFR Recent Labs  Lab 05/10/21 0359 05/11/21 0405 05/12/21 0351 05/13/21 0613  NA 134* 134* 136 134*  K 4.4 4.7 4.5 4.8  CL 102 99 101 102  CO2 24 26 30 24   GLUCOSE 143* 141* 139* 99  BUN 26* 32* 35* 34*  CREATININE 1.40* 1.52* 1.65* 1.51*  CALCIUM 9.5 10.0 9.5 9.5  MG  --   --  2.5* 2.4  PHOS  --   --  4.9* 4.9*   Estimated Creatinine Clearance: 68.6 mL/min (A) (by C-G formula based on SCr of 1.51 mg/dL (H)). Liver & Pancreas: Recent Labs  Lab 05/10/21 0359 05/11/21 0405 05/12/21 0351 05/13/21 0613  AST 13* 16  --   --   ALT 18 21  --   --   ALKPHOS 88 89  --   --   BILITOT 1.1 0.6  --   --   PROT 7.0 7.2  --   --   ALBUMIN 3.0* 3.1* 2.9* 3.1*   No results for input(s): LIPASE, AMYLASE in the last 168 hours. No results for input(s): AMMONIA in the last 168 hours. Diabetic: No results for input(s): HGBA1C in the last 72 hours. Recent Labs  Lab 05/12/21 0955 05/12/21 1228 05/12/21 2140 05/13/21 0714  05/13/21 1130  GLUCAP 113* 148* 176* 101* 126*   Cardiac Enzymes: No results for input(s): CKTOTAL, CKMB, CKMBINDEX, TROPONINI in the last 168 hours. No results for input(s): PROBNP in the last 8760 hours. Coagulation Profile: Recent Labs  Lab 05/08/21 0148 05/09/21 0434 05/10/21 0359 05/11/21 0405 05/13/21 0613  INR 2.7* 2.1* 2.3* 2.2* 2.0*   Thyroid Function Tests: No results for input(s): TSH, T4TOTAL, FREET4, T3FREE, THYROIDAB in the last 72 hours. Lipid Profile: Recent Labs    05/12/21 0351  CHOL 174  HDL 28*  LDLCALC 121*  TRIG 126  CHOLHDL 6.2   Anemia Panel: No results for input(s): VITAMINB12, FOLATE, FERRITIN, TIBC, IRON, RETICCTPCT in the last 72 hours. Urine analysis:    Component Value Date/Time   COLORURINE YELLOW 05/05/2021 2236   APPEARANCEUR CLEAR 05/05/2021 2236   LABSPEC 1.017 05/05/2021 2236   PHURINE 5.0 05/05/2021 2236   GLUCOSEU >=500 (A) 05/05/2021 2236   HGBUR NEGATIVE 05/05/2021 2236   BILIRUBINUR NEGATIVE 05/05/2021 2236   KETONESUR NEGATIVE 05/05/2021 2236   PROTEINUR 100 (A) 05/05/2021 2236   UROBILINOGEN 1.0 04/17/2015 1348   NITRITE NEGATIVE 05/05/2021 2236   LEUKOCYTESUR NEGATIVE 05/05/2021 2236   Sepsis Labs: Invalid input(s): PROCALCITONIN, [place]  Microbiology: Recent Results (from the past 240 hour(s))  Resp Panel by RT-PCR (Flu A&B, Covid) Nasopharyngeal Swab     Status: None   Collection Time: 05/05/21  8:24 PM   Specimen: Nasopharyngeal Swab; Nasopharyngeal(NP) swabs in vial transport medium  Result Value Ref Range Status   SARS Coronavirus 2 by RT PCR NEGATIVE NEGATIVE Final    Comment: (NOTE) SARS-CoV-2 target nucleic acids are NOT DETECTED.  The SARS-CoV-2 RNA is generally detectable in upper respiratory specimens during the acute phase of infection. The lowest concentration of SARS-CoV-2 viral copies this assay can detect is 138 copies/mL. A negative result does not preclude SARS-Cov-2 infection and should  not be used as the sole basis for treatment or other patient management decisions. A negative result may occur with  improper specimen collection/handling, submission of specimen other than nasopharyngeal swab, presence of viral mutation(s) within the areas targeted by this  assay, and inadequate number of viral copies(<138 copies/mL). A negative result must be combined with clinical observations, patient history, and epidemiological information. The expected result is Negative.  Fact Sheet for Patients:  EntrepreneurPulse.com.au  Fact Sheet for Healthcare Providers:  IncredibleEmployment.be  This test is no t yet approved or cleared by the [place] FDA and  has been authorized for detection and/or diagnosis of SARS-CoV-2 by FDA under an Emergency Use Authorization (EUA). This EUA will remain  in effect (meaning this test can be used) for the duration of the COVID-19 declaration under Section 564(b)(1) of the Act, 21 U.S.C.section 360bbb-3(b)(1), unless the authorization is terminated  or revoked sooner.       Influenza A by PCR NEGATIVE NEGATIVE Final   Influenza B by PCR NEGATIVE NEGATIVE Final    Comment: (NOTE) The Xpert Xpress SARS-CoV-2/FLU/RSV plus assay is intended as an aid in the diagnosis of influenza from Nasopharyngeal swab specimens and should not be used as a sole basis for treatment. Nasal washings and aspirates are unacceptable for Xpert Xpress SARS-CoV-2/FLU/RSV testing.  Fact Sheet for Patients: EntrepreneurPulse.com.au  Fact Sheet for Healthcare Providers: IncredibleEmployment.be  This test is not yet approved or cleared by the [place] FDA and has been authorized for detection and/or diagnosis of SARS-CoV-2 by FDA under an Emergency Use Authorization (EUA). This EUA will remain in effect (meaning this test can be used) for the duration of the COVID-19 declaration under  Section 564(b)(1) of the Act, 21 U.S.C. section 360bbb-3(b)(1), unless the authorization is terminated or revoked.  Performed at [facility], [place] [address], [place], [place] [address]   Culture, blood (routine x 2)     Status: None   Collection Time: 05/05/21  8:40 PM   Specimen: BLOOD  Result Value Ref Range Status   Specimen Description BLOOD RIGHT ANTECUBITAL  Final   Special Requests   Final    BOTTLES DRAWN AEROBIC AND ANAEROBIC Blood Culture adequate volume   Culture   Final    NO GROWTH 5 DAYS Performed at [facility], [place] [address], [place], [place] [address]    Report Status 05/11/2021 FINAL  Final  Culture, blood (routine x 2)     Status: None   Collection Time: 05/05/21  8:45 PM   Specimen: BLOOD RIGHT HAND  Result Value Ref Range Status   Specimen Description BLOOD RIGHT HAND  Final   Special Requests   Final    BOTTLES DRAWN AEROBIC AND ANAEROBIC Blood Culture adequate volume   Culture   Final    NO GROWTH 5 DAYS Performed at [facility], [place] [address], [place], [place] [address]    Report Status 05/11/2021 FINAL  Final  Urine culture     Status: None   Collection Time: 05/05/21 10:36 PM   Specimen: Urine, Random  Result Value Ref Range Status   Specimen Description URINE, RANDOM  Final   Special Requests NONE  Final   Culture   Final    NO GROWTH Performed at [facility], [place] [address] [place], [place] [address]    Report Status 05/07/2021 FINAL  Final  MRSA PCR Screening     Status: None   Collection Time: 05/06/21  8:01 PM   Specimen: Nasopharyngeal  Result Value Ref Range Status   MRSA by PCR NEGATIVE NEGATIVE Final    Comment:        The GeneXpert MRSA Assay (FDA approved for NASAL specimens only),  is one component of a comprehensive MRSA colonization surveillance program. It is not intended to diagnose MRSA infection nor to guide or monitor treatment for MRSA infections. Performed at [facility],  [place] [address], [place], [place] [address]   Culture, blood (Routine X 2) w Reflex to ID Panel     Status: None (Preliminary result)   Collection Time: 05/09/21  3:26 PM   Specimen: BLOOD  Result Value Ref Range Status   Specimen Description BLOOD LEFT ANTECUBITAL  Final   Special Requests   Final    BOTTLES DRAWN AEROBIC AND ANAEROBIC Blood Culture adequate volume   Culture   Final    NO GROWTH 4 DAYS Performed at [facility], [place] [address], [place], [place] [address]    Report Status PENDING  Incomplete  Culture, blood (Routine X 2) w Reflex to ID Panel     Status: None (Preliminary result)   Collection Time: 05/09/21  3:26 PM   Specimen: BLOOD  Result Value Ref Range Status   Specimen Description BLOOD LEFT ANTECUBITAL  Final   Special Requests   Final    BOTTLES DRAWN AEROBIC AND ANAEROBIC Blood Culture adequate volume   Culture   Final    NO GROWTH 4 DAYS Performed at [facility], [place] [address] [place], [place] [address]    Report Status PENDING  Incomplete  Resp Panel by RT-PCR (Flu A&B, Covid) Nasopharyngeal Swab     Status: None   Collection Time: 05/11/21 11:54 AM   Specimen: Nasopharyngeal Swab; Nasopharyngeal(NP) swabs in vial transport medium  Result Value Ref Range Status   SARS Coronavirus 2 by RT PCR NEGATIVE NEGATIVE Final    Comment: (NOTE) SARS-CoV-2 target nucleic acids are NOT DETECTED.  The SARS-CoV-2 RNA is generally detectable in upper respiratory specimens during the acute phase of infection. The lowest concentration of SARS-CoV-2 viral copies this assay can detect is 138 copies/mL. A negative result does not preclude SARS-Cov-2 infection and should not be used as the sole basis for treatment or other patient management decisions. A negative result may occur with  improper specimen collection/handling, submission of specimen other than nasopharyngeal swab, presence of viral mutation(s) within the areas targeted by this assay, and  inadequate number of viral copies(<138 copies/mL). A negative result must be combined with clinical observations, patient history, and epidemiological information. The expected result is Negative.  Fact Sheet for Patients:  EntrepreneurPulse.com.au  Fact Sheet for Healthcare Providers:  IncredibleEmployment.be  This test is no t yet approved or cleared by the [place] FDA and  has been authorized for detection and/or diagnosis of SARS-CoV-2 by FDA under an Emergency Use Authorization (EUA). This EUA will remain  in effect (meaning this test can be used) for the duration of the COVID-19 declaration under Section 564(b)(1) of the Act, 21 U.S.C.section 360bbb-3(b)(1), unless the authorization is terminated  or revoked sooner.       Influenza A by PCR NEGATIVE NEGATIVE Final   Influenza B by PCR NEGATIVE NEGATIVE Final    Comment: (NOTE) The Xpert Xpress SARS-CoV-2/FLU/RSV plus assay is intended as an aid in the diagnosis of influenza from Nasopharyngeal swab specimens and should not be used as a sole basis for treatment. Nasal washings and aspirates are unacceptable for Xpert Xpress SARS-CoV-2/FLU/RSV testing.  Fact Sheet for Patients: EntrepreneurPulse.com.au  Fact Sheet for Healthcare Providers: IncredibleEmployment.be  This test is not yet approved or cleared by the [place] and has been authorized  for detection and/or diagnosis of SARS-CoV-2 by FDA under an Emergency Use Authorization (EUA). This EUA will remain in effect (meaning this test can be used) for the duration of the COVID-19 declaration under Section 564(b)(1) of the Act, 21 U.S.C. section 360bbb-3(b)(1), unless the authorization is terminated or revoked.  Performed at [facility], [place] [address] [place], [place] [address]     Radiology Studies: No results found.    Ariyonna Twichell T. [place]  If  7PM-7AM, please contact night-coverage [email or web address] 05/13/2021, 2:15 PM

## 2021-05-14 DIAGNOSIS — N1832 Chronic kidney disease, stage 3b: Secondary | ICD-10-CM

## 2021-05-14 DIAGNOSIS — R509 Fever, unspecified: Secondary | ICD-10-CM | POA: Diagnosis not present

## 2021-05-14 DIAGNOSIS — E1165 Type 2 diabetes mellitus with hyperglycemia: Secondary | ICD-10-CM | POA: Diagnosis not present

## 2021-05-14 DIAGNOSIS — I513 Intracardiac thrombosis, not elsewhere classified: Secondary | ICD-10-CM | POA: Diagnosis not present

## 2021-05-14 LAB — CULTURE, BLOOD (ROUTINE X 2)
Culture: NO GROWTH
Culture: NO GROWTH
Special Requests: ADEQUATE
Special Requests: ADEQUATE

## 2021-05-14 LAB — RENAL FUNCTION PANEL
Albumin: 3.1 g/dL — ABNORMAL LOW (ref 3.5–5.0)
Anion gap: 8 (ref 5–15)
BUN: 37 mg/dL — ABNORMAL HIGH (ref 6–20)
CO2: 29 mmol/L (ref 22–32)
Calcium: 9.6 mg/dL (ref 8.9–10.3)
Chloride: 99 mmol/L (ref 98–111)
Creatinine, Ser: 1.58 mg/dL — ABNORMAL HIGH (ref 0.61–1.24)
GFR, Estimated: 50 mL/min — ABNORMAL LOW (ref >60)
Glucose, Bld: 115 mg/dL — ABNORMAL HIGH (ref 70–99)
Phosphorus: 4.7 mg/dL — ABNORMAL HIGH (ref 2.5–4.6)
Potassium: 4.7 mmol/L (ref 3.5–5.1)
Sodium: 136 mmol/L (ref 135–145)

## 2021-05-14 LAB — GLUCOSE, CAPILLARY
Glucose-Capillary: 104 mg/dL — ABNORMAL HIGH (ref 70–99)
Glucose-Capillary: 143 mg/dL — ABNORMAL HIGH (ref 70–99)

## 2021-05-14 LAB — PROTIME-INR
INR: 2 — ABNORMAL HIGH (ref 0.8–1.2)
Prothrombin Time: 22.7 seconds — ABNORMAL HIGH (ref 11.4–15.2)

## 2021-05-14 LAB — CBC
HCT: 41.5 % (ref 39.0–52.0)
Hemoglobin: 13.3 g/dL (ref 13.0–17.0)
MCH: 27.6 pg (ref 26.0–34.0)
MCHC: 32 g/dL (ref 30.0–36.0)
MCV: 86.1 fL (ref 80.0–100.0)
Platelets: 441 10*3/uL — ABNORMAL HIGH (ref 150–400)
RBC: 4.82 MIL/uL (ref 4.22–5.81)
RDW: 14.1 % (ref 11.5–15.5)
WBC: 12.9 10*3/uL — ABNORMAL HIGH (ref 4.0–10.5)
nRBC: 0 % (ref 0.0–0.2)

## 2021-05-14 LAB — MAGNESIUM: Magnesium: 2.2 mg/dL (ref 1.7–2.4)

## 2021-05-14 MED ORDER — ALLOPURINOL 100 MG PO TABS: 100.0000 mg | ORAL_TABLET | Freq: Every day | ORAL | Status: DC

## 2021-05-14 MED ORDER — ASPIRIN 81 MG PO TBEC: 81.0000 mg | DELAYED_RELEASE_TABLET | Freq: Every day | ORAL | 11 refills | Status: DC

## 2021-05-14 MED ORDER — LINAGLIPTIN 5 MG PO TABS: 5.0000 mg | ORAL_TABLET | Freq: Every day | ORAL | Status: DC

## 2021-05-14 MED ORDER — ATORVASTATIN CALCIUM 40 MG PO TABS: 40.0000 mg | ORAL_TABLET | Freq: Every day | ORAL | Status: DC

## 2021-05-14 NOTE — Progress Notes (Signed)
DISCHARGE NOTE [facility] to be discharged [facility] per MD order. Patient verbalized understanding.  Skin clean, dry and intact without evidence of skin break down, no evidence of skin tears noted. IV catheter discontinued intact. Site without signs and symptoms of complications. Dressing and pressure applied. Pt denies pain at the site currently. No complaints noted.  Patient free of lines, drains, and wounds.   Discharge packet assembled. An After Visit Summary (AVS) was printed and given to his fiancee. Patient escorted via stretcher and discharged to [facility] via fiancee's car.. Report called to accepting facility but it was on a voicemail twice.R.N. leave a number for a call back.  [place], Zenon Mayo, RN

## 2021-05-14 NOTE — Progress Notes (Signed)
Tried to call for a report twice,both times ,it is in voicemail.R.N. leave a number for a return call.

## 2021-05-14 NOTE — Progress Notes (Signed)
Patient refused to be transported to rehab by EMS.

## 2021-05-14 NOTE — Discharge Summary (Signed)
Physician Discharge Summary  [name] TMA:[id number] DOB: 1961/03/11 DOA: 05/05/2021  PCP: Flossie Buffy, NP  Admit date: 05/05/2021 Discharge date: 05/14/2021  Admitted From: Home Disposition: SNF  Recommendations for Outpatient Follow-up:  1. Follow ups as below. 2. Please obtain CBC/BMP/Mag/uric acid in 1 to 2 weeks 3. Recommend outpatient referral to vascular surgery for PAD 4. Please follow up on the following pending results: None   Discharge Condition: Stable CODE STATUS: Full code   Follow-up Information    Nche, Charlene Brooke, NP. Schedule an appointment as soon as possible for a visit in 1 week(s).   Specialty: Internal Medicine Contact information: [place] [address] [phone]        Sueanne Margarita, MD. Schedule an appointment as soon as possible for a visit in 2 week(s).   Specialty: Cardiology Contact information: [address] [place] [address] [phone]               Hospital Course: 60 year old M with PMH of DM-2, LV thrombus/A-flutter on Coumadin, bioprosthetic AVR, HTN, systolic CHF, TDH-7C and obesity presenting with bilateral feet pain and swelling as well as productive cough, and admitted for fever of unknown origin, bilateral feet pain and acute on chronic systolic CHF.  Patient was febrile to 101.5 on presentation.  Infectious work-up including cultures, bilateral foot and ankle x-ray and MRI, TTE, and CT chest/abdomen/pelvis unrevealing.  Infectious disease consulted.  Patient received azithromycin for 3 days.  Uric acid was elevated, and he was a started on colchicine and prednisone.  ABI also consistent with bilateral PAD.  Started on atorvastatin and aspirin.  He needs outpatient follow-up with vascular surgery.  Therapy recommended SNF.  Discharge Diagnoses:  Bilateral feet pain/acute gout of bilateral feet/neuropathic pain-Uric acid elevated.  Inflammatory markers elevated  but improved.  -P.o. prednisone 5/23-5/26 -Continue p.o. colchicine. -Started p.o. allopurinol 100 mg daily on 5/26 -Recheck uric acid in a couple of weeks  Right wrist pain: Could be due to gout.  Seems to have resolved. -Treatment as above  Bilateral lower extremity PAD -Started aspirin and atorvastatin -Outpatient follow-up with vascular surgery  Fever of unknown origin: likely due to gout.  Last true fever was on 5/19 Drenching night sweats -Infectious work-up unrevealing. -ID signed off.  Acute on chronic systolic heart failure: TTE with LVEF of 20 to 25%, GH, moderate LVH mildly reduced RVSF, moderate LAE.  Appears euvolemic on exam.  BNP was 1072.  Net -7 L.  Renal function is stable -Continue home p.o. Lasix 40 mg daily -Continue Entresto, Imdur, hydralazine and Jardiance as below -No room for beta-blocker due to bradycardia -Monitor fluid status and renal function  History LV thrombus/A. flutter: INR therapeutic at 2.0.  Goal INR 2.0-3.0. -Continue Coumadin  History of bioprosthetic AVR-stable  Elevated troponin: Peaked at 84.  Likely demand ischemia -Cardiac meds as above -Continue aspirin and atorvastatin given PAD  Essential hypertension: Normotensive. -Cardiac meds as above  Uncontrolled DM-2 with neuropathy, hyperglycemia and macrovascular complication: B6L 8.4%.  On Jardiance at home.  Hyperglycemia likely due to steroid. Recent Labs  Lab 05/13/21 0714 05/13/21 1130 05/13/21 1658 05/13/21 2049 05/14/21 0701  GLUCAP 101* 126* 129* 194* 104*  -Continue Jardiance, Tradjenta and atorvastatin   CKD-3A/azotemia: Relatively stable. Recent Labs    01/17/21 1537 02/09/21 1448 05/02/21 2033 05/05/21 1600 05/06/21 1321 05/10/21 0359 05/11/21 0405 05/12/21 0351 05/13/21 0613 05/14/21 0242  BUN 22* 21* 19 23* 28* 26*  32* 35* 34* 37*  CREATININE 1.74* 1.65* 1.70* 1.66* 1.69* 1.40* 1.52* 1.65* 1.51* 1.58*  -Recheck renal function in 1  week  Liver cirrhosis: Incidental finding on CT abdomen and pelvis.  Reports alcohol in the past.  Acute hepatitis panel negative. -Encourage weight loss  Morbid obesity Body mass index is 36.9 kg/m.  -Encourage weight loss          Discharge Exam: Vitals:   05/14/21 0457 05/14/21 0934  BP: 120/71 113/62  Pulse: (!) 59 (!) 52  Resp: 19 20  Temp: 98.4 F (36.9 C) 98.1 F (36.7 C)  SpO2: 98% 96%    GENERAL: No apparent distress.  Nontoxic. HEENT: MMM.  Vision and hearing grossly intact.  NECK: Supple.  No apparent JVD.  RESP: On RA.  No IWOB.  Fair aeration bilaterally. CVS:  RRR.  2/6 SEM over RUSB and LUSB ABD/GI/GU: Bowel sounds present. Soft. Non tender.  MSK/EXT:  Moves extremities. No apparent deformity. No edema. SKIN: no apparent skin lesion or wound NEURO: Awake, alert and oriented appropriately.  No apparent focal neuro deficit. PSYCH: Calm. Normal affect.   Discharge Instructions  Discharge Instructions    Amb Referral to Nutrition and Diabetic Education   Complete by: As directed    Diet - low sodium heart healthy   Complete by: As directed    Diet Carb Modified   Complete by: As directed    Discharge patient   Complete by: As directed    Discharge disposition: 01-Home or Self Care   Discharge patient date: 05/07/2021   Increase activity slowly   Complete by: As directed      Allergies as of 05/14/2021   No Known Allergies     Medication List    STOP taking these medications   amoxicillin 500 MG tablet Commonly known as: AMOXIL     TAKE these medications   allopurinol 100 MG tablet Commonly known as: ZYLOPRIM Take 1 tablet (100 mg total) by mouth daily.   aspirin 81 MG EC tablet Take 1 tablet (81 mg total) by mouth daily. Swallow whole.   atorvastatin 40 MG tablet Commonly known as: LIPITOR Take 1 tablet (40 mg total) by mouth daily at 6 PM.   colchicine 0.6 MG tablet Take 1 tablet (0.6 mg total) by mouth daily.    empagliflozin 10 MG Tabs tablet Commonly known as: Jardiance Take 1 tablet (10 mg total) by mouth daily before breakfast.   furosemide 40 MG tablet Commonly known as: LASIX Take 1 tablet (40 mg total) by mouth daily.   gabapentin 300 MG capsule Commonly known as: NEURONTIN Take 1 capsule (300 mg total) by mouth 3 (three) times daily. What changed:   medication strength  how much to take   hydrALAZINE 25 MG tablet Commonly known as: APRESOLINE Take 25 mg by mouth 3 (three) times daily.   isosorbide mononitrate 30 MG 24 hr tablet Commonly known as: IMDUR Take 1 tablet (30 mg total) by mouth daily. What changed:   medication strength  how much to take   linagliptin 5 MG Tabs tablet Commonly known as: TRADJENTA Take 1 tablet (5 mg total) by mouth daily.   predniSONE 20 MG tablet Commonly known as: DELTASONE Take 1 tablet (20 mg total) by mouth daily with breakfast for 4 days.   sacubitril-valsartan 97-103 MG Commonly known as: ENTRESTO Take 1 tablet by mouth 2 (two) times daily.   spironolactone 25 MG tablet Commonly known as: ALDACTONE Take 0.5 tablets (12.5  mg total) by mouth daily. What changed: how much to take   warfarin 5 MG tablet Commonly known as: COUMADIN Take as directed. If you are unsure how to take this medication, talk to your nurse or doctor. Original instructions: Take 1 and 1/2 tablets by mouth daily except 2 tablets on Monday, Wednesday, and Friday or as directed by Anticoagulation Clinic.       Consultations:  Infectious disease  Procedures/Studies: 1. No apical thrombus. Left ventricular ejection fraction, by estimation,  is 20 to 25%. The left ventricle has severely decreased function. The left  ventricle demonstrates global hypokinesis. The left ventricular internal  cavity size was moderately  dilated. There is moderate left ventricular hypertrophy. Left ventricular  diastolic function could not be evaluated.  2. Right  ventricular systolic function is mildly reduced. The right  ventricular size is mildly enlarged. There is moderately elevated  pulmonary artery systolic pressure.  3. Left atrial size was moderately dilated.  4. The mitral valve is abnormal. Mild mitral valve regurgitation.  5. The aortic valve has been repaired/replaced. Aortic valve  regurgitation is mild. There is a 27 mm [facility] Ease valve present  in the aortic position. Procedure Date: 04/05/2015. Aortic valve mean  gradient measures 12.0 mmHg. DI is 0.32  6. The inferior vena cava is dilated in size with <50% respiratory  variability, suggesting right atrial pressure of 15 mmHg.    CT ABDOMEN PELVIS WO CONTRAST  Result Date: 05/10/2021 CLINICAL DATA:  Fever of unknown origin, night sweats EXAM: CT ABDOMEN AND PELVIS WITHOUT CONTRAST TECHNIQUE: Multidetector CT imaging of the abdomen and pelvis was performed following the standard protocol without IV contrast. COMPARISON:  04/16/2020 FINDINGS: Lower chest: The visualized lung bases are clear. Mild to moderate cardiomegaly is stable. Hepatobiliary: The liver contour is nodular in keeping with changes of underlying cirrhosis. Geographic fatty hepatic infiltration noted within the right hepatic lobe. No definite intrahepatic mass lesion identified on this noncontrast examination. The gallbladder is decompressed but is otherwise unremarkable. No intra or extrahepatic biliary ductal dilation. Pancreas: Unremarkable Spleen: Unremarkable Adrenals/Urinary Tract: Adrenal glands are unremarkable. Kidneys are normal, without renal calculi, focal lesion, or hydronephrosis. Bladder is unremarkable. Stomach/Bowel: Stomach is within normal limits. Appendix appears normal. No evidence of bowel wall thickening, distention, or inflammatory changes. No free intraperitoneal gas or fluid. Vascular/Lymphatic: Mild aortoiliac atherosclerotic calcification. No aortic aneurysm. No pathologic adenopathy within  the abdomen and pelvis. Reproductive: Prostate is unremarkable. Other: Small bilateral fat containing inguinal hernias. Tiny fat containing umbilical hernia. Rectum unremarkable. Musculoskeletal: No acute bone abnormality. No lytic or blastic bone lesion. IMPRESSION: No acute intra-abdominal pathology identified. No definite radiographic source for the patient's reported fever. Stable mild-to-moderate cardiomegaly. Stable findings in keeping with cirrhosis and geographic fatty hepatic infiltration. Aortic Atherosclerosis (ICD10-I70.0). Electronically Signed   By: Fidela Salisbury MD   On: 05/10/2021 23:35   DG Chest 1 View  Result Date: 05/05/2021 CLINICAL DATA:  Chest pain. EXAM: CHEST  1 VIEW COMPARISON:  May 02, 2021. FINDINGS: Stable cardiomegaly. Sternotomy wires are noted. No pneumothorax or pleural effusion is noted. Both lungs are clear. The visualized skeletal structures are unremarkable. IMPRESSION: No active disease. Electronically Signed   By: Marijo Conception M.D.   On: 05/05/2021 16:22   DG Chest 2 View  Result Date: 05/02/2021 CLINICAL DATA:  Shortness of breath EXAM: CHEST - 2 VIEW COMPARISON:  01/10/2021 FINDINGS: Post sternotomy changes and valve prosthesis. Moderate cardiomegaly without edema, pleural effusion, pneumothorax or  focal airspace disease. IMPRESSION: No active cardiopulmonary disease.  Cardiomegaly Electronically Signed   By: Donavan Foil M.D.   On: 05/02/2021 21:15   DG Wrist 2 Views Right  Result Date: 05/08/2021 CLINICAL DATA:  Wrist pain that began yesterday uncertain etiology. Affected grip strength. EXAM: RIGHT WRIST - 2 VIEW COMPARISON:  May 15, 2019 FINDINGS: There is no evidence of fracture or dislocation. There is no evidence of arthropathy or other focal bone abnormality. IMPRESSION: Negative. Electronically Signed   By: Zetta Bills M.D.   On: 05/08/2021 09:27   DG Ankle Complete Left  Result Date: 05/05/2021 CLINICAL DATA:  Ankle pain and fevers, initial  encounter EXAM: LEFT ANKLE COMPLETE - 3+ VIEW COMPARISON:  None. FINDINGS: There is no evidence of fracture, dislocation, or joint effusion. Mild tarsal degenerative changes are noted. No acute soft tissue abnormality is seen. IMPRESSION: No acute abnormality noted. Electronically Signed   By: Inez Catalina M.D.   On: 05/05/2021 22:03   DG Ankle Complete Right  Result Date: 05/05/2021 CLINICAL DATA:  Ankle pain and fevers, initial encounter EXAM: RIGHT ANKLE - COMPLETE 3+ VIEW COMPARISON:  None. FINDINGS: Calcaneal spurring is noted. No significant soft tissue abnormality is noted. Tiny bony density is noted adjacent to the distal fibula likely related to prior trauma and nonunion. No new focal abnormality is seen. IMPRESSION: Chronic changes without acute abnormality. Electronically Signed   By: Inez Catalina M.D.   On: 05/05/2021 22:02   CT Chest Wo Contrast  Result Date: 05/05/2021 CLINICAL DATA:  Persistent cough EXAM: CT CHEST WITHOUT CONTRAST TECHNIQUE: Multidetector CT imaging of the chest was performed following the standard protocol without IV contrast. COMPARISON:  Chest x-ray from earlier in the same day, CT from 04/16/2020. FINDINGS: Cardiovascular: Somewhat limited due to lack of IV contrast. Aortic calcifications are noted without aneurysmal dilatation. Heart is enlarged in size similar to that seen on prior chest x-ray. Aortic valve calcifications are noted. Mediastinum/Nodes: Thoracic inlet demonstrates some scattered calcifications within the thyroid although no discrete nodule is seen. No sizable hilar or mediastinal adenopathy is noted. The esophagus is within normal limits. Lungs/Pleura: Mild scarring is noted in the bases bilaterally. No sizable effusion is seen. No focal infiltrate or sizable parenchymal nodule is noted. Upper Abdomen: Visualized upper abdomen shows no acute abnormality. Musculoskeletal: Degenerative changes of the thoracic spine are noted. IMPRESSION: Mild scarring in  the bases bilaterally. Stable cardiomegaly. No acute abnormality is noted. Aortic Atherosclerosis (ICD10-I70.0). Electronically Signed   By: Inez Catalina M.D.   On: 05/05/2021 21:42   MR FOOT RIGHT WO CONTRAST  Result Date: 05/09/2021 CLINICAL DATA:  Bilateral foot and ankle pain and swelling. The patient is unable to walk. Fever. EXAM: MRI OF THE RIGHT FOREFOOT WITHOUT CONTRAST; MRI OF THE RIGHT ANKLE WITHOUT CONTRAST TECHNIQUE: Multiplanar, multisequence MR imaging of the right ankle and forefoot was performed. No intravenous contrast was administered. COMPARISON:  Plain films right foot 05/05/2022. FINDINGS: Bones/Joint/Cartilage Marrow edema is seen throughout almost the entire medial cuneiform and about the second, third and fourth tarsometatarsal joints. Edema appears worst at the articulation of the middle and medial cuneiforms and the second tarsometatarsal joint. An intraosseous geode is seen in the talus at the posterior facet of the subtalar joint. No joint effusion is identified. No fracture. Ligaments Intact. Muscles and Tendons No intramuscular fluid collection. There is mild fatty atrophy of intrinsic musculature the foot. Soft tissues Mild subcutaneous edema about the foot and ankle is noted. IMPRESSION:  Edema about the first through fourth tarsometatarsal joints and articulation of the middle and medial cuneiforms is nonspecific but likely due to degenerative or stress change rather than osteomyelitis. Negative for abscess or joint effusion. Mild subcutaneous edema about the dorsum of the foot could be due to dependent change or cellulitis. Electronically Signed   By: Inge Rise M.D.   On: 05/09/2021 13:27   MR FOOT LEFT WO CONTRAST  Result Date: 05/09/2021 CLINICAL DATA:  Left foot and ankle pain. History of fevers. The patient is unable to walk. Question septic arthritis. EXAM: MRI OF THE LEFT FOOT WITHOUT CONTRAST; MRI OF THE LEFT ANKLE WITHOUT CONTRAST TECHNIQUE: Multiplanar,  multisequence MR imaging of the left foot and ankle was performed. No intravenous contrast was administered. COMPARISON:  Plain films left foot 05/05/2021. FINDINGS: Bones/Joint/Cartilage This examination is somewhat degraded by patient motion. No joint effusion is seen. No fracture or stress change. Mild marrow edema is seen about the second, third and fourth tarsometatarsal joints. Imaged bones otherwise appear normal. Ligaments Intact and normal in appearance. Muscles and Tendons No intramuscular fluid collection is identified. No tendon tear or strain. There is mild atrophy of intrinsic musculature the foot. Soft tissues No focal fluid collection is identified. Mild to moderate subcutaneous edema over the dorsum of the foot and about the ankle is noted. IMPRESSION: Mild marrow edema about the second, third and fourth tarsometatarsal joints without effusion is likely due to stress change or mild degenerative disease. The appearance is not typical of osteomyelitis although infection cannot be completely excluded. Negative for abscess. Subcutaneous edema about the foot and ankle could be due to dependent change or cellulitis. Electronically Signed   By: Inge Rise M.D.   On: 05/09/2021 13:20   MR ANKLE RIGHT WO CONTRAST  Result Date: 05/09/2021 CLINICAL DATA:  Bilateral foot and ankle pain and swelling. The patient is unable to walk. Fever. EXAM: MRI OF THE RIGHT FOREFOOT WITHOUT CONTRAST; MRI OF THE RIGHT ANKLE WITHOUT CONTRAST TECHNIQUE: Multiplanar, multisequence MR imaging of the right ankle and forefoot was performed. No intravenous contrast was administered. COMPARISON:  Plain films right foot 05/05/2022. FINDINGS: Bones/Joint/Cartilage Marrow edema is seen throughout almost the entire medial cuneiform and about the second, third and fourth tarsometatarsal joints. Edema appears worst at the articulation of the middle and medial cuneiforms and the second tarsometatarsal joint. An intraosseous geode  is seen in the talus at the posterior facet of the subtalar joint. No joint effusion is identified. No fracture. Ligaments Intact. Muscles and Tendons No intramuscular fluid collection. There is mild fatty atrophy of intrinsic musculature the foot. Soft tissues Mild subcutaneous edema about the foot and ankle is noted. IMPRESSION: Edema about the first through fourth tarsometatarsal joints and articulation of the middle and medial cuneiforms is nonspecific but likely due to degenerative or stress change rather than osteomyelitis. Negative for abscess or joint effusion. Mild subcutaneous edema about the dorsum of the foot could be due to dependent change or cellulitis. Electronically Signed   By: Inge Rise M.D.   On: 05/09/2021 13:27   MR ANKLE LEFT WO CONTRAST  Result Date: 05/09/2021 CLINICAL DATA:  Left foot and ankle pain. History of fevers. The patient is unable to walk. Question septic arthritis. EXAM: MRI OF THE LEFT FOOT WITHOUT CONTRAST; MRI OF THE LEFT ANKLE WITHOUT CONTRAST TECHNIQUE: Multiplanar, multisequence MR imaging of the left foot and ankle was performed. No intravenous contrast was administered. COMPARISON:  Plain films left foot 05/05/2021. FINDINGS: Bones/Joint/Cartilage  This examination is somewhat degraded by patient motion. No joint effusion is seen. No fracture or stress change. Mild marrow edema is seen about the second, third and fourth tarsometatarsal joints. Imaged bones otherwise appear normal. Ligaments Intact and normal in appearance. Muscles and Tendons No intramuscular fluid collection is identified. No tendon tear or strain. There is mild atrophy of intrinsic musculature the foot. Soft tissues No focal fluid collection is identified. Mild to moderate subcutaneous edema over the dorsum of the foot and about the ankle is noted. IMPRESSION: Mild marrow edema about the second, third and fourth tarsometatarsal joints without effusion is likely due to stress change or mild  degenerative disease. The appearance is not typical of osteomyelitis although infection cannot be completely excluded. Negative for abscess. Subcutaneous edema about the foot and ankle could be due to dependent change or cellulitis. Electronically Signed   By: Inge Rise M.D.   On: 05/09/2021 13:20   DG Foot Complete Left  Result Date: 05/05/2021 CLINICAL DATA:  Foot pain and fevers, initial encounter EXAM: LEFT FOOT - COMPLETE 3+ VIEW COMPARISON:  None. FINDINGS: Mild tarsal degenerative changes are noted. No acute fracture or dislocation is noted. No soft tissue abnormality is seen. IMPRESSION: Negative. Electronically Signed   By: Inez Catalina M.D.   On: 05/05/2021 22:03   DG Foot Complete Right  Result Date: 05/05/2021 CLINICAL DATA:  Foot pain and fevers, initial encounter EXAM: RIGHT FOOT COMPLETE - 3+ VIEW COMPARISON:  None. FINDINGS: Calcaneal spurring is noted. Mild tarsal degenerative changes are seen. No acute fracture or dislocation is noted. No soft tissue abnormality is seen. IMPRESSION: No acute abnormality noted. Electronically Signed   By: Inez Catalina M.D.   On: 05/05/2021 22:04   VAS [place] ABI WITH/WO TBI  Result Date: 05/11/2021  LOWER EXTREMITY DOPPLER STUDY Patient Name:  [name]  Date of Exam:   05/11/2021 Medical Rec #: [id number]          Accession #:    [id number] Date of Birth: 10-27-1961           Patient Gender: M Patient Age:   60Y Exam Location:  [facility] Procedure:      VAS [place] ABI WITH/WO TBI Referring Phys: [id number] [facility] M PATEL --------------------------------------------------------------------------------  Indications: Bilateral foot pain. High Risk Factors: Hypertension, hyperlipidemia, Diabetes, no history of                    smoking. Other Factors: CKD3, CHF.  Comparison Study: No previous exams Performing Technologist: Hill, Jody RVT, RDMS  Examination Guidelines: A complete evaluation includes at minimum, Doppler waveform signals and  systolic blood pressure reading at the level of bilateral brachial, anterior tibial, and posterior tibial arteries, when vessel segments are accessible. Bilateral testing is considered an integral part of a complete examination. Photoelectric Plethysmograph (PPG) waveforms and toe systolic pressure readings are included as required and additional duplex testing as needed. Limited examinations for reoccurring indications may be performed as noted.  ABI Findings: +---------+------------------+-----+-------------------+--------+ Right    Rt Pressure (mmHg)IndexWaveform           Comment  +---------+------------------+-----+-------------------+--------+ Brachial 106                    triphasic                   +---------+------------------+-----+-------------------+--------+ PTA      71  0.67 dampened monophasic         +---------+------------------+-----+-------------------+--------+ DP       52                0.49 dampened monophasic         +---------+------------------+-----+-------------------+--------+ Great Toe0                 0.00 Absent                      +---------+------------------+-----+-------------------+--------+ +---------+------------------+-----+----------+-----------------+ Left     Lt Pressure (mmHg)IndexWaveform  Comment           +---------+------------------+-----+----------+-----------------+ Brachial 102                    triphasic                   +---------+------------------+-----+----------+-----------------+ PTA      91                0.86 biphasic  auditory biphasic +---------+------------------+-----+----------+-----------------+ DP       82                0.77 monophasic                  +---------+------------------+-----+----------+-----------------+ Great Toe49                0.46 Abnormal                    +---------+------------------+-----+----------+-----------------+  +-------+-----------+-----------+------------+------------+ ABI/TBIToday's ABIToday's TBIPrevious ABIPrevious TBI +-------+-----------+-----------+------------+------------+ Right  0.67                                           +-------+-----------+-----------+------------+------------+ Left   0.86                                           +-------+-----------+-----------+------------+------------+   Summary: Right: Resting right ankle-brachial index indicates moderate right lower extremity arterial disease. The right toe-brachial index is unobtainable/absent. Left: Resting left ankle-brachial index indicates mild left lower extremity arterial disease. The left toe-brachial index is abnormal.  *See table(s) above for measurements and observations.  Electronically signed by Harold Barban MD on 05/11/2021 at 9:21:16 PM.    Final    ECHOCARDIOGRAM COMPLETE  Result Date: 05/07/2021    ECHOCARDIOGRAM REPORT   Patient Name:   [name] Date of Exam: 05/07/2021 Medical Rec #:  [id number]         Height:       71.0 in Accession #:    [id number]        Weight:       264.6 lb Date of Birth:  August 02, 1961          BSA:          2.375 m Patient Age:    70 years          BP:           101/76 mmHg Patient Gender: M                 HR:           66 bpm. Exam Location:  Inpatient Procedure: 2D Echo, 3D Echo, Color Doppler, Cardiac Doppler and Intracardiac  Opacification Agent Indications:    Fever R50.9  History:        Patient has prior history of Echocardiogram examinations, most                 recent 01/20/2021. CHF, Arrythmias:Atrial Fibrillation; Risk                 Factors:Hypertension, Dyslipidemia and Diabetes. Chronic kidney                 disease.                 Aortic Valve: 27 mm [facility] Ease valve is present in the                 aortic position. Procedure Date: 04/05/2015.  Sonographer:    Darlina Sicilian RDCS Referring Phys: [id number] [place]  1. No  apical thrombus. Left ventricular ejection fraction, by estimation, is 20 to 25%. The left ventricle has severely decreased function. The left ventricle demonstrates global hypokinesis. The left ventricular internal cavity size was moderately dilated. There is moderate left ventricular hypertrophy. Left ventricular diastolic function could not be evaluated.  2. Right ventricular systolic function is mildly reduced. The right ventricular size is mildly enlarged. There is moderately elevated pulmonary artery systolic pressure.  3. Left atrial size was moderately dilated.  4. The mitral valve is abnormal. Mild mitral valve regurgitation.  5. The aortic valve has been repaired/replaced. Aortic valve regurgitation is mild. There is a 27 mm [facility] Ease valve present in the aortic position. Procedure Date: 04/05/2015. Aortic valve mean gradient measures 12.0 mmHg. DI is 0.32  6. The inferior vena cava is dilated in size with <50% respiratory variability, suggesting right atrial pressure of 15 mmHg. Comparison(s): Changes from prior study are noted. 01/20/2021: LVEF 20-25%, global hypokinesis, 27 mm [facility] MagnaEase valve, mean gradient 8 mmHg. Conclusion(s)/Recommendation(s): No evidence of valvular vegetations on this transthoracic echocardiogram, although the gradient across the bioprosthetic aortic valve has increased. Would recommend a transesophageal echocardiogram to exclude infective endocarditis if clinically indicated. FINDINGS  Left Ventricle: No apical thrombus. Left ventricular ejection fraction, by estimation, is 20 to 25%. The left ventricle has severely decreased function. The left ventricle demonstrates global hypokinesis. Definity contrast agent was given IV to delineate the left ventricular endocardial borders. The left ventricular internal cavity size was moderately dilated. There is moderate left ventricular hypertrophy. Left ventricular diastolic function could not be evaluated due to atrial  fibrillation. Left ventricular diastolic function could not be evaluated. Right Ventricle: The right ventricular size is mildly enlarged. No increase in right ventricular wall thickness. Right ventricular systolic function is mildly reduced. There is moderately elevated pulmonary artery systolic pressure. The tricuspid regurgitant velocity is 2.91 m/s, and with an assumed right atrial pressure of 15 mmHg, the estimated right ventricular systolic pressure is 02.7 mmHg. Left Atrium: Left atrial size was moderately dilated. Right Atrium: Right atrial size was normal in size. Pericardium: There is no evidence of pericardial effusion. Mitral Valve: The mitral valve is abnormal. There is mild thickening of the mitral valve leaflet(s). Mild mitral valve regurgitation. Tricuspid Valve: The tricuspid valve is grossly normal. Tricuspid valve regurgitation is mild. Aortic Valve: The aortic valve has been repaired/replaced. Aortic valve regurgitation is mild. Aortic valve mean gradient measures 12.0 mmHg. Aortic valve peak gradient measures 23.6 mmHg. There is a 27 mm [facility] Ease valve present in the aortic position. Procedure Date: 04/05/2015. Pulmonic Valve: The  pulmonic valve was grossly normal. Pulmonic valve regurgitation is trivial. Aorta: The aortic root and ascending aorta are structurally normal, with no evidence of dilitation. Venous: The inferior vena cava is dilated in size with less than 50% respiratory variability, suggesting right atrial pressure of 15 mmHg. IAS/Shunts: No atrial level shunt detected by color flow Doppler.  LEFT VENTRICLE PLAX 2D LVIDd:         6.40 cm LVIDs:         4.85 cm LV PW:         1.70 cm LV IVS:        1.30 cm                        3D Volume EF:                        3D EF:        24 %                        LV EDV:       397 ml                        LV ESV:       302 ml                        LV SV:        95 ml RIGHT VENTRICLE RV S prime:     6.83 cm/s TAPSE (M-mode): 1.2 cm  LEFT ATRIUM              Index       RIGHT ATRIUM           Index LA diam:        5.25 cm  2.21 cm/m  RA Area:     24.60 cm LA Vol (A2C):   106.0 ml 44.64 ml/m RA Volume:   70.30 ml  29.60 ml/m LA Vol (A4C):   112.0 ml 47.16 ml/m LA Biplane Vol: 113.0 ml 47.58 ml/m  AORTIC VALVE AV Vmax:           242.80 cm/s AV Vmean:          154.400 cm/s AV VTI:            0.404 m AV Peak Grad:      23.6 mmHg AV Mean Grad:      12.0 mmHg LVOT Vmax:         80.53 cm/s LVOT Vmean:        50.325 cm/s LVOT VTI:          0.129 m LVOT/AV VTI ratio: 0.32  AORTA Ao Asc diam: 3.10 cm MITRAL VALVE                TRICUSPID VALVE MV Area (PHT): 6.83 cm     TR Peak grad:   33.9 mmHg MV Decel Time: 111 msec     TR Vmax:        291.00 cm/s MV E velocity: 101.60 cm/s                             SHUNTS  Systemic VTI: 0.13 m Lyman Bishop MD Electronically signed by Lyman Bishop MD Signature Date/Time: 05/07/2021/4:35:54 PM    Final    VAS [place] LOWER EXTREMITY VENOUS (DVT)  Result Date: 05/06/2021  Lower Venous DVT Study Patient Name:  [name]  Date of Exam:   05/06/2021 Medical Rec #: [id number]          Accession #:    [id number] Date of Birth: [birth year]           Patient Gender: M Patient Age:   60Y Exam Location:  [facility] Procedure:      VAS [place] LOWER EXTREMITY VENOUS (DVT) Referring Phys: [id number] Noland Fordyce MATHEWS --------------------------------------------------------------------------------  Indications: Edema.  Comparison Study: 05-11-2019 Bilateral lower extremity venous study was negative                   for DVT. Performing Technologist: Abram Sander RVS  Examination Guidelines: A complete evaluation includes B-mode imaging, spectral Doppler, color Doppler, and power Doppler as needed of all accessible portions of each vessel. Bilateral testing is considered an integral part of a complete examination. Limited examinations for reoccurring indications may be performed as noted.  The reflux portion of the exam is performed with the patient in reverse Trendelenburg.  +---------+---------------+---------+-----------+----------+--------------+ RIGHT    CompressibilityPhasicitySpontaneityPropertiesThrombus Aging +---------+---------------+---------+-----------+----------+--------------+ CFV      Full           Yes      Yes                                 +---------+---------------+---------+-----------+----------+--------------+ SFJ      Full                                                        +---------+---------------+---------+-----------+----------+--------------+ FV Prox  Full                                                        +---------+---------------+---------+-----------+----------+--------------+ FV Mid   Full                                                        +---------+---------------+---------+-----------+----------+--------------+ FV DistalFull                                                        +---------+---------------+---------+-----------+----------+--------------+ PFV      Full                                                        +---------+---------------+---------+-----------+----------+--------------+ POP      Full  Yes      Yes                                 +---------+---------------+---------+-----------+----------+--------------+ PTV      Full                                                        +---------+---------------+---------+-----------+----------+--------------+ PERO     Full                                                        +---------+---------------+---------+-----------+----------+--------------+   +---------+---------------+---------+-----------+----------+--------------+ LEFT     CompressibilityPhasicitySpontaneityPropertiesThrombus Aging +---------+---------------+---------+-----------+----------+--------------+ CFV      Full           Yes       Yes                                 +---------+---------------+---------+-----------+----------+--------------+ SFJ      Full                                                        +---------+---------------+---------+-----------+----------+--------------+ FV Prox  Full                                                        +---------+---------------+---------+-----------+----------+--------------+ FV Mid   Full                                                        +---------+---------------+---------+-----------+----------+--------------+ FV DistalFull                                                        +---------+---------------+---------+-----------+----------+--------------+ PFV      Full                                                        +---------+---------------+---------+-----------+----------+--------------+ POP      Full           Yes      Yes                                 +---------+---------------+---------+-----------+----------+--------------+ PTV  Full                                                        +---------+---------------+---------+-----------+----------+--------------+ PERO     Full                                                        +---------+---------------+---------+-----------+----------+--------------+     Summary: RIGHT: - There is no evidence of deep vein thrombosis in the lower extremity.  - No cystic structure found in the popliteal fossa.  LEFT: - There is no evidence of deep vein thrombosis in the lower extremity.  - No cystic structure found in the popliteal fossa.  *See table(s) above for measurements and observations. Electronically signed by Deitra Mayo MD on 05/06/2021 at 8:15:33 PM.    Final         The results of significant diagnostics from this hospitalization (including imaging, microbiology, ancillary and laboratory) are listed below for reference.     Microbiology: Recent  Results (from the past 240 hour(s))  Resp Panel by RT-PCR (Flu A&B, Covid) Nasopharyngeal Swab     Status: None   Collection Time: 05/05/21  8:24 PM   Specimen: Nasopharyngeal Swab; Nasopharyngeal(NP) swabs in vial transport medium  Result Value Ref Range Status   SARS Coronavirus 2 by RT PCR NEGATIVE NEGATIVE Final    Comment: (NOTE) SARS-CoV-2 target nucleic acids are NOT DETECTED.  The SARS-CoV-2 RNA is generally detectable in upper respiratory specimens during the acute phase of infection. The lowest concentration of SARS-CoV-2 viral copies this assay can detect is 138 copies/mL. A negative result does not preclude SARS-Cov-2 infection and should not be used as the sole basis for treatment or other patient management decisions. A negative result may occur with  improper specimen collection/handling, submission of specimen other than nasopharyngeal swab, presence of viral mutation(s) within the areas targeted by this assay, and inadequate number of viral copies(<138 copies/mL). A negative result must be combined with clinical observations, patient history, and epidemiological information. The expected result is Negative.  Fact Sheet for Patients:  EntrepreneurPulse.com.au  Fact Sheet for Healthcare Providers:  IncredibleEmployment.be  This test is no t yet approved or cleared by the [place] FDA and  has been authorized for detection and/or diagnosis of SARS-CoV-2 by FDA under an Emergency Use Authorization (EUA). This EUA will remain  in effect (meaning this test can be used) for the duration of the COVID-19 declaration under Section 564(b)(1) of the Act, 21 U.S.C.section 360bbb-3(b)(1), unless the authorization is terminated  or revoked sooner.       Influenza A by PCR NEGATIVE NEGATIVE Final   Influenza B by PCR NEGATIVE NEGATIVE Final    Comment: (NOTE) The Xpert Xpress SARS-CoV-2/FLU/RSV plus assay is intended as an aid in the  diagnosis of influenza from Nasopharyngeal swab specimens and should not be used as a sole basis for treatment. Nasal washings and aspirates are unacceptable for Xpert Xpress SARS-CoV-2/FLU/RSV testing.  Fact Sheet for Patients: EntrepreneurPulse.com.au  Fact Sheet for Healthcare Providers: IncredibleEmployment.be  This test is not yet approved or cleared by the [place] FDA and has been authorized for detection and/or  diagnosis of SARS-CoV-2 by FDA under an Emergency Use Authorization (EUA). This EUA will remain in effect (meaning this test can be used) for the duration of the COVID-19 declaration under Section 564(b)(1) of the Act, 21 U.S.C. section 360bbb-3(b)(1), unless the authorization is terminated or revoked.  Performed at [facility], [place] [address] [place], [place] [address]   Culture, blood (routine x 2)     Status: None   Collection Time: 05/05/21  8:40 PM   Specimen: BLOOD  Result Value Ref Range Status   Specimen Description BLOOD RIGHT ANTECUBITAL  Final   Special Requests   Final    BOTTLES DRAWN AEROBIC AND ANAEROBIC Blood Culture adequate volume   Culture   Final    NO GROWTH 5 DAYS Performed at [facility], [place] [address] [place], [place] [address]    Report Status 05/11/2021 FINAL  Final  Culture, blood (routine x 2)     Status: None   Collection Time: 05/05/21  8:45 PM   Specimen: BLOOD RIGHT HAND  Result Value Ref Range Status   Specimen Description BLOOD RIGHT HAND  Final   Special Requests   Final    BOTTLES DRAWN AEROBIC AND ANAEROBIC Blood Culture adequate volume   Culture   Final    NO GROWTH 5 DAYS Performed at [facility], [place] [address] [place], [place] [address]    Report Status 05/11/2021 FINAL  Final  Urine culture     Status: None   Collection Time: 05/05/21 10:36 PM   Specimen: Urine, Random  Result Value Ref Range Status   Specimen Description URINE, RANDOM  Final    Special Requests NONE  Final   Culture   Final    NO GROWTH Performed at [facility], [place] [address] [place], [place] [address]    Report Status 05/07/2021 FINAL  Final  MRSA PCR Screening     Status: None   Collection Time: 05/06/21  8:01 PM   Specimen: Nasopharyngeal  Result Value Ref Range Status   MRSA by PCR NEGATIVE NEGATIVE Final    Comment:        The GeneXpert MRSA Assay (FDA approved for NASAL specimens only), is one component of a comprehensive MRSA colonization surveillance program. It is not intended to diagnose MRSA infection nor to guide or monitor treatment for MRSA infections. Performed at [facility], [place] [address], [place], [place] [address]   Culture, blood (Routine X 2) w Reflex to ID Panel     Status: None   Collection Time: 05/09/21  3:26 PM   Specimen: BLOOD  Result Value Ref Range Status   Specimen Description BLOOD LEFT ANTECUBITAL  Final   Special Requests   Final    BOTTLES DRAWN AEROBIC AND ANAEROBIC Blood Culture adequate volume   Culture   Final    NO GROWTH 5 DAYS Performed at [facility], [place] [address], [place],  [address]    Report Status 05/14/2021 FINAL  Final  Culture, blood (Routine X 2) w Reflex to ID Panel     Status: None   Collection Time: 05/09/21  3:26 PM   Specimen: BLOOD  Result Value Ref Range Status   Specimen Description BLOOD LEFT ANTECUBITAL  Final   Special Requests   Final    BOTTLES DRAWN AEROBIC AND ANAEROBIC Blood Culture adequate volume   Culture   Final    NO GROWTH 5 DAYS Performed at  [facility], [place] [address], [place], [place] [address]    Report Status 05/14/2021 FINAL  Final  Resp Panel by RT-PCR (Flu A&B, Covid) Nasopharyngeal Swab     Status: None   Collection Time: 05/11/21 11:54 AM   Specimen: Nasopharyngeal Swab; Nasopharyngeal(NP) swabs in vial transport medium  Result Value Ref Range Status   SARS Coronavirus 2 by RT PCR NEGATIVE NEGATIVE Final    Comment:  (NOTE) SARS-CoV-2 target nucleic acids are NOT DETECTED.  The SARS-CoV-2 RNA is generally detectable in upper respiratory specimens during the acute phase of infection. The lowest concentration of SARS-CoV-2 viral copies this assay can detect is 138 copies/mL. A negative result does not preclude SARS-Cov-2 infection and should not be used as the sole basis for treatment or other patient management decisions. A negative result may occur with  improper specimen collection/handling, submission of specimen other than nasopharyngeal swab, presence of viral mutation(s) within the areas targeted by this assay, and inadequate number of viral copies(<138 copies/mL). A negative result must be combined with clinical observations, patient history, and epidemiological information. The expected result is Negative.  Fact Sheet for Patients:  EntrepreneurPulse.com.au  Fact Sheet for Healthcare Providers:  IncredibleEmployment.be  This test is no t yet approved or cleared by the [place] FDA and  has been authorized for detection and/or diagnosis of SARS-CoV-2 by FDA under an Emergency Use Authorization (EUA). This EUA will remain  in effect (meaning this test can be used) for the duration of the COVID-19 declaration under Section 564(b)(1) of the Act, 21 U.S.C.section 360bbb-3(b)(1), unless the authorization is terminated  or revoked sooner.       Influenza A by PCR NEGATIVE NEGATIVE Final   Influenza B by PCR NEGATIVE NEGATIVE Final    Comment: (NOTE) The Xpert Xpress SARS-CoV-2/FLU/RSV plus assay is intended as an aid in the diagnosis of influenza from Nasopharyngeal swab specimens and should not be used as a sole basis for treatment. Nasal washings and aspirates are unacceptable for Xpert Xpress SARS-CoV-2/FLU/RSV testing.  Fact Sheet for Patients: EntrepreneurPulse.com.au  Fact Sheet for Healthcare  Providers: IncredibleEmployment.be  This test is not yet approved or cleared by the [place] FDA and has been authorized for detection and/or diagnosis of SARS-CoV-2 by FDA under an Emergency Use Authorization (EUA). This EUA will remain in effect (meaning this test can be used) for the duration of the COVID-19 declaration under Section 564(b)(1) of the Act, 21 U.S.C. section 360bbb-3(b)(1), unless the authorization is terminated or revoked.  Performed at [facility], [place] [address], [place], [place] [address]      Labs:  CBC: Recent Labs  Lab 05/10/21 [phone] 05/11/21 0405 05/13/21 0613 05/14/21 0242  WBC 12.7* 14.7* 13.2* 12.9*  HGB 13.8 13.6 13.9 13.3  HCT 41.7 42.4 43.3 41.5  MCV 84.9 86.2 86.3 86.1  PLT 369 414* 425* 441*   BMP &GFR Recent Labs  Lab 05/10/21 0359 05/11/21 0405 05/12/21 0351 05/13/21 0613 05/14/21 0242  NA 134* 134* 136 134* 136  K 4.4 4.7 4.5 4.8 4.7  CL 102 99 101 102 99  CO2 24 26 30 24 29   GLUCOSE 143* 141* 139* 99 115*  BUN 26* 32* 35* 34* 37*  CREATININE 1.40* 1.52* 1.65* 1.51* 1.58*  CALCIUM 9.5 10.0 9.5 9.5 9.6  MG  --   --  2.5* 2.4 2.2  PHOS  --   --  4.9* 4.9* 4.7*   Estimated Creatinine Clearance: 65.5 mL/min (A) (by  C-G formula based on SCr of 1.58 mg/dL (H)). Liver & Pancreas: Recent Labs  Lab 05/10/21 0359 05/11/21 0405 05/12/21 0351 05/13/21 0613 05/14/21 0242  AST 13* 16  --   --   --   ALT 18 21  --   --   --   ALKPHOS 88 89  --   --   --   BILITOT 1.1 0.6  --   --   --   PROT 7.0 7.2  --   --   --   ALBUMIN 3.0* 3.1* 2.9* 3.1* 3.1*   No results for input(s): LIPASE, AMYLASE in the last 168 hours. No results for input(s): AMMONIA in the last 168 hours. Diabetic: No results for input(s): HGBA1C in the last 72 hours. Recent Labs  Lab 05/13/21 0714 05/13/21 1130 05/13/21 1658 05/13/21 2049 05/14/21 0701  GLUCAP 101* 126* 129* 194* 104*   Cardiac Enzymes: No results for  input(s): CKTOTAL, CKMB, CKMBINDEX, TROPONINI in the last 168 hours. No results for input(s): PROBNP in the last 8760 hours. Coagulation Profile: Recent Labs  Lab 05/09/21 0434 05/10/21 0359 05/11/21 0405 05/13/21 0613 05/14/21 0242  INR 2.1* 2.3* 2.2* 2.0* 2.0*   Thyroid Function Tests: No results for input(s): TSH, T4TOTAL, FREET4, T3FREE, THYROIDAB in the last 72 hours. Lipid Profile: Recent Labs    05/12/21 0351  CHOL 174  HDL 28*  LDLCALC 121*  TRIG 126  CHOLHDL 6.2   Anemia Panel: No results for input(s): VITAMINB12, FOLATE, FERRITIN, TIBC, IRON, RETICCTPCT in the last 72 hours. Urine analysis:    Component Value Date/Time   COLORURINE YELLOW 05/05/2021 2236   APPEARANCEUR CLEAR 05/05/2021 2236   LABSPEC 1.017 05/05/2021 2236   PHURINE 5.0 05/05/2021 2236   GLUCOSEU >=500 (A) 05/05/2021 2236   HGBUR NEGATIVE 05/05/2021 2236   BILIRUBINUR NEGATIVE 05/05/2021 2236   KETONESUR NEGATIVE 05/05/2021 2236   PROTEINUR 100 (A) 05/05/2021 2236   UROBILINOGEN 1.0 04/17/2015 1348   NITRITE NEGATIVE 05/05/2021 2236   LEUKOCYTESUR NEGATIVE 05/05/2021 2236   Sepsis Labs: Invalid input(s): PROCALCITONIN, LACTICIDVEN   Time coordinating discharge: 45 minutes  SIGNED:  Mercy Riding, MD  [facility] 05/14/2021, 10:45 AM  If 7PM-7AM, please contact night-coverage [email or web address]

## 2021-05-14 NOTE — Progress Notes (Signed)
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 Dopplers negative for new DVT.   -PTA dose Coumadin 10 mg MWF, 7.5 mg TTSS with admit INR 2.5  INR therapeutic at 2.0 today and stable.  INR has been downtrending over the past few days (2.3>2.2>2.0) but remains stable today at 2.0.   Of note, patient did miss a 7.5mg  dose on 5/21.  Goal of Therapy:  INR 2-3 Monitor platelets by anticoagulation protocol: Yes   Plan:  -Continue home warfarin 7.5mg  daily except 10mg  warfarin on MWF -Will enter x1 INR check on Sunday to make sure no further INR decrease, then back to qMWF INR -Monitor CBC, s/sx bleeding  Dimple Nanas, PharmD PGY-1 Acute Care Pharmacy Resident Office: [phone] 05/14/2021 9:44 AM   Please check AMION for all [facility] phone numbers After 10:00 PM, call [place] [phone]

## 2021-05-14 NOTE — TOC Transition Note (Addendum)
Transition of Care [facility]) - CM/SW Discharge Note   Patient Details  Name: [name] MRN: [id number] Date of Birth: 04/23/1961  Transition of Care [facility]) CM/SW Contact:  Coralee Pesa, [place] Phone Number: 05/14/2021, 10:44 AM   Clinical Narrative:    Pt to be transported to [facility] Nurse to call report to [phone] Rm# [phone]   Pt will have his fiancee transport him to rehab.   Final next level of care: Skilled Nursing Facility Barriers to Discharge: Barriers Resolved   Patient Goals and CMS Choice   CMS Medicare.gov Compare Post Acute Care list provided to:: Patient Choice offered to / list presented to : Patient  Discharge Placement              Patient chooses bed at:  [facility]) Patient to be transferred to facility by: [facility] Name of family member notified: Patient Patient and family notified of of transfer: 05/14/21  Discharge Plan and Services In-house Referral: Clinical Social Work Discharge Planning Services: CM Consult Post Acute Care Choice: [place]                               Social Determinants of Health (SDOH) Interventions     Readmission Risk Interventions No flowsheet data found.

## 2021-05-14 NOTE — Plan of Care (Signed)
  Problem: Activity: Goal: Risk for activity intolerance will decrease Outcome: Adequate for Discharge   

## 2021-05-17 ENCOUNTER — Telehealth: Payer: Self-pay | Admitting: Pharmacist

## 2021-05-17 NOTE — Telephone Encounter (Signed)
Patient called bc he missed his apt when he was in the hospital. He is now at [facility]. He wanted to make an apt with coumadin clinic. Advised that while he is in rehab they will give him his warfarin and check his INR. I did confirm that the dose on his discharge paperwork was correct. He will call us when he gets discharged to schedule coumadin clinic apt.

## 2021-05-20 ENCOUNTER — Other Ambulatory Visit: Payer: Self-pay | Admitting: Cardiology

## 2021-05-20 ENCOUNTER — Telehealth: Payer: Self-pay | Admitting: Pharmacist

## 2021-05-20 ENCOUNTER — Ambulatory Visit (INDEPENDENT_AMBULATORY_CARE_PROVIDER_SITE_OTHER): Payer: Medicare (Managed Care) | Admitting: Pharmacist

## 2021-05-20 ENCOUNTER — Other Ambulatory Visit: Payer: Self-pay

## 2021-05-20 DIAGNOSIS — I240 Acute coronary thrombosis not resulting in myocardial infarction: Secondary | ICD-10-CM | POA: Diagnosis not present

## 2021-05-20 DIAGNOSIS — I4892 Unspecified atrial flutter: Secondary | ICD-10-CM

## 2021-05-20 DIAGNOSIS — Z5181 Encounter for therapeutic drug level monitoring: Secondary | ICD-10-CM

## 2021-05-20 DIAGNOSIS — Q231 Congenital insufficiency of aortic valve: Secondary | ICD-10-CM

## 2021-05-20 DIAGNOSIS — Q2381 Bicuspid aortic valve: Secondary | ICD-10-CM

## 2021-05-20 LAB — POCT INR: INR: 1.2 — AB (ref 2.0–3.0)

## 2021-05-20 NOTE — Telephone Encounter (Signed)
Called and scheduled coumadin apt this afternoon will hold refill until seen

## 2021-05-20 NOTE — Telephone Encounter (Signed)
Anticoagulation encounter sent to [facility]

## 2021-05-20 NOTE — Progress Notes (Signed)
Patient in rehab for gout and swollen feet.  Reports he is not sure what dose of warfarin the nurses are giving him.  Has not seen any half tablets.  Unclear if rehab facility is giving him warfarin.  Printed out 2 copies of after visit summary.  One for patient and one for rehab nurses.[name]

## 2021-05-20 NOTE — Telephone Encounter (Signed)
[facility] rehab called regarding patient.  Explained to nurse that we would manage patient's warfarin and INR.  Questioned whether they were giving him his warfarin since his INR was 1.2. She reported she did not know because he is not on her cart.  Nurse requests all orders and paperwork from coumadin clinic be emailed to doctor in charge at [email or web address].  Told nurse if MD has any questions to ask for me.

## 2021-05-20 NOTE — Patient Instructions (Signed)
Description   Boost today and tomorrow with 15mg  each.  Then continue same dosage 1.5 tablets daily except for 2 tablets on Mondays, Wednesday and Fridays. Be consistent with your greens. Get INR rechecked in 1 week. [phone].

## 2021-05-23 NOTE — Telephone Encounter (Signed)
From: Bridgette Hough-Woods [email or web address]> Sent: Saturday, May 21, 2021 11:51 AM To: Karren [email or web address]> Subject: Re: secure INR management [name]  Thank Rudean [place]. our Coumadin Protocol is for evening doses. [name] is receiving his Coumadin dose that we have from his hospital discharge summary. We have also SCHEDULED  a PT/INR every Tuesday for [name]- since the communication with you that will change. [name] is a unique resident. Now that we have established that you all will be dosing him we will follow those  Coumadin orders from 05/20/2021 and beyond. Thanks    [employer], Designer, television/film set of [place]  [address] [place], [place] [address]

## 2021-05-27 ENCOUNTER — Other Ambulatory Visit: Payer: Self-pay

## 2021-05-27 ENCOUNTER — Ambulatory Visit (INDEPENDENT_AMBULATORY_CARE_PROVIDER_SITE_OTHER): Payer: Medicare (Managed Care) | Admitting: *Deleted

## 2021-05-27 DIAGNOSIS — Q231 Congenital insufficiency of aortic valve: Secondary | ICD-10-CM

## 2021-05-27 DIAGNOSIS — I240 Acute coronary thrombosis not resulting in myocardial infarction: Secondary | ICD-10-CM | POA: Diagnosis not present

## 2021-05-27 DIAGNOSIS — Z5181 Encounter for therapeutic drug level monitoring: Secondary | ICD-10-CM

## 2021-05-27 DIAGNOSIS — I4892 Unspecified atrial flutter: Secondary | ICD-10-CM | POA: Diagnosis not present

## 2021-05-27 LAB — POCT INR: INR: 1.4 — AB (ref 2.0–3.0)

## 2021-05-27 NOTE — Patient Instructions (Addendum)
Description   Emailed to [email or web address] & Spoke with Abigail LPN at [facility] and pt advised: Today and tomorrow take 3 tablets (15mg ) of Warfarin then start taking 2 tablets daily except for 1.5 tablets on Tuesdays, Thursdays, and Saturdays. Be consistent with your greens. Recheck INR 1 week. [phone].

## 2021-06-01 ENCOUNTER — Ambulatory Visit (INDEPENDENT_AMBULATORY_CARE_PROVIDER_SITE_OTHER): Payer: Medicare (Managed Care) | Admitting: *Deleted

## 2021-06-01 ENCOUNTER — Other Ambulatory Visit: Payer: Self-pay

## 2021-06-01 ENCOUNTER — Ambulatory Visit: Payer: Medicare (Managed Care) | Admitting: Nurse Practitioner

## 2021-06-01 DIAGNOSIS — Q231 Congenital insufficiency of aortic valve: Secondary | ICD-10-CM

## 2021-06-01 DIAGNOSIS — I4892 Unspecified atrial flutter: Secondary | ICD-10-CM | POA: Diagnosis not present

## 2021-06-01 DIAGNOSIS — Z5181 Encounter for therapeutic drug level monitoring: Secondary | ICD-10-CM | POA: Diagnosis not present

## 2021-06-01 DIAGNOSIS — I240 Acute coronary thrombosis not resulting in myocardial infarction: Secondary | ICD-10-CM | POA: Diagnosis not present

## 2021-06-01 LAB — POCT INR: INR: 2.3 (ref 2.0–3.0)

## 2021-06-01 NOTE — Patient Instructions (Signed)
Description   Continue taking 2 tablets daily except for 1.5 tablets on Tuesdays, Thursdays, and Saturdays. Be consistent with your greens. Recheck INR 3 weeks while out of town with new Physician. [phone].

## 2021-06-13 ENCOUNTER — Telehealth (HOSPITAL_COMMUNITY): Payer: Self-pay

## 2021-06-13 NOTE — Telephone Encounter (Signed)
Patient would like to know if he could be prescribed something for ED.

## 2021-06-15 ENCOUNTER — Telehealth (HOSPITAL_COMMUNITY): Payer: Self-pay | Admitting: *Deleted

## 2021-06-15 NOTE — Telephone Encounter (Signed)
Pt left vm for return call I called pt back no answer/left vm for return call.  

## 2021-06-16 ENCOUNTER — Telehealth (HOSPITAL_COMMUNITY): Payer: Self-pay | Admitting: *Deleted

## 2021-06-16 NOTE — Telephone Encounter (Signed)
Patient advised and verbalized understanding. He does not wish to make an appointment at this time

## 2021-06-16 NOTE — Telephone Encounter (Signed)
Records left at front desk per pts request.

## 2021-06-23 NOTE — Progress Notes (Signed)
I personally reviewed laboratory data, imaging studies and relevant notes. I independently formulated important aspects of the plan. I have edited the note to reflect any of my changes or salient points. I am in agreement with the plan/findings from the above Advanced Practitioner.

## 2021-06-23 NOTE — Addendum Note (Signed)
Encounter addended by: Larey Dresser, MD on: 06/23/2021 2:37 PM  Actions taken: Clinical Note Signed

## 2021-06-23 NOTE — Addendum Note (Signed)
Encounter addended by: Larey Dresser, MD on: 06/23/2021 2:35 PM  Actions taken: Clinical Note Signed

## 2021-06-23 NOTE — Addendum Note (Signed)
Encounter addended by: Larey Dresser, MD on: 06/23/2021 2:36 PM  Actions taken: Clinical Note Signed

## 2021-06-30 ENCOUNTER — Telehealth: Payer: Self-pay | Admitting: Internal Medicine

## 2021-06-30 DIAGNOSIS — Z952 Presence of prosthetic heart valve: Secondary | ICD-10-CM

## 2021-06-30 NOTE — Telephone Encounter (Signed)
The patient has moved to [place], [place]. Advised patient when Dr. [name] returns to office will address referral.   Patient complains about ED concerns related to medication. Will find out which or if a medication he is taking can cause this and give guidance which MD to request ED medication from.

## 2021-06-30 NOTE — Telephone Encounter (Signed)
Mr [name] has multiple risk factor for ED including cardiovascular problems, hypertension, diabetes , high cholesterol, and neuropathies.   Spironolactone may contribute to ED , but is not a new therapy for patient , was given in low dose, and is needed to management of heart failure.  Recommend to discuss ED management with new PCP, cardiologist, or urologist.

## 2021-06-30 NOTE — Telephone Encounter (Signed)
Gave patient advisement from pharmacist.  Patient verbalized understanding.

## 2021-06-30 NOTE — Telephone Encounter (Signed)
Patient os requesting to speak with Dr. Jackalyn Lombard nurse. He states he recently moved and he will need a referral for a new cardiologist.

## 2021-07-02 NOTE — Telephone Encounter (Signed)
I am not aware of doctors in that area.

## 2021-07-04 MED ORDER — AMOXICILLIN 500 MG PO TABS: ORAL_TABLET | ORAL | 3 refills | Status: DC

## 2021-07-04 NOTE — Telephone Encounter (Signed)
Advised the patient that Dr. [name] was not aware of the doctors in that area. Could check with new PCP in that area for referral.  Patient requested refill of antibiotic prior to dental work, hx of  AVR. Send in refill.

## 2021-07-04 NOTE — Addendum Note (Signed)
Addended by: Darrell Jewel on: 07/04/2021 08:55 AM   Modules accepted: Orders

## 2021-07-13 ENCOUNTER — Other Ambulatory Visit (HOSPITAL_COMMUNITY): Payer: Self-pay | Admitting: Internal Medicine

## 2021-07-14 ENCOUNTER — Telehealth: Payer: Self-pay | Admitting: Internal Medicine

## 2021-07-14 NOTE — Telephone Encounter (Signed)
Doxycycline can increase the risk of bleeding with warfarin. Patient recently moved to [place], [place].  Do not know if he is established with a new provider yet but suggest he let them know.

## 2021-07-14 NOTE — Telephone Encounter (Signed)
Will forward to Pharmacy to see if med will interfere with pt's current meds ./cy

## 2021-07-14 NOTE — Telephone Encounter (Signed)
New Message:     Patient says he have Covid. He wants if it is alright for him to take this medicine Doxycycline along with his Coumadin and other medicine?

## 2021-07-18 NOTE — Telephone Encounter (Signed)
Pt aware of recommendations and after more discussion Pt states he did not have Covid Doxycycline was for cough .Pt to contact PCP to get established with another Cardiologists /cy

## 2021-07-22 ENCOUNTER — Other Ambulatory Visit (HOSPITAL_COMMUNITY): Payer: Self-pay | Admitting: *Deleted

## 2021-08-10 ENCOUNTER — Other Ambulatory Visit: Payer: Self-pay | Admitting: Cardiology

## 2021-08-13 ENCOUNTER — Other Ambulatory Visit (HOSPITAL_COMMUNITY): Payer: Self-pay | Admitting: Internal Medicine

## 2021-08-25 ENCOUNTER — Encounter (HOSPITAL_COMMUNITY): Payer: Medicare (Managed Care)

## 2021-09-10 ENCOUNTER — Ambulatory Visit: Payer: Medicare (Managed Care)

## 2021-09-12 ENCOUNTER — Other Ambulatory Visit (HOSPITAL_COMMUNITY): Payer: Self-pay | Admitting: Internal Medicine

## 2021-10-12 ENCOUNTER — Other Ambulatory Visit (HOSPITAL_COMMUNITY): Payer: Self-pay | Admitting: Internal Medicine

## 2021-10-16 ENCOUNTER — Other Ambulatory Visit (HOSPITAL_COMMUNITY): Payer: Self-pay | Admitting: Internal Medicine

## 2021-10-24 ENCOUNTER — Other Ambulatory Visit (HOSPITAL_COMMUNITY): Payer: Self-pay | Admitting: Internal Medicine

## 2021-10-31 ENCOUNTER — Other Ambulatory Visit (HOSPITAL_COMMUNITY): Payer: Self-pay

## 2021-11-04 ENCOUNTER — Other Ambulatory Visit (HOSPITAL_COMMUNITY): Payer: Self-pay | Admitting: Cardiology

## 2021-11-04 MED ORDER — ENTRESTO 97-103 MG PO TABS: ORAL_TABLET | ORAL | 11 refills | Status: DC

## 2021-11-29 ENCOUNTER — Telehealth (HOSPITAL_COMMUNITY): Payer: Self-pay | Admitting: Licensed Clinical Social Worker

## 2021-11-29 ENCOUNTER — Telehealth (HOSPITAL_COMMUNITY): Payer: Self-pay

## 2021-11-29 NOTE — Telephone Encounter (Signed)
Received a fax requesting medical records from [facility]. Records were successfully faxed to: [phone] ,which was the number provided.. Medical request form will be scanned into patients chart.   Patient's records has been mailed per office request.

## 2021-11-29 NOTE — Telephone Encounter (Signed)
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  CSW called [employer] who needs the documents and they faxed over a release.  CSW faxed over requested documents for review.  No further needs at this time  Jorge Ny, [facility] Desk#: [phone] Cell#: [phone]

## 2021-12-01 ENCOUNTER — Telehealth (HOSPITAL_COMMUNITY): Payer: Self-pay | Admitting: Licensed Clinical Social Worker

## 2021-12-01 NOTE — Telephone Encounter (Signed)
Entered in error

## 2021-12-01 NOTE — Telephone Encounter (Signed)
Pt had requested an order for an echo be sent to [facility] which is near where pt is now living because he wanted his EF retested- it is currently a barrier to him working.  CSW spoke with clinic staff regarding this request but we are not able to accommodate- pt will need to either come back to see our provider and have echo here or establish with a provider at [facility] so they can order an echo at their center.  CSW informed pt of this.  Jorge Ny, LCSW Clinical Social Worker Advanced [facility] Failure Clinic Desk#: [phone] Cell#: [phone]

## 2021-12-09 ENCOUNTER — Other Ambulatory Visit (HOSPITAL_COMMUNITY): Payer: Self-pay | Admitting: *Deleted

## 2021-12-09 ENCOUNTER — Telehealth (HOSPITAL_COMMUNITY): Payer: Self-pay | Admitting: Pharmacy Technician

## 2021-12-09 ENCOUNTER — Other Ambulatory Visit (HOSPITAL_COMMUNITY): Payer: Self-pay

## 2021-12-09 MED ORDER — EMPAGLIFLOZIN 10 MG PO TABS: 10.0000 mg | ORAL_TABLET | Freq: Every day | ORAL | 3 refills | Status: DC

## 2021-12-09 NOTE — Telephone Encounter (Signed)
Advanced Heart Failure Patient Advocate Encounter  Spoke with patient regarding Entresto assistance renewal with [employer]. He does not think that is where the [name] is coming from. Would like to do some research on his end before filling anything else out. He is supposed to have a PAN grant that helps pay for Jardiance. Emailed him the Futures trader and asked Freight forwarder) to send a new 30 day rx to [facility] in [facility] for him. He stated that he has been out of the medication.  Charlann Boxer, CPhT

## 2022-01-23 NOTE — Progress Notes (Signed)
This encounter is erroneous and the patient canceled their appt.

## 2022-01-24 ENCOUNTER — Other Ambulatory Visit (HOSPITAL_COMMUNITY): Payer: Self-pay | Admitting: Internal Medicine

## 2022-02-01 ENCOUNTER — Other Ambulatory Visit (HOSPITAL_COMMUNITY): Payer: Self-pay | Admitting: Internal Medicine

## 2022-02-02 ENCOUNTER — Other Ambulatory Visit (HOSPITAL_COMMUNITY): Payer: Self-pay | Admitting: *Deleted

## 2022-02-02 DIAGNOSIS — I5022 Chronic systolic (congestive) heart failure: Secondary | ICD-10-CM

## 2022-02-03 ENCOUNTER — Encounter (HOSPITAL_COMMUNITY): Payer: Self-pay | Admitting: Internal Medicine

## 2022-02-03 ENCOUNTER — Ambulatory Visit (HOSPITAL_BASED_OUTPATIENT_CLINIC_OR_DEPARTMENT_OTHER)
Admission: RE | Admit: 2022-02-03 | Discharge: 2022-02-03 | Disposition: A | Discharge status: Home / Self Care | Payer: Medicare (Managed Care) | Source: Ambulatory Visit

## 2022-02-03 ENCOUNTER — Other Ambulatory Visit (HOSPITAL_COMMUNITY): Payer: Self-pay

## 2022-02-03 ENCOUNTER — Ambulatory Visit (HOSPITAL_COMMUNITY)
Admission: RE | Admit: 2022-02-03 | Discharge: 2022-02-03 | Disposition: A | Discharge status: Home / Self Care | Payer: Medicare (Managed Care) | Source: Ambulatory Visit | Attending: Internal Medicine | Admitting: Internal Medicine

## 2022-02-03 ENCOUNTER — Other Ambulatory Visit: Payer: Self-pay

## 2022-02-03 VITALS — BP 108/68 | HR 57 | Wt 266.8 lb

## 2022-02-03 DIAGNOSIS — I5022 Chronic systolic (congestive) heart failure: Secondary | ICD-10-CM

## 2022-02-03 DIAGNOSIS — I340 Nonrheumatic mitral (valve) insufficiency: Secondary | ICD-10-CM | POA: Diagnosis not present

## 2022-02-03 DIAGNOSIS — R011 Cardiac murmur, unspecified: Secondary | ICD-10-CM | POA: Diagnosis not present

## 2022-02-03 DIAGNOSIS — N1831 Chronic kidney disease, stage 3a: Secondary | ICD-10-CM | POA: Diagnosis not present

## 2022-02-03 DIAGNOSIS — I4892 Unspecified atrial flutter: Secondary | ICD-10-CM | POA: Diagnosis not present

## 2022-02-03 DIAGNOSIS — Z8701 Personal history of pneumonia (recurrent): Secondary | ICD-10-CM | POA: Diagnosis not present

## 2022-02-03 DIAGNOSIS — N182 Chronic kidney disease, stage 2 (mild): Secondary | ICD-10-CM | POA: Diagnosis not present

## 2022-02-03 DIAGNOSIS — I493 Ventricular premature depolarization: Secondary | ICD-10-CM | POA: Insufficient documentation

## 2022-02-03 DIAGNOSIS — I428 Other cardiomyopathies: Secondary | ICD-10-CM | POA: Insufficient documentation

## 2022-02-03 DIAGNOSIS — Z952 Presence of prosthetic heart valve: Secondary | ICD-10-CM | POA: Diagnosis not present

## 2022-02-03 DIAGNOSIS — Z953 Presence of xenogenic heart valve: Secondary | ICD-10-CM | POA: Diagnosis not present

## 2022-02-03 DIAGNOSIS — I459 Conduction disorder, unspecified: Secondary | ICD-10-CM | POA: Insufficient documentation

## 2022-02-03 DIAGNOSIS — Z7984 Long term (current) use of oral hypoglycemic drugs: Secondary | ICD-10-CM | POA: Insufficient documentation

## 2022-02-03 DIAGNOSIS — I482 Chronic atrial fibrillation, unspecified: Secondary | ICD-10-CM | POA: Insufficient documentation

## 2022-02-03 DIAGNOSIS — Z7901 Long term (current) use of anticoagulants: Secondary | ICD-10-CM | POA: Diagnosis not present

## 2022-02-03 DIAGNOSIS — Z79899 Other long term (current) drug therapy: Secondary | ICD-10-CM | POA: Diagnosis not present

## 2022-02-03 DIAGNOSIS — I5082 Biventricular heart failure: Secondary | ICD-10-CM | POA: Insufficient documentation

## 2022-02-03 DIAGNOSIS — I495 Sick sinus syndrome: Secondary | ICD-10-CM | POA: Diagnosis not present

## 2022-02-03 DIAGNOSIS — E785 Hyperlipidemia, unspecified: Secondary | ICD-10-CM | POA: Insufficient documentation

## 2022-02-03 DIAGNOSIS — I4819 Other persistent atrial fibrillation: Secondary | ICD-10-CM | POA: Diagnosis not present

## 2022-02-03 DIAGNOSIS — Z8719 Personal history of other diseases of the digestive system: Secondary | ICD-10-CM | POA: Diagnosis not present

## 2022-02-03 DIAGNOSIS — I272 Pulmonary hypertension, unspecified: Secondary | ICD-10-CM | POA: Diagnosis not present

## 2022-02-03 DIAGNOSIS — M109 Gout, unspecified: Secondary | ICD-10-CM | POA: Insufficient documentation

## 2022-02-03 DIAGNOSIS — E1122 Type 2 diabetes mellitus with diabetic chronic kidney disease: Secondary | ICD-10-CM | POA: Insufficient documentation

## 2022-02-03 DIAGNOSIS — I4510 Unspecified right bundle-branch block: Secondary | ICD-10-CM | POA: Diagnosis not present

## 2022-02-03 DIAGNOSIS — I130 Hypertensive heart and chronic kidney disease with heart failure and stage 1 through stage 4 chronic kidney disease, or unspecified chronic kidney disease: Secondary | ICD-10-CM | POA: Diagnosis not present

## 2022-02-03 LAB — BASIC METABOLIC PANEL
Anion gap: 10 (ref 5–15)
BUN: 30 mg/dL — ABNORMAL HIGH (ref 6–20)
CO2: 26 mmol/L (ref 22–32)
Calcium: 9.7 mg/dL (ref 8.9–10.3)
Chloride: 100 mmol/L (ref 98–111)
Creatinine, Ser: 1.84 mg/dL — ABNORMAL HIGH (ref 0.61–1.24)
GFR, Estimated: 41 mL/min — ABNORMAL LOW (ref >60)
Glucose, Bld: 99 mg/dL (ref 70–99)
Potassium: 4.6 mmol/L (ref 3.5–5.1)
Sodium: 136 mmol/L (ref 135–145)

## 2022-02-03 LAB — ECHOCARDIOGRAM COMPLETE
AR max vel: 3.35 cm2
AV Area VTI: 2.92 cm2
AV Area mean vel: 2.68 cm2
AV Mean grad: 16.6 mmHg
AV Peak grad: 23.9 mmHg
Ao pk vel: 2.45 m/s
Calc EF: 34.8 %
MV VTI: 3.07 cm2
S' Lateral: 4.75 cm
Single Plane A2C EF: 36.8 %
Single Plane A4C EF: 28.1 %

## 2022-02-03 LAB — CBC
HCT: 42.2 % (ref 39.0–52.0)
Hemoglobin: 13.7 g/dL (ref 13.0–17.0)
MCH: 27.6 pg (ref 26.0–34.0)
MCHC: 32.5 g/dL (ref 30.0–36.0)
MCV: 85.1 fL (ref 80.0–100.0)
Platelets: 221 10*3/uL (ref 150–400)
RBC: 4.96 MIL/uL (ref 4.22–5.81)
RDW: 13.7 % (ref 11.5–15.5)
WBC: 8.4 10*3/uL (ref 4.0–10.5)
nRBC: 0 % (ref 0.0–0.2)

## 2022-02-03 LAB — BRAIN NATRIURETIC PEPTIDE: B Natriuretic Peptide: 383.3 pg/mL — ABNORMAL HIGH (ref 0.0–100.0)

## 2022-02-03 MED ORDER — APIXABAN 5 MG PO TABS: 5.0000 mg | ORAL_TABLET | Freq: Two times a day (BID) | ORAL | 6 refills | Status: DC

## 2022-02-03 MED ORDER — APIXABAN 5 MG PO TABS: 5.0000 mg | ORAL_TABLET | Freq: Two times a day (BID) | ORAL | 3 refills | Status: DC

## 2022-02-03 MED ORDER — EMPAGLIFLOZIN 10 MG PO TABS: ORAL_TABLET | ORAL | 3 refills | Status: DC

## 2022-02-03 MED ORDER — ENTRESTO 97-103 MG PO TABS: ORAL_TABLET | ORAL | 3 refills | Status: DC

## 2022-02-03 NOTE — Patient Instructions (Signed)
Restart Eliquis 5 mg Twice daily   Restart Jardiance 10 mg Daily  We have provided you Fatima Sanger information to help with your copay for Jardiance and Entresto  Labs done today, we will call you with abnormal results  You have been ordered a PYP Scan.  This is done in the Radiology Department of [facility].  When you come for this test please plan to be there 2-3 hours.  Your physician recommends that you schedule a follow-up appointment in: 4 months  If you have any questions or concerns before your next appointment please send [place] a message through [place] or call our office at [phone].    TO LEAVE A MESSAGE FOR THE NURSE SELECT OPTION 2, PLEASE LEAVE A MESSAGE INCLUDING: YOUR NAME DATE OF BIRTH CALL BACK NUMBER REASON FOR CALL**this is important as we prioritize the call backs  YOU WILL RECEIVE A CALL BACK THE SAME DAY AS LONG AS YOU CALL BEFORE 4:00 PM  At the [facility], you and your health needs are our priority. As part of our continuing mission to provide you with exceptional heart care, we have created designated Provider Care Teams. These Care Teams include your primary Cardiologist (physician) and Advanced Practice Providers (APPs- Physician Assistants and Nurse Practitioners) who all work together to provide you with the care you need, when you need it.   You may see any of the following providers on your designated Care Team at your next follow up: Dr Glori Bickers Dr Haynes Kerns, NP Lyda Jester, [place] [facility] [place], [place] Audry Riles, PharmD   Please be sure to bring in all your medications bottles to every appointment.

## 2022-02-03 NOTE — Progress Notes (Signed)
Advanced Heart Failure Clinic Note   Date:  02/03/2022   ID:  DRAGON THRUSH, DOB Jun 07, 1961, MRN [id number]  Location: Home  Provider location: [facility] Type of Visit: Established patient  PCP:  Campbell, Jonathon Brooke, NP  Cardiologist:  Jonathon Him, MD Primary HF: [name]  Chief Complaint: Heart Failure follow-up   History of Present Illness:  [name] is a  61 y/o male with h/o systolic HF due to NICM, AS s/p AVR in 4/16, pulmonary HTN, chronic AF, CKD 3 and gout.   Admitted in 5/20 with respiratory failure due to HF exacerbation and pseudomonas PNA. He was treated with antibiotics and diuretics. Echo with EF 20-25% with apical clot. Also was in/out of AF/NSR. When in NSR had episodedsof heart block   Srtarted on anticoagulation, but he developed a GI bleed due to suspected stress ulcers.     Has seen Dr. Rayann Campbell in 8/20. Remains in AF. No further pauses. Not candidate for ablation or ICD at that time  Echo 9/20 EF 25% AVR stable. Moderate RV HK  Here for for routine f/u. We have not seen Campbell in 1 year. Says he feels good. Denies SOB, CP, orthopnea or PND. Has been out of many meds including warfarin, jardiance and lipitor. Trying to walk regularly and workout with weights,.   Echo today 02/03/22: EF 20-25% severe LVH. AVR ok mild AI. Moderate RV dysfunction Personally reviewed    Past Medical History:  Diagnosis Date   Acute gastric ulcer with hemorrhage    Acute respiratory failure ([place]) 05/11/2019   Aortic valve disease    a. severe AI/severe AS/bicuspid AV s/p bioprosthetic aortic valve with replacement of ascending aorta 2016   Atrial flutter (HCC)    Benign hypertensive heart and renal disease    Cardiogenic shock (HCC)    Chronic combined systolic and diastolic CHF (congestive heart failure) (HCC)    Chronic kidney disease (CKD), stage III (moderate) ([facility])    COVID-19    Diabetes mellitus ([place])    Essential hypertension 11/19/2014    Gout    Hx of colonic polyps 11/11/2019   Hyperlipidemia    Insomnia    Iron deficiency anemia    Murmur    Noncompliance    Obesity    Pseudomonas pneumonia ([place]) 05/16/2019   Pulmonary hypertension ([place])    Past Surgical History:  Procedure Laterality Date   AORTIC VALVE REPLACEMENT N/A 04/05/2015   Procedure: AORTIC VALVE REPLACEMENT (AVR) using a 4mm Edwards Aortic Magna Ease Valve ;  Surgeon: Jonathon Poot, MD;  Location: [facility];  Service: Open Heart Surgery;  Laterality: N/A;   COLONOSCOPY WITH PROPOFOL N/A 11/05/2019   Procedure: COLONOSCOPY WITH PROPOFOL;  Surgeon: [name], MD;  Location: [facility] ENDOSCOPY;  Service: Endoscopy;  Laterality: N/A;   ESOPHAGOGASTRODUODENOSCOPY (EGD) WITH PROPOFOL N/A 05/19/2019   Procedure: ESOPHAGOGASTRODUODENOSCOPY (EGD) WITH PROPOFOL;  Surgeon: [name], MD;  Location: [place];  Service: Gastroenterology;  Laterality: N/A;   HEMOSTASIS CLIP PLACEMENT  05/19/2019   Procedure: HEMOSTASIS CLIP PLACEMENT;  Surgeon: [name], MD;  Location: [facility] ENDOSCOPY;  Service: Gastroenterology;;   LEFT AND RIGHT HEART CATHETERIZATION WITH CORONARY ANGIOGRAM N/A 11/27/2014   Procedure: LEFT AND RIGHT HEART CATHETERIZATION WITH CORONARY ANGIOGRAM;  Surgeon: [name], MD;  Location: [facility] CATH LAB;  Service: Cardiovascular;  Laterality: N/A;   MAZE N/A 04/05/2015   Procedure: MAZE;  Surgeon: Jonathon Poot, MD;  Location:  [facility] OR;  Service: Open Heart Surgery;  Laterality: N/A;   POLYPECTOMY  11/05/2019   Procedure: POLYPECTOMY;  Surgeon: [name], MD;  Location: [facility] ENDOSCOPY;  Service: Endoscopy;;   REPLACEMENT ASCENDING AORTA N/A 04/05/2015   Procedure: REPLACEMENT ASCENDING AORTA with a 13mm Hemashield Platinum Graft;  Surgeon: Jonathon Poot, MD;  Location: [facility];  Service: Open Heart Surgery;  Laterality: N/A;   TEE WITHOUT CARDIOVERSION N/A 04/05/2015   Procedure: TRANSESOPHAGEAL ECHOCARDIOGRAM (TEE);  Surgeon: Jonathon Poot, MD;   Location: [place];  Service: Open Heart Surgery;  Laterality: N/A;     Current Outpatient Medications  Medication Sig Dispense Refill   amoxicillin (AMOXIL) 500 MG tablet Take 4 tablets by mouth 1 hour prior to dental work 4 tablet 3   furosemide (LASIX) 40 MG tablet Take 1 tablet (40 mg total) by mouth daily. 30 tablet 11   gabapentin (NEURONTIN) 300 MG capsule Take 1 capsule (300 mg total) by mouth 3 (three) times daily. 90 capsule 1   hydrALAZINE (APRESOLINE) 25 MG tablet TAKE ONE TABLET BY MOUTH THREE TIMES A DAY 270 tablet 2   isosorbide mononitrate (IMDUR) 30 MG 24 hr tablet Take 1 tablet (30 mg total) by mouth daily.     isosorbide mononitrate (IMDUR) 60 MG 24 hr tablet TAKE ONE TABLET BY MOUTH DAILY 30 tablet 11   sacubitril-valsartan (ENTRESTO) 97-103 MG TAKE ONE TABLET BY MOUTH TWICE A DAY. NEED APPOINTMENT 60 tablet 11   spironolactone (ALDACTONE) 25 MG tablet Take 0.5 tablets (12.5 mg total) by mouth daily. 30 tablet 0   tiZANidine (ZANAFLEX) 4 MG capsule Take 4 mg by mouth 3 (three) times daily as needed for muscle spasms.     traMADol (ULTRAM) 50 MG tablet Take by mouth every 6 (six) hours as needed.     JARDIANCE 10 MG TABS tablet TAKE ONE TABLET BY MOUTH DAILY **DISCONTINUE FARXIGA** (Patient not taking: Reported on 02/03/2022) 30 tablet 3   warfarin (COUMADIN) 5 MG tablet TAKE 1 AND 1/2 TABLETS BY MOUTH DAILY EXCEPT 2 TABLETS ON MONDAY , WEDNESDAY, AND FRIDAY OR AS DIRECTED BY ANTICOAGULATION CLINIC (Patient not taking: Reported on 02/03/2022) 55 tablet 1   No current facility-administered medications for this encounter.    Allergies:   Patient has no known allergies.   Social History:  The patient  reports that he has never smoked. He has never used smokeless tobacco. He reports current alcohol use. He reports that he does not use drugs.   Family History:  The patient's family history includes Liver disease in his cousin; Other in his mother.   ROS:  Please see the history  of present illness.   All other systems are personally reviewed and negative.   Vitals:   02/03/22 1424  BP: 108/68  Pulse: (!) 57  SpO2: 96%  Weight: 121 kg (266 lb 12.8 oz)    Exam:   General:  Well appearing. No resp difficulty HEENT: normal Neck: supple. no JVD. Carotids 2+ bilat; no bruits. No lymphadenopathy or thryomegaly appreciated. Cor: PMI nondisplaced. Irregular rate & rhythm. 2/6 SEM RUSB Lungs: clear Abdomen: soft, nontender, nondistended. No hepatosplenomegaly. No bruits or masses. Good bowel sounds. Extremities: no cyanosis, clubbing, rash, edema Neuro: alert & orientedx3, cranial nerves grossly intact. moves all 4 extremities w/o difficulty. Affect pleasant. Speech pressured  Recent Labs: 05/05/2021: B Natriuretic Peptide 1,072.3 05/11/2021: ALT 21 05/14/2021: BUN 37; Creatinine, Ser 1.58; Hemoglobin 13.3; Magnesium 2.2; Platelets 441; Potassium 4.7; Sodium 136  Personally reviewed   Wt Readings from Last 3 Encounters:  02/03/22 121 kg (266 lb 12.8 oz)  05/06/21 120 kg (264 lb 8.8 oz)  04/27/21 120.7 kg (266 lb 3.2 oz)    ECG: AF 74 bpm RBBB+ PVCs Personally reviewed  ASSESSMENT AND PLAN:  1.  Chronic systolic HF with biventricular failure - Echo 12/19 EF 50% - Echo 05/11/19 EF 20-25% with moderately decreased RV function  - Due to NICM (coronaries normal in 2016). Suspect due to probable tachy-induced CM (Was in AFL in 140s on admit) - Echo 9/20 EF 25% - Echo today 02/03/22: EF 20-25% severe LVH. AVR ok mild AI. 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 Bioprosthetic AVR in 4/16 - Reminded of SBE prophylaxis - stable on echo today  3. Persitent AFL/ AFIB with tachy-brady syndrome -  Remains in AF. Rate controlled - has failed previous Maze. Not felt to be candidate for ablation - previous ECGs had AV dissociation with accelerated junctional outpacing his sinus - Wants to switch from warfarin to Eliquis. Will order   4. CKD 3a - baseline creatinine 1.5-1.8 - labs today  6. Pulmonary HTN - Likely WHO group 2&3   5. GIB - had duodenal ulcers.  - colonoscopy 11/05/19 with Dr. Carlean Purl. 2 small polyps  Signed, Glori Bickers, MD  02/03/2022 2:34 PM  Advanced [facility] [address] Heart and [place] [address] [phone] (office) [phone] (fax)

## 2022-02-05 ENCOUNTER — Other Ambulatory Visit (HOSPITAL_COMMUNITY): Payer: Self-pay | Admitting: Internal Medicine

## 2022-02-06 LAB — IMMUNOFIXATION, URINE

## 2022-02-07 ENCOUNTER — Other Ambulatory Visit (HOSPITAL_COMMUNITY): Payer: Medicare (Managed Care)

## 2022-02-07 LAB — MULTIPLE MYELOMA PANEL, SERUM
Albumin SerPl Elph-Mcnc: 4.2 g/dL (ref 2.9–4.4)
Albumin/Glob SerPl: 1.4 (ref 0.7–1.7)
Alpha 1: 0.3 g/dL (ref 0.0–0.4)
Alpha2 Glob SerPl Elph-Mcnc: 0.8 g/dL (ref 0.4–1.0)
B-Globulin SerPl Elph-Mcnc: 1.3 g/dL (ref 0.7–1.3)
Gamma Glob SerPl Elph-Mcnc: 0.7 g/dL (ref 0.4–1.8)
Globulin, Total: 3.2 g/dL (ref 2.2–3.9)
IgA: 463 mg/dL — ABNORMAL HIGH (ref 90–386)
IgG (Immunoglobin G), Serum: 1034 mg/dL (ref 603–1613)
IgM (Immunoglobulin M), Srm: 35 mg/dL (ref 20–172)
Total Protein ELP: 7.4 g/dL (ref 6.0–8.5)

## 2022-02-10 ENCOUNTER — Telehealth (HOSPITAL_COMMUNITY): Payer: Self-pay | Admitting: Licensed Clinical Social Worker

## 2022-02-10 NOTE — Telephone Encounter (Signed)
Pt called CSW to request getting help with Eliquis.  Pt reports he can't afford it (about $130/month) at this time.  CSW left 1 month of samples at the front desk and [employer] application for assistance.  Will continue to follow and assist as needed  Jorge Ny, [facility] Desk#: [phone] Cell#: [phone]

## 2022-02-14 ENCOUNTER — Telehealth (HOSPITAL_COMMUNITY): Payer: Self-pay | Admitting: Pharmacy Technician

## 2022-02-14 NOTE — Telephone Encounter (Signed)
Advanced Heart Failure Patient Advocate Encounter  Eliquis assistance application sent to [employer] via fax. Patient did not include POI or OOP report, will initially be denied.

## 2022-02-17 ENCOUNTER — Other Ambulatory Visit (HOSPITAL_COMMUNITY): Payer: Self-pay | Admitting: Internal Medicine

## 2022-03-13 ENCOUNTER — Telehealth (HOSPITAL_COMMUNITY): Payer: Self-pay | Admitting: *Deleted

## 2022-03-13 NOTE — Telephone Encounter (Signed)
Last labs mailed to pts home as requested.  ?

## 2022-03-16 NOTE — Telephone Encounter (Signed)
Advanced Heart Failure Patient Advocate Encounter ? ?Called [employer] to check the status of the patient's application. Representative stated that the patient would need to spend $722.49 OOP in order to be approved for Eliquis assistance.  ? ?

## 2022-03-20 ENCOUNTER — Other Ambulatory Visit (HOSPITAL_COMMUNITY): Payer: Self-pay

## 2022-03-20 NOTE — Telephone Encounter (Signed)
Advanced Heart Failure Patient Advocate Encounter ? ?Called and updated patient regarding OOP. The patient was approved for a second PAN grant that will help cover the cost of Jardiance. Current grant currently has $412 remaining until 05/03. Sent 90 day RX request to [facility] ([facility]) to send to [facility] outpatient for mail. The grant information was added to [facility]. Amount awarded, $1200. Eligibility, 04/19/22-04/19/23 ? ?BIN [id number] ?PCN PANF ?ID  [id number] ?Group [id number] ? ?Sent 90 day RX request to [facility] ([place]) to send to [facility] outpatient.  ? ?Charlann Boxer, CPhT ? ?

## 2022-03-29 ENCOUNTER — Other Ambulatory Visit (HOSPITAL_COMMUNITY): Payer: Self-pay

## 2022-03-29 ENCOUNTER — Other Ambulatory Visit: Payer: Self-pay

## 2022-03-29 MED ORDER — EMPAGLIFLOZIN 10 MG PO TABS
ORAL_TABLET | ORAL | 3 refills | Status: DC
  Filled 2022-03-29 – 2022-03-30 (×3): qty 30, 30d supply, fill #0
  Filled 2022-04-21: qty 60, 60d supply, fill #1
  Filled 2022-04-24: qty 30, 30d supply, fill #1
  Filled 2022-04-24: qty 90, 90d supply, fill #1
  Filled 2022-07-25: qty 90, 90d supply, fill #2
  Filled 2022-07-31: qty 30, 30d supply, fill #2
  Filled 2022-08-30: qty 30, 30d supply, fill #3

## 2022-03-29 NOTE — Telephone Encounter (Signed)
A tier exception would not fix his issue unfortunately, he is in the donut hole (Xarelto is only $5 cheaper). A 30 day supply of Eliquis is $83.

## 2022-03-30 ENCOUNTER — Other Ambulatory Visit: Payer: Self-pay

## 2022-03-30 ENCOUNTER — Other Ambulatory Visit (HOSPITAL_COMMUNITY): Payer: Self-pay

## 2022-03-31 ENCOUNTER — Other Ambulatory Visit: Payer: Self-pay

## 2022-04-21 ENCOUNTER — Other Ambulatory Visit (HOSPITAL_COMMUNITY): Payer: Self-pay

## 2022-04-24 ENCOUNTER — Other Ambulatory Visit: Payer: Self-pay

## 2022-04-24 ENCOUNTER — Telehealth: Payer: Self-pay | Admitting: Nurse Practitioner

## 2022-04-24 NOTE — Telephone Encounter (Signed)
I called patient to schedule AWV.  Patient said he has moved. Please remove PCP. ?

## 2022-05-12 ENCOUNTER — Telehealth (HOSPITAL_COMMUNITY): Payer: Self-pay

## 2022-05-12 NOTE — Telephone Encounter (Signed)
Called to confirm/remind patient of their appointment at the [facility] on 05/16/22.   Patient reminded to bring all medications and/or complete list.  Confirmed patient has transportation. Gave directions, instructed to utilize [place] parking.  Confirmed appointment prior to ending call.

## 2022-05-16 ENCOUNTER — Ambulatory Visit (HOSPITAL_COMMUNITY)
Admission: RE | Admit: 2022-05-16 | Discharge: 2022-05-16 | Disposition: A | Discharge status: Home / Self Care | Payer: Medicare (Managed Care) | Source: Ambulatory Visit | Attending: Family Medicine | Admitting: Family Medicine

## 2022-05-16 ENCOUNTER — Encounter (HOSPITAL_COMMUNITY): Payer: Self-pay

## 2022-05-16 ENCOUNTER — Other Ambulatory Visit (HOSPITAL_COMMUNITY): Payer: Self-pay

## 2022-05-16 VITALS — BP 102/68 | HR 65 | Wt 248.6 lb

## 2022-05-16 DIAGNOSIS — Z7984 Long term (current) use of oral hypoglycemic drugs: Secondary | ICD-10-CM | POA: Insufficient documentation

## 2022-05-16 DIAGNOSIS — I4819 Other persistent atrial fibrillation: Secondary | ICD-10-CM | POA: Insufficient documentation

## 2022-05-16 DIAGNOSIS — I272 Pulmonary hypertension, unspecified: Secondary | ICD-10-CM | POA: Diagnosis not present

## 2022-05-16 DIAGNOSIS — Z953 Presence of xenogenic heart valve: Secondary | ICD-10-CM | POA: Insufficient documentation

## 2022-05-16 DIAGNOSIS — I351 Nonrheumatic aortic (valve) insufficiency: Secondary | ICD-10-CM | POA: Insufficient documentation

## 2022-05-16 DIAGNOSIS — I5022 Chronic systolic (congestive) heart failure: Secondary | ICD-10-CM | POA: Diagnosis not present

## 2022-05-16 DIAGNOSIS — N1831 Chronic kidney disease, stage 3a: Secondary | ICD-10-CM | POA: Insufficient documentation

## 2022-05-16 DIAGNOSIS — Z8719 Personal history of other diseases of the digestive system: Secondary | ICD-10-CM | POA: Insufficient documentation

## 2022-05-16 DIAGNOSIS — N182 Chronic kidney disease, stage 2 (mild): Secondary | ICD-10-CM

## 2022-05-16 DIAGNOSIS — Y838 Other surgical procedures as the cause of abnormal reaction of the patient, or of later complication, without mention of misadventure at the time of the procedure: Secondary | ICD-10-CM | POA: Insufficient documentation

## 2022-05-16 DIAGNOSIS — Z86010 Personal history of colonic polyps: Secondary | ICD-10-CM | POA: Insufficient documentation

## 2022-05-16 DIAGNOSIS — I130 Hypertensive heart and chronic kidney disease with heart failure and stage 1 through stage 4 chronic kidney disease, or unspecified chronic kidney disease: Secondary | ICD-10-CM | POA: Diagnosis present

## 2022-05-16 DIAGNOSIS — Z79899 Other long term (current) drug therapy: Secondary | ICD-10-CM | POA: Insufficient documentation

## 2022-05-16 DIAGNOSIS — I4589 Other specified conduction disorders: Secondary | ICD-10-CM | POA: Diagnosis not present

## 2022-05-16 DIAGNOSIS — I5082 Biventricular heart failure: Secondary | ICD-10-CM | POA: Insufficient documentation

## 2022-05-16 DIAGNOSIS — T82857D Stenosis of cardiac prosthetic devices, implants and grafts, subsequent encounter: Secondary | ICD-10-CM | POA: Diagnosis not present

## 2022-05-16 DIAGNOSIS — I428 Other cardiomyopathies: Secondary | ICD-10-CM | POA: Insufficient documentation

## 2022-05-16 DIAGNOSIS — Z952 Presence of prosthetic heart valve: Secondary | ICD-10-CM

## 2022-05-16 DIAGNOSIS — M109 Gout, unspecified: Secondary | ICD-10-CM | POA: Diagnosis not present

## 2022-05-16 DIAGNOSIS — I4892 Unspecified atrial flutter: Secondary | ICD-10-CM | POA: Insufficient documentation

## 2022-05-16 DIAGNOSIS — Z8711 Personal history of peptic ulcer disease: Secondary | ICD-10-CM | POA: Insufficient documentation

## 2022-05-16 LAB — BASIC METABOLIC PANEL
Anion gap: 8 (ref 5–15)
BUN: 32 mg/dL — ABNORMAL HIGH (ref 8–23)
CO2: 22 mmol/L (ref 22–32)
Calcium: 9.5 mg/dL (ref 8.9–10.3)
Chloride: 105 mmol/L (ref 98–111)
Creatinine, Ser: 1.93 mg/dL — ABNORMAL HIGH (ref 0.61–1.24)
GFR, Estimated: 39 mL/min — ABNORMAL LOW (ref >60)
Glucose, Bld: 97 mg/dL (ref 70–99)
Potassium: 4.6 mmol/L (ref 3.5–5.1)
Sodium: 135 mmol/L (ref 135–145)

## 2022-05-16 LAB — BRAIN NATRIURETIC PEPTIDE: B Natriuretic Peptide: 212.4 pg/mL — ABNORMAL HIGH (ref 0.0–100.0)

## 2022-05-16 NOTE — Patient Instructions (Addendum)
Thank you for coming in today  Labs were done today, if any labs are abnormal the clinic will call you No news is good news  RESTART Eliquis 5 mg 1 tablet twice a day  Your physician recommends that you schedule a follow-up appointment in:  3 months with Dr. Haroldine Laws  At the [facility], you and your health needs are our priority. As part of our continuing mission to provide you with exceptional heart care, we have created designated Provider Care Teams. These Care Teams include your primary Cardiologist (physician) and Advanced Practice Providers (APPs- Physician Assistants and Nurse Practitioners) who all work together to provide you with the care you need, when you need it.   You may see any of the following providers on your designated Care Team at your next follow up: Dr Glori Bickers Dr Haynes Kerns, NP Lyda Jester, [place] [facility] [place], [place] Audry Riles, PharmD   Please be sure to bring in all your medications bottles to every appointment.   If you have any questions or concerns before your next appointment please send [place] a message through [place] or call our office at [phone].    TO LEAVE A MESSAGE FOR THE NURSE SELECT OPTION 2, PLEASE LEAVE A MESSAGE INCLUDING: YOUR NAME DATE OF BIRTH CALL BACK NUMBER REASON FOR CALL**this is important as we prioritize the call backs  YOU WILL RECEIVE A CALL BACK THE SAME DAY AS LONG AS YOU CALL BEFORE 4:00 PM

## 2022-05-16 NOTE — Addendum Note (Signed)
Encounter addended by: Payton Mccallum, RN on: 05/16/2022 12:26 PM  Actions taken: Clinical Note Signed

## 2022-05-16 NOTE — Addendum Note (Signed)
Encounter addended by: Rafael Bihari, FNP on: 05/16/2022 3:39 PM  Actions taken: Clinical Note Signed

## 2022-05-16 NOTE — Progress Notes (Signed)
Gave pt 2 week supply of Eliquis 5 mg  LOT # [id number] Exp date 01/2024

## 2022-05-16 NOTE — Progress Notes (Addendum)
Advanced Heart Failure Clinic Note   Date:  05/16/2022   ID:  [name], DOB [birth year], MRN [id number]  Location: Home  Provider location: [facility] Type of Visit: Established patient  PCP:  No primary care provider on file.  Cardiologist:  [name], MD HF Cardiologist: Dr. Haroldine Laws  Chief Complaint: Heart Failure follow-up   HPI: [name] is a  61 y.o. with h/o systolic HF due to NICM, AS s/p AVR in 4/16, pulmonary HTN, chronic AF, CKD 3 and gout.   Admitted in 5/20 with respiratory failure due to HF exacerbation and pseudomonas PNA. He was treated with antibiotics and diuretics. Echo with EF 20-25% with apical clot. Also was in/out of AF/NSR. When in NSR had episodeds of heart block Started on anticoagulation, but he developed a GI bleed due to suspected stress ulcers.     Has seen Dr. [name] in 8/20. Remains in AF. No further pauses. Not candidate for ablation or ICD at that time  Echo 9/20 EF 25% AVR stable. Moderate RV HK  Echo 02/03/22: EF 20-25% severe LVH. AVR ok mild AI. Moderate RV dysfunction.  Follow up 2/23, had not been seen in a year, out of most meds. NYHA II, euvolemic, remained in rate-controlled AFL. Warfarin switched to Eliquis.  Today he returns for HF follow up. Overall feeling fine. Walking and working out in home gym (4 days/week for an hour), tries to get at least 3k steps daily. Denies palpitations, CP, dizziness, edema, or PND/Orthopnea. Appetite ok. No fever or chills. Weight at home 250 pounds. Taking all medications, but has been out of Eliquis x 2 months. Does not want ICD. 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  Cardiac Studies: - Echo (2/23): EF 20-25% severe LVH. AVR ok mild AI. Moderate RV dysfunction.  - Echo (9/20): EF 25% AVR stable. Moderate RV HK  - Echo (5/20): EF 20-25% with moderately decreased RV function   - Echo (12/19): EF  50%   Past Medical History:  Diagnosis Date   Acute gastric ulcer with hemorrhage    Acute respiratory failure ([place]) 05/11/2019   Aortic valve disease    a. severe AI/severe AS/bicuspid AV s/p bioprosthetic aortic valve with replacement of ascending aorta 2016   Atrial flutter (HCC)    Benign hypertensive heart and renal disease    Cardiogenic shock (HCC)    Chronic combined systolic and diastolic CHF (congestive heart failure) (HCC)    Chronic kidney disease (CKD), stage III (moderate) ([place])    COVID-19    Diabetes mellitus ([place])    Essential hypertension 11/19/2014   Gout    Hx of colonic polyps 11/11/2019   Hyperlipidemia    Insomnia    Iron deficiency anemia    Murmur    Noncompliance    Obesity    Pseudomonas pneumonia ([place]) 05/16/2019   Pulmonary hypertension ([place])    Past Surgical History:  Procedure Laterality Date   AORTIC VALVE REPLACEMENT N/A 04/05/2015   Procedure: AORTIC VALVE REPLACEMENT (AVR) using a 22m Edwards Aortic Magna Ease Valve ;  Surgeon: PIvin Poot MD;  [facility]  Service: Open Heart Surgery;  Laterality: N/A;   COLONOSCOPY WITH PROPOFOL N/A 11/05/2019   Procedure: COLONOSCOPY WITH PROPOFOL;  Surgeon: GGatha Mayer MD;  Location: [facility] ENDOSCOPY;  Service: Endoscopy;  Laterality: N/A;   ESOPHAGOGASTRODUODENOSCOPY (EGD) WITH PROPOFOL N/A 05/19/2019   Procedure: ESOPHAGOGASTRODUODENOSCOPY (EGD) WITH PROPOFOL;  Surgeon: [name], MD;  Location: [facility];  Service: Gastroenterology;  Laterality: N/A;   HEMOSTASIS CLIP PLACEMENT  05/19/2019   Procedure: HEMOSTASIS CLIP PLACEMENT;  Surgeon: [name], MD;  Location[facility] ENDOSCOPY;  Service: Gastroenterology;;   LEFT AND RIGHT HEART CATHETERIZATION WITH CORONARY ANGIOGRAM N/A 11/27/2014   Procedure: LEFT AND RIGHT HEART CATHETERIZATION WITH CORONARY ANGIOGRAM;  Surgeon: [name], MD;  Location: [facility];  Service: Cardiovascular;  Laterality: N/A;   MAZE N/A 04/05/2015    Procedure: MAZE;  Surgeon: [name], MD;  Location: [facility];  Service: Open Heart Surgery;  Laterality: N/A;   POLYPECTOMY  11/05/2019   Procedure: POLYPECTOMY;  Surgeon: [name], MD;  Location: [facility] ENDOS[facility];  Service: Endoscopy;;   REPLACEMENT ASCENDING AORTA N/A 04/05/2015   Procedure: REPLACEMENT ASCENDING AORTA with a 103m Hemashield Platinum Graft;  Surgeon: PIvin Poot MD;  [facility]  Service: Open Heart Surgery;  Laterality: N/A;   TEE WITHOUT CARDIOVERSION N/A 04/05/2015   Procedure: TRANSESOPHAGEAL ECHOCARDIOGRAM (TEE);  Surgeon: PIvin Poot MD;  [facility]  Service: Open Heart Surgery;  Laterality: N/A;   Current Outpatient Medications  Medication Sig Dispense Refill   empagliflozin (JARDIANCE) 10 MG TABS tablet TAKE 1 TABLET BY MOUTH DAILY **DISCONTINUE FARXIGA** 90 tablet 3   furosemide (LASIX) 40 MG tablet TAKE ONE TABLET BY MOUTH DAILY 30 tablet 11   gabapentin (NEURONTIN) 300 MG capsule Take 1 capsule (300 mg total) by mouth 3 (three) times daily. (Patient taking differently: Take 300 mg by mouth 3 (three) times daily. As needed) 90 capsule 1   hydrALAZINE (APRESOLINE) 25 MG tablet TAKE ONE TABLET BY MOUTH THREE TIMES A DAY 270 tablet 2   isosorbide mononitrate (IMDUR) 60 MG 24 hr tablet TAKE ONE TABLET BY MOUTH DAILY 30 tablet 11   sacubitril-valsartan (ENTRESTO) 97-103 MG TAKE ONE TABLET BY MOUTH TWICE A DAY. NEED APPOINTMENT 180 tablet 3   spironolactone (ALDACTONE) 25 MG tablet TAKE ONE TABLET BY MOUTH DAILY 90 tablet 3   amoxicillin (AMOXIL) 500 MG tablet Take 4 tablets by mouth 1 hour prior to dental work (Patient not taking: Reported on 05/16/2022) 4 tablet 3   No current facility-administered medications for this encounter.   Allergies:   Patient has no known allergies.   Social History:  The patient  reports that he has never smoked. He has never used smokeless tobacco. He reports current alcohol use. He reports that he does not use  drugs.   Family History:  The patient's family history includes Liver disease in his cousin; Other in his mother.   ROS:  Please see the history of present illness.   All other systems are personally reviewed and negative.   Recent Labs: 02/03/2022: B Natriuretic Peptide 383.3; BUN 30; Creatinine, Ser 1.84; Hemoglobin 13.7; Platelets 221; Potassium 4.6; Sodium 136  Personally reviewed   Wt Readings from Last 3 Encounters:  05/16/22 112.8 kg (248 lb 9.6 oz)  02/03/22 121 kg (266 lb 12.8 oz)  05/06/21 120 kg (264 lb 8.8 oz)    BP 102/68   Pulse 65   Wt 112.8 kg (248 lb 9.6 oz)   SpO2 98%   BMI 34.67 kg/m   Physical Exam General:  NAD. No resp difficulty HEENT: Normal Neck: Supple. No JVD. Carotids 2+ bilat; no bruits. No lymphadenopathy or thryomegaly appreciated. Cor: PMI nondisplaced. Regular rate & rhythm. No rubs, gallops, 2/6 SEM RUSB Lungs: Clear Abdomen: Soft,  nontender, nondistended. No hepatosplenomegaly. No bruits or masses. Good bowel sounds. Extremities: No cyanosis, clubbing, rash, edema Neuro: Alert & oriented x 3, cranial nerves grossly intact. Moves all 4 extremities w/o difficulty. Affect pleasant.  ECG (personally reviewed): AFib 60 bpm, RBBB QRS 178 msec  Assessment & Plan: 1.  Chronic systolic HF with biventricular failure - Echo (12/19): EF 50% - Echo (5/20): EF 20-25% with moderately decreased RV function  - Due to NICM (coronaries normal in 2016). Suspect due to probable tachy-induced CM (Was in AFL in 140s on admit) - Echo (9/20): EF 25% - Echo (2/23): EF 20-25% severe LVH. AVR ok mild AI. Moderate RV dysfunction  - NYHA I-II, volume looks good today. - Continue Entresto 97/103 mg bid. - Continue Lasix 40 mg daily. - Continue Jardiance 10 mg daily. - Continue Imdur 60 mg daily + hydralazine 25 mg tid. - Continue spiro 25 mg daily - No b-blocker due to bradycardia - Continues to decline ICD. - Labs today.   2. Bioprosthetic AVR in 4/16 -  Reminded of SBE prophylaxis - Stable on echo 2/23  3. Persitent AFL/ AFIB with tachy-brady syndrome - Remains in AF. Rate controlled. - Has failed previous Maze. Not felt to be candidate for ablation - Previous ECGs had AV dissociation with accelerated junctional outpacing his sinus. - Restart Eliquis.  4. CKD 3a - SCr 1.5-1.8 - Labs today.  6. Pulmonary HTN - Likely WHO group 2 & 3.   5. GIB - Had duodenal ulcers.  - Colonoscopy 11/05/19 with Dr. Carlean Purl. 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 Given a note today for his employer, no work restrictions but should be allowed days off/rest breaks as needed.   Follow up in 3-4 months with Dr. Haroldine Laws.  Frankey Poot, FNP  05/16/2022 11:45 AM  Advanced Heart Failure [place] [address] Heart and [place] [address] [phone] (office) [phone] (fax)

## 2022-06-05 ENCOUNTER — Encounter (HOSPITAL_COMMUNITY): Payer: Medicare (Managed Care)

## 2022-06-09 ENCOUNTER — Other Ambulatory Visit: Payer: Self-pay | Admitting: Cardiology

## 2022-06-16 ENCOUNTER — Telehealth (HOSPITAL_COMMUNITY): Payer: Self-pay | Admitting: Family Medicine

## 2022-06-16 NOTE — Telephone Encounter (Signed)
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 EF remains < 40%, and we are unable to provide cardiac clearance for him. I explained we could repeat his echo (last one was 2/23 showed EF 20-25%) to look for improvement in EF, but suspect EF remains down. Dr. Maylon Peppers will discuss with patient and we can arrange repeat echo if patient desires.  Allena Katz, FNP-BC

## 2022-07-25 ENCOUNTER — Other Ambulatory Visit (HOSPITAL_COMMUNITY): Payer: Self-pay

## 2022-07-26 ENCOUNTER — Other Ambulatory Visit (HOSPITAL_COMMUNITY): Payer: Self-pay

## 2022-07-27 ENCOUNTER — Other Ambulatory Visit (HOSPITAL_COMMUNITY): Payer: Self-pay

## 2022-07-31 ENCOUNTER — Other Ambulatory Visit: Payer: Self-pay

## 2022-08-23 ENCOUNTER — Other Ambulatory Visit (HOSPITAL_COMMUNITY): Payer: Self-pay

## 2022-08-25 ENCOUNTER — Encounter (HOSPITAL_COMMUNITY): Payer: Medicare (Managed Care) | Admitting: Internal Medicine

## 2022-08-30 ENCOUNTER — Other Ambulatory Visit (HOSPITAL_COMMUNITY): Payer: Self-pay | Admitting: Adult Health

## 2022-08-30 ENCOUNTER — Other Ambulatory Visit: Payer: Self-pay

## 2022-08-30 ENCOUNTER — Other Ambulatory Visit (HOSPITAL_COMMUNITY): Payer: Self-pay

## 2022-09-11 ENCOUNTER — Other Ambulatory Visit (HOSPITAL_COMMUNITY): Payer: Self-pay

## 2022-09-11 DIAGNOSIS — I5022 Chronic systolic (congestive) heart failure: Secondary | ICD-10-CM

## 2022-09-11 NOTE — Progress Notes (Signed)
Orders Placed This Encounter  Procedures   ECHOCARDIOGRAM COMPLETE    Standing Status:   Future    Standing Expiration Date:   09/12/2023    Order Specific Question:   Where should this test be performed    Answer:   [facility]    Order Specific Question:   Perflutren DEFINITY (image enhancing agent) should be administered unless hypersensitivity or allergy exist    Answer:   Administer Perflutren    Order Specific Question:   Reason for exam-Echo    Answer:   Congestive Heart Failure  I50.9    Order Specific Question:   Release to patient    Answer:   Immediate

## 2022-09-26 ENCOUNTER — Other Ambulatory Visit (HOSPITAL_COMMUNITY): Payer: Self-pay

## 2022-09-27 ENCOUNTER — Telehealth (HOSPITAL_COMMUNITY): Payer: Self-pay | Admitting: Pharmacy Technician

## 2022-09-27 ENCOUNTER — Telehealth (HOSPITAL_COMMUNITY): Payer: Self-pay | Admitting: Licensed Clinical Social Worker

## 2022-09-27 NOTE — Telephone Encounter (Signed)
Advanced Heart Failure Patient Advocate Encounter  The patient was approved for a [employer] grant that will help cover the cost of Entresto, Jardiance. Total amount awarded, $10,000. Eligibility, 08/28/22 - 08/28/23.  ID [id number]   BIN [id number]  PCN [id number]  Group [id number]  Emailed patient a copy of the grant information.  Charlann Boxer, CPhT

## 2022-09-27 NOTE — Telephone Encounter (Signed)
H&V Care Navigation CSW Progress Note  Clinical Social Worker received message from pt requesting confirmation of upcoming appt.  CSW returned pt call and informed of appt time for next week.   Pt then expressed that he is almost out of Entresto and is out of jardiance due to cost.  Pt had PAN foundation but states that pharmacy told him entresto would be over $500.  CSW checked PAN website and only $7 left on grant.  CSW discussed pt situation with patient advocate and they were able to sign pt up for grant through [employer].  Pt informed of this and provided with grant information to take to pharmacy.   SDOH Screenings   Tobacco Use: Low Risk  (05/16/2022)   Jorge Ny, LCSW Clinical Social Worker Advanced Heart Failure Clinic Desk#: [phone] Cell#: [phone]

## 2022-09-28 ENCOUNTER — Telehealth (HOSPITAL_COMMUNITY): Payer: Self-pay | Admitting: Internal Medicine

## 2022-09-28 NOTE — Telephone Encounter (Signed)
C/o of fluid retention appt scheduled 10/16

## 2022-09-28 NOTE — Telephone Encounter (Signed)
Patient reports that he just left the hospital and he's ok, he will see [place] Monday.

## 2022-10-02 ENCOUNTER — Other Ambulatory Visit (HOSPITAL_COMMUNITY): Payer: Self-pay

## 2022-10-02 ENCOUNTER — Other Ambulatory Visit: Payer: Self-pay

## 2022-10-02 ENCOUNTER — Ambulatory Visit (HOSPITAL_BASED_OUTPATIENT_CLINIC_OR_DEPARTMENT_OTHER)
Admission: RE | Admit: 2022-10-02 | Discharge: 2022-10-02 | Disposition: A | Discharge status: Home / Self Care | Payer: Medicare (Managed Care) | Source: Ambulatory Visit | Attending: Internal Medicine | Admitting: Internal Medicine

## 2022-10-02 ENCOUNTER — Ambulatory Visit (HOSPITAL_COMMUNITY)
Admission: RE | Admit: 2022-10-02 | Discharge: 2022-10-02 | Disposition: A | Discharge status: Home / Self Care | Payer: Medicare (Managed Care) | Source: Ambulatory Visit | Attending: Internal Medicine | Admitting: Internal Medicine

## 2022-10-02 VITALS — BP 120/82 | HR 72 | Wt 249.4 lb

## 2022-10-02 DIAGNOSIS — Z79899 Other long term (current) drug therapy: Secondary | ICD-10-CM | POA: Diagnosis not present

## 2022-10-02 DIAGNOSIS — I5022 Chronic systolic (congestive) heart failure: Secondary | ICD-10-CM

## 2022-10-02 DIAGNOSIS — I240 Acute coronary thrombosis not resulting in myocardial infarction: Secondary | ICD-10-CM

## 2022-10-02 DIAGNOSIS — I4891 Unspecified atrial fibrillation: Secondary | ICD-10-CM | POA: Insufficient documentation

## 2022-10-02 DIAGNOSIS — I5082 Biventricular heart failure: Secondary | ICD-10-CM | POA: Diagnosis present

## 2022-10-02 DIAGNOSIS — Z952 Presence of prosthetic heart valve: Secondary | ICD-10-CM

## 2022-10-02 DIAGNOSIS — I428 Other cardiomyopathies: Secondary | ICD-10-CM | POA: Insufficient documentation

## 2022-10-02 DIAGNOSIS — I4819 Other persistent atrial fibrillation: Secondary | ICD-10-CM

## 2022-10-02 DIAGNOSIS — I130 Hypertensive heart and chronic kidney disease with heart failure and stage 1 through stage 4 chronic kidney disease, or unspecified chronic kidney disease: Secondary | ICD-10-CM | POA: Diagnosis present

## 2022-10-02 DIAGNOSIS — E1122 Type 2 diabetes mellitus with diabetic chronic kidney disease: Secondary | ICD-10-CM | POA: Diagnosis not present

## 2022-10-02 DIAGNOSIS — N1831 Chronic kidney disease, stage 3a: Secondary | ICD-10-CM | POA: Diagnosis not present

## 2022-10-02 DIAGNOSIS — K269 Duodenal ulcer, unspecified as acute or chronic, without hemorrhage or perforation: Secondary | ICD-10-CM | POA: Insufficient documentation

## 2022-10-02 DIAGNOSIS — Z953 Presence of xenogenic heart valve: Secondary | ICD-10-CM | POA: Insufficient documentation

## 2022-10-02 DIAGNOSIS — I272 Pulmonary hypertension, unspecified: Secondary | ICD-10-CM | POA: Diagnosis not present

## 2022-10-02 DIAGNOSIS — Z8719 Personal history of other diseases of the digestive system: Secondary | ICD-10-CM

## 2022-10-02 DIAGNOSIS — Z9889 Other specified postprocedural states: Secondary | ICD-10-CM | POA: Insufficient documentation

## 2022-10-02 DIAGNOSIS — I4510 Unspecified right bundle-branch block: Secondary | ICD-10-CM | POA: Diagnosis not present

## 2022-10-02 DIAGNOSIS — I495 Sick sinus syndrome: Secondary | ICD-10-CM | POA: Diagnosis not present

## 2022-10-02 DIAGNOSIS — Z7901 Long term (current) use of anticoagulants: Secondary | ICD-10-CM | POA: Insufficient documentation

## 2022-10-02 LAB — CBC
HCT: 48.7 % (ref 39.0–52.0)
Hemoglobin: 16.1 g/dL (ref 13.0–17.0)
MCH: 27.6 pg (ref 26.0–34.0)
MCHC: 33.1 g/dL (ref 30.0–36.0)
MCV: 83.5 fL (ref 80.0–100.0)
Platelets: 289 10*3/uL (ref 150–400)
RBC: 5.83 MIL/uL — ABNORMAL HIGH (ref 4.22–5.81)
RDW: 14.2 % (ref 11.5–15.5)
WBC: 9.3 10*3/uL (ref 4.0–10.5)
nRBC: 0 % (ref 0.0–0.2)

## 2022-10-02 LAB — COMPREHENSIVE METABOLIC PANEL
ALT: 17 U/L (ref 0–44)
AST: 19 U/L (ref 15–41)
Albumin: 3.7 g/dL (ref 3.5–5.0)
Alkaline Phosphatase: 77 U/L (ref 38–126)
Anion gap: 9 (ref 5–15)
BUN: 21 mg/dL (ref 8–23)
CO2: 25 mmol/L (ref 22–32)
Calcium: 8.9 mg/dL (ref 8.9–10.3)
Chloride: 103 mmol/L (ref 98–111)
Creatinine, Ser: 1.63 mg/dL — ABNORMAL HIGH (ref 0.61–1.24)
GFR, Estimated: 48 mL/min — ABNORMAL LOW (ref >60)
Glucose, Bld: 98 mg/dL (ref 70–99)
Potassium: 4.2 mmol/L (ref 3.5–5.1)
Sodium: 137 mmol/L (ref 135–145)
Total Bilirubin: 0.9 mg/dL (ref 0.3–1.2)
Total Protein: 6.8 g/dL (ref 6.5–8.1)

## 2022-10-02 LAB — ECHOCARDIOGRAM COMPLETE
AR max vel: 1.64 cm2
AV Area VTI: 1.6 cm2
AV Area mean vel: 1.41 cm2
AV Mean grad: 28 mmHg
AV Peak grad: 42.5 mmHg
Ao pk vel: 3.26 m/s
S' Lateral: 5.8 cm

## 2022-10-02 LAB — BRAIN NATRIURETIC PEPTIDE: B Natriuretic Peptide: 627.3 pg/mL — ABNORMAL HIGH (ref 0.0–100.0)

## 2022-10-02 MED ORDER — APIXABAN 5 MG PO TABS: 5.0000 mg | ORAL_TABLET | Freq: Two times a day (BID) | ORAL | 11 refills | Status: DC

## 2022-10-02 MED ORDER — PERFLUTREN LIPID MICROSPHERE
1.0000 mL | INTRAVENOUS | Status: DC | PRN
  Administered 2022-10-02: 3 mL via INTRAVENOUS

## 2022-10-02 NOTE — Addendum Note (Signed)
Encounter addended by: Jolaine Artist, MD on: 10/02/2022 1:51 PM  Actions taken: Clinical Note Signed

## 2022-10-02 NOTE — Progress Notes (Signed)
Medication Samples have been provided to the patient.  Drug name: Eliquis       Strength: 5 mg        Qty: 4  LOT: [id number]  Exp.Date: 04/2024  Dosing instructions: Take 1 tablet Twice daily   The patient has been instructed regarding the correct time, dose, and frequency of taking this medication, including desired effects and most common side effects.   Juanita Laster Shereda Graw 1:03 PM 10/02/2022

## 2022-10-02 NOTE — Patient Instructions (Signed)
Medication Changes:  RESTART Eliquis 5 mg Twice daily (copay is $30 for 1 month supply)   To qualify for patient assistance you must pay $772 out of pocket at the pharmacy, you can have the pharmacy print a report for you, once you meet this please let us know and we can submit patient assistance forms  Lab Work:  Labs done today, your results will be available in MyChart, we will contact you for abnormal readings.  Testing/Procedures:  none  Referrals:  none  Special Instructions // Education:  Do the following things EVERYDAY: Weigh yourself in the morning before breakfast. Write it down and keep it in a log. Take your medicines as prescribed Eat low salt foods--Limit salt (sodium) to 2000 mg per day.  Stay as active as you can everyday Limit all fluids for the day to less than 2 liters   Follow-Up in: 3-4 months  At the [facility], you and your health needs are our priority. We have a designated team specialized in the treatment of Heart Failure. This Care Team includes your primary Heart Failure Specialized Cardiologist (physician), Advanced Practice Providers (APPs- Physician Assistants and Nurse Practitioners), and Pharmacist who all work together to provide you with the care you need, when you need it.   You may see any of the following providers on your designated Care Team at your next follow up:  Dr. Glori Bickers Dr. Loralie Champagne Dr. Roxana Hires, NP Lyda Jester, [place] [facility] [place], [place] Forestine Na, NP Audry Riles, PharmD   Please be sure to bring in all your medications bottles to every appointment.   Need to Contact us:  If you have any questions or concerns before your next appointment please send [place] a message through [place] or call our office at [phone].    TO LEAVE A MESSAGE FOR THE NURSE SELECT OPTION 2, PLEASE LEAVE A MESSAGE INCLUDING: YOUR NAME DATE OF BIRTH CALL BACK  NUMBER REASON FOR CALL**this is important as we prioritize the call backs  YOU WILL RECEIVE A CALL BACK THE SAME DAY AS LONG AS YOU CALL BEFORE 4:00 PM

## 2022-10-02 NOTE — Progress Notes (Addendum)
Advanced Heart Failure Clinic Note   Date:  10/02/2022   ID:  [name], DOB 04/16/1961, MRN [id number]  Location: Home  Provider location: [facility] Type of Visit: Established patient  PCP:  Sueanne Margarita, MD  Cardiologist:  [name], MD Primary HF: [place]don Chenow[place]  Chief Complaint: Heart Failure follow-up   History of Present Illness:  [name] is a  61 y/o male with h/o systolic HF due to NICM, AS s/p AVR in 4/16, pulmonary HTN, chronic AF, CKD 3 and gout.   Admitted in 5/20 with respiratory failure due to HF exacerbation and pseudomonas PNA. He was treated with antibiotics and diuretics. Echo with EF 20-25% with apical clot. Also was in/out of AF/NSR. When in NSR had episodedsof heart block   Started on anticoagulation, but he developed a GI bleed due to suspected stress ulcers.     Has seen Dr. [name] in 8/20. Remains in AF. No further pauses. Not candidate for ablation or ICD at that time  Echo 9/20 EF 25% AVR stable. Moderate RV HK   Echo 02/03/22: EF 20-25% severe LVH. AVR ok mild AI. Moderate RV dysfunction   Here for for routine f/u. Was seen last week at [facility] ER. Given IV lasix and lost about 8.5 pounds. Lasix doubled. Now feel much better. Not taking any blood thinners. Can do ADls now without to much issue. No CP. Compliant with meds.    Echo today 10/02/22: EF 20-25% + LVH RV ok. AVR ok. + LV clot Personally reviewed   Past Medical History:  Diagnosis Date   Acute gastric ulcer with hemorrhage    Acute respiratory failure ([place]) 05/11/2019   Aortic valve disease    a. severe AI/severe AS/bicuspid AV s/p bioprosthetic aortic valve with replacement of ascending aorta 2016   Atrial flutter (HCC)    Benign hypertensive heart and renal disease    Cardiogenic shock (HCC)    Chronic combined systolic and diastolic CHF (congestive heart failure) (HCC)    Chronic kidney disease (CKD), stage III (moderate) ([place])     COVID-19    Diabetes mellitus ([place])    Essential hypertension 11/19/2014   Gout    Hx of colonic polyps 11/11/2019   Hyperlipidemia    Insomnia    Iron deficiency anemia    Murmur    Noncompliance    Obesity    Pseudomonas pneumonia ([place]) 05/16/2019   Pulmonary hypertension ([place])    Past Surgical History:  Procedure Laterality Date   AORTIC VALVE REPLACEMENT N/A 04/05/2015   Procedure: AORTIC VALVE REPLACEMENT (AVR) using a 52m Edwards Aortic Magna Ease Valve ;  Surgeon: PIvin Poot MD;  [address]  Service: Open Heart Surgery;  Laterality: N/A;   COLONOSCOPY WITH PROPOFOL N/A 11/05/2019   Procedure: COLONOSCOPY WITH PROPOFOL;  Surgeon: GGatha Mayer MD;  [facility];  Service: Endoscopy;  Laterality: N/A;   ESOPHAGOGASTRODUODENOSCOPY (EGD) WITH PROPOFOL N/A 05/19/2019   Procedure: ESOPHAGOGASTRODUODENOSCOPY (EGD) WITH PROPOFOL;  Surgeon: GGatha Mayer MD;  [facility]  Service: Gastroenterology;  Laterality: N/A;   HEMOSTASIS CLIP PLACEMENT  05/19/2019   Procedure: HEMOSTASIS CLIP PLACEMENT;  Surgeon: GGatha Mayer MD;  [facility];  Service: Gastroenterology;;   LEFT AND RIGHT HEART CATHETERIZATION WITH CORONARY ANGIOGRAM N/A 11/27/2014   Procedure: LEFT AND RIGHT HEART CATHETERIZATION WITH CORONARY ANGIOGRAM;  Surgeon: TTroy Sine MD;  [facility];  Service: Cardiovascular;  Laterality: N/A;   MAZE N/A 04/05/2015   Procedure: MAZE;  Surgeon: [name], MD;  Location: [place];  Service: Open Heart Surgery;  Laterality: N/A;   POLYPECTOMY  11/05/2019   Procedure: POLYPECTOMY;  Surgeon: [name], MD;  [facility];  Service: Endoscopy;;   REPLACEMENT ASCENDING AORTA N/A 04/05/2015   Procedure: REPLACEMENT ASCENDING AORTA with a 14m Hemashield Platinum Graft;  Surgeon: PIvin Poot MD;  [facility] [facility]  Service: Open Heart Surgery;  Laterality: N/A;   TEE WITHOUT CARDIOVERSION N/A 04/05/2015    Procedure: TRANSESOPHAGEAL ECHOCARDIOGRAM (TEE);  Surgeon: PIvin Poot MD;  [place] [facility]  Service: Open Heart Surgery;  Laterality: N/A;     Current Outpatient Medications  Medication Sig Dispense Refill   empagliflozin (JARDIANCE) 10 MG TABS tablet TAKE 1 TABLET BY MOUTH DAILY **DISCONTINUE FARXIGA** 90 tablet 3   furosemide (LASIX) 40 MG tablet Take 1 tablet by mouth once daily 90 tablet 3   hydrALAZINE (APRESOLINE) 25 MG tablet TAKE ONE TABLET BY MOUTH THREE TIMES A DAY 270 tablet 2   isosorbide mononitrate (IMDUR) 60 MG 24 hr tablet Take 1 tablet by mouth once daily 90 tablet 3   sacubitril-valsartan (ENTRESTO) 97-103 MG TAKE ONE TABLET BY MOUTH TWICE A DAY. NEED APPOINTMENT 180 tablet 3   gabapentin (NEURONTIN) 300 MG capsule Take 1 capsule (300 mg total) by mouth 3 (three) times daily. (Patient not taking: Reported on 10/02/2022) 90 capsule 1   spironolactone (ALDACTONE) 25 MG tablet TAKE ONE TABLET BY MOUTH DAILY (Patient not taking: Reported on 10/02/2022) 90 tablet 3   No current facility-administered medications for this encounter.   Facility-Administered Medications Ordered in Other Encounters  Medication Dose Route Frequency Provider Last Rate Last Admin   perflutren lipid microspheres (DEFINITY) IV suspension  1-10 mL Intravenous PRN Kylin Genna, DShaune Pascal MD   3 mL at 10/02/22 1203    Allergies:   Patient has no known allergies.   Social History:  The patient  reports that he has never smoked. He has never used smokeless tobacco. He reports current alcohol use. He reports that he does not use drugs.   Family History:  The patient's family history includes Liver disease in his cousin; Other in his mother.   ROS:  Please see the history of present illness.   All other systems are personally reviewed and negative.   Vitals:   10/02/22 1215  BP: 120/82  Pulse: 72  SpO2: 97%  Weight: 113.1 kg (249 lb 6.4 oz)     Exam:    General:  Well appearing. No resp  difficulty HEENT: normal Neck: supple. no JVD. Carotids 2+ bilat; no bruits. No lymphadenopathy or thryomegaly appreciated. Cor: PMI nondisplaced. Regular rate & rhythm. No rubs, gallops or murmurs. Lungs: clear Abdomen: soft, nontender, nondistended. No hepatosplenomegaly. No bruits or masses. Good bowel sounds. Extremities: no cyanosis, clubbing, rash, edema Neuro: alert & orientedx3, cranial nerves grossly intact. moves all 4 extremities w/o difficulty. Affect pleasant  Recent Labs: 02/03/2022: Hemoglobin 13.7; Platelets 221 05/16/2022: B Natriuretic Peptide 212.4; BUN 32; Creatinine, Ser 1.93; Potassium 4.6; Sodium 135  Personally reviewed   Wt Readings from Last 3 Encounters:  10/02/22 113.1 kg (249 lb 6.4 oz)  05/16/22 112.8 kg (248 lb 9.6 oz)  02/03/22 121 kg (266 lb 12.8 oz)    ECG: AF 74 bpm RBBB+ PVCs Personally reviewed  ASSESSMENT AND PLAN:  1.  Chronic systolic HF with biventricular failure - Echo 12/19  EF 50% - Echo 05/11/19 EF 20-25% with moderately decreased RV function  - Due to NICM (coronaries normal in 2016). Suspect due to probable tachy-induced CM (Was in AFL in 140s on admit) - Echo 9/20 EF 25% - Echo 02/03/22: EF 20-25% severe LVH. AVR ok mild AI. Moderate RV dysfunction  - Echo today 10/02/22: EF 20-25% + LVH RV ok. AVR ok. + LV clot Personally reviewed - NYHA II-III Volume status improved after recent IV lasix. Now on lasix 80 daily.  - Continue Entresto 97/103 - Continue Imdur 30  - Continue hydralazine 25 tid - Continue Spiro 25 daily - Continue Jardiance 10  - Recently seen in ER for volume overload. Improved with IV lasix. Po lasix doubled. Volume status improved. - Overall NHYA II-III. Will continue alsix at 80 daily. 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 I told him I felt this was unlikely to happen. Also discussed potential need for possible advanced therapies. Consider CPX testing in near  future. Continue GDMT.    2. Bioprosthetic AVR in 4/16 - Reminded of SBE prophylaxis - stable on echo today  3. Persitent AFL/ AFIB with tachy-brady syndrome - Remains in AF. Rate controlled - has failed previous Maze. Not felt to be candidate for ablation - previous ECGs had AV dissociation with accelerated junctional outpacing his sinus - Start Eliquis  4. LV clot - seen on echo today - need to restart Eliquis   5. CKD 3a - baseline creatinine 1.5-1.8 - labs today  6. Pulmonary HTN - Likely WHO group 2&3   7. GIB - had duodenal ulcers.  - colonoscopy 11/05/19 with Dr. Carlean Purl. 2 small polyps - watch for bleeding on Eliquis  Total time spent 35 minutes. Over half that time spent discussing above.    Signed, Glori Bickers, MD  10/02/2022 12:42 PM  Advanced [facility] Failure [facility] [address] Heart and [place] [address] [phone] (office) [phone] (fax)

## 2022-10-26 ENCOUNTER — Other Ambulatory Visit (HOSPITAL_COMMUNITY): Payer: Self-pay

## 2022-10-26 MED ORDER — EMPAGLIFLOZIN 10 MG PO TABS: ORAL_TABLET | ORAL | 3 refills | Status: DC

## 2022-10-26 NOTE — Telephone Encounter (Signed)
Meds ordered this encounter  Medications   empagliflozin (JARDIANCE) 10 MG TABS tablet    Sig: TAKE 1 TABLET BY MOUTH DAILY **DISCONTINUE FARXIGA**    Dispense:  90 tablet    Refill:  3

## 2022-12-22 NOTE — Progress Notes (Incomplete)
Advanced Heart Failure Clinic Note   Date:  12/22/2022   ID:  [name], DOB [birth year], MRN [id number]  Location: Home  Provider location: [facility] Type of Visit: Established patient  PCP:  Jonathon Margarita, MD  Cardiologist:  Jonathon Him, MD Primary HF: [place]  Chief Complaint: Heart Failure follow-up   History of Present Illness:  [name] is a  62 y/o male with h/o systolic HF due to NICM, AS s/p AVR in 4/16, pulmonary HTN, chronic AF, CKD 3 and gout.   Admitted in 5/20 with respiratory failure due to HF exacerbation and pseudomonas PNA. He was treated with antibiotics and diuretics. Echo with EF 20-25% with apical clot. Also was in/out of AF/NSR. When in NSR had episodedsof heart block   Started on anticoagulation, but he developed a GI bleed due to suspected stress ulcers.     Has seen Dr. Rayann Campbell in 8/20. Remains in AF. No further pauses. Not candidate for ablation or ICD at that time  Echo 9/20 EF 25% AVR stable. Moderate RV HK   Echo 02/03/22: EF 20-25% severe LVH. AVR ok mild AI. Moderate RV dysfunction   Here for for routine f/u. Was seen last week at [facility] ER. Given IV lasix and lost about 8.5 pounds. Lasix doubled. Now feel much better. Not taking any blood thinners. Can do ADls now without to much issue. No CP. Compliant with meds.    Echo today 10/02/22: EF 20-25% + LVH RV ok. AVR ok. + LV clot Personally reviewed   Past Medical History:  Diagnosis Date   Acute gastric ulcer with hemorrhage    Acute respiratory failure ([place]) 05/11/2019   Aortic valve disease    a. severe AI/severe AS/bicuspid AV s/p bioprosthetic aortic valve with replacement of ascending aorta 2016   Atrial flutter (HCC)    Benign hypertensive heart and renal disease    Cardiogenic shock (HCC)    Chronic combined systolic and diastolic CHF (congestive heart failure) (HCC)    Chronic kidney disease (CKD), stage III (moderate) ([place])     COVID-19    Diabetes mellitus ([place])    Essential hypertension 11/19/2014   Gout    Hx of colonic polyps 11/11/2019   Hyperlipidemia    Insomnia    Iron deficiency anemia    Murmur    Noncompliance    Obesity    Pseudomonas pneumonia ([place]) 05/16/2019   Pulmonary hypertension ([place])    Past Surgical History:  Procedure Laterality Date   AORTIC VALVE REPLACEMENT N/A 04/05/2015   Procedure: AORTIC VALVE REPLACEMENT (AVR) using a 51m Edwards Aortic Magna Ease Valve ;  Surgeon: Jonathon Poot MD;  [place]  Service: Open Heart Surgery;  Laterality: N/A;   COLONOSCOPY WITH PROPOFOL N/A 11/05/2019   Procedure: COLONOSCOPY WITH PROPOFOL;  Surgeon: GGatha Mayer MD;  [facility];  Service: Endoscopy;  Laterality: N/A;   ESOPHAGOGASTRODUODENOSCOPY (EGD) WITH PROPOFOL N/A 05/19/2019   Procedure: ESOPHAGOGASTRODUODENOSCOPY (EGD) WITH PROPOFOL;  Surgeon: GGatha Mayer MD;  [place] [facility]  Service: Gastroenterology;  Laterality: N/A;   HEMOSTASIS CLIP PLACEMENT  05/19/2019   Procedure: HEMOSTASIS CLIP PLACEMENT;  Surgeon: GGatha Mayer MD;  [facility];  Service: Gastroenterology;;   LEFT AND RIGHT HEART CATHETERIZATION WITH CORONARY ANGIOGRAM N/A 11/27/2014   Procedure: LEFT AND RIGHT HEART CATHETERIZATION WITH CORONARY ANGIOGRAM;  Surgeon: TTroy Sine MD;  [facility];  Service: Cardiovascular;  Laterality: N/A;   MAZE N/A 04/05/2015   Procedure: MAZE;  Surgeon: Jonathon Poot, MD;  Location: [place];  Service: Open Heart Surgery;  Laterality: N/A;   POLYPECTOMY  11/05/2019   Procedure: POLYPECTOMY;  Surgeon: Jonathon Mayer, MD;  [facility];  Service: Endoscopy;;   REPLACEMENT ASCENDING AORTA N/A 04/05/2015   Procedure: REPLACEMENT ASCENDING AORTA with a 95m Hemashield Platinum Graft;  Surgeon: Jonathon Poot MD;  [facility]  Service: Open Heart Surgery;  Laterality: N/A;   TEE WITHOUT CARDIOVERSION N/A 04/05/2015    Procedure: TRANSESOPHAGEAL ECHOCARDIOGRAM (TEE);  Surgeon: Jonathon Poot MD;  [place] [facility]  Service: Open Heart Surgery;  Laterality: N/A;     Current Outpatient Medications  Medication Sig Dispense Refill   apixaban (ELIQUIS) 5 MG TABS tablet Take 1 tablet (5 mg total) by mouth 2 (two) times daily. 60 tablet 11   empagliflozin (JARDIANCE) 10 MG TABS tablet TAKE 1 TABLET BY MOUTH DAILY **DISCONTINUE FARXIGA** 90 tablet 3   furosemide (LASIX) 40 MG tablet Take 80 mg by mouth daily.     gabapentin (NEURONTIN) 300 MG capsule Take 1 capsule (300 mg total) by mouth 3 (three) times daily. (Patient not taking: Reported on 10/02/2022) 90 capsule 1   hydrALAZINE (APRESOLINE) 25 MG tablet TAKE ONE TABLET BY MOUTH THREE TIMES A DAY 270 tablet 2   isosorbide mononitrate (IMDUR) 60 MG 24 hr tablet Take 1 tablet by mouth once daily 90 tablet 3   sacubitril-valsartan (ENTRESTO) 97-103 MG TAKE ONE TABLET BY MOUTH TWICE A DAY. NEED APPOINTMENT 180 tablet 3   spironolactone (ALDACTONE) 25 MG tablet TAKE ONE TABLET BY MOUTH DAILY (Patient not taking: Reported on 10/02/2022) 90 tablet 3   No current facility-administered medications for this visit.    Allergies:   Patient has no known allergies.   Social History:  The patient  reports that he has never smoked. He has never used smokeless tobacco. He reports current alcohol use. He reports that he does not use drugs.   Family History:  The patient's family history includes Liver disease in his cousin; Other in his mother.   ROS:  Please see the history of present illness.   All other systems are personally reviewed and negative.   There were no vitals filed for this visit.    Exam:    General:  Well appearing. No resp difficulty HEENT: normal Neck: supple. no JVD. Carotids 2+ bilat; no bruits. No lymphadenopathy or thryomegaly appreciated. Cor: PMI nondisplaced. Regular rate & rhythm. No rubs, gallops or murmurs. Lungs: clear Abdomen: soft,  nontender, nondistended. No hepatosplenomegaly. No bruits or masses. Good bowel sounds. Extremities: no cyanosis, clubbing, rash, edema Neuro: alert & orientedx3, cranial nerves grossly intact. moves all 4 extremities w/o difficulty. Affect pleasant  Recent Labs: 10/02/2022: ALT 17; B Natriuretic Peptide 627.3; BUN 21; Creatinine, Ser 1.63; Hemoglobin 16.1; Platelets 289; Potassium 4.2; Sodium 137  Personally reviewed   Wt Readings from Last 3 Encounters:  10/02/22 113.1 kg (249 lb 6.4 oz)  05/16/22 112.8 kg (248 lb 9.6 oz)  02/03/22 121 kg (266 lb 12.8 oz)    ECG: AF 74 bpm RBBB+ PVCs Personally reviewed  ASSESSMENT AND PLAN:  1.  Chronic systolic HF with biventricular failure - Echo 12/19 EF 50% - Echo 05/11/19 EF 20-25% with moderately decreased RV function  - Due to NICM (coronaries normal in 2016). Suspect due to probable tachy-induced CM (Was in AFL in 140s on admit) -  Echo 9/20 EF 25% - Echo 02/03/22: EF 20-25% severe LVH. AVR ok mild AI. Moderate RV dysfunction  - Echo today 10/02/22: EF 20-25% + LVH RV ok. AVR ok. + LV clot Personally reviewed - NYHA II-III Volume status improved after recent IV lasix. Now on lasix 80 daily.  - Continue Entresto 97/103 - Continue Imdur 30  - Continue hydralazine 25 tid - Continue Spiro 25 daily - Continue Jardiance 10  - Recently seen in ER for volume overload. Improved with IV lasix. Po lasix doubled. Volume status improved. - Overall NHYA II-III. Will continue alsix at 80 daily. 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 I told Campbell I felt this was unlikely to happen. Also discussed potential need for possible advanced therapies. Consider CPX testing in near future. Continue GDMT.    2. Bioprosthetic AVR in 4/16 - Reminded of SBE prophylaxis - stable on echo today  3. Persitent AFL/ AFIB with tachy-brady syndrome - Remains in AF. Rate controlled - has failed previous Maze. Not felt  to be candidate for ablation - previous ECGs had AV dissociation with accelerated junctional outpacing his sinus - Start Eliquis  4. LV clot - seen on echo today - need to restart Eliquis   5. CKD 3a - baseline creatinine 1.5-1.8 - labs today  6. Pulmonary HTN - Likely WHO group 2&3   7. GIB - had duodenal ulcers.  - colonoscopy 11/05/19 with Dr. Carlean Purl. 2 small polyps - watch for bleeding on Eliquis  Total time spent 35 minutes. Over half that time spent discussing above.    Frankey Poot, FNP  12/22/2022 9:17 AM  Advanced Heart Failure [facility] [address] Heart and [place] [address] [phone] (office) [phone] (fax)

## 2022-12-25 ENCOUNTER — Encounter (HOSPITAL_COMMUNITY): Payer: Medicare (Managed Care)

## 2023-01-02 ENCOUNTER — Other Ambulatory Visit (HOSPITAL_COMMUNITY): Payer: Self-pay

## 2023-01-02 ENCOUNTER — Encounter (HOSPITAL_COMMUNITY): Payer: Self-pay

## 2023-01-02 ENCOUNTER — Ambulatory Visit (HOSPITAL_COMMUNITY)
Admission: RE | Admit: 2023-01-02 | Discharge: 2023-01-02 | Disposition: A | Discharge status: Home / Self Care | Payer: Medicare (Managed Care) | Source: Ambulatory Visit | Attending: Family Medicine | Admitting: Family Medicine

## 2023-01-02 VITALS — BP 100/60 | HR 84 | Ht 72.0 in | Wt 242.0 lb

## 2023-01-02 DIAGNOSIS — Z7984 Long term (current) use of oral hypoglycemic drugs: Secondary | ICD-10-CM | POA: Insufficient documentation

## 2023-01-02 DIAGNOSIS — I272 Pulmonary hypertension, unspecified: Secondary | ICD-10-CM | POA: Diagnosis not present

## 2023-01-02 DIAGNOSIS — I5022 Chronic systolic (congestive) heart failure: Secondary | ICD-10-CM | POA: Insufficient documentation

## 2023-01-02 DIAGNOSIS — I428 Other cardiomyopathies: Secondary | ICD-10-CM | POA: Insufficient documentation

## 2023-01-02 DIAGNOSIS — E1122 Type 2 diabetes mellitus with diabetic chronic kidney disease: Secondary | ICD-10-CM | POA: Insufficient documentation

## 2023-01-02 DIAGNOSIS — I495 Sick sinus syndrome: Secondary | ICD-10-CM | POA: Diagnosis not present

## 2023-01-02 DIAGNOSIS — Z79899 Other long term (current) drug therapy: Secondary | ICD-10-CM | POA: Diagnosis not present

## 2023-01-02 DIAGNOSIS — I4892 Unspecified atrial flutter: Secondary | ICD-10-CM | POA: Diagnosis not present

## 2023-01-02 DIAGNOSIS — I4891 Unspecified atrial fibrillation: Secondary | ICD-10-CM | POA: Insufficient documentation

## 2023-01-02 DIAGNOSIS — I513 Intracardiac thrombosis, not elsewhere classified: Secondary | ICD-10-CM | POA: Diagnosis not present

## 2023-01-02 DIAGNOSIS — Z7901 Long term (current) use of anticoagulants: Secondary | ICD-10-CM | POA: Insufficient documentation

## 2023-01-02 DIAGNOSIS — Z953 Presence of xenogenic heart valve: Secondary | ICD-10-CM | POA: Diagnosis not present

## 2023-01-02 DIAGNOSIS — Z952 Presence of prosthetic heart valve: Secondary | ICD-10-CM

## 2023-01-02 DIAGNOSIS — I130 Hypertensive heart and chronic kidney disease with heart failure and stage 1 through stage 4 chronic kidney disease, or unspecified chronic kidney disease: Secondary | ICD-10-CM | POA: Insufficient documentation

## 2023-01-02 DIAGNOSIS — I2510 Atherosclerotic heart disease of native coronary artery without angina pectoris: Secondary | ICD-10-CM | POA: Insufficient documentation

## 2023-01-02 DIAGNOSIS — I5082 Biventricular heart failure: Secondary | ICD-10-CM | POA: Diagnosis not present

## 2023-01-02 DIAGNOSIS — N1831 Chronic kidney disease, stage 3a: Secondary | ICD-10-CM | POA: Insufficient documentation

## 2023-01-02 DIAGNOSIS — Z8719 Personal history of other diseases of the digestive system: Secondary | ICD-10-CM

## 2023-01-02 DIAGNOSIS — Z8616 Personal history of COVID-19: Secondary | ICD-10-CM | POA: Diagnosis not present

## 2023-01-02 LAB — BASIC METABOLIC PANEL
Anion gap: 10 (ref 5–15)
BUN: 25 mg/dL — ABNORMAL HIGH (ref 8–23)
CO2: 25 mmol/L (ref 22–32)
Calcium: 8.8 mg/dL — ABNORMAL LOW (ref 8.9–10.3)
Chloride: 103 mmol/L (ref 98–111)
Creatinine, Ser: 1.62 mg/dL — ABNORMAL HIGH (ref 0.61–1.24)
GFR, Estimated: 48 mL/min — ABNORMAL LOW (ref >60)
Glucose, Bld: 131 mg/dL — ABNORMAL HIGH (ref 70–99)
Potassium: 4.3 mmol/L (ref 3.5–5.1)
Sodium: 138 mmol/L (ref 135–145)

## 2023-01-02 LAB — BRAIN NATRIURETIC PEPTIDE: B Natriuretic Peptide: 373.3 pg/mL — ABNORMAL HIGH (ref 0.0–100.0)

## 2023-01-02 MED ORDER — ENTRESTO 97-103 MG PO TABS: ORAL_TABLET | ORAL | 3 refills | Status: AC

## 2023-01-02 MED ORDER — FUROSEMIDE 40 MG PO TABS: 80.0000 mg | ORAL_TABLET | Freq: Every day | ORAL | 8 refills | Status: AC

## 2023-01-02 MED ORDER — HYDRALAZINE HCL 25 MG PO TABS: 25.0000 mg | ORAL_TABLET | Freq: Three times a day (TID) | ORAL | 2 refills | Status: AC

## 2023-01-02 MED ORDER — EMPAGLIFLOZIN 10 MG PO TABS: 10.0000 mg | ORAL_TABLET | Freq: Every day | ORAL | 3 refills | Status: AC

## 2023-01-02 MED ORDER — APIXABAN 5 MG PO TABS: 5.0000 mg | ORAL_TABLET | Freq: Two times a day (BID) | ORAL | 11 refills | Status: AC

## 2023-01-02 NOTE — Patient Instructions (Addendum)
Thank you for coming in today   Labs were done today, if any labs are abnormal the clinic will call you No news is good news  EKG done today  STAY OFF Spironolactone RESTART Eliquis 5 mg 1 tablet twice daily   Your physician recommends that you schedule a follow-up appointment in:  2 months with echocardiogram with Dr. Haroldine Laws  And you will be scheduled for CPX test it has been noted to attempt to get them on the same day if possible related to your drive to get to the clinic   Your physician has requested that you have an echocardiogram. Echocardiography is a painless test that uses sound waves to create images of your heart. It provides your doctor with information about the size and shape of your heart and how well your heart's chambers and valves are working. This procedure takes approximately one hour. There are no restrictions for this procedure.     You have been referred to the device clinic here and will be contacted for proper follow up details from their office      Do the following things EVERYDAY: Weigh yourself in the morning before breakfast. Write it down and keep it in a log. Take your medicines as prescribed Eat low salt foods--Limit salt (sodium) to 2000 mg per day.  Stay as active as you can everyday Limit all fluids for the day to less than 2 liters  At the [facility], you and your health needs are our priority. As part of our continuing mission to provide you with exceptional heart care, we have created designated Provider Care Teams. These Care Teams include your primary Cardiologist (physician) and Advanced Practice Providers (APPs- Physician Assistants and Nurse Practitioners) who all work together to provide you with the care you need, when you need it.   You may see any of the following providers on your designated Care Team at your next follow up: Dr Glori Bickers Dr Loralie Champagne Dr. Roxana Hires, NP Lyda Jester, [place] [facility] [place], [place] Forestine Na, NP Audry Riles, PharmD   Please be sure to bring in all your medications bottles to every appointment.    If you have any questions or concerns before your next appointment please send [place] a message through [place] or call our office at [phone].    TO LEAVE A MESSAGE FOR THE NURSE SELECT OPTION 2, PLEASE LEAVE A MESSAGE INCLUDING: YOUR NAME DATE OF BIRTH CALL BACK NUMBER REASON FOR CALL**this is important as we prioritize the call backs  YOU WILL RECEIVE A CALL BACK THE SAME DAY AS LONG AS YOU CALL BEFORE 4:00 PM

## 2023-01-02 NOTE — Progress Notes (Signed)
Advanced Heart Failure Clinic Note   Date:  01/02/2023   ID:  [name], DOB [birth year], MRN [id number]  Location: Home  Provider location: [facility] Type of Visit: Established patient  PCP:  Jonathon Margarita, MD  Primary Cardiologist:  Jonathon Him, MD HF Cardiologist: Dr. Haroldine Campbell  Chief Complaint: Heart Failure follow-up   HPI: [name] is a 62 y.o. male with h/o systolic HF due to NICM, AS s/p AVR in 4/16, pulmonary HTN, chronic AF, CKD 3 and gout.   Admitted in 5/20 with respiratory failure due to HF exacerbation and pseudomonas PNA. He was treated with antibiotics and diuretics. Echo with EF 20-25% with apical clot. Also was in/out of AF/NSR. When in NSR had episodedsof heart block   Started on anticoagulation, but he developed a GI bleed due to suspected stress ulcers.     Has seen Dr. Rayann Campbell in 8/20. Remains in AF. No further pauses. Not candidate for ablation or ICD at that time  Echo 9/20 EF 25% AVR stable. Moderate RV HK   Echo 02/03/22: EF 20-25% severe LVH. AVR ok mild AI. Moderate RV dysfunction   Seen at [facility] for a/c HF, given IV lasix and down 8.5 lbs. Follow up 10/23 in AHF, off AC and Eliquis restarted. Echo here showed 10/02/22: EF 20-25% + LVH RV ok. AVR ok. + LV clot.  Admitted to [facility] 11/23 with syncope. ECG showed AF with slow VR. Echo with EF 20%, mild-mod LVH, WMA (apex), severe LAE. Aortic valve with trace to mild prosthetic AI. PASP 26. LV 7.1 cm. He underwent LHC 10/24/2022 showing pRCA 35%, moderate diffuse disease throughout. Emboli of LM, LAD, Lcx . He refused cMRI. Eventually agreed to ICD and underwent placement of BiV ICD. Post hospital follow up with Cards at [facility], planned to repeat echo to look for improved EF after CRT-D placement.  Today he returns for HF follow up. Overall feeling fine. 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 No SOB with work duties or work outs. No  further syncope. Denies palpitations, abnormal bleeding, CP, dizziness, edema, or PND/Orthopnea. Appetite ok. No fever or chills. Weight at home 232 pounds. Been out of Eliquis x 2-3 months. 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 Lives 1 hr 45 mins away, has a son in [place].  He is not planning on returning to [place] and wants to keep his heart care here.  Past Medical History:  Diagnosis Date   Acute gastric ulcer with hemorrhage    Acute respiratory failure ([place]) 05/11/2019   Aortic valve disease    a. severe AI/severe AS/bicuspid AV s/p bioprosthetic aortic valve with replacement of ascending aorta 2016   Atrial flutter (HCC)    Benign hypertensive heart and renal disease    Cardiogenic shock (HCC)    Chronic combined systolic and diastolic CHF (congestive heart failure) (HCC)    Chronic kidney disease (CKD), stage III (moderate) ([place])    COVID-19    Diabetes mellitus ([place])    Essential hypertension 11/19/2014   Gout    Hx of colonic polyps 11/11/2019   Hyperlipidemia    Insomnia    Iron deficiency anemia    Murmur    Noncompliance    Obesity    Pseudomonas pneumonia ([place]) 05/16/2019   Pulmonary hypertension ([place])    Past Surgical History:  Procedure Laterality Date   AORTIC VALVE REPLACEMENT N/A 04/05/2015  Procedure: AORTIC VALVE REPLACEMENT (AVR) using a 20m Edwards Aortic Magna Ease Valve ;  Surgeon: Jonathon Poot MD;  [facility]  Service: Open Heart Surgery;  Laterality: N/A;   COLONOSCOPY WITH PROPOFOL N/A 11/05/2019   Procedure: COLONOSCOPY WITH PROPOFOL;  Surgeon: Jonathon Mayer MD;  [facility];  Service: Endoscopy;  Laterality: N/A;   ESOPHAGOGASTRODUODENOSCOPY (EGD) WITH PROPOFOL N/A 05/19/2019   Procedure: ESOPHAGOGASTRODUODENOSCOPY (EGD) WITH PROPOFOL;  Surgeon: Jonathon Mayer MD;  [place] [facility]  Service: Gastroenterology;  Laterality: N/A;   HEMOSTASIS CLIP PLACEMENT  05/19/2019   Procedure: HEMOSTASIS CLIP PLACEMENT;  Surgeon: Jonathon Mayer MD;  [facility];  Service: Gastroenterology;;   LEFT AND RIGHT HEART CATHETERIZATION WITH CORONARY ANGIOGRAM N/A 11/27/2014   Procedure: LEFT AND RIGHT HEART CATHETERIZATION WITH CORONARY ANGIOGRAM;  Surgeon: Jonathon Sine MD;  [facility];  Service: Cardiovascular;  Laterality: N/A;   MAZE N/A 04/05/2015   Procedure: MAZE;  Surgeon: Jonathon Poot MD;  [place] [facility]  Service: Open Heart Surgery;  Laterality: N/A;   POLYPECTOMY  11/05/2019   Procedure: POLYPECTOMY;  Surgeon: Jonathon Mayer MD;  [facility];  Service: Endoscopy;;   REPLACEMENT ASCENDING AORTA N/A 04/05/2015   Procedure: REPLACEMENT ASCENDING AORTA with a 341mHemashield Platinum Graft;  Surgeon: PeIvin PootMD;  [facility] Service: Open Heart Surgery;  Laterality: N/A;   TEE WITHOUT CARDIOVERSION N/A 04/05/2015   Procedure: TRANSESOPHAGEAL ECHOCARDIOGRAM (TEE);  Surgeon: PeIvin PootMD;  [facility] Service: Open Heart Surgery;  Laterality: N/A;   Current Outpatient Medications  Medication Sig Dispense Refill   apixaban (ELIQUIS) 5 MG TABS tablet Take 1 tablet (5 mg total) by mouth 2 (two) times daily. 60 tablet 11   empagliflozin (JARDIANCE) 10 MG TABS tablet TAKE 1 TABLET BY MOUTH DAILY **DISCONTINUE FARXIGA** 90 tablet 3   furosemide (LASIX) 40 MG tablet Take 80 mg by mouth daily.     gabapentin (NEURONTIN) 300 MG capsule Take 1 capsule (300 mg total) by mouth 3 (three) times daily. 90 capsule 1   hydrALAZINE (APRESOLINE) 25 MG tablet TAKE ONE TABLET BY MOUTH THREE TIMES A DAY 270 tablet 2   isosorbide mononitrate (IMDUR) 60 MG 24 hr tablet Take 1 tablet by mouth once daily 90 tablet 3   rosuvastatin (CRESTOR) 20 MG tablet Take 20 mg by mouth daily.     sacubitril-valsartan (ENTRESTO) 97-103 MG TAKE ONE TABLET BY MOUTH TWICE A DAY. NEED APPOINTMENT 180 tablet 3   spironolactone (ALDACTONE) 25 MG tablet TAKE ONE TABLET BY MOUTH DAILY (Patient not taking:  Reported on 01/02/2023) 90 tablet 3   No current facility-administered medications for this encounter.    Allergies:   Patient has no known allergies.   Social History:  The patient  reports that he has never smoked. He has never used smokeless tobacco. He reports current alcohol use. He reports that he does not use drugs.   Family History:  The patient's family history includes Liver disease in his cousin; Other in his mother.   ROS:  Please see the history of present illness.   All other systems are personally reviewed and negative.   Recent Labs: 10/02/2022: ALT 17; B Natriuretic Peptide 627.3; BUN 21; Creatinine, Ser 1.63; Hemoglobin 16.1; Platelets 289; Potassium 4.2; Sodium 137  Personally reviewed   Wt Readings from Last 3 Encounters:  01/02/23 109.8 kg (242 lb)  10/02/22 113.1 kg (249  lb 6.4 oz)  05/16/22 112.8 kg (248 lb 9.6 oz)   BP 100/60   Pulse 84   Ht 6' (1.829 m)   Wt 109.8 kg (242 lb)   SpO2 94%   BMI 32.82 kg/m   Physical Exam General:  NAD. No resp difficulty, walked into clinic HEENT: Normal Neck: Supple. No JVD. Carotids 2+ bilat; no bruits. No lymphadenopathy or thryomegaly appreciated. Cor: PMI nondisplaced. Regular rate & rhythm. No rubs, gallops or murmurs. Lungs: Clear Abdomen: Soft, nontender, nondistended. No hepatosplenomegaly. No bruits or masses. Good bowel sounds. Extremities: No cyanosis, clubbing, rash, edema Neuro: Alert & oriented x 3, cranial nerves grossly intact. Moves all 4 extremities w/o difficulty. Affect pleasant.   ECG:  AFL 74 bpm (personally reviewed)  MDT interrogation: unable to interrogate device in clinic, will have Campbell followed by device clinic here.  ASSESSMENT AND PLAN: 1.  Chronic systolic HF with biventricular failure - Echo 12/19 EF 50% - Echo 05/11/19 EF 20-25% with moderately decreased RV function  - Due to NICM (coronaries normal in 2016). Suspect due to probable tachy-induced CM (Was in AFL in 140s on admit) -  Echo 9/20 EF 25% - Echo 02/03/22: EF 20-25% severe LVH. AVR ok mild AI. Moderate RV dysfunction  - Echo 10/02/22: EF 20-25% + LVH RV ok. AVR ok. + LV clot  - Echo (11/23 at [place]): EF 20% - LHC (11/23 at [facility]) showing pRCA 35%, moderate diffuse disease throughout. Emboli of LM, LAD, Lcx . - now s/p BiV ICD. Will need to be followed by our device clinic - NYHA II today, volume looks good. Weight down 8 lbs. - Continue Lasix 80 mg daily. - Continue Entresto 97/103 mg bid. - Continue hydralazine 25 mg tid + Imdur 60 mg daily. - Continue Jardiance 10 mg daily. - Off spiro with elevated SCr. No BP room to add back today.  - Off beta blocker with concerns for low output during last admission. No BP room to add back today. - Repeat echo to see if any improvement in EF with CRT. He is not ready to talk about advanced therapies but is agreeable to CPX to get a sense of functional status. Discussed importance of demonstrating better compliance with follow ups and medications before further discussions about advanced therapies. - Labs today.    2. Bioprosthetic AVR in 4/16 - Reminded of SBE prophylaxis - Stable on echos 10/23 and 11/23  3. Persitent AFL/ AFIB with tachy-brady syndrome - Has failed previous [facility]. Not felt to be candidate for ablation - previous ECGs had AV dissociation with accelerated junctional outpacing his sinus - Remains in AF. Rate controlled. - Restart Eliquis 5 mg bid. - Sleep study has been ordered but he never completed.  4. LV clot - Seen on echo 10/23, not seen on echo 11/23. - Restart Eliquis as above.  5. CAD - LHC 2016: normal cors - LHC (11/23) with pRCA 35%, moderate diffuse disease throughout. Emboli of LM, LAD, Lcx . - No chest pain - Continue statin. - Plan to add back beta blocker when able. - No AC with Eliquis  6. CKD 3a - Baseline creatinine 1.5-1.8 - Continue SGLT2i - Labs today.  7. Pulmonary HTN - Likely WHO group 2&3   8. GIB - Had  duodenal ulcers.  - Colonoscopy 11/05/19 with Dr. Carlean Purl. 2 small polyps - No bleeding issues. - Restart Eliquis as above, watch for bleeding.  Follow up in 2 months with Dr. Haroldine Campbell + echo.  Will get CPX to get a sense of functional limitation. He lives [place] 45 mins away, will try to arrange echo and CPX on same day.  Frankey Poot, FNP  01/02/2023 11:53 AM  Advanced Heart Failure [place] and [place] [address] [phone] (office) [phone] (fax)

## 2023-01-05 ENCOUNTER — Telehealth (HOSPITAL_COMMUNITY): Payer: Self-pay

## 2023-01-05 NOTE — Telephone Encounter (Signed)
Called pt to discuss getting labs drawn in his local area. No answer left voicemail to return call

## 2023-01-09 ENCOUNTER — Telehealth (HOSPITAL_COMMUNITY): Payer: Self-pay

## 2023-01-09 NOTE — Telephone Encounter (Signed)
Dr. Haroldine Laws got message for [place] in regards to pt. Device and his rhythms.  Called patient said he felt funny around 9pm  on 01/09/23, but is passed in a few minutes. States he feels good today, no SOB, CP or dizziness with daily activities. Scheduled follow up with patient per Dr. Haroldine Laws.

## 2023-01-11 ENCOUNTER — Encounter (HOSPITAL_COMMUNITY): Payer: Medicare (Managed Care)

## 2023-01-19 ENCOUNTER — Encounter (HOSPITAL_COMMUNITY): Payer: Self-pay | Admitting: Internal Medicine

## 2023-01-19 ENCOUNTER — Ambulatory Visit (HOSPITAL_COMMUNITY)
Admission: RE | Admit: 2023-01-19 | Discharge: 2023-01-19 | Disposition: A | Discharge status: Home / Self Care | Payer: Medicare (Managed Care) | Source: Ambulatory Visit | Attending: Internal Medicine | Admitting: Internal Medicine

## 2023-01-19 VITALS — BP 90/50 | HR 78 | Wt 244.0 lb

## 2023-01-19 DIAGNOSIS — I2510 Atherosclerotic heart disease of native coronary artery without angina pectoris: Secondary | ICD-10-CM | POA: Insufficient documentation

## 2023-01-19 DIAGNOSIS — I472 Ventricular tachycardia, unspecified: Secondary | ICD-10-CM | POA: Insufficient documentation

## 2023-01-19 DIAGNOSIS — Z79899 Other long term (current) drug therapy: Secondary | ICD-10-CM | POA: Insufficient documentation

## 2023-01-19 DIAGNOSIS — I4819 Other persistent atrial fibrillation: Secondary | ICD-10-CM | POA: Insufficient documentation

## 2023-01-19 DIAGNOSIS — M109 Gout, unspecified: Secondary | ICD-10-CM | POA: Diagnosis not present

## 2023-01-19 DIAGNOSIS — Z952 Presence of prosthetic heart valve: Secondary | ICD-10-CM | POA: Diagnosis not present

## 2023-01-19 DIAGNOSIS — Z7901 Long term (current) use of anticoagulants: Secondary | ICD-10-CM | POA: Insufficient documentation

## 2023-01-19 DIAGNOSIS — I5022 Chronic systolic (congestive) heart failure: Secondary | ICD-10-CM | POA: Insufficient documentation

## 2023-01-19 DIAGNOSIS — I495 Sick sinus syndrome: Secondary | ICD-10-CM | POA: Insufficient documentation

## 2023-01-19 DIAGNOSIS — Z953 Presence of xenogenic heart valve: Secondary | ICD-10-CM | POA: Diagnosis not present

## 2023-01-19 DIAGNOSIS — Z95810 Presence of automatic (implantable) cardiac defibrillator: Secondary | ICD-10-CM | POA: Insufficient documentation

## 2023-01-19 DIAGNOSIS — I428 Other cardiomyopathies: Secondary | ICD-10-CM | POA: Insufficient documentation

## 2023-01-19 DIAGNOSIS — E1122 Type 2 diabetes mellitus with diabetic chronic kidney disease: Secondary | ICD-10-CM | POA: Insufficient documentation

## 2023-01-19 DIAGNOSIS — I272 Pulmonary hypertension, unspecified: Secondary | ICD-10-CM | POA: Insufficient documentation

## 2023-01-19 DIAGNOSIS — N1831 Chronic kidney disease, stage 3a: Secondary | ICD-10-CM | POA: Insufficient documentation

## 2023-01-19 DIAGNOSIS — Z7984 Long term (current) use of oral hypoglycemic drugs: Secondary | ICD-10-CM | POA: Insufficient documentation

## 2023-01-19 DIAGNOSIS — I5082 Biventricular heart failure: Secondary | ICD-10-CM | POA: Insufficient documentation

## 2023-01-19 DIAGNOSIS — I130 Hypertensive heart and chronic kidney disease with heart failure and stage 1 through stage 4 chronic kidney disease, or unspecified chronic kidney disease: Secondary | ICD-10-CM | POA: Diagnosis present

## 2023-01-19 LAB — BASIC METABOLIC PANEL
Anion gap: 9 (ref 5–15)
BUN: 23 mg/dL (ref 8–23)
CO2: 25 mmol/L (ref 22–32)
Calcium: 9 mg/dL (ref 8.9–10.3)
Chloride: 102 mmol/L (ref 98–111)
Creatinine, Ser: 1.88 mg/dL — ABNORMAL HIGH (ref 0.61–1.24)
GFR, Estimated: 40 mL/min — ABNORMAL LOW (ref >60)
Glucose, Bld: 149 mg/dL — ABNORMAL HIGH (ref 70–99)
Potassium: 4.1 mmol/L (ref 3.5–5.1)
Sodium: 136 mmol/L (ref 135–145)

## 2023-01-19 LAB — MAGNESIUM: Magnesium: 2.4 mg/dL (ref 1.7–2.4)

## 2023-01-19 LAB — BRAIN NATRIURETIC PEPTIDE: B Natriuretic Peptide: 423.8 pg/mL — ABNORMAL HIGH (ref 0.0–100.0)

## 2023-01-19 NOTE — Progress Notes (Signed)
Advanced Heart Failure Clinic Note   Date:  01/19/2023   ID:  [name], DOB [birth year], MRN [id number]  Location: Home  Provider location:  Advanced Heart Failure Clinic Type of Visit: Established patient  PCP:  Sueanne Margarita, MD  Primary Cardiologist:  [name], MD HF Cardiologist: Dr. Haroldine Laws  Chief Complaint: Heart Failure follow-up   HPI: [name] is a 62 y.o. male with h/o systolic HF due to NICM, AS s/p AVR in 4/16, pulmonary HTN, chronic AF, CKD 3 and gout.   Admitted in 5/20 with respiratory failure due to HF exacerbation and pseudomonas PNA. He was treated with antibiotics and diuretics. Echo with EF 20-25% with apical clot. Also was in/out of AF/NSR. When in NSR had episodedsof heart block   Started on anticoagulation, but he developed a GI bleed due to suspected stress ulcers.     Has seen Dr. [name] in 8/20. Remains in AF. No further pauses. Not candidate for ablation or ICD at that time  Echo 9/20 EF 25% AVR stable. Moderate RV HK   Echo 02/03/22: EF 20-25% severe LVH. AVR ok mild AI. Moderate RV dysfunction   Seen at [facility] for a/c HF, given IV lasix and down 8.5 lbs. Follow up 10/23 in AHF, off AC and Eliquis restarted. Echo here showed 10/02/22: EF 20-25% + LVH RV ok. AVR ok. + LV clot.  Admitted to [facility] 11/23 with syncope. ECG showed AF with slow VR. Echo with EF 20%, mild-mod LVH, WMA (apex), severe LAE. Aortic valve with trace to mild prosthetic AI. PASP 26. LV 7.1 cm.  He underwent LHC 10/24/2022 showing pRCA 35%, moderate diffuse disease throughout. Emboli of LM, LAD, Lcx . He refused cMRI. Eventually agreed to ICD and underwent placement of BiV ICD. Post hospital follow up with Cards at [facility], planned to repeat echo to look for improved EF after CRT-D placement.  Today he returns for HF follow up. Last Tuesday was feeling fine and felt his pacemaker speed up 2x. Went to [employer] [facility] and was told he had 27 episodes of VT.  K 4.2 Mg 2.1  Admitted and loaded with IV amio and switched to po. F/u arranged with EP (Dr. Meda Coffee). Echo done EF < 15% RV ok . Remains on po amio 200 Bid. Feels good. Walking "pretty far" without problem. No edema, orthopnea, CP or PND. Wants to go back to [employer]. Compliant with meds.       Past Medical History:  Diagnosis Date   Acute gastric ulcer with hemorrhage    Acute respiratory failure ([place]) 05/11/2019   Aortic valve disease    a. severe AI/severe AS/bicuspid AV s/p bioprosthetic aortic valve with replacement of ascending aorta 2016   Atrial flutter (HCC)    Benign hypertensive heart and renal disease    Cardiogenic shock (HCC)    Chronic combined systolic and diastolic CHF (congestive heart failure) (HCC)    Chronic kidney disease (CKD), stage III (moderate) ([place])    COVID-19    Diabetes mellitus ([place])    Essential hypertension 11/19/2014   Gout    Hx of colonic polyps 11/11/2019   Hyperlipidemia    Insomnia    Iron deficiency anemia    Murmur    Noncompliance    Obesity    Pseudomonas pneumonia ([place]) 05/16/2019   Pulmonary hypertension ([place])    Past Surgical History:  Procedure Laterality Date   AORTIC VALVE REPLACEMENT N/A 04/05/2015   Procedure: AORTIC VALVE REPLACEMENT (  AVR) using a 11m Edwards Aortic Magna Ease Valve ;  Surgeon: PIvin Poot MD;  Location: [facility]  Service: Open Heart Surgery;  Laterality: N/A;   COLONOSCOPY WITH PROPOFOL N/A 11/05/2019   Procedure: COLONOSCOPY WITH PROPOFOL;  Surgeon: GGatha Mayer MD;  [facility];  Service: Endoscopy;  Laterality: N/A;   ESOPHAGOGASTRODUODENOSCOPY (EGD) WITH PROPOFOL N/A 05/19/2019   Procedure: ESOPHAGOGASTRODUODENOSCOPY (EGD) WITH PROPOFOL;  Surgeon: GGatha Mayer MD;  [facility]  Service: Gastroenterology;  Laterality: N/A;   HEMOSTASIS CLIP PLACEMENT  05/19/2019   Procedure: HEMOSTASIS CLIP PLACEMENT;  Surgeon: GGatha Mayer MD;  [facility];  Service: Gastroenterology;;    LEFT AND RIGHT HEART CATHETERIZATION WITH CORONARY ANGIOGRAM N/A 11/27/2014   Procedure: LEFT AND RIGHT HEART CATHETERIZATION WITH CORONARY ANGIOGRAM;  Surgeon: TTroy Sine MD;  [facility];  Service: Cardiovascular;  Laterality: N/A;   MAZE N/A 04/05/2015   Procedure: MAZE;  Surgeon: PIvin Poot MD;  [facility]  Service: Open Heart Surgery;  Laterality: N/A;   POLYPECTOMY  11/05/2019   Procedure: POLYPECTOMY;  Surgeon: GGatha Mayer MD;  [facility];  Service: Endoscopy;;   REPLACEMENT ASCENDING AORTA N/A 04/05/2015   Procedure: REPLACEMENT ASCENDING AORTA with a 367mHemashield Platinum Graft;  Surgeon: PeIvin PootMD;  [facility] Service: Open Heart Surgery;  Laterality: N/A;   TEE WITHOUT CARDIOVERSION N/A 04/05/2015   Procedure: TRANSESOPHAGEAL ECHOCARDIOGRAM (TEE);  Surgeon: PeIvin PootMD;  [facility] Service: Open Heart Surgery;  Laterality: N/A;   Current Outpatient Medications  Medication Sig Dispense Refill   amiodarone (PACERONE) 200 MG tablet Take 400 mg by mouth 2 (two) times daily.     apixaban (ELIQUIS) 5 MG TABS tablet Take 1 tablet (5 mg total) by mouth 2 (two) times daily. 60 tablet 11   empagliflozin (JARDIANCE) 10 MG TABS tablet Take 1 tablet (10 mg total) by mouth daily. TAKE 1 TABLET BY MOUTH DAILY **DISCONTINUE FARXIGA** 90 tablet 3   furosemide (LASIX) 40 MG tablet Take 2 tablets (80 mg total) by mouth daily. 60 tablet 8   hydrALAZINE (APRESOLINE) 25 MG tablet Take 1 tablet (25 mg total) by mouth 3 (three) times daily. 270 tablet 2   isosorbide mononitrate (IMDUR) 60 MG 24 hr tablet Take 1 tablet by mouth once daily 90 tablet 3   rosuvastatin (CRESTOR) 20 MG tablet Take 20 mg by mouth daily.     sacubitril-valsartan (ENTRESTO) 97-103 MG TAKE ONE TABLET BY MOUTH TWICE A DAY. 180 tablet 3   No current facility-administered medications for this encounter.    Allergies:   Patient has no known allergies.    Social History:  The patient  reports that he has never smoked. He has never used smokeless tobacco. He reports current alcohol use. He reports that he does not use drugs.   Family History:  The patient's family history includes Liver disease in his cousin; Other in his mother.   ROS:  Please see the history of present illness.   All other systems are personally reviewed and negative.   Recent Labs: 10/02/2022: ALT 17; Hemoglobin 16.1; Platelets 289 01/19/2023: B Natriuretic Peptide 423.8; BUN 23; Creatinine, Ser 1.88; Magnesium 2.4; Potassium 4.1; Sodium 136  Personally reviewed   Wt Readings from Last 3 Encounters:  01/19/23 110.7 kg (244 lb)  01/02/23 109.8 kg (242 lb)  10/02/22 113.1 kg (249 lb 6.4 oz)   BP (![name]  90/50   Pulse 78   Wt 110.7 kg (244 lb)   SpO2 96%   BMI 33.09 kg/m   Physical Exam General:  Well appearing. No resp difficulty HEENT: normal Neck: supple. no JVD. Carotids 2+ bilat; no bruits. No lymphadenopathy or thryomegaly appreciated. Cor: PMI nondisplaced. Regular rate & rhythm. No rubs, gallops or murmurs. Lungs: clear Abdomen: soft, nontender, nondistended. No hepatosplenomegaly. No bruits or masses. Good bowel sounds. Extremities: no cyanosis, clubbing, rash, edema Neuro: alert & orientedx3, cranial nerves grossly intact. moves all 4 extremities w/o difficulty. Affect pleasant   MDT interrogation: 2 episodes NSVT since last check. No sustained VT volume ok Personally reviewed  ASSESSMENT AND PLAN:  1.  Chronic systolic HF with biventricular failure - Echo 12/19 EF 50% - Echo 05/11/19 EF 20-25% with moderately decreased RV function  - Due to NICM (coronaries normal in 2016). Suspect due to probable tachy-induced CM (Was in AFL in 140s on admit) - Echo 9/20 EF 25% - Echo 02/03/22: EF 20-25% severe LVH. AVR ok mild AI. Moderate RV dysfunction  - Echo 10/02/22: EF 20-25% + LVH RV ok. AVR ok. + LV clot  - Echo (11/23 at [facility]): EF 20% - LHC (11/23 at  [facility]) showing pRCA 35%, moderate diffuse disease throughout. Emboli of LM, LAD, Lcx . - Echo 1/24 at [facility] EF <15% - now s/p BiV ICD.  - NYHA II today. Volume ok.  - Continue Lasix 80 mg daily. - Continue Entresto 97/103 mg bid. - Continue hydralazine 25 mg tid + Imdur 60 mg daily. - Continue Jardiance 10 mg daily. - Off spiro with elevated SCr. No BP room to add back today.  - Off beta blocker with concerns for low output during last admission. No BP room to add back today. - Now primarily following at [facility] with Dr. Marlowe Sax as well as EP - With worsening EF and now VT I worry cardiomyopathy may be progressing. Will schedule CPX to further stratify for advanced therapies but he says he is currently uninterested in having his chest opened again   2. VT - ICD interrogation as above - amio started 1/24 at [facility]. Continue 200 bid x 2 weeks then 200 daily - following with EP at [facility]  - check labs today  3. Bioprosthetic AVR in 4/16 - Reminded of SBE prophylaxis - Stable on echo   3. Persitent AFL/ AFIB with tachy-brady syndrome - Has failed previous Maze. Not felt to be candidate for ablation - In sinus today - Continue Eliquis 5 mg bid. - Sleep study has been ordered but he never completed.  4. LV clot - Seen on echo 10/23, not seen on echo 11/23. - Continue Eliquis .  5. CAD - LHC 2016: normal cors - LHC (11/23) with pRCA 35%, moderate diffuse disease throughout. Emboli of LM, LAD, Lcx . - No s/s angina - Continue statin. - Plan to add back beta blocker when able. - No ASA with Eliquis  6. CKD 3a - Baseline creatinine 1.5-1.8 - Continue SGLT2i - Labs today.  7. Pulmonary HTN - Likely WHO group 2&3   Total time spent 45 minutes. Over half that time spent discussing above.    Signed, Jonathon Bickers, MD  01/19/2023 6:39 PM  Advanced Heart Failure [place] [address] Heart and [place] [address] [phone]  (office) [phone] (fax)

## 2023-01-19 NOTE — Patient Instructions (Signed)
There has been no changes to your medications.  Labs done today, your results will be available in MyChart, we will contact you for abnormal readings.  Your physician has recommended that you have a cardiopulmonary stress test (CPX). CPX testing is a non-invasive measurement of heart and lung function. It replaces a traditional treadmill stress test. This type of test provides a tremendous amount of information that relates not only to your present condition but also for future outcomes. This test combines measurements of you ventilation, respiratory gas exchange in the lungs, electrocardiogram (EKG), blood pressure and physical response before, during, and following an exercise protocol.  Your physician recommends that you schedule a follow-up appointment in: 3 months (May 2024)  ** please call the office in March to arrange your follow up appointment. **  If you have any questions or concerns before your next appointment please send [place] a message through [place] or call our office at [phone].    TO LEAVE A MESSAGE FOR THE NURSE SELECT OPTION 2, PLEASE LEAVE A MESSAGE INCLUDING: YOUR NAME DATE OF BIRTH CALL BACK NUMBER REASON FOR CALL**this is important as we prioritize the call backs  YOU WILL RECEIVE A CALL BACK THE SAME DAY AS LONG AS YOU CALL BEFORE 4:00 PM  At the [facility], you and your health needs are our priority. As part of our continuing mission to provide you with exceptional heart care, we have created designated Provider Care Teams. These Care Teams include your primary Cardiologist (physician) and Advanced Practice Providers (APPs- Physician Assistants and Nurse Practitioners) who all work together to provide you with the care you need, when you need it.   You may see any of the following providers on your designated Care Team at your next follow up: Dr Glori Bickers Dr Loralie Champagne Dr. Roxana Hires, NP Lyda Jester, [place] [facility] [place], [place] Forest[place] Na, NP Audry Riles, PharmD   Please be sure to bring in all your medications bottles to every appointment.    Thank you for choosing [facility]

## 2023-01-25 ENCOUNTER — Telehealth (HOSPITAL_COMMUNITY): Payer: Self-pay

## 2023-01-25 NOTE — Telephone Encounter (Signed)
Yes. Amoxicillin 2 g x 1. Take 30 to 60 mg prior to procedure

## 2023-01-25 NOTE — Telephone Encounter (Signed)
They dont have it in the 2g dose. Do you want '500mg'$ ?

## 2023-01-25 NOTE — Telephone Encounter (Signed)
Patient is having a dental cleaning on Tuesday and he wants to know if he needs any abx?

## 2023-01-26 MED ORDER — AMOXICILLIN 500 MG PO CAPS: 2000.0000 mg | ORAL_CAPSULE | Freq: Once | ORAL | 0 refills | Status: AC

## 2023-01-26 MED ORDER — ROSUVASTATIN CALCIUM 20 MG PO TABS: 20.0000 mg | ORAL_TABLET | Freq: Every day | ORAL | 3 refills | Status: AC

## 2023-01-26 NOTE — Addendum Note (Signed)
Addended by: Shela Nevin R on: [id number] 03:08 PM   Modules accepted: Orders

## 2023-01-26 NOTE — Telephone Encounter (Signed)
Rx sent, patient advised 

## 2023-02-19 ENCOUNTER — Encounter (HOSPITAL_COMMUNITY): Payer: Medicare (Managed Care)

## 2023-03-14 ENCOUNTER — Encounter (HOSPITAL_COMMUNITY): Payer: Medicare (Managed Care)

## 2023-06-04 ENCOUNTER — Other Ambulatory Visit: Payer: Self-pay
